# Patient Record
Sex: Female | Born: 1945 | Race: Black or African American | Hispanic: No | Smoking: Former smoker
Health system: Southern US, Community
[De-identification: ages and names within clinical notes are randomized; demographics above are authoritative.]

## PROBLEM LIST (undated history)

## (undated) DIAGNOSIS — N183 Chronic kidney disease, stage 3 unspecified: Secondary | ICD-10-CM

## (undated) DIAGNOSIS — I82409 Acute embolism and thrombosis of unspecified deep veins of unspecified lower extremity: Secondary | ICD-10-CM

## (undated) DIAGNOSIS — Z960 Presence of urogenital implants: Secondary | ICD-10-CM

## (undated) DIAGNOSIS — M109 Gout, unspecified: Secondary | ICD-10-CM

## (undated) DIAGNOSIS — R32 Unspecified urinary incontinence: Secondary | ICD-10-CM

## (undated) DIAGNOSIS — I2699 Other pulmonary embolism without acute cor pulmonale: Secondary | ICD-10-CM

## (undated) DIAGNOSIS — Z978 Presence of other specified devices: Secondary | ICD-10-CM

## (undated) DIAGNOSIS — J189 Pneumonia, unspecified organism: Secondary | ICD-10-CM

## (undated) DIAGNOSIS — R011 Cardiac murmur, unspecified: Secondary | ICD-10-CM

## (undated) DIAGNOSIS — G629 Polyneuropathy, unspecified: Secondary | ICD-10-CM

## (undated) DIAGNOSIS — E785 Hyperlipidemia, unspecified: Secondary | ICD-10-CM

## (undated) DIAGNOSIS — I509 Heart failure, unspecified: Secondary | ICD-10-CM

## (undated) DIAGNOSIS — G43909 Migraine, unspecified, not intractable, without status migrainosus: Secondary | ICD-10-CM

## (undated) DIAGNOSIS — D649 Anemia, unspecified: Secondary | ICD-10-CM

## (undated) DIAGNOSIS — F32A Depression, unspecified: Secondary | ICD-10-CM

## (undated) DIAGNOSIS — I890 Lymphedema, not elsewhere classified: Secondary | ICD-10-CM

## (undated) DIAGNOSIS — M1990 Unspecified osteoarthritis, unspecified site: Secondary | ICD-10-CM

## (undated) DIAGNOSIS — G4730 Sleep apnea, unspecified: Secondary | ICD-10-CM

## (undated) DIAGNOSIS — F329 Major depressive disorder, single episode, unspecified: Secondary | ICD-10-CM

## (undated) DIAGNOSIS — I10 Essential (primary) hypertension: Secondary | ICD-10-CM

## (undated) DIAGNOSIS — Z9289 Personal history of other medical treatment: Secondary | ICD-10-CM

## (undated) DIAGNOSIS — K219 Gastro-esophageal reflux disease without esophagitis: Secondary | ICD-10-CM

## (undated) HISTORY — DX: Depression, unspecified: F32.A

## (undated) HISTORY — PX: SHOULDER OPEN ROTATOR CUFF REPAIR: SHX2407

## (undated) HISTORY — DX: Unspecified osteoarthritis, unspecified site: M19.90

## (undated) HISTORY — DX: Sleep apnea, unspecified: G47.30

## (undated) HISTORY — DX: Hyperlipidemia, unspecified: E78.5

## (undated) HISTORY — PX: KNEE ARTHROSCOPY: SHX127

## (undated) HISTORY — DX: Major depressive disorder, single episode, unspecified: F32.9

## (undated) HISTORY — PX: TEMPORAL ARTERY BIOPSY / LIGATION: SUR132

---

## 1997-12-03 ENCOUNTER — Encounter: Admission: RE | Admit: 1997-12-03 | Discharge: 1998-03-03 | Payer: Self-pay | Admitting: Orthopedic Surgery

## 1998-11-23 ENCOUNTER — Other Ambulatory Visit: Admission: RE | Admit: 1998-11-23 | Discharge: 1998-11-23 | Payer: Self-pay | Admitting: Obstetrics and Gynecology

## 1998-12-02 ENCOUNTER — Emergency Department (HOSPITAL_COMMUNITY): Admission: EM | Admit: 1998-12-02 | Discharge: 1998-12-02 | Payer: Self-pay | Admitting: Emergency Medicine

## 1998-12-02 ENCOUNTER — Encounter: Payer: Self-pay | Admitting: Emergency Medicine

## 1998-12-21 ENCOUNTER — Ambulatory Visit (HOSPITAL_COMMUNITY): Admission: RE | Admit: 1998-12-21 | Discharge: 1998-12-21 | Payer: Self-pay | Admitting: Internal Medicine

## 1998-12-21 ENCOUNTER — Encounter: Payer: Self-pay | Admitting: Internal Medicine

## 1999-01-09 ENCOUNTER — Ambulatory Visit (HOSPITAL_COMMUNITY): Admission: RE | Admit: 1999-01-09 | Discharge: 1999-01-09 | Payer: Self-pay | Admitting: Obstetrics and Gynecology

## 1999-01-09 ENCOUNTER — Encounter: Payer: Self-pay | Admitting: Obstetrics and Gynecology

## 1999-02-14 ENCOUNTER — Emergency Department (HOSPITAL_COMMUNITY): Admission: EM | Admit: 1999-02-14 | Discharge: 1999-02-14 | Payer: Self-pay | Admitting: Emergency Medicine

## 1999-05-12 ENCOUNTER — Ambulatory Visit (HOSPITAL_COMMUNITY): Admission: RE | Admit: 1999-05-12 | Discharge: 1999-05-12 | Payer: Self-pay | Admitting: *Deleted

## 1999-07-27 ENCOUNTER — Ambulatory Visit (HOSPITAL_COMMUNITY): Admission: RE | Admit: 1999-07-27 | Discharge: 1999-07-27 | Payer: Self-pay | Admitting: *Deleted

## 1999-07-27 ENCOUNTER — Encounter: Payer: Self-pay | Admitting: *Deleted

## 2000-01-15 ENCOUNTER — Encounter: Payer: Self-pay | Admitting: Orthopedic Surgery

## 2000-01-16 ENCOUNTER — Ambulatory Visit (HOSPITAL_COMMUNITY): Admission: RE | Admit: 2000-01-16 | Discharge: 2000-01-17 | Payer: Self-pay | Admitting: Orthopedic Surgery

## 2000-12-17 ENCOUNTER — Encounter: Admission: RE | Admit: 2000-12-17 | Discharge: 2000-12-17 | Payer: Self-pay | Admitting: Obstetrics & Gynecology

## 2000-12-24 ENCOUNTER — Ambulatory Visit (HOSPITAL_COMMUNITY): Admission: RE | Admit: 2000-12-24 | Discharge: 2000-12-24 | Payer: Self-pay | Admitting: Obstetrics & Gynecology

## 2002-11-15 ENCOUNTER — Encounter: Payer: Self-pay | Admitting: Emergency Medicine

## 2002-11-15 ENCOUNTER — Inpatient Hospital Stay (HOSPITAL_COMMUNITY): Admission: EM | Admit: 2002-11-15 | Discharge: 2002-11-17 | Payer: Self-pay | Admitting: Emergency Medicine

## 2002-11-16 ENCOUNTER — Encounter (INDEPENDENT_AMBULATORY_CARE_PROVIDER_SITE_OTHER): Payer: Self-pay | Admitting: *Deleted

## 2002-12-10 ENCOUNTER — Inpatient Hospital Stay (HOSPITAL_COMMUNITY): Admission: AD | Admit: 2002-12-10 | Discharge: 2002-12-14 | Payer: Self-pay | Admitting: Internal Medicine

## 2003-04-16 ENCOUNTER — Ambulatory Visit (HOSPITAL_COMMUNITY): Admission: RE | Admit: 2003-04-16 | Discharge: 2003-04-16 | Payer: Self-pay | Admitting: *Deleted

## 2003-05-06 ENCOUNTER — Inpatient Hospital Stay (HOSPITAL_COMMUNITY): Admission: EM | Admit: 2003-05-06 | Discharge: 2003-05-15 | Payer: Self-pay | Admitting: Emergency Medicine

## 2003-05-06 ENCOUNTER — Encounter: Payer: Self-pay | Admitting: Emergency Medicine

## 2003-05-07 ENCOUNTER — Encounter: Payer: Self-pay | Admitting: Cardiology

## 2003-05-08 ENCOUNTER — Encounter: Payer: Self-pay | Admitting: *Deleted

## 2003-05-18 ENCOUNTER — Ambulatory Visit (HOSPITAL_COMMUNITY): Admission: RE | Admit: 2003-05-18 | Discharge: 2003-05-18 | Payer: Self-pay | Admitting: *Deleted

## 2004-10-11 ENCOUNTER — Emergency Department (HOSPITAL_COMMUNITY): Admission: EM | Admit: 2004-10-11 | Discharge: 2004-10-11 | Payer: Self-pay | Admitting: Emergency Medicine

## 2005-03-16 ENCOUNTER — Encounter (INDEPENDENT_AMBULATORY_CARE_PROVIDER_SITE_OTHER): Payer: Self-pay | Admitting: *Deleted

## 2005-03-16 ENCOUNTER — Ambulatory Visit (HOSPITAL_COMMUNITY): Admission: RE | Admit: 2005-03-16 | Discharge: 2005-03-16 | Payer: Self-pay | Admitting: Surgery

## 2005-03-22 ENCOUNTER — Ambulatory Visit (HOSPITAL_COMMUNITY): Admission: RE | Admit: 2005-03-22 | Discharge: 2005-03-22 | Payer: Self-pay | Admitting: Surgery

## 2005-04-01 ENCOUNTER — Emergency Department (HOSPITAL_COMMUNITY): Admission: EM | Admit: 2005-04-01 | Discharge: 2005-04-01 | Payer: Self-pay | Admitting: Emergency Medicine

## 2005-04-14 ENCOUNTER — Emergency Department (HOSPITAL_COMMUNITY): Admission: EM | Admit: 2005-04-14 | Discharge: 2005-04-14 | Payer: Self-pay | Admitting: Emergency Medicine

## 2005-04-27 ENCOUNTER — Encounter (HOSPITAL_BASED_OUTPATIENT_CLINIC_OR_DEPARTMENT_OTHER): Admission: RE | Admit: 2005-04-27 | Discharge: 2005-06-19 | Payer: Self-pay | Admitting: Surgery

## 2005-05-10 ENCOUNTER — Inpatient Hospital Stay (HOSPITAL_COMMUNITY): Admission: EM | Admit: 2005-05-10 | Discharge: 2005-05-13 | Payer: Self-pay | Admitting: Emergency Medicine

## 2005-07-16 ENCOUNTER — Ambulatory Visit (HOSPITAL_COMMUNITY): Admission: RE | Admit: 2005-07-16 | Discharge: 2005-07-16 | Payer: Self-pay | Admitting: Internal Medicine

## 2006-01-10 ENCOUNTER — Ambulatory Visit: Payer: Self-pay | Admitting: Internal Medicine

## 2006-01-23 ENCOUNTER — Ambulatory Visit: Payer: Self-pay | Admitting: Internal Medicine

## 2006-01-28 ENCOUNTER — Inpatient Hospital Stay (HOSPITAL_COMMUNITY): Admission: AD | Admit: 2006-01-28 | Discharge: 2006-02-01 | Payer: Self-pay | Admitting: Internal Medicine

## 2006-01-28 ENCOUNTER — Ambulatory Visit: Payer: Self-pay | Admitting: Internal Medicine

## 2006-04-26 ENCOUNTER — Ambulatory Visit: Payer: Self-pay | Admitting: Internal Medicine

## 2008-05-20 ENCOUNTER — Ambulatory Visit: Payer: Self-pay | Admitting: Internal Medicine

## 2008-05-20 ENCOUNTER — Inpatient Hospital Stay (HOSPITAL_COMMUNITY): Admission: EM | Admit: 2008-05-20 | Discharge: 2008-05-28 | Payer: Self-pay | Admitting: Emergency Medicine

## 2008-05-24 ENCOUNTER — Encounter (INDEPENDENT_AMBULATORY_CARE_PROVIDER_SITE_OTHER): Payer: Self-pay | Admitting: Internal Medicine

## 2008-05-25 ENCOUNTER — Ambulatory Visit: Payer: Self-pay | Admitting: Vascular Surgery

## 2008-05-25 ENCOUNTER — Encounter (INDEPENDENT_AMBULATORY_CARE_PROVIDER_SITE_OTHER): Payer: Self-pay | Admitting: Internal Medicine

## 2008-06-25 ENCOUNTER — Emergency Department (HOSPITAL_COMMUNITY): Admission: EM | Admit: 2008-06-25 | Discharge: 2008-06-25 | Payer: Self-pay | Admitting: Emergency Medicine

## 2008-11-02 ENCOUNTER — Inpatient Hospital Stay (HOSPITAL_COMMUNITY): Admission: EM | Admit: 2008-11-02 | Discharge: 2008-11-12 | Payer: Self-pay | Admitting: Emergency Medicine

## 2008-11-03 ENCOUNTER — Encounter (INDEPENDENT_AMBULATORY_CARE_PROVIDER_SITE_OTHER): Payer: Self-pay | Admitting: Internal Medicine

## 2008-11-03 ENCOUNTER — Ambulatory Visit: Payer: Self-pay | Admitting: Vascular Surgery

## 2008-12-09 ENCOUNTER — Ambulatory Visit: Payer: Self-pay | Admitting: Emergency Medicine

## 2008-12-09 DIAGNOSIS — E785 Hyperlipidemia, unspecified: Secondary | ICD-10-CM

## 2008-12-09 DIAGNOSIS — G4730 Sleep apnea, unspecified: Secondary | ICD-10-CM | POA: Insufficient documentation

## 2008-12-09 DIAGNOSIS — I2789 Other specified pulmonary heart diseases: Secondary | ICD-10-CM | POA: Insufficient documentation

## 2008-12-09 DIAGNOSIS — I10 Essential (primary) hypertension: Secondary | ICD-10-CM

## 2008-12-22 ENCOUNTER — Ambulatory Visit: Payer: Self-pay | Admitting: Pulmonary Disease

## 2008-12-22 ENCOUNTER — Ambulatory Visit (HOSPITAL_BASED_OUTPATIENT_CLINIC_OR_DEPARTMENT_OTHER): Admission: RE | Admit: 2008-12-22 | Discharge: 2008-12-22 | Payer: Self-pay | Admitting: Emergency Medicine

## 2009-01-22 ENCOUNTER — Inpatient Hospital Stay (HOSPITAL_COMMUNITY): Admission: EM | Admit: 2009-01-22 | Discharge: 2009-01-25 | Payer: Self-pay | Admitting: Emergency Medicine

## 2009-01-25 ENCOUNTER — Ambulatory Visit: Payer: Self-pay | Admitting: Vascular Surgery

## 2009-01-25 ENCOUNTER — Encounter (INDEPENDENT_AMBULATORY_CARE_PROVIDER_SITE_OTHER): Payer: Self-pay | Admitting: Internal Medicine

## 2009-01-28 ENCOUNTER — Ambulatory Visit: Payer: Self-pay | Admitting: Emergency Medicine

## 2009-01-30 ENCOUNTER — Telehealth: Payer: Self-pay | Admitting: Emergency Medicine

## 2009-02-03 ENCOUNTER — Telehealth (INDEPENDENT_AMBULATORY_CARE_PROVIDER_SITE_OTHER): Payer: Self-pay | Admitting: *Deleted

## 2009-09-22 ENCOUNTER — Emergency Department (HOSPITAL_COMMUNITY): Admission: EM | Admit: 2009-09-22 | Discharge: 2009-09-22 | Payer: Self-pay | Admitting: Emergency Medicine

## 2009-10-31 ENCOUNTER — Encounter: Admission: RE | Admit: 2009-10-31 | Discharge: 2009-10-31 | Payer: Self-pay | Admitting: Geriatric Medicine

## 2009-11-08 ENCOUNTER — Inpatient Hospital Stay (HOSPITAL_COMMUNITY): Admission: EM | Admit: 2009-11-08 | Discharge: 2009-11-10 | Payer: Self-pay | Admitting: Emergency Medicine

## 2009-11-08 ENCOUNTER — Encounter (INDEPENDENT_AMBULATORY_CARE_PROVIDER_SITE_OTHER): Payer: Self-pay | Admitting: Internal Medicine

## 2009-11-08 ENCOUNTER — Ambulatory Visit: Payer: Self-pay | Admitting: Surgery

## 2009-11-14 ENCOUNTER — Observation Stay (HOSPITAL_COMMUNITY): Admission: EM | Admit: 2009-11-14 | Discharge: 2009-11-18 | Payer: Self-pay | Admitting: Emergency Medicine

## 2009-11-15 ENCOUNTER — Encounter (INDEPENDENT_AMBULATORY_CARE_PROVIDER_SITE_OTHER): Payer: Self-pay | Admitting: Internal Medicine

## 2010-05-14 ENCOUNTER — Observation Stay (HOSPITAL_COMMUNITY): Admission: EM | Admit: 2010-05-14 | Discharge: 2010-05-17 | Payer: Self-pay | Admitting: Emergency Medicine

## 2010-05-14 ENCOUNTER — Ambulatory Visit: Payer: Self-pay | Admitting: Family Medicine

## 2010-05-15 ENCOUNTER — Encounter: Payer: Self-pay | Admitting: Family Medicine

## 2010-08-01 ENCOUNTER — Inpatient Hospital Stay (HOSPITAL_COMMUNITY)
Admission: EM | Admit: 2010-08-01 | Discharge: 2010-08-05 | Payer: Self-pay | Source: Home / Self Care | Attending: Internal Medicine | Admitting: Internal Medicine

## 2010-09-17 ENCOUNTER — Encounter: Payer: Self-pay | Admitting: Geriatric Medicine

## 2010-11-06 LAB — GLUCOSE, CAPILLARY
Glucose-Capillary: 126 mg/dL — ABNORMAL HIGH (ref 70–99)
Glucose-Capillary: 130 mg/dL — ABNORMAL HIGH (ref 70–99)
Glucose-Capillary: 134 mg/dL — ABNORMAL HIGH (ref 70–99)
Glucose-Capillary: 136 mg/dL — ABNORMAL HIGH (ref 70–99)
Glucose-Capillary: 185 mg/dL — ABNORMAL HIGH (ref 70–99)
Glucose-Capillary: 197 mg/dL — ABNORMAL HIGH (ref 70–99)
Glucose-Capillary: 198 mg/dL — ABNORMAL HIGH (ref 70–99)
Glucose-Capillary: 82 mg/dL (ref 70–99)
Glucose-Capillary: 97 mg/dL (ref 70–99)

## 2010-11-06 LAB — HEMOGLOBIN A1C: Hgb A1c MFr Bld: 6.3 % — ABNORMAL HIGH (ref <5.7)

## 2010-11-06 LAB — URINALYSIS, ROUTINE W REFLEX MICROSCOPIC
Glucose, UA: NEGATIVE mg/dL
Specific Gravity, Urine: 1.019 (ref 1.005–1.030)

## 2010-11-06 LAB — BASIC METABOLIC PANEL
BUN: 25 mg/dL — ABNORMAL HIGH (ref 6–23)
CO2: 22 mEq/L (ref 19–32)
Calcium: 7.7 mg/dL — ABNORMAL LOW (ref 8.4–10.5)
Calcium: 7.8 mg/dL — ABNORMAL LOW (ref 8.4–10.5)
Calcium: 8.1 mg/dL — ABNORMAL LOW (ref 8.4–10.5)
Chloride: 105 mEq/L (ref 96–112)
Chloride: 107 mEq/L (ref 96–112)
Creatinine, Ser: 1.73 mg/dL — ABNORMAL HIGH (ref 0.4–1.2)
Creatinine, Ser: 2.02 mg/dL — ABNORMAL HIGH (ref 0.4–1.2)
Creatinine, Ser: 2.23 mg/dL — ABNORMAL HIGH (ref 0.4–1.2)
GFR calc Af Amer: 27 mL/min — ABNORMAL LOW (ref >60)
GFR calc Af Amer: 30 mL/min — ABNORMAL LOW (ref >60)
GFR calc Af Amer: 36 mL/min — ABNORMAL LOW (ref >60)
GFR calc non Af Amer: 25 mL/min — ABNORMAL LOW (ref >60)
GFR calc non Af Amer: 30 mL/min — ABNORMAL LOW (ref >60)
Glucose, Bld: 128 mg/dL — ABNORMAL HIGH (ref 70–99)
Potassium: 4.4 mEq/L (ref 3.5–5.1)
Sodium: 138 mEq/L (ref 135–145)
Sodium: 139 mEq/L (ref 135–145)

## 2010-11-06 LAB — CBC
HCT: 25.4 % — ABNORMAL LOW (ref 36.0–46.0)
Hemoglobin: 7.9 g/dL — ABNORMAL LOW (ref 12.0–15.0)
MCH: 27.3 pg (ref 26.0–34.0)
MCHC: 31.9 g/dL (ref 30.0–36.0)
MCV: 86.1 fL (ref 78.0–100.0)
Platelets: 191 10*3/uL (ref 150–400)
Platelets: 222 10*3/uL (ref 150–400)
Platelets: 230 10*3/uL (ref 150–400)
RBC: 2.89 MIL/uL — ABNORMAL LOW (ref 3.87–5.11)
RBC: 2.9 MIL/uL — ABNORMAL LOW (ref 3.87–5.11)
RDW: 14.3 % (ref 11.5–15.5)
RDW: 14.5 % (ref 11.5–15.5)
RDW: 14.6 % (ref 11.5–15.5)
WBC: 10.4 10*3/uL (ref 4.0–10.5)
WBC: 16.6 10*3/uL — ABNORMAL HIGH (ref 4.0–10.5)
WBC: 9.6 10*3/uL (ref 4.0–10.5)

## 2010-11-06 LAB — CULTURE, BLOOD (ROUTINE X 2): Culture: NO GROWTH

## 2010-11-06 LAB — COMPREHENSIVE METABOLIC PANEL
ALT: 29 U/L (ref 0–35)
Calcium: 7.3 mg/dL — ABNORMAL LOW (ref 8.4–10.5)
Creatinine, Ser: 2.1 mg/dL — ABNORMAL HIGH (ref 0.4–1.2)
GFR calc Af Amer: 29 mL/min — ABNORMAL LOW (ref >60)
Glucose, Bld: 129 mg/dL — ABNORMAL HIGH (ref 70–99)
Sodium: 136 mEq/L (ref 135–145)
Total Protein: 6.1 g/dL (ref 6.0–8.3)

## 2010-11-06 LAB — URINE MICROSCOPIC-ADD ON

## 2010-11-06 LAB — DIFFERENTIAL
Basophils Absolute: 0 10*3/uL (ref 0.0–0.1)
Eosinophils Relative: 2 % (ref 0–5)
Lymphocytes Relative: 11 % — ABNORMAL LOW (ref 12–46)

## 2010-11-06 LAB — URINE CULTURE

## 2010-11-06 LAB — MAGNESIUM: Magnesium: 1.4 mg/dL — ABNORMAL LOW (ref 1.5–2.5)

## 2010-11-09 LAB — BASIC METABOLIC PANEL
BUN: 44 mg/dL — ABNORMAL HIGH (ref 6–23)
CO2: 22 mEq/L (ref 19–32)
CO2: 22 mEq/L (ref 19–32)
CO2: 22 mEq/L (ref 19–32)
Calcium: 8.7 mg/dL (ref 8.4–10.5)
Calcium: 8.9 mg/dL (ref 8.4–10.5)
Calcium: 9 mg/dL (ref 8.4–10.5)
Chloride: 112 mEq/L (ref 96–112)
Chloride: 114 mEq/L — ABNORMAL HIGH (ref 96–112)
Creatinine, Ser: 1.24 mg/dL — ABNORMAL HIGH (ref 0.4–1.2)
Creatinine, Ser: 1.34 mg/dL — ABNORMAL HIGH (ref 0.4–1.2)
Creatinine, Ser: 1.72 mg/dL — ABNORMAL HIGH (ref 0.4–1.2)
GFR calc Af Amer: 38 mL/min — ABNORMAL LOW (ref >60)
GFR calc Af Amer: 48 mL/min — ABNORMAL LOW (ref >60)
GFR calc non Af Amer: 30 mL/min — ABNORMAL LOW (ref >60)
GFR calc non Af Amer: 40 mL/min — ABNORMAL LOW (ref >60)
GFR calc non Af Amer: 44 mL/min — ABNORMAL LOW (ref >60)
Glucose, Bld: 75 mg/dL (ref 70–99)
Sodium: 137 mEq/L (ref 135–145)
Sodium: 138 mEq/L (ref 135–145)
Sodium: 138 mEq/L (ref 135–145)

## 2010-11-09 LAB — VITAMIN D 25 HYDROXY (VIT D DEFICIENCY, FRACTURES): Vit D, 25-Hydroxy: 25 ng/mL — ABNORMAL LOW (ref 30–89)

## 2010-11-09 LAB — CBC
HCT: 32.7 % — ABNORMAL LOW (ref 36.0–46.0)
Hemoglobin: 10.5 g/dL — ABNORMAL LOW (ref 12.0–15.0)
Hemoglobin: 10.9 g/dL — ABNORMAL LOW (ref 12.0–15.0)
MCH: 27.3 pg (ref 26.0–34.0)
MCH: 27.5 pg (ref 26.0–34.0)
MCHC: 32.1 g/dL (ref 30.0–36.0)
MCV: 84.9 fL (ref 78.0–100.0)
MCV: 86 fL (ref 78.0–100.0)
Platelets: 201 10*3/uL (ref 150–400)
Platelets: 202 10*3/uL (ref 150–400)
Platelets: 222 10*3/uL (ref 150–400)
Platelets: 222 10*3/uL (ref 150–400)
RBC: 3.84 MIL/uL — ABNORMAL LOW (ref 3.87–5.11)
RBC: 3.85 MIL/uL — ABNORMAL LOW (ref 3.87–5.11)
RBC: 3.97 MIL/uL (ref 3.87–5.11)
RDW: 15.3 % (ref 11.5–15.5)
RDW: 15.3 % (ref 11.5–15.5)
WBC: 4.3 10*3/uL (ref 4.0–10.5)
WBC: 4.8 10*3/uL (ref 4.0–10.5)
WBC: 4.9 10*3/uL (ref 4.0–10.5)
WBC: 5.1 10*3/uL (ref 4.0–10.5)

## 2010-11-09 LAB — CK TOTAL AND CKMB (NOT AT ARMC)
CK, MB: 1.8 ng/mL (ref 0.3–4.0)
Relative Index: 1.4 (ref 0.0–2.5)

## 2010-11-09 LAB — CARDIAC PANEL(CRET KIN+CKTOT+MB+TROPI)
CK, MB: 1.7 ng/mL (ref 0.3–4.0)
CK, MB: 1.7 ng/mL (ref 0.3–4.0)
Relative Index: 1.3 (ref 0.0–2.5)
Relative Index: 1.3 (ref 0.0–2.5)
Total CK: 131 U/L (ref 7–177)
Total CK: 135 U/L (ref 7–177)
Troponin I: 0.01 ng/mL (ref 0.00–0.06)

## 2010-11-09 LAB — GLUCOSE, CAPILLARY
Glucose-Capillary: 102 mg/dL — ABNORMAL HIGH (ref 70–99)
Glucose-Capillary: 111 mg/dL — ABNORMAL HIGH (ref 70–99)
Glucose-Capillary: 133 mg/dL — ABNORMAL HIGH (ref 70–99)
Glucose-Capillary: 134 mg/dL — ABNORMAL HIGH (ref 70–99)
Glucose-Capillary: 58 mg/dL — ABNORMAL LOW (ref 70–99)
Glucose-Capillary: 67 mg/dL — ABNORMAL LOW (ref 70–99)
Glucose-Capillary: 89 mg/dL (ref 70–99)
Glucose-Capillary: 96 mg/dL (ref 70–99)
Glucose-Capillary: 98 mg/dL (ref 70–99)

## 2010-11-09 LAB — HEPARIN LEVEL (UNFRACTIONATED)
Heparin Unfractionated: 0.5 IU/mL (ref 0.30–0.70)
Heparin Unfractionated: 0.68 IU/mL (ref 0.30–0.70)

## 2010-11-09 LAB — BRAIN NATRIURETIC PEPTIDE
Pro B Natriuretic peptide (BNP): 35 pg/mL (ref 0.0–100.0)
Pro B Natriuretic peptide (BNP): <30 pg/mL (ref 0.0–100.0)

## 2010-11-09 LAB — POCT CARDIAC MARKERS

## 2010-11-09 LAB — DIFFERENTIAL
Basophils Absolute: 0 10*3/uL (ref 0.0–0.1)
Eosinophils Absolute: 0.1 10*3/uL (ref 0.0–0.7)
Eosinophils Relative: 3 % (ref 0–5)
Lymphocytes Relative: 24 % (ref 12–46)
Neutrophils Relative %: 67 % (ref 43–77)

## 2010-11-09 LAB — COMPREHENSIVE METABOLIC PANEL
AST: 14 U/L (ref 0–37)
Albumin: 2.7 g/dL — ABNORMAL LOW (ref 3.5–5.2)
Alkaline Phosphatase: 83 U/L (ref 39–117)
Chloride: 116 mEq/L — ABNORMAL HIGH (ref 96–112)
Creatinine, Ser: 1.42 mg/dL — ABNORMAL HIGH (ref 0.4–1.2)
GFR calc Af Amer: 45 mL/min — ABNORMAL LOW (ref >60)
Potassium: 4.9 mEq/L (ref 3.5–5.1)
Total Bilirubin: 0.2 mg/dL — ABNORMAL LOW (ref 0.3–1.2)

## 2010-11-09 LAB — PROTIME-INR
INR: 1.11 (ref 0.00–1.49)
Prothrombin Time: 14.5 seconds (ref 11.6–15.2)

## 2010-11-09 LAB — HEMOGLOBIN A1C: Mean Plasma Glucose: 137 mg/dL — ABNORMAL HIGH (ref <117)

## 2010-11-09 LAB — D-DIMER, QUANTITATIVE: D-Dimer, Quant: 0.57 ug/mL-FEU — ABNORMAL HIGH (ref 0.00–0.48)

## 2010-11-09 LAB — LIPID PANEL
LDL Cholesterol: 70 mg/dL (ref 0–99)
Triglycerides: 100 mg/dL (ref <150)

## 2010-11-09 LAB — TROPONIN I: Troponin I: 0.02 ng/mL (ref 0.00–0.06)

## 2010-11-12 LAB — DIFFERENTIAL
Basophils Absolute: 0.1 K/uL (ref 0.0–0.1)
Basophils Relative: 2 % — ABNORMAL HIGH (ref 0–1)
Eosinophils Absolute: 0.3 K/uL (ref 0.0–0.7)
Eosinophils Relative: 4 % (ref 0–5)
Lymphocytes Relative: 16 % (ref 12–46)
Lymphs Abs: 1.1 K/uL (ref 0.7–4.0)
Monocytes Absolute: 0.3 K/uL (ref 0.1–1.0)
Monocytes Relative: 4 % (ref 3–12)
Neutro Abs: 5.4 K/uL (ref 1.7–7.7)
Neutrophils Relative %: 75 % (ref 43–77)

## 2010-11-12 LAB — CBC
HCT: 32.7 % — ABNORMAL LOW (ref 36.0–46.0)
Hemoglobin: 11 g/dL — ABNORMAL LOW (ref 12.0–15.0)
MCHC: 33.7 g/dL (ref 30.0–36.0)
MCV: 82.9 fL (ref 78.0–100.0)
Platelets: 327 K/uL (ref 150–400)
RBC: 3.94 MIL/uL (ref 3.87–5.11)
RDW: 13.6 % (ref 11.5–15.5)
WBC: 7.2 K/uL (ref 4.0–10.5)

## 2010-11-12 LAB — COMPREHENSIVE METABOLIC PANEL WITH GFR
ALT: 13 U/L (ref 0–35)
AST: 13 U/L (ref 0–37)
Albumin: 3 g/dL — ABNORMAL LOW (ref 3.5–5.2)
Alkaline Phosphatase: 82 U/L (ref 39–117)
BUN: 42 mg/dL — ABNORMAL HIGH (ref 6–23)
CO2: 24 meq/L (ref 19–32)
Calcium: 9.2 mg/dL (ref 8.4–10.5)
Chloride: 109 meq/L (ref 96–112)
Creatinine, Ser: 1.46 mg/dL — ABNORMAL HIGH (ref 0.4–1.2)
GFR calc non Af Amer: 36 mL/min — ABNORMAL LOW
Glucose, Bld: 94 mg/dL (ref 70–99)
Potassium: 4.8 meq/L (ref 3.5–5.1)
Sodium: 141 meq/L (ref 135–145)
Total Bilirubin: 0.3 mg/dL (ref 0.3–1.2)
Total Protein: 8.5 g/dL — ABNORMAL HIGH (ref 6.0–8.3)

## 2010-11-12 LAB — URINALYSIS, ROUTINE W REFLEX MICROSCOPIC
Glucose, UA: NEGATIVE mg/dL
Nitrite: NEGATIVE
Protein, ur: NEGATIVE mg/dL
Urobilinogen, UA: 0.2 mg/dL (ref 0.0–1.0)

## 2010-11-19 LAB — GLUCOSE, CAPILLARY
Glucose-Capillary: 103 mg/dL — ABNORMAL HIGH (ref 70–99)
Glucose-Capillary: 104 mg/dL — ABNORMAL HIGH (ref 70–99)
Glucose-Capillary: 108 mg/dL — ABNORMAL HIGH (ref 70–99)
Glucose-Capillary: 116 mg/dL — ABNORMAL HIGH (ref 70–99)
Glucose-Capillary: 128 mg/dL — ABNORMAL HIGH (ref 70–99)
Glucose-Capillary: 151 mg/dL — ABNORMAL HIGH (ref 70–99)
Glucose-Capillary: 153 mg/dL — ABNORMAL HIGH (ref 70–99)
Glucose-Capillary: 155 mg/dL — ABNORMAL HIGH (ref 70–99)
Glucose-Capillary: 163 mg/dL — ABNORMAL HIGH (ref 70–99)
Glucose-Capillary: 164 mg/dL — ABNORMAL HIGH (ref 70–99)
Glucose-Capillary: 172 mg/dL — ABNORMAL HIGH (ref 70–99)
Glucose-Capillary: 187 mg/dL — ABNORMAL HIGH (ref 70–99)
Glucose-Capillary: 63 mg/dL — ABNORMAL LOW (ref 70–99)
Glucose-Capillary: 66 mg/dL — ABNORMAL LOW (ref 70–99)
Glucose-Capillary: 73 mg/dL (ref 70–99)
Glucose-Capillary: 91 mg/dL (ref 70–99)
Glucose-Capillary: 97 mg/dL (ref 70–99)

## 2010-11-19 LAB — COMPREHENSIVE METABOLIC PANEL
ALT: 21 U/L (ref 0–35)
AST: 10 U/L (ref 0–37)
AST: 12 U/L (ref 0–37)
AST: 13 U/L (ref 0–37)
AST: 16 U/L (ref 0–37)
Albumin: 2.2 g/dL — ABNORMAL LOW (ref 3.5–5.2)
Albumin: 2.2 g/dL — ABNORMAL LOW (ref 3.5–5.2)
Albumin: 2.5 g/dL — ABNORMAL LOW (ref 3.5–5.2)
Alkaline Phosphatase: 87 U/L (ref 39–117)
BUN: 28 mg/dL — ABNORMAL HIGH (ref 6–23)
BUN: 34 mg/dL — ABNORMAL HIGH (ref 6–23)
BUN: 40 mg/dL — ABNORMAL HIGH (ref 6–23)
CO2: 19 mEq/L (ref 19–32)
CO2: 22 mEq/L (ref 19–32)
CO2: 22 mEq/L (ref 19–32)
Calcium: 8.1 mg/dL — ABNORMAL LOW (ref 8.4–10.5)
Calcium: 8.6 mg/dL (ref 8.4–10.5)
Calcium: 8.8 mg/dL (ref 8.4–10.5)
Chloride: 105 mEq/L (ref 96–112)
Chloride: 111 mEq/L (ref 96–112)
Chloride: 111 mEq/L (ref 96–112)
Creatinine, Ser: 1.43 mg/dL — ABNORMAL HIGH (ref 0.4–1.2)
Creatinine, Ser: 1.49 mg/dL — ABNORMAL HIGH (ref 0.4–1.2)
Creatinine, Ser: 1.57 mg/dL — ABNORMAL HIGH (ref 0.4–1.2)
Creatinine, Ser: 1.67 mg/dL — ABNORMAL HIGH (ref 0.4–1.2)
GFR calc Af Amer: 38 mL/min — ABNORMAL LOW (ref >60)
GFR calc Af Amer: 43 mL/min — ABNORMAL LOW (ref >60)
GFR calc Af Amer: 48 mL/min — ABNORMAL LOW (ref >60)
GFR calc non Af Amer: 31 mL/min — ABNORMAL LOW (ref >60)
GFR calc non Af Amer: 33 mL/min — ABNORMAL LOW (ref >60)
GFR calc non Af Amer: 37 mL/min — ABNORMAL LOW (ref >60)
Glucose, Bld: 133 mg/dL — ABNORMAL HIGH (ref 70–99)
Glucose, Bld: 64 mg/dL — ABNORMAL LOW (ref 70–99)
Potassium: 4.4 mEq/L (ref 3.5–5.1)
Sodium: 137 mEq/L (ref 135–145)
Sodium: 140 mEq/L (ref 135–145)
Total Bilirubin: 0.3 mg/dL (ref 0.3–1.2)
Total Bilirubin: 0.3 mg/dL (ref 0.3–1.2)
Total Bilirubin: 0.6 mg/dL (ref 0.3–1.2)
Total Protein: 7.3 g/dL (ref 6.0–8.3)
Total Protein: 7.3 g/dL (ref 6.0–8.3)

## 2010-11-19 LAB — CBC
HCT: 23.8 % — ABNORMAL LOW (ref 36.0–46.0)
HCT: 24.9 % — ABNORMAL LOW (ref 36.0–46.0)
HCT: 26.1 % — ABNORMAL LOW (ref 36.0–46.0)
HCT: 26.6 % — ABNORMAL LOW (ref 36.0–46.0)
HCT: 28.3 % — ABNORMAL LOW (ref 36.0–46.0)
Hemoglobin: 7.8 g/dL — ABNORMAL LOW (ref 12.0–15.0)
Hemoglobin: 8.4 g/dL — ABNORMAL LOW (ref 12.0–15.0)
Hemoglobin: 8.4 g/dL — ABNORMAL LOW (ref 12.0–15.0)
Hemoglobin: 8.5 g/dL — ABNORMAL LOW (ref 12.0–15.0)
MCHC: 32.8 g/dL (ref 30.0–36.0)
MCHC: 33.5 g/dL (ref 30.0–36.0)
MCHC: 33.8 g/dL (ref 30.0–36.0)
MCV: 81.9 fL (ref 78.0–100.0)
MCV: 82.1 fL (ref 78.0–100.0)
MCV: 82.7 fL (ref 78.0–100.0)
MCV: 83.1 fL (ref 78.0–100.0)
MCV: 83.1 fL (ref 78.0–100.0)
Platelets: 188 10*3/uL (ref 150–400)
Platelets: 345 10*3/uL (ref 150–400)
Platelets: 371 10*3/uL (ref 150–400)
Platelets: 407 10*3/uL — ABNORMAL HIGH (ref 150–400)
RBC: 2.87 MIL/uL — ABNORMAL LOW (ref 3.87–5.11)
RBC: 3.04 MIL/uL — ABNORMAL LOW (ref 3.87–5.11)
RBC: 3.11 MIL/uL — ABNORMAL LOW (ref 3.87–5.11)
RBC: 3.22 MIL/uL — ABNORMAL LOW (ref 3.87–5.11)
RBC: 3.46 MIL/uL — ABNORMAL LOW (ref 3.87–5.11)
RBC: 3.82 MIL/uL — ABNORMAL LOW (ref 3.87–5.11)
RDW: 15.1 % (ref 11.5–15.5)
RDW: 15.4 % (ref 11.5–15.5)
RDW: 15.7 % — ABNORMAL HIGH (ref 11.5–15.5)
RDW: 15.9 % — ABNORMAL HIGH (ref 11.5–15.5)
WBC: 10.1 10*3/uL (ref 4.0–10.5)
WBC: 11.3 10*3/uL — ABNORMAL HIGH (ref 4.0–10.5)
WBC: 14.5 10*3/uL — ABNORMAL HIGH (ref 4.0–10.5)
WBC: 7.9 10*3/uL (ref 4.0–10.5)
WBC: 8.9 10*3/uL (ref 4.0–10.5)
WBC: 9 10*3/uL (ref 4.0–10.5)
WBC: 9.8 10*3/uL (ref 4.0–10.5)

## 2010-11-19 LAB — POCT I-STAT, CHEM 8
BUN: 39 mg/dL — ABNORMAL HIGH (ref 6–23)
Calcium, Ion: 1.12 mmol/L (ref 1.12–1.32)
Chloride: 114 mEq/L — ABNORMAL HIGH (ref 96–112)
Creatinine, Ser: 1.6 mg/dL — ABNORMAL HIGH (ref 0.4–1.2)
Sodium: 139 mEq/L (ref 135–145)
TCO2: 20 mmol/L (ref 0–100)

## 2010-11-19 LAB — BASIC METABOLIC PANEL
BUN: 27 mg/dL — ABNORMAL HIGH (ref 6–23)
Calcium: 8 mg/dL — ABNORMAL LOW (ref 8.4–10.5)
Calcium: 8.8 mg/dL (ref 8.4–10.5)
Calcium: 8.8 mg/dL (ref 8.4–10.5)
Creatinine, Ser: 1.67 mg/dL — ABNORMAL HIGH (ref 0.4–1.2)
GFR calc Af Amer: 38 mL/min — ABNORMAL LOW (ref >60)
GFR calc Af Amer: 38 mL/min — ABNORMAL LOW (ref >60)
GFR calc non Af Amer: 31 mL/min — ABNORMAL LOW (ref >60)
GFR calc non Af Amer: 31 mL/min — ABNORMAL LOW (ref >60)
GFR calc non Af Amer: 42 mL/min — ABNORMAL LOW (ref >60)
Glucose, Bld: 84 mg/dL (ref 70–99)
Glucose, Bld: 95 mg/dL (ref 70–99)
Potassium: 4.6 mEq/L (ref 3.5–5.1)
Sodium: 139 mEq/L (ref 135–145)
Sodium: 139 mEq/L (ref 135–145)

## 2010-11-19 LAB — DIFFERENTIAL
Basophils Absolute: 0 10*3/uL (ref 0.0–0.1)
Eosinophils Absolute: 0.2 10*3/uL (ref 0.0–0.7)
Eosinophils Relative: 3 % (ref 0–5)
Eosinophils Relative: 3 % (ref 0–5)
Eosinophils Relative: 3 % (ref 0–5)
Lymphocytes Relative: 14 % (ref 12–46)
Lymphocytes Relative: 15 % (ref 12–46)
Lymphs Abs: 1.1 10*3/uL (ref 0.7–4.0)
Lymphs Abs: 1.1 10*3/uL (ref 0.7–4.0)
Lymphs Abs: 1.2 10*3/uL (ref 0.7–4.0)
Lymphs Abs: 1.3 10*3/uL (ref 0.7–4.0)
Monocytes Absolute: 0.5 10*3/uL (ref 0.1–1.0)
Monocytes Absolute: 0.8 10*3/uL (ref 0.1–1.0)
Monocytes Relative: 5 % (ref 3–12)
Monocytes Relative: 6 % (ref 3–12)
Monocytes Relative: 6 % (ref 3–12)
Neutro Abs: 12.4 10*3/uL — ABNORMAL HIGH (ref 1.7–7.7)
Neutro Abs: 6.7 10*3/uL (ref 1.7–7.7)
Neutrophils Relative %: 86 % — ABNORMAL HIGH (ref 43–77)

## 2010-11-19 LAB — BRAIN NATRIURETIC PEPTIDE: Pro B Natriuretic peptide (BNP): 40.4 pg/mL (ref 0.0–100.0)

## 2010-11-19 LAB — SYNOVIAL CELL COUNT + DIFF, W/ CRYSTALS
Lymphocytes-Synovial Fld: 9 % (ref 0–20)
Neutrophil, Synovial: 81 % — ABNORMAL HIGH (ref 0–25)
WBC, Synovial: 1025 /mm3 — ABNORMAL HIGH (ref 0–200)

## 2010-11-19 LAB — FERRITIN: Ferritin: 512 ng/mL — ABNORMAL HIGH (ref 10–291)

## 2010-11-19 LAB — HEMOGLOBIN A1C: Hgb A1c MFr Bld: 7 % — ABNORMAL HIGH (ref 4.6–6.1)

## 2010-11-19 LAB — D-DIMER, QUANTITATIVE: D-Dimer, Quant: 3.91 ug/mL-FEU — ABNORMAL HIGH (ref 0.00–0.48)

## 2010-11-19 LAB — IRON AND TIBC: UIBC: 207 ug/dL

## 2010-11-19 LAB — CARDIAC PANEL(CRET KIN+CKTOT+MB+TROPI): CK, MB: 0.6 ng/mL (ref 0.3–4.0)

## 2010-11-19 LAB — VITAMIN B12: Vitamin B-12: 454 pg/mL (ref 211–911)

## 2010-11-19 LAB — CULTURE, BLOOD (ROUTINE X 2): Culture: NO GROWTH

## 2010-11-19 LAB — TSH: TSH: 1.01 u[IU]/mL (ref 0.350–4.500)

## 2010-11-19 LAB — URIC ACID: Uric Acid, Serum: 10.8 mg/dL — ABNORMAL HIGH (ref 2.4–7.0)

## 2010-11-19 LAB — C-REACTIVE PROTEIN: CRP: 22.9 mg/dL — ABNORMAL HIGH (ref <0.6)

## 2010-11-19 LAB — BODY FLUID CULTURE

## 2010-11-19 LAB — SEDIMENTATION RATE: Sed Rate: 138 mm/hr — ABNORMAL HIGH (ref 0–22)

## 2010-12-04 LAB — GLUCOSE, CAPILLARY
Glucose-Capillary: 123 mg/dL — ABNORMAL HIGH (ref 70–99)
Glucose-Capillary: 152 mg/dL — ABNORMAL HIGH (ref 70–99)
Glucose-Capillary: 162 mg/dL — ABNORMAL HIGH (ref 70–99)

## 2010-12-05 LAB — CBC
HCT: 28.3 % — ABNORMAL LOW (ref 36.0–46.0)
Hemoglobin: 10.5 g/dL — ABNORMAL LOW (ref 12.0–15.0)
Hemoglobin: 9.5 g/dL — ABNORMAL LOW (ref 12.0–15.0)
MCHC: 33.2 g/dL (ref 30.0–36.0)
MCHC: 33.6 g/dL (ref 30.0–36.0)
MCV: 80.4 fL (ref 78.0–100.0)
Platelets: 279 10*3/uL (ref 150–400)
RBC: 3.52 MIL/uL — ABNORMAL LOW (ref 3.87–5.11)
RDW: 17 % — ABNORMAL HIGH (ref 11.5–15.5)

## 2010-12-05 LAB — URINALYSIS, ROUTINE W REFLEX MICROSCOPIC
Bilirubin Urine: NEGATIVE
Hgb urine dipstick: NEGATIVE
Nitrite: NEGATIVE
Protein, ur: NEGATIVE mg/dL
Urobilinogen, UA: 0.2 mg/dL (ref 0.0–1.0)

## 2010-12-05 LAB — GLUCOSE, CAPILLARY
Glucose-Capillary: 106 mg/dL — ABNORMAL HIGH (ref 70–99)
Glucose-Capillary: 108 mg/dL — ABNORMAL HIGH (ref 70–99)
Glucose-Capillary: 116 mg/dL — ABNORMAL HIGH (ref 70–99)
Glucose-Capillary: 125 mg/dL — ABNORMAL HIGH (ref 70–99)
Glucose-Capillary: 127 mg/dL — ABNORMAL HIGH (ref 70–99)
Glucose-Capillary: 142 mg/dL — ABNORMAL HIGH (ref 70–99)
Glucose-Capillary: 151 mg/dL — ABNORMAL HIGH (ref 70–99)
Glucose-Capillary: 175 mg/dL — ABNORMAL HIGH (ref 70–99)
Glucose-Capillary: 52 mg/dL — ABNORMAL LOW (ref 70–99)
Glucose-Capillary: 56 mg/dL — ABNORMAL LOW (ref 70–99)
Glucose-Capillary: 73 mg/dL (ref 70–99)
Glucose-Capillary: 74 mg/dL (ref 70–99)

## 2010-12-05 LAB — DIFFERENTIAL
Basophils Absolute: 0 10*3/uL (ref 0.0–0.1)
Basophils Relative: 1 % (ref 0–1)
Eosinophils Relative: 4 % (ref 0–5)
Lymphocytes Relative: 21 % (ref 12–46)
Monocytes Absolute: 0.4 10*3/uL (ref 0.1–1.0)
Neutro Abs: 3.9 10*3/uL (ref 1.7–7.7)

## 2010-12-05 LAB — POCT CARDIAC MARKERS
CKMB, poc: <1 ng/mL — ABNORMAL LOW (ref 1.0–8.0)
Myoglobin, poc: 123 ng/mL (ref 12–200)
Troponin i, poc: <0.05 ng/mL (ref 0.00–0.09)

## 2010-12-05 LAB — POCT I-STAT, CHEM 8
BUN: 18 mg/dL (ref 6–23)
Calcium, Ion: 1.1 mmol/L — ABNORMAL LOW (ref 1.12–1.32)
Chloride: 109 meq/L (ref 96–112)
Creatinine, Ser: 1.4 mg/dL — ABNORMAL HIGH (ref 0.4–1.2)
Glucose, Bld: 98 mg/dL (ref 70–99)
HCT: 33 % — ABNORMAL LOW (ref 36.0–46.0)
Hemoglobin: 11.2 g/dL — ABNORMAL LOW (ref 12.0–15.0)
Potassium: 4.3 meq/L (ref 3.5–5.1)
Sodium: 141 meq/L (ref 135–145)
TCO2: 22 mmol/L (ref 0–100)

## 2010-12-05 LAB — BASIC METABOLIC PANEL
CO2: 27 mEq/L (ref 19–32)
Calcium: 8.9 mg/dL (ref 8.4–10.5)
Chloride: 113 mEq/L — ABNORMAL HIGH (ref 96–112)
GFR calc Af Amer: 56 mL/min — ABNORMAL LOW (ref >60)
Potassium: 4.4 mEq/L (ref 3.5–5.1)
Sodium: 145 mEq/L (ref 135–145)

## 2010-12-05 LAB — TSH: TSH: 1.21 u[IU]/mL (ref 0.350–4.500)

## 2010-12-05 LAB — HEMOGLOBIN A1C
Hgb A1c MFr Bld: 5.7 % (ref 4.6–6.1)
Mean Plasma Glucose: 117 mg/dL

## 2010-12-05 LAB — SEDIMENTATION RATE: Sed Rate: 105 mm/hr — ABNORMAL HIGH (ref 0–22)

## 2010-12-05 LAB — HOMOCYSTEINE: Homocysteine: 12.2 umol/L (ref 4.0–15.4)

## 2010-12-05 LAB — BRAIN NATRIURETIC PEPTIDE: Pro B Natriuretic peptide (BNP): <30 pg/mL (ref 0.0–100.0)

## 2010-12-07 LAB — GLUCOSE, CAPILLARY
Glucose-Capillary: 106 mg/dL — ABNORMAL HIGH (ref 70–99)
Glucose-Capillary: 113 mg/dL — ABNORMAL HIGH (ref 70–99)
Glucose-Capillary: 116 mg/dL — ABNORMAL HIGH (ref 70–99)
Glucose-Capillary: 120 mg/dL — ABNORMAL HIGH (ref 70–99)
Glucose-Capillary: 126 mg/dL — ABNORMAL HIGH (ref 70–99)
Glucose-Capillary: 127 mg/dL — ABNORMAL HIGH (ref 70–99)
Glucose-Capillary: 88 mg/dL (ref 70–99)
Glucose-Capillary: 106 mg/dL — ABNORMAL HIGH (ref 70–99)
Glucose-Capillary: 107 mg/dL — ABNORMAL HIGH (ref 70–99)
Glucose-Capillary: 107 mg/dL — ABNORMAL HIGH (ref 70–99)
Glucose-Capillary: 115 mg/dL — ABNORMAL HIGH (ref 70–99)
Glucose-Capillary: 118 mg/dL — ABNORMAL HIGH (ref 70–99)
Glucose-Capillary: 123 mg/dL — ABNORMAL HIGH (ref 70–99)
Glucose-Capillary: 132 mg/dL — ABNORMAL HIGH (ref 70–99)
Glucose-Capillary: 136 mg/dL — ABNORMAL HIGH (ref 70–99)
Glucose-Capillary: 138 mg/dL — ABNORMAL HIGH (ref 70–99)
Glucose-Capillary: 147 mg/dL — ABNORMAL HIGH (ref 70–99)
Glucose-Capillary: 150 mg/dL — ABNORMAL HIGH (ref 70–99)
Glucose-Capillary: 153 mg/dL — ABNORMAL HIGH (ref 70–99)
Glucose-Capillary: 156 mg/dL — ABNORMAL HIGH (ref 70–99)
Glucose-Capillary: 169 mg/dL — ABNORMAL HIGH (ref 70–99)
Glucose-Capillary: 183 mg/dL — ABNORMAL HIGH (ref 70–99)
Glucose-Capillary: 70 mg/dL (ref 70–99)
Glucose-Capillary: 74 mg/dL (ref 70–99)
Glucose-Capillary: 81 mg/dL (ref 70–99)
Glucose-Capillary: 83 mg/dL (ref 70–99)
Glucose-Capillary: 90 mg/dL (ref 70–99)

## 2010-12-07 LAB — DIFFERENTIAL
Basophils Absolute: 0 10*3/uL (ref 0.0–0.1)
Basophils Relative: 0 % (ref 0–1)
Eosinophils Absolute: 0.1 10*3/uL (ref 0.0–0.7)
Eosinophils Relative: 1 % (ref 0–5)
Eosinophils Relative: 5 % (ref 0–5)
Lymphocytes Relative: 14 % (ref 12–46)
Lymphs Abs: 1.3 10*3/uL (ref 0.7–4.0)
Monocytes Absolute: 0.6 10*3/uL (ref 0.1–1.0)
Monocytes Absolute: 0.5 10*3/uL (ref 0.1–1.0)
Monocytes Relative: 7 % (ref 3–12)
Monocytes Relative: 7 % (ref 3–12)
Neutro Abs: 5.8 10*3/uL (ref 1.7–7.7)

## 2010-12-07 LAB — CBC
HCT: 26.8 % — ABNORMAL LOW (ref 36.0–46.0)
HCT: 27.4 % — ABNORMAL LOW (ref 36.0–46.0)
HCT: 27.7 % — ABNORMAL LOW (ref 36.0–46.0)
HCT: 30.4 % — ABNORMAL LOW (ref 36.0–46.0)
HCT: 33.8 % — ABNORMAL LOW (ref 36.0–46.0)
Hemoglobin: 9.1 g/dL — ABNORMAL LOW (ref 12.0–15.0)
Hemoglobin: 9.3 g/dL — ABNORMAL LOW (ref 12.0–15.0)
Hemoglobin: 10 g/dL — ABNORMAL LOW (ref 12.0–15.0)
Hemoglobin: 9.4 g/dL — ABNORMAL LOW (ref 12.0–15.0)
Hemoglobin: 9.6 g/dL — ABNORMAL LOW (ref 12.0–15.0)
MCHC: 33.1 g/dL (ref 30.0–36.0)
MCHC: 33.2 g/dL (ref 30.0–36.0)
MCHC: 34 g/dL (ref 30.0–36.0)
MCHC: 33.1 g/dL (ref 30.0–36.0)
MCHC: 34.6 g/dL (ref 30.0–36.0)
MCHC: 34.7 g/dL (ref 30.0–36.0)
MCV: 81.6 fL (ref 78.0–100.0)
MCV: 82 fL (ref 78.0–100.0)
MCV: 80.1 fL (ref 78.0–100.0)
MCV: 80.7 fL (ref 78.0–100.0)
MCV: 82.1 fL (ref 78.0–100.0)
MCV: 82.2 fL (ref 78.0–100.0)
MCV: 82.4 fL (ref 78.0–100.0)
Platelets: 225 10*3/uL (ref 150–400)
Platelets: 267 10*3/uL (ref 150–400)
Platelets: 272 10*3/uL (ref 150–400)
Platelets: 283 10*3/uL (ref 150–400)
Platelets: 212 10*3/uL (ref 150–400)
Platelets: 217 10*3/uL (ref 150–400)
Platelets: 233 10*3/uL (ref 150–400)
RBC: 3.24 MIL/uL — ABNORMAL LOW (ref 3.87–5.11)
RBC: 3.35 MIL/uL — ABNORMAL LOW (ref 3.87–5.11)
RBC: 3.39 MIL/uL — ABNORMAL LOW (ref 3.87–5.11)
RBC: 3.46 MIL/uL — ABNORMAL LOW (ref 3.87–5.11)
RBC: 3.56 MIL/uL — ABNORMAL LOW (ref 3.87–5.11)
RBC: 3.67 MIL/uL — ABNORMAL LOW (ref 3.87–5.11)
RBC: 4.19 MIL/uL (ref 3.87–5.11)
RDW: 14.5 % (ref 11.5–15.5)
RDW: 14.7 % (ref 11.5–15.5)
RDW: 14.9 % (ref 11.5–15.5)
RDW: 14.9 % (ref 11.5–15.5)
RDW: 15.6 % — ABNORMAL HIGH (ref 11.5–15.5)
RDW: 15.8 % — ABNORMAL HIGH (ref 11.5–15.5)
WBC: 6.4 10*3/uL (ref 4.0–10.5)
WBC: 8.4 10*3/uL (ref 4.0–10.5)
WBC: 6.7 10*3/uL (ref 4.0–10.5)
WBC: 6.8 10*3/uL (ref 4.0–10.5)
WBC: 7.1 10*3/uL (ref 4.0–10.5)
WBC: 7.8 10*3/uL (ref 4.0–10.5)

## 2010-12-07 LAB — BASIC METABOLIC PANEL
BUN: 43 mg/dL — ABNORMAL HIGH (ref 6–23)
BUN: 47 mg/dL — ABNORMAL HIGH (ref 6–23)
BUN: 52 mg/dL — ABNORMAL HIGH (ref 6–23)
BUN: 21 mg/dL (ref 6–23)
BUN: 25 mg/dL — ABNORMAL HIGH (ref 6–23)
BUN: 34 mg/dL — ABNORMAL HIGH (ref 6–23)
BUN: 36 mg/dL — ABNORMAL HIGH (ref 6–23)
CO2: 27 mEq/L (ref 19–32)
CO2: 19 mEq/L (ref 19–32)
CO2: 24 mEq/L (ref 19–32)
CO2: 25 mEq/L (ref 19–32)
Calcium: 8.2 mg/dL — ABNORMAL LOW (ref 8.4–10.5)
Calcium: 8.4 mg/dL (ref 8.4–10.5)
Calcium: 8.1 mg/dL — ABNORMAL LOW (ref 8.4–10.5)
Calcium: 8.5 mg/dL (ref 8.4–10.5)
Calcium: 8.6 mg/dL (ref 8.4–10.5)
Chloride: 102 mEq/L (ref 96–112)
Chloride: 104 mEq/L (ref 96–112)
Chloride: 103 mEq/L (ref 96–112)
Chloride: 105 mEq/L (ref 96–112)
Chloride: 118 mEq/L — ABNORMAL HIGH (ref 96–112)
Creatinine, Ser: 1.5 mg/dL — ABNORMAL HIGH (ref 0.4–1.2)
Creatinine, Ser: 1.64 mg/dL — ABNORMAL HIGH (ref 0.4–1.2)
Creatinine, Ser: 1.87 mg/dL — ABNORMAL HIGH (ref 0.4–1.2)
Creatinine, Ser: 1.17 mg/dL (ref 0.4–1.2)
Creatinine, Ser: 1.25 mg/dL — ABNORMAL HIGH (ref 0.4–1.2)
Creatinine, Ser: 1.44 mg/dL — ABNORMAL HIGH (ref 0.4–1.2)
GFR calc Af Amer: 43 mL/min — ABNORMAL LOW (ref >60)
GFR calc Af Amer: 45 mL/min — ABNORMAL LOW (ref >60)
GFR calc Af Amer: 53 mL/min — ABNORMAL LOW (ref >60)
GFR calc Af Amer: 56 mL/min — ABNORMAL LOW (ref >60)
GFR calc Af Amer: 57 mL/min — ABNORMAL LOW (ref >60)
GFR calc Af Amer: >60 mL/min (ref >60)
GFR calc non Af Amer: 29 mL/min — ABNORMAL LOW (ref >60)
GFR calc non Af Amer: 32 mL/min — ABNORMAL LOW (ref >60)
GFR calc non Af Amer: 35 mL/min — ABNORMAL LOW (ref >60)
GFR calc non Af Amer: 37 mL/min — ABNORMAL LOW (ref >60)
GFR calc non Af Amer: 47 mL/min — ABNORMAL LOW (ref >60)
GFR calc non Af Amer: 47 mL/min — ABNORMAL LOW (ref >60)
GFR calc non Af Amer: 50 mL/min — ABNORMAL LOW (ref >60)
Glucose, Bld: 70 mg/dL (ref 70–99)
Glucose, Bld: 89 mg/dL (ref 70–99)
Glucose, Bld: 91 mg/dL (ref 70–99)
Glucose, Bld: 102 mg/dL — ABNORMAL HIGH (ref 70–99)
Glucose, Bld: 76 mg/dL (ref 70–99)
Potassium: 4.8 mEq/L (ref 3.5–5.1)
Potassium: 5.2 mEq/L — ABNORMAL HIGH (ref 3.5–5.1)
Potassium: 4 mEq/L (ref 3.5–5.1)
Potassium: 4 mEq/L (ref 3.5–5.1)
Potassium: 4.1 mEq/L (ref 3.5–5.1)
Sodium: 136 mEq/L (ref 135–145)
Sodium: 139 mEq/L (ref 135–145)
Sodium: 139 mEq/L (ref 135–145)
Sodium: 140 mEq/L (ref 135–145)
Sodium: 143 mEq/L (ref 135–145)

## 2010-12-07 LAB — RETICULOCYTES: RBC.: 3.8 MIL/uL — ABNORMAL LOW (ref 3.87–5.11)

## 2010-12-07 LAB — URINALYSIS, ROUTINE W REFLEX MICROSCOPIC
Bilirubin Urine: NEGATIVE
Glucose, UA: NEGATIVE mg/dL
Hgb urine dipstick: NEGATIVE
Ketones, ur: NEGATIVE mg/dL
Nitrite: NEGATIVE
Nitrite: NEGATIVE
Protein, ur: NEGATIVE mg/dL
Specific Gravity, Urine: 1.018 (ref 1.005–1.030)
Specific Gravity, Urine: 1.015 (ref 1.005–1.030)
Urobilinogen, UA: 1 mg/dL (ref 0.0–1.0)
pH: 5 (ref 5.0–8.0)

## 2010-12-07 LAB — URINE CULTURE
Colony Count: NO GROWTH
Culture: NO GROWTH

## 2010-12-07 LAB — COMPREHENSIVE METABOLIC PANEL
AST: 12 U/L (ref 0–37)
Albumin: 2.5 g/dL — ABNORMAL LOW (ref 3.5–5.2)
BUN: 39 mg/dL — ABNORMAL HIGH (ref 6–23)
Chloride: 114 mEq/L — ABNORMAL HIGH (ref 96–112)
Creatinine, Ser: 1.53 mg/dL — ABNORMAL HIGH (ref 0.4–1.2)
GFR calc Af Amer: 42 mL/min — ABNORMAL LOW (ref >60)
Potassium: 5.1 mEq/L (ref 3.5–5.1)
Total Bilirubin: 0.5 mg/dL (ref 0.3–1.2)
Total Protein: 7 g/dL (ref 6.0–8.3)

## 2010-12-07 LAB — PROTIME-INR: Prothrombin Time: 15.8 seconds — ABNORMAL HIGH (ref 11.6–15.2)

## 2010-12-07 LAB — IRON AND TIBC
Saturation Ratios: 9 % — ABNORMAL LOW (ref 20–55)
TIBC: 207 ug/dL — ABNORMAL LOW (ref 250–470)
UIBC: 189 ug/dL

## 2010-12-07 LAB — URINE MICROSCOPIC-ADD ON

## 2010-12-07 LAB — FERRITIN: Ferritin: 345 ng/mL — ABNORMAL HIGH (ref 10–291)

## 2010-12-07 LAB — CULTURE, BLOOD (ROUTINE X 2)

## 2010-12-07 LAB — D-DIMER, QUANTITATIVE: D-Dimer, Quant: 1.65 ug/mL-FEU — ABNORMAL HIGH (ref 0.00–0.48)

## 2010-12-07 LAB — LIPID PANEL
Cholesterol: 114 mg/dL (ref 0–200)
LDL Cholesterol: 65 mg/dL (ref 0–99)
Total CHOL/HDL Ratio: 3.1 RATIO
Triglycerides: 59 mg/dL (ref <150)
VLDL: 12 mg/dL (ref 0–40)

## 2010-12-07 LAB — ABO/RH: ABO/RH(D): O POS

## 2010-12-07 LAB — URIC ACID: Uric Acid, Serum: 11.4 mg/dL — ABNORMAL HIGH (ref 2.4–7.0)

## 2010-12-07 LAB — CROSSMATCH: Antibody Screen: NEGATIVE

## 2010-12-07 LAB — HEMOGLOBIN A1C: Mean Plasma Glucose: 126 mg/dL

## 2010-12-07 LAB — FOLATE: Folate: >20 ng/mL

## 2010-12-20 ENCOUNTER — Encounter (HOSPITAL_COMMUNITY): Payer: Self-pay | Admitting: Radiology

## 2010-12-20 ENCOUNTER — Emergency Department (HOSPITAL_COMMUNITY): Payer: Medicare Other

## 2010-12-20 ENCOUNTER — Observation Stay (HOSPITAL_COMMUNITY)
Admission: EM | Admit: 2010-12-20 | Discharge: 2010-12-22 | Disposition: A | Discharge status: Home / Self Care | Payer: Medicare Other | Attending: Internal Medicine | Admitting: Internal Medicine

## 2010-12-20 DIAGNOSIS — L97909 Non-pressure chronic ulcer of unspecified part of unspecified lower leg with unspecified severity: Secondary | ICD-10-CM | POA: Insufficient documentation

## 2010-12-20 DIAGNOSIS — I517 Cardiomegaly: Secondary | ICD-10-CM | POA: Insufficient documentation

## 2010-12-20 DIAGNOSIS — G4733 Obstructive sleep apnea (adult) (pediatric): Secondary | ICD-10-CM | POA: Insufficient documentation

## 2010-12-20 DIAGNOSIS — E785 Hyperlipidemia, unspecified: Secondary | ICD-10-CM | POA: Insufficient documentation

## 2010-12-20 DIAGNOSIS — R209 Unspecified disturbances of skin sensation: Principal | ICD-10-CM | POA: Insufficient documentation

## 2010-12-20 DIAGNOSIS — M25519 Pain in unspecified shoulder: Secondary | ICD-10-CM | POA: Insufficient documentation

## 2010-12-20 DIAGNOSIS — I129 Hypertensive chronic kidney disease with stage 1 through stage 4 chronic kidney disease, or unspecified chronic kidney disease: Secondary | ICD-10-CM | POA: Insufficient documentation

## 2010-12-20 DIAGNOSIS — R443 Hallucinations, unspecified: Secondary | ICD-10-CM | POA: Insufficient documentation

## 2010-12-20 DIAGNOSIS — I519 Heart disease, unspecified: Secondary | ICD-10-CM | POA: Insufficient documentation

## 2010-12-20 DIAGNOSIS — I872 Venous insufficiency (chronic) (peripheral): Secondary | ICD-10-CM | POA: Insufficient documentation

## 2010-12-20 DIAGNOSIS — N183 Chronic kidney disease, stage 3 unspecified: Secondary | ICD-10-CM | POA: Insufficient documentation

## 2010-12-20 HISTORY — DX: Essential (primary) hypertension: I10

## 2010-12-20 LAB — URINALYSIS, ROUTINE W REFLEX MICROSCOPIC
Glucose, UA: NEGATIVE mg/dL
Leukocytes, UA: NEGATIVE
Specific Gravity, Urine: 1.012 (ref 1.005–1.030)
pH: 6 (ref 5.0–8.0)

## 2010-12-20 LAB — DIFFERENTIAL
Basophils Relative: 0 % (ref 0–1)
Eosinophils Absolute: 0.2 10*3/uL (ref 0.0–0.7)
Eosinophils Relative: 4 % (ref 0–5)
Lymphs Abs: 1.4 10*3/uL (ref 0.7–4.0)
Monocytes Relative: 9 % (ref 3–12)
Neutrophils Relative %: 61 % (ref 43–77)

## 2010-12-20 LAB — CBC
MCH: 26.1 pg (ref 26.0–34.0)
MCV: 81.9 fL (ref 78.0–100.0)
Platelets: 260 10*3/uL (ref 150–400)
RBC: 4.48 MIL/uL (ref 3.87–5.11)
RDW: 16.2 % — ABNORMAL HIGH (ref 11.5–15.5)
WBC: 5.5 10*3/uL (ref 4.0–10.5)

## 2010-12-20 LAB — COMPREHENSIVE METABOLIC PANEL
CO2: 23 mEq/L (ref 19–32)
Calcium: 8.6 mg/dL (ref 8.4–10.5)
Creatinine, Ser: 1.47 mg/dL — ABNORMAL HIGH (ref 0.4–1.2)
GFR calc non Af Amer: 36 mL/min — ABNORMAL LOW (ref >60)
Glucose, Bld: 106 mg/dL — ABNORMAL HIGH (ref 70–99)
Total Protein: 7.7 g/dL (ref 6.0–8.3)

## 2010-12-20 LAB — URINE MICROSCOPIC-ADD ON

## 2010-12-20 LAB — PROTIME-INR: Prothrombin Time: 13.9 seconds (ref 11.6–15.2)

## 2010-12-21 ENCOUNTER — Other Ambulatory Visit (HOSPITAL_COMMUNITY): Payer: Medicare Other

## 2010-12-21 DIAGNOSIS — R209 Unspecified disturbances of skin sensation: Secondary | ICD-10-CM

## 2010-12-21 LAB — CARDIAC PANEL(CRET KIN+CKTOT+MB+TROPI)
CK, MB: 1.5 ng/mL (ref 0.3–4.0)
Total CK: 83 U/L (ref 7–177)
Total CK: 82 U/L (ref 7–177)
Troponin I: 0.1 ng/mL — ABNORMAL HIGH (ref 0.00–0.06)

## 2010-12-21 LAB — GLUCOSE, CAPILLARY
Glucose-Capillary: 115 mg/dL — ABNORMAL HIGH (ref 70–99)
Glucose-Capillary: 134 mg/dL — ABNORMAL HIGH (ref 70–99)
Glucose-Capillary: 116 mg/dL — ABNORMAL HIGH (ref 70–99)

## 2010-12-21 LAB — LIPID PANEL
Cholesterol: 200 mg/dL (ref 0–200)
LDL Cholesterol: 143 mg/dL — ABNORMAL HIGH (ref 0–99)
Triglycerides: 115 mg/dL (ref <150)
VLDL: 23 mg/dL (ref 0–40)

## 2010-12-21 LAB — FOLATE: Folate: 10 ng/mL

## 2010-12-21 LAB — VITAMIN B12: Vitamin B-12: 1177 pg/mL — ABNORMAL HIGH (ref 211–911)

## 2010-12-21 LAB — IRON AND TIBC: UIBC: 240 ug/dL

## 2010-12-21 NOTE — H&P (Signed)
Kelly [name], Kelly [name]               ACCOUNT NO.:  [id number]  MEDICAL RECORD NO.:  [id number]           PATIENT TYPE:  E  LOCATION:  [facility]                         FACILITY:  [facility]  PHYSICIAN:  Mariea Stable, MD   DATE OF BIRTH:  06/18/1946  DATE OF ADMISSION:  12/20/2010 DATE OF DISCHARGE:                             HISTORY & PHYSICAL   PRIMARY CARE PHYSICIAN:  Dr. Redmond School  CHIEF COMPLAINT:  Left face and left arm heaviness/numbness.  HISTORY OF PRESENT ILLNESS:  [name] is a 65 year old woman with past medical history significant for morbid obesity, chronic kidney disease stage III, hypertension, hyperlipidemia, obstructive sleep apnea, and diastolic dysfunction, who presents with chief complaints of heaviness and numbness of the left face and left arm.  She states that approximately 5 days ago, she noted to have the symptoms mentioned that became more prominent.  Apparently, about a week before that she has been having some left arm heaviness and mentioned it to her primary care physician, but apparently this was thought to be somewhat insignificant. As mentioned 5 days prior to admission, she states that she developed this numbness in the left base involving the left shoulder and left arm. Apparently, her niece arrived home and they called EMS.  However, upon EMS arrival, the patient was feeling better, although she states that her symptoms were not completely resolved and they decided not to come in for further evaluation.  However, she states that these symptoms were slightly persistent throughout even though mild in nature and then today again, she felt them coming on again and more prominent and decided to come in for further evaluation.  She had called her PCP's office prior to doing so, but they did not have any open slots  and she decided to come to the Emergency Department.  REVIEW OF SYSTEMS:  She also reports prior right shoulder surgery as well as left  shoulder pain.  Also has some heaviness in the left chest that has been associated with this.  The patient is somewhat sedentary and has difficulty associating this with any exertion, although these symptoms have been present at rest.  She reports being able to do some of her household chores and occasionally gets some shortness of breath, but denies any chest pain or palpitations.  Furthermore, she denies any neck pain.  She does have with what appears to be some symptoms of neuropathy with burning in the lower extremities.  PAST MEDICAL HISTORY: 1. History of cellulitis with chronic lower extremity wounds and     decubiti in the past. 2. Morbid obesity. 3. Chronic kidney disease stage III. 4. Anemia of chronic disease. 5. Diabetes mellitus type 2. 6. Hypertension. 7. Hyperlipidemia. 8. Obstructive sleep apnea. 9. Chronic diastolic dysfunction. 10.Chronic anxiety. 11.History of gout. 12.Chronic venous insufficiency. 13.Anaphylactic shock to penicillin as a child.  MEDICATIONS:  I will come back to this at the end of the dictation.  ALLERGIES:  PENICILLIN.  SOCIAL HISTORY:  The patient lives alone.  She is disabled, but continues to have advanced home care visits.  PRIMARY MEDICAL DOCTOR:  She has a remote history of  smoking for approximately 20 years, but quit many, many years ago.  She denies any alcohol or illicit drug use.  FAMILY HISTORY:  Mother had an MI at age 6, father had emphysema and there is another sister with heart disease and a brother who apparently had stomach cancer.  REVIEW OF SYSTEMS:  As per HPI.  All others reviewed and negative.  PHYSICAL EXAMINATION:  VITAL SIGNS:  Temperature 97.2, blood pressure 161/72, heart rate 76, respirations 18, and oxygen saturation 98%. GENERAL:  This is a morbidly obese older woman lying in bed in no acute distress who is very pleasant. HEENT:  Head is normocephalic, atraumatic.  Pupils are equally round and reactive  to light.  Extraocular movements are intact.  Mucous membranes are moist and there is no oropharyngeal lesions. NECK:  Massively thick, although there is no palpable adenopathy or thyromegaly.  It is not possible to assess for JVD, there are no carotid bruits. HEART:  There is a normal S1, S2.  There is a regular rate and rhythm. There is a grade 1/6 systolic murmur present, there are no gallops, no rubs. LUNGS:  Clear anterolaterally with good air movement. ABDOMEN:  Morbidly obese, there are normoactive bowel sounds and is soft and nontender. EXTREMITIES:  Distal lower extremities again are obese, but there is no pitting edema present. NEUROLOGIC:  The patient is awake, alert, and oriented x3.  Cranial II through XII are grossly intact.  Strength is 5/5 in all four extremities.  Sensation to light touch is grossly intact.  There is no pronator drift.  Fine motor movements are intact.  LABORATORY DATA:  WBC 5.5, hemoglobin 11.7, and platelets 260.  PT 13.9 and INR 1.05.  Sodium 141, potassium 5.0, chloride 110, bicarb 23, glucose 106, BUN 23, creatinine 1.5, bilirubin 0.1, alkaline phosphatase 92, AST 20, ALT 16, total protein 7, albumin 3.0, and calcium 8.6.  IMAGING DATA:  Chest x-ray impression, 1. Cardiomegaly with pulmonary venous hypertension. 2. Mild airway thickening, possibly a manifestation of bronchitis or     reactive airway disease.  CT head without contrast impression, 1. Minimal chronic ischemic vascular white matter disease. 2. Otherwise, no significant abnormality identified.  ASSESSMENT/PLAN: 1. Left face, arm, chest heaviness and numbness.  This is somewhat     atypical, but given her age and diabetes concerning differential     includes transient ischemic attack/cerebrovascular accident as well     as anginal equivalence.  At this point, we will admit to tele,     check an EKG, cycle cardiac enzymes and risk stratify with a     fasting lipid panel and  hemoglobin A1c.  We will also check a TSH     for other reasons to be mentioned.  Consider getting an MRI to     further assess for possible stroke along with an MRA, but the     patient is too heavy for the MRI machine.  Can consider rechecking     CT scan, however, given symptoms that she had approximately 5 days     ago that did not fully resolve and are ongoing, we would suspect     that if this were significant cerebrovascular accident, it would     have been identified by now on the CT scan.  We will go ahead and     check carotid Dopplers.  We will not check a 2-D echocardiogram at     this point, as she has had one as  of September 2011.  We will go     ahead and start full-dose aspirin and continue her home medications     that are appropriate.  Other possibility is musculoskeletal     etiology and/or cervical radiculopathy.  However, we would prefer     to rule these other more concerning etiologies and workup cervical     radiculopathy and/or musculoskeletal etiologies.  Subsequently, if     these are found to not be the case. 2. Mild anemia.  We will go ahead and check anemia panel to assess, in     particular B12 levels even though MCV is 82, given her neuropathy     (although this is likely secondary to diabetes). 3. Chronic kidney disease stage III.  Her creatinine appears to be     relatively stable at this point.  We will just monitor. 4. Hypertension.  We will continue with her home medications. 5. Obstructive sleep apnea.  The patient would clearly benefit from a     formal sleep study if one has not been done along with CPAP.  This     can be discussed with the patient tomorrow. 6. Diastolic heart failure.  The patient currently is stable from this     perspective. 7. Morbid obesity.  The patient understands that this is one of her     largest underlying health issues.  I assume that, despite which she     states, markedly diminished capacity to do her activities of  daily     living.  This will need to be further assessed and can have     physical and/or occupational therapy assess her to see how     functional she actually is. 8. Diabetes mellitus.  We will risk stratify as mentioned above with a     hemoglobin A1c.  We will continue her home dose of Lantus as well     as sliding scale insulin.     Mariea Stable, MD     MA/MEDQ  D:  12/20/2010  T:  12/20/2010  Job:  [id number]  Electronically Signed by Mariea Stable MD on 12/21/2010 07:18:17 PM

## 2010-12-28 NOTE — Discharge Summary (Signed)
Kelly [name], Kelly [name]               ACCOUNT NO.:  [id number]  MEDICAL RECORD NO.:  [id number]           PATIENT TYPE:  O  LOCATION:  3034                         FACILITY:  [facility]  PHYSICIAN:  Jeoffrey Massed, MD    DATE OF BIRTH:  [birth year]  DATE OF ADMISSION:  12/20/2010 DATE OF DISCHARGE:                        DISCHARGE SUMMARY - REFERRING   PRIMARY CARE PRACTITIONER:  Dr. Florentina Jenny.  PRIMARY DISCHARGE DIAGNOSES: 1. Questionable transient ischemic attack. 2. Dyslipidemia.  SECONDARY DISCHARGE DIAGNOSES: 1. Morbid obesity. 2. Stage III chronic kidney disease. 3. Anemia of chronic disease. 4. Type 2 diabetes. 5. Hypertension. 6. Dyslipidemia. 7. Obstructive sleep apnea. 8. Chronic diastolic dysfunction. 9. Chronic anxiety. 10.History of gout. 11.History of chronic venous insufficiency and chronic lower extremity     wounds and followed by wound care as an outpatient. 12.History of anaphylactic shock to penicillin as a child.  DISCHARGE MEDICATIONS.: 1. Aspirin 325 mg 1 tablet p.o. daily. 2. Zocor 20 mg 1 tablet p.o. daily. 3. Allopurinol 100 mg 2 tablets p.o. daily. 4. Baclofen 10 mg 1 tablet p.o. daily p.r.n. 5. Colchicine 0.6 mg 1 tablet p.o. twice daily. 6. Dexilant 60 mg 1 capsule p.o. daily. 7. Lasix 80 mg half a tablet daily. 8. Neurontin 100 mg 1 capsule p.o. daily at bedtime. 9. Hydroxyzine 10 mg 1 tablet p.o. 4 times a day. 10.Isosorbide mononitrate 10 mg 1 tablet twice daily. 11.Lantus 55 units subcutaneously at bedtime. 12.Losartan 25 mg 1 tablet daily. 13.Metoprolol 25 mg half a tablet twice daily. 14.Oxycodone 5 mg 1 tablet p.o. q.6 h. p.r.n. pain. 15.OxyContin 10 mg 1 tablet twice daily. 16.KCl 20 mEq 1 tablet p.o. daily. 17.Promethazine 25 mg 1 tablet p.o. twice daily p.r.n. 18.Zolpidem 5 mg 1 tablet p.o. every night p.r.n.  CONSULTATIONS:  None.  BRIEF HISTORY OF PRESENT ILLNESS:  The patient is a very pleasant 65- year-old female with a  history of morbid obesity, who is just about able to walk with a help of walker, lives alone, but her daughter checks on her daily.  She is followed by Dr. Redmond School and his physician assistant by home visits and was brought into the hospital on the 25th complaining of tingling and numbness and heaviness of her left face and left side of her body.  For further details, please see the history and physical that was dictated by Dr. Onalee Hua on admission.  PERTINENT RADIOLOGICAL STUDIES.: 1. CT of the head showed no significant abnormality. 2. Carotid duplex ultrasound showed no significant internal carotid     artery stenosis.  Please note that this is a preliminary result.  PERTINENT LABORATORY DATA:  LDL cholesterol was 143.  Cardiac enzymes 2 were negative.  One was slightly elevated at 0.10.  A review of E-chart showed that the patient had a prior echocardiogram done on May 15, 2010, which showed an EF around 60-65%.  There was grade 1 diastolic dysfunction and this study was not repeated.  BRIEF HOSPITAL COURSE: 1. Questionable TIA.  The patient came in with symptoms of left-sided     body tingling, numbness including her face.  Perhaps, this was a  TIA.  The patient does have a history of having cervical     radiculopathy as well.  In any event, the patient underwent a CT of     the head and this was negative.  Her tingling and numbness resolved     upon hospitalization.  An MRI was ordered, however, because of the     patient's weight we could not do one.  A carotid Doppler ultrasound     was negative for any significant internal carotid artery stenosis.     The patient at this time will be empirically started on aspirin and     also will be given simvastatin.  She will need aggressive risk     factor modification.  This patient lives alone, has morbid obesity,     and is barely ambulatory, so I believe at this point the best     strategy is to pursue aggressive risk factor  modification.  She is     to follow up with her primary care practitioner, Dr. Redmond School, who     apparently does home visits and is scheduled an appointment within     1 week.  She claims understanding. 2. Dyslipidemia.  At this point given a history of morbid obesity,     diabetes, hypertension, she has been started on Zocor. 3. Diabetes.  This is stable.  She is to continue Lantus and her     sliding-scale regimen at home. 4. Hypertension.  This is stable.  She is to continue her usual     medications. 5. The patient does have a chronic left upper thigh wound that has     been followed by a nurse from home health services as an     outpatient.  She will need these services resumed on discharge as     well.  She was seen by our wound care team while in house and the     recommendations are in the chart for outpatient wound care.  DISPOSITION:  It is thought that this patient is stable to be discharged home.  FOLLOWUP INSTRUCTIONS:  The patient is to call Dr. Pilar Grammes office and make a followup appointment within 1 week.  She claims understanding.  Total time spent coordinating discharge 45 minutes.     Jeoffrey Massed, MD  SG/MEDQ  D:  12/22/2010  T:  12/22/2010  Job:  [id number]  cc:   Florentina Jenny  Electronically Signed by Jeoffrey Massed  on 12/28/2010 03:27:50 PM

## 2011-01-09 NOTE — Discharge Summary (Signed)
Kelly [name], Kelly [name]               ACCOUNT NO.:  [id number]   MEDICAL RECORD NO.:  [id number]          PATIENT TYPE:  INP   LOCATION:  5523                         FACILITY:  [facility]   PHYSICIAN:  Lonia Blood, M.D.       DATE OF BIRTH:  [birth year]   DATE OF ADMISSION:  05/20/2008  DATE OF DISCHARGE:  05/28/2008                               DISCHARGE SUMMARY   ADDENDUM   PRIMARY CARE PHYSICIAN:  Drue Dun. Tripp, MD   DISCHARGE DIAGNOSES:  Please refer to the previously dictated discharge  summary done by Dr. Waymon Amato.   1. Weakness and deconditioning.  2. Hypoglycemia.   DISCHARGE MEDICATIONS:  Please refer to the previously dictated  discharge summary done by Dr. Waymon Amato.  Following corrections do apply  1. Hydroxyzine, which was written 10 mg by mouth four times a day has      to be p.r.n. for itching.  2. Lantus is not 75 units, but is 50 units subcutaneously, each      morning at 10 o'clock.  3. NovoLog has to be according to the sliding scale and not a fixed      dose with each meal.  4. Ultracet has been discontinued and the patient will not need it any      more.   For complete list of procedures, hospital course, lab data, and  consultations, please refer to a previously dictated discharge summary.   On the day of May 26, 2008, I have stepped in the room to meet Ms.  [name].  She was flat in the bed and extremely weak.  According to her,  for the past week, she had a prolonged hospitalization.  She got  progressively more deconditioned and weak.  She lives alone at home and  has to provide all the care for herself.  Because of these situation, I  have asked the physical therapist and occupational therapist to  reevaluate the patient and they did agree with my assessment that she  will need short-term rehabilitation.  For the remainder of the  hospitalization, we have continued to decrease the patient's Lantus to  now 50 units every day.  I do expect that the  patient is going to be  eating less that she might require decreased doses of Lantus.  Ms.  [name] is to transfer today to a skilled nursing facility for  rehabilitation.      Lonia Blood, M.D.  Electronically Signed     SL/MEDQ  D:  05/28/2008  T:  05/28/2008  Job:  [id number]

## 2011-01-09 NOTE — Discharge Summary (Signed)
NAMESKYANN, Kelly [name]               ACCOUNT NO.:  [id number]   MEDICAL RECORD NO.:  [id number]          PATIENT TYPE:  INP   LOCATION:  5523                         FACILITY:  [facility]   PHYSICIAN:  Marcellus Scott, MD     DATE OF BIRTH:  09-Jun-1946   DATE OF ADMISSION:  11/01/2008  DATE OF DISCHARGE:                               DISCHARGE SUMMARY   PRIMARY MEDICAL DOCTOR:  Unassigned.   DISCHARGE DIAGNOSES:  1. Febrile illness.  2. Acute on chronic lower extremity edema/anasarca.  3. Acute renal failure secondary to diuretics and angiotensin receptor      blocker.  4. Acute-on-chronic bilateral lower extremity wounds.  5. Anemia.  6. Type 2 diabetes mellitus.  7. Obesity hypoventilation syndrome/obstructive sleep apnea syndrome      on nightly continuous positive airway pressure.  8. Morbid obesity.  9. Atypical chest pain.  Workup negative.  Resolved.  10.Hypertension.  11.Hyperlipidemia.  12.Osteoarthritis.  13.Severe pulmonary hypertension.   DISCHARGE MEDICATIONS:  To be dictated by discharging MD.   PROCEDURES:  1. X-ray of the right knee on March 15:  Severe degenerative changes.      No acute findings.  2. Ventilation perfusion scan.  Impression:  Low likelihood for      pulmonary embolism.  3. Chest x-ray on March 8.  Impression:  A.  Moderate congestive heart      failure. B. Decreased sensitivity and specificity exam due to      technique related factors, as described above. C.  Slightly      asymmetric opacity in the upper lobes greater on the left than      right.  Recommend radiographic follow-up with attention to this      area after diuresis. D.  Bilateral hilar prominence.  This is      similar over prior exams. This may also relate to underlying      congestive failure.  4. Bilateral lower extremity venous Dopplers.  Summary:  No obvious      evidence of DVT, superficial thrombosis, or a Baker's cyst noted      bilaterally. An enlarged inguinal lymph node  is    visualized in the right lower extremity.   PERTINENT LABS:  Basic metabolic panel today remarkable for potassium of  5.2, BUN 43, creatinine 0.76, calcium 8.3.  CBC:  Hemoglobin 10. Urine  culture previously on March 8, no growth.  Hemoglobin A1c 6.  Lipid  panel with HDL 37, otherwise unremarkable. D-dimer 1.65.   CONSULTATIONS:  None.   HOSPITAL COURSE AND DISPOSITION:  Kelly [name] is a very pleasant 65-year-  old morbidly obese female patient with complex medical history including  chronic bilateral lower extremity wounds secondary to chronic bilateral  lower extremity lymphedema, apparent chronic kidney disease with  baseline creatinine 1.5, type 2 diabetes, hypertension, obesity with  hypoventilation syndrome with a broken CPAP machine for the last 2  years, severe pulmonary hypertension with preserved LV function on echo  2009, osteoarthritis, hyperlipidemia who presented with the a 3- to 4-  day history of right-sided pleuritic chest pain.  At  times this was  associated with dyspnea.  There was no relation to exertion.  Also the  patient had a right posterior thigh wound.  She states that she has had  chronic wounds off and on, but during her most recent hospitalization at  [facility] that her wounds were healed.  She states that during a rehab  stay at a local nursing  facility, she suffered abrasions of the posterior aspect of the right  thigh and that these have worsened while she has been at home. She was  then admitted for further evaluation and management.   Problem #1:  Chest pain.  The patient was admitted to telemetry.  Cardiac enzymes were negative.  However, given her body habitus and  sedentary lifestyle, venous thromboembolism was high on the differential  list.  Due to her size, it was difficult to get a CT angiogram of the  chest.  She was placed on full-dose Lovenox.  Subsequently both  bilateral lower extremity venous Dopplers and V/Q scan were  negative.  Lovenox was reduced to DVT prophylaxis dose.  Chest pain has promptly  resolved for a few days now.   Problem #2.  Febrile illness.  Over the last 24 hours, the patient  spiked a temperature of 101 degrees Fahrenheit.  The lower extremity  wounds continue to improve and do not seem to be the source of the  infection.  However, her urine in the Foley catheter appears cloudy and  may be a source.  We will obtain blood cultures, urinalysis and urine  culture and a chest x-ray.  We will decide on antibiotics after  reviewing the urinalysis and chest x-ray results.   Problem #3.  Acute-on-chronic lower extremity edema/anasarca.  This may  be multifactorial secondary to her severe pulmonary hypertension and,  hence, cor pulmonale/ right heart failure, lymphedema, hypoalbuminemia.  The patient was switched to IV Lasix high dose.  With this, patient  diuresed quite well.  Her weight decreased from an admission weight of  186.5 kg to 177.44 kg on March 15.  Her renal functions were closely  followed and actually had normal creatinine until March 13.  Since  yesterday, she had a bump in her creatinine to 1.44.  Her Lasix dose was  reduced and Benicar was held.  Despite this, her potassium was mildly  elevated at 5.2 and her creatinine is even elevated at 1.76.  This was  probably secondary to diuresis and her ARB.  Both will be temporarily  held.  Her basic metabolic panel will be closely monitored.  If her  creatinine stabilizes or start decreasing, we will resume Lasix at a  lower dose.   Problem #4.  Bilateral lower extremity acute-on-chronic wounds.  The  worsening of the edema contributed to worsening of these nonhealing  ulcers.  With improvement in the edema, the wounds are not oozing as  much.  Wound care team has been seeing her.  She was last seen by the  wound care team on March 15.  Both her legs and thighs are dry and  peeling with minimal drainage.  She remains moist  between the skin  folds.  Right posterior thigh/buttock has 2 healing full-thickness  wounds which are 1 x 0.5 x 1.5 cm at 2 locations.  These are 100% red  wound bed.  There is more purulent discharge.  They recommend home  health followup for dressing change assistance.  They also recommended  Mepilex Border to protect from  friction, shear and promote healing.  Other areas may remain open to air.  Antifungal powder to skin folds to  treat the yeast.  The patient complained of some right knee pain  yesterday.  There was no history of trauma.  X-rays just showed  degenerative changes.  Although the uric acid is elevated, this is not  clinically in keeping with gout.  Her right knee is not warm, swollen or  tender.  Today she is indicating the pain is much better.   Problem #5.  Anemia which has remained stable.   Problem #6.  Diabetes mellitus which has been in good inpatient and  outpatient control on current regimen.   Problem #7.  Obesity hypoventilation syndrome/obstructive sleep apnea  syndrome.  The patient indicates that she broke her CPAP machine 2 years  ago and has not been using it.  I have placed her on a nightly CPAP per  respiratory therapy and she will need to continue this on discharge.   Her disposition at this time will depend on her febrile illness and its  workup and management and her renal functions.      Marcellus Scott, MD  Electronically Signed     AH/MEDQ  D:  11/09/2008  T:  11/09/2008  Job:  [id number]

## 2011-01-09 NOTE — Discharge Summary (Signed)
NAMECHANDNI, Kelly [name]               ACCOUNT NO.:  [id number]   MEDICAL RECORD NO.:  [id number]          PATIENT TYPE:  INP   LOCATION:  5532                         FACILITY:  [facility]   PHYSICIAN:  Pedro Earls, MD     DATE OF BIRTH:  [birth year]   DATE OF ADMISSION:  01/22/2009  DATE OF DISCHARGE:                               DISCHARGE SUMMARY   DISCHARGE DIAGNOSES:  1. Headache without any visual symptoms or jaw claudication or any      systemic symptoms.  2. Right-sided chronic of pleuritic chest pain.  3. Chronic lower extremity edema with ulcers and chronic cellulitis.  4. Diabetes type 2.  5. Morbid obesity.  6. Sleep apnea.  7. Hypertension.  8. Hyperlipidemia.  9. Osteoarthritis.  10.Severe pulmonary hypertension.   HOSPITAL COURSE:  A 65 year old African American female patient with  morbid obesity was admitted with right-sided chest pain.  The patient  apparently had stated earlier that she was having pain in the left side  of her head since yesterday morning, but after I have been investigated  and spoke with the patient she mentioned her pain was in the occipital  area, back of her hand.  The pain has been there for a very long time,  in fact it started in 2006 when the patient had a biopsy performed of  temporal artery which was unilateral on the right side and was negative.  The patient's ESR was found to be 105 for which the Doppler of the  temporal artery was ordered.   The patient was seen and examined on the day of discharge.   PHYSICAL EXAMINATION:  VITAL SIGNS:  The temperature was 97.5, pulse 67  respirations 21, blood pressure 123/60, pulse ox 94% on room air.  CVS:  S1 and S2, regular.  CHEST:  Clear.  ABDOMEN:  Soft, nontender.  Bowel sounds present.  No  hepatosplenomegaly.  EXTREMITIES:  Peripheral pulses present.  No clubbing, cyanosis.  The  patient has chronic venous stasis ulcerations and phlebitis in bilateral  lower extremities.   LABORATORY DATA FROM TODAY:  Magnesium was 1.8.  Basic metabolic panel  revealed creatinine of 1.19, HDL was 32.  White count was 4.8, H and H  of 9.5 and 28.3, platelet count is 243.   After reviewing the patient's medical record and discussing with the  patient and investigating it, it does not appear that the patient has  classic findings suggestive of temporal arthritis due to the absence of  visual symptoms, systemic symptoms, jaw claudication, and focal  headache.  The patient did have some nausea.  Questionable the pain is  secondary to some muscular spasms versus questionable atypical migraine  since the patient did have some nausea.   We will get the Doppler scan today.  If it is negative, the patient will  be discharged home.  If it is positive, then the patient will be started  on prednisone and possible biopsy would be performed eventually.   DISCHARGE MEDICATION:  1. NovoLog by sliding scale coverage.  2. Lantus 55 units daily.  3. Diovan 60  daily.  4. Folic acid 1 mg daily.  5. Spironolactone 25 mg daily.  6. Nexium 40 mg daily.  7. Ambien 5 mg at bedtime.  8. Phenergan 25 mg p.r.n.  9. Hydroxyzine 10 mg p.r.n.  10.Vicodin 5 mg p.r.n.  11.Vitamin D 50,000 units weekly.  12.Lasix 80 mg daily.  13.Iron sulfate 325 mg daily.  14.Simvastatin 80 mg daily.   Follow with Dr. Sherri Rad in 1-2 weeks.      Pedro Earls, MD  Electronically Signed     NS/MEDQ  D:  01/23/2009  T:  01/23/2009  Job:  [id number]   cc:   Dr. Sherri Rad.

## 2011-01-09 NOTE — H&P (Signed)
NAMECARLEA, BADOUR               ACCOUNT NO.:  [id number]   MEDICAL RECORD NO.:  [id number]          PATIENT TYPE:  INP   LOCATION:  5502                         FACILITY:  [facility]   PHYSICIAN:  Della Goo, M.D. DATE OF BIRTH:  Jul 17, 1946   DATE OF ADMISSION:  05/20/2008  DATE OF DISCHARGE:                              HISTORY & PHYSICAL   PRIMARY CARE PHYSICIAN:  Unassigned, Dr. Florentina Jenny.   CHIEF COMPLAINT:  Increased swelling, redness of the right lower  extremity thigh area for the past 2-[redacted] weeks along with complaints of  shortness of breath.  The patient denies having any chest pain.  She  denies having any cough, fever, chills, congestion, nausea, vomiting or  diarrhea.  The patient reports having dyspnea on exertion.  As for the  redness and swelling in her right leg,  the patient reports this has  been worsening.  She spoke to her primary care physician who advised her  to report to the emergency department for evaluation.  The patient also  had complaints of back pain.  The patient describes having bilateral  area flank pain which radiates to the abdomen for 2 weeks and she is  concerned about her kidneys.   PAST MEDICAL HISTORY:  Significant for:   1. Type 2 diabetes mellitus.  2. Congestive heart failure syndrome.  3. Hyperlipidemia.  4. Hypertension.  5. Lymphedema.  6. Morbid obesity.  7. Patient also has osteoarthritis.   PAST SURGICAL HISTORY:  Significant for:   1. A right shoulder rotator cuff repair.  2. Right knee arthroscopic surgery.   MEDICATIONS:  1. Diovan 40 mg 1 p.o. daily.  2. Lasix 60 mg 1 p.o. daily.  3. Simvastatin 80 mg p.o. q.h.s.  4. Hydrochlorothiazide 25 mg 1 p.o. daily.  5. Folic acid 1 mg 1 p.o. daily.  6. Lyrica 75 mg 1 p.o. b.i.d.  7. NovoLog sliding scale insulin 7 units subcu with meals t.i.d.  8. Hydroxyzine 10 mg 1 p.o. q.h.s.  9. Omeprazole 20 mg 1 p.o. daily.  10.Vicodin 5/500 one to two tablets p.o. q.6 h. p.r.n.  pain.  11.Ambien 5 mg p.o. q.h.s. p.r.n.  12.Ultracet 37.5/325 one p.o. q.6 h. p.r.n.  13.Phenergan 25 mg 1 p.o. b.i.d. p.r.n.   ALLERGIES:  No known drug allergies.   SOCIAL HISTORY:  Patient lives alone.  She is a nonsmoker, nondrinker.   FAMILY HISTORY:  Noncontributory.   REVIEW OF SYSTEMS:  The patient denies having any nausea, vomiting,  cough, congestion, chest pain, diarrhea.  She denies having dysuria.  Pertinent positives are as mentioned above.   PHYSICAL EXAMINATION FINDINGS:  GENERAL:  This is a morbidly obese 65-  year-old female in discomfort but no acute distress.  VITAL SIGNS:  Temperature 98.4, blood pressure 152/63, heart rate 75,  respirations 24, and O2 saturation of 100% on 3 liters nasal cannula  oxygen.  HEENT:  Normocephalic, atraumatic.  There is no scleral icterus.  Pupils  equally round, reactive to light.  Extraocular movements are intact,  funduscopic benign.  Oropharynx is clear.  NECK:  Supple, full range  of motion.  No thyromegaly, adenopathy,  jugular venous distention.  CARDIOVASCULAR:  Regular rate and rhythm.  No murmurs, gallops or rubs.  LUNGS:  Clear to auscultation bilaterally.  ABDOMEN:  Positive bowel sounds, soft, nontender, nondistended.  EXTREMITIES:  Obese, positive induration, redness in the medial aspect  of the right thigh area.  There is drainage which is serosanguineous  with scant purulent.  NEUROLOGIC:  The patient is alert and oriented x3.  There are no focal  deficits.  However, she does have generalized weakness and limited  mobility secondary to her obesity.   LABORATORY STUDIES:  White blood cell count 7.4, hemoglobin 11.0,  hematocrit 33.7, platelets 213, neutrophils 71%, lymphocytes 17%.  Sodium 143, potassium 5.1, chloride 116, bicarb 24, BUN 47, creatinine  1.7, glucose 121.   Chest x-ray revealed cardiac enlargement with chronic right hilar  enlargement, similar to an x-ray on April 14, 2005.   ASSESSMENT:  A  65 year old female being admitted with:   1. Cellulitis.  2. Shortness of breath.  3. Type 2 diabetes mellitus.  4. Hypertension.  5. Morbid obesity.   PLAN:  Patient will be admitted to telemetry area.  Cardiac enzymes will  be ordered, and nitroglycerin paste will also be started.  A D-dimer has  been ordered.  The patient has been placed on IV antibiotic therapy of  vancomycin  and ciprofloxacin.  She will continue on her regular medications, and  sliding-scale insulin coverage has been orderedwith CBG checks q.4 h.  Further workup will include pending results of patient's studies.  A  beta natriuretic peptide had been ordered, results of which did return  at less than 30.0.      Della Goo, M.D.  Electronically Signed     HJ/MEDQ  D:  05/21/2008  T:  05/21/2008  Job:  [id number]

## 2011-01-09 NOTE — Discharge Summary (Signed)
Kelly [name], DUBY               ACCOUNT NO.:  [id number]   MEDICAL RECORD NO.:  [id number]          PATIENT TYPE:  INP   LOCATION:  5523                         FACILITY:  [facility]   PHYSICIAN:  Marcellus Scott, MD     DATE OF BIRTH:  January 27, 1946   DATE OF ADMISSION:  05/20/2008  DATE OF DISCHARGE:                               DISCHARGE SUMMARY   DATE OF INTENDED DISCHARGE:  May 26, 2008.   PRIMARY MEDICAL DOCTOR:  Florentina Jenny, MD.   DISCHARGE DIAGNOSES:  1. Cellulitis of the right thigh.  2. Bilateral lower extremity chronic lymphedema.  3. Chronic kidney disease.  4. Anemia.  5. Morbid obesity.  6. Type 2 diabetes.  7. Hypertension.  8. Dyspnea probably secondary to obesity hypoventilation syndrome.  9. Severe pulmonary hypertension.   DISCHARGE MEDICATIONS:  1. Lasix 40 mg tablets, 2 p.o. daily.  2. Levaquin 500 mg p.o. daily for 1 week.  3. Diovan 60 mg p.o. daily.  4. Folic acid 1 mg p.o. daily.  5. Hydroxyzine 10 mg p.o. q.i.d.  6. Lantus 75 units subcutaneously nightly.  7. Lyrica 75 mg p.o.  b.i.d.  8. NovoLog 7 units in the morning, 10 units at lunch and dinner.  9. Omeprazole 20 mg p.o. daily.  10.Phenergan 25 mg p.o. b.i.d. p.r.n.  11.Zocor 80 mg p.o. nightly.  12.Ambien 5 mg p.o. nightly.  13.Ultracet 37.5/375 one p.o. t.i.d. p.r.n.   PROCEDURES:  1. Bilateral lower extremity venous Dopplers.  A limited study      secondary to body habitus.  No DVT identified in the common femoral      vein bilaterally.  2. Ventilation perfusion scan.  Again, limited study secondary to body      habitus.  Low probability for pulmonary embolism.  3. X-ray of the left knee.  Impression:  Tricompartmental      osteoarthritis.  No acute bony finding.  4. Chest x-ray on the 26th of September.  Impression:      a.     Cardiomegaly and pulmonary vascular congestion.      b.     Chronic prominence of the right hilum.  5. Chest x-ray on the 24th of September.  Impression:   Cardiac      enlargement with chronic right hilar enlargement, similar to that      seen on earlier study from April 14, 2005.  No definite acute      process identified.  6. Two-D echocardiogram.  Overall left ventricular systolic function      was normal.  Left ventricular ejection fraction 60-65%.  Left      ventricular wall thickness was mildly increased.  Severe pulmonary      hypertension.   LAB DATA:  BMET today with BUN 29, creatinine 1.38.  Urine culture  greater than 100,000 colonies but multiple bacterial morphocytes,  suggesting contamination.  CBC on the 26th of September:  Hemoglobin  9.9, hematocrit 29.8, white blood cell 5.9, platelets 189, hemoglobin  A1c of 5.9, TSH is 1.303.  Cardiac panel cycled and negative.  D-dimer  was elevated at  1.99.  Urinalysis was not suggestive of urinary tract  infection.  BNP was less than 30.   CONSULTATIONS:  Wound care.   HOSPITAL COURSE AND PATIENT DISPOSITION:  Please refer to the History  and Physical Note for initial admission details.  In summary, Ms.  [name] is a pleasant 65 year old female patient who lives at home.  She  has history of type 2 diabetes, heart failure, hyperlipidemia,  hypertension, chronic lower extremity lymphedema for the last 10 years,  morbid obesity, osteoarthritis who presented with 2-3 weeks history of  right lower extremity thigh area redness, swelling along with dyspnea.  She was thereby evaluated in the emergency room and assessed to have  cellulitis and admitted for further evaluation and management.  1. Cellulitis of the right posterior thigh.  Patient seems to develop      this any time her lower extremity gets swollen and she applies      pressure on the back of the thighs.  She was placed on empiric IV      vancomycin and ciprofloxacin given her history of PENICILLIN      allergy.  She has done quite well with this.  Wound Care consulted      on her and suggested Mepilex Border to be changed  weekly.  Her      oozing from that area and redness, swelling and pain have improved.      Will switch her to oral antibiotics today to complete a total 2      week course of antibiotics and then reassess.  Also to continue her      Lasix which will help in decreasing the edema of her lower      extremity and assist with healing.  2. Dyspnea which is probably secondary to her obesity hypoventilation      syndrome.  Would also consider ruling out obstructive sleep apnea      as an outpatient.  Of note, she has severe pulmonary hypertension      on echocardiogram.  A VQ scan was done and pulmonary embolism was a      low probability on that.  Since then the patient's dyspnea has      resolved.  3. Bilateral lower extremity lymphedema.  Continue diuretics and      consider adjusting them as renal functions tolerate.  4. Chronic anemia, for outpatient evaluation as deemed necessary.  5. Type 2 diabetes mellitus.  Continue home insulin.  Her HCTZ is      being discontinued secondary to her diabetes as well as chronic      kidney disease.  6. Chronic kidney disease.  Her creatinines have remained stable, but      to consider following them up periodically given she is on      diuretics.   Physical therapy evaluated her and recommended Home Health PT and aide.  Will also request an Charity fundraiser for Kelly [name].  Patient is at this  time stable but developed a low-grade temp of 100.2 degrees Fahrenheit  overnight.  Will switch her to oral Levaquin and monitor her overnight  and if she is stable she should be able to be discharged tomorrow and  follow up outpatient with her primary medical doctor.      Marcellus Scott, MD  Electronically Signed     AH/MEDQ  D:  05/25/2008  T:  05/25/2008  Job:  [id number]

## 2011-01-09 NOTE — H&P (Signed)
NAMEAMANA, BOUSKA               ACCOUNT NO.:  [id number]   MEDICAL RECORD NO.:  [id number]          PATIENT TYPE:  INP   LOCATION:  1843                         FACILITY:  [facility]   PHYSICIAN:  Vania Rea, M.D. DATE OF BIRTH:  11/01/1945   DATE OF ADMISSION:  01/22/2009  DATE OF DISCHARGE:                              HISTORY & PHYSICAL   PRIMARY CARE PHYSICIAN:  Dr. Redmond School.   CHIEF COMPLAINT:  Chest pain and headache.   HISTORY OF PRESENT ILLNESS:  This is a morbidly obese African American  lady with multiple chronic medical problems as outlined below, who has  in the past been investigated for temporal arteritis, about five years  ago and had a negative right temporal biopsy, who has also been  investigated for right-sided chest pain which has been demonstrated to  be noncardiac chest pain, who takes narcotics chronically for pain and  now presents with a history of worsening right-sided pleuritic chest  pain and severe headache on the left side of her head since this  morning.  The patient describes the pain as 10/10, unbearable.  It has  not been relieved by Vicodin.  She has had morphine in the emergency  room and pain persists.  On further evaluation in the emergency room the  patient's ESR was noted to be 105 and the hospitalist service was called  to assist with management and to consider also temporal arteritis in the  diagnosis.   The patient is quite tearful because of the pain and the pain was in  fact made worse by physical exam.  She denies any fever, cough, or cold.  She denies any respiratory difficulty.  She denies nausea, vomiting, or  diarrhea.  She denies dysuria.  She is morbidly obese but she thinks she  has been losing weight.  She had estimated her weight to be about 400  pounds and in fact about two weeks ago her weight was documented as 408  pounds, although she says she was not weighed at the time.  Today the  patient was weighed and her weight is  documented at 370 pounds.   The patient has lymphedema and chronic leg ulcerations associated with  this.  She has a wound nurse who comes in twice per week and dresses the  wound with a combination of calamine lotion, zinc, and wraps.   PAST MEDICAL HISTORY:  1. Chronic lower extremity edema.  2. Past history of renal failure.  3. Chronic bilateral lower extremity ulcers.  4. History of iron deficiency anemia.  5. Diabetes, type 2.  6. Morbid obesity.  7. Obesity hyperventilation syndrome.  8. Moderate sleep apnea.  9. Chronic right-sided chest pain.  10.Hypertension.  11.Hyperlipidemia.  12.Osteoarthritis.  13.Severe pulmonary hypertension.   MEDICATIONS:  According to the emergency room records:  1. NovoLog by sliding scale.  2. Lantus 55 units daily.  3. Diovan 60 mg daily.  4. Folic acid 1 mg daily.  5. Spironolactone 25 mg daily.  6. Nexium 40 mg daily.  7. Ambien 5 mg at bedtime.  8. Phenergan 25 mg as needed.  9. Hydroxyzine 10 mg as needed.  10.Vicodin 5 mg as needed.  11.Vitamin 50,000 units weekly.  12.Lasix 100 mg daily.  13.Iron sulfate 325 mg daily.  14.Simvastatin 80 mg daily.   ALLERGIES:  PENICILLIN.   SOCIAL HISTORY:  Denies tobacco, alcohol, or illicit drug use.  Lives at  home.  Is able to ambulate with a walker.  Was recently discharged from  a skilled nursing facility after rehab.   FAMILY HISTORY:  Noncontributory.   REVIEW OF SYSTEMS:  Other than noted above, a 10-point review of systems  was unremarkable.   PHYSICAL EXAMINATION:  GENERAL:  Morbidly obese, African American lady  lying in a bariatric bed.  VITAL SIGNS:  She gives her height as 5 feet 6 inches.  Her weight is  370 pounds.  Her temperature is 97.3, pulse 72, respirations 18, blood  pressure 137/52, saturating 100% on 2 L.  HEENT:  Her pupils are round, equal, and reactive.  Mucous membranes are  pink and anicteric.  She has no tenderness on her head and temporal  artery was  not palpated.  NECK:  No cervical lymphadenopathy.  She has a very thick neck.  Thyromegaly was not appreciated.  LUNGS:  Clear to auscultation bilaterally.  CARDIOVASCULAR:  Distant heart sounds.  Regular rhythm.  CHEST:  She has exquisite, reproducible chest wall tenderness.  ABDOMEN:  Markedly obese, soft, and nontender.  EXTREMITIES:  She has extensive lymphedema of the thighs with  hyperpigmentation of the skin and some weeping, especially on the left.  She has healing ulcers on the posterior aspect of her thighs.  She has  no edema of the feet.  Dorsalis pedis pulses are not felt but the  capillary refill is good.  CENTRAL NERVOUS SYSTEM:  Cranial nerves II-XII are grossly intact.  She  has no focal lateralizing signs.   LABORATORY DATA:  Her white count is 5.8, hemoglobin 10.5, hematocrit  31.6, MCV 80.3, platelets 279.  She has a normal differential on her  white count.  Chemistries:  Sodium is 141, potassium 3.4, chloride 109,  BUN 18, creatinine 1.4, glucose 98.  Her cardiac enzymes are completely  normal with undetectable CK-MB or troponins.  Her ESR is 105.  Urinalysis is completely bland with specific of 1.01 and negative for  nitrites, leukocyte esterase, or protein.   EKG shows normal sinus rhythm with right bundle branch block.  Chest x-  ray shows question of pulmonary venous hypertension versus early  congestive heart failure.  No infiltrate collapse or effusion.   ASSESSMENT:  1. Severe headache with elevated ESR, although the ESR could be      related to her chronic lower extremity ulcerations.  I think we      really need to admit her to rule out temporal arteritis.  2. Recurrent musculoskeletal chest pain.  3. Diabetes, type 2, controlled.  4. Hypertension, controlled.:  5. Chronic pain syndrome.  6. Morbid obesity.  7. Obstructive sleep apnea.  8. Osteoarthritis.  9. Chronic lymphedema and chronic leg ulcers.   PLAN:  We will bring this lady in for  evaluation of her Left temporal  artery, initially by duplex doppler. If doppler suggest abnormality then  we will preceed wioth biopsy.  Wil continue management of her chronic  medical problems.      Vania Rea, M.D.  Electronically Signed     LC/MEDQ  D:  01/22/2009  T:  01/23/2009  Job:  [id number]   cc:  Dr. Redmond School

## 2011-01-09 NOTE — H&P (Signed)
Kelly [name], Kelly [name]               ACCOUNT NO.:  [id number]   MEDICAL RECORD NO.:  [id number]          PATIENT TYPE:  INP   LOCATION:  5511                         FACILITY:  [facility]   PHYSICIAN:  Lonia Blood, M.D.DATE OF BIRTH:  24-Sep-1945   DATE OF ADMISSION:  11/01/2008  DATE OF DISCHARGE:                              HISTORY & PHYSICAL   PRIMARY CARE PHYSICIAN:  Unassigned.   CHIEF COMPLAINT:  Right leg pain with wound and right chest pain.   HISTORY OF PRESENT ILLNESS:  Kelly [name] is a very pleasant 37-  year-old morbidly obese female with a complex past medical history as  detailed below.  She states that she has had a 3-4 day history of right-  sided pleuritic type chest pain.  She describes the pain as located  primarily in the periphery of the right chest as well as in the base of  the right lung.  This pain is much worse with deep inhalations.  It has  at times been associated with shortness of breath.  There has been no  hemoptysis, hematemesis, cough, fevers or chills.  The patient has not  experienced similar pain in the past.  It does not appear to be related  to exertion.  There is no pain in the left side of the chest.  There is  no pain in the neck or head.  There is no radiation of pain down either  arm.   Additionally the patient presents for evaluation of a right posterior  thigh wound.  She states that she has had chronic wounds off and on, but  during her most recent hospitalization at [facility] that her wounds  were healed.  She states that during a rehab stay at a local nursing  facility, she suffered abrasions of the posterior aspect of the right  thigh and that these have worsened while she has been at home.  She  denies fevers, chills.   REVIEW OF SYSTEMS:  Comprehensive review of systems is, otherwise,  unremarkable with exception of the positive elements noted in the  history of present illness above.   PAST MEDICAL HISTORY:  1.  Chronic bilateral lower extremity wounds secondary to chronic      bilateral lower extremity lymphedema.  2. Chronic kidney disease with baseline creatinine approximately 1.5.  3. Morbid obesity.  4. Diabetes mellitus type 2.  5. Hypertension.  6. Obesity hypoventilation syndrome with a broken home CPAP machine.  7. Severe pulmonary hypertension via echocardiogram 2009 also      confirming preserved LV systolic function.  8. Hyperlipidemia.  9. Osteoarthritis.  10.Status post right rotator cuff repair.   OUTPATIENT MEDICATIONS:  1. Phenergan 25 mg p.r.n.  2. NovoLog sliding scale insulin - the patient has not been using.  3. Atarax 10 mg p.r.n.  4. Lasix 80 mg daily.  5. Lantus insulin 55 units subcu q.h.s.  6. Levaquin 500 mg daily.  7. Baclofen 10 mg t.i.d.  8. Diovan 60 mg daily.  9. Folic acid 1 mg daily.  10.Lyrica 75 mg b.i.d.  11.Prilosec 20 mg daily.  12.Zocor 80 mg q.h.s.  13.Ambien 5 mg q.h.s.   ALLERGIES:  PENICILLIN.   FAMILY HISTORY:  Noncontributory.   SOCIAL HISTORY:  The patient lives in the [place] area by herself.  She does not smoke.  She does not drink.  She has a granddaughter whom  she helps take care for on a regular basis.   DATA REVIEWED:  White count and platelet count normal, hemoglobin is low  at 10.1 with an MCV of 82.  Sodium, potassium chloride, bicarb are  normal.  BUN is elevated at 39 with an elevated creatinine at 1.53.  Serum glucose is elevated at 184. Calcium is normal.  LFTs are normal.  Albumin is low at 2.5.  Urinalysis is unrevealing.  Chest x-ray reveals  marked moderate CHF with slight asymmetric opacities in the upper lobes  left greater than right.  A 12-lead EKG reveals sinus rhythm at 84 beats  per minute with a right bundle branch block.   PHYSICAL EXAMINATION:  Temperature 97.3, blood pressure 147/70, heart  rate 97, respiratory rate 20, O2 sat is 99 on room air.  GENERAL:  Morbidly obese female in no acute  respiratory distress.  HEENT:  Normocephalic, atraumatic.  Pupils equal, round, react to light  and accommodation.  Extraocular muscles are intact bilaterally.  OC/OP  clear.  NECK:  Extremely short and obese with inability to appreciate JVD.  LUNGS:  Distant breath sounds throughout all fields with no focal  crackles or wheezes.  CARDIOVASCULAR:  Distant heart sounds without appreciable gallop or rub.  ABDOMEN:  Morbidly obese, soft, bowel sounds present.  No organomegaly,  no rebound, no ascites.  EXTREMITIES:  3+ bilateral lower extremity edema of extremely obese  lower extremities without cyanosis or clubbing.  CUTANEOUS:  With help from the patient, she has rolled into the left  lateral decubitus position.  The posterior aspect of the right  thigh/calf region exhibits 2 linear type stage II wounds.  There is no  evidence of fluctuance,  abscess formation or severe purulent drainage.  There is no significant bleeding.  NEUROLOGIC:  The patient is alert and oriented x4.  Cranial nerves II-  XII are intact bilaterally.  She displays voluntary movement of all 4  extremities.  Cranial nerves II-XII are intact bilaterally - nonfocal  neurologic exam.   IMPRESSION AND PLAN:  1. Right pleuritic chest pain - Kelly [name] is a morbidly obese 65-      year-old female who primarily resides in bed at home.  Of course,      anyone fitting this description with a severe pleuritic chest pain      must be considered to have a pulmonary embolism until proven      otherwise.  Unfortunately due to her size, a computed tomography      scan is not option.  This is further the case due to her chronic      kidney disease.  For now we have little choice but to empirically      treat her with full-dose anticoagulation using Lovenox.  D-dimer      has not been checked.  I will check this out of hope that it may in      fact be normal.  I suspect, however, that it will be elevated as      there are multiple  other reasons for this to be elevated.  Of      course, it is only helpful if it is negative  anyway.  If the D-      dimer is elevated as I suspect it will be, we will need to proceed      with a ventilation/perfusion scan for evaluation.  As noted,      however, the patient will be empirically treated due to my high      pretest probability for pulmonary embolism.  2. Acute on chronic right lower extremity wound - the patient has      severe lymphedema of both lower extremities.  She is cared for this      by Dr. Redmond School who makes house calls in the outpatient setting.  She      reports that the wounds on her right posterior leg are acute in      timing.  At the present time she does not appear to have a severe      cellulitis.  We will cover these with a Mepilex dressing for now.      We will ask our wound ostomy, continence clinical nurse specialist      to evaluate for further recommendations in the morning.  3. Severe pulmonary hypertension - the patient had a confirmed      diagnosis of severe pulmonary hypertension via echocardiogram in      2009.  Of note, she has a preserved left ventricular systolic      function.  At the present time she appears grossly volume      overloaded.  We will double her Lasix dose and administer an      intravenous.  We will follow her intake and output very closely.      We will have to follow her renal function closely as well as she      will likely exhibit aggravation of her renal insufficiency with      aggressive diuresis.  4. Diabetes mellitus - this appears to be poorly controlled with a      serum glucose of 184.  We will place the patient on sliding scale      insulin.  We will use her home dose of Lantus, but I suspect this      may require further upward titration.  5. Hyperlipidemia - we will continue Zocor.  We will check her lipid      panel in the morning.  6. Hypertension - the patient's blood pressure is reasonably      controlled at  the present time.  I will continue her Diovan for now      but with her aggressive diuresis, I will have a low threshold for      discontinuing it altogether depending on how her renal function      behaves with our diuresis.  7. Obesity hypoventilation syndrome - we will use a continuous      positive airway pressure during the patient's hospital stay as per      her home settings.      Lonia Blood, M.D.  Electronically Signed     JTM/MEDQ  D:  11/01/2008  T:  11/02/2008  Job:  [id number]

## 2011-01-09 NOTE — Discharge Summary (Signed)
NAMEHARLEY, Kelly [name]               ACCOUNT NO.:  [id number]   MEDICAL RECORD NO.:  [id number]          PATIENT TYPE:  INP   LOCATION:  5523                         FACILITY:  [facility]   PHYSICIAN:  Eduard Clos, MDDATE OF BIRTH:  [birth year]   DATE OF ADMISSION:  11/01/2008  DATE OF DISCHARGE:  11/12/2008                               DISCHARGE SUMMARY   Please refer to the discharge summary dictated by Dr. Marcellus Scott, on  November 09, 2008 for the detailed course in the hospital, procedures done  prior to this discharge summary.  In short, the patient was admitted for  pleuritic-type chest pain and chronic wounds on her right lower  extremity.  Eventually the patient also had some mild fever.  The  patient's workup for chest pain was negative, with a VQ scan and lower  extremity Dopplers being negative, and urine cultures grew E. coli which  is sensitive to nitrofurantoin.  At this time the patient's symptoms  have largely improved and the fevers have resolved.  Will be  transferring to a skilled nursing facility today.  The patient also was  found to have iron-deficiency anemia.  I did discuss with the patient  about colonoscopy.  She did say that she did have a colonoscopy but she  does not recall the exact year in which she had it, but on reviewing E  chart, it showed that the patient had one on April 16, 2003, which only  showed internal hemorrhoids, otherwise unremarkable colonoscopic  examination up to the cecum.  Anyway, I have advised the patient to have  another one through her primary care physician, for which she has  agreed.  At the time of this dictation the patient is hemodynamically  stable.   FINAL DIAGNOSES:  1. Febrile illness secondary to Escherichia coli urinary tract      infection.  2. Acute on chronic lower extremity edema, anasarca.  3. Acute renal failure secondary to diuretics and an angiotensin      receptor blocker, which has improved after  stopping these      medications.  4. Acute on chronic bilateral lower extremity wounds.  5. Iron-deficiency anemia.  6. Diabetes mellitus type 2.  7. Obesity hypoventilation syndrome or obstructive sleep apnea.  8. Morbid obesity.  9. Atypical chest pain.  Workup negative.  Resolved.  10.Hypertension.  11.Hyperlipidemia.  12.Osteoarthritis.  13.Severe pulmonary hypertension.   MEDICATIONS ON TRANSFER:  Folic acid 1 mg p.o. daily, Lyrica 150 mg p.o.  twice daily, Prilosec 20 mg p.o. daily, Zocor 80 mg p.o. at bedtime,  Ambien 5 mg p.o. at bedtime p.r.n. for sleep, Phenergan 25 mg p.o. q. 6  p.r.n. for nausea, hydroxyzine 10 mg p.o. as needed daily itching,  Lantus insulin 55 units subcutaneously at bedtime, Macrodantin 100 mg  p.o. twice daily for 5 more days and then stop, Colace 100 mg p.o.  b.i.d., Norvasc 5 mg p.o. daily, ferrous sulfate 325 mg p.o. t.i.d.,  tramadol 50 mg p.o. t.i.d.  NovoLog insulin pre meals t.i.d., 151 to  200, two units subcutaneous; 201-250, four units subcutaneously; 251-  300, six units subcutaneously; 301-350, eight units subcutaneously; 351-  400, ten units subcutaneously.   PLAN:  1. The patient will be transferred to a skilled nursing facility.  2. Recheck the patient's BMET and CBC within 3 days.  At the time of      transfer the patient's hemoglobin is 9 with a hematocrit of 26.3.      Her creatinine is 1.5.  3. The patient is to be on a cardiac-healthy, carbohydrate-modified      diet.  To check blood sugars a.c. and h.s. to be cautious of      hypoglycemia.  The patient was advised that she will need a      colonoscopy through her primary care physician, for which she has      agreed.  4. Wound care as appropriate for her thigh wounds.      Eduard Clos, MD  Electronically Signed     ANK/MEDQ  D:  11/12/2008  T:  11/12/2008  Job:  [id number]

## 2011-01-11 ENCOUNTER — Telehealth: Payer: Self-pay | Admitting: Internal Medicine

## 2011-01-11 ENCOUNTER — Other Ambulatory Visit: Payer: Self-pay | Admitting: Geriatric Medicine

## 2011-01-11 DIAGNOSIS — Z1231 Encounter for screening mammogram for malignant neoplasm of breast: Secondary | ICD-10-CM

## 2011-01-11 NOTE — Telephone Encounter (Signed)
Pt scheduled for OV with Dr. Marina Goodell for 02/22/11@9 :30am. Scheduled with Stanton Kidney with [facility]. Dr. Florentina Jenny is the referring physician. Debra to fax office notes.

## 2011-01-12 NOTE — Discharge Summary (Signed)
Kelly [name], Kelly [name]                         ACCOUNT NO.:  [id number]   MEDICAL RECORD NO.:  [id number]                   PATIENT TYPE:  INP   LOCATION:  3702                                 FACILITY:  [facility]   PHYSICIAN:  Hettie Holstein, D.O.                 DATE OF BIRTH:  04-Jul-1946   DATE OF ADMISSION:  05/06/2003  DATE OF DISCHARGE:  05/15/2003                                 DISCHARGE SUMMARY   PRIMARY CARE PHYSICIAN:  Dr. [name].   ADMISSION DIAGNOSES:  1. Chest pain.  2. Near syncope.  3. Dyspnea.   DISCHARGE DIAGNOSES:  1. No acute myocardial infarction and no pulmonary embolus, ruled out via VQ     scan and lower extremity Dopplers.  Her diagnostic studies are limited     secondary to her body habitus.  CT scanning could not be performed.  2. Hemoccult positive stools.  3. Anemia.  4. Insulin-dependent diabetes mellitus.  5. Hypercholesterolemia.   CONSULTATIONS:  Include Dr. Lucas Mallow with Cardiology and Dr. Virginia Rochester with  Gastroenterology.   PROCEDURES PERFORMED:  Nuclear medicine VQ scan, lower extremity Dopplers  and blood transfusion, status post two units pack red blood cells.   DISPOSITION:  The patient was instructed to follow up with Dr. Virginia Rochester for  capsule endoscopy on May 18, 2003 at 7:30 A.M.  For questions she  should call [phone].  She should follow up with Dr. [name] on  Friday May 21, 2003 at 11:45 A.M.  I am having copies of this  discharge summary faxed to this office at [phone].  She is instructed to  have a follow up CBC in one week with results sent to her primary care  physician.  She is sent home on oxygen via [facility].   DISCHARGE MEDICATIONS:  1. Lopressor 50 mg p.o. b.i.d.  2. Zocor 80 mg daily.  3. Prinivil 20 mg daily.  4. Lantus 26 units q.h.s./bedtime.  5. Actos 45 mg p.o. daily.  6. Glucophage 500 mg two p.o. b.i.d.  7. Niferex 150 mg p.o. daily.  8. Avalox 400 mg p.o. daily until  May 18, 2003 to complete course.  9. The patient is instructed to take Tylenol PRN.   HISTORY OF PRESENT ILLNESS:  This is a 65 year old female with past medical  history of morbid obesity, obstructive sleep apnea, cardiomyopathy of  undetermined etiology, anemia, iron deficient, with recent endoscopic  evaluation per Dr. Virginia Rochester, who is being followed up in her primary care  physician's office for lower extremity edema and worsening shortness of  breath over the past three weeks.  She states that she had been started on  fluid pills without response and recently evaluated for obstructive sleep  apnea in June in 2004 and currently is awaiting CPAP at home from a home  health agency.  She has had cardiac evaluation in the past.  She states that  she is not certain of the results.  She has not been told of any heart  disease in the past.  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  No biopsy has been performed.  She describes this as episode as almost passing out.  She states that  while being evaluated at primary care physician's office she sat up and  attempted to be transferred to bed for an electrocardiogram.  She felt very  light headed and dizzy, felt as if she was going to out.  She describes some  chest tightness, chest ache under her left breast.  This was non radiating  and was accompanied by dyspnea but no radiation.  This was relieved with two  sublingual nitroglycerin.  The pain had subsided upon emergency department  arrival and initial supportive care did not reveal evidence of congestive  heart failure or elevated cardiac markers.  Her vital signs were stable.  She was found to be quite anemic, however.  Her oxygen saturation was 95% on  room air as well.  She was not tachycardiac.  She was pain-free.  She  described, however, worsening lower extremity edema not improving with Lasix  as an outpatient.   HOSPITAL  COURSE:  The patient was planned for a CT scan to evaluate her  abnormal chest x-rays as well as to rule out pulmonary embolus.  This could  not be performed secondary to her weight, which exceeded the scanner  limitations.  VQ scan was then requested and lower extremity Dopplers, which  were not suggestive of PE.  Subsequently it was found that she was anemic  and with her history of recent upper and lower endoscopies per Dr. Virginia Rochester, she  was given iron transfusions while in the hospital.  She was started on  heparin prior to obtaining the CT scan with clinical possibility of  pulmonary embolus.  This was discontinued once these studies were found to  be negative.  She was placed on her home medications.  She was started on  Cipro for urinary tract infection.  Diagnostic evaluations were limited  secondary to her body habitus.  Dr. Lucas Mallow was asked to see her secondary to  the abnormal echocardiography that was initially noted in March of 2004  suggesting a decreased or depressed left ventricular function, however, a  repeat echocardiogram revealed overall left ventricular systolic function  was probably normal although no diagnostic left ventricular region wall  motion abnormalities were identified, although this possibility cannot not  be completely excluded on the base of this study.  There was mild mitral  annular calcification. Due to the patient's body habitus this was labeled as  limited and difficult study.  Dr. Lucas Mallow was asked to provide input with  regard to these findings and his recommendation was that Norvasc and Actos  be discontinued.  Dr. Pollie Friar medications were noted and also requested a  copy of this be sent to primary care physician, Dr. [name].  In  any event, the patient remained stable, respiratory-wise, oxygen was  arranged for her at home and it was found that her hemoglobin drifted down slowly and she was again noted to have Hemoccult positive stools while  in  the hospital.  Once again Dr. Virginia Rochester was asked to evaluate her and at that time  his recommendations were that she undergo capsule endoscopy.  She was  transfused two units of packed red blood cells  with good post H and H  response.  She underwent transfusion of one unit of packed red blood cells  on May 12, 2003 and once again on May 14, 2003 she underwent two  units blood transfusion with her hemoglobin increasing from 9.8 to 11.4.  Subsequently it was felt she was stable for discharge home and follow up  with Dr. [name] on May 21, 2003 at 11:45 A.M.   LABORATORY DATA:  Laboratory studies following her discharge revealed she  underwent protein electrophoresis which revealed a total urine protein of  4887 mg/DL with reference range being from 10 to 140.  A basic metabolic  panel revealed a sodium 140, potassium 4.1, chloride 106, cO2 28, glucose  128, BUN 14, creatinine 0.8, calcium 8.9.  Final CBC revealed white blood  cell count 7.9, hemoglobin 11.4, hematocrit 34.8, platelet count 231,000.  Hemoccult positive stools.  She was noted to have elevated sed rate of  undetermined etiology. The patient had a sed rate of 101.  Her BNP on  admission was less than 30.  Cardiac enzymes were negative.                                                Hettie Holstein, D.O.    ESS/MEDQ  D:  05/17/2003  T:  05/17/2003  Job:  [id number]   cc:   [name], M.D.  [address]  [place]  [place] [address]  Fax: [phone]   Georgiana Spinner, M.D.  [address]  [place]  [place] [address]  Fax: [phone]   Jaclyn Prime. Lucas Mallow, M.D.  [address] [place] [address]  [place]  [place] [address]  Fax: [phone]

## 2011-01-12 NOTE — H&P (Signed)
Kelly [name], VALCARCEL                         ACCOUNT NO.:  [id number]   MEDICAL RECORD NO.:  [id number]                   PATIENT TYPE:  INP   LOCATION:  0342                                 FACILITY:  [facility]   PHYSICIAN:  Brooke Bonito, M.D.                   DATE OF BIRTH:  01-13-1946   DATE OF ADMISSION:  11/15/2002  DATE OF DISCHARGE:                                HISTORY & PHYSICAL   CHIEF COMPLAINT:  Chest pain.   HISTORY OF PRESENT ILLNESS:  This is a 65 year old African-American female  who is morbidly obese, and a patient of [name], M.D., with a  history of chest pain off and on for years.  This is a tight feeling and has  mainly been mild.  It was much worse yesterday and today, it comes and goes  frequently and lasts about 10 to 15 minutes.  Relaxing makes it better,  exertion makes it worse or brings it on.  The chest pain is in the mid chest  or a little to the left radiating to the left neck and to the left face.  It  did produce nausea, shortness of breath, and diaphoresis.  It is a tight  heavy pain and has been severe since yesterday.  It was worse this morning  while taking a shower, so the patient came to the emergency room.  Due to  the patient's weight and difficulty of assessing her, she will be kept  overnight to rule out myocardial infarction.   PAST MEDICAL HISTORY:  1. Morbid obesity.  2. Non-insulin dependent diabetes mellitus.  3. Diagnosis of sarcoidosis of the lungs which she was told about several     years ago.  4. Palpitations.  5. Hypertension.  6. Hyperlipidemia.  7. Peripheral edema.  8. History of smoking.  9. History of a right shoulder injury with arthritis.   PAST SURGICAL HISTORY:  Arthroscopies of the right knee and right shoulder.   ALLERGIES:  PENICILLIN.   MEDICATIONS:  1. Lipitor.  2. Norvasc.  3. Glucovance.  4. Lotensin with hydrochlorothiazide.  5. Aspirin.   FAMILY HISTORY:  The patient's father died in  his 44's from lung disease.  Her mother is still alive at 77, and has a pacemaker and hypertension.  One  sister has heart problems and a hiatal hernia.  Her brother has  hypercholesterolemia.   SOCIAL HISTORY:  The patient does not smoke, she does not use drugs, she  does not use alcohol.  She has been exercising lately in order to lose  weight.   REVIEW OF SYMPTOMS:  CONSTITUTIONAL:  The patient has had some fevers  lately, but denies chills.  She had sweating yesterday and today.  Her  weight is up, perhaps as much as 500 pounds.  She has edema in her lower  legs and she does not sleep well.  EYES:  The patient denies diplopia,  blurring, contacts, glaucoma, or cataracts.  She does wear glasses.  EARS,  NOSE, MOUTH, AND THROAT:  The patient says she has had some problem with her  ears lately with pain in her ears, and some hardness of hearing.  She also  has a ringing in her ears most of the time.  She has some rhinorrhea and  sneezes a lot.  She denies a lost of taste or that she has sores in her  mouth.  She has a partial upper plate.  CARDIOVASCULAR:  Chest pain is  noted.  The patient has frequent fluttering palpitations and has dyspnea  with some orthopnea.  She also has painful ambulation which may represent  claudication.  RESPIRATORY:  The patient coughs a lot and produces a clear  sputum.  She wheezes a lot.  She does not smoke now.  She does snore, and  she is uncertain if she has apnea when she is asleep.  GASTROINTESTINAL:  The patient denies dysphagia, but she does have nausea only when  indigestion.  She alternates between diarrhea and constipation.  GENITOURINARY:  The patient denies dysuria, pyuria, hematuria, anuria,  hesitation, or incontinence.  She does have urinary frequency and is up to  the bathroom perhaps as many as two times a night.  She is postmenopausal.  MUSCULOSKELETAL:  The patient has painful joints in her arms, knees, and  hands.  She has some leg  cramps in the past.  She is frequently fatigued and  walks with difficulty.  She denies any recent falls.  SKIN:  The patient  denies rashes or other skin problems.  BREASTS:  The patient denies masses,  lumps, tenderness, or discharge of the breast.  NEUROLOGIC:  The patient  denies faintness, syncope, seizures, or signs and symptoms of a stroke.  She  does have dizziness.  She has numbness in her hands and does have some  headaches.  PSYCHIATRIC:  The patient says that she is both depressed and  anxious, and she does not have anorexia or hallucinations.  ENDOCRINE:  The  patient denies thyroid disease, excessive thirst, excessive hunger, or  excessive urine volume output.  She does have non-insulin dependent diabetes  mellitus.  HEMATOLOGIC:  The patient bruises easily, but she does not bleed  easily.  LYMPHATIC:  The patient denies adenopathy of the neck, axillae, and  groin.  ALLERGIC:  The patient is allergic to penicillin, but denies any non-  drug allergies.  ALL OTHER SYSTEMS:  All other systems are negative.   PHYSICAL EXAMINATION:  VITAL SIGNS:  Temperature is 97 degrees Fahrenheit  taking orally.  The patient's pulse is 83, respirations are 16, blood  pressure 135/45, her weight is somewhat between 400 to 500 pounds, she goes  to a weight loss center in order to obtain an accurate weight.  Her pulse  oximetry was 98%.  Her age is 65.  On telemetry, she is normal sinus rhythm  to sinus tachycardia.  GENERAL:  The patient is a well-developed, well-nourished, obese black  female in no acute distress.  PSYCHIATRIC:  The patient is pleasant, cooperative, and responds  appropriately.  HEENT:  Normocephalic, atraumatic.  Pupils equal, round, reactive to light  and accommodate and are 2 mm in diameter.  Extraocular movements were  intact.  Her mouth is moist.  Her oropharynx is benign. NECK:  The patient's neck is supple with a midline trachea.  She is without  bruit or thyromegaly.  Her neck is obese and difficult to assess.  CHEST:  The patient's breathing is eupneic and she is clear to auscultation  and percussion, with only a little exertion she does become tachypneic.  BREASTS:  The patient's breasts are obese, but without discharge or  tenderness.  ADENOPATHY:  The patient is without palpable cervical adenopathy.  CARDIAC:  The patient has a regular rate and rhythm.  S1 and S2 are very  distant.  No murmurs, rubs, gallops, or clicks are heard.  ABDOMEN:  The patient's abdomen is morbidly obese.  She does have positive,  but distant bowel sounds.  Her abdomen is soft and nontender.  GENITOURINARY:  The patient is postmenopausal and has a Foley catheter  inserted.  She is nontender over her bladder.  EXTREMITIES:  The patient moves all extremities x4.  Her strength is 4/4 in  her upper and lower extremities.  She has positive lower leg edema.  Difficult to assess due to the obesity of her legs.  SKIN:  The patient's skin is warm and dry without jaundice, cyanosis,  pallor, or rashes.  She has a brisk capillary refill.  NEUROLOGIC:  The patient is conscious, alert and oriented to person, place,  time, and situation.  Cranial nerves II-XII are grossly intact.   IMPRESSION:  1. Chest pain, rule out myocardial infarction.  Having symptoms of shortness     of breath, diaphoresis, nausea, as well as chest pain.  2. Non-insulin dependent diabetes mellitus.  3. Morbid obesity.  4. Peripheral edema.  5. Hypertension.  6. Hyperlipidemia.  7. History of sarcoidosis diagnosis.   PLAN:  1. Admit to telemetry to rule out myocardial infarction.  2. Serial enzymes and EKG's.  3. Admission labs, chest x-ray, and EKG.  4. Spiral CT scan which could not be done unfortunately due to the patient's     weight.  5. Home medications.  6. Anticoagulation using heparin by pharmacy protocol.  7. Sliding scale insulin to cover blood sugars.  8. Nitroglycerin drip ordered if  needed.     Arletha Pili. Virgina Norfolk, M.D.    LMK/MEDQ  D:  11/15/2002  T:  11/16/2002  Job:  [id number]

## 2011-01-12 NOTE — H&P (Signed)
Kelly [name], Kelly [name]                         ACCOUNT NO.:  [id number]   MEDICAL RECORD NO.:  [id number]                   PATIENT TYPE:  INP   LOCATION:  3702                                 FACILITY:  [facility]   PHYSICIAN:  Hettie Holstein, D.O.                 DATE OF BIRTH:  December 12, 1945   DATE OF ADMISSION:  05/06/2003  DATE OF DISCHARGE:                                HISTORY & PHYSICAL   PRIMARY CARE PHYSICIAN:  Dr. Nicholos Johns.   CHIEF COMPLAINT:  Chest pain, shortness of breath, near syncope.   HISTORY OF PRESENT ILLNESS:  This is a 65 year old female with past medical  history of morbid obesity, obstructive sleep apnea, cardiomyopathy of  undetermined etiology, and anemia-iron deficiency-with recent endoscopic  evaluation per Dr. Virginia Rochester who was being followed up in her primary care  physician's office for lower extremity edema and worsening shortness of  breath over the past three weeks. She states that she had been started on  fluid pills without response and recently evaluated for obstructive sleep  apnea in June 2004 and currently awaiting CPAP at home from home health  agency. She has had cardiac evaluation in the past. She states she is not  certain of the results. She has not been told of any heart disease in the  past. She has had several imaging studies, plain x-rays with mention of  sarcoid and hilar lymphadenopathy. No biopsy has been performed. She  describes this episode as almost passing out. She states that while being  evaluated at her primary care physician's office she sat up and attempted to  be transferred to bed for an EKG. She felt very lightheaded and dizzy, felt  as though she was going out. She describes some chest tightness, chest ache  under her left breast. This was nonradiating and was accompanied by dyspnea  but no radiation. This was relieved with two sublingual nitroglycerin. The  pain had subsided upon emergency department arrival. Initial point of  care  did not reveal evidence of CHF or elevated cardiac markers. Her vital signs  were stable. She was found to be quite anemia; however, her oxygen  saturation was 95% on room air. She was not tachycardia. She was pain free.  She describes, however, worsening lower extremity edema, not improving with  Lasix as an outpatient.   PAST MEDICAL HISTORY:  1. Iron-deficiency anemia of uncertain etiology. She has undergone upper and     lower endoscopies revealing only internal hemorrhoids. She had an EGD and     colonoscopy performed by Dr. Virginia Rochester on April 16, 2003.  2. She also has a history of decreased left ventricular ejection     fraction/cardiomyopathy by echocardiogram performed in March 2004. She     reports a history of stress test two years ago. She states that this is     normal; however, there results are available to  me at this time.  3. She has a history of abnormal chest x-rays in the past with interstitial     lymphadenopathy mentioned. No prior biopsies or pulmonary evaluation have     been undertaken.  4. History of morbid obesity.  5. History of obstructive sleep apnea.  6. History of noninsulin-requiring diabetes.  7. History of hyperlipidemia.  8. History of internal hemorrhoids.  9. History of heme positive stools.   PAST SURGICAL HISTORY:  The patient's only surgeries are arthroscopic  surgeries of her knee and shoulder.   MEDICATIONS:  1. Lipitor 40 mg daily.  2. Norvasc 10 mg daily.  3. Glucovance 5/500 two tablets b.i.d.  4. Detrol LA 4 mg q.d.  5. Actos 45 mg q.d.  6. Lantus 25 units q.h.s.  7. Niferex; she was prescribed but has not been able to afford.  8. Hydrochlorothiazide 25 mg daily.  9. Aspirin 81 mg daily.  10.      Potassium chloride 10 mEq daily.  11.      Lasix 40 mg daily.  12.      Nexium 40 mg daily.  13.      Lisinopril 20 mg daily.   ALLERGIES:  PENICILLIN.   FAMILY HISTORY:  Significant for her father died in his 25s from lung   disease and mother with history of heart disease. Sister with heart problems  as well, and currently, the patient does not smoke; however, she smoked but  quit 16 years ago. She does not use alcohol. She lives with her sister.   REVIEW OF SYSTEMS:  GENERAL:  The patient has experienced no fevers, chills,  night sweats, no weight loss, no appetite change. HEENT:  No headaches. No  vision or hearing change. CARDIOVASCULAR:  The patient reports new onset of  chest pain as described above and shortness of breath with history of  presenting illness as well as lower extremity edema. She has seen [facility] in the past. Reports negative stress test. PULMONARY:  She has several radiology reports with mention of hilar adenopathy and  diffuse interstitial lung disease and the mention of sarcoid. Chest x-ray in  March mentioned cardiomegaly with pulmonary vascular congestion and hilar  adenopathy. She denies any hemoptysis or productive cough. GASTROINTESTINAL:  She denies any gross per stool or any coffee-ground emesis or melena. She  has undergone upper and lower endoscopies by Dr. Virginia Rochester. GENITOURINARY:  She  does have some urinary incontinence that is helped with Detrol. No burning  on urination. MUSCULOSKELETAL:  She does report some knee pain. No swelling  or recent trauma. EXTREMITIES:  She does mention she has worsening edema of  her lower extremities, predominantly in her inner thighs.   PHYSICAL EXAMINATION:  VITAL SIGNS:  Blood pressure 129/63, heart rate 84,  respirations 22 to 24, temperature 97.2, pulse oximetry 95% on room air.  GENERAL:  The patient is alert, morbidly obese, no acute distress, pleasant.  CARDIOVASCULAR:  Heart sounds are distant. S1 and S2 are audible.  PULMONARY:  Auscultation is clear, though breath sounds are diminished. No  crackles at the bases and no wheeze. ABDOMEN:  Obese, large pannus is noted. No palpable tenderness.  EXTREMITIES:  Reveal mid  thigh remarkable overhanging fat without evidence  of underlying infection. Peripheral pulses are palpable. There is bipedal  edema.  NEUROLOGICAL:  The patient is euthymic. Affect stable. There are no focal  neurologic deficits on examination.   LABORATORY DATA:  WBCs  6.4, hemoglobin 7.9, hematocrit 25, platelet count  283, MCV 74. Cardiac markers at 2 p.m. are negative. Sodium 144, potassium  3.9, chloride 111, CO2 27, BUN 16, creatinine 1.0, glucose 84. AST/ALT  17/18, total bilirubin 0.5. EKG revealed normal sinus rhythm with incomplete  right bundle; no ST abnormality suggestive of acute MI. Chest x-ray reveals  diffuse lung opacity that is decreased as mostly interstitial.   IMPRESSION AND PLAN:  1. Near syncope. At this time, we are going to rule out acute ischemic     event, monitor on telemetry, and check cardiac markers as well as ask     cardiology to evaluate for this depressed left ventricular ejection     fraction.  2. Chest pain. As noted above, we will rule her out for acute ischemic event     as well as rule her out for pulmonary embolus, as CT is being performed     in the emergency department. She will be placed on heparin nomogram until     the results are available.  3. Anemia, microcytic. She has undergone evaluation. We will transfer her at     this time as this may be a contributing factor to her profound dyspnea;     however, she is not hypoxic. She is tachypneic and symptomatic.  4. Interstitial infiltrates/opacities on chest x-ray with questions of     sarcoid. We are going to explore this and hopefully get some information     from this CT scan and perhaps ask pulmonary to see her. Would not     surprising if she had chronic pulmonary artery hypertension. We will     explore this with 2-D echocardiogram; however, secondary to her body     habitus, this will be limited study as it was in March. We may ask the     opinion of Dr. Aleen Campi as to whether or not a  TEE would be indicated in     this situation, as if she does have a pulmonary artery hypertension, low     dose Coumadin may be warranted.  5. Morbid obesity. Implications were discussed with the patient in the     emergency department, and it was discussed that after discharge she     should address this with her primary care physician which states that she     has initiated already.  6. Obstructive sleep apnea. Currently awaiting CPAP machine at home. We may     provide this for her while she is here in the hospital. Until this is     available, we will continue supplemental oxygen continuously.  7. Dyspnea, multifactorial. Will explore the etiologies and attempt to     address these individually. We may ask the assistance of a pulmonologist     which she has not seen in the past.  8. Cardiomyopathy. As noted above, we will ask Dr. Aleen Campi to see. 9. Diabetes mellitus. We will continue the patient's home regimen. Due to     the possibility that she may undergo a contrast study, we will hold her     Glucophage, and we will decrease her home Lantus dose with the     anticipation she may be NPO. We should resume this once she resumes her     typical diet.  10.      Lipids. We are going to continue her Lipitor.  11.      Internal hemorrhoids.  12.  History of heme positive stools. She has undergone endoscopy as per     Dr. Virginia Rochester in the past. We may ask him to see her again. She may have an     underlying arteriovenous malformation. In any event, she does in this     acute setting need to be anticoagulated. If she needs blood transfusion,     we will support her; however, until acute pulmonary embolus is ruled out     or acute ischemic event, we will continue her on heparin. With regard to     her iron-deficiency anemia, we will check the iron studies and anticipate     InFeD infusions with proceeding test dose.                                                Hettie Holstein, D.O.     ESS/MEDQ  D:  05/06/2003  T:  05/07/2003  Job:  [id number]

## 2011-01-12 NOTE — Op Note (Signed)
   [name], Kelly [name]                         ACCOUNT NO.:  [id number]   MEDICAL RECORD NO.:  [id number]                   PATIENT TYPE:  AMB   LOCATION:  ENDO                                 FACILITY:  [facility]   PHYSICIAN:  Georgiana Spinner, M.D.                 DATE OF BIRTH:  22-Nov-1945   DATE OF PROCEDURE:  DATE OF DISCHARGE:                                 OPERATIVE REPORT   PROCEDURE:  Upper endoscopy.   INDICATIONS:  Hemoccult positivity.   ANESTHESIA:  Demerol 60 mg, Versed 6 mg.   DESCRIPTION OF PROCEDURE:  With the patient mildly sedated in the left  lateral decubitus position, the Olympus videoscopic endoscope was inserted  in the mouth and passed under direct vision through the esophagus, which  appeared normal, into the stomach.  The  fundus, body, antrum, duodenal bulb  and second portion of the duodenum all appeared normal.  From this point,  the endoscope was slowly withdrawn taking circumferential views of the  duodenal mucosa until the endoscope then pulled back into the stomach,  placed in retroflexion and viewed the stomach from below.  The endoscope was  then straightened, withdrawn, taking circumferential views of the remaining  gastric and esophageal mucosa.  The patient's vital signs and pulse oximetry  remained stable.  The patient tolerated the procedure well without apparent  complication.   FINDINGS:  Unremarkable endoscopic examination.  Proceed to colonoscopy as  planned.                                                Georgiana Spinner, M.D.    GMO/MEDQ  D:  04/16/2003  T:  04/16/2003  Job:  [id number]

## 2011-01-12 NOTE — Discharge Summary (Signed)
[name], [name]               ACCOUNT NO.:  [id number]   MEDICAL RECORD NO.:  [id number]          PATIENT TYPE:  INP   LOCATION:  5001                         FACILITY:  [facility]   PHYSICIAN:  Corwin Levins, M.D. LHCDATE OF BIRTH:  September 10, 1945   DATE OF ADMISSION:  01/28/2006  DATE OF DISCHARGE:                                 DISCHARGE SUMMARY   DISCHARGE DIAGNOSES:  1.  Right thigh cellulitis with superficial ulceration.  2.  Failure to thrive with deconditioning.  3.  Diabetes mellitus.  4.  Hypertension.  5.  Morbid obesity.  6.  Iron deficiency anemia.  7.  Obstructive sleep apnea.  8.  Hyperlipidemia.   PROCEDURES:  None.   CONSULTS:  Physical therapy and social services.   HISTORY AND PHYSICAL:  See that dictated day of admission by Dr. Felicity Coyer.   HOSPITAL COURSE:  [name] is a very nice 65 year old black female who  presented with right thigh cellulitis medial and posterior with very shallow  ulceration noted.  She was treated with IV antibiotics and physical therapy  as well as social services obtained due to her inability to ambulate and  move about in a manner such that she would be able to take care of herself  on discharge.  Of note, her blood pressure and CBG remained relatively well  controlled throughout her hospitalization despite the cellulitis.  There  were no other new symptoms noted.  The cellulitis rapidly improved.  She was  changed to oral Keflex.  She was noted to have mild anemia, mild iron  deficient, with discharge hemoglobin 9.8.  She was evaluated by physical  therapy and felt best for skilled nursing facility for temporary purposes.  As she was open to skilled nursing facility this was pursued and she has an  offer for a rehab skilled nursing facility in [place] for today.  She was  doing otherwise well, afebrile, vital signs stable, eating well, and able to  cooperative with physical therapy with near resolution of her cellulitis and  normal white blood cell count.  She was felt to have gained maximum benefit  from this hospitalization and is to be discharged to the skilled nursing  facility in [place].   DISPOSITION:  Discharged to SNF in [place] as above.   DISCHARGE MEDICATIONS:  1.  Keflex 500 mg p.o. t.i.d. for 7 days.  2.  Baby aspirin 81 mg p.o. daily.  3.  Lasix 40 mg p.o. b.i.d.  4.  Klor-Con 10 mEq p.o. daily.  5.  Isosorbide 30 mg p.o. daily.  6.  Diovan 160 mg p.o. daily.  7.  Bumex 1 mg p.o. b.i.d.  8.  Lisinopril 2.5 mg p.o. b.i.d.  9.  Lyrica 75 mg b.i.d.  10. Lipitor 40 mg p.o. daily.  11. Ferrex 150 one p.o. daily.  12. Lantus 75 units subcu q.h.s.  13. Humalog t.i.d. dosing 03/10/16.   She will follow up post skilled nursing facility with Dr. Cato Mulligan in 1-2  weeks.  There are no other activity restrictions.  She is to follow a  diabetic diet as  before.           ______________________________  Corwin Levins, M.D. [facility]     JWJ/MEDQ  D:  02/01/2006  T:  02/01/2006  Job:  [id number]

## 2011-01-12 NOTE — Procedures (Signed)
Kelly [name], Kelly [name]               ACCOUNT NO.:  [id number]   MEDICAL RECORD NO.:  [id number]          PATIENT TYPE:  OUT   LOCATION:  SLEEP CENTER                 FACILITY:  [facility]   PHYSICIAN:  Coralyn Helling, MD        DATE OF BIRTH:  12/16/1945   DATE OF STUDY:  01/06/2009                            NOCTURNAL POLYSOMNOGRAM   REFERRING PHYSICIAN:  Leslye Peer, MD   REFERRING PHYSICIAN:  Leslye Peer, MD   INDICATIONS:  Kelly [name] is a 65 year old female who has a previous  diagnosis of obstructive sleep apnea, but has been off CPAP for the last  several years.  She also has a history of diabetes, hypertension, and  secondary pulmonary hypertension.  She is referred to the sleep lab for  further evaluation of her obstructive sleep apnea.   Epworth score is 14.   Height is 5 feet 6 inches, weight is 408 pounds.  BMI is 66.  Neck size  is 14 inches.   MEDICATIONS:  Folic acid, Lyrica, omeprazole, simvastatin, Ambien,  promethazine, hydroxyzine, Colace, amlodipine, ferrous sulfate,  tramadol, NovoLog, Lantus, furosemide, Klor-Con, and oxycodone.   SLEEP ARCHITECTURE:  This patient followed a split night study protocol.  During the diagnostic portion of the test, total recording time was 135  minutes.  Total sleep time was 128 minutes.  Sleep efficiency was 95%.  Sleep latency was 6.5 minutes.  REM latency was 83 minutes.  This  portion of the study was notable for lack of slow wave sleep.  The  patient slept exclusively in the supine position.  During the titration  portion of the test, total recording time was 258 minutes.  Total sleep  time was 244 minutes.  Sleep efficiency was 95%.  Sleep latency was 30  seconds.  REM latency was 106 minutes.  This portion of the study was  notable for lack of slow wave sleep.  The patient slept exclusively in  the supine position.   RESPIRATORY DATA:  The average respiratory rate was 16.  Moderate  snoring was noted by the technician.   During the diagnostic portion of  the test, the overall apnea-hypopnea index was 15.4.  The respiratory  disturbance index was 22.9.  The events were exclusively obstructive in  nature.  The REM apnea-hypopnea index was 47.5.  The non-REM apnea-  hypopnea index was 7.1.  During the titration portion of the study, the  patient was titrated from CPAP of 5-10 cm of water.  At a CPAP pressure  setting of 9 cm of water, the apnea-hypopnea index was reduced to 7.  At  this pressure setting, the patient was observed in REM sleep and supine  sleep and snoring was eliminated.  Of note is that at higher pressures,  the patient appeared to have development of some central apneic events.   OXYGEN DATA:  The baseline oxygenation was 98%.  The oxygen saturation  nadir was 77%.  At a CPAP pressure setting of 9 cm water, the oxygen  saturation nadir was 90%.  The study was conducted without the use of  supplemental oxygen.   CARDIAC DATA:  The baseline blood pressure is 148/88.  The average heart  rate was 77 and the rhythm strip showed normal sinus rhythm with  occasional PVCs.   MOVEMENT/PARASOMNIA:  The periodic limb movement index was 0 and the  patient had no resting trips.   IMPRESSION:  This study shows evidence for moderate obstructive sleep  apnea with an apnea-hypopnea index of 15.4 and an oxygen saturation  nadir of 77%.  The patient was titrated to a CPAP pressure setting of 9  cm of water with good control of her respiratory events and  stabilization of her oxygenation.  Of note is that she did not require  the use of supplemental oxygen.   In addition to recommendation regarding weight loss, the patient should  be started on CPAP at 9 cm of water and monitored for her clinical  response.      Coralyn Helling, MD  Diplomat, American Board of Sleep Medicine  Electronically Signed     VS/MEDQ  D:  01/06/2009 16:42:56  T:  01/07/2009 03:25:52  Job:  [id number]   cc:   Leslye Peer,  MD  [address]  [place], [place] [address]

## 2011-01-12 NOTE — Consult Note (Signed)
NAMEBRADLEY, Kelly [name]                         ACCOUNT NO.:  [id number]   MEDICAL RECORD NO.:  [id number]                   PATIENT TYPE:  INP   LOCATION:  3702                                 FACILITY:  [facility]   PHYSICIAN:  Jaclyn Prime. Lucas Mallow, M.D.                DATE OF BIRTH:  21-May-1946   DATE OF CONSULTATION:  05/07/2003  DATE OF DISCHARGE:                                   CONSULTATION   REFERRING PHYSICIAN:  Hettie Holstein, D.O.   PRIMARY CARE PHYSICIAN:  [name], M.D.   REASON FOR CONSULTATION:  I appreciate the opportunity to participate in the  care of the 65 year old Kelly [name], a patient of Drs. Nolon Rod, by providing consultative services at Dr. Samantha Crimes request in  regard to her chest discomfort.   The patient was seen in Dr. Carolyn Stare office on the day of admission  and noted to have chest discomfort which was questionably responsive to  nitroglycerin, and was admitted to rule out unstable angina.  She said that  over the past 3-4 weeks, she has noted progressive and severe swelling in  her legs and thighs, and marked worsening of her dyspnea at rest and on  exertion.  She has also been anemic lately and underwent upper endoscopy  recently looking for a source of bleeding; apparently none was found.  Because of her discomfort in the office, she was admitted. She says that she  had a feeling of tightness in her chest off and on, associated with  shortness of breath for several days prior to admission.   PAST MEDICAL HISTORY:  Morbid obesity, non-insulin-dependent diabetes  mellitus, pulmonary sarcoidosis, hypertension, hyperlipidemia, peripheral  edema, history of an abnormal echocardiogram in March 2004, right shoulder  injury with arthritis, anemia which appears to be progressive recently and  prior arthroscopies of the right shoulder and right knee.   MEDICATIONS:  At the time of this admission, she was being treated with  Glucovance and Norvasc, as well as Actos and Atenolol.  Also, she  was on  Lotensin/HCTZ.   FAMILY HISTORY:  Her father died in his 4's of lung disease. Her mother had  slow palpitating heart and now has a pacemaker.  Her grandmother had heart  disease but had no diagnosis.  A sister has a heart problems, hiatal hernia  and a brother has hyperlipidemia.   SOCIAL HISTORY:  She lives at home with her sister. She has been prescribed  a CPAP device, but apparently although the prescription for this is said to  have been submitted in June, she has not yet received that device (perhaps  getting that arranged would be a benefit of this admission).   REVIEW OF SYSTEMS:  GENERAL:  She denies fever, chills or sweating except  with marked dyspnea.  EYES:  She wears glasses.  EARS, NOSE AND THROAT:  She  has  partial dentures.  She denies dysphagia.  CARDIOVASCULAR:  See history  of present illness.  RESPIRATORY:  She is bothered by dyspnea at rest and on  exertion. She says that she is up and around in her house, however.  GASTROINTESTINAL:  She denies.  GENITOURINARY:  She is on Detrol for  problems with her bladder.  MUSCULOSKELETAL:  She has pain in her joints,  particularly her knees.  SKIN/BREASTS:  No rash or nodule. NEUROLOGIC:  No  syncope or seizure.  PSYCHIATRIC:  No depression or hallucination.  ENDOCRINE:  Diabetes as mentioned above.  She has no known thyroid disease.  No swelling in the neck, axillae or groin.  ALLERGIC/LYMPHATIC:  She has  history of REACTION TO PENICILLIN, which probably caused angioedema.  She  smoked up to 17 years ago and has not smoked since.  All remaining systems  and her comprehensive 14 systems review are negative.   PHYSICAL EXAMINATION:  VITAL SIGNS:  Blood pressure 170/96, respirations 24  and mildly labored, heart rate 90 and regular.  GENERAL:  She is a well-developed, well nourished morbidly obese woman who  weighs something like 450 pounds.  She is  oriented to person, place and  time. Her mood and affect are quite pleasant.  HEENT:  Conjunctivae and lids without xanthelasmas, icterus or arcus  senilis. She has partial dentures. The oral mucosa revealed no pallor or  cyanosis.  NECK:  Supple and symmetrical.  Trachea is midline and mobile.  The thyroid  gland is not palpable. There is no palpable cervical node, jugulovenous  distention or carotid bruit.  LUNGS:  Her respiratory effort is increased. She is not able to sit up for  auscultation of the back or examination of the back. Her lungs are clear by  auscultation from the sides.  CARDIOVASCULAR:  Apical impulse is cryptic.  Heart sounds are inaudible.  DIGITS AND NAILS:  No clubbing or cyanosis.  SKIN/SUBCUTANEOUS TISSUE:  No stasis dermatitis or ulcer.  ABDOMEN:  Obese and nontender. There is no palpable enlargement of the liver  or spleen. Abdominal aorta is not palpable and there is no bruit.  Femoral  arteries are not palpable and there is no bruit.  EXTREMITIES:  Gait is not tested.  Muscle strength and tone are age  appropriate.  Pedal pulses are not palpable.  Legs reveal 1-2+ edema.   OBJECTIVE LABORATORY DATA:  A complete set of cardiac enzymes are negative.  She had a single EKG which shows no ST segment abnormality. She had a 2-D  echo in March. This was interpreted as showing a 30-40% ejection fraction.  Fortunately, the original images are available still. I disagree with the  interpretation. If the test is interpretable, then her left ventricular  ejection fraction is normal.  I cannot exclude an abnormal ejection fraction  but I do not believe the results demonstrate this.   DISCUSSION:  There are several considerations here.  At this point, we have  no objective evidence of myocardial ischemia as the cause of her chest  discomfort, either from an EKG which was taken after several days of chest discomfort and so might be expected to show some effect at that  point, nor  by a complete set of enzymes.   The patient presented with a hemoglobin of approximately 8, and marked  microcytosis, and severe dyspnea with extensive peripheral edema.  She has  been on at least 2 drugs which can worsen edema, namely Norvasc and Actos.  The consideration of whether to do a heart catheterization here rests on  several bases.  I think such a test would entail considerable risk for her,  given her bodily habitus. Therefore, the choice of doing the test should be  dependent on objective evidence that we are likely to find something of  importance.  The enzymes are negative and the EKG is negative.  The  echocardiogram, if it truly demonstrated severe left ventricular  dysfunction, would be an indication for cardiac catheterization, but at this  point, I do not believe it does. A repeat echocardiogram has been scheduled.  When that is available for review, then the question of whether heart  catheterization should be done on the basis of left ventricular dysfunction  can be revisited.   At this point, I suspect she has 30-40 pounds of edema to shed, and that  would be my first point of emphasis, as far as treatment is concerned.  I  think it is essential that she be protected with low dose insulin because  her potential for deep venous thrombosis and pulmonary embolism must be  extremely high.   As far as long-term treatment is concerned, she should probably be changed  over to insulin and as much as possible, kept off drugs which are prone to  cause edema; and of the ones that she is taking, the notable ones are  Norvasc and Actos.  An endocrinology consult may be of value in trying to  work-out the least damaging regimen for her; a pharmacology consult might  also be of benefit in that respect.   Finally, I have left an order to request that the discharge planners help  her with getting her CPAP set-up at home.  It may well be that in the long  run,  effective CPAP would be a better treatment for her edema than anything  else.   It goes without saying that effective treatment of her anemia is also an  essential part of all this; and perhaps, that is actually the best  treatment.   I again appreciate the opportunity to participate in the care of this nice  woman. I hope that these remarks will be helpful to you in managing her  care.                                                Jaclyn Prime. Lucas Mallow, M.D.    DDG/MEDQ  D:  05/07/2003  T:  05/08/2003  Job:  [id number]   cc:   Hettie Holstein, D.O.  Fax: [phone]   [name], M.D.  [address]  [place]  [place] [address]  Fax: [phone]

## 2011-01-12 NOTE — Consult Note (Signed)
   Kelly [name], Kelly [name]                         ACCOUNT NO.:  [id number]   MEDICAL RECORD NO.:  [id number]                   PATIENT TYPE:  INP   LOCATION:  3702                                 FACILITY:  [facility]   PHYSICIAN:  Jaclyn Prime. Lucas Mallow, M.D.                DATE OF BIRTH:  1945-11-08   DATE OF CONSULTATION:  05/07/2003  DATE OF DISCHARGE:                                   CONSULTATION   REQUESTING PHYSICIAN:  Hettie Holstein, D.O.   I appreciate the opportunity to participate in the care of the 65 year old  Kelly [name], a patient of Dr. Angelena Sole and Dr. Nicholos Johns, by  providing consultative services at Dr. Samantha Crimes request in regards to  evaluation and treatment for chest pain.   Kelly [name] has been morbidly obese for a significant period of time.  She  has had diabetes, sleep apnea, and a history of congestive heart failure.  She has also had a normocytic anemia for which she recently underwent upper  endoscopy, apparently with negative results.   She now gives a history of three to four weeks of progressively increasing  dyspnea upon exertion and at rest with notable swelling of her legs and  thighs.  She says that the last time she was at [facility] which was in the  summer she weighed 444 pounds.  She now weighs somewhere above 450 pounds.  For several days prior to this admission she had also had a feeling of  tightness in her chest.  There is some question as to whether some of that  may have been relieved by nitroglycerin at Dr. Carolyn Stare office at the  time of her admission.   PAST MEDICAL HISTORY:  Otherwise, includes arthroscopy of the right shoulder  for which she apparently had a cardiac consult in 2001 by Dr. Allyson Sabal for  clearance   Dictation ended at this point.                                               Jaclyn Prime. Lucas Mallow, M.D.    DDG/MEDQ  D:  05/07/2003  T:  05/08/2003  Job:  [id number]

## 2011-01-12 NOTE — H&P (Signed)
Kelly [name], Kelly [name]               ACCOUNT NO.:  [id number]   MEDICAL RECORD NO.:  [id number]          PATIENT TYPE:  INP   LOCATION:  2039                         FACILITY:  [facility]   PHYSICIAN:  Hettie Holstein, D.O.    DATE OF BIRTH:  09/17/1945   DATE OF ADMISSION:  05/10/2005  DATE OF DISCHARGE:                                HISTORY & PHYSICAL   PRIMARY CARE PHYSICIAN:  [name], M.D.   GASTROENTEROLOGIST:  Georgiana Spinner, M.D.   CARDIOLOGIST:  Jaclyn Prime. Lucas Mallow, M.D.   CHIEF COMPLAINT:  Chest pain.   HISTORY OF PRESENT ILLNESS:  Kelly [name] is a pleasant 65 year old morbidly  obese African-American female with previous history of diabetes mellitus and  hypertension and questionable cardiomyopathy who presents today with sudden  onset of chest pain that began while showering this morning around 10 or 11  o'clock that persisted for several minutes without radiation. It was  accompanied by dyspnea and not diaphoresis which was relieved with rest. She  denied nausea or vomiting. She presented to [facility] via private vehicle.   She was seen and her EKG was reviewed and without evidence of acute  ischemia. Point of care marker was negative at 1241. VQ scan was performed  due to a positive d-dimer of 1.31. Her BNP was within normal limits, less  than 30. She is currently pain-free and is being admitted to rule out acute  ischemic event.   She had been seeing Dr. Theresia Majors. Nichols at the [facility] and is  referred by Dr. [name] in regards to a right lower extremity  weeping wound most probably multifactorial in etiology and due to chronic  venous stasis and complications of morbid obesity. In any event, she was  under evaluation for temporo-arteritis in the outpatient setting including a  temporal artery biopsy, which I believe the results were negative and this  is according to the patient. In any event, she is tapering  off a dose of  prednisone. She reports she had been on prednisone for quite some time with  an initial dose of 40 mg.   PAST MEDICAL HISTORY:  1.  She does carry the diagnosis of morbid obesity.  2.  History of obstructive sleep apnea. She occasionally uses C-PAP at home.  3.  Diabetes mellitus currently on insulin.  4.  History of hyperlipidemia.  5.  History of internal hemorrhoids.  6.  History of hemoccult positive stools in the past with chronic anemia,      iron-deficiency anemia.  7.  She had been set up for capsule endoscopy in the outpatient setting per      Dr. Virginia Rochester; however, I do not have the results of these studies.   MEDICATIONS:  The patient uses [facility] in [place]. She  does not know all of her medications. She states she does take potassium 20  mEq daily; Bumex 2 mg three times a day; isosorbide mononitrate 30 mg daily;  Norvasc 10 mg daily; Lipitor 40 mg daily; Detrol (she does not know the  dose); Lopressor 50 mg b.i.d.; Lantus insulin 62 units q.h.s.; Humalog  sliding scale; aspirin 81 mg a day. In addition, she has been on a tapering  dose of prednisone.   ALLERGIES:  She reports an allergy to PENICILLIN.   SOCIAL HISTORY:  She lives at home with the help of her sister. She has been  married in the past. She is able to ambulate and performs all of her ADL's.   FAMILY HISTORY:  Her father died in his 76s from lung disease. Mother had a  history of heart disease. She has a sister with heart problems as well. The  patient states she quit smoking 16 years ago.   REVIEW OF SYSTEMS:  She has been in her usual state of health. She states  she has had some weeping, edema of her lower extremities. Otherwise, she has  had no additional complaints. She had been seeing her primary care physician  for possible temporo-arteritis but she states this is stable and not giving  her problems at this time.   PHYSICAL EXAMINATION:  GENERAL:  Kelly [name] was  resting comfortably in  bed, eating a value plate from [employer] and does not appear to  be in no acute distress.  HEENT:  Normocephalic and atraumatic. Extraocular movements intact.  VITAL SIGNS:  Reveal her to be hemodynamically stable with a blood pressure  134/58, temperature 98.3, heart rate 69, respirations 24. O2 saturation 97%.  NECK:  Supple and nontender. No palpable thyromegaly or mass.  CARDIOVASCULAR:  Normal S1 and S2 without S3 or S4.  LUNGS:  Clear, though study is limited due to body habitus. She exhibits  normal effort and there is no dullness to percussion.  ABDOMEN:  Soft and nontender. No palpable hepatosplenomegaly or mass. Bowel  sounds are normal active. Right lower extremity reveals chronic stasis  appearance, erythema and warmth to touch in her right inner thigh.  Peripheral pulses are symmetrical and palpable bilaterally. There are no  focal neurologic deficits.   LABORATORY DATA:  Urinalysis was negative. Point of care markers at 12:41  was negative. D-dimer was 1.31. Subsequent VQ scan was not suggestive of  pulmonary embolus, INR was 1.0. Sodium 139, potassium 5.8, BUN 111,  creatinine 2.1, glucose 186. AST/ALT 23/55. Albumin 2.9. WBC 9.3, hemoglobin  12.6, MCV of 78, BNP of less than 30.   ASSESSMENT:  1.  Chest pain, rule out myocardial infarction.  2.  Right lower extremity cellulitis.  3.  Acute renal insufficiency with previous baseline creatinine of 0.8.  4.  Diabetes mellitus.  5.  Obstructive sleep apnea/hypopnea.  6.  Hyperkalemia.  7.  Microcytic anemia.   PLAN:  At this time, we are going to admit [name] to a telemetry  floor, cycle cardiac markers, follow her renal function, and check a  urinalysis. I am not certain if she is on an ACE inhibitor or not as the  possible explanation of increasing creatinine. We will have to check with her pharmacy in the morning. Perhaps, request her primary care physician to  send a  medication list to [place].   Due to her chest pain and multiple risk factors for coronary artery disease,  it may be prudent to further risk stratify if she does rule out for an acute  ischemic event. Therefore, I have spoken with Dr. Domingo Sep, who is covering  for Dr.  Lucas Mallow, to follow up on his concern perhaps in the morning. She will be  placed  on aspirin, beta blocker, and nitrates if she is symptomatic. She  does look slightly hypovolemic with a small bump in her creatinine. We will  give her a small bolus of intravenous fluids and follow her renal function  in the morning.      Hettie Holstein, D.O.  Electronically Signed     ESS/MEDQ  D:  05/10/2005  T:  05/11/2005  Job:  [id number]   cc:   Jaclyn Prime. Lucas Mallow, M.D.  Fax: [phone]   [name], M.D.  Fax: [phone]

## 2011-01-12 NOTE — Op Note (Signed)
[facility]  Patient:    [name], [name]                      MRN: [id number] Proc. Date: 01/16/00 Adm. Date:  [id number] Disc. Date: [id number] Attending:  Drema Pry CC:         Lennon Alstrom. Felipa Eth, M.D.             Runell Gess, M.D.             Tera Mater. Evlyn Kanner, M.D.                           Operative Report  PREOPERATIVE DIAGNOSES: 1. Right shoulder impingement syndrome. 2. Acromioclavicular joint arthritis. 3. Rotator cuff avulsion. 4. Insulin-dependent diabetes. 5. Hypertension. 6. Morbid obesity.  POSTOPERATIVE DIAGNOSES: 1. Right shoulder impingement syndrome. 2. Acromioclavicular joint arthritis. 3. Rotator cuff avulsion. 4. Insulin-dependent diabetes. 5. Hypertension. 6. Morbid obesity.  OPERATION: 1. Right shoulder arthroscopy with subacromial arch decompression and    acromioplasty. 2. Distal clavicle resection. 3. Mini open rotator cuff repair with ultrafix anchor.  SURGEON:  Jearld Adjutant, M.D.  ASSISTANT:  Currie Paris. Thedore Mins.  ANESTHESIA:  General endotracheal.  CULTURES:  None.  DRAINS:  None.  ESTIMATED BLOOD LOSS: 75 cc.  REPLACEMENT: Without  PATHOLOGIC FINDINGS AND HISTORY:  Kelly [name] is a 65 year old female whose mother we have treated for many years, who presented on December 11, 1999 with a three month history of progressive right shoulder pain.  X-rays revealed a type III acromion.  Examination was consistent with [facility] joint arthritis and rotator cuff tear. She had a very large arm to lift with positive impingement signs.  We injected her subacromial space and AC joint with cortisone and Marcaine.  She came back in Jan 01, 2000. She got better after the last shot but the pain came back.  She did not notice much increase in her blood glucose with the shots. She did not want any more cortisone. At this point, due to her continued pain she desired to proceed with operative intervention. We got a  special frame for her for surgery and at surgery found no significant pathology in the glenohumeral joint.  There was a mild amount of glenohumeral degenerative wear on the glenoid side.  Some fibrillation along the glenoid labrum which was shaved at surgery.  She had a sharp anterior acromion, subdeltoid bursitis and obviously arthritic distal clavicle with a rotator cuff avulsion off the cuff that was repairable with satisfactory tissue.  We did a mini open procedure but in her was rather large and dissected down.  Repaired the rotator cuff with two ultrafix anchors to bone with #2 Ethibon sutures and did a repair of the central deltoid tendon back to the acromial remnant with #2 Ethibon sutures interrupted also through bone.  DESCRIPTION OF PROCEDURE:  With adequate anesthesia obtained and careful positioning the patient was placed in the supine beach chair position and the special holder, 300 mg of Cleocin was given IV prophylaxis and another dose given later intraoperatively.  After standard prepping and draping in the right shoulder anatomic skin markings were made for positioning, 20 cc 0.5% Marcaine with epinephrine was injected in the subacromial space to open it up. We then entered the shoulder through a posterior portal.  Anterior portal was established just lateral to the coracoid.  The shoulder was then thoroughly inspected and lightly  trimmed around the glenoid labrum and glenoid. Portal was reversed. Continue shaving carried out although mild at this point. We then entered the subacromial space through the posterior portal.  Anterior lateral portal was established and bursa and soft tissue off the anterior undersurface of the anterior acromion was shaved.  Acromioplasty was then carried out with a 6.0 bur to the roof of the subacromial space. We then turned the scope sideways and debrided the [facility] meniscus and brought in a shaver from the anterior portal and shaved the distal  clavicle two shaverbreadths in cauterizing bleeding points and using an ablator around the edge.  I then checked the distal clavicle resection from the lateral portal.  Tapered the acromion back to the bicortical bone in the manner of Gaspar. We then turned to scope downward and debrided with the shaver the bursitis and found the rotator cuff tear. At this point, we prepped with Betadine, made an incision laterally over the distal clavicle acromion down to the anterior lateral portal area and dissected down through the subcutaneous tissue which was extensive to the central deltoid tendon, incised it longitudinally, placed retractors and came down upon the rotator cuff tear. We freshened the edges. We made sure that our acromioplasty, distal clavicle resection was satisfactory with minor trimming continued with the shaver.  I then placed two ultrafix anchors with #2 Ethibon sutures and with interrupted horizontal mattress sutures brought the rotator cuff down to a freshened bed of bone on the tuberosity and sutured this down. We then closed the central deltoid tendon back with #2 Ethibon through bone on the anterior acromial remnant and oversewed with 1 Vicryl running stitch down the deltoid central tendon and the split we had made to get into the shoulder.  The wound was then closed with 2-0 Vicryl and 3-0 Vicryl and skin staples.  The other wounds were left open. A bulky sterile compressive dressing was applied, 0.5% Marcaine was injected in and about the portals.  The patient then having tolerated the procedure well was awakened and taken to the recovery room in satisfactory condition to be admitted for overnight observation and IV analgesia and oral pain medication. DD:  01/16/00 TD:  01/21/00 Job: [id number]

## 2011-01-12 NOTE — Discharge Summary (Signed)
NAMEELANOR, Kelly [name]                         ACCOUNT NO.:  [id number]   MEDICAL RECORD NO.:  [id number]                   PATIENT TYPE:  INP   LOCATION:  0343                                 FACILITY:  [facility]   PHYSICIAN:  [name], M.D.            DATE OF BIRTH:  [birth year]   DATE OF ADMISSION:  11/15/2002  DATE OF DISCHARGE:  11/17/2002                                 DISCHARGE SUMMARY   ADMISSION DIAGNOSES:  1. Chest pain, uncertain etiology.  2. Chronic issues including non-insulin-dependent diabetes mellitus, morbid     obesity, peripheral edema, hypertension, hyperlipidemia, history of     sarcoidosis.   DISCHARGE DIAGNOSES:  1. Chest pain secondary to gastritis/gastroesophageal reflux disease, ruled     out for myocardial infarction.  Patient unable to carry out CT scan to     rule out pulmonary embolus; however, clinical suspicion is low.  2. Chronic issues, stable.  3. Possible sleep apnea.  4. Anemia.   HOSPITAL COURSE:  After being admitted to [facility] the patient  was started on treatment using heparin.  The patient was started on heparin  therapy for suspected myocardial infarction.  She had cardiac enzymes x3  carried out.  This revealed no evidence of myocardial injury.  During her  stay here she was found to have developed some anemia.  As a result of no  significant of myocardial injury, heparin was discontinued.  The patient had  no complications.  She was started on proton pump inhibitor, and with this  her pain started to decrease.  She was thus stable and will be discharged  home.   During her hospital stay she did have an episode of bradycardia occurring  during sleep which resolved rapidly with awakening the patient.   RADIOLOGICAL INVESTIGATIONS:  Chest x-ray reveals cardiomegaly, mild  pulmonary vascular congestion, stable chronic interstitial disease, probable  bilateral hilar adenopathy.  Findings suggestive of sarcoidosis.   The  patient could not have various radiological testing because of her weight.  The patient's weight is estimated at 450 pounds per her bed scale.   LABORATORY INVESTIGATIONS:  At the time of discharge, CMP revealed sodium  139, potassium 4.3, BUN of 23, creatinine of 0.9.  Liver function tests were  grossly normal.  CBC revealed white count of 6.2, with a hemoglobin of 8.2,  hematocrit of 25.3.  ABGs taken on room air were found to show a pO2 of 65,  pCO2 of 40, bicarbonate 25.5, oxygen saturation 93.  Cardiac enzymes were  grossly negative x3.   DISCHARGE MEDICATIONS:  The patient is to continue all preadmission  medications.  In addition, the patient is to take Protonix 40 mg p.o. b.i.d.    FOLLOW-UP:  The patient will follow up with myself in approximately two  weeks' time at which time a sleep apnea study will be arranged.  [name], M.D.    AR/MEDQ  D:  11/17/2002  T:  11/17/2002  Job:  [id number]

## 2011-01-12 NOTE — Op Note (Signed)
   [name], Kelly [name]                         ACCOUNT NO.:  [id number]   MEDICAL RECORD NO.:  [id number]                   PATIENT TYPE:  AMB   LOCATION:  ENDO                                 FACILITY:  [facility]   PHYSICIAN:  Georgiana Spinner, M.D.                 DATE OF BIRTH:  1945/11/30   DATE OF PROCEDURE:  DATE OF DISCHARGE:                                 OPERATIVE REPORT   PROCEDURE:  Colonoscopy.   INDICATIONS FOR PROCEDURE:  Hemoccult positivity.   ANESTHESIA:  Demerol 20, Versed 3 mg.   DESCRIPTION OF PROCEDURE:  With the patient mildly sedated in the left  lateral decubitus position, the Olympus videoscopic colonoscope was inserted  in the rectum and passed under direct vision with pressure applied to the  abdomen. We reached the cecum identified by the ileocecal valve and  appendiceal orifice both of which were photographed. From this point, the  colonoscope was slowly withdrawn taking circumferential views of the entire  colonic mucosa stopping only then in the rectum which appeared normal on  direct and showed hemorrhoids on retroflexed view. The endoscope was  straightened and withdrawn. The patient's vital signs and pulse oximeter  remained stable. The patient tolerated the procedure well without apparent  complications.   FINDINGS:  Internal hemorrhoids otherwise an unremarkable colonoscopic  examination to the cecum.   PLAN:  Have the patient followup with me as needed.                                               Georgiana Spinner, M.D.    GMO/MEDQ  D:  04/16/2003  T:  04/16/2003  Job:  [id number]

## 2011-01-12 NOTE — Op Note (Signed)
Kelly [name], Kelly [name]               ACCOUNT NO.:  [id number]   MEDICAL RECORD NO.:  [id number]          PATIENT TYPE:  AMB   LOCATION:  DAY                          [facility]ACILITY[facility]   PHYSICIAN:  Thomas A. Cornett, M.D.DATE OF BIRTH:  1945-10-08   DATE OF PROCEDURE:  03/16/2005  DATE OF DISCHARGE:                                 OPERATIVE REPORT   PREOPERATIVE DIAGNOSIS:  Headaches, possible temporal arteritis.   POSTOPERATIVE DIAGNOSIS:  Headaches, possible temporal arteritis.   PROCEDURE:  Right temporal artery biopsy.   SURGEON:  Maisie Fus A. Cornett, M.D.   ANESTHESIA:  MAC with 15 cc of 1% Xylocaine plain.   ESTIMATED BLOOD LOSS:  10 cc.   SPECIMENS:  A 1 cm segment of right temporal artery to pathology for  evaluation.   INDICATIONS FOR PROCEDURE:  The patient is a 65 year old female who has had  recurrent headaches and concerns for temporal arteritis.  She is in need of  a temporal artery biopsy to determine her future treatment.  She is here  today to have that done.   DESCRIPTION OF PROCEDURE:  The patient is brought to the operating suite and  placed supine.  The right temporal region was prepped and draped in a  sterile fashion after initiation of MAC anesthesia.  An incision was made  two fingerbreadths above the right eyebrow over a palpable pulse, which felt  like probably the temporal artery.  An incision was made, and dissection was  carried down through the subcu fat.  A very small temporal artery was  identified, and this was dissected out for approximately 1 cm.  It was  pulsating but was quite small.  I could not find any other structure that  resembled a temporal artery, and figured this was secondary to manipulation  and me grasping it.  After dissecting out 1 cm, two small hemostats were  placed in the proximal distal ends of it.  It was then divided and examined.  It was a hollow vessel and appeared to be arterial in nature after observing  it pulsate as  well as looking at the open lumen.  Then 3-0 Vicryl ties were  placed on both ends to control bleeding for this.  Irrigation was not used.  Hemostasis was excellent.  There are no signs of any bleeding.  Monocryl 4-0  was then used to close the skin in a subcuticular fashion.  Steri-Strips  were applied.  All final counts were correct.  The patient was taken to the  recovery room in satisfactory condition.       TAC/MEDQ  D:  03/16/2005  T:  03/16/2005  Job:  [id number]

## 2011-01-12 NOTE — H&P (Signed)
Kelly [name], BUREN                         ACCOUNT NO.:  [id number]   MEDICAL RECORD NO.:  [id number]                   PATIENT TYPE:  INP   LOCATION:  5530                                 FACILITY:  [facility]   PHYSICIAN:  [name], M.D.            DATE OF BIRTH:  04-08-1946   DATE OF ADMISSION:  12/10/2002  DATE OF DISCHARGE:                                HISTORY & PHYSICAL   CHIEF COMPLAINT:  Increasing shortness of breath.   HISTORY OF PRESENT ILLNESS:  The patient has been having increasing  shortness of breath for approximately two days.  She has had a cough which  was present at that time, but increased markedly yesterday evening.  She had  tried over-the-counter antitussives, without effect.  With the progression  of the cough, she also noticed the onset of fever and chills.  With  increasing shortness of breath, she decided to come into the office for  further attention.   PAST MEDICAL HISTORY:  1. Morbid obesity.  2. Non-insulin-dependent diabetes mellitus.  3. 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  4. Palpitations.  5. Hypertension.  6. Hyperlipidemia.  7. Peripheral edema.  8. History of smoking.  9. History of right shoulder injury with arthritis.   PAST SURGICAL HISTORY:  Status post arthroscopies of the right knee and  right shoulder.   ALLERGIES:  PENICILLIN.   MEDICATIONS:  1. Glucovance 2.5/500 mg one tab p.o. daily.  2. Norvasc 10 mg p.o. daily.  3. Lotensin/HCTZ 20/25 mg p.o. daily.  4. Detrol LA q.h.s.  5. Actos 30 mg one tab p.o. daily.  6. Atenolol 650 mg one tab p.o. daily.  7. Aspirin 81 mg p.o. daily.   FAMILY HISTORY:  Father died in his 76s from lung disease.  Mother has a  history of a pacemaker insertion and hypertension.  One sister has heart  problems and a hiatal hernia.  Her brother has hypercholesterolemia.   SOCIAL HISTORY:  The patient presently does not smoke or use  alcohol.   REVIEW OF SYSTEMS:  All systems reviewed.  The pertinent positives are as  noted above.   PHYSICAL EXAMINATION:  VITAL SIGNS:  The patient is febrile with a  temperature of 99 degrees, blood pressure 136/78, pulse oximetry 89% on room  air, pulse rate 92.  HEENT:  Pupils equal, round, reactive to light and accommodation.  Extraocular movements are intact.  Mucosa are dry.  NECK:  Supple, without evidence of jugular venous distention or bruits.  HEART:  S1, S2 normal.  There is no rub, gallop, or murmur.  CHEST:  Auscultation is impaired because of the severe obesity; however,  there appear to be decreased breath sounds throughout.  The chest is  otherwise clear.  ABDOMEN:  Benign.  EXTREMITIES:  No pedal edema.  NEUROLOGIC:  The patient is fluent.  Has a  usual gait.   ASSESSMENT/PLAN:  1. Probable pneumonia/bronchitis:  Because of the patient's excessive     weight, it is improbable to carry out a chest x-ray at this point.  We     will initiate the patient on treatment with IV antibiotics and follow.  2. Hypoxia, most likely secondary to above:  Will treat with O2 and assess     with treatment.  3. Morbid obesity:  Follow.                                                  [name], M.D.    AR/MEDQ  D:  12/14/2002  T:  12/14/2002  Job:  [id number]

## 2011-01-12 NOTE — Discharge Summary (Signed)
   NAMEAVALENE, SEALY                         ACCOUNT NO.:  [id number]   MEDICAL RECORD NO.:  [id number]                   PATIENT TYPE:  INP   LOCATION:  3702                                 FACILITY:  [facility]   PHYSICIAN:  Hettie Holstein, D.O.                 DATE OF BIRTH:  12-20-1945   DATE OF ADMISSION:  05/06/2003  DATE OF DISCHARGE:  05/15/2003                                 DISCHARGE SUMMARY   ADDENDUM:  Job #[id number]   On May 17, 2003, additionally Mrs. Paget was notified today that she  should stop Actos as this possibly could be contributing to her lower  extremity edema.  She did address some concerns regarding her Glucophage and  Glucovance.  She has remaining Glucovance at home.  I recommended to her  that if she preferred she could continue her Glucovance, though it should be  noted that her Lantus dose was increased in the evening and that her blood  sugars may need to be followed closely as she could become hypoglycemic and  it is recommended that she check her blood sugars in the morning and she  review these with [name], M.D.  Some adjustments may need to be  made.  With regard to her other medication, Lipitor, she was discharged on  Zocor.  However, she states she has Lipitor at home.  I did notify her that  she could take her Lipitor and continue as such with [name],  M.D., to fill her prescriptions hereafter.   PROCEDURE:  (Please note in the procedures above, in the body of the  previous dictation, in addition, instead of status post 2 units of packed  red blood cell transfusion, put:)  Status post total of 5 units of packed  red blood cells this admission with recommendations for a followup CBC in a  week following discharge.                                                Hettie Holstein, D.O.    ESS/MEDQ  D:  05/17/2003  T:  05/18/2003  Job:  [id number]

## 2011-01-12 NOTE — Consult Note (Signed)
Kelly [name], Kelly [name]               ACCOUNT NO.:  [id number]   MEDICAL RECORD NO.:  [id number]          PATIENT TYPE:   LOCATION:                                 FACILITY:   PHYSICIAN:  Theresia Majors. Tanda Rockers, M.D.     DATE OF BIRTH:   DATE OF CONSULTATION:  05/01/2005  DATE OF DISCHARGE:                                   CONSULTATION   CARDIOVASCULAR THORACIC SURGERY CONSULTATION:   DATE OF CONSULTATION:  May 01, 2005   REASON FOR CONSULTATION:  This 65 year old female is referred by Dr.  Nicholos Johns for evaluation of bilateral lower extremity swelling and  ulceration.   IMPRESSION:  Bilateral lower extremity lymphedema with superficial  ulceration related to exogenous obesity.   RECOMMENDATION:  1.  Maximize efforts in the management of obesity and diabetes.  2.  With regard to her ulcerations, these ulcerations and lymphedematous      changes are secondary to #1 above.   SUBJECTIVE:  This 65 year old lady has been disabled for the last 4 years  and has been relatively sedentary. She does not have a routine of activity  and simply uses most of her daylight hours in a recumbent or sitting  position. Her difficulties are related to her back pain and previously  diagnosed degenerative arthritis. For these two maladies she has been  medically disabled. Concurrently she has experienced a 120-pound weight gain  over the past 3 years associated with increasing doses of her insulin. She  reports that her fasting blood sugars have remained within normal limits due  to increasing her dosages of insulin.   PAST MEDICAL HISTORY:  Her past medical history is discerned from the chart  and she has degenerative joint disease, she has diabetes mellitus,  hyperlipidemia and fluid retention.  She has also had episodes of sleep  apnea which are currently being evaluated.   CURRENT MEDICATIONS:  Her current medications include Lantus and Humalog,  Norvasc, Lipitor, isosorbide,  hydrochlorothiazide, metoprolol, __________,  prednisone, Niferex, occasional Aleve and Detrol.   ALLERGIES:  She is allergic to PENICILLIN which gives her hives.   FAMILY HISTORY:  Her family history is positive for diabetes, hypertension,  stroke and heart attack.   SOCIAL HISTORY:  She is single; she lives in [place]; she is disabled due  to multiple factors including her degenerative joint disease and exogenous  obesity.   REVIEW OF SYSTEMS:  Other review of systems is remarkable for the absence of  chest pain.  She has multiple myalgias which are not provoked by any  specific stimulus. Bowel and bladder function are unremarkable. She denies  heat or cold intolerance. She has not smoked, denies hemoptysis or  progressive shortness of breath, her weight gain has been discussed  previously.   PHYSICAL EXAM:  She is a pleasant oriented female in good contact with  reality. The HEENT exam is clear. The neck is supple. The heart sounds are  distant. Breast exam is deferred. The lungs are clear. The abdomen is  protuberant. Extremity exam is normal in the upper extremity and markedly  abnormal in the  lower extremity, there are large areas of medial thigh  obesity with peau d'orange composites over the medial thighs, the patellas  are near obscured by the adipose deposits. Photographs of these areas were  taken and were entered into the wound expert program. The pedal pulses are  palpable. There are chronic changes of stasis but there are no burrowing  ulcerations. The sensation is decreased but protective sensation is  retained. Neurologically the patient is grossly intact.   DISCUSSION:  This patient's lymphedematous changes are directly related to  the level of exogenous obesity. We have explained this in clear terms in  honest but yet compassionate demeanor. Kelly [name] seems to appreciate the  relationship between the progressive edema and the lack of control of her  intake.  She has expressed a desire to proceed with comprehensive program for  weight reduction and diabetic management. The patient seems not to be aware  of dietary programs for diabetics or at least she admits to not having  participated in one. Neither has she been instructed in proper nutrition as  it relates to her diabetes. We have suggested that she discuss these  recommendations with the primary care physician and recommit herself to a  program of decreased caloric intake and increase activity.   With regard to her wounds, I have expressed an interest and a willingness to  reevaluate her sooner if these ulcerations should break down and become a  problem. She should expect to continue with lymph seepage due to the mere  size of her extremities and the relative obstruction related to the fatty  deposits. She seems to understand and we will end followup consultation in 1  month.           ______________________________  Theresia Majors. Tanda Rockers, M.D.     Cephus Slater  D:  05/01/2005  T:  05/01/2005  Job:  [id number]   cc:   [name], M.D.  [address]  [place]  [place] [address]  Fax: [phone]

## 2011-01-12 NOTE — Discharge Summary (Signed)
   Kelly [name], Kelly [name]                         ACCOUNT NO.:  [id number]   MEDICAL RECORD NO.:  [id number]                   PATIENT TYPE:  INP   LOCATION:  5530                                 FACILITY:  [facility]   PHYSICIAN:  [name], M.D.            DATE OF BIRTH:  1946/06/06   DATE OF ADMISSION:  12/10/2002  DATE OF DISCHARGE:  12/14/2002                                 DISCHARGE SUMMARY   ADMISSION DIAGNOSES:  1. Pneumonia/bronchitis.  2. Hypoxia.  3. Morbid obesity.  4. Chronic conditions including: Non-insulin-dependent diabetes mellitus and     anemia; status post evaluation.   DISCHARGE DIAGNOSES:  1. Pneumonia/bronchitis responding to therapy.  2. Hypoxia, resolved.  3. Anemia requiring 1 unit of packed red blood cells, further evaluation as     an outpatient.  4. Morbid obesity continues.   HOSPITAL COURSE:  After being admitted to [facility] with  increasing shortness of breath, cough, and chills; [name] because of  her severe hypoxia was admitted and started on O2 as well as IV Tequin.  With this, her condition gradually improved.  With stabilization of her  condition she is presently ready for discharge.   Her anemia, which was present on admission, got slightly worse and required  1 unit of packed red blood cells prior to discharge.   LABORATORY INVESTIGATIONS AT DISCHARGE:  Serum sodium is 139; potassium 4.2;  chloride 107; BUN is 17 with a creatinine of 0.9; glucose of 143.  CBC  revealed white count of 5.3 with hemoglobin of 8.0, hematocrit of 24.6.  This is drawn prior to transfusion.  Platelet count was 283.  Stool for  occult blood was found to be negative.  Blood cultures that were carried out  were found to be negative.   DISCHARGE MEDICATIONS:  1. She will continue all preadmission medications.  2. Tequin 400 mg p.o. daily.  3. Niferex 1 tablet p.o. b.i.d.   DISCHARGE INSTRUCTIONS:  The patient will follow up with myself  in  approximately 1-2 weeks.                                               [name], M.D.    AR/MEDQ  D:  12/14/2002  T:  12/14/2002  Job:  [id number]

## 2011-01-12 NOTE — Discharge Summary (Signed)
Kelly [name], Kelly [name]               ACCOUNT NO.:  [id number]   MEDICAL RECORD NO.:  [id number]          PATIENT TYPE:  INP   LOCATION:  2039                         FACILITY:  [facility]   PHYSICIAN:  Danae Chen, M.D.DATE OF BIRTH:  [birth year]   DATE OF ADMISSION:  05/10/2005  DATE OF DISCHARGE:  05/13/2005                                 DISCHARGE SUMMARY   PRIMARY CARE PHYSICIAN:  [name], M.D.   DISCHARGE DIAGNOSES:  1.  Atypical chest pain with negative cardiac enzymes.  2.  Diabetes mellitus, adult onset, poorly controlled.  3.  Acute renal insufficiency, resolving.  4.  Morbid obesity.   DISCHARGE MEDICATIONS:  The patient is to resume her home medications at  prior dosages to include aspirin 81 mg p.o. daily, Protonix 40 mg p.o.  daily, Senokot-S p.o. q.h.s., metoprolol 30 mg p.o. b.i.d., Norvasc 10 mg  p.o. daily, Zocor 20 mg p.o. daily, Lantus decreased 75  units subcu  nightly, Detrol LA 4 mg p.o. daily.   FOLLOWUP:  In one week's time with Dr. Nicholos Johns.   CONSULTATIONS:  Cardiology, Dr. Aleen Campi. On interventions and no diagnostic  testing performed on this visit.   BRIEF HOSPITAL COURSE:  The patient is a morbidly obese, pleasant 65-year-  old [place] American female who was admitted with complaints of shortness of  breath and atypical chest pain. Cardiology consult was obtained and it was  thought that she had atypical chest pain that was probably not ischemic. She  does have a high risk profile, however so she was admitted and serial  cardiac enzymes were obtained. These were negative. The patient also  developed acute renal insufficiency most likely secondary to prerenal  syndrome. The patient was gently hydrated. At the time of discharge her  renal function was improving with BUN going from 111 to 55, and creatinine  going from 2.1 to 1.4. This should be rechecked, however, when she does  follow up with primary care physician. Her potassium on  discharge was 5,  sodium 140, and hemoglobin A1C that was checked on this visit was 8.5.  As  noted the patient's nighttime insulin dose was increased from 62 units to 75  units. No other changes were made in her medications. At the time of  discharge the patient is afebrile, with no complaints of chest pain. Blood  pressure is 128/61, saturating 94% on room air. Lungs are clear. Abdomen was  obese, but soft. Heart rate was regular with distant heart sounds and she  has extensive pannus formation of the lower extremities with slight weeping,  but no signs of infection or cellulitis.   CONDITION ON DISCHARGE:  Improved.   As noted to follow up with Dr. Nicholos Johns in one week's time.      Danae Chen, M.D.  Electronically Signed     RLK/MEDQ  D:  05/13/2005  T:  05/14/2005  Job:  [id number]   cc:   [name], M.D.  Fax: [phone]

## 2011-01-19 ENCOUNTER — Ambulatory Visit
Admission: RE | Admit: 2011-01-19 | Discharge: 2011-01-19 | Disposition: A | Discharge status: Home / Self Care | Payer: Medicare Other | Source: Ambulatory Visit | Attending: Geriatric Medicine | Admitting: Geriatric Medicine

## 2011-01-19 DIAGNOSIS — Z1231 Encounter for screening mammogram for malignant neoplasm of breast: Secondary | ICD-10-CM

## 2011-02-22 ENCOUNTER — Ambulatory Visit: Payer: Medicare Other | Admitting: Internal Medicine

## 2011-03-06 ENCOUNTER — Encounter: Payer: Self-pay | Admitting: Internal Medicine

## 2011-04-05 ENCOUNTER — Ambulatory Visit: Payer: Medicare Other | Admitting: Internal Medicine

## 2011-05-28 LAB — GLUCOSE, CAPILLARY
Glucose-Capillary: 100 — ABNORMAL HIGH
Glucose-Capillary: 112 — ABNORMAL HIGH
Glucose-Capillary: 119 — ABNORMAL HIGH
Glucose-Capillary: 127 — ABNORMAL HIGH
Glucose-Capillary: 138 — ABNORMAL HIGH
Glucose-Capillary: 153 — ABNORMAL HIGH
Glucose-Capillary: 64 — ABNORMAL LOW
Glucose-Capillary: 84
Glucose-Capillary: 86
Glucose-Capillary: 87
Glucose-Capillary: 91
Glucose-Capillary: 94
Glucose-Capillary: 95
Glucose-Capillary: 97
Glucose-Capillary: 60 — ABNORMAL LOW
Glucose-Capillary: 93
Glucose-Capillary: 97

## 2011-05-28 LAB — URINE MICROSCOPIC-ADD ON

## 2011-05-28 LAB — DIFFERENTIAL
Eosinophils Absolute: 0.2
Eosinophils Relative: 4
Eosinophils Relative: 5
Lymphocytes Relative: 16
Lymphs Abs: 1
Lymphs Abs: 1.1
Monocytes Absolute: 0.5
Monocytes Absolute: 0.6
Monocytes Relative: 8
Monocytes Relative: 8

## 2011-05-28 LAB — D-DIMER, QUANTITATIVE: D-Dimer, Quant: 1.99 — ABNORMAL HIGH

## 2011-05-28 LAB — BASIC METABOLIC PANEL
BUN: 29 — ABNORMAL HIGH
BUN: 32 — ABNORMAL HIGH
BUN: 38 — ABNORMAL HIGH
CO2: 24
CO2: 25
Chloride: 108
Chloride: 111
Chloride: 112
Creatinine, Ser: 1.36 — ABNORMAL HIGH
Creatinine, Ser: 1.38 — ABNORMAL HIGH
GFR calc Af Amer: 41 — ABNORMAL LOW
GFR calc non Af Amer: 34 — ABNORMAL LOW
Glucose, Bld: 116 — ABNORMAL HIGH
Glucose, Bld: 92
Potassium: 4.5
Potassium: 4.8
Potassium: 5.1
Sodium: 142

## 2011-05-28 LAB — CBC
HCT: 29.8 — ABNORMAL LOW
HCT: 29.8 — ABNORMAL LOW
HCT: 30.1 — ABNORMAL LOW
HCT: 33.7 — ABNORMAL LOW
HCT: 34.8 — ABNORMAL LOW
Hemoglobin: 11 — ABNORMAL LOW
Hemoglobin: 9.9 — ABNORMAL LOW
Hemoglobin: 9.9 — ABNORMAL LOW
Hemoglobin: 11.4 — ABNORMAL LOW
MCHC: 32.6
MCHC: 33
MCHC: 33
MCV: 82.2
MCV: 83
MCV: 83.6
Platelets: 189
Platelets: 202
RBC: 3.61 — ABNORMAL LOW
RBC: 4.06
RBC: 4.26
RDW: 14.9
RDW: 15.5
RDW: 16.3 — ABNORMAL HIGH
WBC: 4.9
WBC: 7.4
WBC: 7

## 2011-05-28 LAB — POCT I-STAT, CHEM 8
BUN: 30 — ABNORMAL HIGH
Calcium, Ion: 1.15
Chloride: 116 — ABNORMAL HIGH
Creatinine, Ser: 1.7 — ABNORMAL HIGH
Creatinine, Ser: 1.8 — ABNORMAL HIGH
Glucose, Bld: 121 — ABNORMAL HIGH
HCT: 34 — ABNORMAL LOW
Hemoglobin: 11.6 — ABNORMAL LOW
Potassium: 5.1
Sodium: 143
TCO2: 28

## 2011-05-28 LAB — URINALYSIS, ROUTINE W REFLEX MICROSCOPIC
Bilirubin Urine: NEGATIVE
Hgb urine dipstick: NEGATIVE
Ketones, ur: NEGATIVE
Nitrite: NEGATIVE
Nitrite: NEGATIVE
Specific Gravity, Urine: 1.017
Urobilinogen, UA: 0.2
pH: 5
pH: 6.5

## 2011-05-28 LAB — TROPONIN I: Troponin I: <0.01

## 2011-05-28 LAB — URINE CULTURE

## 2011-05-28 LAB — COMPREHENSIVE METABOLIC PANEL
ALT: 14
AST: 12
Albumin: 2.5 — ABNORMAL LOW
CO2: 24
Calcium: 8.6
GFR calc Af Amer: 43 — ABNORMAL LOW
GFR calc non Af Amer: 36 — ABNORMAL LOW
Sodium: 140

## 2011-05-28 LAB — CARDIAC PANEL(CRET KIN+CKTOT+MB+TROPI)
Relative Index: INVALID
Total CK: 85
Total CK: 95
Troponin I: 0.04

## 2011-05-28 LAB — CK TOTAL AND CKMB (NOT AT ARMC)
Relative Index: 0.9
Total CK: 105

## 2011-05-28 LAB — TSH: TSH: 1.303

## 2011-07-06 ENCOUNTER — Encounter (HOSPITAL_BASED_OUTPATIENT_CLINIC_OR_DEPARTMENT_OTHER): Payer: Medicare Other | Attending: General Surgery

## 2011-07-06 DIAGNOSIS — E119 Type 2 diabetes mellitus without complications: Secondary | ICD-10-CM | POA: Insufficient documentation

## 2011-07-06 DIAGNOSIS — L97109 Non-pressure chronic ulcer of unspecified thigh with unspecified severity: Secondary | ICD-10-CM | POA: Insufficient documentation

## 2011-07-06 DIAGNOSIS — E669 Obesity, unspecified: Secondary | ICD-10-CM | POA: Insufficient documentation

## 2011-07-06 DIAGNOSIS — Z7982 Long term (current) use of aspirin: Secondary | ICD-10-CM | POA: Insufficient documentation

## 2011-07-06 DIAGNOSIS — Z79899 Other long term (current) drug therapy: Secondary | ICD-10-CM | POA: Insufficient documentation

## 2011-07-06 DIAGNOSIS — I872 Venous insufficiency (chronic) (peripheral): Secondary | ICD-10-CM | POA: Insufficient documentation

## 2011-07-06 DIAGNOSIS — I10 Essential (primary) hypertension: Secondary | ICD-10-CM | POA: Insufficient documentation

## 2011-07-06 DIAGNOSIS — E785 Hyperlipidemia, unspecified: Secondary | ICD-10-CM | POA: Insufficient documentation

## 2011-07-06 DIAGNOSIS — I890 Lymphedema, not elsewhere classified: Secondary | ICD-10-CM | POA: Insufficient documentation

## 2011-07-09 NOTE — H&P (Signed)
NAMEBELLAROSE, BURTT NO.:  [id number]  MEDICAL RECORD NO.:  [id number]  LOCATION:                                 FACILITY:  PHYSICIAN:  Ardath Sax, M.D.          DATE OF BIRTH:  DATE OF ADMISSION:  07/06/2011 DATE OF DISCHARGE:                             HISTORY & PHYSICAL   Ms. Ashbaugh comes to [place] with a problem with superficial ulcerations, there are pressure sores on her posterior thighs.  This lady has many problems most notable being that she is morbidly obese probably somewhere in the 500-pound range.  She has diabetes, takes 55 of Lantus insulin a day, also has hypertension, and she is on atenolol.  She also has hyperlipidemia, temporal arteritis.  Her medicines include gabapentin for the temporal arteritis, isosorbide for the hypertension, Lantus insulin twice a day, metoprolol.  This lady is morbidly obese and on examination especially on the posterior right thigh there is about a 1 x 4 cm pressure sore and we scrubbed this up and put a collagen dressing on it with DuoDerm and we will have her come back in a week.  She also had a smaller one on the posterior left thigh, which we similarly treated.  So this is a lady with a pressure sores due to her obesity.  She is also diabetic and hypertensive, and we will treat her with collagen, and she will come back in a week.     Ardath Sax, M.D.     PP/MEDQ  D:  07/06/2011  T:  07/06/2011  Job:  [id number]

## 2011-07-25 NOTE — Progress Notes (Signed)
[facility]  NAME:  Kelly [name], Kelly [name]                 ACCOUNT NO.:  MEDICAL RECORD NO.:  [id number]       DATE OF BIRTH:  12/10/1945  PHYSICIAN:  Wayland Denis, DO       VISIT DATE:  07/25/2011                                  OFFICE VISIT   Kelly [name] is a 65 year old black female who is here for followup on her bilateral lower extremity lymphedema ulcers.  They are on the posterior upper thigh area.  The tissue is enormously large, hard, incredible lymphedema with oozing, and several superficial breaks in the skin.  The skin is thick and tissue is obviously chronically with edema. She has been using Silvercel dressings with some improvement.  She is sleeping on her backside most of the day, which is contributing to these wounds.  PAST MEDICAL HISTORY: 1. Diabetes. 2. Hypertension. 3. Hyperlipidemia. 4. Chronic venous insufficiency. 5. Obesity. 6. Temporal arteritis. 7. She has had shoulder surgery and laparoscopic knee surgery.  MEDICATIONS:  Aspirin, allopurinol, Colcrys, gabapentin, isosorbide, Lantus, Lasix, Lipitor, losartan, metoprolol, Dexilant, OxyContin, potassium, Phenergan, and Zolpidem.  ALLERGIES:  She is allergic to PENICILLIN.  REVIEW OF SYSTEMS:  Denies any significant change in her weight, mood, blood pressure, blood sugar, otherwise stable.  PHYSICAL EXAMINATION:  She is alert, oriented, cooperative.  She is very pleasant.  She seems to be a good historian.  Her pupils are equal. Extraocular muscles are intact.  No cervical lymphadenopathy.  Lungs are clear.  Breathing is unlabored.  Heart is regular.  Upper extremity pulse is strong and regular.  No cervical lymphadenopathy.  Her abdomen is large, but no rebound tenderness.  The thighs are as described above.  Recommend continuing with the Silvercel, DuoDerm.  I would like to try compression before any surgical intervention is attempted because of how much lymphedema she has.   Surgery is possible, but I would anticipate a wound healing issue.  All of this was explained to the patient.  Also talked to her about staying off the area and the importance of that.  We will see her back in a couple weeks.     Wayland Denis, DO     CS/MEDQ  D:  07/25/2011  T:  07/25/2011  Job:  [id number]

## 2011-08-10 ENCOUNTER — Encounter (HOSPITAL_BASED_OUTPATIENT_CLINIC_OR_DEPARTMENT_OTHER): Payer: Medicare Other

## 2011-08-15 ENCOUNTER — Encounter (HOSPITAL_BASED_OUTPATIENT_CLINIC_OR_DEPARTMENT_OTHER): Payer: Medicare Other | Attending: Plastic Surgery

## 2011-08-15 DIAGNOSIS — L89899 Pressure ulcer of other site, unspecified stage: Secondary | ICD-10-CM | POA: Insufficient documentation

## 2011-08-15 DIAGNOSIS — E785 Hyperlipidemia, unspecified: Secondary | ICD-10-CM | POA: Insufficient documentation

## 2011-08-15 DIAGNOSIS — L8992 Pressure ulcer of unspecified site, stage 2: Secondary | ICD-10-CM | POA: Insufficient documentation

## 2011-08-15 DIAGNOSIS — Z79899 Other long term (current) drug therapy: Secondary | ICD-10-CM | POA: Insufficient documentation

## 2011-08-15 DIAGNOSIS — I890 Lymphedema, not elsewhere classified: Secondary | ICD-10-CM | POA: Insufficient documentation

## 2011-08-15 DIAGNOSIS — I10 Essential (primary) hypertension: Secondary | ICD-10-CM | POA: Insufficient documentation

## 2011-08-15 DIAGNOSIS — E119 Type 2 diabetes mellitus without complications: Secondary | ICD-10-CM | POA: Insufficient documentation

## 2011-08-15 DIAGNOSIS — Z7982 Long term (current) use of aspirin: Secondary | ICD-10-CM | POA: Insufficient documentation

## 2011-09-13 ENCOUNTER — Telehealth: Payer: Self-pay | Admitting: Emergency Medicine

## 2011-09-13 DIAGNOSIS — I272 Pulmonary hypertension, unspecified: Secondary | ICD-10-CM

## 2011-09-13 NOTE — Telephone Encounter (Signed)
Spoke with pt. She states that she has not been able to afford o2 and therefore wants [facility] to pick up. She states that she has not been followed by any other doc regarding her breathing and o2 needs since last in the hospital in 2010 RB started pt on o2 at her June 2010 hospital admit- I confirmed this with [facility] and they are supposed to be faxing me the original order. Please advise, thanks!

## 2011-09-17 NOTE — Telephone Encounter (Signed)
Order received and placed in RB look at for review.

## 2011-09-19 ENCOUNTER — Encounter (HOSPITAL_BASED_OUTPATIENT_CLINIC_OR_DEPARTMENT_OTHER): Payer: Medicare Other | Attending: Plastic Surgery

## 2011-09-19 DIAGNOSIS — I890 Lymphedema, not elsewhere classified: Secondary | ICD-10-CM | POA: Insufficient documentation

## 2011-09-20 ENCOUNTER — Other Ambulatory Visit: Payer: Self-pay

## 2011-09-20 ENCOUNTER — Encounter (HOSPITAL_COMMUNITY): Payer: Self-pay

## 2011-09-20 ENCOUNTER — Emergency Department (HOSPITAL_COMMUNITY): Payer: Medicare Other

## 2011-09-20 ENCOUNTER — Inpatient Hospital Stay (HOSPITAL_COMMUNITY)
Admission: EM | Admit: 2011-09-20 | Discharge: 2011-09-23 | DRG: 204 | Disposition: A | Discharge status: Home Health | Payer: Medicare Other | Source: Ambulatory Visit | Attending: Internal Medicine | Admitting: Internal Medicine

## 2011-09-20 DIAGNOSIS — M109 Gout, unspecified: Secondary | ICD-10-CM | POA: Diagnosis present

## 2011-09-20 DIAGNOSIS — R079 Chest pain, unspecified: Secondary | ICD-10-CM | POA: Clinically undetermined

## 2011-09-20 DIAGNOSIS — E1169 Type 2 diabetes mellitus with other specified complication: Secondary | ICD-10-CM | POA: Diagnosis present

## 2011-09-20 DIAGNOSIS — Z79899 Other long term (current) drug therapy: Secondary | ICD-10-CM

## 2011-09-20 DIAGNOSIS — I890 Lymphedema, not elsewhere classified: Secondary | ICD-10-CM | POA: Diagnosis present

## 2011-09-20 DIAGNOSIS — E785 Hyperlipidemia, unspecified: Secondary | ICD-10-CM

## 2011-09-20 DIAGNOSIS — I509 Heart failure, unspecified: Secondary | ICD-10-CM

## 2011-09-20 DIAGNOSIS — E875 Hyperkalemia: Secondary | ICD-10-CM | POA: Diagnosis present

## 2011-09-20 DIAGNOSIS — I5033 Acute on chronic diastolic (congestive) heart failure: Secondary | ICD-10-CM | POA: Diagnosis present

## 2011-09-20 DIAGNOSIS — I2789 Other specified pulmonary heart diseases: Secondary | ICD-10-CM

## 2011-09-20 DIAGNOSIS — L8990 Pressure ulcer of unspecified site, unspecified stage: Secondary | ICD-10-CM | POA: Diagnosis present

## 2011-09-20 DIAGNOSIS — I10 Essential (primary) hypertension: Secondary | ICD-10-CM

## 2011-09-20 DIAGNOSIS — Z794 Long term (current) use of insulin: Secondary | ICD-10-CM

## 2011-09-20 DIAGNOSIS — E1149 Type 2 diabetes mellitus with other diabetic neurological complication: Secondary | ICD-10-CM | POA: Diagnosis present

## 2011-09-20 DIAGNOSIS — Z7982 Long term (current) use of aspirin: Secondary | ICD-10-CM

## 2011-09-20 DIAGNOSIS — R601 Generalized edema: Secondary | ICD-10-CM

## 2011-09-20 DIAGNOSIS — N183 Chronic kidney disease, stage 3 unspecified: Secondary | ICD-10-CM | POA: Diagnosis present

## 2011-09-20 DIAGNOSIS — K5900 Constipation, unspecified: Secondary | ICD-10-CM | POA: Diagnosis present

## 2011-09-20 DIAGNOSIS — G4733 Obstructive sleep apnea (adult) (pediatric): Secondary | ICD-10-CM | POA: Diagnosis present

## 2011-09-20 DIAGNOSIS — I129 Hypertensive chronic kidney disease with stage 1 through stage 4 chronic kidney disease, or unspecified chronic kidney disease: Secondary | ICD-10-CM | POA: Diagnosis present

## 2011-09-20 DIAGNOSIS — E662 Morbid (severe) obesity with alveolar hypoventilation: Secondary | ICD-10-CM

## 2011-09-20 DIAGNOSIS — E119 Type 2 diabetes mellitus without complications: Secondary | ICD-10-CM

## 2011-09-20 DIAGNOSIS — R0602 Shortness of breath: Principal | ICD-10-CM | POA: Diagnosis present

## 2011-09-20 DIAGNOSIS — L98499 Non-pressure chronic ulcer of skin of other sites with unspecified severity: Secondary | ICD-10-CM | POA: Diagnosis present

## 2011-09-20 DIAGNOSIS — G4730 Sleep apnea, unspecified: Secondary | ICD-10-CM

## 2011-09-20 DIAGNOSIS — I872 Venous insufficiency (chronic) (peripheral): Secondary | ICD-10-CM | POA: Diagnosis present

## 2011-09-20 DIAGNOSIS — L89899 Pressure ulcer of other site, unspecified stage: Secondary | ICD-10-CM | POA: Diagnosis present

## 2011-09-20 HISTORY — DX: Heart failure, unspecified: I50.9

## 2011-09-20 LAB — BASIC METABOLIC PANEL
Calcium: 9.3 mg/dL (ref 8.4–10.5)
Creatinine, Ser: 1.47 mg/dL — ABNORMAL HIGH (ref 0.50–1.10)
GFR calc non Af Amer: 37 mL/min — ABNORMAL LOW (ref >90)
Glucose, Bld: 80 mg/dL (ref 70–99)
Sodium: 140 mEq/L (ref 135–145)

## 2011-09-20 LAB — CBC
MCH: 26.2 pg (ref 26.0–34.0)
MCV: 84.6 fL (ref 78.0–100.0)
MCV: 84.3 fL (ref 78.0–100.0)
Platelets: 291 10*3/uL (ref 150–400)
Platelets: 233 10*3/uL (ref 150–400)
RDW: 16.5 % — ABNORMAL HIGH (ref 11.5–15.5)
RDW: 16.6 % — ABNORMAL HIGH (ref 11.5–15.5)
WBC: 5.4 10*3/uL (ref 4.0–10.5)

## 2011-09-20 LAB — URINALYSIS, ROUTINE W REFLEX MICROSCOPIC
Bilirubin Urine: NEGATIVE
Glucose, UA: NEGATIVE mg/dL
Hgb urine dipstick: NEGATIVE
Specific Gravity, Urine: 1.009 (ref 1.005–1.030)
Urobilinogen, UA: 0.2 mg/dL (ref 0.0–1.0)

## 2011-09-20 LAB — DIFFERENTIAL
Basophils Absolute: 0 10*3/uL (ref 0.0–0.1)
Eosinophils Absolute: 0.5 10*3/uL (ref 0.0–0.7)
Eosinophils Relative: 7 % — ABNORMAL HIGH (ref 0–5)
Lymphs Abs: 1.2 10*3/uL (ref 0.7–4.0)
Monocytes Absolute: 0.4 10*3/uL (ref 0.1–1.0)

## 2011-09-20 LAB — CARDIAC PANEL(CRET KIN+CKTOT+MB+TROPI): Troponin I: <0.3 ng/mL (ref <0.30)

## 2011-09-20 LAB — CREATININE, SERUM
GFR calc Af Amer: 53 mL/min — ABNORMAL LOW (ref >90)
GFR calc non Af Amer: 46 mL/min — ABNORMAL LOW (ref >90)

## 2011-09-20 LAB — TROPONIN I: Troponin I: <0.3 ng/mL (ref <0.30)

## 2011-09-20 MED ORDER — OXYCODONE-ACETAMINOPHEN 5-325 MG PO TABS
1.0000 | ORAL_TABLET | Freq: Once | ORAL | Status: AC
  Administered 2011-09-20: 1 via ORAL
  Filled 2011-09-20: qty 1

## 2011-09-20 MED ORDER — PANTOPRAZOLE SODIUM 40 MG PO TBEC: 40.0000 mg | DELAYED_RELEASE_TABLET | Freq: Every day | ORAL | Status: DC

## 2011-09-20 MED ORDER — ONDANSETRON HCL 4 MG/2ML IJ SOLN: 4.0000 mg | Freq: Four times a day (QID) | INTRAMUSCULAR | Status: DC | PRN

## 2011-09-20 MED ORDER — ONDANSETRON HCL 4 MG PO TABS: 4.0000 mg | ORAL_TABLET | Freq: Four times a day (QID) | ORAL | Status: DC | PRN

## 2011-09-20 MED ORDER — ASPIRIN 81 MG PO TABS: 81.0000 mg | ORAL_TABLET | Freq: Every day | ORAL | Status: DC

## 2011-09-20 MED ORDER — METOPROLOL TARTRATE 12.5 MG HALF TABLET
12.5000 mg | ORAL_TABLET | Freq: Two times a day (BID) | ORAL | Status: DC
  Administered 2011-09-20 – 2011-09-21 (×2): 12.5 mg via ORAL
  Filled 2011-09-20 (×3): qty 1

## 2011-09-20 MED ORDER — INSULIN ASPART 100 UNIT/ML ~~LOC~~ SOLN
0.0000 [IU] | Freq: Three times a day (TID) | SUBCUTANEOUS | Status: DC
  Administered 2011-09-21 – 2011-09-22 (×3): 2 [IU] via SUBCUTANEOUS
  Filled 2011-09-20: qty 3

## 2011-09-20 MED ORDER — ROSUVASTATIN CALCIUM 20 MG PO TABS
20.0000 mg | ORAL_TABLET | Freq: Every day | ORAL | Status: DC
  Administered 2011-09-20 – 2011-09-23 (×4): 20 mg via ORAL
  Filled 2011-09-20 (×4): qty 1

## 2011-09-20 MED ORDER — PROMETHAZINE HCL 25 MG PO TABS: 25.0000 mg | ORAL_TABLET | Freq: Two times a day (BID) | ORAL | Status: DC | PRN

## 2011-09-20 MED ORDER — OXYCODONE-ACETAMINOPHEN 5-325 MG PO TABS
3.0000 | ORAL_TABLET | Freq: Four times a day (QID) | ORAL | Status: DC | PRN
  Administered 2011-09-20 – 2011-09-23 (×5): 3 via ORAL
  Filled 2011-09-20 (×5): qty 3

## 2011-09-20 MED ORDER — ISOSORBIDE MONONITRATE 10 MG PO TABS
10.0000 mg | ORAL_TABLET | Freq: Two times a day (BID) | ORAL | Status: DC
  Administered 2011-09-20 – 2011-09-22 (×5): 10 mg via ORAL
  Administered 2011-09-23: 10:00:00 via ORAL
  Filled 2011-09-20 (×7): qty 1

## 2011-09-20 MED ORDER — INSULIN ASPART 100 UNIT/ML ~~LOC~~ SOLN: 0.0000 [IU] | Freq: Every day | SUBCUTANEOUS | Status: DC

## 2011-09-20 MED ORDER — SODIUM CHLORIDE 0.9 % IJ SOLN: 3.0000 mL | INTRAMUSCULAR | Status: DC | PRN

## 2011-09-20 MED ORDER — PANTOPRAZOLE SODIUM 40 MG PO TBEC
40.0000 mg | DELAYED_RELEASE_TABLET | Freq: Every day | ORAL | Status: DC
  Administered 2011-09-21 – 2011-09-23 (×3): 40 mg via ORAL
  Filled 2011-09-20 (×3): qty 1

## 2011-09-20 MED ORDER — SENNOSIDES-DOCUSATE SODIUM 8.6-50 MG PO TABS
1.0000 | ORAL_TABLET | Freq: Every evening | ORAL | Status: DC | PRN
  Filled 2011-09-20: qty 1

## 2011-09-20 MED ORDER — SODIUM CHLORIDE 0.9 % IV SOLN
INTRAVENOUS | Status: DC
  Administered 2011-09-20: 12:00:00 via INTRAVENOUS

## 2011-09-20 MED ORDER — SODIUM CHLORIDE 0.9 % IV SOLN: 250.0000 mL | INTRAVENOUS | Status: DC | PRN

## 2011-09-20 MED ORDER — OXYCODONE-ACETAMINOPHEN 7.5-325 MG PO TABS: 2.0000 | ORAL_TABLET | Freq: Four times a day (QID) | ORAL | Status: DC | PRN

## 2011-09-20 MED ORDER — COLCHICINE 0.6 MG PO TABS
0.6000 mg | ORAL_TABLET | Freq: Two times a day (BID) | ORAL | Status: DC
  Administered 2011-09-20 – 2011-09-23 (×6): 0.6 mg via ORAL
  Filled 2011-09-20 (×7): qty 1

## 2011-09-20 MED ORDER — SODIUM CHLORIDE 0.9 % IJ SOLN
3.0000 mL | Freq: Two times a day (BID) | INTRAMUSCULAR | Status: DC
  Administered 2011-09-20 – 2011-09-23 (×5): 3 mL via INTRAVENOUS

## 2011-09-20 MED ORDER — ALLOPURINOL 100 MG PO TABS
200.0000 mg | ORAL_TABLET | Freq: Every day | ORAL | Status: DC
  Administered 2011-09-21 – 2011-09-23 (×3): 200 mg via ORAL
  Filled 2011-09-20 (×3): qty 2

## 2011-09-20 MED ORDER — FUROSEMIDE 10 MG/ML IJ SOLN
80.0000 mg | Freq: Two times a day (BID) | INTRAMUSCULAR | Status: DC
  Filled 2011-09-20 (×3): qty 8

## 2011-09-20 MED ORDER — GABAPENTIN 300 MG PO CAPS
300.0000 mg | ORAL_CAPSULE | Freq: Three times a day (TID) | ORAL | Status: DC
  Administered 2011-09-20 – 2011-09-23 (×8): 300 mg via ORAL
  Filled 2011-09-20 (×11): qty 1

## 2011-09-20 MED ORDER — ASPIRIN EC 81 MG PO TBEC
81.0000 mg | DELAYED_RELEASE_TABLET | Freq: Every day | ORAL | Status: DC
  Filled 2011-09-20: qty 1

## 2011-09-20 MED ORDER — ENOXAPARIN SODIUM 40 MG/0.4ML ~~LOC~~ SOLN
40.0000 mg | SUBCUTANEOUS | Status: DC
  Administered 2011-09-20: 40 mg via SUBCUTANEOUS
  Filled 2011-09-20 (×2): qty 0.4

## 2011-09-20 MED ORDER — ASPIRIN EC 81 MG PO TBEC
81.0000 mg | DELAYED_RELEASE_TABLET | Freq: Every day | ORAL | Status: DC
  Administered 2011-09-21 – 2011-09-23 (×3): 81 mg via ORAL
  Filled 2011-09-20 (×3): qty 1

## 2011-09-20 NOTE — Progress Notes (Signed)
[facility]  NAME:  Kelly [name]               ACCOUNT NO.:  [id number]  MEDICAL RECORD NO.:  [id number]      DATE OF BIRTH:  10/31/1945  PHYSICIAN:  Wayland Denis, DO       VISIT DATE:  09/19/2011                                  OFFICE VISIT   Ms. Kelly [name] is a 66 year old black female, who is here for followup on her bilateral lower extremity lymphedema.  It has actually gotten a little bit worse.  She has had quite a bit of overall swelling, which of course has manifested itself at the lower extremity area.  She also notices that it breaks open when she has to climb out of her bed and it scrapes across the bed.  So she is interested in looking at alternative for improving the bed situation which I think is a good idea.  There has been no other change in her medications.  REVIEW OF SYSTEMS:  Negative.  SOCIAL HISTORY:  Unchanged.  PHYSICAL EXAMINATION:  GENERAL:  She is alert, oriented, cooperative, not in any acute distress but a little upset about not improving and has tried to get in touch with the primary care physician, but had some trouble with that. HEENT:  Her pupils are equal.  Extraocular muscles are intact. NECK:  No cervical lymphadenopathy. LUNGS:  Her breathing is nonlabored but says that she can feel a little bit of difference in her lungs. CARDIAC:  Her heart is regular rate and rhythm.  The wounds are slightly worsened last time with quite a bit of fluid in the leg area.  I do not really see a bunch of fluid in the overall body area, but we are going to use on an alginate and checkup prealbumin and have her come back for a followup.  Thank you.     Wayland Denis, DO     CS/MEDQ  D:  09/19/2011  T:  09/20/2011  Job:  [id number]

## 2011-09-20 NOTE — ED Notes (Signed)
Dr. Cleotis Lema in for admission evaluation

## 2011-09-20 NOTE — Telephone Encounter (Signed)
OK with me to d/c the oxygen.

## 2011-09-20 NOTE — Telephone Encounter (Signed)
LMOMTCB x 1 

## 2011-09-20 NOTE — ED Notes (Signed)
Report given to [facility] for Rm 4728. Patient is awaiting room ready status.  Floor will call when ready. Patient informed of status.

## 2011-09-20 NOTE — ED Notes (Signed)
4728-01 Ready 

## 2011-09-20 NOTE — ED Notes (Signed)
Bed ready will transport to floor

## 2011-09-20 NOTE — H&P (Addendum)
PCP:  Alva Garnet., MD, MD   DOA:  09/20/2011 10:37 AM  Chief Complaint:  Shortness of breath and worsening lower extremity swelling.  HPI: Kelly [name] is a 66 years old African American woman with multiple comorbidities presented to the ER today with worsening shortness of breath and increasing weight and lower extremity edema with weeping of fluid from her legs for the last 2-3 days. She she stated that she gained about 20 pounds in one month. She denies any chest pain when I ask her however at the ER documentation patient was complaining of dull chest pain. She denies any cough, fever or chills. She also mentioned that she's is constipated and noticed some blood after she had bowel movement yesterday.Patient was seen at the wound care clinic yesterday and she was advised to see how Dr. Renae Gloss today however because of unavailability of appointment she was sent to the ER. Allergies: Allergies  Allergen Reactions  . Penicillins Hives    Prior to Admission medications   Medication Sig Start Date End Date Taking? Authorizing Provider  allopurinol (ZYLOPRIM) 100 MG tablet Take 200 mg by mouth daily.   Yes Historical Provider, MD  aspirin 81 MG tablet Take 81 mg by mouth daily.   Yes Historical Provider, MD  atorvastatin (LIPITOR) 40 MG tablet Take 40 mg by mouth at bedtime.   Yes Historical Provider, MD  colchicine 0.6 MG tablet Take 0.6 mg by mouth 2 (two) times daily.   Yes Historical Provider, MD  dexlansoprazole (DEXILANT) 60 MG capsule Take 60 mg by mouth daily.   Yes Historical Provider, MD  furosemide (LASIX) 80 MG tablet Take 80 mg by mouth daily.   Yes Historical Provider, MD  gabapentin (NEURONTIN) 300 MG capsule Take 300 mg by mouth 3 (three) times daily.   Yes Historical Provider, MD  insulin glargine (LANTUS) 100 UNIT/ML injection Inject 55 Units into the skin at bedtime. As directed   Yes Historical Provider, MD  isosorbide mononitrate (ISMO,MONOKET) 10 MG tablet Take 10  mg by mouth 2 (two) times daily.   Yes Historical Provider, MD  losartan (COZAAR) 50 MG tablet Take 25-50 mg by mouth 2 (two) times daily. Patient takes 1 tablet in the morning and 1/2 tablet at night   Yes Historical Provider, MD  metoprolol tartrate (LOPRESSOR) 25 MG tablet Take 12.5 mg by mouth 2 (two) times daily.   Yes Historical Provider, MD  oxyCODONE-acetaminophen (PERCOCET) 7.5-325 MG per tablet Take 2 tablets by mouth every 6 (six) hours as needed. For pain   Yes Historical Provider, MD  potassium chloride SA (K-DUR,KLOR-CON) 20 MEQ tablet Take 20 mEq by mouth daily.   Yes Historical Provider, MD   promethazine (PHENERGAN) 25 MG tablet Take 25 mg by mouth 2 (two) times daily as needed. For nausea   Yes Historical Provider, MD    Past Medical History  Diagnosis Date  . Hypertension Chronic bilateral lower extremity lymphedema/wounds Morbid obesity Severe pulmonary hypertension Obesity hypoventilation syndrome CKD STAGE III/baseline creatinine 1.4-2    . Diabetes mellitus   . Hyperlipidemia   . Sleep apnea   . CHF (congestive heart failure)     Past Surgical History  Procedure Date  . Rotator cuff repair   . Knee arthroscopy   . Temple biopsy     Social History: Lives alone, has a wound care nurse who comes twice a week , use a walker to ambulate. Denies smoking, alcohol drinking or illicit drug use.   Family History  Problem Relation Age of Onset  . Emphysema Father   . Asthma Brother   . Heart disease Mother     grandmother  . Stomach cancer Brother     Review of Systems:  Constitutional: Denies fever, chills, diaphoresis, appetite change and fatigue.  HEENT: Denies photophobia, eye pain, redness, hearing loss, ear pain, congestion, sore throat, rhinorrhea, sneezing, mouth sores, trouble swallowing, neck pain, neck stiffness and tinnitus.   Respiratory: C/O  SOB, DOE, cough, chest tightness,   denies  wheezing.   Cardiovascular: Denies chest pain, palpitations  .complaining of  leg swelling.  Gastrointestinal: Denies nausea, vomiting, abdominal pain, diarrhea,  C/Of constipation and  mild blood in stool  Genitourinary: Denies dysuria, urgency, frequency, hematuria, flank pain and difficulty urinating.   Skin: C/O wounds,weeping of  skin.  Neurological: Denies dizziness, seizures, syncope, weakness, light-headedness, numbness and headaches.     Physical Exam:  Filed Vitals:   09/20/11 1040 09/20/11 1225 09/20/11 1416  BP: 171/71 139/55 145/56  Pulse: 86 79 71  Temp: 97.9 F (36.6 C)    TempSrc: Oral    Resp: 32  20  Height:  5\' 6"  (1.676 m)   Weight:  176.903 kg (390 lb)   SpO2: 98% 99% 100%    Constitutional: Vital signs reviewed.  Patient is in no acute distress and cooperative with exam. Alert and oriented x3.  Eyes: PERRL, EOMI, conjunctivae normal, No scleral icterus.  Neck: Supple, Trachea midline normal ROM, No JVD, mass, thyromegaly, or carotid bruit present.  Cardiovascular: RRR, S1 normal, S2 normal, no MRG, pulses symmetric and intact bilaterally Pulmonary/Chest: Decreased breathing sounds bilaterally, no wheezes, rales, or rhonchi Abdominal:  Obese ,Soft. Non-tender, non-distended, bowel sounds are normal, no masses, organomegaly, or guarding present.  Ext: Bilateral lymphedema with chronic indurated  skin changes and  weeping edema anterio laterally, no warmth or erythema noted.  Neurological: A&O x3, no focal deficits appreciated.   Labs on Admission:  Results for orders placed during the hospital encounter of 09/20/11 (from the past 48 hour(s))  CBC     Status: Abnormal   Collection Time   09/20/11 11:52 AM      Component Value Range Comment   WBC 6.8  4.0 - 10.5 (K/uL)    RBC 4.08  3.87 - 5.11 (MIL/uL)    Hemoglobin 10.7 (*) 12.0 - 15.0 (g/dL)    HCT 62.1 (*) 30.8 - 46.0 (%)    MCV 84.6  78.0 - 100.0 (fL)    MCH 26.2  26.0 - 34.0 (pg)    MCHC 31.0  30.0 - 36.0 (g/dL)    RDW 65.7 (*) 84.6 - 15.5 (%)    Platelets  291  150 - 400 (K/uL)   DIFFERENTIAL     Status: Abnormal   Collection Time   09/20/11 11:52 AM      Component Value Range Comment   Neutrophils Relative 69  43 - 77 (%)    Neutro Abs 4.7  1.7 - 7.7 (K/uL)    Lymphocytes Relative 18  12 - 46 (%)    Lymphs Abs 1.2  0.7 - 4.0 (K/uL)    Monocytes Relative 6  3 - 12 (%)    Monocytes Absolute 0.4  0.1 - 1.0 (K/uL)    Eosinophils Relative 7 (*) 0 - 5 (%)    Eosinophils Absolute 0.5  0.0 - 0.7 (K/uL)    Basophils Relative 0  0 - 1 (%)    Basophils Absolute 0.0  0.0 -  0.1 (K/uL)   BASIC METABOLIC PANEL     Status: Abnormal   Collection Time   09/20/11 11:52 AM      Component Value Range Comment   Sodium 140  135 - 145 (mEq/L)    Potassium 5.4 (*) 3.5 - 5.1 (mEq/L)    Chloride 108  96 - 112 (mEq/L)    CO2 20  19 - 32 (mEq/L)    Glucose, Bld 80  70 - 99 (mg/dL)    BUN 29 (*) 6 - 23 (mg/dL)    Creatinine, Ser 1.61 (*) 0.50 - 1.10 (mg/dL)    Calcium 9.3  8.4 - 10.5 (mg/dL)    GFR calc non Af Amer 37 (*) >90 (mL/min)    GFR calc Af Amer 42 (*) >90 (mL/min)   PRO B NATRIURETIC PEPTIDE     Status: Abnormal   Collection Time   09/20/11 11:52 AM      Component Value Range Comment   Pro B Natriuretic peptide (BNP) 606.2 (*) 0 - 125 (pg/mL)   TROPONIN I     Status: Normal   Collection Time   09/20/11 11:52 AM      Component Value Range Comment   Troponin I <0.30  <0.30 (ng/mL)   GLUCOSE, CAPILLARY     Status: Abnormal   Collection Time   09/20/11 11:54 AM      Component Value Range Comment   Glucose-Capillary 69 (*) 70 - 99 (mg/dL)   URINALYSIS, ROUTINE W REFLEX MICROSCOPIC     Status: Abnormal   Collection Time   09/20/11  1:23 PM      Component Value Range Comment   Color, Urine YELLOW  YELLOW     APPearance HAZY (*) CLEAR     Specific Gravity, Urine 1.009  1.005 - 1.030     pH 5.0  5.0 - 8.0     Glucose, UA NEGATIVE  NEGATIVE (mg/dL)    Hgb urine dipstick NEGATIVE  NEGATIVE     Bilirubin Urine NEGATIVE  NEGATIVE     Ketones, ur  NEGATIVE  NEGATIVE (mg/dL)    Protein, ur NEGATIVE  NEGATIVE (mg/dL)    Urobilinogen, UA 0.2  0.0 - 1.0 (mg/dL)    Nitrite NEGATIVE  NEGATIVE     Leukocytes, UA NEGATIVE  NEGATIVE  MICROSCOPIC NOT DONE ON URINES WITH NEGATIVE PROTEIN, BLOOD, LEUKOCYTES, NITRITE, OR GLUCOSE <1000 mg/dL.    Radiological Exams on Admission: Dg Chest Port 1 View  09/20/2011  *RADIOLOGY REPORT*  Clinical Data: Diffuse edema.  PORTABLE CHEST - 1 VIEW  Comparison: 12/20/2010.  Findings: Trachea is midline.  Heart size stable.  Subtle interstitial prominence may be chronic.  No pleural fluid.  IMPRESSION: Subtle interstitial prominence appears chronic.  No acute findings.  Original Report Authenticated By: Reyes Ivan, M.D.    Assessment/Plan Principal Problem:  */Acute Diastolic congestive heart failure /Anasarca Reported  chest pain Active Problems: Hyperkalemia CKD III  HYPERTENSION  PULMONARY HYPERTENSION, SECONDARY  Chest pain  Diabetes mellitus type II  Obesity hypoventilation syndrome Plan Admit to telemetry. cycle cardiac enzymes  Diurese with IV Lasix 80 mg twice a day  check 2-D echocardiogram  hold losartan and potassium supplementation given hyperkalemia. Adjust metoprolol dose as needed for blood pressure control. Strict  input and output, daily weight. Wound care consultation  Noted to be hypoglycemic in the ER with blood glucose of 69, will Lantus insulin and place on insulin sliding scale. Check hemoglobin A1c. Send stool for occult blood  For  Questionable rectal bleed which is probably due to constipation. Place  on Laxative regimen. Her hemoglobin is around baseline level. Ful code    Time Spent on Admission: 50 minutes   Kelly [name] 09/20/2011, 4:03 PM

## 2011-09-20 NOTE — ED Notes (Signed)
Blood sugar - 69 - MD aware and snack and juice given per order. Patient A & O x 3

## 2011-09-20 NOTE — ED Notes (Signed)
Pt presents with 2-3 day h/o midsternal chest pain that has been constant and does not radiate.  Pt reports shortness of breath   Pt reports BLE edema from ankles up to her groin.  Pt compliant with lasix (80mg  daily).

## 2011-09-20 NOTE — ED Notes (Signed)
Pt requesting something for discomfort in her legs, EDP made aware

## 2011-09-20 NOTE — ED Notes (Addendum)
Attempted IV without success x 2 due to infiltration with fluids. 3rd attempt was successful.

## 2011-09-20 NOTE — ED Notes (Signed)
Nicole in Bed control notified re pt need for bariatric bed upon admission to floor.

## 2011-09-20 NOTE — ED Provider Notes (Signed)
History     CSN: [id number]  Arrival date & time 09/20/11  1033   First MD Initiated Contact with Patient 09/20/11 1057      Chief Complaint  Patient presents with  . Chest Pain    (Consider location/radiation/quality/duration/timing/severity/associated sxs/prior treatment) Patient is a 66 y.o. female presenting with chest pain. The history is provided by the patient.  Chest Pain Primary symptoms include shortness of breath. Pertinent negatives for primary symptoms include no abdominal pain, no nausea and no vomiting.  Pertinent negatives for associated symptoms include no numbness and no weakness.    patient's had shortness of breath for the last couple weeks. For the last 2-3 days his been worse with some dull chest pain. No cough. No fevers. She has bilateral lower extremity edema. Some of this is chronic but is gotten worse. She also has a history of lymphedema. She states that she went to see her doctor Dr. Andi Devon, he stated she did come to the hospital and be admitted. She's been on Lasix. She states she's been taking it.  Past Medical History  Diagnosis Date  . Hypertension   . Diabetes mellitus   . Hyperlipidemia   . Sleep apnea   . CHF (congestive heart failure)     Past Surgical History  Procedure Date  . Rotator cuff repair   . Knee arthroscopy   . Temple biopsy     Family History  Problem Relation Age of Onset  . Emphysema Father   . Asthma Brother   . Heart disease Mother     grandmother  . Stomach cancer Brother     History  Substance Use Topics  . Smoking status: Not on file  . Smokeless tobacco: Not on file  . Alcohol Use:     OB History    Grav Para Term Preterm Abortions TAB SAB Ect Mult Living                  Review of Systems  Constitutional: Negative for activity change and appetite change.  HENT: Negative for neck stiffness.   Eyes: Negative for pain.  Respiratory: Positive for shortness of breath. Negative for chest  tightness.   Cardiovascular: Positive for chest pain and leg swelling.  Gastrointestinal: Negative for nausea, vomiting, abdominal pain and diarrhea.  Genitourinary: Negative for flank pain.  Musculoskeletal: Positive for back pain.  Skin: Negative for rash.  Neurological: Negative for weakness, numbness and headaches.  Psychiatric/Behavioral: Negative for behavioral problems.    Allergies  Penicillins  Home Medications   Current Outpatient Rx  Name Route Sig Dispense Refill  . ALLOPURINOL 100 MG PO TABS Oral Take 200 mg by mouth daily.    . ASPIRIN 81 MG PO TABS Oral Take 81 mg by mouth daily.    . ATORVASTATIN CALCIUM 40 MG PO TABS Oral Take 40 mg by mouth at bedtime.    . COLCHICINE 0.6 MG PO TABS Oral Take 0.6 mg by mouth 2 (two) times daily.    . DEXLANSOPRAZOLE 60 MG PO CPDR Oral Take 60 mg by mouth daily.    . FUROSEMIDE 80 MG PO TABS Oral Take 80 mg by mouth daily.    Marland Kitchen GABAPENTIN 300 MG PO CAPS Oral Take 300 mg by mouth 3 (three) times daily.    . INSULIN GLARGINE 100 UNIT/ML [place] SOLN Subcutaneous Inject 55 Units into the skin at bedtime. As directed    . ISOSORBIDE MONONITRATE 10 MG PO TABS Oral Take 10 mg  by mouth 2 (two) times daily.    Marland Kitchen LOSARTAN POTASSIUM 50 MG PO TABS Oral Take 25-50 mg by mouth 2 (two) times daily. Patient takes 1 tablet in the morning and 1/2 tablet at night    . METOPROLOL TARTRATE 25 MG PO TABS Oral Take 12.5 mg by mouth 2 (two) times daily.    . OXYCODONE-ACETAMINOPHEN 7.5-325 MG PO TABS Oral Take 2 tablets by mouth every 6 (six) hours as needed. For pain    . POTASSIUM CHLORIDE CRYS ER 20 MEQ PO TBCR Oral Take 20 mEq by mouth daily.    Marland Kitchen PROMETHAZINE HCL 25 MG PO TABS Oral Take 25 mg by mouth 2 (two) times daily as needed. For nausea      BP 145/56  Pulse 71  Temp(Src) 97.9 F (36.6 C) (Oral)  Resp 20  Ht 5\' 6"  (1.676 m)  Wt 390 lb (176.903 kg)  BMI 62.95 kg/m2  SpO2 100%  Physical Exam  Nursing note and vitals  reviewed. Constitutional: She is oriented to person, place, and time. She appears well-developed and well-nourished.       Patient is morbidly obese  HENT:  Head: Normocephalic and atraumatic.  Eyes: EOM are normal. Pupils are equal, round, and reactive to light.  Neck: Normal range of motion. Neck supple.  Cardiovascular: Normal rate, regular rhythm and normal heart sounds.   No murmur heard. Pulmonary/Chest: Effort normal. No respiratory distress. She has no wheezes. She has rales.       Rales at bilateral bases  Abdominal: Soft. Bowel sounds are normal. She exhibits no distension. There is no tenderness. There is no rebound and no guarding.  Musculoskeletal: She exhibits edema.       Bilateral lower extremity lymphedema with some weeping edema behind her knees.  Neurological: She is alert and oriented to person, place, and time. No cranial nerve deficit.  Skin: Skin is warm and dry.  Psychiatric: She has a normal mood and affect. Her speech is normal.    ED Course  Procedures (including critical care time)  Labs Reviewed  CBC - Abnormal; Notable for the following:    Hemoglobin 10.7 (*)    HCT 34.5 (*)    RDW 16.5 (*)    All other components within normal limits  DIFFERENTIAL - Abnormal; Notable for the following:    Eosinophils Relative 7 (*)    All other components within normal limits  BASIC METABOLIC PANEL - Abnormal; Notable for the following:    Potassium 5.4 (*)    BUN 29 (*)    Creatinine, Ser 1.47 (*)    GFR calc non Af Amer 37 (*)    GFR calc Af Amer 42 (*)    All other components within normal limits  PRO B NATRIURETIC PEPTIDE - Abnormal; Notable for the following:    Pro B Natriuretic peptide (BNP) 606.2 (*)    All other components within normal limits  GLUCOSE, CAPILLARY - Abnormal; Notable for the following:    Glucose-Capillary 69 (*)    All other components within normal limits  URINALYSIS, ROUTINE W REFLEX MICROSCOPIC - Abnormal; Notable for the  following:    APPearance HAZY (*)    All other components within normal limits  TROPONIN I  URINE CULTURE   Dg Chest Port 1 View  09/20/2011  *RADIOLOGY REPORT*  Clinical Data: Diffuse edema.  PORTABLE CHEST - 1 VIEW  Comparison: 12/20/2010.  Findings: Trachea is midline.  Heart size stable.  Subtle interstitial prominence  may be chronic.  No pleural fluid.  IMPRESSION: Subtle interstitial prominence appears chronic.  No acute findings.  Original Report Authenticated By: Reyes Ivan, M.D.     1. CHF (congestive heart failure)      Date: 09/20/2011  Rate: 90  Rhythm: normal sinus rhythm  QRS Axis: left  Intervals: normal  ST/T Wave abnormalities: normal  Conduction Disutrbances:right bundle branch block  Narrative Interpretation:   Old EKG Reviewed: unchanged    MDM  Patient comes from primary care doctor's office with shortness of breath and chest pain. History of CHF. X-ray shows chronic changes. She's caring some peripheral edema as the mildly elevated BNP. She will be admitted for CHF.        Juliet Rude. Rubin Payor, MD 09/20/11 1431

## 2011-09-21 ENCOUNTER — Encounter (HOSPITAL_COMMUNITY): Payer: Self-pay | Admitting: Radiology

## 2011-09-21 ENCOUNTER — Inpatient Hospital Stay (HOSPITAL_COMMUNITY): Payer: Medicare Other

## 2011-09-21 DIAGNOSIS — I517 Cardiomegaly: Secondary | ICD-10-CM

## 2011-09-21 LAB — URINE CULTURE
Colony Count: NO GROWTH
Culture  Setup Time: 201301241353
Culture: NO GROWTH

## 2011-09-21 LAB — CBC
MCHC: 31 g/dL (ref 30.0–36.0)
MCV: 83.9 fL (ref 78.0–100.0)
Platelets: 238 10*3/uL (ref 150–400)
RDW: 16.8 % — ABNORMAL HIGH (ref 11.5–15.5)
WBC: 4.9 10*3/uL (ref 4.0–10.5)

## 2011-09-21 LAB — GLUCOSE, CAPILLARY: Glucose-Capillary: 89 mg/dL (ref 70–99)

## 2011-09-21 LAB — BASIC METABOLIC PANEL
BUN: 24 mg/dL — ABNORMAL HIGH (ref 6–23)
CO2: 23 mEq/L (ref 19–32)
Calcium: 8.8 mg/dL (ref 8.4–10.5)
Creatinine, Ser: 1.17 mg/dL — ABNORMAL HIGH (ref 0.50–1.10)

## 2011-09-21 LAB — MRSA PCR SCREENING: MRSA by PCR: POSITIVE — AB

## 2011-09-21 LAB — HEMOGLOBIN A1C: Hgb A1c MFr Bld: 6.2 % — ABNORMAL HIGH (ref <5.7)

## 2011-09-21 MED ORDER — ENOXAPARIN SODIUM 80 MG/0.8ML ~~LOC~~ SOLN
80.0000 mg | SUBCUTANEOUS | Status: DC
  Administered 2011-09-21 – 2011-09-22 (×2): 80 mg via SUBCUTANEOUS
  Filled 2011-09-21 (×4): qty 0.8

## 2011-09-21 MED ORDER — IOHEXOL 350 MG/ML SOLN
80.0000 mL | Freq: Once | INTRAVENOUS | Status: AC | PRN
  Administered 2011-09-21: 80 mL via INTRAVENOUS

## 2011-09-21 MED ORDER — FUROSEMIDE 80 MG PO TABS
80.0000 mg | ORAL_TABLET | Freq: Two times a day (BID) | ORAL | Status: DC
  Administered 2011-09-21 – 2011-09-23 (×6): 80 mg via ORAL
  Filled 2011-09-21 (×7): qty 1

## 2011-09-21 MED ORDER — METOPROLOL TARTRATE 25 MG PO TABS
25.0000 mg | ORAL_TABLET | Freq: Two times a day (BID) | ORAL | Status: DC
  Administered 2011-09-21 – 2011-09-23 (×4): 25 mg via ORAL
  Filled 2011-09-21 (×5): qty 1

## 2011-09-21 NOTE — Progress Notes (Signed)
*  PRELIMINARY RESULTS* Echocardiogram 2D Echocardiogram has been performed.  Clide Deutscher 09/21/2011, 11:16 AM

## 2011-09-21 NOTE — Progress Notes (Signed)
Patient ID: Kelly [name], female   DOB: August 04, 1946, 66 y.o.   MRN: [id number] Subjective: Patient seen. Complained of mild epigastric discomfort. No chest pain,but mild shortness of breath.  Objective: Weight change:   Intake/Output Summary (Last 24 hours) at 09/21/11 1345 Last data filed at 09/21/11 1000  Gross per 24 hour  Intake   1080 ml  Output   2775 ml  Net  -1695 ml   BP 161/59  Pulse 79  Temp(Src) 97.8 F (36.6 C) (Oral)  Resp 18  Ht 5\' 6"  (1.676 m)  Wt 173.6 kg (382 lb 11.5 oz)  BMI 61.77 kg/m2  SpO2 100% Physical Exam: General appearance: Morbidly obese ,alert, cooperative and no distress Head: Normocephalic, without obvious abnormality, atraumatic Neck: no adenopathy, no carotid bruit, no JVD, supple, symmetrical, trachea midline and thyroid not enlarged, symmetric, no tenderness/mass/nodules Lungs: Decreased breath sounds at the lung bases secondary to body habitus. Heart: regular rate and rhythm, S1, S2 normal, no murmur, click, rub or gallop Abdomen: Obese soft, non-tender; organs difficulty to palpate because of body habitus, bowel sounds are positive. Extremities: Pedal edema Skin: Multiple folds of skin around the thigh with hyperpigmentation of the lower extremity.  Lab Results: Results for orders placed during the hospital encounter of 09/20/11 (from the past 48 hour(s))  CBC     Status: Abnormal   Collection Time   09/20/11 11:52 AM      Component Value Range Comment   WBC 6.8  4.0 - 10.5 (K/uL)    RBC 4.08  3.87 - 5.11 (MIL/uL)    Hemoglobin 10.7 (*) 12.0 - 15.0 (g/dL)    HCT 78.2 (*) 95.6 - 46.0 (%)    MCV 84.6  78.0 - 100.0 (fL)    MCH 26.2  26.0 - 34.0 (pg)    MCHC 31.0  30.0 - 36.0 (g/dL)    RDW 21.3 (*) 08.6 - 15.5 (%)    Platelets 291  150 - 400 (K/uL)   DIFFERENTIAL     Status: Abnormal   Collection Time   09/20/11 11:52 AM      Component Value Range Comment   Neutrophils Relative 69  43 - 77 (%)    Neutro Abs 4.7  1.7 - 7.7 (K/uL)    Lymphocytes Relative 18  12 - 46 (%)    Lymphs Abs 1.2  0.7 - 4.0 (K/uL)    Monocytes Relative 6  3 - 12 (%)    Monocytes Absolute 0.4  0.1 - 1.0 (K/uL)    Eosinophils Relative 7 (*) 0 - 5 (%)    Eosinophils Absolute 0.5  0.0 - 0.7 (K/uL)    Basophils Relative 0  0 - 1 (%)    Basophils Absolute 0.0  0.0 - 0.1 (K/uL)   BASIC METABOLIC PANEL     Status: Abnormal   Collection Time   09/20/11 11:52 AM      Component Value Range Comment   Sodium 140  135 - 145 (mEq/L)    Potassium 5.4 (*) 3.5 - 5.1 (mEq/L)    Chloride 108  96 - 112 (mEq/L)    CO2 20  19 - 32 (mEq/L)    Glucose, Bld 80  70 - 99 (mg/dL)    BUN 29 (*) 6 - 23 (mg/dL)    Creatinine, Ser 5.78 (*) 0.50 - 1.10 (mg/dL)    Calcium 9.3  8.4 - 10.5 (mg/dL)    GFR calc non Af Amer 37 (*) >90 (mL/min)  GFR calc Af Amer 42 (*) >90 (mL/min)   PRO B NATRIURETIC PEPTIDE     Status: Abnormal   Collection Time   09/20/11 11:52 AM      Component Value Range Comment   Pro B Natriuretic peptide (BNP) 606.2 (*) 0 - 125 (pg/mL)   TROPONIN I     Status: Normal   Collection Time   09/20/11 11:52 AM      Component Value Range Comment   Troponin I <0.30  <0.30 (ng/mL)   GLUCOSE, CAPILLARY     Status: Abnormal   Collection Time   09/20/11 11:54 AM      Component Value Range Comment   Glucose-Capillary 69 (*) 70 - 99 (mg/dL)   URINALYSIS, ROUTINE W REFLEX MICROSCOPIC     Status: Abnormal   Collection Time   09/20/11  1:23 PM      Component Value Range Comment   Color, Urine YELLOW  YELLOW     APPearance HAZY (*) CLEAR     Specific Gravity, Urine 1.009  1.005 - 1.030     pH 5.0  5.0 - 8.0     Glucose, UA NEGATIVE  NEGATIVE (mg/dL)    Hgb urine dipstick NEGATIVE  NEGATIVE     Bilirubin Urine NEGATIVE  NEGATIVE     Ketones, ur NEGATIVE  NEGATIVE (mg/dL)    Protein, ur NEGATIVE  NEGATIVE (mg/dL)    Urobilinogen, UA 0.2  0.0 - 1.0 (mg/dL)    Nitrite NEGATIVE  NEGATIVE     Leukocytes, UA NEGATIVE  NEGATIVE  MICROSCOPIC NOT DONE ON URINES WITH  NEGATIVE PROTEIN, BLOOD, LEUKOCYTES, NITRITE, OR GLUCOSE <1000 mg/dL.  URINE CULTURE     Status: Normal   Collection Time   09/20/11  1:23 PM      Component Value Range Comment   Specimen Description URINE, CATHETERIZED      Special Requests NONE      Setup Time [id number]      Colony Count NO GROWTH      Culture NO GROWTH      Report Status 09/21/2011 FINAL     HEMOGLOBIN A1C     Status: Abnormal   Collection Time   09/20/11  8:14 PM      Component Value Range Comment   Hemoglobin A1C 6.2 (*) <5.7 (%)    Mean Plasma Glucose 131 (*) <117 (mg/dL)   CBC     Status: Abnormal   Collection Time   09/20/11  8:14 PM      Component Value Range Comment   WBC 5.4  4.0 - 10.5 (K/uL)    RBC 4.01  3.87 - 5.11 (MIL/uL)    Hemoglobin 10.6 (*) 12.0 - 15.0 (g/dL)    HCT 81.1 (*) 91.4 - 46.0 (%)    MCV 84.3  78.0 - 100.0 (fL)    MCH 26.4  26.0 - 34.0 (pg)    MCHC 31.4  30.0 - 36.0 (g/dL)    RDW 78.2 (*) 95.6 - 15.5 (%)    Platelets 233  150 - 400 (K/uL)   CREATININE, SERUM     Status: Abnormal   Collection Time   09/20/11  8:14 PM      Component Value Range Comment   Creatinine, Ser 1.22 (*) 0.50 - 1.10 (mg/dL)    GFR calc non Af Amer 46 (*) >90 (mL/min)    GFR calc Af Amer 53 (*) >90 (mL/min)   GLUCOSE, CAPILLARY     Status: Abnormal  Collection Time   09/20/11 10:15 PM      Component Value Range Comment   Glucose-Capillary 113 (*) 70 - 99 (mg/dL)    Comment 1 Notify RN      Comment 2 Documented in Chart     CARDIAC PANEL(CRET KIN+CKTOT+MB+TROPI)     Status: Normal   Collection Time   09/20/11 10:47 PM      Component Value Range Comment   Total CK 158  7 - 177 (U/L)    CK, MB 2.2  0.3 - 4.0 (ng/mL)    Troponin I <0.30  <0.30 (ng/mL)    Relative Index 1.4  0.0 - 2.5    GLUCOSE, CAPILLARY     Status: Normal   Collection Time   09/21/11  6:14 AM      Component Value Range Comment   Glucose-Capillary 89  70 - 99 (mg/dL)   BASIC METABOLIC PANEL     Status: Abnormal   Collection Time     09/21/11  6:19 AM      Component Value Range Comment   Sodium 141  135 - 145 (mEq/L)    Potassium 5.1  3.5 - 5.1 (mEq/L)    Chloride 111  96 - 112 (mEq/L)    CO2 23  19 - 32 (mEq/L)    Glucose, Bld 91  70 - 99 (mg/dL)    BUN 24 (*) 6 - 23 (mg/dL)    Creatinine, Ser 1.47 (*) 0.50 - 1.10 (mg/dL)    Calcium 8.8  8.4 - 10.5 (mg/dL)    GFR calc non Af Amer 48 (*) >90 (mL/min)    GFR calc Af Amer 56 (*) >90 (mL/min)   CBC     Status: Abnormal   Collection Time   09/21/11  6:19 AM      Component Value Range Comment   WBC 4.9  4.0 - 10.5 (K/uL)    RBC 3.42 (*) 3.87 - 5.11 (MIL/uL)    Hemoglobin 8.9 (*) 12.0 - 15.0 (g/dL)    HCT 82.9 (*) 56.2 - 46.0 (%)    MCV 83.9  78.0 - 100.0 (fL)    MCH 26.0  26.0 - 34.0 (pg)    MCHC 31.0  30.0 - 36.0 (g/dL)    RDW 13.0 (*) 86.5 - 15.5 (%)    Platelets 238  150 - 400 (K/uL)   CARDIAC PANEL(CRET KIN+CKTOT+MB+TROPI)     Status: Normal   Collection Time   09/21/11  6:19 AM      Component Value Range Comment   Total CK 139  7 - 177 (U/L)    CK, MB 1.8  0.3 - 4.0 (ng/mL)    Troponin I <0.30  <0.30 (ng/mL)    Relative Index 1.3  0.0 - 2.5    MRSA PCR SCREENING     Status: Abnormal   Collection Time   09/21/11  6:56 AM      Component Value Range Comment   MRSA by PCR POSITIVE (*) NEGATIVE    GLUCOSE, CAPILLARY     Status: Abnormal   Collection Time   09/21/11 12:24 PM      Component Value Range Comment   Glucose-Capillary 101 (*) 70 - 99 (mg/dL)    Comment 1 Documented in Chart      Comment 2 Notify RN       Micro Results: Recent Results (from the past 240 hour(s))  URINE CULTURE     Status: Normal   Collection Time   09/20/11  1:23 PM      Component Value Range Status Comment   Specimen Description URINE, CATHETERIZED   Final    Special Requests NONE   Final    Setup Time [id number]   Final    Colony Count NO GROWTH   Final    Culture NO GROWTH   Final    Report Status 09/21/2011 FINAL   Final   MRSA PCR SCREENING     Status: Abnormal    Collection Time   09/21/11  6:56 AM      Component Value Range Status Comment   MRSA by PCR POSITIVE (*) NEGATIVE  Final     Studies/Results: Dg Chest Port 1 View  09/20/2011  *RADIOLOGY REPORT*  Clinical Data: Diffuse edema.  PORTABLE CHEST - 1 VIEW  Comparison: 12/20/2010.  Findings: Trachea is midline.  Heart size stable.  Subtle interstitial prominence may be chronic.  No pleural fluid.  IMPRESSION: Subtle interstitial prominence appears chronic.  No acute findings.  Original Report Authenticated By: Reyes Ivan, M.D.   Medications: Scheduled Meds:   . allopurinol  200 mg Oral Daily  . aspirin EC  81 mg Oral Daily  . colchicine  0.6 mg Oral BID  . enoxaparin  80 mg Subcutaneous Q24H  . furosemide  80 mg Oral BID  . gabapentin  300 mg Oral TID  . insulin aspart  0-15 Units Subcutaneous TID WC  . insulin aspart  0-5 Units Subcutaneous QHS  . isosorbide mononitrate  10 mg Oral BID  . metoprolol tartrate  12.5 mg Oral BID  . oxyCODONE-acetaminophen  1 tablet Oral Once  . pantoprazole  40 mg Oral Q1200  . rosuvastatin  20 mg Oral Daily  . sodium chloride  3 mL Intravenous Q12H  . DISCONTD: aspirin EC  81 mg Oral Daily  . DISCONTD: aspirin  81 mg Oral Daily  . DISCONTD: enoxaparin  40 mg Subcutaneous Q24H  . DISCONTD: furosemide  80 mg Intravenous BID  . DISCONTD: pantoprazole  40 mg Oral Q1200   Continuous Infusions:   . DISCONTD: sodium chloride Stopped (09/20/11 1510)   PRN Meds:.sodium chloride, ondansetron (ZOFRAN) IV, ondansetron, oxyCODONE-acetaminophen, promethazine, senna-docusate, sodium chloride, DISCONTD: oxyCODONE-acetaminophen  Assessment/Plan: #1 Shortness of breath questionable secondary to pulmonary hypertension or CHF rule out pulmonary embolism. Plan is to order CT angiogram of the thorax   #2 HYPERTENSION-we'll adjust the dose of Lopressor.   #3 PULMONARY HYPERTENSION, SECONDARY  #4 Diabetes mellitus-we'll discontinue Lantus insulin hemoglobin A1  C. is 6.2. We continue insulin sliding scale   #5 Obesity hypoventilation syndrome #6 History of obstructive sleep apnea #7 History of chronic venostasis with ulceration-we continue diuresis. #8 History of gout-continue colchicine   LOS: 1 day   Levone Otten 09/21/2011, 1:45 PM

## 2011-09-21 NOTE — Consult Note (Signed)
WOC consult Note Reason for Consult: req. To eval. Pt for topical wound care for LE ulcers.  Pt well known to this WOC nurse. 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 She has dealt with this issue for more than 3 years, injury and sheer occurs when she transfers from the bed,chair or couch in her home.  This area tends to be moist at all times due to her size and the skin is very fragile.  Pt is followed by the [facility] for this problem as well. I have recommended in the past for lymphedema treatments with wraps per rehab, she has been eval. By [facility] therapist for this tx but could not afford the wraps and lives alone which limits the ability to have wraps changed as needed.   Wound type: MASD (moisture associated skin damage), 7 full thickness open area of the posterior bil. Thighs. Also noted great deal of moisture in the R LE skin fold but not real skin breakdown noted.  Assessment of other skin folds did not reveal these to be problematic    Measurement: L thigh, 4 open areas largest 3cm x 2cm x0.2cm;  L thigh, 2 open areas largest 3.0cm x 2.5cm x 0.2cm  Wound ZOX:WRUE, moist and free of necrotic tissue Drainage (amount, consistency, odor) moderate, with some bleeding at one of the open areas, no odor.   Periwound:intact but scarred, has moderate amount of maceration Dressing procedure/placement/frequency: silicone foam dressings placed on all open areas of the posterior thigh, this will protect and insulate the wound for moist wound healing environment and manage drainage. May need to be changed more freq. Due to location.  Will order InterDry AG for LE skin fold to manage excess moisture and prevent breakdown.    Discussed POC with bedside RN and with pt. WOC will follow   Thanks  [employer], CWOCN [phone])

## 2011-09-21 NOTE — Telephone Encounter (Signed)
[email or web address]  

## 2011-09-21 NOTE — Progress Notes (Signed)
Utilization review complete 

## 2011-09-22 LAB — COMPREHENSIVE METABOLIC PANEL
ALT: 13 U/L (ref 0–35)
BUN: 22 mg/dL (ref 6–23)
CO2: 24 mEq/L (ref 19–32)
Calcium: 8.7 mg/dL (ref 8.4–10.5)
Creatinine, Ser: 1.19 mg/dL — ABNORMAL HIGH (ref 0.50–1.10)
GFR calc Af Amer: 55 mL/min — ABNORMAL LOW (ref >90)
GFR calc non Af Amer: 47 mL/min — ABNORMAL LOW (ref >90)
Glucose, Bld: 101 mg/dL — ABNORMAL HIGH (ref 70–99)
Sodium: 139 mEq/L (ref 135–145)

## 2011-09-22 LAB — CBC
HCT: 28.5 % — ABNORMAL LOW (ref 36.0–46.0)
Hemoglobin: 9 g/dL — ABNORMAL LOW (ref 12.0–15.0)
MCH: 26.2 pg (ref 26.0–34.0)
MCV: 83.1 fL (ref 78.0–100.0)
RBC: 3.43 MIL/uL — ABNORMAL LOW (ref 3.87–5.11)

## 2011-09-22 LAB — GLUCOSE, CAPILLARY
Glucose-Capillary: 91 mg/dL (ref 70–99)
Glucose-Capillary: 137 mg/dL — ABNORMAL HIGH (ref 70–99)
Glucose-Capillary: 124 mg/dL — ABNORMAL HIGH (ref 70–99)

## 2011-09-22 MED ORDER — HYDRALAZINE HCL 25 MG PO TABS
25.0000 mg | ORAL_TABLET | Freq: Four times a day (QID) | ORAL | Status: DC
  Administered 2011-09-22 – 2011-09-23 (×4): 25 mg via ORAL
  Filled 2011-09-22 (×7): qty 1

## 2011-09-22 NOTE — Progress Notes (Signed)
Pt with 7 bt run vtach on monitor @ 1957.  Night shift MD notified.  Pt's BP 162/76.  Pt denies any complaints at this time resting comfortably in bed.  Will continue to monitor.

## 2011-09-22 NOTE — Progress Notes (Signed)
Patient ID: Kelly [name], female   DOB: 1945-11-08, 66 y.o.   MRN: [id number] Patient ID: Kelly [name], female   DOB: 31-Jul-1946, 66 y.o.   MRN: [id number] Subjective: Patient seen. Denies any shortness of breath. No chest pain.  Objective: Weight change:   Intake/Output Summary (Last 24 hours) at 09/22/11 1449 Last data filed at 09/22/11 1200  Gross per 24 hour  Intake    243 ml  Output   2000 ml  Net  -1757 ml   BP 172/82  Pulse 74  Temp(Src) 98.3 F (36.8 C) (Oral)  Resp 20  Ht 5\' 6"  (1.676 m)  Wt 173.6 kg (382 lb 11.5 oz)  BMI 61.77 kg/m2  SpO2 94% Physical Exam: General appearance: Morbidly obese ,alert, cooperative and no distress Head: Normocephalic, without obvious abnormality, atraumatic Neck: no adenopathy, no carotid bruit, no JVD, supple, symmetrical, trachea midline and thyroid not enlarged, symmetric, no tenderness/mass/nodules Lungs: Decreased breath sounds at the lung bases secondary to body habitus. Heart: regular rate and rhythm, S1, S2 normal, no murmur, click, rub or gallop Abdomen: Obese soft, non-tender; organs difficulty to palpate because of body habitus, bowel sounds are positive. Extremities: Trace Pedal edema Skin: Multiple folds of skin around the thigh with hyperpigmentation of the lower extremity,ulcer right thigh  Lab Results: Results for orders placed during the hospital encounter of 09/20/11 (from the past 48 hour(s))  HEMOGLOBIN A1C     Status: Abnormal   Collection Time   09/20/11  8:14 PM      Component Value Range Comment   Hemoglobin A1C 6.2 (*) <5.7 (%)    Mean Plasma Glucose 131 (*) <117 (mg/dL)   CBC     Status: Abnormal   Collection Time   09/20/11  8:14 PM      Component Value Range Comment   WBC 5.4  4.0 - 10.5 (K/uL)    RBC 4.01  3.87 - 5.11 (MIL/uL)    Hemoglobin 10.6 (*) 12.0 - 15.0 (g/dL)    HCT 84.1 (*) 32.4 - 46.0 (%)    MCV 84.3  78.0 - 100.0 (fL)    MCH 26.4  26.0 - 34.0 (pg)    MCHC 31.4  30.0 - 36.0 (g/dL)    RDW 40.1 (*) 02.7 - 15.5 (%)    Platelets 233  150 - 400 (K/uL)   CREATININE, SERUM     Status: Abnormal   Collection Time   09/20/11  8:14 PM      Component Value Range Comment   Creatinine, Ser 1.22 (*) 0.50 - 1.10 (mg/dL)    GFR calc non Af Amer 46 (*) >90 (mL/min)    GFR calc Af Amer 53 (*) >90 (mL/min)   GLUCOSE, CAPILLARY     Status: Abnormal   Collection Time   09/20/11 10:15 PM      Component Value Range Comment   Glucose-Capillary 113 (*) 70 - 99 (mg/dL)    Comment 1 Notify RN      Comment 2 Documented in Chart     CARDIAC PANEL(CRET KIN+CKTOT+MB+TROPI)     Status: Normal   Collection Time   09/20/11 10:47 PM      Component Value Range Comment   Total CK 158  7 - 177 (U/L)    CK, MB 2.2  0.3 - 4.0 (ng/mL)    Troponin I <0.30  <0.30 (ng/mL)    Relative Index 1.4  0.0 - 2.5    GLUCOSE, CAPILLARY     Status:  Normal   Collection Time   09/21/11  6:14 AM      Component Value Range Comment   Glucose-Capillary 89  70 - 99 (mg/dL)   BASIC METABOLIC PANEL     Status: Abnormal   Collection Time   09/21/11  6:19 AM      Component Value Range Comment   Sodium 141  135 - 145 (mEq/L)    Potassium 5.1  3.5 - 5.1 (mEq/L)    Chloride 111  96 - 112 (mEq/L)    CO2 23  19 - 32 (mEq/L)    Glucose, Bld 91  70 - 99 (mg/dL)    BUN 24 (*) 6 - 23 (mg/dL)    Creatinine, Ser 6.96 (*) 0.50 - 1.10 (mg/dL)    Calcium 8.8  8.4 - 10.5 (mg/dL)    GFR calc non Af Amer 48 (*) >90 (mL/min)    GFR calc Af Amer 56 (*) >90 (mL/min)   CBC     Status: Abnormal   Collection Time   09/21/11  6:19 AM      Component Value Range Comment   WBC 4.9  4.0 - 10.5 (K/uL)    RBC 3.42 (*) 3.87 - 5.11 (MIL/uL)    Hemoglobin 8.9 (*) 12.0 - 15.0 (g/dL)    HCT 29.5 (*) 28.4 - 46.0 (%)    MCV 83.9  78.0 - 100.0 (fL)    MCH 26.0  26.0 - 34.0 (pg)    MCHC 31.0  30.0 - 36.0 (g/dL)    RDW 13.2 (*) 44.0 - 15.5 (%)    Platelets 238  150 - 400 (K/uL)   CARDIAC PANEL(CRET KIN+CKTOT+MB+TROPI)     Status: Normal   Collection  Time   09/21/11  6:19 AM      Component Value Range Comment   Total CK 139  7 - 177 (U/L)    CK, MB 1.8  0.3 - 4.0 (ng/mL)    Troponin I <0.30  <0.30 (ng/mL)    Relative Index 1.3  0.0 - 2.5    MRSA PCR SCREENING     Status: Abnormal   Collection Time   09/21/11  6:56 AM      Component Value Range Comment   MRSA by PCR POSITIVE (*) NEGATIVE    GLUCOSE, CAPILLARY     Status: Abnormal   Collection Time   09/21/11 12:24 PM      Component Value Range Comment   Glucose-Capillary 101 (*) 70 - 99 (mg/dL)    Comment 1 Documented in Chart      Comment 2 Notify RN     GLUCOSE, CAPILLARY     Status: Abnormal   Collection Time   09/21/11  4:30 PM      Component Value Range Comment   Glucose-Capillary 130 (*) 70 - 99 (mg/dL)    Comment 1 Notify RN      Comment 2 Documented in Chart     GLUCOSE, CAPILLARY     Status: Abnormal   Collection Time   09/21/11 10:54 PM      Component Value Range Comment   Glucose-Capillary 134 (*) 70 - 99 (mg/dL)    Comment 1 Notify RN     GLUCOSE, CAPILLARY     Status: Normal   Collection Time   09/22/11  5:49 AM      Component Value Range Comment   Glucose-Capillary 91  70 - 99 (mg/dL)    Comment 1 Documented in Chart  Comment 2 Notify RN     CBC     Status: Abnormal   Collection Time   09/22/11  6:30 AM      Component Value Range Comment   WBC 5.8  4.0 - 10.5 (K/uL)    RBC 3.43 (*) 3.87 - 5.11 (MIL/uL)    Hemoglobin 9.0 (*) 12.0 - 15.0 (g/dL)    HCT 45.4 (*) 09.8 - 46.0 (%)    MCV 83.1  78.0 - 100.0 (fL)    MCH 26.2  26.0 - 34.0 (pg)    MCHC 31.6  30.0 - 36.0 (g/dL)    RDW 11.9 (*) 14.7 - 15.5 (%)    Platelets 229  150 - 400 (K/uL)   COMPREHENSIVE METABOLIC PANEL     Status: Abnormal   Collection Time   09/22/11  6:30 AM      Component Value Range Comment   Sodium 139  135 - 145 (mEq/L)    Potassium 4.7  3.5 - 5.1 (mEq/L)    Chloride 107  96 - 112 (mEq/L)    CO2 24  19 - 32 (mEq/L)    Glucose, Bld 101 (*) 70 - 99 (mg/dL)    BUN 22  6 - 23  (mg/dL)    Creatinine, Ser 8.29 (*) 0.50 - 1.10 (mg/dL)    Calcium 8.7  8.4 - 10.5 (mg/dL)    Total Protein 6.5  6.0 - 8.3 (g/dL)    Albumin 2.2 (*) 3.5 - 5.2 (g/dL)    AST 13  0 - 37 (U/L)    ALT 13  0 - 35 (U/L)    Alkaline Phosphatase 106  39 - 117 (U/L)    Total Bilirubin 0.2 (*) 0.3 - 1.2 (mg/dL)    GFR calc non Af Amer 47 (*) >90 (mL/min)    GFR calc Af Amer 55 (*) >90 (mL/min)   GLUCOSE, CAPILLARY     Status: Abnormal   Collection Time   09/22/11 12:12 PM      Component Value Range Comment   Glucose-Capillary 123 (*) 70 - 99 (mg/dL)    Comment 1 Documented in Chart      Comment 2 Notify RN       Micro Results: Recent Results (from the past 240 hour(s))  URINE CULTURE     Status: Normal   Collection Time   09/20/11  1:23 PM      Component Value Range Status Comment   Specimen Description URINE, CATHETERIZED   Final    Special Requests NONE   Final    Setup Time [id number]   Final    Colony Count NO GROWTH   Final    Culture NO GROWTH   Final    Report Status 09/21/2011 FINAL   Final   MRSA PCR SCREENING     Status: Abnormal   Collection Time   09/21/11  6:56 AM      Component Value Range Status Comment   MRSA by PCR POSITIVE (*) NEGATIVE  Final     Studies/Results: Dg Chest Port 1 View  09/20/2011  *RADIOLOGY REPORT*  Clinical Data: Diffuse edema.  PORTABLE CHEST - 1 VIEW  Comparison: 12/20/2010.  Findings: Trachea is midline.  Heart size stable.  Subtle interstitial prominence may be chronic.  No pleural fluid.  IMPRESSION: Subtle interstitial prominence appears chronic.  No acute findings.  Original Report Authenticated By: Reyes Ivan, M.D.   Medications: Scheduled Meds:    . allopurinol  200 mg Oral Daily  .  aspirin EC  81 mg Oral Daily  . colchicine  0.6 mg Oral BID  . enoxaparin  80 mg Subcutaneous Q24H  . furosemide  80 mg Oral BID  . gabapentin  300 mg Oral TID  . insulin aspart  0-15 Units Subcutaneous TID WC  . insulin aspart  0-5 Units  Subcutaneous QHS  . isosorbide mononitrate  10 mg Oral BID  . metoprolol tartrate  25 mg Oral BID  . pantoprazole  40 mg Oral Q1200  . rosuvastatin  20 mg Oral Daily  . sodium chloride  3 mL Intravenous Q12H   Continuous Infusions:  PRN Meds:.sodium chloride, iohexol, ondansetron (ZOFRAN) IV, ondansetron, oxyCODONE-acetaminophen, promethazine, senna-docusate, sodium chloride  Assessment/Plan: #1 Shortness of breath questionable secondary to pulmonary hypertension or CHF. CT angiogram chest negative for pulmonary embolism.   #2 HYPERTENSION-we'll adjust the dose of Lopressor.   #3 PULMONARY HYPERTENSION, SECONDARY  #4 Diabetes mellitus-we'll discontinue Lantus insulin hemoglobin A1 C. is 6.2. We continue insulin sliding scale   #5 Obesity hypoventilation syndrome #6 History of obstructive sleep apnea #7 History of chronic venostasis with ulceration-we continue diuresis. #8 History of gout-continue colchicine #9 decubitus ulcer-wound care nurse following patient For possible DC in a.m. on 09/22/2008. Consult case Production designer, theatre/television/film.   LOS: 2 days   Jona Zappone 09/22/2011, 2:49 PM

## 2011-09-23 LAB — COMPREHENSIVE METABOLIC PANEL
AST: 24 U/L (ref 0–37)
CO2: 27 mEq/L (ref 19–32)
Chloride: 103 mEq/L (ref 96–112)
Creatinine, Ser: 1.23 mg/dL — ABNORMAL HIGH (ref 0.50–1.10)
GFR calc non Af Amer: 45 mL/min — ABNORMAL LOW (ref >90)
Glucose, Bld: 101 mg/dL — ABNORMAL HIGH (ref 70–99)
Total Bilirubin: 0.2 mg/dL — ABNORMAL LOW (ref 0.3–1.2)

## 2011-09-23 LAB — GLUCOSE, CAPILLARY: Glucose-Capillary: 112 mg/dL — ABNORMAL HIGH (ref 70–99)

## 2011-09-23 LAB — CBC
HCT: 29.5 % — ABNORMAL LOW (ref 36.0–46.0)
Hemoglobin: 9.3 g/dL — ABNORMAL LOW (ref 12.0–15.0)
MCV: 82.6 fL (ref 78.0–100.0)
Platelets: 269 10*3/uL (ref 150–400)
RBC: 3.57 MIL/uL — ABNORMAL LOW (ref 3.87–5.11)
WBC: 6.6 10*3/uL (ref 4.0–10.5)

## 2011-09-23 MED ORDER — SENNOSIDES-DOCUSATE SODIUM 8.6-50 MG PO TABS: 1.0000 | ORAL_TABLET | Freq: Every evening | ORAL | Status: DC | PRN

## 2011-09-23 MED ORDER — INSULIN GLARGINE 100 UNIT/ML ~~LOC~~ SOLN: 20.0000 [IU] | Freq: Every day | SUBCUTANEOUS | Status: DC

## 2011-09-23 MED ORDER — BACITRACIN 500 UNIT/GM EX OINT: 1.0000 "application " | TOPICAL_OINTMENT | Freq: Two times a day (BID) | CUTANEOUS | Status: AC

## 2011-09-23 MED ORDER — HYDRALAZINE HCL 25 MG PO TABS: 25.0000 mg | ORAL_TABLET | Freq: Three times a day (TID) | ORAL | Status: DC

## 2011-09-23 NOTE — Progress Notes (Signed)
Attempted to remove foley catheter per protocol.  Pt. Refused at this time.  Stated "I will get it taken out after breakfast." Explained to patient need for foley to be removed and void prior to d/c.  Pt agreeable to have foley removed this am but after breakfast.

## 2011-09-23 NOTE — Discharge Summary (Signed)
DISCHARGE SUMMARY  MAEOLA MCHANEY  MR#: [id number]  DOB:August 16, 1946  Date of Admission: 09/20/2011 Date of Discharge: 09/23/2011  Attending Physician:Delania Ferg  Patient's JYN:WGNFAOZ,HYQMVHQI R., MD, MD  Consults: Wound Care Nurse  Discharge Diagnoses: #1 shortness of breath-questionable secondary to pulmonary hypertensive or congestive heart failure(diastolic dysfunction) #2 morbid obesity. #3 pulmonary hypertension #4 congestive heart failure(diastolic dysfunction) #5 systemic hypertension #6 diabetes mellitus-hemoglobin A1c 6.2. Lantus insulin reduced to 20 units subcut QHS #7 hyperlipidemia #8 history of obesity hypoventilation syndrome #9 history of sleep apnea #10 gout #11 left t thigh decubitus #12 chronic kidney disease stage III.  Present on Admission:  .Anasarca .Diabetes mellitus type II .Obesity hypoventilation syndrome    Medication List  As of 09/23/2011  1:20 PM   TAKE these medications         allopurinol 100 MG tablet   Commonly known as: ZYLOPRIM   Take 200 mg by mouth daily.      aspirin 81 MG tablet   Take 81 mg by mouth daily.      atorvastatin 40 MG tablet   Commonly known as: LIPITOR   Take 40 mg by mouth at bedtime.      bacitracin 500 UNIT/GM ointment   Apply 1 application topically 2 (two) times daily. Apply nasally BID      colchicine 0.6 MG tablet   Take 0.6 mg by mouth 2 (two) times daily.      DEXILANT 60 MG capsule   Generic drug: dexlansoprazole   Take 60 mg by mouth daily.      furosemide 80 MG tablet   Commonly known as: LASIX   Take 80 mg by mouth daily.      gabapentin 300 MG capsule   Commonly known as: NEURONTIN   Take 300 mg by mouth 3 (three) times daily.      hydrALAZINE 25 MG tablet   Commonly known as: APRESOLINE   Take 1 tablet (25 mg total) by mouth 3 (three) times daily.      insulin glargine 100 UNIT/ML injection   Commonly known as: LANTUS   Inject 20 Units into the skin at bedtime. As  directed      isosorbide mononitrate 10 MG tablet   Commonly known as: ISMO,MONOKET   Take 10 mg by mouth 2 (two) times daily.      losartan 50 MG tablet   Commonly known as: COZAAR   Take 25-50 mg by mouth 2 (two) times daily. Patient takes 1 tablet in the morning and 1/2 tablet at night      metoprolol tartrate 25 MG tablet   Commonly known as: LOPRESSOR   Take 12.5 mg by mouth 2 (two) times daily.      oxyCODONE-acetaminophen 7.5-325 MG per tablet   Commonly known as: PERCOCET   Take 2 tablets by mouth every 6 (six) hours as needed. For pain      potassium chloride SA 20 MEQ tablet   Commonly known as: K-DUR,KLOR-CON   Take 20 mEq by mouth daily.      promethazine 25 MG tablet   Commonly known as: PHENERGAN   Take 25 mg by mouth 2 (two) times daily as needed. For nausea      senna-docusate 8.6-50 MG per tablet   Commonly known as: Senokot-S   Take 1 tablet by mouth at bedtime as needed.              Hospital Course: Patient is a 66 year old African American female, morbidly obese  with history of congestive heart failure-diastolic dysfunction, pulmonary hypertension, and diabetes mellitus was admitted to the hospital on 08/29/2011 with history of increasing shortness of breath of about 3 days duration and also progressive shortness of the lower extremity. She also complain about dull precordial discomfort. She denied any palpitation. She denied any nausea or vomiting. She denied any fever, chills or Rigors      In the emergency room, patient was not in any acute respiratory distress. Lung examination showed decreased entry bibasally. Initial vital signs: blood pressure was 172/82 pulse 74 respiratory rate 20.     Pertinent labs include; cardiac enzymes-unremarkable, proBNP 606.2, routine MRSA positive, and hemoglobin A1c 6.2. Imaging studies done include chest x-ray which did not show any acute abnormality, CT thorax-negative for pulmonary embolism and 2-D echo showed LVH with  EF of 60-65%-grade 1 diastolic dysfunction.     Patient was admitted to telemetry. She was started on an aggressive diuresis with Lasix. Patient's blood pressure was controlled with hydralazine. Also added to patient's regimen was Lopressor and Imdur. Patient was also given colchicine with history of chronic gout. Hemoglobin A1c done was 6.2 and subsequently Lantus insulin was temporarily discontinued and patient was placed on a regular insulin sliding scale. Patient's periodic pain was controlled with Percocet. She was also given Crestor for hyperlipidemia. In this admission she complained about constipation and subsequently was given Senokot. Patient was found to have left thigh decubitus ulcer and subsequently wound care nurse was consulted. She evaluated patient and dressing was done as per her instruction on daily basis. GI prophylaxis was done with Protonix and DVT prophylaxis with Lovenox.    Patient was evaluated on daily basis. She felt better. Shortness of breath was remarkably reduced. She denied any chest pain or palpitation. She also denied any fever chills or Rigors.   She was evaluated today. Denies any complaint. Vital signs are stable. Plan is for patient to be discharge home today to continue with home health and also maintain her appointment with the wound care clinic.   Day of Discharge BP 168/69  Pulse 67  Temp(Src) 98.4 F (36.9 C) (Oral)  Resp 18  Ht 5\' 6"  (1.676 m)  Wt 163.2 kg (359 lb 12.7 oz)  BMI 58.07 kg/m2  SpO2 95%  Physical Exam: Vitals as above HEENT-mild pallor Neck-no JVD Chest-decreased air entry diabetes secondary to body habitus CVS-S1 and S2 Abdomen-morbidly obese, organs difficult to palpate, nontender, bowel sounds are positive. Extremity-trace edema Skin-multiple folds of skin around the thigh muscle bilaterally with right thigh  decubitus. Neuro-nonfocal Neuropsych-appropriate affect.  Results for orders placed during the hospital encounter of  09/20/11 (from the past 24 hour(s))  GLUCOSE, CAPILLARY     Status: Abnormal   Collection Time   09/22/11  4:22 PM      Component Value Range   Glucose-Capillary 137 (*) 70 - 99 (mg/dL)   Comment 1 Documented in Chart     Comment 2 Notify RN    GLUCOSE, CAPILLARY     Status: Abnormal   Collection Time   09/22/11  9:57 PM      Component Value Range   Glucose-Capillary 124 (*) 70 - 99 (mg/dL)   Comment 1 Notify RN    CBC     Status: Abnormal   Collection Time   09/23/11  5:30 AM      Component Value Range   WBC 6.6  4.0 - 10.5 (K/uL)   RBC 3.57 (*) 3.87 - 5.11 (MIL/uL)  Hemoglobin 9.3 (*) 12.0 - 15.0 (g/dL)   HCT 96.0 (*) 45.4 - 46.0 (%)   MCV 82.6  78.0 - 100.0 (fL)   MCH 26.1  26.0 - 34.0 (pg)   MCHC 31.5  30.0 - 36.0 (g/dL)   RDW 09.8 (*) 11.9 - 15.5 (%)   Platelets 269  150 - 400 (K/uL)  COMPREHENSIVE METABOLIC PANEL     Status: Abnormal   Collection Time   09/23/11  5:30 AM      Component Value Range   Sodium 140  135 - 145 (mEq/L)   Potassium 4.7  3.5 - 5.1 (mEq/L)   Chloride 103  96 - 112 (mEq/L)   CO2 27  19 - 32 (mEq/L)   Glucose, Bld 101 (*) 70 - 99 (mg/dL)   BUN 23  6 - 23 (mg/dL)   Creatinine, Ser 1.47 (*) 0.50 - 1.10 (mg/dL)   Calcium 8.7  8.4 - 82.9 (mg/dL)   Total Protein 6.9  6.0 - 8.3 (g/dL)   Albumin 2.3 (*) 3.5 - 5.2 (g/dL)   AST 24  0 - 37 (U/L)   ALT 18  0 - 35 (U/L)   Alkaline Phosphatase 117  39 - 117 (U/L)   Total Bilirubin 0.2 (*) 0.3 - 1.2 (mg/dL)   GFR calc non Af Amer 45 (*) >90 (mL/min)   GFR calc Af Amer 53 (*) >90 (mL/min)  MAGNESIUM     Status: Abnormal   Collection Time   09/23/11  5:30 AM      Component Value Range   Magnesium 1.3 (*) 1.5 - 2.5 (mg/dL)  GLUCOSE, CAPILLARY     Status: Abnormal   Collection Time   09/23/11  6:18 AM      Component Value Range   Glucose-Capillary 107 (*) 70 - 99 (mg/dL)   Comment 1 Notify RN    GLUCOSE, CAPILLARY     Status: Abnormal   Collection Time   09/23/11 12:01 PM      Component Value Range    Glucose-Capillary 112 (*) 70 - 99 (mg/dL)   Comment 1 Documented in Chart     Comment 2 Notify RN      Disposition: Stable   Follow-up Appts: Discharge Orders    Future Appointments: Provider: Department: Dept Phone: Center:   10/31/2011 3:15 PM [facility] [phone] [facility]     Future Orders Please Complete By Expires   Diet - low sodium heart healthy      Increase activity slowly      Discharge instructions      Comments:   Resume Home Health. Continue with wound clinic. Follow up with pcp in 1-2weeks      Signed: Palestine Mosco 09/23/2011, 1:21 PM

## 2011-09-23 NOTE — Progress Notes (Signed)
   CARE MANAGEMENT NOTE 09/23/2011  Patient:  Kelly [name], Kelly [name]   Account Number:  [id number]  Date Initiated:  09/21/2011  Documentation initiated by:  Donn Pierini  Subjective/Objective Assessment:   Pt admitted with Anasarca / CHF     Action/Plan:   PTA pt lived at home alone, was independent with ADLs   Anticipated DC Date:  09/24/2011   Anticipated DC Plan:  HOME/SELF CARE      DC Planning Services  CM consult      [facility] Choice  HOME HEALTH  Resumption Of Svcs/PTA Provider   Choice offered to / List presented to:  C-1 Patient        HH arranged  HH-1 RN      [facility] agency  [facility]   Status of service:  Completed, signed off Medicare Important Message given?   (If response is "NO", the following Medicare IM given date fields will be blank) Date Medicare IM given:   Date Additional Medicare IM given:    Discharge Disposition:  HOME W HOME HEALTH SERVICES  Per UR Regulation:    Comments:  09/23/2011 1530 Faxed resumption of care orders to [facility]. Isidoro Donning RN CCM Case Mgmt phone [phone]  09/23/2011 1000 Spoke to pt and states she has [facility] RN that comes twice per week for dressing changes through [facility]. Will fax orders when received from d/c MD to [facility] to resume services. States she lives alone and does well at home. Has RW for ambulating. Isidoro Donning RN CCM Case Mgmt phone [phone]  PCP- Andi Devon

## 2011-09-23 NOTE — Progress Notes (Signed)
   CARE MANAGEMENT NOTE 09/23/2011  Patient:  Kelly [name], Kelly [name]   Account Number:  [id number]  Date Initiated:  09/21/2011  Documentation initiated by:  Donn Pierini  Subjective/Objective Assessment:   Pt admitted with Anasarca / CHF     Action/Plan:   PTA pt lived at home alone, was independent with ADLs   Anticipated DC Date:  09/24/2011   Anticipated DC Plan:  HOME/SELF CARE      DC Planning Services  CM consult      [facility] Choice  HOME HEALTH  Resumption Of Svcs/PTA Provider   Choice offered to / List presented to:  C-1 Patient        HH arranged  HH-1 RN      [facility] agency  [facility]   Status of service:  In process, will continue to follow Medicare Important Message given?   (If response is "NO", the following Medicare IM given date fields will be blank) Date Medicare IM given:   Date Additional Medicare IM given:    Discharge Disposition:  HOME W HOME HEALTH SERVICES  Per UR Regulation:    Comments:  09/23/2011 1000 Spoke to pt and states she has [facility] RN that comes twice per week for dressing changes through [facility]. Will fax orders when received from d/c MD to [facility] to resume services. States she lives alone and does well at home. Has RW for ambulating. Isidoro Donning RN CCM Case Mgmt phone [phone]  PCP- Andi Devon

## 2011-09-24 ENCOUNTER — Encounter (HOSPITAL_COMMUNITY): Payer: Self-pay | Admitting: *Deleted

## 2011-09-24 NOTE — Telephone Encounter (Signed)
Pt aware. Jennifer Castillo, CMA  

## 2011-10-24 ENCOUNTER — Encounter (HOSPITAL_BASED_OUTPATIENT_CLINIC_OR_DEPARTMENT_OTHER): Payer: Medicare Other

## 2011-10-31 ENCOUNTER — Encounter (HOSPITAL_BASED_OUTPATIENT_CLINIC_OR_DEPARTMENT_OTHER): Payer: Medicare Other

## 2011-10-31 ENCOUNTER — Other Ambulatory Visit: Payer: Self-pay | Admitting: Nephrology

## 2011-11-05 ENCOUNTER — Ambulatory Visit
Admission: RE | Admit: 2011-11-05 | Discharge: 2011-11-05 | Disposition: A | Discharge status: Home / Self Care | Payer: Medicare Other | Source: Ambulatory Visit | Attending: Nephrology | Admitting: Nephrology

## 2011-11-29 ENCOUNTER — Encounter (HOSPITAL_BASED_OUTPATIENT_CLINIC_OR_DEPARTMENT_OTHER): Payer: Medicare Other | Attending: Plastic Surgery

## 2011-11-29 DIAGNOSIS — E119 Type 2 diabetes mellitus without complications: Secondary | ICD-10-CM | POA: Insufficient documentation

## 2011-11-29 DIAGNOSIS — E669 Obesity, unspecified: Secondary | ICD-10-CM | POA: Insufficient documentation

## 2011-11-29 DIAGNOSIS — L97109 Non-pressure chronic ulcer of unspecified thigh with unspecified severity: Secondary | ICD-10-CM | POA: Insufficient documentation

## 2011-11-29 DIAGNOSIS — I872 Venous insufficiency (chronic) (peripheral): Secondary | ICD-10-CM | POA: Insufficient documentation

## 2011-11-29 DIAGNOSIS — I890 Lymphedema, not elsewhere classified: Secondary | ICD-10-CM | POA: Insufficient documentation

## 2011-12-28 ENCOUNTER — Encounter (HOSPITAL_BASED_OUTPATIENT_CLINIC_OR_DEPARTMENT_OTHER): Payer: Medicare Other | Attending: General Surgery

## 2011-12-28 DIAGNOSIS — E119 Type 2 diabetes mellitus without complications: Secondary | ICD-10-CM | POA: Insufficient documentation

## 2011-12-28 DIAGNOSIS — L97809 Non-pressure chronic ulcer of other part of unspecified lower leg with unspecified severity: Secondary | ICD-10-CM | POA: Insufficient documentation

## 2011-12-28 DIAGNOSIS — I890 Lymphedema, not elsewhere classified: Secondary | ICD-10-CM | POA: Insufficient documentation

## 2012-01-04 ENCOUNTER — Encounter (HOSPITAL_COMMUNITY): Payer: Self-pay

## 2012-01-04 ENCOUNTER — Emergency Department (HOSPITAL_COMMUNITY): Payer: Medicare Other

## 2012-01-04 ENCOUNTER — Observation Stay (HOSPITAL_COMMUNITY)
Admission: EM | Admit: 2012-01-04 | Discharge: 2012-01-05 | Disposition: A | Discharge status: Home / Self Care | Payer: Medicare Other | Source: Ambulatory Visit | Attending: Cardiology | Admitting: Cardiology

## 2012-01-04 ENCOUNTER — Other Ambulatory Visit: Payer: Self-pay

## 2012-01-04 DIAGNOSIS — N189 Chronic kidney disease, unspecified: Secondary | ICD-10-CM | POA: Insufficient documentation

## 2012-01-04 DIAGNOSIS — Z6841 Body Mass Index (BMI) 40.0 and over, adult: Secondary | ICD-10-CM | POA: Insufficient documentation

## 2012-01-04 DIAGNOSIS — I129 Hypertensive chronic kidney disease with stage 1 through stage 4 chronic kidney disease, or unspecified chronic kidney disease: Secondary | ICD-10-CM | POA: Insufficient documentation

## 2012-01-04 DIAGNOSIS — I5032 Chronic diastolic (congestive) heart failure: Secondary | ICD-10-CM | POA: Insufficient documentation

## 2012-01-04 DIAGNOSIS — E662 Morbid (severe) obesity with alveolar hypoventilation: Secondary | ICD-10-CM | POA: Insufficient documentation

## 2012-01-04 DIAGNOSIS — E785 Hyperlipidemia, unspecified: Secondary | ICD-10-CM | POA: Insufficient documentation

## 2012-01-04 DIAGNOSIS — E119 Type 2 diabetes mellitus without complications: Secondary | ICD-10-CM | POA: Insufficient documentation

## 2012-01-04 DIAGNOSIS — I509 Heart failure, unspecified: Secondary | ICD-10-CM | POA: Insufficient documentation

## 2012-01-04 DIAGNOSIS — R0789 Other chest pain: Principal | ICD-10-CM | POA: Insufficient documentation

## 2012-01-04 LAB — PRO B NATRIURETIC PEPTIDE: Pro B Natriuretic peptide (BNP): 193.9 pg/mL — ABNORMAL HIGH (ref 0–125)

## 2012-01-04 LAB — BASIC METABOLIC PANEL
Chloride: 108 mEq/L (ref 96–112)
GFR calc Af Amer: 35 mL/min — ABNORMAL LOW (ref >90)
Potassium: 5.3 mEq/L — ABNORMAL HIGH (ref 3.5–5.1)

## 2012-01-04 LAB — CBC
HCT: 32.3 % — ABNORMAL LOW (ref 36.0–46.0)
Platelets: 266 10*3/uL (ref 150–400)
RDW: 16 % — ABNORMAL HIGH (ref 11.5–15.5)
WBC: 8.4 10*3/uL (ref 4.0–10.5)

## 2012-01-04 LAB — POCT I-STAT TROPONIN I: Troponin i, poc: 0 ng/mL (ref 0.00–0.08)

## 2012-01-04 MED ORDER — FUROSEMIDE 10 MG/ML IJ SOLN
80.0000 mg | Freq: Once | INTRAMUSCULAR | Status: AC
  Administered 2012-01-04: 80 mg via INTRAVENOUS
  Filled 2012-01-04: qty 8

## 2012-01-04 MED ORDER — MORPHINE SULFATE 4 MG/ML IJ SOLN
4.0000 mg | Freq: Once | INTRAMUSCULAR | Status: AC
  Administered 2012-01-04: 4 mg via INTRAVENOUS
  Filled 2012-01-04: qty 1

## 2012-01-04 NOTE — ED Provider Notes (Signed)
7:46 PM  Date: 01/04/2012  Rate:84  Rhythm: normal sinus rhythm  QRS Axis: right  Intervals: normal  ST/T Wave abnormalities: normal  Conduction Disutrbances:right bundle branch block  Narrative Interpretation: Abnormal EKG.  Old EKG Reviewed: unchanged    Carleene Cooper III, MD 01/04/12 [phone]

## 2012-01-04 NOTE — ED Notes (Signed)
Patient undressed and in a gown. Cardiac monitor, pulse oximetry, and blood pressure cuff on. 

## 2012-01-04 NOTE — ED Notes (Signed)
Patient presents from Dr. Allyne Gee office in [place] for shortness of breath and low oxygen saturation.  Patient also reporting "fluid build-up"  Patient has labored respirations with mild distress. Oxygen saturation 96% on room air.

## 2012-01-04 NOTE — ED Notes (Signed)
Attempted to call report to [phone], nurse states she is in a room and to call back in 10 min.

## 2012-01-04 NOTE — ED Provider Notes (Signed)
History     CSN: [id number]  Arrival date & time 01/04/12  1657   First MD Initiated Contact with Patient 01/04/12 1846      Chief Complaint  Patient presents with  . Shortness of Breath  . Chest Pain    (Consider location/radiation/quality/duration/timing/severity/associated sxs/prior treatment) HPI History provided by pt.   Pt c/o gradually worsening peripheral edema x 2 weeks.  Over the past 3-4 days, she has had exertional SOB as well.  When SOB is at its worst, she develops non-radiating tightness in the center of her chest that resolves w/ rest.  Associated w/ subjective fever.  Denies cough, orthopnea and PND.  Has h/o CHF.  She denies h/o MI.  No longer smokes cigarettes.  Also c/o constipation x 2 weeks.  Only hs a bowel movement when she takes a laxative and they are small.  Has "soreness" in her RUQ as well.  Denies N/V.  No h/o bowel obstruction and no past abd surgeries.  BG has been poorly controlled recently despite medication compliance.  Has gradually worsening sores associated w/ lymphedema on posterior legs.    Past Medical History  Diagnosis Date  . Hypertension   . Diabetes mellitus   . Hyperlipidemia   . Sleep apnea   . CHF (congestive heart failure)     Past Surgical History  Procedure Date  . Rotator cuff repair   . Knee arthroscopy   . Temple biopsy     Family History  Problem Relation Age of Onset  . Emphysema Father   . Asthma Brother   . Heart disease Mother     grandmother  . Stomach cancer Brother     History  Substance Use Topics  . Smoking status: Former Games developer  . Smokeless tobacco: Not on file  . Alcohol Use: No    OB History    Grav Para Term Preterm Abortions TAB SAB Ect Mult Living                  Review of Systems  All other systems reviewed and are negative.    Allergies  Penicillins  Home Medications   Current Outpatient Rx  Name Route Sig Dispense Refill  . ASPIRIN 325 MG PO TBEC Oral Take 325 mg by mouth  daily.    . ATORVASTATIN CALCIUM 40 MG PO TABS Oral Take 40 mg by mouth at bedtime.    . COLCHICINE 0.6 MG PO TABS Oral Take 0.6 mg by mouth 2 (two) times daily.    Marland Kitchen ESOMEPRAZOLE MAGNESIUM 40 MG PO CPDR Oral Take 40 mg by mouth daily before breakfast.    . FUROSEMIDE 80 MG PO TABS Oral Take 80 mg by mouth daily.    Marland Kitchen GABAPENTIN 300 MG PO CAPS Oral Take 300 mg by mouth 3 (three) times daily.    Marland Kitchen HYDRALAZINE HCL 25 MG PO TABS Oral Take 1 tablet (25 mg total) by mouth 3 (three) times daily. 60 tablet 2  . INSULIN GLARGINE 100 UNIT/ML [place] SOLN Subcutaneous Inject 55 Units into the skin at bedtime. As directed    . ISOSORBIDE MONONITRATE 10 MG PO TABS Oral Take 10 mg by mouth 2 (two) times daily.    Marland Kitchen LOSARTAN POTASSIUM 100 MG PO TABS Oral Take 100 mg by mouth daily.    Marland Kitchen METOPROLOL TARTRATE 25 MG PO TABS Oral Take 12.5 mg by mouth 2 (two) times daily.    . OXYCODONE-ACETAMINOPHEN 7.5-325 MG PO TABS Oral Take 2 tablets  by mouth every 6 (six) hours as needed. For pain      BP 188/69  Pulse 88  Temp(Src) 98.1 F (36.7 C) (Oral)  Resp 15  SpO2 100%  Physical Exam  Nursing note and vitals reviewed. Constitutional: She is oriented to person, place, and time. She appears well-developed and well-nourished. No distress.       Morbidly obese  HENT:  Head: Normocephalic and atraumatic.  Eyes:       Normal appearance  Neck: Normal range of motion.  Cardiovascular: Normal rate, regular rhythm and intact distal pulses.   Pulmonary/Chest: Effort normal and breath sounds normal. No respiratory distress. She has no rales. She exhibits no tenderness.       No pleuritic pain reported  Abdominal: Soft. Bowel sounds are normal. She exhibits no distension. There is no tenderness. There is no guarding.  Genitourinary:       Nml rectal tone and rectum non-tender.  No stool impaction.  Stool a normal color.    Musculoskeletal: Normal range of motion.       Lymph edema bilateral LE.  Left posterior thigh w/  two chronic ulcerations, each approx 6-7 in diameter w/out drainage, odor or surrounding erythema.    Neurological: She is alert and oriented to person, place, and time.  Skin: Skin is warm and dry. No rash noted.  Psychiatric: She has a normal mood and affect. Her behavior is normal.    ED Course  Procedures (including critical care time)  Labs Reviewed  PRO B NATRIURETIC PEPTIDE - Abnormal; Notable for the following:    Pro B Natriuretic peptide (BNP) 193.9 (*)    All other components within normal limits  CBC - Abnormal; Notable for the following:    RBC 3.83 (*)    Hemoglobin 10.0 (*)    HCT 32.3 (*)    RDW 16.0 (*)    All other components within normal limits  BASIC METABOLIC PANEL - Abnormal; Notable for the following:    Potassium 5.3 (*)    Glucose, Bld 149 (*)    BUN 55 (*)    Creatinine, Ser 1.71 (*)    GFR calc non Af Amer 30 (*)    GFR calc Af Amer 35 (*)    All other components within normal limits  POCT I-STAT TROPONIN I   Dg Chest 1 View  01/04/2012  *RADIOLOGY REPORT*  Clinical Data: Chest pain.  Lower extremity swelling.  CHEST - 1 VIEW  Comparison: CT chest 09/21/2011 and plain film chest 09/20/2011.  Findings: Heart size is mildly enlarged.  There is some peribronchial thickening.  No pneumothorax or pleural effusion.  IMPRESSION: Mild cardiomegaly and vascular congestion.  Original Report Authenticated By: Bernadene Bell. D'ALESSIO, M.D.     1. Chest pain       MDM  [phone] morbidly obese F w/ h/o CHF, HTN, diabetes and hyperlipidemia presents w/ c/o exertional CP/SOB for the past few days.  Has also had worse than baseline peripheral edema x 2wks despite medication compliance.  Was scheduled for outpatient stress echo last month but cancelled d/t death of her husband.  On exam, afebrile, no respiratory distress, lungs clear, bilateral LE lymph edema w/ chronic wounds left posterior thigh.  EKG non-ischemic, CXR shows vascular congestion and labs sig for neg  troponin and mild renal insufficiency.  Dr. Ignacia Palma has consulted Dr. Anselm Jungling for admission for r/o MI.          Otilio Miu, [place] 01/05/12 1153

## 2012-01-04 NOTE — ED Notes (Signed)
Multiple large stage III ulcers on back of bilateral Lower extremeties. Areas are open and draining clear liquid.

## 2012-01-05 LAB — CARDIAC PANEL(CRET KIN+CKTOT+MB+TROPI)
CK, MB: 1.7 ng/mL (ref 0.3–4.0)
CK, MB: 1.5 ng/mL (ref 0.3–4.0)
Relative Index: INVALID (ref 0.0–2.5)
Total CK: 97 U/L (ref 7–177)
Total CK: 71 U/L (ref 7–177)

## 2012-01-05 LAB — BASIC METABOLIC PANEL
BUN: 49 mg/dL — ABNORMAL HIGH (ref 6–23)
CO2: 22 mEq/L (ref 19–32)
Calcium: 8.6 mg/dL (ref 8.4–10.5)
Creatinine, Ser: 1.49 mg/dL — ABNORMAL HIGH (ref 0.50–1.10)
GFR calc non Af Amer: 36 mL/min — ABNORMAL LOW (ref >90)
Glucose, Bld: 160 mg/dL — ABNORMAL HIGH (ref 70–99)

## 2012-01-05 LAB — GLUCOSE, CAPILLARY

## 2012-01-05 MED ORDER — FUROSEMIDE 80 MG PO TABS
80.0000 mg | ORAL_TABLET | Freq: Every day | ORAL | Status: DC
  Administered 2012-01-05: 80 mg via ORAL
  Filled 2012-01-05: qty 1

## 2012-01-05 MED ORDER — ASPIRIN 325 MG PO TABS
325.0000 mg | ORAL_TABLET | Freq: Every day | ORAL | Status: DC
  Administered 2012-01-05: 325 mg via ORAL
  Filled 2012-01-05: qty 1

## 2012-01-05 MED ORDER — COLCHICINE 0.6 MG PO TABS
0.6000 mg | ORAL_TABLET | Freq: Two times a day (BID) | ORAL | Status: DC
  Administered 2012-01-05: 0.6 mg via ORAL
  Filled 2012-01-05 (×3): qty 1

## 2012-01-05 MED ORDER — HYDRALAZINE HCL 25 MG PO TABS
25.0000 mg | ORAL_TABLET | Freq: Three times a day (TID) | ORAL | Status: DC
  Administered 2012-01-05 (×2): 25 mg via ORAL
  Filled 2012-01-05 (×5): qty 1

## 2012-01-05 MED ORDER — GABAPENTIN 300 MG PO CAPS
300.0000 mg | ORAL_CAPSULE | Freq: Three times a day (TID) | ORAL | Status: DC
  Administered 2012-01-05: 300 mg via ORAL
  Filled 2012-01-05 (×5): qty 1

## 2012-01-05 MED ORDER — POTASSIUM CHLORIDE CRYS ER 20 MEQ PO TBCR: 20.0000 meq | EXTENDED_RELEASE_TABLET | Freq: Every day | ORAL | Status: DC

## 2012-01-05 MED ORDER — POTASSIUM CHLORIDE CRYS ER 20 MEQ PO TBCR
20.0000 meq | EXTENDED_RELEASE_TABLET | Freq: Every day | ORAL | Status: DC
  Administered 2012-01-05: 20 meq via ORAL
  Filled 2012-01-05: qty 1

## 2012-01-05 MED ORDER — OXYCODONE-ACETAMINOPHEN 5-325 MG PO TABS
2.0000 | ORAL_TABLET | Freq: Four times a day (QID) | ORAL | Status: DC | PRN
  Administered 2012-01-05: 2 via ORAL
  Filled 2012-01-05: qty 2

## 2012-01-05 MED ORDER — DEXLANSOPRAZOLE 60 MG PO CPDR: 60.0000 mg | DELAYED_RELEASE_CAPSULE | Freq: Every day | ORAL | Status: DC

## 2012-01-05 MED ORDER — INSULIN GLARGINE 100 UNIT/ML ~~LOC~~ SOLN: 55.0000 [IU] | Freq: Every day | SUBCUTANEOUS | Status: DC

## 2012-01-05 MED ORDER — METOPROLOL TARTRATE 12.5 MG HALF TABLET
12.5000 mg | ORAL_TABLET | Freq: Two times a day (BID) | ORAL | Status: DC
  Administered 2012-01-05 (×2): 12.5 mg via ORAL
  Filled 2012-01-05 (×4): qty 1

## 2012-01-05 MED ORDER — ATORVASTATIN CALCIUM 40 MG PO TABS
40.0000 mg | ORAL_TABLET | Freq: Every day | ORAL | Status: DC
  Filled 2012-01-05: qty 1

## 2012-01-05 MED ORDER — ENOXAPARIN SODIUM 40 MG/0.4ML ~~LOC~~ SOLN
40.0000 mg | Freq: Every day | SUBCUTANEOUS | Status: DC
  Filled 2012-01-05: qty 0.4

## 2012-01-05 NOTE — Progress Notes (Signed)
DC'd pt home with family.  RN reviewed discharge instructions and med rec with pt and family. RN removed IV with catheter intact.  Pt and family verbalized understanding of follow up appointments with MD. Lodema Pilot [facility]

## 2012-01-05 NOTE — ED Provider Notes (Signed)
Medical screening examination/treatment/procedure(s) were conducted as a shared visit with non-physician practitioner(s) and myself.  I personally evaluated the patient during the encounter Pt is a 66 year old woman who has noted increased swelling in her legs, shortness of breath, and some chest pain on exertion that eases with rest.  Exam shows her to be moderately obese.  Lungs clear, heart sounds normal, lymphedema in both lower legs.  EKG and enzymes negative.  Discussed with Yates Decamp, M.D., her cardiologist, who recommended admission to a telemetry floor, cycling her enzymes.  He will see her in the morning to decide if further testing will be needed.  Carleene Cooper III, MD 01/05/12 [phone]

## 2012-01-05 NOTE — Discharge Summary (Addendum)
Physician Discharge Summary  Patient ID: Kelly [name] MRN: [id number] DOB/AGE: May 05, 1946 66 y.o.  Admit date: 01/04/2012 Discharge date: 01/05/2012  Primary Discharge Diagnosis 1. Atypical chest pain 1. Chronic dyspnea Due to chronic disstolic heart failure, morbid obesity and deconditioning- Pickwickian syndrome.  Secondary Discharge Diagnosis  2. Fluid overload state. 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 3. Hypertension at goal.  4. Hyperlipidemia.   5.  Diabetes Mellitus II controlled.   6. Obesity morbid,  BMI 57 7. Chronic renal insufficiency.  Consults:   Hospital Course: Patient admitted overnight and remained stable without recurrence of chest pain and cardiac markers negative and no florid heart failure on physical exam. She needs out patient rehab and also wound care. My suspicion is she will need IP rehab and wound vac.    Discharge Exam: Blood pressure 113/45, pulse 82, temperature 97.9 F (36.6 C), temperature source Oral, resp. rate 18, weight 175.4 kg (386 lb 11 oz), SpO2 97.00%.    GENERAL: Moderately built and morbidly obese  body habitus, who is in no acute distress. Alert and oriented  x 3. Appears  stated age.    HEENT: normal limits. PERRLA, No JVD.  Short neck  CARDIAC EXAM: S1 S2 normal, no gallop or murmur.  Distant heart sounds.  CHEST EXAM: No tenderness of chest wall. Clear to percuss and auscultate. No rales or ronchi.    ABDOMEN: Soft non tender, obese with pannus. No organomegaly. BS positive in all 4 quadrants.  Limited exam.  EXTREMITY: Warm, non tender Pitting edema bilateral above knee,  ankle 2 plus.    There is a very large fatty infiltration in the back of her thigh very unusual location for fat deposit with almost an appearance of a double cheek on each of the thighs.  There is superficial ulcerations noted on both of these fatty infiltration left worse than the right.  There is  nonpitting swelling.  Mild  pitting edema is also evident on these physicians in the back of her thigh.  NEUROLOGIC EXAM: Grossly intact without any focal deficits. Alert O x 3.   Labs:   Lab Results  Component Value Date   WBC 8.4 01/04/2012   HGB 10.0* 01/04/2012   HCT 32.3* 01/04/2012   MCV 84.3 01/04/2012   PLT 266 01/04/2012    Lab 01/05/12 0650  NA 143  K 4.7  CL 109  CO2 22  BUN 49*  CREATININE 1.49*  CALCIUM 8.6  PROT --  BILITOT --  ALKPHOS --  ALT --  AST --  GLUCOSE 160*   Lab Results  Component Value Date   CKTOTAL 71 01/05/2012   CKMB 1.5 01/05/2012   TROPONINI <0.30 01/05/2012    Lipid Panel     Component Value Date/Time   CHOL  Value: 200        ATP III CLASSIFICATION:  <200     mg/dL   Desirable  409-811  mg/dL   Borderline High  >=914    mg/dL   High        7/82/9562 0611   TRIG 115 12/21/2010 0611   HDL 34* 12/21/2010 0611   CHOLHDL 5.9 12/21/2010 0611   VLDL 23 12/21/2010 0611   LDLCALC  Value: 143        Total Cholesterol/HDL:CHD Risk Coronary Heart Disease Risk Table  Men   Women  1/2 Average Risk   3.4   3.3  Average Risk       5.0   4.4  2 X Average Risk   9.6   7.1  3 X Average Risk  23.4   11.0        Use the calculated Patient Ratio above and the CHD Risk Table to determine the patient's CHD Risk.        ATP III CLASSIFICATION (LDL):  <100     mg/dL   Optimal  161-096  mg/dL   Near or Above                    Optimal  130-159  mg/dL   Borderline  045-409  mg/dL   High  >811     mg/dL   Very High* 05/10/7828 0611     Radiology: Dg Chest 1 View  01/04/2012  *RADIOLOGY REPORT*  Clinical Data: Chest pain.  Lower extremity swelling.  CHEST - 1 VIEW  Comparison: CT chest 09/21/2011 and plain film chest 09/20/2011.  Findings: Heart size is mildly enlarged.  There is some peribronchial thickening.  No pneumothorax or pleural effusion.  IMPRESSION: Mild cardiomegaly and vascular congestion.  Original Report Authenticated By: Bernadene Bell. D'ALESSIO,  M.D.    EKG:NSR, RBBB, Normal axis. No ischemia.  FOLLOW UP PLANS AND APPOINTMENTS Discharge Orders    Future Appointments: Provider: Department: Dept Phone: Center:   02/01/2012 8:00 AM [facility] [phone] [facility]     Medication List  As of 01/05/2012 10:53 AM   STOP taking these medications         aspirin 81 MG tablet         TAKE these medications         aspirin 325 MG EC tablet   Take 325 mg by mouth daily.      atorvastatin 40 MG tablet   Commonly known as: LIPITOR   Take 40 mg by mouth at bedtime.      colchicine 0.6 MG tablet   Take 0.6 mg by mouth 2 (two) times daily.      dexlansoprazole 60 MG capsule   Commonly known as: DEXILANT   Take 1 capsule (60 mg total) by mouth daily.      esomeprazole 40 MG capsule   Commonly known as: NEXIUM   Take 40 mg by mouth daily before breakfast.      furosemide 80 MG tablet   Commonly known as: LASIX   Take 80 mg by mouth daily.      gabapentin 300 MG capsule   Commonly known as: NEURONTIN   Take 300 mg by mouth 3 (three) times daily.      hydrALAZINE 25 MG tablet   Commonly known as: APRESOLINE   Take 1 tablet (25 mg total) by mouth 3 (three) times daily.      insulin glargine 100 UNIT/ML injection   Commonly known as: LANTUS   Inject 55 Units into the skin at bedtime. As directed      isosorbide mononitrate 10 MG tablet   Commonly known as: ISMO,MONOKET   Take 10 mg by mouth 2 (two) times daily.      losartan 100 MG tablet   Commonly known as: COZAAR   Take 100 mg by mouth daily.      metoprolol tartrate 25 MG tablet   Commonly known as: LOPRESSOR   Take 12.5 mg by mouth 2 (two)  times daily.      oxyCODONE-acetaminophen 7.5-325 MG per tablet   Commonly known as: PERCOCET   Take 2 tablets by mouth every 6 (six) hours as needed. For pain      potassium chloride SA 20 MEQ tablet   Commonly known as: K-DUR,KLOR-CON   Take 1 tablet (20 mEq total) by mouth daily.             Follow-up Information    Follow up with Pamella Pert, MD. Call in 2 days.   Contact information:   [address]  [place] [address] [phone]           Pamella Pert, MD 01/05/2012, 10:53 AM

## 2012-01-05 NOTE — H&P (Addendum)
Kelly [name] is an 66 y.o. female.   Chief Complaint: Chest pain HPI: Patient was seen by visiting home health care for chronic leg ulcers and told to go to the ED for further evaluation as she told the patient that the ulcers ongoing for long time needs to be dealt with in the IP setting. She also had chest pian that started weeks ago and on and off but not bothered by this. Sharp to heaviness without radiation and no relation to exertion, lasts seconds. No associated dyspnea, PND or orthopnea. But on recommendations from the visiting health provider.  Presently states she is doing well. Main concern is chronic leg ulceration. Also c/o chronic dyspnea. No associated chest pain and no recent change.   Past Medical History  Diagnosis Date  . Hypertension   . Diabetes mellitus   . Hyperlipidemia   . Sleep apnea   . CHF (congestive heart failure)     Past Surgical History  Procedure Date  . Rotator cuff repair   . Knee arthroscopy   . Temple biopsy     Family History  Problem Relation Age of Onset  . Emphysema Father   . Asthma Brother   . Heart disease Mother     grandmother  . Stomach cancer Brother    Social History:  reports that she has quit smoking. She does not have any smokeless tobacco history on file. She reports that she does not drink alcohol or use illicit drugs.  Allergies:  Allergies  Allergen Reactions  . Penicillins Hives    Medications Prior to Admission  Medication Sig Dispense Refill  . aspirin 325 MG EC tablet Take 325 mg by mouth daily.      Marland Kitchen atorvastatin (LIPITOR) 40 MG tablet Take 40 mg by mouth at bedtime.      . colchicine 0.6 MG tablet Take 0.6 mg by mouth 2 (two) times daily.      Marland Kitchen esomeprazole (NEXIUM) 40 MG capsule Take 40 mg by mouth daily before breakfast.      . furosemide (LASIX) 80 MG tablet Take 80 mg by mouth daily.      Marland Kitchen gabapentin (NEURONTIN) 300 MG capsule Take 300 mg by mouth 3 (three) times daily.      . hydrALAZINE  (APRESOLINE) 25 MG tablet Take 1 tablet (25 mg total) by mouth 3 (three) times daily.  60 tablet  2  . insulin glargine (LANTUS) 100 UNIT/ML injection Inject 55 Units into the skin at bedtime. As directed      . isosorbide mononitrate (ISMO,MONOKET) 10 MG tablet Take 10 mg by mouth 2 (two) times daily.      Marland Kitchen losartan (COZAAR) 100 MG tablet Take 100 mg by mouth daily.      . metoprolol tartrate (LOPRESSOR) 25 MG tablet Take 12.5 mg by mouth 2 (two) times daily.      Marland Kitchen oxyCODONE-acetaminophen (PERCOCET) 7.5-325 MG per tablet Take 2 tablets by mouth every 6 (six) hours as needed. For pain        Results for orders placed during the hospital encounter of 01/04/12 (from the past 48 hour(s))  PRO B NATRIURETIC PEPTIDE     Status: Abnormal   Collection Time   01/04/12  5:14 PM      Component Value Range Comment   Pro B Natriuretic peptide (BNP) 193.9 (*) 0 - 125 (pg/mL)   CBC     Status: Abnormal   Collection Time   01/04/12  5:14 PM  Component Value Range Comment   WBC 8.4  4.0 - 10.5 (K/uL)    RBC 3.83 (*) 3.87 - 5.11 (MIL/uL)    Hemoglobin 10.0 (*) 12.0 - 15.0 (g/dL)    HCT 16.1 (*) 09.6 - 46.0 (%)    MCV 84.3  78.0 - 100.0 (fL)    MCH 26.1  26.0 - 34.0 (pg)    MCHC 31.0  30.0 - 36.0 (g/dL)    RDW 04.5 (*) 40.9 - 15.5 (%)    Platelets 266  150 - 400 (K/uL)   BASIC METABOLIC PANEL     Status: Abnormal   Collection Time   01/04/12  5:14 PM      Component Value Range Comment   Sodium 141  135 - 145 (mEq/L)    Potassium 5.3 (*) 3.5 - 5.1 (mEq/L)    Chloride 108  96 - 112 (mEq/L)    CO2 21  19 - 32 (mEq/L)    Glucose, Bld 149 (*) 70 - 99 (mg/dL)    BUN 55 (*) 6 - 23 (mg/dL)    Creatinine, Ser 8.11 (*) 0.50 - 1.10 (mg/dL)    Calcium 9.2  8.4 - 10.5 (mg/dL)    GFR calc non Af Amer 30 (*) >90 (mL/min)    GFR calc Af Amer 35 (*) >90 (mL/min)   POCT I-STAT TROPONIN I     Status: Normal   Collection Time   01/04/12  5:42 PM      Component Value Range Comment   Troponin i, poc 0.00   0.00 - 0.08 (ng/mL)    Comment 3            POCT I-STAT TROPONIN I     Status: Normal   Collection Time   01/04/12 10:34 PM      Component Value Range Comment   Troponin i, poc 0.01  0.00 - 0.08 (ng/mL)    Comment 3            CARDIAC PANEL(CRET KIN+CKTOT+MB+TROPI)     Status: Normal   Collection Time   01/05/12 12:12 AM      Component Value Range Comment   Total CK 97  7 - 177 (U/L)    CK, MB 1.7  0.3 - 4.0 (ng/mL)    Troponin I <0.30  <0.30 (ng/mL)    Relative Index RELATIVE INDEX IS INVALID  0.0 - 2.5    MRSA PCR SCREENING     Status: Abnormal   Collection Time   01/05/12  2:04 AM      Component Value Range Comment   MRSA by PCR POSITIVE (*) NEGATIVE    BASIC METABOLIC PANEL     Status: Abnormal   Collection Time   01/05/12  6:50 AM      Component Value Range Comment   Sodium 143  135 - 145 (mEq/L)    Potassium 4.7  3.5 - 5.1 (mEq/L)    Chloride 109  96 - 112 (mEq/L)    CO2 22  19 - 32 (mEq/L)    Glucose, Bld 160 (*) 70 - 99 (mg/dL)    BUN 49 (*) 6 - 23 (mg/dL)    Creatinine, Ser 9.14 (*) 0.50 - 1.10 (mg/dL)    Calcium 8.6  8.4 - 10.5 (mg/dL)    GFR calc non Af Amer 36 (*) >90 (mL/min)    GFR calc Af Amer 42 (*) >90 (mL/min)   GLUCOSE, CAPILLARY     Status: Abnormal   Collection Time  01/05/12  6:56 AM      Component Value Range Comment   Glucose-Capillary 155 (*) 70 - 99 (mg/dL)    Dg Chest 1 View  1/61/0960  *RADIOLOGY REPORT*  Clinical Data: Chest pain.  Lower extremity swelling.  CHEST - 1 VIEW  Comparison: CT chest 09/21/2011 and plain film chest 09/20/2011.  Findings: Heart size is mildly enlarged.  There is some peribronchial thickening.  No pneumothorax or pleural effusion.  IMPRESSION: Mild cardiomegaly and vascular congestion.  Original Report Authenticated By: Bernadene Bell. D'ALESSIO, M.D.   ROS: Arthritis -yes, life-style limiting -yes;  Cramping of the legs and feet at night or with activity -yes; Diabetes -yes (type II), Controlled -yes;  Hypothyroidism -no;   Previous Stroke -no, Previous GI Bleed -no ,  Recent weight change -yes, Symptoms to suggest sleep apnea like loud snoring, daytime sleepiness -yes, Other systems negative.      Blood pressure 113/45, pulse 82, temperature 97.9 F (36.6 C), temperature source Oral, resp. rate 18, weight 175.4 kg (386 lb 11 oz), SpO2 97.00%.  GENERAL: Moderately built and morbidly obese  body habitus, who is in no acute distress. Alert and oriented  x 3. Appears  stated age.    HEENT: normal limits. PERRLA, No JVD.  Short neck  CARDIAC EXAM: S1 S2 normal, no gallop or murmur.  Distant heart sounds.  CHEST EXAM: No tenderness of chest wall. Clear to percuss and auscultate. No rales or ronchi.    ABDOMEN: Soft non tender, obese with pannus. No organomegaly. BS positive in all 4 quadrants.  Limited exam.  EXTREMITY: Warm, non tender Pitting edema bilateral above knee,  ankle 2 plus.    There is a very large fatty infiltration in the back of her thigh very unusual location for fat deposit with almost an appearance of a double cheek on each of the thighs.  There is superficial ulcerations noted on both of these fatty infiltration left worse than the right.  There is nonpitting swelling.  Mild  pitting edema is also evident on these physicians in the back of her thigh.  NEUROLOGIC EXAM: Grossly intact without any focal deficits. Alert O x 3.     Vascular  examination: Limited vascular examination.  Carotids are normal without any bruit.  Femoral pulses and popliteal pulses could not be felt.  Pedal pulses are very faint and difficult to feel.  This is due to body habitus.  Cardiac Panel (last 3 results)  Basename 01/05/12 0856 01/05/12 0012  CKTOTAL 71 97  CKMB 1.5 1.7  TROPONINI <0.30 <0.30  RELINDX RELATIVE INDEX IS INVALID RELATIVE INDEX IS INVALID    Assessment/Plan  1. Atypical chest pain. Presently asymptomatic and sleeping flat in bed without any complaints. Main complaint is leg edema and chronic  leg ulceration. EKG: NSR, RBBB. No ischemia.  2.  Shortness of breath/Dyspnea on exertion. Due to chronic disstolic heart failure, morbid obesity and deconditioning- Pickwickian syndrome. Anginal equivalent  probably not, but cannot be excluded.  Echo 09/21/11: Normal LVEF 60%. Mild diastolic dyfunction. IVC dilated and suggests right heart elevated pressure and RV strain probably due to Pickwickian syndrome. Threre was Trace TR. 3. Fluid overload state. 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 4. Hypertension at goal.   5. Hyperlipidemia.   6.  Diabetes Mellitus II controlled.  Labs done on 10/04/2011 revealed Hb10.9/HCT 35.3 with microcytic indices.  BUN 33, her creatinine 1.71 with eGFR 35 mL.   7.  Obesity morbid, BMI 57. Has obstructive sleep apnea symptoms. Not using CPAP last 5 months.  Rec: No further specific recommendation from cardiac standpoint. OK to discharge patient home. She needs to continue to go to wound care center. I will follow up as OP.   Pamella Pert, MD 01/05/2012, 10:03 AM

## 2012-01-05 NOTE — Discharge Instructions (Signed)
Chest Pain (Nonspecific) It is often hard to give a specific diagnosis for the cause of chest pain. There is always a chance that your pain could be related to something serious, such as a heart attack or a blood clot in the lungs. You need to follow up with your caregiver for further evaluation. CAUSES   Heartburn.   Pneumonia or bronchitis.   Anxiety or stress.   Inflammation around your heart (pericarditis) or lung (pleuritis or pleurisy).   A blood clot in the lung.   A collapsed lung (pneumothorax). It can develop suddenly on its own (spontaneous pneumothorax) or from injury (trauma) to the chest.   Shingles infection (herpes zoster virus).  The chest wall is composed of bones, muscles, and cartilage. Any of these can be the source of the pain.  The bones can be bruised by injury.   The muscles or cartilage can be strained by coughing or overwork.   The cartilage can be affected by inflammation and become sore (costochondritis).  DIAGNOSIS  Lab tests or other studies, such as X-rays, electrocardiography, stress testing, or cardiac imaging, may be needed to find the cause of your pain.  TREATMENT   Treatment depends on what may be causing your chest pain. Treatment may include:   Acid blockers for heartburn.   Anti-inflammatory medicine.   Pain medicine for inflammatory conditions.   Antibiotics if an infection is present.   You may be advised to change lifestyle habits. This includes stopping smoking and avoiding alcohol, caffeine, and chocolate.   You may be advised to keep your head raised (elevated) when sleeping. This reduces the chance of acid going backward from your stomach into your esophagus.   Most of the time, nonspecific chest pain will improve within 2 to 3 days with rest and mild pain medicine.  HOME CARE INSTRUCTIONS   If antibiotics were prescribed, take your antibiotics as directed. Finish them even if you start to feel better.   For the next few  days, avoid physical activities that bring on chest pain. Continue physical activities as directed.   Do not smoke.   Avoid drinking alcohol.   Only take over-the-counter or prescription medicine for pain, discomfort, or fever as directed by your caregiver.   Follow your caregiver's suggestions for further testing if your chest pain does not go away.   Keep any follow-up appointments you made. If you do not go to an appointment, you could develop lasting (chronic) problems with pain. If there is any problem keeping an appointment, you must call to reschedule.  SEEK MEDICAL CARE IF:   You think you are having problems from the medicine you are taking. Read your medicine instructions carefully.   Your chest pain does not go away, even after treatment.   You develop a rash with blisters on your chest.  SEEK IMMEDIATE MEDICAL CARE IF:   You have increased chest pain or pain that spreads to your arm, neck, jaw, back, or abdomen.   You develop shortness of breath, an increasing cough, or you are coughing up blood.   You have severe back or abdominal pain, feel nauseous, or vomit.   You develop severe weakness, fainting, or chills.   You have a fever.  THIS IS AN EMERGENCY. Do not wait to see if the pain will go away. Get medical help at once. Call your local emergency services (911 in [place] Do not drive yourself to the hospital. MAKE SURE YOU:   Understand these instructions.     Will watch your condition.   Will get help right away if you are not doing well or get worse.  Document Released: 05/23/2005 Document Revised: 08/02/2011 Document Reviewed: 03/18/2008 [employer] Patient Information 2012 [employer]. 

## 2012-01-07 NOTE — Progress Notes (Signed)
UR Complete.    Brnadon Eoff Wise Masami Plata, RN, BSN Phone #[phone] 

## 2012-01-18 NOTE — Progress Notes (Signed)
[facility]  NAME:  Kelly [name], Kelly [name]                    ACCOUNT NO.:  MEDICAL RECORD NO.:  [id number]      DATE OF BIRTH:  May 23, 1946  PHYSICIAN:  Ardath Sax, M.D.           VISIT DATE:                                  OFFICE VISIT   Kelly [name] is a 66 year old, morbidly obese, diabetic woman who has gotten much worse lately with her bilateral lower extremity lymphedema. She has been using lymphedema pumps and we have been seeing her for multiple debridements and we have been putting alginate on these wounds. She really has most of this edema with the ulcer on her left thigh, the ulcer is about 4 cm in diameter, and I feel that we have to change to a wound VAC as this is not getting any better with our attempts with alginate and DuoDerm.  She has been evaluated also by a plastic surgeon to do resection of these huge size with the skin hanging down below her knees, but they feel that this would be too bigger risk at least at this point.  She is attempting to lose weight and I think is marginally successful.  I feel that if we use a wound VAC and continue her dieting and weight loss, we would have a good chance to get this wound of her left thigh either heels or more manageable.  So, we are applying for application of wound VAC as part of her therapy.     Ardath Sax, M.D.     PP/MEDQ  D:  01/16/2012  T:  01/16/2012  Job:  [id number]

## 2012-02-01 ENCOUNTER — Encounter (HOSPITAL_BASED_OUTPATIENT_CLINIC_OR_DEPARTMENT_OTHER): Payer: Medicare Other | Attending: General Surgery

## 2012-02-01 DIAGNOSIS — I890 Lymphedema, not elsewhere classified: Secondary | ICD-10-CM | POA: Insufficient documentation

## 2012-02-01 DIAGNOSIS — E119 Type 2 diabetes mellitus without complications: Secondary | ICD-10-CM | POA: Insufficient documentation

## 2012-02-01 DIAGNOSIS — L97809 Non-pressure chronic ulcer of other part of unspecified lower leg with unspecified severity: Secondary | ICD-10-CM | POA: Insufficient documentation

## 2012-02-29 ENCOUNTER — Encounter (HOSPITAL_BASED_OUTPATIENT_CLINIC_OR_DEPARTMENT_OTHER): Payer: Medicare Other | Attending: General Surgery

## 2012-02-29 DIAGNOSIS — I10 Essential (primary) hypertension: Secondary | ICD-10-CM | POA: Insufficient documentation

## 2012-02-29 DIAGNOSIS — I872 Venous insufficiency (chronic) (peripheral): Secondary | ICD-10-CM | POA: Insufficient documentation

## 2012-02-29 DIAGNOSIS — E119 Type 2 diabetes mellitus without complications: Secondary | ICD-10-CM | POA: Insufficient documentation

## 2012-02-29 DIAGNOSIS — Z79899 Other long term (current) drug therapy: Secondary | ICD-10-CM | POA: Insufficient documentation

## 2012-02-29 DIAGNOSIS — E669 Obesity, unspecified: Secondary | ICD-10-CM | POA: Insufficient documentation

## 2012-02-29 DIAGNOSIS — L97809 Non-pressure chronic ulcer of other part of unspecified lower leg with unspecified severity: Secondary | ICD-10-CM | POA: Insufficient documentation

## 2012-02-29 DIAGNOSIS — I890 Lymphedema, not elsewhere classified: Secondary | ICD-10-CM | POA: Insufficient documentation

## 2012-02-29 DIAGNOSIS — E785 Hyperlipidemia, unspecified: Secondary | ICD-10-CM | POA: Insufficient documentation

## 2012-02-29 DIAGNOSIS — Z794 Long term (current) use of insulin: Secondary | ICD-10-CM | POA: Insufficient documentation

## 2012-03-05 NOTE — Progress Notes (Signed)
[facility]  NAME:  Kelly [name], Kelly [name]               ACCOUNT NO.:  [id number]  MEDICAL RECORD NO.:  [id number]     DATE OF BIRTH:  01/22/1944  PHYSICIAN:  Ardath Sax, M.D.           VISIT DATE:                                  OFFICE VISIT   Dr. Earle Gell  I am in hopes that he will see her in consultation and I want him to receive this letter.  Dear Judie Petit,  Kelly [name] is a patient whom I have been following at the [facility] for many months.  She is a 66 year old African- American female who has type 2 diabetes, hypertension, hyperlipidemia, chronic venous insufficiency.  She has marked obesity.  She weighs 370 pounds.  She is on many medicines including aspirin, gabapentin, isosorbide, Lantus insulin, Lasix, Lipitor, losartan, metoprolol, OxyContin, zolpidem.  I have been treating her with many methods of treatment including wound VACs, which really I cannot stand her lymphedematous thighs where this skin and panniculus of each thigh just hangs down below her knees and has these superficial ulcerations.  I have attempted to have many people see her, including Dr. Mardene Speak and everybody has said that they might operate on her if she would lose about 100 pounds.  I have been attempting to do this without much success.  She has probably lost 10 or 15 pounds since I had followed her.  She gets very emotional about her situation and says that she just cannot live this way with these bags of edematous skin and fat hanging down her legs and her trying to live this way is no longer tolerated by her.  I am in hopes that she will take a look at her situation and see if there is any possibility that these areas could be resected and somehow heal.  I know it will be a chore, but I just wanted your opinion.  Thanks for seeing her.     Ardath Sax, M.D.     PP/MEDQ  D:  02/29/2012  T:  02/29/2012  Job:  [id number]

## 2012-03-21 ENCOUNTER — Emergency Department (HOSPITAL_COMMUNITY): Payer: Medicare Other

## 2012-03-21 ENCOUNTER — Encounter (HOSPITAL_COMMUNITY): Payer: Self-pay | Admitting: Emergency Medicine

## 2012-03-21 ENCOUNTER — Observation Stay (HOSPITAL_COMMUNITY)
Admission: EM | Admit: 2012-03-21 | Discharge: 2012-03-22 | Disposition: A | Discharge status: Home / Self Care | Payer: Medicare Other | Attending: Internal Medicine | Admitting: Internal Medicine

## 2012-03-21 DIAGNOSIS — I2789 Other specified pulmonary heart diseases: Secondary | ICD-10-CM | POA: Insufficient documentation

## 2012-03-21 DIAGNOSIS — E1149 Type 2 diabetes mellitus with other diabetic neurological complication: Secondary | ICD-10-CM | POA: Diagnosis present

## 2012-03-21 DIAGNOSIS — I509 Heart failure, unspecified: Secondary | ICD-10-CM

## 2012-03-21 DIAGNOSIS — E785 Hyperlipidemia, unspecified: Secondary | ICD-10-CM | POA: Insufficient documentation

## 2012-03-21 DIAGNOSIS — G4730 Sleep apnea, unspecified: Secondary | ICD-10-CM

## 2012-03-21 DIAGNOSIS — N189 Chronic kidney disease, unspecified: Secondary | ICD-10-CM | POA: Insufficient documentation

## 2012-03-21 DIAGNOSIS — E662 Morbid (severe) obesity with alveolar hypoventilation: Secondary | ICD-10-CM | POA: Insufficient documentation

## 2012-03-21 DIAGNOSIS — E119 Type 2 diabetes mellitus without complications: Secondary | ICD-10-CM | POA: Insufficient documentation

## 2012-03-21 DIAGNOSIS — M79609 Pain in unspecified limb: Secondary | ICD-10-CM | POA: Insufficient documentation

## 2012-03-21 DIAGNOSIS — I10 Essential (primary) hypertension: Secondary | ICD-10-CM

## 2012-03-21 DIAGNOSIS — R0602 Shortness of breath: Secondary | ICD-10-CM | POA: Insufficient documentation

## 2012-03-21 DIAGNOSIS — I129 Hypertensive chronic kidney disease with stage 1 through stage 4 chronic kidney disease, or unspecified chronic kidney disease: Secondary | ICD-10-CM | POA: Insufficient documentation

## 2012-03-21 DIAGNOSIS — R601 Generalized edema: Secondary | ICD-10-CM

## 2012-03-21 DIAGNOSIS — R079 Chest pain, unspecified: Principal | ICD-10-CM | POA: Insufficient documentation

## 2012-03-21 HISTORY — DX: Anemia, unspecified: D64.9

## 2012-03-21 HISTORY — DX: Gastro-esophageal reflux disease without esophagitis: K21.9

## 2012-03-21 LAB — POCT I-STAT, CHEM 8
BUN: 43 mg/dL — ABNORMAL HIGH (ref 6–23)
Calcium, Ion: 1.2 mmol/L (ref 1.13–1.30)
Chloride: 112 mEq/L (ref 96–112)
Creatinine, Ser: 1.9 mg/dL — ABNORMAL HIGH (ref 0.50–1.10)
Glucose, Bld: 74 mg/dL (ref 70–99)
TCO2: 20 mmol/L (ref 0–100)

## 2012-03-21 LAB — TROPONIN I: Troponin I: <0.3 ng/mL (ref <0.30)

## 2012-03-21 MED ORDER — OXYCODONE-ACETAMINOPHEN 5-325 MG PO TABS
2.0000 | ORAL_TABLET | Freq: Once | ORAL | Status: AC
  Administered 2012-03-21: 2 via ORAL
  Filled 2012-03-21: qty 2

## 2012-03-21 NOTE — ED Notes (Signed)
Per EMS - pt c/o chest tightness 5/10, sob nausea and bilateral leg pain. EMS admin 324 ASA and 2 Nitro. Pt maintained BP and reported that she got some relief but wasn't able to put a number on it.

## 2012-03-21 NOTE — H&P (Signed)
Kelly [name] is an 66 y.o. female.   Chief Complaint: Chest pain HPI: A 66 year old morbidly obese female with history of congestive heart failure mainly right-sided failure including obesity hypoventilation syndrome was seen today by her cardiologist Dr. Yates Decamp for chest pain. She was being prepped for a stress test. The could not do the test in the office and she was told she will have it done in the hospital. Patient went home and started feeling chest pain headaches and generalized with the aches as well as shortness of breath. She couldn't stand up she had to lay down. Her friend noted it and called EMS. She has since been stable since arrival in the ED. Chest pain was centrally located described as 4/10 radiating mainly to was her left arm. She felt like every part of her body was aching thereafter. She denied cough no fever no dizziness. Patient has well known lower extremety. edema with home health coming out. She has a VAC for her fluids draining. He also has chronic stasis dermatitis.  Past Medical History  Diagnosis Date  . Hypertension   . Diabetes mellitus   . Hyperlipidemia   . Sleep apnea   . CHF (congestive heart failure)     Past Surgical History  Procedure Date  . Rotator cuff repair   . Knee arthroscopy   . Temple biopsy     Family History  Problem Relation Age of Onset  . Emphysema Father   . Asthma Brother   . Heart disease Mother     grandmother  . Stomach cancer Brother    Social History:  reports that she has quit smoking. She does not have any smokeless tobacco history on file. She reports that she does not drink alcohol or use illicit drugs.  Allergies:  Allergies  Allergen Reactions  . Penicillins Hives     (Not in a hospital admission)  Results for orders placed during the hospital encounter of 03/21/12 (from the past 48 hour(s))  PRO B NATRIURETIC PEPTIDE     Status: Abnormal   Collection Time   03/21/12  9:16 PM      Component Value Range  Comment   Pro B Natriuretic peptide (BNP) 407.5 (*) 0 - 125 pg/mL   TROPONIN I     Status: Normal   Collection Time   03/21/12  9:16 PM      Component Value Range Comment   Troponin I <0.30  <0.30 ng/mL   POCT I-STAT, CHEM 8     Status: Abnormal   Collection Time   03/21/12  9:37 PM      Component Value Range Comment   Sodium 142  135 - 145 mEq/L    Potassium 4.7  3.5 - 5.1 mEq/L    Chloride 112  96 - 112 mEq/L    BUN 43 (*) 6 - 23 mg/dL    Creatinine, Ser 1.61 (*) 0.50 - 1.10 mg/dL    Glucose, Bld 74  70 - 99 mg/dL    Calcium, Ion 0.96  0.45 - 1.30 mmol/L    TCO2 20  0 - 100 mmol/L    Hemoglobin 9.9 (*) 12.0 - 15.0 g/dL    HCT 40.9 (*) 81.1 - 46.0 %    Dg Chest 2 View  03/21/2012  *RADIOLOGY REPORT*  Clinical Data: 66 year old female with chest pain.  CHEST - 2 VIEW  Comparison: 01/04/2012 and prior chest radiographs  Findings: Cardiomegaly and pulmonary vascular congestion identified. Mild interstitial opacities are  identified and may represent interstitial edema. There is no evidence of focal airspace disease, suspicious pulmonary nodule/mass, pleural effusion, or pneumothorax. No acute bony abnormalities are identified.  IMPRESSION: Cardiomegaly with pulmonary vascular congestion and possible mild interstitial pulmonary edema.  Original Report Authenticated By: Rosendo Gros, M.D.    Review of Systems  HENT: Positive for ear pain.   Eyes: Negative.   Respiratory: Positive for shortness of breath. Negative for cough, hemoptysis, sputum production and wheezing.   Cardiovascular: Positive for chest pain, orthopnea and leg swelling. Negative for palpitations.  Gastrointestinal: Negative.   Genitourinary: Negative.   Musculoskeletal: Positive for myalgias, back pain and joint pain.  Skin: Negative.   Neurological: Positive for weakness and headaches.  Endo/Heme/Allergies: Negative.   Psychiatric/Behavioral: Negative.     Blood pressure 126/49, pulse 89, temperature 97.8 F (36.6  C), temperature source Oral, resp. rate 18, SpO2 100.00%. Physical Exam  Constitutional: She is oriented to person, place, and time. She appears well-nourished.       Morbidly obese  HENT:  Head: Normocephalic and atraumatic.  Right Ear: External ear normal.  Left Ear: External ear normal.  Nose: Nose normal.  Mouth/Throat: Oropharynx is clear and moist.  Eyes: Conjunctivae and EOM are normal. Pupils are equal, round, and reactive to light.  Neck: Normal range of motion. Neck supple.  Cardiovascular: Normal rate, regular rhythm, normal heart sounds and intact distal pulses.   Respiratory: No respiratory distress. She has no wheezes. She has rales. She exhibits no tenderness.  GI: Soft. Bowel sounds are normal.  Musculoskeletal: She exhibits edema. She exhibits no tenderness.  Neurological: She is alert and oriented to person, place, and time. She has normal reflexes.  Skin: Skin is warm and dry. No rash noted. There is erythema. No pallor.  Psychiatric: She has a normal mood and affect. Her behavior is normal. Judgment and thought content normal.     Assessment/Plan A 66 year old morbidly obese woman presenting with chest pain. Patient has multiple risk factors for coronary artery disease. Her symptoms may be angina at the present versus CHF exacerbation based on the findings on her chest x-ray.  Plan #1 chest pain: Patient will be admitted to telemetry. Will check serial cardiac enzymes. Repeat EKG with chest pain. Given her nitroglycerin as needed. We'll consult her cardiologist for possible stress test in the hospital.  #2 diabetes: Continue his home medications and add sliding scale insulin.  #3 hypertension: Blood pressure seems controlled and also we will continue his home medications.  #4 CHF: We'll continue his home medicines and change her diuretics to IV.  #5 chronic lower extremity edema with wounds: Continue with her vac draining in the hospital and get wound care  consult  #6 obesity hypoventilation syndrome: The patient will be on oxygen and telemetry monitoring.  #7 morbid obesity: Will offer geriatric bed if needed.  Astin Sayre,LAWAL 03/21/2012, 11:53 PM

## 2012-03-21 NOTE — ED Provider Notes (Signed)
History     CSN: [id number]  Arrival date & time 03/21/12  2021   First MD Initiated Contact with Patient 03/21/12 2031      Chief Complaint  Patient presents with  . Chest Pain     The history is limited by the condition of the patient.   Per EMS - pt c/o chest tightness 5/10, sob nausea and bilateral leg pain. EMS admin 324 ASA and 2 Nitro. Pt maintained BP and reported that she got some relief but wasn't able to put a number on it  Past Medical History  Diagnosis Date  . Hypertension   . Diabetes mellitus   . Hyperlipidemia   . Sleep apnea   . CHF (congestive heart failure)   . Headache   . GERD (gastroesophageal reflux disease)   . Anemia     Past Surgical History  Procedure Date  . Rotator cuff repair   . Knee arthroscopy   . Temple biopsy     Family History  Problem Relation Age of Onset  . Emphysema Father   . Asthma Brother   . Heart disease Mother     grandmother  . Stomach cancer Brother     History  Substance Use Topics  . Smoking status: Former Games developer  . Smokeless tobacco: Not on file  . Alcohol Use: No    OB History    Grav Para Term Preterm Abortions TAB SAB Ect Mult Living                  Review of Systems  Allergies  Penicillins  Home Medications   Current Outpatient Rx  Name Route Sig Dispense Refill  . ALLOPURINOL 100 MG PO TABS Oral Take 100 mg by mouth 2 (two) times daily.    . ASPIRIN 325 MG PO TBEC Oral Take 325 mg by mouth daily.    . ATORVASTATIN CALCIUM 40 MG PO TABS Oral Take 40 mg by mouth at bedtime.    . COLCHICINE 0.6 MG PO TABS Oral Take 0.6 mg by mouth 2 (two) times daily.    Marland Kitchen ESOMEPRAZOLE MAGNESIUM 40 MG PO CPDR Oral Take 40 mg by mouth daily before breakfast.    . FUROSEMIDE 80 MG PO TABS Oral Take 80 mg by mouth daily.    Marland Kitchen GABAPENTIN 300 MG PO CAPS Oral Take 300 mg by mouth 3 (three) times daily.    Marland Kitchen HYDRALAZINE HCL 25 MG PO TABS Oral Take 1 tablet (25 mg total) by mouth 3 (three) times daily. 60 tablet  2  . INSULIN GLARGINE 100 UNIT/ML [place] SOLN Subcutaneous Inject 55 Units into the skin at bedtime. As directed    . ISOSORBIDE MONONITRATE 10 MG PO TABS Oral Take 10 mg by mouth 2 (two) times daily.    Marland Kitchen LEVOFLOXACIN 500 MG PO TABS Oral Take 500 mg by mouth daily.    Marland Kitchen LOSARTAN POTASSIUM 100 MG PO TABS Oral Take 100 mg by mouth daily.    Marland Kitchen METOPROLOL TARTRATE 25 MG PO TABS Oral Take 12.5 mg by mouth 2 (two) times daily.    . OXYCODONE-ACETAMINOPHEN 7.5-325 MG PO TABS Oral Take 2 tablets by mouth every 6 (six) hours as needed. For pain      BP 118/53  Pulse 72  Temp 97.7 F (36.5 C) (Oral)  Resp 20  Ht 5\' 6"  (1.676 m)  Wt 366 lb 8.1 oz (166.245 kg)  BMI 59.16 kg/m2  SpO2 99%  Physical Exam  Nursing note  and vitals reviewed. Constitutional: She is oriented to person, place, and time. She appears well-developed. No distress.  HENT:  Head: Normocephalic and atraumatic.  Eyes: Pupils are equal, round, and reactive to light.  Neck: Normal range of motion.  Cardiovascular: Normal rate and intact distal pulses.         INTERPRETATION SINUS RHYTHM ~ normal P axis, V-rate 50- 99 RIGHT BUNDLE BRANCH BLOCK ~ QRSd>120, terminal axis(90,270) Abnormal ECG No significant change from previous tracing  Pulmonary/Chest: No respiratory distress.  Abdominal: Normal appearance. She exhibits no distension.  Musculoskeletal: Normal range of motion.  Neurological: She is alert and oriented to person, place, and time. No cranial nerve deficit.  Skin: Skin is warm and dry. No rash noted.  Psychiatric: She has a normal mood and affect. Her behavior is normal.    ED Course  Procedures (including critical care time)  Labs Reviewed  PRO B NATRIURETIC PEPTIDE - Abnormal; Notable for the following:    Pro B Natriuretic peptide (BNP) 407.5 (*)     All other components within normal limits  POCT I-STAT, CHEM 8 - Abnormal; Notable for the following:    BUN 43 (*)     Creatinine, Ser 1.90 (*)      Hemoglobin 9.9 (*)     HCT 29.0 (*)     All other components within normal limits  HEMOGLOBIN A1C - Abnormal; Notable for the following:    Hemoglobin A1C 5.9 (*)     Mean Plasma Glucose 123 (*)     All other components within normal limits  CBC - Abnormal; Notable for the following:    RBC 3.37 (*)     Hemoglobin 8.9 (*)     HCT 27.9 (*)     RDW 16.1 (*)     All other components within normal limits  COMPREHENSIVE METABOLIC PANEL - Abnormal; Notable for the following:    BUN 41 (*)     Creatinine, Ser 1.79 (*)     Albumin 2.4 (*)     Alkaline Phosphatase 119 (*)     Total Bilirubin 0.2 (*)     GFR calc non Af Amer 29 (*)     GFR calc Af Amer 33 (*)     All other components within normal limits  MRSA PCR SCREENING - Abnormal; Notable for the following:    MRSA by PCR POSITIVE (*)     All other components within normal limits  GLUCOSE, CAPILLARY - Abnormal; Notable for the following:    Glucose-Capillary 119 (*)     All other components within normal limits  GLUCOSE, CAPILLARY - Abnormal; Notable for the following:    Glucose-Capillary 109 (*)     All other components within normal limits  GLUCOSE, CAPILLARY - Abnormal; Notable for the following:    Glucose-Capillary 109 (*)     All other components within normal limits  TROPONIN I  URINALYSIS, ROUTINE W REFLEX MICROSCOPIC  CARDIAC PANEL(CRET KIN+CKTOT+MB+TROPI)  CARDIAC PANEL(CRET KIN+CKTOT+MB+TROPI)  TSH  LAB REPORT - SCANNED   No results found.   1. Chest pain   2. CHF (congestive heart failure)   3. Morbid obesity   4. Diabetes mellitus type II   5. HYPERLIPIDEMIA   6. HYPERTENSION   7. PULMONARY HYPERTENSION, SECONDARY   8. Obesity hypoventilation syndrome   9. SLEEP APNEA   10. Anasarca       MDM          Nelia Shi, MD 03/23/12 [phone]

## 2012-03-22 ENCOUNTER — Encounter (HOSPITAL_COMMUNITY): Payer: Self-pay | Admitting: General Practice

## 2012-03-22 DIAGNOSIS — E785 Hyperlipidemia, unspecified: Secondary | ICD-10-CM

## 2012-03-22 DIAGNOSIS — E662 Morbid (severe) obesity with alveolar hypoventilation: Secondary | ICD-10-CM

## 2012-03-22 LAB — CARDIAC PANEL(CRET KIN+CKTOT+MB+TROPI)
Relative Index: 1.4 (ref 0.0–2.5)
Total CK: 144 U/L (ref 7–177)
Total CK: 137 U/L (ref 7–177)
Troponin I: <0.3 ng/mL (ref <0.30)

## 2012-03-22 LAB — URINALYSIS, ROUTINE W REFLEX MICROSCOPIC
Bilirubin Urine: NEGATIVE
Ketones, ur: NEGATIVE mg/dL
Nitrite: NEGATIVE
Urobilinogen, UA: 0.2 mg/dL (ref 0.0–1.0)

## 2012-03-22 LAB — COMPREHENSIVE METABOLIC PANEL
Albumin: 2.4 g/dL — ABNORMAL LOW (ref 3.5–5.2)
BUN: 41 mg/dL — ABNORMAL HIGH (ref 6–23)
Creatinine, Ser: 1.79 mg/dL — ABNORMAL HIGH (ref 0.50–1.10)
GFR calc Af Amer: 33 mL/min — ABNORMAL LOW (ref >90)
Glucose, Bld: 85 mg/dL (ref 70–99)
Total Protein: 7.3 g/dL (ref 6.0–8.3)

## 2012-03-22 LAB — MRSA PCR SCREENING: MRSA by PCR: POSITIVE — AB

## 2012-03-22 LAB — CBC
HCT: 27.9 % — ABNORMAL LOW (ref 36.0–46.0)
MCH: 26.4 pg (ref 26.0–34.0)
MCHC: 31.9 g/dL (ref 30.0–36.0)
MCV: 82.8 fL (ref 78.0–100.0)
RDW: 16.1 % — ABNORMAL HIGH (ref 11.5–15.5)

## 2012-03-22 LAB — GLUCOSE, CAPILLARY
Glucose-Capillary: 109 mg/dL — ABNORMAL HIGH (ref 70–99)
Glucose-Capillary: 109 mg/dL — ABNORMAL HIGH (ref 70–99)

## 2012-03-22 LAB — TSH: TSH: 1.834 u[IU]/mL (ref 0.350–4.500)

## 2012-03-22 MED ORDER — ALBUTEROL SULFATE (5 MG/ML) 0.5% IN NEBU
2.5000 mg | INHALATION_SOLUTION | Freq: Four times a day (QID) | RESPIRATORY_TRACT | Status: DC
  Administered 2012-03-22 (×2): 2.5 mg via RESPIRATORY_TRACT
  Filled 2012-03-22 (×2): qty 0.5

## 2012-03-22 MED ORDER — GABAPENTIN 300 MG PO CAPS
300.0000 mg | ORAL_CAPSULE | Freq: Three times a day (TID) | ORAL | Status: DC
  Administered 2012-03-22 (×2): 300 mg via ORAL
  Filled 2012-03-22 (×4): qty 1

## 2012-03-22 MED ORDER — SODIUM CHLORIDE 0.9 % IV SOLN: 250.0000 mL | INTRAVENOUS | Status: DC | PRN

## 2012-03-22 MED ORDER — CHLORHEXIDINE GLUCONATE CLOTH 2 % EX PADS: 6.0000 | MEDICATED_PAD | Freq: Every day | CUTANEOUS | Status: DC

## 2012-03-22 MED ORDER — IPRATROPIUM BROMIDE 0.02 % IN SOLN
0.5000 mg | Freq: Four times a day (QID) | RESPIRATORY_TRACT | Status: DC
  Administered 2012-03-22 (×2): 0.5 mg via RESPIRATORY_TRACT
  Filled 2012-03-22 (×2): qty 2.5

## 2012-03-22 MED ORDER — METOPROLOL TARTRATE 12.5 MG HALF TABLET
12.5000 mg | ORAL_TABLET | Freq: Two times a day (BID) | ORAL | Status: DC
  Administered 2012-03-22 (×2): 12.5 mg via ORAL
  Filled 2012-03-22 (×3): qty 1

## 2012-03-22 MED ORDER — SODIUM CHLORIDE 0.9 % IJ SOLN: 3.0000 mL | Freq: Two times a day (BID) | INTRAMUSCULAR | Status: DC

## 2012-03-22 MED ORDER — SODIUM CHLORIDE 0.9 % IJ SOLN: 3.0000 mL | INTRAMUSCULAR | Status: DC | PRN

## 2012-03-22 MED ORDER — ISOSORBIDE MONONITRATE 10 MG PO TABS
10.0000 mg | ORAL_TABLET | Freq: Two times a day (BID) | ORAL | Status: DC
  Administered 2012-03-22: 10 mg via ORAL
  Filled 2012-03-22 (×3): qty 1

## 2012-03-22 MED ORDER — ONDANSETRON HCL 4 MG/2ML IJ SOLN: 4.0000 mg | Freq: Four times a day (QID) | INTRAMUSCULAR | Status: DC | PRN

## 2012-03-22 MED ORDER — OXYCODONE-ACETAMINOPHEN 7.5-325 MG PO TABS: 2.0000 | ORAL_TABLET | Freq: Four times a day (QID) | ORAL | Status: DC | PRN

## 2012-03-22 MED ORDER — HYDRALAZINE HCL 25 MG PO TABS
25.0000 mg | ORAL_TABLET | Freq: Three times a day (TID) | ORAL | Status: DC
  Administered 2012-03-22: 25 mg via ORAL
  Filled 2012-03-22 (×4): qty 1

## 2012-03-22 MED ORDER — PANTOPRAZOLE SODIUM 40 MG PO TBEC: 40.0000 mg | DELAYED_RELEASE_TABLET | Freq: Every day | ORAL | Status: DC

## 2012-03-22 MED ORDER — IPRATROPIUM BROMIDE 0.02 % IN SOLN: 0.5000 mg | Freq: Two times a day (BID) | RESPIRATORY_TRACT | Status: DC

## 2012-03-22 MED ORDER — MUPIROCIN 2 % EX OINT
1.0000 "application " | TOPICAL_OINTMENT | Freq: Two times a day (BID) | CUTANEOUS | Status: DC
  Administered 2012-03-22: 1 via NASAL
  Filled 2012-03-22: qty 22

## 2012-03-22 MED ORDER — COLCHICINE 0.6 MG PO TABS
0.6000 mg | ORAL_TABLET | Freq: Two times a day (BID) | ORAL | Status: DC
  Administered 2012-03-22 (×2): 0.6 mg via ORAL
  Filled 2012-03-22 (×3): qty 1

## 2012-03-22 MED ORDER — ALBUTEROL SULFATE (5 MG/ML) 0.5% IN NEBU: 2.5000 mg | INHALATION_SOLUTION | Freq: Two times a day (BID) | RESPIRATORY_TRACT | Status: DC

## 2012-03-22 MED ORDER — LOSARTAN POTASSIUM 50 MG PO TABS
100.0000 mg | ORAL_TABLET | Freq: Every day | ORAL | Status: DC
  Administered 2012-03-22: 100 mg via ORAL
  Filled 2012-03-22: qty 2

## 2012-03-22 MED ORDER — ALLOPURINOL 100 MG PO TABS
100.0000 mg | ORAL_TABLET | Freq: Two times a day (BID) | ORAL | Status: DC
  Administered 2012-03-22 (×2): 100 mg via ORAL
  Filled 2012-03-22 (×3): qty 1

## 2012-03-22 MED ORDER — FUROSEMIDE 10 MG/ML IJ SOLN
80.0000 mg | Freq: Two times a day (BID) | INTRAMUSCULAR | Status: DC
  Administered 2012-03-22: 80 mg via INTRAVENOUS
  Filled 2012-03-22 (×3): qty 8

## 2012-03-22 MED ORDER — DOCUSATE SODIUM 100 MG PO CAPS
100.0000 mg | ORAL_CAPSULE | Freq: Two times a day (BID) | ORAL | Status: DC
  Administered 2012-03-22: 100 mg via ORAL
  Filled 2012-03-22 (×2): qty 1

## 2012-03-22 MED ORDER — OXYCODONE HCL 5 MG PO TABS
2.5000 mg | ORAL_TABLET | Freq: Four times a day (QID) | ORAL | Status: DC | PRN
  Administered 2012-03-22: 08:00:00 via ORAL
  Administered 2012-03-22: 2.5 mg via ORAL
  Filled 2012-03-22 (×2): qty 1

## 2012-03-22 MED ORDER — ENOXAPARIN SODIUM 40 MG/0.4ML ~~LOC~~ SOLN
40.0000 mg | Freq: Every day | SUBCUTANEOUS | Status: DC
  Administered 2012-03-22: 40 mg via SUBCUTANEOUS
  Filled 2012-03-22: qty 0.4

## 2012-03-22 MED ORDER — ALBUTEROL SULFATE (5 MG/ML) 0.5% IN NEBU: 2.5000 mg | INHALATION_SOLUTION | RESPIRATORY_TRACT | Status: DC | PRN

## 2012-03-22 MED ORDER — ASPIRIN EC 325 MG PO TBEC
325.0000 mg | DELAYED_RELEASE_TABLET | Freq: Every day | ORAL | Status: DC
  Administered 2012-03-22: 325 mg via ORAL
  Filled 2012-03-22: qty 1

## 2012-03-22 MED ORDER — LEVOFLOXACIN 500 MG PO TABS
500.0000 mg | ORAL_TABLET | Freq: Every day | ORAL | Status: DC
  Administered 2012-03-22: 500 mg via ORAL
  Filled 2012-03-22: qty 1

## 2012-03-22 MED ORDER — SODIUM CHLORIDE 0.9 % IJ SOLN
3.0000 mL | Freq: Two times a day (BID) | INTRAMUSCULAR | Status: DC
Start: 2012-03-22 — End: 2012-03-22
  Administered 2012-03-22 (×2): 3 mL via INTRAVENOUS

## 2012-03-22 MED ORDER — INSULIN ASPART 100 UNIT/ML ~~LOC~~ SOLN: 0.0000 [IU] | Freq: Three times a day (TID) | SUBCUTANEOUS | Status: DC

## 2012-03-22 MED ORDER — ATORVASTATIN CALCIUM 40 MG PO TABS
40.0000 mg | ORAL_TABLET | Freq: Every day | ORAL | Status: DC
  Administered 2012-03-22: 40 mg via ORAL
  Filled 2012-03-22 (×2): qty 1

## 2012-03-22 MED ORDER — INSULIN GLARGINE 100 UNIT/ML ~~LOC~~ SOLN
55.0000 [IU] | Freq: Every day | SUBCUTANEOUS | Status: DC
  Administered 2012-03-22: 55 [IU] via SUBCUTANEOUS

## 2012-03-22 MED ORDER — MORPHINE SULFATE 2 MG/ML IJ SOLN
2.0000 mg | INTRAMUSCULAR | Status: DC | PRN
  Administered 2012-03-22: 2 mg via INTRAVENOUS
  Filled 2012-03-22: qty 1

## 2012-03-22 MED ORDER — OXYCODONE-ACETAMINOPHEN 5-325 MG PO TABS
1.0000 | ORAL_TABLET | Freq: Four times a day (QID) | ORAL | Status: DC | PRN
  Administered 2012-03-22 (×2): 1 via ORAL
  Filled 2012-03-22 (×2): qty 1

## 2012-03-22 MED ORDER — NITROGLYCERIN 0.4 MG SL SUBL: 0.4000 mg | SUBLINGUAL_TABLET | SUBLINGUAL | Status: DC | PRN

## 2012-03-22 MED ORDER — ONDANSETRON HCL 4 MG PO TABS: 4.0000 mg | ORAL_TABLET | Freq: Four times a day (QID) | ORAL | Status: DC | PRN

## 2012-03-22 NOTE — Progress Notes (Signed)
CRITICAL VALUE ALERT  Critical value received:MRSA PCR +  Date of notification:  03/22/12  Time of notification:  0710  Critical value read back: yes  Nurse who received alert:  G. Burnell Matlin  MD notified (1st page):  Benedetto Coons  Time of first page: 0710  MD notified (2nd page):  Time of second page:  Responding MD: Benedetto Coons  Time MD responded:  [phone]

## 2012-03-22 NOTE — Progress Notes (Signed)
Admitted pt from ED to rm [id number], pt alert and oriented, call bell placed within reach, oriented to room. Admission assessment done, foley cath intact, pt with wound vac to left thigh. Admission orders carried out. Will continue to monitor.  Filed Vitals:   03/22/12 0210  BP: 119/87  Pulse: 78  Temp: 97.3 F (36.3 C)  Resp: 20    [name], [address]

## 2012-03-22 NOTE — Progress Notes (Signed)
CSW was consulted to speak with Pt. About SNF placement eligibility, and supportive counseling. Patient is not currently eligible for SNF referral i.e: not admitted for 3 days, and no Physical Therapy recommendation. Patient requested a new physician referral to help facilitate SNF process on an outpatient basis. CSW spoke to MD about d/c home, and referral request. CSW called [facility] to provide transportation for d/c.  CSW signing off as no other CSW needs identified at this time.  Lia Foyer, LCSWA [facility] Clinical Social Worker Contact #: [phone] (weekend)

## 2012-03-22 NOTE — Discharge Summary (Signed)
Physician Discharge Summary  Kelly [name] ZOX:[id number] DOB: December 09, 1945 DOA: 03/21/2012  PCP: Alva Garnet., MD  Admit date: 03/21/2012 Discharge date: 03/22/2012  Recommendations for Outpatient Follow-up:  1. Please help set up outpatient stress test.  Was unable to obtain at the hospital due to excessive weight exceeding limits here.  Also based on patient's complaints assess to see if patient qualifies for SNF vs ALF. 2. Please also check creatinine level.  Last value here was 1.7  Discharge Diagnoses:  Active Problems:  HYPERLIPIDEMIA  HYPERTENSION  PULMONARY HYPERTENSION, SECONDARY  Chest pain  Diabetes mellitus type II  Obesity hypoventilation syndrome  Plan #1 chest pain: - Telemetry did not show any red flags and was sinus rhythm - Cardiac enzymes were negative - Patient was unable to get stress test here due to weight.  At the hospital we are unable to obtain stress test if patient exceeds 350 lbs and she is currently weighing  > 360 lbs - Discussed with patient's cardiologist who will try to set up appointment with facility that can examine patients with > 360 lbs.  Patient is aware at this juncture.  #2 diabetes: Continue his home medications.   #3 hypertension: Blood pressure seems controlled and also we will continue his home medications.   #4 CHF: We'll continue her home medicines and continue her home regimen of lasix  #5 chronic lower extremity edema with wounds: Continue with her vac draining in the hospital and get wound care consult   #6 obesity hypoventilation syndrome: Stable. I will check pulse oxymetry off of supplemental oxygen.  Patient is off of oxygen at home.  #7 morbid obesity:  - As mentioned above limited ability to get stress test.  Will have patient f/u with her cardiologist for further recommendations.  8 Chronic Renal failure - Patient has had creatinine in level of 1.2 -1.7 based on prior values.  Last value has decreased from 1.9 to  1.7 today.   Suspect with improved po intake patient's creatinine will improve.  Also patient had recent increase in lasix from po to IV and I suspect that once she goes on her lower dose of lasix her creatinine will continue to improve.  Will discuss with patient and have her check with her primary care physician.   Discharge Condition: stable  Diet recommendation: Diabetic/heart healthy  History of present illness:  From original HPI: A 66 year old morbidly obese female with history of congestive heart failure mainly right-sided failure including obesity hypoventilation syndrome was seen today by her cardiologist Dr. Yates Decamp for chest pain. She was being prepped for a stress test. The could not do the test in the office and she was told she will have it done in the hospital. Patient went home and started feeling chest pain headaches and generalized with the aches as well as shortness of breath. She couldn't stand up she had to lay down. Her friend noted it and called EMS. She has since been stable since arrival in the ED. Chest pain was centrally located described as 4/10 radiating mainly to was her left arm. She felt like every part of her body was aching thereafter. She denied cough no fever no dizziness. Patient has well known lower extremety. edema with home health coming out. She has a VAC for her fluids draining. He also has chronic stasis dermatitis.  Hospital Course:  While here patient received lasix 80 mg IV BID and had > 1 Liter total in output.  Her breathing status is  back to baseline and patient's main concern was her legs and the drainage she has been having.  She reported difficulty with changing her dressings.  She does mention that she has home health and a nurse that comes out to help her at home.  She inquired about SNF but unfortunately we were not able to set that up for patient at this hospital visit.    Otherwise patient was placed on telemetry which did not show any  arrythmias.  Cardiac Enzymes were within normal limits x 3 and on discharge patient felt much better reportedly.  Procedures:  None  Consultations:  None, although discussed case with cardiology  Discharge Exam: Filed Vitals:   03/22/12 0900  BP: 122/56  Pulse: 71  Temp: 97 F (36.1 C)  Resp: 20   Filed Vitals:   03/22/12 0249 03/22/12 0623 03/22/12 0728 03/22/12 0900  BP:  115/57  122/56  Pulse:  73  71  Temp:  97.4 F (36.3 C)  97 F (36.1 C)  TempSrc:    Oral  Resp:  18  20  Height:      Weight:  166.245 kg (366 lb 8.1 oz)    SpO2: 100% 100% 100% 97%    General: Patient in NAD, Alert and Oriented Cardiovascular: RRR, No M/R/G Respiratory: CTA BL, No wheezes  Discharge Instructions  Discharge Orders    Future Appointments: Provider: Department: Dept Phone: Center:   03/28/2012 2:00 PM [facility] [phone] [facility]     Future Orders Please Complete By Expires   Diet - low sodium heart healthy      Increase activity slowly      Discharge instructions      Comments:   Please follow up with your primary care physician in 1-2 weeks post discharge or sooner should any new concerns arise.    Also follow up with your cardiologist so you can make arrangements to set up for a stress test at [facility] due to restrictions here due to your excess weight.     Medication List  As of 03/22/2012  1:26 PM   TAKE these medications         allopurinol 100 MG tablet   Commonly known as: ZYLOPRIM   Take 100 mg by mouth 2 (two) times daily.      aspirin 325 MG EC tablet   Take 325 mg by mouth daily.      atorvastatin 40 MG tablet   Commonly known as: LIPITOR   Take 40 mg by mouth at bedtime.      colchicine 0.6 MG tablet   Take 0.6 mg by mouth 2 (two) times daily.      esomeprazole 40 MG capsule   Commonly known as: NEXIUM   Take 40 mg by mouth daily before breakfast.      furosemide 80 MG tablet   Commonly known as: LASIX   Take 80 mg by  mouth daily.      gabapentin 300 MG capsule   Commonly known as: NEURONTIN   Take 300 mg by mouth 3 (three) times daily.      hydrALAZINE 25 MG tablet   Commonly known as: APRESOLINE   Take 1 tablet (25 mg total) by mouth 3 (three) times daily.      insulin glargine 100 UNIT/ML injection   Commonly known as: LANTUS   Inject 55 Units into the skin at bedtime. As directed      isosorbide mononitrate 10 MG tablet  Commonly known as: ISMO,MONOKET   Take 10 mg by mouth 2 (two) times daily.      levofloxacin 500 MG tablet   Commonly known as: LEVAQUIN   Take 500 mg by mouth daily.      losartan 100 MG tablet   Commonly known as: COZAAR   Take 100 mg by mouth daily.      metoprolol tartrate 25 MG tablet   Commonly known as: LOPRESSOR   Take 12.5 mg by mouth 2 (two) times daily.      oxyCODONE-acetaminophen 7.5-325 MG per tablet   Commonly known as: PERCOCET   Take 2 tablets by mouth every 6 (six) hours as needed. For pain              The results of significant diagnostics from this hospitalization (including imaging, microbiology, ancillary and laboratory) are listed below for reference.    Significant Diagnostic Studies: Dg Chest 2 View  03/21/2012  *RADIOLOGY REPORT*  Clinical Data: 66 year old female with chest pain.  CHEST - 2 VIEW  Comparison: 01/04/2012 and prior chest radiographs  Findings: Cardiomegaly and pulmonary vascular congestion identified. Mild interstitial opacities are identified and may represent interstitial edema. There is no evidence of focal airspace disease, suspicious pulmonary nodule/mass, pleural effusion, or pneumothorax. No acute bony abnormalities are identified.  IMPRESSION: Cardiomegaly with pulmonary vascular congestion and possible mild interstitial pulmonary edema.  Original Report Authenticated By: Rosendo Gros, M.D.    Microbiology: Recent Results (from the past 240 hour(s))  MRSA PCR SCREENING     Status: Abnormal   Collection Time     03/22/12  2:04 AM      Component Value Range Status Comment   MRSA by PCR POSITIVE (*) NEGATIVE Final      Labs: Basic Metabolic Panel:  Lab 03/22/12 4098 03/21/12 2137  NA 142 142  K 4.3 4.7  CL 109 112  CO2 23 --  GLUCOSE 85 74  BUN 41* 43*  CREATININE 1.79* 1.90*  CALCIUM 8.7 --  MG -- --  PHOS -- --   Liver Function Tests:  Lab 03/22/12 0215  AST 16  ALT 15  ALKPHOS 119*  BILITOT 0.2*  PROT 7.3  ALBUMIN 2.4*   No results found for this basename: LIPASE:5,AMYLASE:5 in the last 168 hours No results found for this basename: AMMONIA:5 in the last 168 hours CBC:  Lab 03/22/12 0215 03/21/12 2137  WBC 5.6 --  NEUTROABS -- --  HGB 8.9* 9.9*  HCT 27.9* 29.0*  MCV 82.8 --  PLT 236 --   Cardiac Enzymes:  Lab 03/22/12 0939 03/22/12 0214 03/21/12 2116  CKTOTAL 137 144 --  CKMB 1.9 2.0 --  CKMBINDEX -- -- --  TROPONINI <0.30 <0.30 <0.30   BNP: BNP (last 3 results)  Basename 03/21/12 2116 01/04/12 1714 09/20/11 1152  PROBNP 407.5* 193.9* 606.2*   CBG:  Lab 03/22/12 1055 03/22/12 0602 03/22/12 0326  GLUCAP 109* 109* 119*    Time coordinating discharge: > 35 minutes minutes  Signed:  Penny Pia  [facility] 03/22/2012, 1:26 PM

## 2012-03-22 NOTE — ED Notes (Signed)
Foley inserted without difficulty using sterile technique

## 2012-03-24 NOTE — Progress Notes (Signed)
Utilization review completed.  

## 2012-03-28 ENCOUNTER — Encounter (HOSPITAL_BASED_OUTPATIENT_CLINIC_OR_DEPARTMENT_OTHER): Payer: Medicare Other

## 2012-04-04 ENCOUNTER — Encounter (HOSPITAL_BASED_OUTPATIENT_CLINIC_OR_DEPARTMENT_OTHER): Payer: Medicare Other | Attending: General Surgery

## 2012-04-04 DIAGNOSIS — L97809 Non-pressure chronic ulcer of other part of unspecified lower leg with unspecified severity: Secondary | ICD-10-CM | POA: Insufficient documentation

## 2012-04-04 DIAGNOSIS — I890 Lymphedema, not elsewhere classified: Secondary | ICD-10-CM | POA: Insufficient documentation

## 2012-05-16 ENCOUNTER — Encounter (HOSPITAL_BASED_OUTPATIENT_CLINIC_OR_DEPARTMENT_OTHER): Payer: Medicare Other | Attending: General Surgery

## 2012-05-16 DIAGNOSIS — L97809 Non-pressure chronic ulcer of other part of unspecified lower leg with unspecified severity: Secondary | ICD-10-CM | POA: Insufficient documentation

## 2012-05-16 DIAGNOSIS — I890 Lymphedema, not elsewhere classified: Secondary | ICD-10-CM | POA: Insufficient documentation

## 2012-06-13 ENCOUNTER — Encounter (HOSPITAL_BASED_OUTPATIENT_CLINIC_OR_DEPARTMENT_OTHER): Payer: Medicare Other

## 2012-06-20 ENCOUNTER — Encounter (HOSPITAL_BASED_OUTPATIENT_CLINIC_OR_DEPARTMENT_OTHER): Payer: Medicare Other | Attending: General Surgery

## 2012-06-20 DIAGNOSIS — I890 Lymphedema, not elsewhere classified: Secondary | ICD-10-CM | POA: Insufficient documentation

## 2012-06-20 DIAGNOSIS — L97809 Non-pressure chronic ulcer of other part of unspecified lower leg with unspecified severity: Secondary | ICD-10-CM | POA: Insufficient documentation

## 2012-07-10 ENCOUNTER — Encounter: Payer: Self-pay | Admitting: Internal Medicine

## 2012-07-14 ENCOUNTER — Encounter (HOSPITAL_BASED_OUTPATIENT_CLINIC_OR_DEPARTMENT_OTHER): Payer: Medicare Other | Attending: General Surgery

## 2012-07-14 DIAGNOSIS — I890 Lymphedema, not elsewhere classified: Secondary | ICD-10-CM | POA: Insufficient documentation

## 2012-07-14 DIAGNOSIS — L97809 Non-pressure chronic ulcer of other part of unspecified lower leg with unspecified severity: Secondary | ICD-10-CM | POA: Insufficient documentation

## 2012-07-15 NOTE — Progress Notes (Cosign Needed)
[facility]  NAME:  Kelly [name], Kelly [name]               ACCOUNT NO.:  [id number]  MEDICAL RECORD NO.:  [id number]      DATE OF BIRTH:  01-Dec-1945  PHYSICIAN:  Wayland Denis, DO       VISIT DATE:  07/14/2012                                  OFFICE VISIT   The patient is a 66 year old female who is here for followup on her bilateral lower extremity posterior leg lymphedema ulcers.  She is trying to lose weight.  She has been using some Santyl on the area without much improvement.  She is also being evaluated for gastric bypass surgery and we were hopeful that she will be a candidate for that.  There has been no change in her medications or social history otherwise.  On exam, she is alert, oriented, and cooperative, not in any acute distress.  She is pleasant.  Her pupils are equal.  Her breathing is unlabored.  The ulcers sizes are noted in the notes, slightly larger than last visit.  Recommend continuing with the alginate, Santyl and have her follow up in couple of weeks.  Also, we will check a prealbumin to see what her nutrition level is, and recommend multivitamin, vitamin C, and zinc, and offloading as well.     Wayland Denis, DO     CS/MEDQ  D:  07/14/2012  T:  07/15/2012  Job:  [id number]

## 2012-08-01 ENCOUNTER — Encounter (HOSPITAL_BASED_OUTPATIENT_CLINIC_OR_DEPARTMENT_OTHER): Payer: Medicare Other | Attending: Plastic Surgery

## 2012-08-01 DIAGNOSIS — L97809 Non-pressure chronic ulcer of other part of unspecified lower leg with unspecified severity: Secondary | ICD-10-CM | POA: Insufficient documentation

## 2012-08-01 DIAGNOSIS — I890 Lymphedema, not elsewhere classified: Secondary | ICD-10-CM | POA: Insufficient documentation

## 2012-08-04 ENCOUNTER — Ambulatory Visit (INDEPENDENT_AMBULATORY_CARE_PROVIDER_SITE_OTHER): Payer: Medicare Other | Admitting: Cardiovascular Disease

## 2012-08-04 ENCOUNTER — Telehealth: Payer: Self-pay | Admitting: *Deleted

## 2012-08-04 ENCOUNTER — Encounter: Payer: Self-pay | Admitting: Cardiovascular Disease

## 2012-08-04 VITALS — BP 130/68 | HR 88 | Ht 66.0 in | Wt 367.8 lb

## 2012-08-04 DIAGNOSIS — R002 Palpitations: Secondary | ICD-10-CM | POA: Insufficient documentation

## 2012-08-04 NOTE — Patient Instructions (Addendum)
Your physician has recommended that you wear an event monitor. Event monitors are medical devices that record the heart's electrical activity. Doctors most often [place] these monitors to diagnose arrhythmias. Arrhythmias are problems with the speed or rhythm of the heartbeat. The monitor is a small, portable device. You can wear one while you do your normal daily activities. This is usually used to diagnose what is causing palpitations/syncope (passing out).  Your physician recommends that you schedule a follow-up appointment in: as needed basis/ depending on monitor results.

## 2012-08-04 NOTE — Progress Notes (Signed)
Kelly [name] Date of Birth  [birth year]       [facility]    [place] [address]  [address] [place], [place]  [address]   [place], [place]  [address] [phone]     [phone]   Fax  [phone]    Fax [phone]   Primary Cardiologist:  Sherilyn Banker, MD  Problem List: 1. Palpitations 2. Morbid obesity 3. Hypertension 4, hyperlipidemia 5. Sleep apnea- she does not wear her CPAP or night time O2. ( power issues at her appartment) 6. Diabetes Mellitus.  History of Present Illness:  [name] is a 66 year old female who presents today for a second opinion of palpitations and fatigue.  She has seen Dr. Nadara Eaton for the past year.  She's also had chronic leg lymphedema for many years. She's been hospitalized on several occasions for that.   Her legs weep almost constantly.   She has been seen at the wound care center.   She has also seen a Engineer, petroleum at [facility].  He advised her to lose weight.    She denies any chest pain. She notes that her heart beats   faster and harder when she does housework.   An echocardiogram performed in January reveals normal left ventricular systolic function. She does have some degree of diastolic dysfunction.  She does not get any exercise. She is quite debilitated. She was unable to get up on the exam table.  Current Outpatient Prescriptions on File Prior to Visit  Medication Sig Dispense Refill  . allopurinol (ZYLOPRIM) 100 MG tablet Take 100 mg by mouth 2 (two) times daily.      Marland Kitchen aspirin 325 MG EC tablet Take 325 mg by mouth daily.      Marland Kitchen atorvastatin (LIPITOR) 40 MG tablet Take 40 mg by mouth at bedtime.      . colchicine 0.6 MG tablet Take 0.6 mg by mouth 2 (two) times daily.      Marland Kitchen esomeprazole (NEXIUM) 40 MG capsule Take 40 mg by mouth daily before breakfast.      . furosemide (LASIX) 80 MG tablet Take 80 mg by mouth daily.      Marland Kitchen gabapentin (NEURONTIN) 300 MG capsule Take 300  mg by mouth 3 (three) times daily.      . hydrALAZINE (APRESOLINE) 25 MG tablet Take 1 tablet (25 mg total) by mouth 3 (three) times daily.  60 tablet  2  . insulin glargine (LANTUS) 100 UNIT/ML injection Inject 55 Units into the skin at bedtime. As directed      . isosorbide mononitrate (ISMO,MONOKET) 10 MG tablet Take 10 mg by mouth 2 (two) times daily.      Marland Kitchen levofloxacin (LEVAQUIN) 500 MG tablet Take 500 mg by mouth daily.      Marland Kitchen losartan (COZAAR) 100 MG tablet Take 100 mg by mouth daily.      . metoprolol tartrate (LOPRESSOR) 25 MG tablet Take 12.5 mg by mouth 2 (two) times daily.        Allergies  Allergen Reactions  . Penicillins Hives    Past Medical History  Diagnosis Date  . Hypertension   . Diabetes mellitus   . Hyperlipidemia   . Sleep apnea   . CHF (congestive heart failure)   . Headache   . GERD (gastroesophageal reflux disease)   . Anemia     Past Surgical History  Procedure Date  . Rotator cuff repair   .  Knee arthroscopy   . Temple biopsy     History  Smoking status  . Former Smoker  Smokeless tobacco  . Not on file    History  Alcohol Use No    Family History  Problem Relation Age of Onset  . Emphysema Father   . Asthma Brother   . Heart disease Mother     grandmother  . Stomach cancer Brother     Reviw of Systems:  Reviewed in the HPI.  All other systems are negative.  Physical Exam: Blood pressure 130/68, pulse 88, height 5\' 6"  (1.676 m), weight 367 lb 12.8 oz (166.833 kg), SpO2 98.00%. General: Well developed, well nourished, in no acute distress.  Head: Normocephalic, atraumatic, sclera non-icteric, mucus membranes are moist,   Neck: her neck is very thick, unable to assess JVP.  Lungs: Clear   Heart: distant heart sounds, RR  Abdomen: Soft, non-tender, morbid obesity,  + BS  Msk:  Walks with a walker.  Very weak.  Could not get up on the exam table.  Extremities: massive lymphedema with evidence of weeping and chronic  stasis changed within each "mass" of lymphedema  Neuro: CN II - XII intact.  Alert and oriented X 3.   Psych:  Normal   ECG:  Assessment / Plan:

## 2012-08-04 NOTE — Assessment & Plan Note (Signed)
Ms. Logue presents with several issues. Her primary issue is that of massive lymphedema involving her abdomen and lower extremities.   She has massive "individual collections" of lymphedema in her legs. These have been there so long that she now has chronic stasis changes isolated to each individual collection of lymphedema.  These collections of lymphedema frequently we. She has seen the wound doctor and also has seen a Engineer, petroleum.  Her lungs are clear. Her echocardiogram reveals normal left ventricular systolic function with mild diastolic dysfunction.  Her palpitations seem to be due to exertion but she's cleaning house. She does not get any regular exercise. I suspect that she's having some sinus tachycardia as result of the exertion of doing house cleaning. She is a morbidly obese, suspect a lot of this is due to her at weight. We'll place an event monitor on her to rule out dangerous arrhythmias. If the event monitor reveals any significant dangerous arrhythmias then we will arrange for followup.   Otherwise I've asked her to followup with Dr. Nadara Eaton.  I think most of her problem is due to her morbid obesity. She also has massive lymphedema but has normal left ventricular systolic function.  Her diastolic dysfunction is mild and is not the cause of her massive lymphedema.   I told her that I did not have any further advice to offer her regarding this massive lymphedema.    She will follow up with her medical doctor and Dr. Nadara Eaton.

## 2012-08-04 NOTE — Telephone Encounter (Signed)
Message copied by Antony Odea on Mon Aug 04, 2012  2:05 PM ------      Message from: Mariane Masters D      Created: Mon Aug 04, 2012 11:50 AM      Regarding: MONITOR       08/04/12  Monitor will be mailed.

## 2012-08-04 NOTE — Telephone Encounter (Signed)
noted 

## 2012-08-07 ENCOUNTER — Encounter: Payer: Self-pay | Admitting: Internal Medicine

## 2012-08-08 ENCOUNTER — Ambulatory Visit: Payer: Medicare Other | Admitting: Internal Medicine

## 2012-08-11 ENCOUNTER — Encounter (HOSPITAL_BASED_OUTPATIENT_CLINIC_OR_DEPARTMENT_OTHER): Payer: Medicare Other

## 2012-08-11 ENCOUNTER — Telehealth: Payer: Self-pay | Admitting: *Deleted

## 2012-08-11 NOTE — Telephone Encounter (Signed)
Pt was enrolled for 30 day event monitor to be mailed 12/16/113. TK

## 2012-08-12 ENCOUNTER — Other Ambulatory Visit: Payer: Self-pay | Admitting: Gastroenterology

## 2012-08-12 ENCOUNTER — Other Ambulatory Visit (INDEPENDENT_AMBULATORY_CARE_PROVIDER_SITE_OTHER): Payer: Medicare Other

## 2012-08-12 ENCOUNTER — Ambulatory Visit (INDEPENDENT_AMBULATORY_CARE_PROVIDER_SITE_OTHER): Payer: Medicare Other | Admitting: Internal Medicine

## 2012-08-12 ENCOUNTER — Encounter: Payer: Self-pay | Admitting: Internal Medicine

## 2012-08-12 ENCOUNTER — Other Ambulatory Visit: Payer: Self-pay | Admitting: Internal Medicine

## 2012-08-12 VITALS — BP 114/72 | HR 104 | Ht 64.0 in | Wt 376.0 lb

## 2012-08-12 DIAGNOSIS — R1011 Right upper quadrant pain: Secondary | ICD-10-CM

## 2012-08-12 DIAGNOSIS — R1012 Left upper quadrant pain: Secondary | ICD-10-CM

## 2012-08-12 DIAGNOSIS — Z1211 Encounter for screening for malignant neoplasm of colon: Secondary | ICD-10-CM

## 2012-08-12 DIAGNOSIS — D649 Anemia, unspecified: Secondary | ICD-10-CM

## 2012-08-12 DIAGNOSIS — K625 Hemorrhage of anus and rectum: Secondary | ICD-10-CM

## 2012-08-12 LAB — COMPREHENSIVE METABOLIC PANEL
AST: 26 U/L (ref 0–37)
Albumin: 3.3 g/dL — ABNORMAL LOW (ref 3.5–5.2)
Alkaline Phosphatase: 157 U/L — ABNORMAL HIGH (ref 39–117)
BUN: 43 mg/dL — ABNORMAL HIGH (ref 6–23)
Creatinine, Ser: 1.6 mg/dL — ABNORMAL HIGH (ref 0.4–1.2)
Glucose, Bld: 76 mg/dL (ref 70–99)
Potassium: 4.7 mEq/L (ref 3.5–5.1)
Total Bilirubin: 0.6 mg/dL (ref 0.3–1.2)

## 2012-08-12 LAB — CBC WITH DIFFERENTIAL/PLATELET
Basophils Relative: 0.2 % (ref 0.0–3.0)
Eosinophils Absolute: 0.4 10*3/uL (ref 0.0–0.7)
Eosinophils Relative: 6.6 % — ABNORMAL HIGH (ref 0.0–5.0)
HCT: 33.6 % — ABNORMAL LOW (ref 36.0–46.0)
Lymphs Abs: 1 10*3/uL (ref 0.7–4.0)
MCHC: 31.9 g/dL (ref 30.0–36.0)
MCV: 83.9 fl (ref 78.0–100.0)
Monocytes Absolute: 0.4 10*3/uL (ref 0.1–1.0)
Neutrophils Relative %: 68.3 % (ref 43.0–77.0)
Platelets: 227 10*3/uL (ref 150.0–400.0)
RBC: 4 Mil/uL (ref 3.87–5.11)

## 2012-08-12 LAB — FERRITIN: Ferritin: 128.2 ng/mL (ref 10.0–291.0)

## 2012-08-12 NOTE — Patient Instructions (Addendum)
  Your physician has requested that you go to the basement for  lab work before leaving today.   You have been scheduled for a colonoscopy with propofol. Please follow written instructions given to you at your visit today.  Please pick up your prep kit at the pharmacy within the next 1-3 days. If you use inhalers (even only as needed) or a CPAP machine, please bring them with you on the day of your procedure.  You have been scheduled for a CT scan of the abdomen and pelvis at [facility] ([address]---this is in the same building as [facility]).   You are scheduled on 08/14/2012 at 10:00am. You should arrive 15 minutes prior to your appointment time for registration. Please follow the written instructions below on the day of your exam:  WARNING: IF YOU ARE ALLERGIC TO IODINE/X-RAY DYE, PLEASE NOTIFY RADIOLOGY IMMEDIATELY AT [phone]! YOU WILL BE GIVEN A 13 HOUR PREMEDICATION PREP.  1) Do not eat or drink anything after 6:00am (4 hours prior to your test) 2) You have been given 2 bottles of oral contrast to drink. The solution may taste  better if refrigerated, but do NOT add ice or any other liquid to this solution. Shake  well before drinking.    Drink 1 bottle of contrast @ 8:00 (2 hours prior to your exam)  Drink 1 bottle of contrast @ 9:00 (1 hour prior to your exam)  You may take any medications as prescribed with a small amount of water except for the following: Metformin, Glucophage, Glucovance, Avandamet, Riomet, Fortamet, Actoplus Met, Janumet, Glumetza or Metaglip. The above medications must be held the day of the exam AND 48 hours after the exam.    The purpose of you drinking the oral contrast is to aid in the visualization of your intestinal tract. The contrast solution may cause some diarrhea. Before your exam is started, you will be given a small amount of fluid to drink. Depending on your individual set of symptoms, you may also receive an  intravenous injection of x-ray contrast/dye. Plan on being at [facility] for 30 minutes or long, depending on the type of exam you are having performed.  If you have any questions regarding your exam or if you need to reschedule, you may call the CT department at [phone] between the hours of 8:00 am and 5:00 pm, Monday-Friday.  ________________________________________________________________________     .

## 2012-08-12 NOTE — Progress Notes (Signed)
Patient ID: ANJULI GEMMILL, female   DOB: 07-Aug-1946, 66 y.o.   MRN: [id number]  SUBJECTIVE: HPI Mrs. Hurless is a 66 yo female with PMH of morbid obesity, hypertension, hyperlipidemia, diabetes, sleep apnea, GERD, anemia who seen in consultation at the request of Dr. Renae Gloss for evaluation of upper abdominal pain. The patient reports that she has a history of upper abdominal pain, primarily left upper quadrant discomfort which seemed to start several years ago. She reports this was present for some time and then went away, and that she did not think about it much. Then over the last few months she reports the pain has returned. She reports that his abdominal soreness and discomfort which is at times palpable. She notices it most in her left upper quadrant and she reports feeling a "knot" in this location. She reports it comes and goes throughout the day, and that she is able to "push on it" or massage it until it "pops" and then resolves.  She specifically reports it does not relate to eating or bowel movement. It is worse with bending over or twisting. She denies nausea, vomiting, heartburn, dysphagia or odynophagia. Her bowel habits are regular and this is been long-standing. She's had no diarrhea. No melena. She reports at times constipation and her stools vary from daily, to every other day, every 3-4 days. She did have a single isolated episode of painless bright red rectal bleeding about 4 weeks ago. This has not recurred. She does recall a colonoscopy performed about 10 years ago at [facility], which reportedly was normal. She also recalls an EGD performed by Dr. Madilyn Fireman in evaluation of her upper abdominal pain, which she recalls was normal. She denies previous abdominal surgery. She does report occasional bloating. Appetite seems to fluctuate. She feels her weight is stable though today he reports the scales indicate she has gained weight.  She reports she is in the process and very much wants to  pursue gastric bypass surgery.  Apparently this is ongoing at [facility]  Review of Systems  As per history of present illness, otherwise negative   Past Medical History  Diagnosis Date  . Hypertension   . Diabetes mellitus   . Hyperlipidemia   . Sleep apnea   . CHF (congestive heart failure)   . Headache   . GERD (gastroesophageal reflux disease)   . Anemia   . Arthritis   . Depression     Current Outpatient Prescriptions  Medication Sig Dispense Refill  . allopurinol (ZYLOPRIM) 100 MG tablet Take 100 mg by mouth 2 (two) times daily.      Marland Kitchen aspirin 325 MG EC tablet Take 325 mg by mouth daily.      Marland Kitchen atorvastatin (LIPITOR) 40 MG tablet Take 40 mg by mouth at bedtime.      . colchicine 0.6 MG tablet Take 0.6 mg by mouth 2 (two) times daily.      Marland Kitchen esomeprazole (NEXIUM) 40 MG capsule Take 40 mg by mouth daily before breakfast.      . furosemide (LASIX) 80 MG tablet Take 80 mg by mouth daily.      Marland Kitchen gabapentin (NEURONTIN) 300 MG capsule Take 300 mg by mouth 3 (three) times daily.      . hydrALAZINE (APRESOLINE) 25 MG tablet Take 1 tablet (25 mg total) by mouth 3 (three) times daily.  60 tablet  2  . insulin glargine (LANTUS) 100 UNIT/ML injection Inject 55 Units into the skin at  bedtime. As directed      . isosorbide mononitrate (ISMO,MONOKET) 10 MG tablet Take 10 mg by mouth 2 (two) times daily.      Marland Kitchen levofloxacin (LEVAQUIN) 500 MG tablet Take 500 mg by mouth daily.      Marland Kitchen losartan (COZAAR) 100 MG tablet Take 100 mg by mouth daily.      . metoprolol tartrate (LOPRESSOR) 25 MG tablet Take 12.5 mg by mouth 2 (two) times daily.      . OxyCODONE (OXYCONTIN) 10 mg T12A Take 10 mg by mouth every 12 (twelve) hours.      . OXYCODONE HCL PO Take 5 mg by mouth as needed.      . zolpidem (AMBIEN) 5 MG tablet Take 5 mg by mouth at bedtime as needed.         Allergies  Allergen Reactions  . Penicillins Hives    Family History  Problem Relation Age of Onset  .  Emphysema Father   . Asthma Brother   . Heart disease Mother   . Stomach cancer Brother   . Heart disease Maternal Grandmother   . Diabetes Mother   . Diabetes Sister     History  Substance Use Topics  . Smoking status: Former Smoker    Types: Cigarettes    Quit date: 08/27/1981  . Smokeless tobacco: Never Used  . Alcohol Use: No    OBJECTIVE: BP 114/72  Pulse 104  Ht 5\' 4"  (1.626 m)  Wt 376 lb (170.552 kg)  BMI 64.54 kg/m2 Constitutional: Well-developed and well-nourished. Morbidly obese in no acute distress HEENT: Normocephalic and atraumatic. Oropharynx is clear and moist. No oropharyngeal exudate. Conjunctivae are normal. No scleral icterus. Neck: Neck supple. Trachea midline. Cardiovascular: Normal rate, regular rhythm and intact distal pulses. No M/R/G Pulmonary/chest: Effort normal and breath sounds normal. No wheezing, rales or rhonchi. Abdominal: Soft, obese, mildly tender in the upper abdomen without rebound or guarding nontender, nondistended. Bowel sounds active throughout.  Extremities: no clubbing, cyanosis, bilateral lower extremity lymphedema Neurological: Alert and oriented to person place and time. Skin: Skin is warm and dry. No rashes noted. Psychiatric: Normal mood and affect. Behavior is normal.  Labs and Imaging -- CBC    Component Value Date/Time   WBC 5.6 03/22/2012 0215   RBC 3.37* 03/22/2012 0215   HGB 8.9* 03/22/2012 0215   HCT 27.9* 03/22/2012 0215   PLT 236 03/22/2012 0215   MCV 82.8 03/22/2012 0215   MCH 26.4 03/22/2012 0215   MCHC 31.9 03/22/2012 0215   RDW 16.1* 03/22/2012 0215   LYMPHSABS 1.2 09/20/2011 1152   MONOABS 0.4 09/20/2011 1152   EOSABS 0.5 09/20/2011 1152   BASOSABS 0.0 09/20/2011 1152    CMP     Component Value Date/Time   NA 142 03/22/2012 0215   K 4.3 03/22/2012 0215   CL 109 03/22/2012 0215   CO2 23 03/22/2012 0215   GLUCOSE 85 03/22/2012 0215   BUN 41* 03/22/2012 0215   CREATININE 1.79* 03/22/2012 0215   CALCIUM 8.7  03/22/2012 0215   PROT 7.3 03/22/2012 0215   ALBUMIN 2.4* 03/22/2012 0215   AST 16 03/22/2012 0215   ALT 15 03/22/2012 0215   ALKPHOS 119* 03/22/2012 0215   BILITOT 0.2* 03/22/2012 0215   GFRNONAA 29* 03/22/2012 0215   GFRAA 33* 03/22/2012 0215   Labs dated 07/04/2012 Lipase 12, BNP 58.6 TSH 2.3 Hemoglobin A1c 5.8 Hemoglobin 10.1, hematocrit 34, MCV 85, platelets 245, WBC 5 Albumin 3.4, AST 33, ALT  34 alkaline phosphatase 206 total bili 0.2 Sodium 141 potassium 4.5 chloride 108 carbon dioxide 18, BUN 53, creatinine 1.74, glucose 74 Total protein 7.3  ASSESSMENT AND PLAN: 66 yo female with PMH of morbid obesity, hypertension, hyperlipidemia, diabetes, sleep apnea, GERD, anemia who seen in consultation at the request of Dr. Renae Gloss for evaluation of upper abdominal pain.  1.  Upper abd pain -- the etiology of her upper abdominal pain is unclear.  I recommended a contrast enhanced CT of the abdomen and pelvis for further characterization. Given that it relates to moving, it may be musculoskeletal type pain, and the CT should help exclude other abdominal pathology. It is possible that gastroparesis could be at play given her history of diabetes, but her pain does not always relate to eating making this less likely.  See below  2.  BRBPR -- she has not had colorectal cancer screening in greater than 10 years, and given her rectal bleeding I recommended colonoscopy. We discussed the test including the risks and benefits and she is agreeable to proceed.  Given her BMI this will need to be performed in hospital setting as an outpatient.  Given her mild issues with constipation and occasional bloating, I recommend a trial of Align one capsule daily.  3.  Anemia -- she has a normocytic anemia, and I'll recheck CBC and ferritin today.

## 2012-08-14 ENCOUNTER — Ambulatory Visit (INDEPENDENT_AMBULATORY_CARE_PROVIDER_SITE_OTHER)
Admission: RE | Admit: 2012-08-14 | Discharge: 2012-08-14 | Disposition: A | Discharge status: Home / Self Care | Payer: Medicare Other | Source: Ambulatory Visit | Attending: Internal Medicine | Admitting: Internal Medicine

## 2012-08-14 DIAGNOSIS — R1012 Left upper quadrant pain: Secondary | ICD-10-CM

## 2012-08-14 DIAGNOSIS — R1011 Right upper quadrant pain: Secondary | ICD-10-CM

## 2012-08-14 MED ORDER — IOHEXOL 300 MG/ML  SOLN
80.0000 mL | Freq: Once | INTRAMUSCULAR | Status: AC | PRN
  Administered 2012-08-14: 80 mL via INTRAVENOUS

## 2012-08-16 ENCOUNTER — Encounter: Payer: Self-pay | Admitting: Cardiovascular Disease

## 2012-08-16 DIAGNOSIS — R002 Palpitations: Secondary | ICD-10-CM

## 2012-08-27 HISTORY — PX: CATARACT EXTRACTION W/ INTRAOCULAR LENS IMPLANT: SHX1309

## 2012-08-29 ENCOUNTER — Encounter (HOSPITAL_BASED_OUTPATIENT_CLINIC_OR_DEPARTMENT_OTHER): Payer: Medicare Other | Attending: Plastic Surgery

## 2012-09-03 ENCOUNTER — Encounter (HOSPITAL_COMMUNITY): Payer: Self-pay | Admitting: *Deleted

## 2012-09-03 ENCOUNTER — Encounter (HOSPITAL_COMMUNITY): Payer: Self-pay | Admitting: Pharmacy Technician

## 2012-09-03 NOTE — Pre-Procedure Instructions (Signed)
Your procedure is scheduled JX:[id number], September 23, 2012 Report to [facility] Admitting at:1030 Call this number if you have problems morning of your procedure:[phone]  Follow all bowel prep instructions per your doctor's orders.  Do not eat or drink anything after midnight the night before your procedure. You may brush your teeth, rinse out your mouth, but no water, no food, no chewing gum, no mints, no candies, no chewing tobacco.     Take these medicines the morning of your procedure with A SIP OF WATER:Metoprolol,Isosorbide,Allopurinol,Neurontin,Nexium,Oxycontin and Oxycodone if needed    Please make arrangements for a responsible person to drive you home after the procedure. You cannot go home by cab/taxi. We recommend you have someone with you at home the first 24 hours after your procedure. Driver for procedure is [facility] [phone]  LEAVE ALL VALUABLES, JEWELRY, BILLFOLD AT HOME.  NO DENTURES, CONTACT LENSES ALLOWED IN THE ENDOSCOPY ROOM.   YOU MAY WEAR DEODORANT, PLEASE REMOVE ALL JEWELRY, WATCHES RINGS, BODY PIERCINGS AND LEAVE AT HOME.   WOMEN: NO MAKE-UP, LOTIONS PERFUMES

## 2012-09-17 ENCOUNTER — Telehealth: Payer: Self-pay | Admitting: *Deleted

## 2012-09-17 ENCOUNTER — Telehealth: Payer: Self-pay | Admitting: Internal Medicine

## 2012-09-17 NOTE — Telephone Encounter (Signed)
ecardio 30 day event results given, SR WITH IVCD, WITH OCCASIONAL EPISODES OF SINUS TACH. TOLD PT TO F/U WITH HER PCP, NO FURTHER PLAN AT THIS TIME,  PT AGREED TO PLAN.

## 2012-09-18 MED ORDER — PEG-KCL-NACL-NASULF-NA ASC-C 100 G PO SOLR: 1.0000 | Freq: Once | ORAL | Status: DC

## 2012-09-18 NOTE — Telephone Encounter (Signed)
Spoke to pt. She said she didn't know if the pharmacy had it or not, she did go up there nor did she call them.  She never heard from her Pharmacy saying it was ready.  I told her I would resend the Rx for her since it was originally sent back in December and they may have held it a few days and put it back up.  I asked her to call me if for any reason she was unable to get it.  Pt stated understanding

## 2012-09-22 ENCOUNTER — Telehealth: Payer: Self-pay | Admitting: Gastroenterology

## 2012-09-22 NOTE — Telephone Encounter (Signed)
Called pt to let her know we moved her colonoscopy up to 9:30am tomorrow 09/23/2012; we went over her instructions to make sure she had the times for drinking her prep correct.  Pt stated understanding.

## 2012-09-23 ENCOUNTER — Encounter (HOSPITAL_COMMUNITY): Payer: Self-pay | Admitting: Anesthesiology

## 2012-09-23 ENCOUNTER — Encounter (HOSPITAL_COMMUNITY): Payer: Self-pay

## 2012-09-23 ENCOUNTER — Ambulatory Visit (HOSPITAL_COMMUNITY)
Admission: RE | Admit: 2012-09-23 | Discharge: 2012-09-23 | Disposition: A | Discharge status: Home / Self Care | Payer: Medicare Other | Source: Ambulatory Visit | Attending: Internal Medicine | Admitting: Internal Medicine

## 2012-09-23 ENCOUNTER — Telehealth: Payer: Self-pay | Admitting: Gastroenterology

## 2012-09-23 ENCOUNTER — Encounter (HOSPITAL_COMMUNITY)
Admission: RE | Disposition: A | Discharge status: Home / Self Care | Payer: Self-pay | Source: Ambulatory Visit | Attending: Internal Medicine

## 2012-09-23 ENCOUNTER — Ambulatory Visit (HOSPITAL_COMMUNITY): Payer: Medicare Other | Admitting: Anesthesiology

## 2012-09-23 DIAGNOSIS — I509 Heart failure, unspecified: Secondary | ICD-10-CM | POA: Insufficient documentation

## 2012-09-23 DIAGNOSIS — K648 Other hemorrhoids: Secondary | ICD-10-CM | POA: Insufficient documentation

## 2012-09-23 DIAGNOSIS — E119 Type 2 diabetes mellitus without complications: Secondary | ICD-10-CM | POA: Insufficient documentation

## 2012-09-23 DIAGNOSIS — I10 Essential (primary) hypertension: Secondary | ICD-10-CM | POA: Insufficient documentation

## 2012-09-23 DIAGNOSIS — K635 Polyp of colon: Secondary | ICD-10-CM

## 2012-09-23 DIAGNOSIS — G4730 Sleep apnea, unspecified: Secondary | ICD-10-CM | POA: Insufficient documentation

## 2012-09-23 DIAGNOSIS — K5730 Diverticulosis of large intestine without perforation or abscess without bleeding: Secondary | ICD-10-CM | POA: Insufficient documentation

## 2012-09-23 DIAGNOSIS — R1011 Right upper quadrant pain: Secondary | ICD-10-CM

## 2012-09-23 DIAGNOSIS — D371 Neoplasm of uncertain behavior of stomach: Secondary | ICD-10-CM | POA: Insufficient documentation

## 2012-09-23 DIAGNOSIS — K219 Gastro-esophageal reflux disease without esophagitis: Secondary | ICD-10-CM | POA: Insufficient documentation

## 2012-09-23 DIAGNOSIS — E785 Hyperlipidemia, unspecified: Secondary | ICD-10-CM | POA: Insufficient documentation

## 2012-09-23 DIAGNOSIS — Z1211 Encounter for screening for malignant neoplasm of colon: Secondary | ICD-10-CM

## 2012-09-23 DIAGNOSIS — D126 Benign neoplasm of colon, unspecified: Secondary | ICD-10-CM

## 2012-09-23 DIAGNOSIS — D378 Neoplasm of uncertain behavior of other specified digestive organs: Secondary | ICD-10-CM | POA: Insufficient documentation

## 2012-09-23 DIAGNOSIS — R1012 Left upper quadrant pain: Secondary | ICD-10-CM

## 2012-09-23 HISTORY — DX: Lymphedema, not elsewhere classified: I89.0

## 2012-09-23 HISTORY — PX: COLONOSCOPY WITH PROPOFOL: SHX5780

## 2012-09-23 LAB — GLUCOSE, CAPILLARY
Glucose-Capillary: 61 mg/dL — ABNORMAL LOW (ref 70–99)
Glucose-Capillary: 74 mg/dL (ref 70–99)

## 2012-09-23 SURGERY — COLONOSCOPY WITH PROPOFOL
Anesthesia: Monitor Anesthesia Care

## 2012-09-23 MED ORDER — FENTANYL CITRATE 0.05 MG/ML IJ SOLN
INTRAMUSCULAR | Status: DC | PRN
  Administered 2012-09-23 (×2): 50 ug via INTRAVENOUS
  Administered 2012-09-23: 25 ug via INTRAVENOUS

## 2012-09-23 MED ORDER — PROPOFOL 10 MG/ML IV EMUL
INTRAVENOUS | Status: DC | PRN
  Administered 2012-09-23: 75 ug/kg/min via INTRAVENOUS

## 2012-09-23 MED ORDER — DEXTROSE 50 % IV SOLN
1.0000 | Freq: Once | INTRAVENOUS | Status: AC
  Administered 2012-09-23: 25 mL via INTRAVENOUS
  Filled 2012-09-23: qty 50

## 2012-09-23 MED ORDER — KETAMINE HCL 10 MG/ML IJ SOLN
INTRAMUSCULAR | Status: DC | PRN
  Administered 2012-09-23 (×2): 10 mg via INTRAVENOUS

## 2012-09-23 MED ORDER — LACTATED RINGERS IV SOLN
INTRAVENOUS | Status: DC | PRN
  Administered 2012-09-23: 08:00:00 via INTRAVENOUS

## 2012-09-23 MED ORDER — KETAMINE HCL 10 MG/ML IJ SOLN
INTRAMUSCULAR | Status: DC | PRN
  Administered 2012-09-23: 5 mg via INTRAVENOUS

## 2012-09-23 MED ORDER — MIDAZOLAM HCL 5 MG/5ML IJ SOLN
INTRAMUSCULAR | Status: DC | PRN
  Administered 2012-09-23: 1 mg via INTRAVENOUS
  Administered 2012-09-23: 0.5 mg via INTRAVENOUS

## 2012-09-23 MED ORDER — SODIUM CHLORIDE 0.9 % IV SOLN: INTRAVENOUS | Status: DC

## 2012-09-23 MED ORDER — ONDANSETRON HCL 4 MG/2ML IJ SOLN
INTRAMUSCULAR | Status: DC | PRN
  Administered 2012-09-23 (×2): 2 mg via INTRAVENOUS

## 2012-09-23 SURGICAL SUPPLY — 22 items

## 2012-09-23 NOTE — Transfer of Care (Signed)
Immediate Anesthesia Transfer of Care Note  Patient: Kelly [name]  Procedure(s) Performed: Procedure(s) (LRB) with comments: COLONOSCOPY WITH PROPOFOL (N/A)  Patient Location: PACU  Anesthesia Type:MAC  Level of Consciousness: awake, oriented and patient cooperative  Airway & Oxygen Therapy: Patient Spontanous Breathing and Patient connected to nasal cannula oxygen  Post-op Assessment: Report given to PACU RN and Post -op Vital signs reviewed and stable  Post vital signs: Reviewed and stable  Complications: No apparent anesthesia complications

## 2012-09-23 NOTE — Telephone Encounter (Signed)
LVM for pt saying her procedure will be at 8:15am to arrive at [facility]. At 7:15am and if she had any questions or for any reason could not make this time change to call me ASAP

## 2012-09-23 NOTE — Addendum Note (Signed)
Addendum  created 09/23/12 1436 by Valeda Malm, CRNA   Modules edited:Charges VN

## 2012-09-23 NOTE — Anesthesia Preprocedure Evaluation (Addendum)
Anesthesia Evaluation  Patient identified by MRN, date of birth, ID band Patient awake    Reviewed: Allergy & Precautions, H&P , NPO status , Patient's Chart, lab work & pertinent test results, reviewed documented beta blocker date and time   Airway Mallampati: II TM Distance: >3 FB Neck ROM: Full    Dental  (+) Dental Advisory Given, Partial Lower and Partial Upper   Pulmonary sleep apnea , former smoker,  breath sounds clear to auscultation  Pulmonary exam normal       Cardiovascular hypertension, Pt. on medications and Pt. on home beta blockers +CHF Rhythm:Regular Rate:Normal     Neuro/Psych  Headaches, PSYCHIATRIC DISORDERS Depression negative psych ROS   GI/Hepatic Neg liver ROS, GERD-  Medicated,  Endo/Other  diabetes, Type 2, Insulin Dependent and Oral Hypoglycemic AgentsMorbid obesity  Renal/GU negative Renal ROS     Musculoskeletal negative musculoskeletal ROS (+)   Abdominal (+) + obese,   Peds  Hematology  (+) Blood dyscrasia, anemia ,   Anesthesia Other Findings   Reproductive/Obstetrics                         Anesthesia Physical Anesthesia Plan  ASA: IV  Anesthesia Plan: MAC   Post-op Pain Management:    Induction: Intravenous  Airway Management Planned: Simple Face Mask  Additional Equipment:   Intra-op Plan:   Post-operative Plan:   Informed Consent: I have reviewed the patients History and Physical, chart, labs and discussed the procedure including the risks, benefits and alternatives for the proposed anesthesia with the patient or authorized representative who has indicated his/her understanding and acceptance.   Dental advisory given  Plan Discussed with: CRNA  Anesthesia Plan Comments:         Anesthesia Quick Evaluation

## 2012-09-23 NOTE — Addendum Note (Signed)
Addendum  created 09/23/12 1430 by Valeda Malm, CRNA   Modules edited:Charges VN

## 2012-09-23 NOTE — Anesthesia Postprocedure Evaluation (Signed)
Anesthesia Post Note  Patient: Kelly [name]  Procedure(s) Performed: Procedure(s) (LRB): COLONOSCOPY WITH PROPOFOL (N/A)  Anesthesia type: MAC  Patient location: PACU  Post pain: Pain level controlled  Post assessment: Post-op Vital signs reviewed  Last Vitals: BP 142/77  Pulse 85  Temp 37.1 C (Oral)  Resp 9  Ht 5\' 6"  (1.676 m)  Wt 379 lb (171.913 kg)  BMI 61.17 kg/m2  SpO2 98%  Post vital signs: Reviewed  Level of consciousness: awake  Complications: No apparent anesthesia complications

## 2012-09-23 NOTE — H&P (Signed)
SUBJECTIVE: HPI Kelly [name] is a 67 yo female with PMH of morbid obesity, hypertension, hyperlipidemia, diabetes, sleep apnea, GERD, anemia who presents for screening colonoscopy. She has a hx of recent BRBPR and last colonoscopy > 10 yrs ago.    Remaining history of present illness taken from clinic note dated 07/2012... The patient reports that she has a history of upper abdominal pain, primarily left upper quadrant discomfort which seemed to start several years ago. She reports this was present for some time and then went away, and that she did not think about it much. Then over the last few months she reports the pain has returned. She reports that his abdominal soreness and discomfort which is at times palpable. She notices it most in her left upper quadrant and she reports feeling a "knot" in this location. She reports it comes and goes throughout the day, and that she is able to "push on it" or massage it until it "pops" and then resolves. She specifically reports it does not relate to eating or bowel movement. It is worse with bending over or twisting. She denies nausea, vomiting, heartburn, dysphagia or odynophagia. Her bowel habits are regular and this is been long-standing. She's had no diarrhea. No melena. She reports at times constipation and her stools vary from daily, to every other day, every 3-4 days. She did have a single isolated episode of painless bright red rectal bleeding about 4 weeks ago. This has not recurred. She does recall a colonoscopy performed about 10 years ago at [facility], which reportedly was normal. She also recalls an EGD performed by Dr. Madilyn Fireman in evaluation of her upper abdominal pain, which she recalls was normal. She denies previous abdominal surgery. She does report occasional bloating. Appetite seems to fluctuate. She feels her weight is stable though today he reports the scales indicate she has gained weight.  She reports she is in the process and very much  wants to pursue gastric bypass surgery. Apparently this is ongoing at [facility]   Review of Systems  As per history of present illness, otherwise negative   Past Medical History  Diagnosis Date  . Hypertension   . Diabetes mellitus   . Hyperlipidemia   . CHF (congestive heart failure)   . GERD (gastroesophageal reflux disease)   . Depression   . Sleep apnea     doesn't use cpap machine or 02 at night  . Headache     Migraines  . Arthritis     knees  . Anemia     Had blood transfusion x 2 in yhe 90s  . Lymphedema of leg     Current Facility-Administered Medications  Medication Dose Route Frequency Provider Last Rate Last Dose  . 0.9 %  sodium chloride infusion   Intravenous Continuous Beverley Fiedler, MD       Facility-Administered Medications Ordered in Other Encounters  Medication Dose Route Frequency Provider Last Rate Last Dose  . lactated ringers infusion    Continuous PRN Valeda Malm, CRNA        Allergies  Allergen Reactions  . Penicillins Hives    Family History  Problem Relation Age of Onset  . Emphysema Father   . Asthma Brother   . Heart disease Mother   . Stomach cancer Brother   . Heart disease Maternal Grandmother   . Diabetes Mother   . Diabetes Sister     History  Substance Use Topics  . Smoking  status: Former Smoker    Types: Cigarettes    Quit date: 08/27/1981  . Smokeless tobacco: Never Used  . Alcohol Use: No    OBJECTIVE: BP 166/126  Pulse 85  Temp 97.5 F (36.4 C) (Oral)  Resp 15  Ht 5\' 6"  (1.676 m)  Wt 379 lb (171.913 kg)  BMI 61.17 kg/m2  SpO2 100% Gen: awake, alert, NAD HEENT: anicteric, op clear CV: RRR, no mrg Pulm: CTA b/l Abd: soft, NT/ND, +BS throughout Ext: no c/c/e Neuro: nonfocal  Labs and Imaging -- CBC    Component Value Date/Time   WBC 5.9 08/12/2012 1101   RBC 4.00 08/12/2012 1101   HGB 10.7* 08/12/2012 1101   HCT 33.6* 08/12/2012 1101   PLT 227.0 08/12/2012 1101    MCV 83.9 08/12/2012 1101   MCH 26.4 03/22/2012 0215   MCHC 31.9 08/12/2012 1101   RDW 18.1* 08/12/2012 1101   LYMPHSABS 1.0 08/12/2012 1101   MONOABS 0.4 08/12/2012 1101   EOSABS 0.4 08/12/2012 1101   BASOSABS 0.0 08/12/2012 1101    CMP     Component Value Date/Time   NA 140 08/12/2012 1101   K 4.7 08/12/2012 1101   CL 108 08/12/2012 1101   CO2 20 08/12/2012 1101   GLUCOSE 76 08/12/2012 1101   BUN 43* 08/12/2012 1101   CREATININE 1.6* 08/12/2012 1101   CALCIUM 8.9 08/12/2012 1101   PROT 8.3 08/12/2012 1101   ALBUMIN 3.3* 08/12/2012 1101   AST 26 08/12/2012 1101   ALT 27 08/12/2012 1101   ALKPHOS 157* 08/12/2012 1101   BILITOT 0.6 08/12/2012 1101   GFRNONAA 29* 03/22/2012 0215   GFRAA 33* 03/22/2012 0215   ASSESSMENT AND PLAN: 67 yo female with PMH of morbid obesity, hypertension, hyperlipidemia, diabetes, sleep apnea, GERD, anemia who presents for screening colonoscopy.  1.  Screening colon/BRBPR -- plan for colonoscopy today with MAC.  The nature of the procedure, as well as the risks, benefits, and alternatives were carefully and thoroughly reviewed with the patient. Ample time for discussion and questions allowed. The patient understood, was satisfied, and agreed to proceed.

## 2012-09-23 NOTE — Op Note (Signed)
[facility] [address] [place], [address]   COLONOSCOPY PROCEDURE REPORT  PATIENT: Kelly, [name]  MR#: [id number] BIRTHDATE: 11/15/1945 , 66  yrs. old GENDER: Female ENDOSCOPIST: Beverley Fiedler, MD REFERRED XB:[id number], Kim PROCEDURE DATE:  09/23/2012 PROCEDURE:   Colonoscopy with cold biopsy polypectomy ASA CLASS:   Class III INDICATIONS:average risk screening, Last colonoscopy performed > 10 years ago, and Rectal Bleeding. MEDICATIONS: MAC sedation, administered by CRNA and See Anesthesia Report.  DESCRIPTION OF PROCEDURE:   After the risks benefits and alternatives of the procedure were thoroughly explained, informed consent was obtained.  A digital rectal exam revealed no rectal mass.   The     endoscope was introduced through the anus and advanced to the terminal ileum which was intubated for a short distance. No adverse events experienced.   The quality of the prep was good, using MoviPrep  The instrument was then slowly withdrawn as the colon was fully examined.   COLON FINDINGS: A sessile polyp measuring 3 mm in size was found at the hepatic flexure.  A polypectomy was performed with cold forceps.  The resection was complete and the polyp tissue was completely retrieved.   Mild diverticulosis was noted in the ascending colon and sigmoid colon.   Small internal hemorrhoids were found.  Retroflexed views revealed internal hemorrhoids. The scope was withdrawn and the procedure completed. COMPLICATIONS: There were no complications.  ENDOSCOPIC IMPRESSION: 1.   Sessile polyp measuring 3 mm in size was found at the hepatic flexure; polypectomy was performed with cold forceps 2.   Mild diverticulosis was noted in the ascending colon and sigmoid colon 3.   Small internal hemorrhoids  RECOMMENDATIONS: 1.  Await pathology results 2.  High fiber diet 3.  If the polyp removed today is proven to be an adenomatous (pre-cancerous) polyp, you will need a  repeat colonoscopy in 5 years.  Otherwise you should continue to follow colorectal cancer screening guidelines for "routine risk" patients with colonoscopy in 10 years.  You will receive a letter within 1-2 weeks with the results of your biopsy as well as final recommendations.  Please call my office if you have not received a letter after 3 weeks. 4.  Hydrocortisone suppository 25 mg BID can be used for internal hemorrhoids if bleeding becomes more problematic   eSigned:  Beverley Fiedler, MD 09/23/2012 10:33 AM  cc: Kellie Shropshire, MD and The Patient

## 2012-09-24 ENCOUNTER — Telehealth: Payer: Self-pay | Admitting: *Deleted

## 2012-09-24 ENCOUNTER — Encounter (HOSPITAL_COMMUNITY): Payer: Self-pay | Admitting: Internal Medicine

## 2012-09-24 DIAGNOSIS — E669 Obesity, unspecified: Secondary | ICD-10-CM

## 2012-09-24 NOTE — Telephone Encounter (Signed)
Message copied by Florene Glen on Wed Sep 24, 2012 11:28 AM ------      Message from: Beverley Fiedler      Created: Tue Sep 23, 2012  9:45 AM      Regarding: Referral       Please refer to Dr. Wenda Low for evaluation of gastric bypass.  Thanks

## 2012-09-24 NOTE — Telephone Encounter (Signed)
Sent request for an appt to [facility]

## 2012-09-24 NOTE — Telephone Encounter (Signed)
Informed pt she needs to attend a weight loss seminar at [facility] as the 1st step. She will call [phone] to sign up and get details.

## 2012-09-24 NOTE — Telephone Encounter (Signed)
Message copied by Florene Glen on Wed Sep 24, 2012  1:18 PM ------      Message from: Zacarias Pontes      Created: Wed Sep 24, 2012 12:08 PM        1st step.Marland KitchenMarland KitchenPt has to attend weight loss seminar held at [employer]  ..the patient calls to make this apt at [phone] then follow instructions given by bariatric coordinator at seminar      ----- Message -----         From: Linna Hoff, RN         Sent: 09/24/2012  11:29 AM           To: Zacarias Pontes            Can we have an appt with Dr Wenda Low for eval of gastric bypass surgery? Thanks.

## 2012-09-26 ENCOUNTER — Encounter: Payer: Self-pay | Admitting: Internal Medicine

## 2012-11-17 ENCOUNTER — Emergency Department (HOSPITAL_COMMUNITY): Payer: Medicare Other

## 2012-11-17 ENCOUNTER — Inpatient Hospital Stay (HOSPITAL_COMMUNITY)
Admission: EM | Admit: 2012-11-17 | Discharge: 2012-11-20 | DRG: 292 | Disposition: A | Discharge status: Home / Self Care | Payer: Medicare Other | Attending: Internal Medicine | Admitting: Internal Medicine

## 2012-11-17 ENCOUNTER — Encounter (HOSPITAL_COMMUNITY): Payer: Self-pay | Admitting: Emergency Medicine

## 2012-11-17 DIAGNOSIS — N179 Acute kidney failure, unspecified: Secondary | ICD-10-CM

## 2012-11-17 DIAGNOSIS — E1169 Type 2 diabetes mellitus with other specified complication: Secondary | ICD-10-CM | POA: Diagnosis present

## 2012-11-17 DIAGNOSIS — R600 Localized edema: Secondary | ICD-10-CM

## 2012-11-17 DIAGNOSIS — I872 Venous insufficiency (chronic) (peripheral): Secondary | ICD-10-CM | POA: Diagnosis present

## 2012-11-17 DIAGNOSIS — E1149 Type 2 diabetes mellitus with other diabetic neurological complication: Secondary | ICD-10-CM | POA: Diagnosis present

## 2012-11-17 DIAGNOSIS — N189 Chronic kidney disease, unspecified: Secondary | ICD-10-CM

## 2012-11-17 DIAGNOSIS — E119 Type 2 diabetes mellitus without complications: Secondary | ICD-10-CM

## 2012-11-17 DIAGNOSIS — I83009 Varicose veins of unspecified lower extremity with ulcer of unspecified site: Secondary | ICD-10-CM

## 2012-11-17 DIAGNOSIS — L89109 Pressure ulcer of unspecified part of back, unspecified stage: Secondary | ICD-10-CM | POA: Diagnosis present

## 2012-11-17 DIAGNOSIS — D649 Anemia, unspecified: Secondary | ICD-10-CM

## 2012-11-17 DIAGNOSIS — Z794 Long term (current) use of insulin: Secondary | ICD-10-CM

## 2012-11-17 DIAGNOSIS — I5033 Acute on chronic diastolic (congestive) heart failure: Secondary | ICD-10-CM

## 2012-11-17 DIAGNOSIS — R609 Edema, unspecified: Secondary | ICD-10-CM

## 2012-11-17 DIAGNOSIS — I129 Hypertensive chronic kidney disease with stage 1 through stage 4 chronic kidney disease, or unspecified chronic kidney disease: Secondary | ICD-10-CM | POA: Diagnosis present

## 2012-11-17 DIAGNOSIS — N039 Chronic nephritic syndrome with unspecified morphologic changes: Secondary | ICD-10-CM | POA: Diagnosis present

## 2012-11-17 DIAGNOSIS — M109 Gout, unspecified: Secondary | ICD-10-CM | POA: Diagnosis present

## 2012-11-17 DIAGNOSIS — I890 Lymphedema, not elsewhere classified: Secondary | ICD-10-CM

## 2012-11-17 DIAGNOSIS — Z91199 Patient's noncompliance with other medical treatment and regimen due to unspecified reason: Secondary | ICD-10-CM

## 2012-11-17 DIAGNOSIS — N183 Chronic kidney disease, stage 3 unspecified: Secondary | ICD-10-CM | POA: Diagnosis present

## 2012-11-17 DIAGNOSIS — G4733 Obstructive sleep apnea (adult) (pediatric): Secondary | ICD-10-CM | POA: Diagnosis present

## 2012-11-17 DIAGNOSIS — I10 Essential (primary) hypertension: Secondary | ICD-10-CM | POA: Diagnosis present

## 2012-11-17 DIAGNOSIS — D631 Anemia in chronic kidney disease: Secondary | ICD-10-CM | POA: Diagnosis present

## 2012-11-17 DIAGNOSIS — Z9119 Patient's noncompliance with other medical treatment and regimen: Secondary | ICD-10-CM

## 2012-11-17 DIAGNOSIS — Z6841 Body Mass Index (BMI) 40.0 and over, adult: Secondary | ICD-10-CM

## 2012-11-17 DIAGNOSIS — L97909 Non-pressure chronic ulcer of unspecified part of unspecified lower leg with unspecified severity: Secondary | ICD-10-CM | POA: Diagnosis present

## 2012-11-17 DIAGNOSIS — L8990 Pressure ulcer of unspecified site, unspecified stage: Secondary | ICD-10-CM | POA: Diagnosis present

## 2012-11-17 LAB — CBC
HCT: 30.4 % — ABNORMAL LOW (ref 36.0–46.0)
MCHC: 31.3 g/dL (ref 30.0–36.0)
Platelets: 234 10*3/uL (ref 150–400)
RDW: 15.7 % — ABNORMAL HIGH (ref 11.5–15.5)
WBC: 5.5 10*3/uL (ref 4.0–10.5)

## 2012-11-17 LAB — BASIC METABOLIC PANEL
BUN: 53 mg/dL — ABNORMAL HIGH (ref 6–23)
Chloride: 107 mEq/L (ref 96–112)
Creatinine, Ser: 1.84 mg/dL — ABNORMAL HIGH (ref 0.50–1.10)
GFR calc Af Amer: 32 mL/min — ABNORMAL LOW (ref >90)
GFR calc non Af Amer: 28 mL/min — ABNORMAL LOW (ref >90)

## 2012-11-17 LAB — POCT I-STAT TROPONIN I: Troponin i, poc: 0.01 ng/mL (ref 0.00–0.08)

## 2012-11-17 LAB — D-DIMER, QUANTITATIVE: D-Dimer, Quant: 1.29 ug/mL-FEU — ABNORMAL HIGH (ref 0.00–0.48)

## 2012-11-17 LAB — PRO B NATRIURETIC PEPTIDE: Pro B Natriuretic peptide (BNP): 565.7 pg/mL — ABNORMAL HIGH (ref 0–125)

## 2012-11-17 MED ORDER — FUROSEMIDE 10 MG/ML IJ SOLN
80.0000 mg | Freq: Once | INTRAMUSCULAR | Status: AC
  Administered 2012-11-17: 80 mg via INTRAVENOUS
  Filled 2012-11-17: qty 8

## 2012-11-17 MED ORDER — ENOXAPARIN SODIUM 150 MG/ML ~~LOC~~ SOLN
170.0000 mg | Freq: Two times a day (BID) | SUBCUTANEOUS | Status: DC
  Administered 2012-11-17 – 2012-11-18 (×2): 170 mg via SUBCUTANEOUS
  Filled 2012-11-17 (×4): qty 2

## 2012-11-17 MED ORDER — MORPHINE SULFATE 4 MG/ML IJ SOLN
4.0000 mg | Freq: Once | INTRAMUSCULAR | Status: AC
  Administered 2012-11-17: 4 mg via INTRAVENOUS
  Filled 2012-11-17: qty 1

## 2012-11-17 NOTE — Progress Notes (Signed)
ANTICOAGULATION CONSULT NOTE - Initial Consult  Pharmacy Consult for Lovenox Indication: ACS  Allergies  Allergen Reactions  . Penicillins Hives    Patient Measurements: Height: 5\' 5"  (165.1 cm) Weight: 379 lb 2 oz (171.97 kg) IBW/kg (Calculated) : 57 Adjusted wt 103kg  Vital Signs: Temp: 98.2 F (36.8 C) (03/24 1633) Temp src: Oral (03/24 1633) BP: 153/49 mmHg (03/24 1930) Pulse Rate: 71 (03/24 1930)  Labs:  Recent Labs  11/17/12 1725  HGB 9.5*  HCT 30.4*  PLT 234  CREATININE 1.84*    Estimated Creatinine Clearance: 49.6 ml/min (by C-G formula based on Cr of 1.84).   Medical History: Past Medical History  Diagnosis Date  . Hypertension   . Diabetes mellitus   . Hyperlipidemia   . CHF (congestive heart failure)   . GERD (gastroesophageal reflux disease)   . Depression   . Sleep apnea     doesn't use cpap machine or 02 at night  . Headache     Migraines  . Arthritis     knees  . Anemia     Had blood transfusion x 2 in yhe 90s  . Lymphedema of leg     Medications:   (Not in a hospital admission) Scheduled:  . furosemide  80 mg Intravenous Once  . [COMPLETED]  morphine injection  4 mg Intravenous Once   Infusions:    Assessment: 66 YOF admitted w/ chest pain. Pharmacy asked to dose Lovenox  Goal of Therapy:  Anti-Xa level 0.6-1.2 units/ml 4hrs after LMWH dose given Monitor platelets by anticoagulation protocol: Yes   Plan:  Lovenox 170mg  SQ q12h LMWH level at steady state Adjust dose as necessary  Gwen Her PharmD  [phone] 11/17/2012 9:40 PM

## 2012-11-17 NOTE — ED Provider Notes (Signed)
History    CSN: [id number] Arrival date & time 11/17/12  1604 First MD Initiated Contact with Patient 11/17/12 1715      Chief Complaint  Patient presents with  . Shortness of Breath  . Leg Swelling    HPI Pt started having swelling in her abdomens and leg over the last week.  Pt had her diuretics increased last week but that has not helped and the symptoms have increased.  She continues to have more pain and swelling in her legs.    Pt has also had cellulitis in the past and she feels that this might be occurring as well.  Her legs feel swollen and heavy.  She started having some slight discomfort in her chest yesterday.  She feels a little short of breath but not particularly worse with lying flat.  Only with exertion.  No history of DVT or PE.  SHe saw her nephrologist today who told her to come to the ED for possible admission. Past Medical History  Diagnosis Date  . Hypertension   . Diabetes mellitus   . Hyperlipidemia   . CHF (congestive heart failure)   . GERD (gastroesophageal reflux disease)   . Depression   . Sleep apnea     doesn't use cpap machine or 02 at night  . Headache     Migraines  . Arthritis     knees  . Anemia     Had blood transfusion x 2 in yhe 90s  . Lymphedema of leg     Past Surgical History  Procedure Laterality Date  . Rotator cuff repair      right  . Knee arthroscopy      right  . Temple biopsy    . Colonoscopy with propofol  09/23/2012    Procedure: COLONOSCOPY WITH PROPOFOL;  Surgeon: Beverley Fiedler, MD;  Location: [facility];  Service: Gastroenterology;  Laterality: N/A;    Family History  Problem Relation Age of Onset  . Emphysema Father   . Asthma Brother   . Heart disease Mother   . Stomach cancer Brother   . Heart disease Maternal Grandmother   . Diabetes Mother   . Diabetes Sister     History  Substance Use Topics  . Smoking status: Former Smoker    Types: Cigarettes    Quit date: 08/27/1981  . Smokeless tobacco:  Never Used  . Alcohol Use: No    OB History   Grav Para Term Preterm Abortions TAB SAB Ect Mult Living                  Review of Systems  All other systems reviewed and are negative.    Allergies  Penicillins  Home Medications   Current Outpatient Rx  Name  Route  Sig  Dispense  Refill  . allopurinol (ZYLOPRIM) 100 MG tablet   Oral   Take 100 mg by mouth 2 (two) times daily.         Marland Kitchen aspirin 325 MG EC tablet   Oral   Take 325 mg by mouth daily.         Marland Kitchen atorvastatin (LIPITOR) 40 MG tablet   Oral   Take 40 mg by mouth at bedtime.         . colchicine 0.6 MG tablet   Oral   Take 0.6 mg by mouth 2 (two) times daily.         Marland Kitchen esomeprazole (NEXIUM) 40 MG capsule   Oral  Take 40 mg by mouth daily before breakfast.         . furosemide (LASIX) 80 MG tablet   Oral   Take 80 mg by mouth daily.         Marland Kitchen gabapentin (NEURONTIN) 300 MG capsule   Oral   Take 300 mg by mouth 3 (three) times daily.         . hydrALAZINE (APRESOLINE) 25 MG tablet   Oral   Take 25 mg by mouth 3 (three) times daily.         . insulin glargine (LANTUS) 100 UNIT/ML injection   Subcutaneous   Inject 50 Units into the skin at bedtime. As directed         . isosorbide mononitrate (ISMO,MONOKET) 10 MG tablet   Oral   Take 10 mg by mouth 2 (two) times daily.         Marland Kitchen losartan (COZAAR) 100 MG tablet   Oral   Take 100 mg by mouth every morning.          . metoprolol tartrate (LOPRESSOR) 25 MG tablet   Oral   Take 12.5 mg by mouth 2 (two) times daily.         Marland Kitchen oxyCODONE (OXY IR/ROXICODONE) 5 MG immediate release tablet   Oral   Take 5-10 mg by mouth every 4 (four) hours as needed. For pain         . OxyCODONE (OXYCONTIN) 10 mg T12A   Oral   Take 10 mg by mouth every 12 (twelve) hours.         . Vitamin D, Ergocalciferol, (DRISDOL) 50000 UNITS CAPS   Oral   Take 50,000 Units by mouth every Thursday.         . zolpidem (AMBIEN) 5 MG tablet    Oral   Take 5 mg by mouth at bedtime as needed. For sleep         . EXPIRED: hydrALAZINE (APRESOLINE) 25 MG tablet   Oral   Take 1 tablet (25 mg total) by mouth 3 (three) times daily.   60 tablet   2     BP 153/49  Pulse 71  Temp(Src) 98.2 F (36.8 C) (Oral)  Resp 24  Ht 5\' 5"  (1.651 m)  Wt 379 lb 2 oz (171.97 kg)  BMI 63.09 kg/m2  SpO2 97%  Physical Exam  Nursing note and vitals reviewed. Constitutional: No distress.  Morbidly obese   HENT:  Head: Normocephalic and atraumatic.  Right Ear: External ear normal.  Left Ear: External ear normal.  Mouth/Throat: No oropharyngeal exudate.  Eyes: Conjunctivae are normal. Right eye exhibits no discharge. Left eye exhibits no discharge. No scleral icterus.  Neck: Neck supple. No tracheal deviation present.  Cardiovascular: Normal rate, regular rhythm and intact distal pulses.   Pulmonary/Chest: Effort normal and breath sounds normal. No stridor. No respiratory distress. She has no wheezes. She has no rales.  Abdominal: Soft. Bowel sounds are normal. She exhibits no distension. There is no tenderness. There is no rebound and no guarding.  Musculoskeletal: She exhibits edema and tenderness.  Chronic lymphedema primarily in thighs bilaterally greater than distal leg, edema and erythema, strong peripheral pulses.  Neurological: She is alert. She has normal strength. No sensory deficit. Cranial nerve deficit:  no gross defecits noted. She exhibits normal muscle tone. She displays no seizure activity. Coordination normal.  Skin: Skin is warm and dry. No rash noted.  Psychiatric: She has a normal mood and affect.  ED Course  Procedures (including critical care time) EKG Normal sinus rhythm rate 79 Right bundle branch block Normal ST and T waves Normal axis No significant change when compared to prior EKG Labs Reviewed  BASIC METABOLIC PANEL - Abnormal; Notable for the following:    Glucose, Bld 115 (*)    BUN 53 (*)     Creatinine, Ser 1.84 (*)    GFR calc non Af Amer 28 (*)    GFR calc Af Amer 32 (*)    All other components within normal limits  CBC - Abnormal; Notable for the following:    RBC 3.50 (*)    Hemoglobin 9.5 (*)    HCT 30.4 (*)    RDW 15.7 (*)    All other components within normal limits  PRO B NATRIURETIC PEPTIDE - Abnormal; Notable for the following:    Pro B Natriuretic peptide (BNP) 565.7 (*)    All other components within normal limits  D-DIMER, QUANTITATIVE - Abnormal; Notable for the following:    D-Dimer, Quant 1.29 (*)    All other components within normal limits  POCT I-STAT TROPONIN I   Dg Chest 2 View  11/17/2012  *RADIOLOGY REPORT*  Clinical Data: Shortness of breath.  Leg swelling.  CHEST - 2 VIEW  Comparison: 03/21/2012  Findings: Heart size is normal.  There is slight pulmonary vascular prominence but there are no infiltrates or effusions.  No osseous abnormality.  IMPRESSION: Slight pulmonary vascular prominence.   Original Report Authenticated By: Francene Boyers, M.D.      1. Lymphedema       MDM  Patient's exam is not suggestive of cellulitis at this time. She does have chronic lymphedema. She certainly have a component of fluid overload however with her elevated d-dimer I will treat her in for deep venous thrombosis I ordered Lovenox. The patient will be admitted to the hospital and plan on venous Doppler studies in the morning when the technician is available         Celene Kras, MD 11/17/12 2141

## 2012-11-17 NOTE — H&P (Signed)
[facility] History and Physical  Kelly [name] ZOX:[id number] DOB: Aug 25, 1946 DOA: 11/17/2012  Referring physician: Patsy Lager.  PCP: Alva Garnet., MD  Specialists: Dr. Kathe Mariner. Nephrologist.  Chief Complaint: Lower extremity edema.  HPI: Kelly [name] is a 67 y.o. female with history of chronic kidney disease, hypertension, gout, diabetes mellitus type 2, obesity hypoventilation syndrome on CPAP not compliant, has been experiencing increasing swelling in the lower extremity particularly the both thighs over the last one week. Patient's Lasix dose was increased from 80 mg daily to twice daily over the last 4 days by her nephrologist. Despite which patient's leg swelling has worsened and painful. Denies any fever chills. Patient also noticed increasing body weight with mild shortness of breath. Denies any chest pain or productive cough. Due to these worsening symptoms patient was advised to go to the ER by her nephrologist. In the ER patient was found to have mildly elevated d-dimer at this time patient has been admitted for further management. Patient was given one dose of Lasix 80 mg IV and started on full dose Lovenox for possible DVT.  Review of Systems: As presented in the history of presenting illness, rest negative.  Past Medical History  Diagnosis Date  . Hypertension   . Diabetes mellitus   . Hyperlipidemia   . CHF (congestive heart failure)   . GERD (gastroesophageal reflux disease)   . Depression   . Sleep apnea     doesn't use cpap machine or 02 at night  . Headache     Migraines  . Arthritis     knees  . Anemia     Had blood transfusion x 2 in yhe 90s  . Lymphedema of leg    Past Surgical History  Procedure Laterality Date  . Rotator cuff repair      right  . Knee arthroscopy      right  . Temple biopsy    . Colonoscopy with propofol  09/23/2012    Procedure: COLONOSCOPY WITH PROPOFOL;  Surgeon: Beverley Fiedler, MD;  Location: [facility];  Service:  Gastroenterology;  Laterality: N/A;   Social History:  reports that she quit smoking about 31 years ago. Her smoking use included Cigarettes. She smoked 0.00 packs per day. She has never used smokeless tobacco. She reports that she does not drink alcohol or use illicit drugs. Lives at home. where does patient live-- Not sure. Can patient participate in ADLs?  Allergies  Allergen Reactions  . Penicillins Hives    Family History  Problem Relation Age of Onset  . Emphysema Father   . Asthma Brother   . Heart disease Mother   . Stomach cancer Brother   . Heart disease Maternal Grandmother   . Diabetes Mother   . Diabetes Sister       Prior to Admission medications   Medication Sig Start Date End Date Taking? Authorizing Provider  allopurinol (ZYLOPRIM) 100 MG tablet Take 100 mg by mouth 2 (two) times daily.   Yes Historical Provider, MD  aspirin 325 MG EC tablet Take 325 mg by mouth daily.   Yes Historical Provider, MD  atorvastatin (LIPITOR) 40 MG tablet Take 40 mg by mouth at bedtime.   Yes Historical Provider, MD  colchicine 0.6 MG tablet Take 0.6 mg by mouth 2 (two) times daily.   Yes Historical Provider, MD  esomeprazole (NEXIUM) 40 MG capsule Take 40 mg by mouth daily before breakfast.   Yes Historical Provider, MD  furosemide (LASIX) 80 MG  tablet Take 80 mg by mouth daily.   Yes Historical Provider, MD  gabapentin (NEURONTIN) 300 MG capsule Take 300 mg by mouth 3 (three) times daily.   Yes Historical Provider, MD  hydrALAZINE (APRESOLINE) 25 MG tablet Take 25 mg by mouth 3 (three) times daily.   Yes Historical Provider, MD  insulin glargine (LANTUS) 100 UNIT/ML injection Inject 50 Units into the skin at bedtime. As directed 09/23/11  Yes Hildred Laser, MD  isosorbide mononitrate (ISMO,MONOKET) 10 MG tablet Take 10 mg by mouth 2 (two) times daily.   Yes Historical Provider, MD  losartan (COZAAR) 100 MG tablet Take 100 mg by mouth every morning.    Yes Historical Provider, MD   metoprolol tartrate (LOPRESSOR) 25 MG tablet Take 12.5 mg by mouth 2 (two) times daily.   Yes Historical Provider, MD  oxyCODONE (OXY IR/ROXICODONE) 5 MG immediate release tablet Take 5-10 mg by mouth every 4 (four) hours as needed. For pain   Yes Historical Provider, MD  OxyCODONE (OXYCONTIN) 10 mg T12A Take 10 mg by mouth every 12 (twelve) hours.   Yes Historical Provider, MD  Vitamin D, Ergocalciferol, (DRISDOL) 50000 UNITS CAPS Take 50,000 Units by mouth every Thursday.   Yes Historical Provider, MD  zolpidem (AMBIEN) 5 MG tablet Take 5 mg by mouth at bedtime as needed. For sleep 07/28/12  Yes Historical Provider, MD  hydrALAZINE (APRESOLINE) 25 MG tablet Take 1 tablet (25 mg total) by mouth 3 (three) times daily. 09/23/11 09/22/12  Hildred Laser, MD   Physical Exam: Filed Vitals:   11/17/12 2100 11/17/12 2115 11/17/12 2130 11/17/12 2136  BP: 123/97  141/44   Pulse: 82 78 73   Temp:      TempSrc:      Resp:  20 18   Height:    5\' 5"  (1.651 m)  Weight:    171.97 kg (379 lb 2 oz)  SpO2: 99% 100% 98%      General:  Well-developed and nourished. Morbidly obese.  Eyes: Anicteric no pallor.  ENT: No discharge from the ears eyes nose or mouth.  Neck: No mass felt.  Cardiovascular: S1-S2 heard.  Respiratory: No rhonchi or crepitations.  Abdomen: Soft nontender.  Skin: Patient has chronic wounds of the thighs.  Musculoskeletal: Edema of the thighs which is nonpitting.  Psychiatric: Appears normal.  Neurologic: Alert awake oriented to time place and person. Moves all extremities.  Labs on Admission:  Basic Metabolic Panel:  Recent Labs Lab 11/17/12 1725  NA 139  K 4.7  CL 107  CO2 22  GLUCOSE 115*  BUN 53*  CREATININE 1.84*  CALCIUM 8.9   Liver Function Tests: No results found for this basename: AST, ALT, ALKPHOS, BILITOT, PROT, ALBUMIN,  in the last 168 hours No results found for this basename: LIPASE, AMYLASE,  in the last 168 hours No results found for  this basename: AMMONIA,  in the last 168 hours CBC:  Recent Labs Lab 11/17/12 1725  WBC 5.5  HGB 9.5*  HCT 30.4*  MCV 86.9  PLT 234   Cardiac Enzymes: No results found for this basename: CKTOTAL, CKMB, CKMBINDEX, TROPONINI,  in the last 168 hours  BNP (last 3 results)  Recent Labs  01/04/12 1714 03/21/12 2116 11/17/12 1739  PROBNP 193.9* 407.5* 565.7*   CBG: No results found for this basename: GLUCAP,  in the last 168 hours  Radiological Exams on Admission: Dg Chest 2 View  11/17/2012  *RADIOLOGY REPORT*  Clinical Data: Shortness  of breath.  Leg swelling.  CHEST - 2 VIEW  Comparison: 03/21/2012  Findings: Heart size is normal.  There is slight pulmonary vascular prominence but there are no infiltrates or effusions.  No osseous abnormality.  IMPRESSION: Slight pulmonary vascular prominence.   Original Report Authenticated By: Francene Boyers, M.D.     Assessment/Plan Principal Problem:   Lower extremity edema Active Problems:   HYPERTENSION   Diabetes mellitus, type 2   CKD (chronic kidney disease)   Anemia   1. Lower extremity edema with pain - most likely secondary to fluid accumulation. We will continue with Lasix 80 mg IV every 12. Closely follow intake output and metabolic panel. Further doses of Lasix to be adjusted accordingly. At this time there is concern for possible DVT for which Dopplers of lower extremity has been ordered and patient will be on full dose Lovenox for DVT until Dopplers are done. Since patient is also complaining of pain and has chronic wounds on the posterior aspect of the thigh patient has been placed on doxycycline IV for now and won't even has been consulted but at this time patient does not have any fever or leukocytosis to suggest acute infection but we will closely follow clinically. 2. Chronic kidney disease - see #1. Closely follow intake output. 3. Hypertension - continue home medications. 4. Gout - continue home  medications. 5. Diabetes mellitus type 2 - continue home medications. 6. Chronic anemia probably from renal disease - follow CBC. 7. History of obesity hypoventilation syndrome - noncompliant with CPAP. At this time I have asked respiratory to place patient on CPAP.    Code Status: Full code.  Family Communication: None.  Disposition Plan: Admit to inpatient.    Aldred Mase N. [facility] Pager [phone].  If 7PM-7AM, please contact night-coverage [email or web address] Password [email or web address] 11/17/2012, 10:10 PM

## 2012-11-17 NOTE — ED Notes (Signed)
Pt c/o swelling in abdomen and lower extremities for about a week started taken more Lasix and did not do anything, c/o center chest pain and SOB. Pt states she was a little dizzy today but has gotten better.

## 2012-11-18 ENCOUNTER — Encounter (HOSPITAL_COMMUNITY): Payer: Self-pay | Admitting: *Deleted

## 2012-11-18 DIAGNOSIS — I5033 Acute on chronic diastolic (congestive) heart failure: Secondary | ICD-10-CM | POA: Diagnosis present

## 2012-11-18 DIAGNOSIS — L97909 Non-pressure chronic ulcer of unspecified part of unspecified lower leg with unspecified severity: Secondary | ICD-10-CM | POA: Diagnosis present

## 2012-11-18 DIAGNOSIS — I83009 Varicose veins of unspecified lower extremity with ulcer of unspecified site: Secondary | ICD-10-CM

## 2012-11-18 DIAGNOSIS — I10 Essential (primary) hypertension: Secondary | ICD-10-CM

## 2012-11-18 DIAGNOSIS — I509 Heart failure, unspecified: Secondary | ICD-10-CM

## 2012-11-18 DIAGNOSIS — N179 Acute kidney failure, unspecified: Secondary | ICD-10-CM | POA: Diagnosis present

## 2012-11-18 LAB — GLUCOSE, CAPILLARY
Glucose-Capillary: 47 mg/dL — ABNORMAL LOW (ref 70–99)
Glucose-Capillary: 49 mg/dL — ABNORMAL LOW (ref 70–99)
Glucose-Capillary: 109 mg/dL — ABNORMAL HIGH (ref 70–99)

## 2012-11-18 LAB — CBC
HCT: 28.5 % — ABNORMAL LOW (ref 36.0–46.0)
MCHC: 31.6 g/dL (ref 30.0–36.0)
MCV: 86.1 fL (ref 78.0–100.0)
RDW: 15.9 % — ABNORMAL HIGH (ref 11.5–15.5)

## 2012-11-18 LAB — BASIC METABOLIC PANEL
BUN: 50 mg/dL — ABNORMAL HIGH (ref 6–23)
CO2: 24 mEq/L (ref 19–32)
Chloride: 110 mEq/L (ref 96–112)
Creatinine, Ser: 1.69 mg/dL — ABNORMAL HIGH (ref 0.50–1.10)
GFR calc Af Amer: 36 mL/min — ABNORMAL LOW (ref >90)

## 2012-11-18 LAB — HEMOGLOBIN A1C: Hgb A1c MFr Bld: 6 % — ABNORMAL HIGH (ref <5.7)

## 2012-11-18 LAB — IRON AND TIBC: TIBC: 245 ug/dL — ABNORMAL LOW (ref 250–470)

## 2012-11-18 MED ORDER — ONDANSETRON HCL 4 MG/2ML IJ SOLN: 4.0000 mg | Freq: Four times a day (QID) | INTRAMUSCULAR | Status: DC | PRN

## 2012-11-18 MED ORDER — ZOLPIDEM TARTRATE 5 MG PO TABS: 5.0000 mg | ORAL_TABLET | Freq: Every evening | ORAL | Status: DC | PRN

## 2012-11-18 MED ORDER — INSULIN GLARGINE 100 UNIT/ML ~~LOC~~ SOLN
50.0000 [IU] | Freq: Every day | SUBCUTANEOUS | Status: DC
  Filled 2012-11-18: qty 0.5

## 2012-11-18 MED ORDER — ASPIRIN EC 325 MG PO TBEC
325.0000 mg | DELAYED_RELEASE_TABLET | Freq: Every day | ORAL | Status: DC
  Administered 2012-11-18 – 2012-11-20 (×3): 325 mg via ORAL
  Filled 2012-11-18 (×3): qty 1

## 2012-11-18 MED ORDER — COLCHICINE 0.6 MG PO TABS
0.6000 mg | ORAL_TABLET | Freq: Two times a day (BID) | ORAL | Status: DC
  Filled 2012-11-18 (×2): qty 1

## 2012-11-18 MED ORDER — MUPIROCIN 2 % EX OINT
1.0000 "application " | TOPICAL_OINTMENT | Freq: Two times a day (BID) | CUTANEOUS | Status: DC
  Administered 2012-11-18 – 2012-11-20 (×6): 1 via NASAL
  Filled 2012-11-18: qty 22

## 2012-11-18 MED ORDER — METOPROLOL TARTRATE 12.5 MG HALF TABLET
12.5000 mg | ORAL_TABLET | Freq: Two times a day (BID) | ORAL | Status: DC
  Administered 2012-11-18 – 2012-11-20 (×5): 12.5 mg via ORAL
  Filled 2012-11-18 (×7): qty 1

## 2012-11-18 MED ORDER — LOSARTAN POTASSIUM 50 MG PO TABS
100.0000 mg | ORAL_TABLET | Freq: Every morning | ORAL | Status: DC
  Administered 2012-11-18 – 2012-11-19 (×2): 100 mg via ORAL
  Filled 2012-11-18 (×2): qty 2

## 2012-11-18 MED ORDER — GABAPENTIN 300 MG PO CAPS
300.0000 mg | ORAL_CAPSULE | Freq: Three times a day (TID) | ORAL | Status: DC
  Administered 2012-11-18 – 2012-11-20 (×7): 300 mg via ORAL
  Filled 2012-11-18 (×9): qty 1

## 2012-11-18 MED ORDER — OXYCODONE HCL 5 MG PO TABS
5.0000 mg | ORAL_TABLET | ORAL | Status: DC | PRN
  Administered 2012-11-18 – 2012-11-20 (×6): 10 mg via ORAL
  Filled 2012-11-18 (×6): qty 2

## 2012-11-18 MED ORDER — DOXYCYCLINE HYCLATE 100 MG IV SOLR
100.0000 mg | Freq: Two times a day (BID) | INTRAVENOUS | Status: DC
  Administered 2012-11-18: 100 mg via INTRAVENOUS
  Filled 2012-11-18 (×2): qty 100

## 2012-11-18 MED ORDER — HYDRALAZINE HCL 25 MG PO TABS
25.0000 mg | ORAL_TABLET | Freq: Three times a day (TID) | ORAL | Status: DC
  Administered 2012-11-18 – 2012-11-20 (×7): 25 mg via ORAL
  Filled 2012-11-18 (×9): qty 1

## 2012-11-18 MED ORDER — ACETAMINOPHEN 325 MG PO TABS: 650.0000 mg | ORAL_TABLET | Freq: Four times a day (QID) | ORAL | Status: DC | PRN

## 2012-11-18 MED ORDER — ENOXAPARIN SODIUM 40 MG/0.4ML ~~LOC~~ SOLN
40.0000 mg | SUBCUTANEOUS | Status: DC
  Administered 2012-11-19 – 2012-11-20 (×2): 40 mg via SUBCUTANEOUS
  Filled 2012-11-18 (×2): qty 0.4

## 2012-11-18 MED ORDER — ALLOPURINOL 100 MG PO TABS
100.0000 mg | ORAL_TABLET | Freq: Two times a day (BID) | ORAL | Status: DC
  Administered 2012-11-18 – 2012-11-20 (×5): 100 mg via ORAL
  Filled 2012-11-18 (×6): qty 1

## 2012-11-18 MED ORDER — INSULIN GLARGINE 100 UNIT/ML ~~LOC~~ SOLN
30.0000 [IU] | Freq: Every day | SUBCUTANEOUS | Status: DC
  Administered 2012-11-18: 30 [IU] via SUBCUTANEOUS
  Filled 2012-11-18: qty 0.3

## 2012-11-18 MED ORDER — SODIUM CHLORIDE 0.9 % IJ SOLN
3.0000 mL | Freq: Two times a day (BID) | INTRAMUSCULAR | Status: DC
  Administered 2012-11-19 – 2012-11-20 (×2): 3 mL via INTRAVENOUS

## 2012-11-18 MED ORDER — ENOXAPARIN SODIUM 40 MG/0.4ML ~~LOC~~ SOLN: 40.0000 mg | SUBCUTANEOUS | Status: DC

## 2012-11-18 MED ORDER — ISOSORBIDE MONONITRATE 10 MG PO TABS
10.0000 mg | ORAL_TABLET | Freq: Two times a day (BID) | ORAL | Status: DC
  Administered 2012-11-18 – 2012-11-20 (×5): 10 mg via ORAL
  Filled 2012-11-18 (×6): qty 1

## 2012-11-18 MED ORDER — ONDANSETRON HCL 4 MG PO TABS: 4.0000 mg | ORAL_TABLET | Freq: Four times a day (QID) | ORAL | Status: DC | PRN

## 2012-11-18 MED ORDER — ACETAMINOPHEN 650 MG RE SUPP: 650.0000 mg | Freq: Four times a day (QID) | RECTAL | Status: DC | PRN

## 2012-11-18 MED ORDER — INSULIN ASPART 100 UNIT/ML ~~LOC~~ SOLN: 0.0000 [IU] | Freq: Three times a day (TID) | SUBCUTANEOUS | Status: DC

## 2012-11-18 MED ORDER — FUROSEMIDE 10 MG/ML IJ SOLN
80.0000 mg | Freq: Two times a day (BID) | INTRAMUSCULAR | Status: DC
  Administered 2012-11-18 – 2012-11-20 (×5): 80 mg via INTRAVENOUS
  Filled 2012-11-18: qty 4
  Filled 2012-11-18 (×8): qty 8

## 2012-11-18 MED ORDER — HYDRALAZINE HCL 25 MG PO TABS: 25.0000 mg | ORAL_TABLET | Freq: Three times a day (TID) | ORAL | Status: DC

## 2012-11-18 MED ORDER — ATORVASTATIN CALCIUM 40 MG PO TABS
40.0000 mg | ORAL_TABLET | Freq: Every day | ORAL | Status: DC
  Administered 2012-11-18 – 2012-11-19 (×2): 40 mg via ORAL
  Filled 2012-11-18 (×3): qty 1

## 2012-11-18 MED ORDER — CHLORHEXIDINE GLUCONATE CLOTH 2 % EX PADS
6.0000 | MEDICATED_PAD | Freq: Every day | CUTANEOUS | Status: DC
  Administered 2012-11-18 – 2012-11-20 (×3): 6 via TOPICAL

## 2012-11-18 MED ORDER — SODIUM CHLORIDE 0.9 % IJ SOLN
3.0000 mL | Freq: Two times a day (BID) | INTRAMUSCULAR | Status: DC
  Administered 2012-11-18 – 2012-11-19 (×2): 3 mL via INTRAVENOUS

## 2012-11-18 MED ORDER — PANTOPRAZOLE SODIUM 40 MG PO TBEC
40.0000 mg | DELAYED_RELEASE_TABLET | Freq: Every day | ORAL | Status: DC
  Administered 2012-11-18 – 2012-11-20 (×3): 40 mg via ORAL
  Filled 2012-11-18 (×3): qty 1

## 2012-11-18 NOTE — Progress Notes (Signed)
Placed pt on nasal cpap for rest, autotitration mode 5-20cm h2o.  Pt is tolerating well at this time.  RN aware.  HR74, sats98%. Sterile water in humidity chamber noted at fill line.  Pt advised that RT available all night should she need additional assistance and encouraged her to call.

## 2012-11-18 NOTE — Progress Notes (Signed)
   CARE MANAGEMENT NOTE 11/18/2012  Patient:  FLORINE, SPRENKLE   Account Number:  [id number]  Date Initiated:  11/18/2012  Documentation initiated by:  Jiles Crocker  Subjective/Objective Assessment:   ADMITTED WITH CHF     Action/Plan:   PCP: Alva Garnet., MD  Specialists: Dr. Kathe Mariner. Nephrologist.  LIVES AT HOME WITH SPOUSE; IS ACTIVE WITH [facility] FOR [facility]- DISEASE MANAGEMENT PROGRAM FOR CHF   Anticipated DC Date:  11/25/2012   Anticipated DC Plan:  HOME W HOME HEALTH SERVICES      DC Planning Services  CM consult          Status of service:  In process, will continue to follow Medicare Important Message given?  NA - LOS <3 / Initial given by admissions (If response is "NO", the following Medicare IM given date fields will be blank) Per UR Regulation:  Reviewed for med. necessity/level of care/duration of stay Comments:  11/18/2012- B Demaris Bousquet RN,BSN,MHA

## 2012-11-18 NOTE — Progress Notes (Signed)
[facility] PROGRESS NOTE  Kelly [name]:[id number] DOB: August 04, 1946 DOA: 11/17/2012 PCP: Alva Garnet., MD  Assessment/Plan: Acute diastolic CHF, primarily right heart failure -Continue furosemide 80 mg IV twice a day -Fluid restrict 1200 cc daily -Echocardiogram 09/21/2011--EF 60-65%, grade 1 diastolic dysfunction, dilated IVC -Patient noncompliant with home CPAP due to electricity problems -Daily weights -TSH Acute on chronic renal failure, CKD stage III -Serum creatinine has gradually increased from 1.23 on 09/23/2011 -Appears that baseline is 1.2-1.5 -Serum creatinine 1.84 at the time of admission -Strict I.'s and O.'s -Discontinue colchicine Lower extremity edema/elevated d-dimer -Likely due to right heart failure -Await venous Doppler lower extremities -Will need V/Q scan if duplex is positive for DVT -Low suspicion of pulmonary embolus Venous stasis ulcerations -Not infected on exam -wound care consulted -Discontinue doxycycline Hypertension -Continue hydralazine, isosorbide mononitrate, metoprolol tartrate, losartan Diabetes mellitus type 2 -Discontinue Lantus altogether -Patient was hypoglycemic this morning with poor by mouth intake -Recheck hemoglobin A1c -03/22/2012 hemoglobin A1c 5.9 -Check lipids  anemia of CKD -Check iron studies   Family Communication:   Pt at beside Disposition Plan:   Home when medically stable      Procedures/Studies: Dg Chest 2 View  11/17/2012  *RADIOLOGY REPORT*  Clinical Data: Shortness of breath.  Leg swelling.  CHEST - 2 VIEW  Comparison: 03/21/2012  Findings: Heart size is normal.  There is slight pulmonary vascular prominence but there are no infiltrates or effusions.  No osseous abnormality.  IMPRESSION: Slight pulmonary vascular prominence.   Original Report Authenticated By: Francene Boyers, M.D.          Subjective:  the patient states that she has gained significant weight in the past week with  increasing abdominal girth and lower extremity edema. Denies fevers, chills, chest pain, nausea, vomiting, diarrhea, abdominal pain. Has some dyspnea on exertion. No orthopnea or PND.   Objective: Filed Vitals:   11/17/12 2300 11/17/12 2330 11/18/12 0019 11/18/12 0600  BP: 130/59 164/54 164/57 143/54  Pulse: 80 81 81 73  Temp:   97.8 F (36.6 C) 97.8 F (36.6 C)  TempSrc:   Oral Oral  Resp: 14 15 19 20   Height:   5\' 5"  (1.651 m)   Weight:   173.2 kg (381 lb 13.4 oz) 173.2 kg (381 lb 13.4 oz)  SpO2: 99% 98% 98% 100%    Intake/Output Summary (Last 24 hours) at 11/18/12 0858 Last data filed at 11/18/12 0500  Gross per 24 hour  Intake    370 ml  Output   2600 ml  Net  -2230 ml   Weight change:  Exam:   General:  Pt is alert, follows commands appropriately, not in acute distress  HEENT: No icterus, No thrush, No neck mass, [place]/AT  Cardiovascular: RRR, S1/S2, no rubs, no gallops  Respiratory: CTA bilaterally, no wheezing, no crackles, no rhonchi  Abdomen: Soft/+BS, non tender, non distended, no guarding; anasarca   Extremities: 3+ edema, No lymphangitis, No petechiae, No rashes, no synovitis; one ulceration on each of bilateral posterior thighs--good granulation without erythema, lymphangitis, pus, odor   Data Reviewed: Basic Metabolic Panel:  Recent Labs Lab 11/17/12 1725 11/18/12 0450  NA 139 142  K 4.7 4.4  CL 107 110  CO2 22 24  GLUCOSE 115* 75  BUN 53* 50*  CREATININE 1.84* 1.69*  CALCIUM 8.9 8.8   Liver Function Tests: No results found for this basename: AST, ALT, ALKPHOS, BILITOT, PROT, ALBUMIN,  in the last 168 hours No results found for  this basename: LIPASE, AMYLASE,  in the last 168 hours No results found for this basename: AMMONIA,  in the last 168 hours CBC:  Recent Labs Lab 11/17/12 1725 11/18/12 0450  WBC 5.5 4.0  HGB 9.5* 9.0*  HCT 30.4* 28.5*  MCV 86.9 86.1  PLT 234 175   Cardiac Enzymes: No results found for this basename: CKTOTAL,  CKMB, CKMBINDEX, TROPONINI,  in the last 168 hours BNP: No components found with this basename: POCBNP,  CBG:  Recent Labs Lab 11/18/12 0029 11/18/12 0755 11/18/12 0802 11/18/12 0821  GLUCAP 102* 47* 49* 61*    Recent Results (from the past 240 hour(s))  MRSA PCR SCREENING     Status: Abnormal   Collection Time    11/18/12 12:46 AM      Result Value Range Status   MRSA by PCR POSITIVE (*) NEGATIVE Final   Comment:            The GeneXpert MRSA Assay (FDA     approved for NASAL specimens     only), is one component of a     comprehensive MRSA colonization     surveillance program. It is not     intended to diagnose MRSA     infection nor to guide or     monitor treatment for     MRSA infections.     RESULT CALLED TO, READ BACK BY AND VERIFIED WITH:     HARRIS,L/4W @0228  ON 11/18/12 BY KARCZEWSKI,S.     Scheduled Meds: . allopurinol  100 mg Oral BID  . aspirin  325 mg Oral Daily  . atorvastatin  40 mg Oral q1800  . Chlorhexidine Gluconate Cloth  6 each Topical Q0600  . enoxaparin (LOVENOX) injection  170 mg Subcutaneous Q12H  . furosemide  80 mg Intravenous Q12H  . gabapentin  300 mg Oral TID  . hydrALAZINE  25 mg Oral TID  . insulin aspart  0-9 Units Subcutaneous TID WC  . isosorbide mononitrate  10 mg Oral BID  . losartan  100 mg Oral q morning - 10a  . metoprolol tartrate  12.5 mg Oral BID  . mupirocin ointment  1 application Nasal BID  . pantoprazole  40 mg Oral Daily  . sodium chloride  3 mL Intravenous Q12H  . sodium chloride  3 mL Intravenous Q12H   Continuous Infusions:    Otelia Hettinger, DO  [facility] Pager [phone]  If 7PM-7AM, please contact night-coverage [email or web address] Password [facility] 11/18/2012, 8:58 AM   LOS: 1 day

## 2012-11-18 NOTE — Progress Notes (Signed)
Hypoglycemic Event  CBG: 47  Treatment: 15 GM carbohydrate snack  Symptoms: Nervous/irritable  Follow-up CBG: [id number] CBG Result:61  Possible Reasons for Event: Inadequate meal intake  Comments/MD notified:dr. Tat notified; present    Kelly [name]  Remember to initiate Hypoglycemia Order Set & complete

## 2012-11-18 NOTE — Consult Note (Signed)
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  Patient has been seen and followed by outpatient wound care center, now discharged as wounds are chronic. HHRN sees once weekly and patioent is followed by her PCP who visits her at home. Patient has been worked up for Gastric Bypass surgery at [facility], but that is currently on hold until she loses some additional weight.  Patient spends much of her time sitting in a recliner/lift chair that is too small for her.  Occasionally she will go to her bed, but not often.  Patient dresses wounds daily independently using baby diapers and ABD pads. Wound type:chronic non-healing pressure ulcerations (3).  Two on left IT and one on right IT Pressure Ulcer POA: Yes Measurement:left:  Two wounds, side-by-side, bith measure 3cm x 1cm x .4cm.  Right:  One wound measuring 1cm x 4.5cm x .2cm. Wound ZOX:WRUEA, pink to red, non-granulating. Drainage (amount, consistency, odor) moderate amount of serous exudate (worse when sitting upright). Periwound:intact, macerated today as dressings have not been changed since last night and patient has been upright in bed. Dressing procedure/placement/frequency:  I will recommend first, daily skin care using the house cleansing and emollient products, then topical conservative wound care.   Guidelines are provided for [facility] elevation and for turning and repositioning that she is not able to accomplish at home.   I will not follow.  Please re-consult if needed. Thanks, Ladona Mow, MSN, RN, [facility], CWOCN [phone])

## 2012-11-18 NOTE — Progress Notes (Addendum)
*  PRELIMINARY RESULTS* Vascular Ultrasound Lower extremity venous duplex has been completed.  Preliminary findings: Technically difficult due to body habitus. Not all veins were visualized. No evidence of DVT noted in visualized veins bilaterally.    Farrel Demark, RDMS, RVT  11/18/2012, 9:49 AM

## 2012-11-18 NOTE — Progress Notes (Signed)
Set patient up on CPAP. Settings are on Auto Titrate because patient is unaware of her settings at home. Patient seems to be tolerating well. Will continue to monitor.

## 2012-11-19 DIAGNOSIS — I890 Lymphedema, not elsewhere classified: Secondary | ICD-10-CM

## 2012-11-19 LAB — BASIC METABOLIC PANEL
BUN: 47 mg/dL — ABNORMAL HIGH (ref 6–23)
CO2: 26 mEq/L (ref 19–32)
Chloride: 107 mEq/L (ref 96–112)
GFR calc non Af Amer: 31 mL/min — ABNORMAL LOW (ref >90)
Glucose, Bld: 86 mg/dL (ref 70–99)
Potassium: 4.4 mEq/L (ref 3.5–5.1)

## 2012-11-19 LAB — LIPID PANEL
Cholesterol: 116 mg/dL (ref 0–200)
Total CHOL/HDL Ratio: 2.5 RATIO

## 2012-11-19 LAB — CBC
HCT: 26.1 % — ABNORMAL LOW (ref 36.0–46.0)
Hemoglobin: 8.2 g/dL — ABNORMAL LOW (ref 12.0–15.0)
MCH: 27.2 pg (ref 26.0–34.0)
MCHC: 31.4 g/dL (ref 30.0–36.0)
MCV: 86.4 fL (ref 78.0–100.0)

## 2012-11-19 NOTE — Progress Notes (Signed)
Placed pt on nasal cpap for rest.  RN notified.  Autotitration mode 5-20cm h2o.  Pt is tolerating well at this time.  HR72, sats100%.  Sterile water added to fill line of humidity chamber.

## 2012-11-19 NOTE — Progress Notes (Signed)
[facility] PROGRESS NOTE  Kelly [name]:[id number] DOB: 1946/07/05 DOA: 11/17/2012 PCP: Alva Garnet., MD Brief narrative 67 year old morbidly obese female with history off diastolic CHF, right heart failure, chronic kidney disease stage III, venous stasis, hypertension, type 2 diabetes mellitus , anemia of chronic disease, gout, OHS on CPAP and pressure ulcer presented with increased bilateral lower extremity swelling.  Assessment/Plan: Acute diastolic CHF Patient started on IV Lasix 80 mg twice a day diuresing well. Negative balance of over 5 L since admission Continue with fluid restriction. Recent echo with normal EF and grade 1 diastolic dysfunction. -Daily weight -Resistant to by mouth Lasix in a.m.  Lower extremity edema with elevated d-dimer Likely in the setting of right heart failure. Doppler of lower extremity negative for DVT  Acute on chronic kidney disease stage III Baseline creatinine of 1.2-1.5 presented with a creatinine of 1.8. Monitor I/O. improved to 1.6 in a.m. labs  Venous stasis ulceration and sacral pressure ulcers Patient phone consult  Diabetes mellitus Hemoglobin A1c of 6. Patient was hypoglycemic yesterday morning and Lantus held. We'll discontinue Lantus upon discharge and recommend diet controlled.   Hypertension Continue metoprolol, , Imdur and hydralazine. Hold losartan  Anemia of chronic disease H&H stable  Code Status: Full code Family Communication: None at bedside  Disposition Plan: Home once clinically improved. Likely in 1-2 days   Consultants:  None  Procedures:  None  Antibiotics:  None  HPI/Subjective: Patient seen and examined this morning. Informs feeling better.  Objective: Filed Vitals:   11/18/12 1510 11/18/12 2159 11/19/12 0511 11/19/12 0700  BP: 126/43 146/50 114/42   Pulse: 72 80 75   Temp: 99.2 F (37.3 C) 99.7 F (37.6 C)    TempSrc: Oral Oral Oral   Resp:  18 19   Height:      Weight:     166.017 kg (366 lb)  SpO2: 98% 98% 97%     Intake/Output Summary (Last 24 hours) at 11/19/12 1535 Last data filed at 11/19/12 1500  Gross per 24 hour  Intake    640 ml  Output   2050 ml  Net  -1410 ml   Filed Weights   11/18/12 0019 11/18/12 0600 11/19/12 0700  Weight: 173.2 kg (381 lb 13.4 oz) 173.2 kg (381 lb 13.4 oz) 166.017 kg (366 lb)    Exam:   General:  Elderly obese female lying in bed in NAD  HEENT: No pallor, moist oral mucosa  Cardiovascular: Normal S1-S2, no murmurs rub or gallop  Respiratory: Diminished breath sounds bilaterally due to body habitus  Abdomen: Soft, nontender, nondistended, obese  Musculoskeletal: Warm, was pitting edema bilaterally, chronic nonhealing sacral pressure ulcers with some discharge.  CNS: AAO x3  Data Reviewed: Basic Metabolic Panel:  Recent Labs Lab 11/17/12 1725 11/18/12 0450 11/19/12 0435  NA 139 142 142  K 4.7 4.4 4.4  CL 107 110 107  CO2 22 24 26   GLUCOSE 115* 75 86  BUN 53* 50* 47*  CREATININE 1.84* 1.69* 1.66*  CALCIUM 8.9 8.8 8.6   Liver Function Tests: No results found for this basename: AST, ALT, ALKPHOS, BILITOT, PROT, ALBUMIN,  in the last 168 hours No results found for this basename: LIPASE, AMYLASE,  in the last 168 hours No results found for this basename: AMMONIA,  in the last 168 hours CBC:  Recent Labs Lab 11/17/12 1725 11/18/12 0450 11/19/12 0435  WBC 5.5 4.0 4.6  HGB 9.5* 9.0* 8.2*  HCT 30.4* 28.5* 26.1*  MCV 86.9  86.1 86.4  PLT 234 175 167   Cardiac Enzymes: No results found for this basename: CKTOTAL, CKMB, CKMBINDEX, TROPONINI,  in the last 168 hours BNP (last 3 results)  Recent Labs  01/04/12 1714 03/21/12 2116 11/17/12 1739  PROBNP 193.9* 407.5* 565.7*   CBG:  Recent Labs Lab 11/18/12 0821 11/18/12 1216 11/18/12 1710 11/19/12 0805 11/19/12 1201  GLUCAP 61* 109* 96 93 115*    Recent Results (from the past 240 hour(s))  MRSA PCR SCREENING     Status: Abnormal    Collection Time    11/18/12 12:46 AM      Result Value Range Status   MRSA by PCR POSITIVE (*) NEGATIVE Final   Comment:            The GeneXpert MRSA Assay (FDA     approved for NASAL specimens     only), is one component of a     comprehensive MRSA colonization     surveillance program. It is not     intended to diagnose MRSA     infection nor to guide or     monitor treatment for     MRSA infections.     RESULT CALLED TO, READ BACK BY AND VERIFIED WITH:     HARRIS,L/4W @0228  ON 11/18/12 BY KARCZEWSKI,S.     Studies: Dg Chest 2 View  11/17/2012  *RADIOLOGY REPORT*  Clinical Data: Shortness of breath.  Leg swelling.  CHEST - 2 VIEW  Comparison: 03/21/2012  Findings: Heart size is normal.  There is slight pulmonary vascular prominence but there are no infiltrates or effusions.  No osseous abnormality.  IMPRESSION: Slight pulmonary vascular prominence.   Original Report Authenticated By: Francene Boyers, M.D.     Scheduled Meds: . allopurinol  100 mg Oral BID  . aspirin  325 mg Oral Daily  . atorvastatin  40 mg Oral q1800  . Chlorhexidine Gluconate Cloth  6 each Topical Q0600  . enoxaparin (LOVENOX) injection  40 mg Subcutaneous Q24H  . furosemide  80 mg Intravenous Q12H  . gabapentin  300 mg Oral TID  . hydrALAZINE  25 mg Oral TID  . insulin aspart  0-9 Units Subcutaneous TID WC  . isosorbide mononitrate  10 mg Oral BID  . losartan  100 mg Oral q morning - 10a  . metoprolol tartrate  12.5 mg Oral BID  . mupirocin ointment  1 application Nasal BID  . pantoprazole  40 mg Oral Daily  . sodium chloride  3 mL Intravenous Q12H  . sodium chloride  3 mL Intravenous Q12H   Continuous Infusions:      Time spent: 25 minutes    Cecile Gillispie  [facility] Pager [phone] If 7PM-7AM, please contact night-coverage at [email or web address], password [facility] 11/19/2012, 3:35 PM  LOS: 2 days

## 2012-11-19 NOTE — Progress Notes (Signed)
Patient admitted to [facility] on 3.24.14 for lower extremity swelling.  Patient is currently active with [facility] Care Management for chronic disease management services.  Patient has been engaged by a [employer].  She has been active with [facility] for greater than a year.  Our community based plan of care has focused on disease management of DM, CHF, and BLL Lymph Edema.  Patient is considering a surgical intervention to treat her lymph edema.  She does desire to continue with [facility] for wound care postdischarge.  Patient will receive a post discharge transition of care call and will be evaluated for monthly home visits for assessments and disease process education.  Made inpatient Case Manager Jiles Crocker aware that [facility] Care Management following. Of note, [facility] Care Management services does not replace or interfere with any services that are arranged by inpatient case management or social work.  For additional questions or referrals please contact Anibal Henderson BSN RN [facility] Liaison at [phone].

## 2012-11-20 LAB — GLUCOSE, CAPILLARY: Glucose-Capillary: 94 mg/dL (ref 70–99)

## 2012-11-20 LAB — BASIC METABOLIC PANEL
CO2: 28 mEq/L (ref 19–32)
Glucose, Bld: 122 mg/dL — ABNORMAL HIGH (ref 70–99)
Potassium: 4.2 mEq/L (ref 3.5–5.1)
Sodium: 140 mEq/L (ref 135–145)

## 2012-11-20 MED ORDER — FUROSEMIDE 80 MG PO TABS: 80.0000 mg | ORAL_TABLET | Freq: Two times a day (BID) | ORAL | Status: DC

## 2012-11-20 NOTE — Discharge Summary (Signed)
Physician Discharge Summary  Kelly [name] YQI:[id number] DOB: 25-Jan-1946 DOA: 11/17/2012  PCP: Alva Garnet., MD  Admit date: 11/17/2012 Discharge date: 11/20/2012  Time spent: 40 minutes  Recommendations for Outpatient Follow-up:  1. Home with outpatient follow up with PCP and heart failure clinic  Discharge Diagnoses:  Principal Problem:   Diastolic CHF, acute on chronic  Active Problems:   Hypertension   Diabetes mellitus, type 2   Lower extremity edema   CKD (chronic kidney disease)   Anemia   Acute on chronic renal failure   Venous stasis ulcer   Morbid obesity   Discharge Condition: fair  Diet recommendation: diabetic, low sodium  Filed Weights   11/18/12 0600 11/19/12 0700 11/20/12 0455  Weight: 173.2 kg (381 lb 13.4 oz) 166.017 kg (366 lb) 166.9 kg (367 lb 15.2 oz)    History of present illness:  Please refer to admission H&P for details but in brief, 67 year old morbidly obese female with history off diastolic CHF, right heart failure, chronic kidney disease stage III, venous stasis, hypertension, type 2 diabetes mellitus , anemia of chronic disease, gout, OHS on CPAP and pressure ulcer presented with increased bilateral lower extremity swelling.   Hospital Course:  Acute diastolic CHF  Patient started on IV Lasix 80 mg twice a day diuresing well. Negative balance of over 8L since admission and has lost upto 12 lbs Continue with fluid restriction. Echo in 2013   with normal EF and grade 1 diastolic dysfunction.  -will transition to po lasix. Dose increased to 80 mg bid upon discharge. Encouraged strongly on dietary and medication adherence.  Appointment set up with heart failure clinic.   Lower extremity edema with elevated d-dimer  Likely in the setting of right heart failure. Doppler of lower extremity negative for DVT   Acute on chronic kidney disease stage III  Baseline creatinine of 1.2-1.5 presented with a creatinine of 1.8. Monitor I/O.  improved to 1.6 in a.m. labs   Venous stasis ulceration and sacral pressure ulcers  Appreciate wound consult  Diabetes mellitus  Hemoglobin A1c of 6. Patient was hypoglycemic on 3/25  morning and Lantus held. fsg has been stable between 80-120. We'll discontinue Lantus upon discharge and recommend diet control only.   Hypertension  Continue metoprolol, , Imdur and hydralazine.resume losartan   Anemia of chronic disease  H&H stable   OSA  on CPAP but non compliant  Morbid obesity  Code Status: Full code  Family Communication: None at bedside  Disposition Plan: Home with home health with outpt heart failure clinic follow up. Followed by [facility] as well   Consultants:  None Procedures:  None Antibiotics:  None  Discharge Exam: Filed Vitals:   11/19/12 1500 11/19/12 2219 11/20/12 0455 11/20/12 0458  BP: 132/48 137/43  126/57  Pulse: 67 75  67  Temp: 97.9 F (36.6 C) 98.2 F (36.8 C)  98.2 F (36.8 C)  TempSrc: Oral Oral    Resp: 18 20  20   Height:      Weight:   166.9 kg (367 lb 15.2 oz)   SpO2: 99% 99%  96%    General: Elderly obese female lying in bed in NAD  HEENT: No pallor, moist oral mucosa  Cardiovascular: Normal S1-S2, no murmurs rub or gallop  Respiratory: Diminished breath sounds bilaterally due to body habitus  Abdomen: Soft, nontender, nondistended, obese  Musculoskeletal: Warm, trace edema bilaterally, chronic nonhealing sacral pressure ulcers with some discharge.  CNS: AAO x3   Discharge Instructions  Medication List    STOP taking these medications       insulin glargine 100 UNIT/ML injection  Commonly known as:  LANTUS      TAKE these medications       allopurinol 100 MG tablet  Commonly known as:  ZYLOPRIM  Take 100 mg by mouth 2 (two) times daily.     aspirin 325 MG EC tablet  Take 325 mg by mouth daily.     atorvastatin 40 MG tablet  Commonly known as:  LIPITOR  Take 40 mg by mouth at bedtime.     colchicine 0.6 MG tablet   Take 0.6 mg by mouth 2 (two) times daily.     esomeprazole 40 MG capsule  Commonly known as:  NEXIUM  Take 40 mg by mouth daily before breakfast.     furosemide 80 MG tablet  Commonly known as:  LASIX  Take 1 tablet (80 mg total) by mouth 2 (two) times daily.     gabapentin 300 MG capsule  Commonly known as:  NEURONTIN  Take 300 mg by mouth 3 (three) times daily.     hydrALAZINE 25 MG tablet  Commonly known as:  APRESOLINE  Take 25 mg by mouth 3 (three) times daily.     isosorbide mononitrate 10 MG tablet  Commonly known as:  ISMO,MONOKET  Take 10 mg by mouth 2 (two) times daily.     losartan 100 MG tablet  Commonly known as:  COZAAR  Take 100 mg by mouth every morning.     metoprolol tartrate 25 MG tablet  Commonly known as:  LOPRESSOR  Take 12.5 mg by mouth 2 (two) times daily.     OXYCONTIN 10 mg T12a  Generic drug:  OxyCODONE  Take 10 mg by mouth every 12 (twelve) hours.     oxyCODONE 5 MG immediate release tablet  Commonly known as:  Oxy IR/ROXICODONE  Take 5-10 mg by mouth every 4 (four) hours as needed. For pain     Vitamin D (Ergocalciferol) 50000 UNITS Caps  Commonly known as:  DRISDOL  Take 50,000 Units by mouth every Thursday.     zolpidem 5 MG tablet  Commonly known as:  AMBIEN  Take 5 mg by mouth at bedtime as needed. For sleep           Follow-up Information   Follow up with Alva Garnet., MD In 1 week.   Contact information:   [address] [place] [address] [phone]        The results of significant diagnostics from this hospitalization (including imaging, microbiology, ancillary and laboratory) are listed below for reference.    Significant Diagnostic Studies: Dg Chest 2 View  11/17/2012  *RADIOLOGY REPORT*  Clinical Data: Shortness of breath.  Leg swelling.  CHEST - 2 VIEW  Comparison: 03/21/2012  Findings: Heart size is normal.  There is slight pulmonary vascular prominence but there are no infiltrates or  effusions.  No osseous abnormality.  IMPRESSION: Slight pulmonary vascular prominence.   Original Report Authenticated By: Francene Boyers, M.D.     Microbiology: Recent Results (from the past 240 hour(s))  MRSA PCR SCREENING     Status: Abnormal   Collection Time    11/18/12 12:46 AM      Result Value Range Status   MRSA by PCR POSITIVE (*) NEGATIVE Final   Comment:            The GeneXpert MRSA Assay (FDA  approved for NASAL specimens     only), is one component of a     comprehensive MRSA colonization     surveillance program. It is not     intended to diagnose MRSA     infection nor to guide or     monitor treatment for     MRSA infections.     RESULT CALLED TO, READ BACK BY AND VERIFIED WITH:     HARRIS,L/4W @0228  ON 11/18/12 BY KARCZEWSKI,S.     Labs: Basic Metabolic Panel:  Recent Labs Lab 11/17/12 1725 11/18/12 0450 11/19/12 0435 11/20/12 0410  NA 139 142 142 140  K 4.7 4.4 4.4 4.2  CL 107 110 107 106  CO2 22 24 26 28   GLUCOSE 115* 75 86 122*  BUN 53* 50* 47* 44*  CREATININE 1.84* 1.69* 1.66* 1.64*  CALCIUM 8.9 8.8 8.6 8.4   Liver Function Tests: No results found for this basename: AST, ALT, ALKPHOS, BILITOT, PROT, ALBUMIN,  in the last 168 hours No results found for this basename: LIPASE, AMYLASE,  in the last 168 hours No results found for this basename: AMMONIA,  in the last 168 hours CBC:  Recent Labs Lab 11/17/12 1725 11/18/12 0450 11/19/12 0435  WBC 5.5 4.0 4.6  HGB 9.5* 9.0* 8.2*  HCT 30.4* 28.5* 26.1*  MCV 86.9 86.1 86.4  PLT 234 175 167   Cardiac Enzymes: No results found for this basename: CKTOTAL, CKMB, CKMBINDEX, TROPONINI,  in the last 168 hours BNP: BNP (last 3 results)  Recent Labs  01/04/12 1714 03/21/12 2116 11/17/12 1739  PROBNP 193.9* 407.5* 565.7*   CBG:  Recent Labs Lab 11/19/12 0805 11/19/12 1201 11/19/12 1637 11/19/12 2217 11/20/12 0805  GLUCAP 93 115* 110* 116* 94       Signed:  Yonas Bunda,  Bayli Quesinberry  [facility] 11/20/2012, 10:47 AM

## 2012-11-24 ENCOUNTER — Encounter: Payer: Self-pay | Admitting: *Deleted

## 2012-11-24 ENCOUNTER — Encounter: Payer: Self-pay | Admitting: Cardiovascular Disease

## 2012-11-25 ENCOUNTER — Other Ambulatory Visit: Payer: Self-pay

## 2012-11-25 ENCOUNTER — Ambulatory Visit (INDEPENDENT_AMBULATORY_CARE_PROVIDER_SITE_OTHER): Payer: Medicare Other | Admitting: Cardiovascular Disease

## 2012-11-25 ENCOUNTER — Encounter: Payer: Self-pay | Admitting: Cardiovascular Disease

## 2012-11-25 VITALS — BP 134/76 | HR 116 | Ht 65.0 in | Wt 370.1 lb

## 2012-11-25 DIAGNOSIS — R609 Edema, unspecified: Secondary | ICD-10-CM

## 2012-11-25 DIAGNOSIS — I50810 Right heart failure, unspecified: Secondary | ICD-10-CM

## 2012-11-25 DIAGNOSIS — I509 Heart failure, unspecified: Secondary | ICD-10-CM

## 2012-11-25 DIAGNOSIS — I5033 Acute on chronic diastolic (congestive) heart failure: Secondary | ICD-10-CM

## 2012-11-25 DIAGNOSIS — R002 Palpitations: Secondary | ICD-10-CM

## 2012-11-25 DIAGNOSIS — R600 Localized edema: Secondary | ICD-10-CM

## 2012-11-25 MED ORDER — TORSEMIDE 20 MG PO TABS: 40.0000 mg | ORAL_TABLET | Freq: Two times a day (BID) | ORAL | Status: DC

## 2012-11-25 NOTE — Patient Instructions (Addendum)
Your physician has requested that you have an echocardiogram. Echocardiography is a painless test that uses sound waves to create images of your heart. It provides your doctor with information about the size and shape of your heart and how well your heart's chambers and valves are working. This procedure takes approximately one hour. There are no restrictions for this procedure.  Your physician has recommended you make the following change in your medication:  STOP LASIX START DEMADEX 40 MG TWICE DAILY  Your physician recommends that you schedule a follow-up appointment in: 1 MONTH WITH EKG Your physician recommends that you return for lab work in: 1 MONTH BMET   REDUCE HIGH SODIUM FOODS LIKE CANNED SOUP, GRAVY, SAUCES, READY PREPARED FOODS LIKE FROZEN FOODS; LEAN CUISINE, LASAGNA. BACON, SAUSAGE, LUNCH MEAT, FAST FOODS.Marland Kitchen  DASH Diet The DASH diet stands for "Dietary Approaches to Stop Hypertension." It is a healthy eating plan that has been shown to reduce high blood pressure (hypertension) in as little as 14 days, while also possibly providing other significant health benefits. These other health benefits include reducing the risk of breast cancer after menopause and reducing the risk of type 2 diabetes, heart disease, colon cancer, and stroke. Health benefits also include weight loss and slowing kidney failure in patients with chronic kidney disease.  DIET GUIDELINES  Limit salt (sodium). Your diet should contain less than 1500 mg of sodium daily.  Limit refined or processed carbohydrates. Your diet should include mostly whole grains. Desserts and added sugars should be used sparingly.  Include small amounts of heart-healthy fats. These types of fats include nuts, oils, and tub margarine. Limit saturated and trans fats. These fats have been shown to be harmful in the body. CHOOSING FOODS  The following food groups are based on a 2000 calorie diet. See your Registered Dietitian for individual  calorie needs. Grains and Grain Products (6 to 8 servings daily)  Eat More Often: Whole-wheat bread, brown rice, whole-grain or wheat pasta, quinoa, popcorn without added fat or salt (air popped).  Eat Less Often: White bread, white pasta, white rice, cornbread. Vegetables (4 to 5 servings daily)  Eat More Often: Fresh, frozen, and canned vegetables. Vegetables may be raw, steamed, roasted, or grilled with a minimal amount of fat.  Eat Less Often/Avoid: Creamed or fried vegetables. Vegetables in a cheese sauce. Fruit (4 to 5 servings daily)  Eat More Often: All fresh, canned (in natural juice), or frozen fruits. Dried fruits without added sugar. One hundred percent fruit juice ( cup [237 mL] daily).  Eat Less Often: Dried fruits with added sugar. Canned fruit in light or heavy syrup. [employer], Fish, and Poultry (2 servings or less daily. One serving is 3 to 4 oz [85-114 g]).  Eat More Often: Ninety percent or leaner ground beef, tenderloin, sirloin. Round cuts of beef, chicken breast, [place] breast. All fish. Grill, bake, or broil your meat. Nothing should be fried.  Eat Less Often/Avoid: Fatty cuts of meat, [place], or chicken leg, thigh, or wing. Fried cuts of meat or fish. Dairy (2 to 3 servings)  Eat More Often: Low-fat or fat-free milk, low-fat plain or light yogurt, reduced-fat or part-skim cheese.  Eat Less Often/Avoid: Milk (whole, 2%).Whole milk yogurt. Full-fat cheeses. Nuts, Seeds, and Legumes (4 to 5 servings per week)  Eat More Often: All without added salt.  Eat Less Often/Avoid: Salted nuts and seeds, canned beans with added salt. Fats and Sweets (limited)  Eat More Often: Vegetable oils, tub margarines without  trans fats, sugar-free gelatin. Mayonnaise and salad dressings.  Eat Less Often/Avoid: Coconut oils, palm oils, butter, stick margarine, cream, half and half, cookies, candy, pie. FOR MORE INFORMATION The Dash Diet Eating Plan: www.dashdiet.org Document  Released: 08/02/2011 Document Revised: 11/05/2011 Document Reviewed: 08/02/2011 [facility] Patient Information 2013 [place], [place].

## 2012-11-25 NOTE — Progress Notes (Signed)
Kelly [name] Date of Birth  24-Nov-1945       [facility]    [place] [address]  [address] [place], [place]  [address]   [place], [place]  [address] [phone]     [phone]   Fax  [phone]    Fax [phone]   Primary Cardiologist:  Sherilyn Banker, MD  Problem List: 1. Palpitations 2. Morbid obesity 3. Hypertension 4, hyperlipidemia 5. Sleep apnea- she does not wear her CPAP or night time O2. ( power issues at her appartment) 6. Diabetes Mellitus.  History of Present Illness:  Kelly [name] is a 67 year old female who presents today for a second opinion of palpitations and fatigue.  She has seen Dr. Nadara Eaton for the past year.  She's also had chronic leg lymphedema for many years. She's been hospitalized on several occasions for that.   Her legs weep almost constantly.   She has been seen at the wound care center.   She has also seen a Engineer, petroleum at [facility].  He advised her to lose weight.    She denies any chest pain. She notes that her heart beats   faster and harder when she does housework.   An echocardiogram performed in January reveals normal left ventricular systolic function. She does have some degree of diastolic dysfunction.  She does not get any exercise. She is quite debilitated. She was unable to get up on the exam table.  November 25, 2012:  She was admitted to [facility] recently for congestive heart failure and profound leg edema. She was diuresed with IV Lasix and did fairly well. She's been out now for approximately one week. She started to regain some fluid.  She's continued to take Lasix 80 mg twice a day.  She has documented obesity hypoventilation and has a CPAP mask but it is not working.  It kicks off the circuit breaker in her house.    Current Outpatient Prescriptions on File Prior to Visit  Medication Sig Dispense Refill  . allopurinol (ZYLOPRIM) 100 MG tablet Take 100 mg by  mouth 2 (two) times daily.      Marland Kitchen aspirin 325 MG EC tablet Take 325 mg by mouth daily.      Marland Kitchen atorvastatin (LIPITOR) 40 MG tablet Take 40 mg by mouth at bedtime.      . colchicine 0.6 MG tablet Take 0.6 mg by mouth 2 (two) times daily.      Marland Kitchen esomeprazole (NEXIUM) 40 MG capsule Take 40 mg by mouth daily before breakfast.      . furosemide (LASIX) 80 MG tablet Take 1 tablet (80 mg total) by mouth 2 (two) times daily.  60 tablet  0  . gabapentin (NEURONTIN) 300 MG capsule Take 300 mg by mouth 3 (three) times daily.      . hydrALAZINE (APRESOLINE) 25 MG tablet Take 25 mg by mouth 3 (three) times daily.      . isosorbide mononitrate (ISMO,MONOKET) 10 MG tablet Take 10 mg by mouth 2 (two) times daily.      Marland Kitchen losartan (COZAAR) 100 MG tablet Take 100 mg by mouth every morning.       . metoprolol tartrate (LOPRESSOR) 25 MG tablet Take 12.5 mg by mouth 2 (two) times daily.      Marland Kitchen oxyCODONE (OXY IR/ROXICODONE) 5 MG immediate release tablet Take 5-10 mg by mouth every 4 (four) hours as needed. For pain      .  OxyCODONE (OXYCONTIN) 10 mg T12A Take 10 mg by mouth every 12 (twelve) hours.      . Vitamin D, Ergocalciferol, (DRISDOL) 50000 UNITS CAPS Take 50,000 Units by mouth every Thursday.      . zolpidem (AMBIEN) 5 MG tablet Take 5 mg by mouth at bedtime as needed. For sleep       No current facility-administered medications on file prior to visit.    Allergies  Allergen Reactions  . Penicillins Hives    Past Medical History  Diagnosis Date  . Hypertension   . Diabetes mellitus   . Hyperlipidemia   . CHF (congestive heart failure)   . GERD (gastroesophageal reflux disease)   . Depression   . Sleep apnea     doesn't use cpap machine or 02 at night  . Headache     Migraines  . Arthritis     knees  . Anemia     Had blood transfusion x 2 in yhe 90s  . Lymphedema of leg     Past Surgical History  Procedure Laterality Date  . Rotator cuff repair      right  . Knee arthroscopy      right   . Temple biopsy    . Colonoscopy with propofol  09/23/2012    Procedure: COLONOSCOPY WITH PROPOFOL;  Surgeon: Beverley Fiedler, MD;  Location: [facility];  Service: Gastroenterology;  Laterality: N/A;    History  Smoking status  . Former Smoker  . Types: Cigarettes  . Quit date: 08/27/1981  Smokeless tobacco  . Never Used    History  Alcohol Use No    Family History  Problem Relation Age of Onset  . Emphysema Father   . Asthma Brother   . Heart disease Mother   . Stomach cancer Brother   . Heart disease Maternal Grandmother   . Diabetes Mother   . Diabetes Sister     Reviw of Systems:  Reviewed in the HPI.  All other systems are negative.  Physical Exam: Blood pressure 134/76, pulse 116, height 5\' 5"  (1.651 m), weight 370 lb 1.9 oz (167.885 kg), SpO2 94.00%. General: Well developed, well nourished, in no acute distress.  Head: Normocephalic, atraumatic, sclera non-icteric, mucus membranes are moist,   Neck: her neck is very thick, unable to assess JVP.  Lungs: Clear   Heart: distant heart sounds, RR  Abdomen: Soft, non-tender, morbid obesity,  + BS  Msk:  Walks with a walker.  Very weak.  Could not get up on the exam table.  Extremities: massive lymphedema with evidence of weeping and chronic stasis changed within each "mass" of lymphedema  Neuro: CN II - XII intact.  Alert and oriented X 3.   Psych:  Normal   ECG:  Assessment / Plan:

## 2012-11-25 NOTE — Telephone Encounter (Signed)
Pt called to reqst refill for demadex be sent into [facility] [address] [facility] prior pharmacy is closing.

## 2012-11-25 NOTE — Assessment & Plan Note (Signed)
She had an event monitor. She was sinus rhythm. We did not see any significant arrhythmias.

## 2012-11-25 NOTE — Assessment & Plan Note (Addendum)
Kelly [name] presents today for further assessment of her massive leg edema. She has profound lymphedema. She was recently hospitalized and was diuresed with IV Lasix. They discharged her with by mouth Lasix but she has not diuresed much at all. She was originally advised to go to the heart failure clinic. She returned to our office instead.  She was diagnosed as having right heart failure in the hospital. They did not repeat her echocardiogram and her echo one year ago did not show any significant right heart failure. It is possible that she has right heart failure since she's not wearing her CPAP at night. She also has morbid obesity and certainly has obesity hypoventilation.  We will repeat her echocardiogram at this time. We'll discontinue the Lasix and start her on Demadex 40 mg twice a day. We'll see her in one month for followup visit and basic metabolic profile. We may need to send her to the Advanced Heart Failure clinic.  I have advised her to fix her CPAP machine.  We will see her again in 1 months.

## 2012-11-25 NOTE — Assessment & Plan Note (Signed)
She has significant lymphedema in her legs. I suspect a lot of this is due to her morbid obesity and a piece of the hyperventilation. She also was told that she had right heart failure although the most recent echocardiogram does not mention any evidence of right heart failure. It is likely given her other clinical problems.  We'll repeat her echocardiogram for further evaluation.  We will discontinue the Lasix and start her on Demadex 40 mg twice day. This should help with her edema.  We may need to refer her to the advanced heart failure clinic were not able to make progress.

## 2012-11-26 ENCOUNTER — Telehealth: Payer: Self-pay | Admitting: *Deleted

## 2012-11-26 ENCOUNTER — Telehealth: Payer: Self-pay | Admitting: Cardiovascular Disease

## 2012-11-26 NOTE — Telephone Encounter (Signed)
MSG LEFT TO ELEVATE LEGS, 3-5 X DAY, CALL BACK WITH ADDITIONAL QUESTIONS OR CONCERNS, NUMBER PROVIDED.

## 2012-11-26 NOTE — Telephone Encounter (Signed)
PT STATES SHE IS FEELING BETTER, MORE FLUID OFF HER LEGS, SHE WILL ELEVATE LEGS 3-4 X DAY FOR 10-15 MINUTES.

## 2012-11-26 NOTE — Telephone Encounter (Signed)
New Prob   Pt calling back returning phone call from earlier. Please call.

## 2012-11-28 ENCOUNTER — Ambulatory Visit (HOSPITAL_COMMUNITY): Payer: Medicare Other | Attending: Cardiology | Admitting: Radiology

## 2012-11-28 DIAGNOSIS — I50810 Right heart failure, unspecified: Secondary | ICD-10-CM

## 2012-11-28 DIAGNOSIS — I509 Heart failure, unspecified: Secondary | ICD-10-CM | POA: Insufficient documentation

## 2012-11-28 NOTE — Progress Notes (Signed)
Echocardiogram performed.  

## 2012-12-10 ENCOUNTER — Telehealth: Payer: Self-pay | Admitting: *Deleted

## 2012-12-10 NOTE — Telephone Encounter (Signed)
Received fax in office from [facility] that pt has had a 10 lb weight gain in a week. Faxed records indicate lowest recent wt was on 12/01/12 --357.39 lbs.  Weight on 4/15 was 368 lbs. I spoke with pt who reports wt today is 367.8. She states she is not having shortness of breath. She notes increased swelling in thighs, no increase in swelling in lower legs. She states she has not been eating her own cooking lately and that people have been bringing her food with high salt content.  Chicken on Sunday was very salty.  She is taking demadex as ordered.  She is aware of need to avoid salt and to elevate legs when possible.  Will forward note to Dr. Elease Hashimoto and have fax available for review when back in office.

## 2012-12-10 NOTE — Telephone Encounter (Signed)
Spoke with Amaryllis Dyke, RN at [facility] and gave her information from Dr. Elease Hashimoto. She will send future weight and vital sign reports to pt's primary MD--Dr. Renae Gloss.

## 2012-12-10 NOTE — Telephone Encounter (Signed)
Patient has normal LV and normal RV function.  Her leg edema is likely due to venous insufficiency and her morbid obesity - no hear failure.  Please refer the home care agency back to the doctor who ordered it - we did not order it.  She will need to see her medical doctor .  We have advised her to elevate her legs and to avoid salt.   Thanks

## 2012-12-24 ENCOUNTER — Encounter (HOSPITAL_COMMUNITY): Payer: Medicare Other

## 2013-01-05 ENCOUNTER — Ambulatory Visit (INDEPENDENT_AMBULATORY_CARE_PROVIDER_SITE_OTHER): Payer: Medicare Other | Admitting: Cardiovascular Disease

## 2013-01-05 ENCOUNTER — Encounter: Payer: Self-pay | Admitting: Cardiovascular Disease

## 2013-01-05 ENCOUNTER — Other Ambulatory Visit: Payer: Medicare Other

## 2013-01-05 VITALS — BP 108/60 | HR 83 | Ht 65.0 in | Wt 280.1 lb

## 2013-01-05 DIAGNOSIS — I5033 Acute on chronic diastolic (congestive) heart failure: Secondary | ICD-10-CM

## 2013-01-05 DIAGNOSIS — I509 Heart failure, unspecified: Secondary | ICD-10-CM

## 2013-01-05 NOTE — Progress Notes (Signed)
Kelly [name] Date of Birth  Jun 01, 1946       [facility]    [place] [address]  [address] [place], [place]  [address]   [place], [place]  [address] [phone]     [phone]   Fax  [phone]    Fax [phone]   Primary Cardiologist:  Sherilyn Banker, MD  Problem List: 1. Palpitations 2. Morbid obesity 3. Hypertension 4, hyperlipidemia 5. Sleep apnea- she does not wear her CPAP or night time O2. ( power issues at her appartment) 6. Diabetes Mellitus.  History of Present Illness:  Kelly [name] is a 67 year old female who presents today for a second opinion of palpitations and fatigue.  She has seen Dr. Nadara Eaton for the past year.  She's also had chronic leg lymphedema for many years. She's been hospitalized on several occasions for that.   Her legs weep almost constantly.   She has been seen at the wound care center.   She has also seen a Engineer, petroleum at [facility].  He advised her to lose weight.    She denies any chest pain. She notes that her heart beats   faster and harder when she does housework.   An echocardiogram performed in January reveals normal left ventricular systolic function. She does have some degree of diastolic dysfunction.  She does not get any exercise. She is quite debilitated. She was unable to get up on the exam table.  November 25, 2012:  She was admitted to [facility] recently for congestive heart failure and profound leg edema. She was diuresed with IV Lasix and did fairly well. She's been out now for approximately one week. She started to regain some fluid.  She's continued to take Lasix 80 mg twice a day.  She has documented obesity hypoventilation and has a CPAP mask but it is not working.  It kicks off the circuit breaker in her house.   Jan 05, 2013:  Kelly [name] is a 67 yo with the above noted history.  She has been trying to reduce her sodium.   Dr. Allena Katz reduced her demedex ( I  presume renal insufficiency, labs are not available).  Since that time she has gained 20 lbs. She is also having some hypoxemia.     Current Outpatient Prescriptions on File Prior to Visit  Medication Sig Dispense Refill  . allopurinol (ZYLOPRIM) 100 MG tablet Take 100 mg by mouth 2 (two) times daily.      Marland Kitchen aspirin 325 MG EC tablet Take 325 mg by mouth daily.      Marland Kitchen atorvastatin (LIPITOR) 40 MG tablet Take 40 mg by mouth at bedtime.      . colchicine 0.6 MG tablet Take 0.6 mg by mouth 2 (two) times daily.      Marland Kitchen esomeprazole (NEXIUM) 40 MG capsule Take 40 mg by mouth daily before breakfast.      . gabapentin (NEURONTIN) 300 MG capsule Take 300 mg by mouth 3 (three) times daily.      . hydrALAZINE (APRESOLINE) 25 MG tablet Take 25 mg by mouth 3 (three) times daily.      . isosorbide mononitrate (ISMO,MONOKET) 10 MG tablet Take 10 mg by mouth 2 (two) times daily.      Marland Kitchen losartan (COZAAR) 100 MG tablet Take 100 mg by mouth every morning.       . metoprolol tartrate (LOPRESSOR) 25 MG tablet Take 12.5 mg by mouth  2 (two) times daily.      Marland Kitchen oxyCODONE (OXY IR/ROXICODONE) 5 MG immediate release tablet Take 20 mg by mouth 2 (two) times daily. For pain      . OxyCODONE (OXYCONTIN) 10 mg T12A Take 10 mg by mouth every 12 (twelve) hours.      . torsemide (DEMADEX) 20 MG tablet Take 2 tablets (40 mg total) by mouth 2 (two) times daily.  120 tablet  5  . Vitamin D, Ergocalciferol, (DRISDOL) 50000 UNITS CAPS Take 50,000 Units by mouth every Thursday.      . zolpidem (AMBIEN) 5 MG tablet Take 5 mg by mouth at bedtime as needed. For sleep       No current facility-administered medications on file prior to visit.    Allergies  Allergen Reactions  . Penicillins Hives    Past Medical History  Diagnosis Date  . Hypertension   . Diabetes mellitus   . Hyperlipidemia   . CHF (congestive heart failure)   . GERD (gastroesophageal reflux disease)   . Depression   . Sleep apnea     doesn't use cpap  machine or 02 at night  . Headache     Migraines  . Arthritis     knees  . Anemia     Had blood transfusion x 2 in yhe 90s  . Lymphedema of leg     Past Surgical History  Procedure Laterality Date  . Rotator cuff repair      right  . Knee arthroscopy      right  . Temple biopsy    . Colonoscopy with propofol  09/23/2012    Procedure: COLONOSCOPY WITH PROPOFOL;  Surgeon: Beverley Fiedler, MD;  Location: [facility];  Service: Gastroenterology;  Laterality: N/A;    History  Smoking status  . Former Smoker  . Types: Cigarettes  . Quit date: 08/27/1981  Smokeless tobacco  . Never Used    History  Alcohol Use No    Family History  Problem Relation Age of Onset  . Emphysema Father   . Asthma Brother   . Heart disease Mother   . Stomach cancer Brother   . Heart disease Maternal Grandmother   . Diabetes Mother   . Diabetes Sister     Reviw of Systems:  Reviewed in the HPI.  All other systems are negative.  Physical Exam: Blood pressure 108/60, pulse 83, height 5\' 5"  (1.651 m), weight 280 lb 1.9 oz (127.062 kg), SpO2 88.00%. General: Well developed, well nourished, in no acute distress.  Head: Normocephalic, atraumatic, sclera non-icteric, mucus membranes are moist,   Neck: her neck is very thick, unable to assess JVP.  Lungs: Clear   Heart: distant heart sounds, RR  Abdomen: Soft, non-tender, morbid obesity,  + BS  Msk:  Walks with a walker.  Very weak.  Could not get up on the exam table.  Extremities: massive lymphedema with evidence of weeping and chronic stasis changed within each "mass" of lymphedema  Neuro: CN II - XII intact.  Alert and oriented X 3.   Psych:  Normal   ECG:  Assessment / Plan:

## 2013-01-05 NOTE — Patient Instructions (Addendum)
Your physician wants you to follow-up in: AS NEEDED BASIS  Your physician has recommended you make the following change in your medication: RESTART TAKING DEMADEX 40 MG TWICE DAILY.  FOLLOW UP WITH JULIE BARR FOR APP SOON FOR LABS

## 2013-01-05 NOTE — Assessment & Plan Note (Signed)
Kelly [name] has normal right and left heart function.  She is morbidly obese which I think is her main problem.  She also had some renal insuffiencey.  She has obesity hypoventilation but does not wear her CPAP because it keeps tripping the circuit breaker.   She was doing well on the Demedex 40 bid but this dose was dropped to Demedex 10 mg a day - I presume because of renal insuffiencey.   I do not have those labs available  Her O2 sat is marginal -   I have paged Dr. Allena Katz but received no response.  I have discussed the case with Marletta Lor, NP who is scheduled to see her tomorrow.  She will follow up with ms. Russel's diuretic requirements and renal function.  I have told her that her heart is functioning as well as it could under the current circumstances.  I will see her on an as needed basis.

## 2013-02-05 ENCOUNTER — Telehealth: Payer: Self-pay | Admitting: Internal Medicine

## 2013-02-05 NOTE — Telephone Encounter (Signed)
Pt seen by Dr Rhea Belton 08/12/12 and then had a COLON on 09/23/12 with an adenomatous polyp. Today she reports soreness in her abdomen and diarrhea as well as a decreased appetite; she just feels bad. She reports this started about 2 weeks ago; she has indigestion, but is taking Nexium. She reports waking up and she has been incontinent of stool without knowing it. When asked if she has started any new meds recently she reports magnesium by Dr Allena Katz. According to [employer], she is taking Mag Ox 400 mg 2 tabs bid. explained to pt that mag ox can cause diarrhea and it is sometimes used for constipation. Asked pt to please report this to Dr Allena Katz and if he feels she needs [place] to see her she will; pt stated understanding.

## 2013-05-01 ENCOUNTER — Emergency Department (HOSPITAL_COMMUNITY): Payer: Medicare Other

## 2013-05-01 ENCOUNTER — Encounter (HOSPITAL_COMMUNITY): Payer: Self-pay | Admitting: Emergency Medicine

## 2013-05-01 ENCOUNTER — Inpatient Hospital Stay (HOSPITAL_COMMUNITY)
Admission: EM | Admit: 2013-05-01 | Discharge: 2013-05-05 | DRG: 391 | Disposition: A | Discharge status: Skilled Nursing Facility | Payer: Medicare Other | Attending: Internal Medicine | Admitting: Internal Medicine

## 2013-05-01 DIAGNOSIS — I129 Hypertensive chronic kidney disease with stage 1 through stage 4 chronic kidney disease, or unspecified chronic kidney disease: Secondary | ICD-10-CM | POA: Diagnosis present

## 2013-05-01 DIAGNOSIS — D631 Anemia in chronic kidney disease: Secondary | ICD-10-CM | POA: Diagnosis present

## 2013-05-01 DIAGNOSIS — I5032 Chronic diastolic (congestive) heart failure: Secondary | ICD-10-CM

## 2013-05-01 DIAGNOSIS — Z87891 Personal history of nicotine dependence: Secondary | ICD-10-CM

## 2013-05-01 DIAGNOSIS — Z7982 Long term (current) use of aspirin: Secondary | ICD-10-CM

## 2013-05-01 DIAGNOSIS — F329 Major depressive disorder, single episode, unspecified: Secondary | ICD-10-CM | POA: Diagnosis present

## 2013-05-01 DIAGNOSIS — L8993 Pressure ulcer of unspecified site, stage 3: Secondary | ICD-10-CM | POA: Diagnosis present

## 2013-05-01 DIAGNOSIS — I509 Heart failure, unspecified: Secondary | ICD-10-CM | POA: Diagnosis present

## 2013-05-01 DIAGNOSIS — E785 Hyperlipidemia, unspecified: Secondary | ICD-10-CM | POA: Diagnosis present

## 2013-05-01 DIAGNOSIS — A088 Other specified intestinal infections: Principal | ICD-10-CM | POA: Diagnosis present

## 2013-05-01 DIAGNOSIS — N189 Chronic kidney disease, unspecified: Secondary | ICD-10-CM | POA: Diagnosis present

## 2013-05-01 DIAGNOSIS — E1149 Type 2 diabetes mellitus with other diabetic neurological complication: Secondary | ICD-10-CM | POA: Diagnosis present

## 2013-05-01 DIAGNOSIS — I10 Essential (primary) hypertension: Secondary | ICD-10-CM

## 2013-05-01 DIAGNOSIS — K219 Gastro-esophageal reflux disease without esophagitis: Secondary | ICD-10-CM | POA: Diagnosis present

## 2013-05-01 DIAGNOSIS — Z79899 Other long term (current) drug therapy: Secondary | ICD-10-CM

## 2013-05-01 DIAGNOSIS — G4730 Sleep apnea, unspecified: Secondary | ICD-10-CM

## 2013-05-01 DIAGNOSIS — F3289 Other specified depressive episodes: Secondary | ICD-10-CM | POA: Diagnosis present

## 2013-05-01 DIAGNOSIS — R112 Nausea with vomiting, unspecified: Secondary | ICD-10-CM

## 2013-05-01 DIAGNOSIS — R197 Diarrhea, unspecified: Secondary | ICD-10-CM

## 2013-05-01 DIAGNOSIS — I890 Lymphedema, not elsewhere classified: Secondary | ICD-10-CM | POA: Diagnosis present

## 2013-05-01 DIAGNOSIS — E1142 Type 2 diabetes mellitus with diabetic polyneuropathy: Secondary | ICD-10-CM | POA: Diagnosis present

## 2013-05-01 DIAGNOSIS — M129 Arthropathy, unspecified: Secondary | ICD-10-CM | POA: Diagnosis present

## 2013-05-01 DIAGNOSIS — L8992 Pressure ulcer of unspecified site, stage 2: Secondary | ICD-10-CM | POA: Diagnosis present

## 2013-05-01 DIAGNOSIS — Z6841 Body Mass Index (BMI) 40.0 and over, adult: Secondary | ICD-10-CM

## 2013-05-01 DIAGNOSIS — N179 Acute kidney failure, unspecified: Secondary | ICD-10-CM | POA: Diagnosis present

## 2013-05-01 DIAGNOSIS — E662 Morbid (severe) obesity with alveolar hypoventilation: Secondary | ICD-10-CM

## 2013-05-01 DIAGNOSIS — E119 Type 2 diabetes mellitus without complications: Secondary | ICD-10-CM

## 2013-05-01 DIAGNOSIS — E860 Dehydration: Secondary | ICD-10-CM | POA: Diagnosis present

## 2013-05-01 DIAGNOSIS — L89899 Pressure ulcer of other site, unspecified stage: Secondary | ICD-10-CM | POA: Diagnosis present

## 2013-05-01 LAB — COMPREHENSIVE METABOLIC PANEL
ALT: 60 U/L — ABNORMAL HIGH (ref 0–35)
AST: 57 U/L — ABNORMAL HIGH (ref 0–37)
Alkaline Phosphatase: 158 U/L — ABNORMAL HIGH (ref 39–117)
GFR calc Af Amer: 18 mL/min — ABNORMAL LOW (ref >90)
Glucose, Bld: 111 mg/dL — ABNORMAL HIGH (ref 70–99)
Potassium: 4.1 mEq/L (ref 3.5–5.1)
Sodium: 138 mEq/L (ref 135–145)
Total Protein: 7.1 g/dL (ref 6.0–8.3)

## 2013-05-01 LAB — CBC WITH DIFFERENTIAL/PLATELET
Basophils Absolute: 0 10*3/uL (ref 0.0–0.1)
Eosinophils Absolute: 0.2 10*3/uL (ref 0.0–0.7)
Lymphocytes Relative: 21 % (ref 12–46)
Lymphs Abs: 1.1 10*3/uL (ref 0.7–4.0)
Neutrophils Relative %: 68 % (ref 43–77)
Platelets: 181 10*3/uL (ref 150–400)

## 2013-05-01 LAB — URINALYSIS, ROUTINE W REFLEX MICROSCOPIC
Bilirubin Urine: NEGATIVE
Glucose, UA: NEGATIVE mg/dL
Hgb urine dipstick: NEGATIVE
Specific Gravity, Urine: 1.012 (ref 1.005–1.030)
Urobilinogen, UA: 0.2 mg/dL (ref 0.0–1.0)
pH: 6.5 (ref 5.0–8.0)

## 2013-05-01 MED ORDER — MORPHINE SULFATE 2 MG/ML IJ SOLN
1.0000 mg | INTRAMUSCULAR | Status: DC | PRN
  Administered 2013-05-02 – 2013-05-05 (×9): 1 mg via INTRAVENOUS
  Filled 2013-05-01 (×9): qty 1

## 2013-05-01 MED ORDER — INSULIN ASPART 100 UNIT/ML ~~LOC~~ SOLN
0.0000 [IU] | Freq: Three times a day (TID) | SUBCUTANEOUS | Status: DC
  Administered 2013-05-02: 2 [IU] via SUBCUTANEOUS
  Administered 2013-05-02: 3 [IU] via SUBCUTANEOUS
  Administered 2013-05-03 – 2013-05-04 (×3): 2 [IU] via SUBCUTANEOUS

## 2013-05-01 MED ORDER — ALPRAZOLAM 0.25 MG PO TABS: 0.2500 mg | ORAL_TABLET | Freq: Two times a day (BID) | ORAL | Status: DC | PRN

## 2013-05-01 MED ORDER — SODIUM CHLORIDE 0.9 % IV BOLUS (SEPSIS)
1000.0000 mL | Freq: Once | INTRAVENOUS | Status: AC
  Administered 2013-05-01: 1000 mL via INTRAVENOUS

## 2013-05-01 MED ORDER — MORPHINE SULFATE 4 MG/ML IJ SOLN
4.0000 mg | Freq: Once | INTRAMUSCULAR | Status: AC
  Administered 2013-05-01: 4 mg via INTRAVENOUS
  Filled 2013-05-01: qty 1

## 2013-05-01 MED ORDER — ONDANSETRON HCL 4 MG/2ML IJ SOLN
4.0000 mg | Freq: Once | INTRAMUSCULAR | Status: AC
Start: 2013-05-01 — End: 2013-05-01
  Administered 2013-05-01: 4 mg via INTRAVENOUS
  Filled 2013-05-01: qty 2

## 2013-05-01 MED ORDER — OXYCODONE HCL ER 20 MG PO T12A: 20.0000 mg | EXTENDED_RELEASE_TABLET | Freq: Four times a day (QID) | ORAL | Status: DC | PRN

## 2013-05-01 MED ORDER — ASPIRIN EC 325 MG PO TBEC
325.0000 mg | DELAYED_RELEASE_TABLET | Freq: Every day | ORAL | Status: DC
  Administered 2013-05-02 – 2013-05-05 (×4): 325 mg via ORAL
  Filled 2013-05-01 (×4): qty 1

## 2013-05-01 MED ORDER — OXYCODONE HCL 5 MG PO TABS
20.0000 mg | ORAL_TABLET | Freq: Four times a day (QID) | ORAL | Status: DC | PRN
  Administered 2013-05-05: 20 mg via ORAL
  Filled 2013-05-01: qty 4

## 2013-05-01 MED ORDER — ZOLPIDEM TARTRATE 5 MG PO TABS
5.0000 mg | ORAL_TABLET | Freq: Every evening | ORAL | Status: DC | PRN
  Administered 2013-05-04: 5 mg via ORAL
  Filled 2013-05-01: qty 1

## 2013-05-01 MED ORDER — PANTOPRAZOLE SODIUM 40 MG PO TBEC
80.0000 mg | DELAYED_RELEASE_TABLET | Freq: Every day | ORAL | Status: DC
  Administered 2013-05-02 – 2013-05-05 (×4): 80 mg via ORAL
  Filled 2013-05-01 (×4): qty 2

## 2013-05-01 MED ORDER — ONDANSETRON HCL 4 MG PO TABS: 4.0000 mg | ORAL_TABLET | Freq: Four times a day (QID) | ORAL | Status: DC | PRN

## 2013-05-01 MED ORDER — MAGNESIUM OXIDE 400 (241.3 MG) MG PO TABS
400.0000 mg | ORAL_TABLET | Freq: Two times a day (BID) | ORAL | Status: DC
  Administered 2013-05-01 – 2013-05-05 (×8): 400 mg via ORAL
  Filled 2013-05-01 (×9): qty 1

## 2013-05-01 MED ORDER — METOCLOPRAMIDE HCL 5 MG/ML IJ SOLN: 5.0000 mg | Freq: Three times a day (TID) | INTRAMUSCULAR | Status: DC | PRN

## 2013-05-01 MED ORDER — SODIUM CHLORIDE 0.9 % IV SOLN
INTRAVENOUS | Status: DC
  Administered 2013-05-01 – 2013-05-04 (×5): via INTRAVENOUS

## 2013-05-01 MED ORDER — METOPROLOL TARTRATE 12.5 MG HALF TABLET
12.5000 mg | ORAL_TABLET | Freq: Two times a day (BID) | ORAL | Status: DC
  Administered 2013-05-01 – 2013-05-05 (×7): 12.5 mg via ORAL
  Filled 2013-05-01 (×9): qty 1

## 2013-05-01 MED ORDER — HYDRALAZINE HCL 25 MG PO TABS
25.0000 mg | ORAL_TABLET | Freq: Three times a day (TID) | ORAL | Status: DC
  Administered 2013-05-01 – 2013-05-05 (×10): 25 mg via ORAL
  Filled 2013-05-01 (×13): qty 1

## 2013-05-01 MED ORDER — GABAPENTIN 300 MG PO CAPS
300.0000 mg | ORAL_CAPSULE | Freq: Three times a day (TID) | ORAL | Status: DC
  Administered 2013-05-01 – 2013-05-05 (×12): 300 mg via ORAL
  Filled 2013-05-01 (×13): qty 1

## 2013-05-01 MED ORDER — HEPARIN SODIUM (PORCINE) 5000 UNIT/ML IJ SOLN
5000.0000 [IU] | Freq: Three times a day (TID) | INTRAMUSCULAR | Status: DC
  Administered 2013-05-02 – 2013-05-05 (×11): 5000 [IU] via SUBCUTANEOUS
  Filled 2013-05-01 (×14): qty 1

## 2013-05-01 MED ORDER — ONDANSETRON HCL 4 MG/2ML IJ SOLN
4.0000 mg | Freq: Four times a day (QID) | INTRAMUSCULAR | Status: DC | PRN
  Administered 2013-05-02 – 2013-05-04 (×3): 4 mg via INTRAVENOUS
  Filled 2013-05-01 (×3): qty 2

## 2013-05-01 MED ORDER — ONDANSETRON HCL 4 MG/2ML IJ SOLN
4.0000 mg | Freq: Once | INTRAMUSCULAR | Status: AC
  Administered 2013-05-01: 4 mg via INTRAVENOUS
  Filled 2013-05-01: qty 2

## 2013-05-01 MED ORDER — ISOSORBIDE MONONITRATE 10 MG PO TABS
10.0000 mg | ORAL_TABLET | Freq: Two times a day (BID) | ORAL | Status: DC
Start: 2013-05-01 — End: 2013-05-05
  Administered 2013-05-01 – 2013-05-05 (×8): 10 mg via ORAL
  Filled 2013-05-01 (×9): qty 1

## 2013-05-01 NOTE — ED Provider Notes (Signed)
CSN: [id number]     Arrival date & time 05/01/13  1655 History   First MD Initiated Contact with Patient 05/01/13 1703     Chief Complaint  Patient presents with  . Nausea  . Emesis  . Diarrhea   (Consider location/radiation/quality/duration/timing/severity/associated sxs/prior Treatment) Patient is a 67 y.o. female presenting with vomiting and diarrhea. The history is provided by the patient and medical records.  Emesis Associated symptoms: abdominal pain and diarrhea   Diarrhea Associated symptoms: abdominal pain and vomiting    Pt with past medical history significant for HTN, DM, HLP, CHF, GERD, depression, sleep apnea, arthritis, lymphedema of LE, anemia, presents to the ED via EMS for persistent nausea, vomiting, and diarrhea x2 weeks. Pt was evaluated in her home by PCP Marletta Lor and encouraged to come to the ED.  Denies any blood in stool or emesis. No recent antibiotic use or travel out of the country.  No fevers, sweats, or chills.  Notes some RUQ and epigastric abdominal pain for the past few days.  No alleviating or exacerbating factors.  No sick contacts.  No fevers, sweats, or chills.  No urinary sx.  No prior abdominal surgeries.  Colonoscopy UTD.  Pt states BMs always abnormal.  Medical records reviewed-- colonoscopy January 2014. 1 solitary 3mm polyp removed from hepatic flexure-- found to be tubular adenoma.  Past Medical History  Diagnosis Date  . Hypertension   . Diabetes mellitus   . Hyperlipidemia   . CHF (congestive heart failure)   . GERD (gastroesophageal reflux disease)   . Depression   . Sleep apnea     doesn't use cpap machine or 02 at night  . Headache(784.0)     Migraines  . Arthritis     knees  . Anemia     Had blood transfusion x 2 in yhe 90s  . Lymphedema of leg    Past Surgical History  Procedure Laterality Date  . Rotator cuff repair      right  . Knee arthroscopy      right  . Temple biopsy    . Colonoscopy with propofol  09/23/2012     Procedure: COLONOSCOPY WITH PROPOFOL;  Surgeon: Beverley Fiedler, MD;  Location: [facility];  Service: Gastroenterology;  Laterality: N/A;   Family History  Problem Relation Age of Onset  . Emphysema Father   . Asthma Brother   . Heart disease Mother   . Stomach cancer Brother   . Heart disease Maternal Grandmother   . Diabetes Mother   . Diabetes Sister    History  Substance Use Topics  . Smoking status: Former Smoker    Types: Cigarettes    Quit date: 08/27/1981  . Smokeless tobacco: Never Used  . Alcohol Use: No   OB History   Grav Para Term Preterm Abortions TAB SAB Ect Mult Living                 Review of Systems  Gastrointestinal: Positive for nausea, vomiting, abdominal pain and diarrhea.  All other systems reviewed and are negative.    Allergies  Penicillins  Home Medications   Current Outpatient Rx  Name  Route  Sig  Dispense  Refill  . allopurinol (ZYLOPRIM) 100 MG tablet   Oral   Take 100 mg by mouth 2 (two) times daily.         Marland Kitchen aspirin 325 MG EC tablet   Oral   Take 325 mg by mouth daily.         Marland Kitchen  atorvastatin (LIPITOR) 40 MG tablet   Oral   Take 40 mg by mouth at bedtime.         . colchicine 0.6 MG tablet   Oral   Take 0.6 mg by mouth 2 (two) times daily.         Marland Kitchen esomeprazole (NEXIUM) 40 MG capsule   Oral   Take 40 mg by mouth daily before breakfast.         . gabapentin (NEURONTIN) 300 MG capsule   Oral   Take 300 mg by mouth 3 (three) times daily.         . hydrALAZINE (APRESOLINE) 25 MG tablet   Oral   Take 25 mg by mouth 3 (three) times daily.         . isosorbide mononitrate (ISMO,MONOKET) 10 MG tablet   Oral   Take 10 mg by mouth 2 (two) times daily.         Marland Kitchen losartan (COZAAR) 100 MG tablet   Oral   Take 100 mg by mouth every morning.          . magnesium oxide (MAG-OX) 400 MG tablet   Oral   Take 400 mg by mouth 2 (two) times daily.         . metoprolol tartrate (LOPRESSOR) 25 MG tablet    Oral   Take 12.5 mg by mouth 2 (two) times daily.         Marland Kitchen oxyCODONE (OXY IR/ROXICODONE) 5 MG immediate release tablet   Oral   Take 20 mg by mouth 2 (two) times daily. For pain         . OxyCODONE (OXYCONTIN) 10 mg T12A   Oral   Take 10 mg by mouth every 12 (twelve) hours.         . torsemide (DEMADEX) 20 MG tablet   Oral   Take 2 tablets (40 mg total) by mouth 2 (two) times daily.   120 tablet   5   . Vitamin D, Ergocalciferol, (DRISDOL) 50000 UNITS CAPS   Oral   Take 50,000 Units by mouth every Thursday.         . zolpidem (AMBIEN) 5 MG tablet   Oral   Take 5 mg by mouth at bedtime as needed. For sleep          BP 132/57  Pulse 80  Temp(Src) 98 F (36.7 C) (Oral)  Resp 18  Ht 5\' 6"  (1.676 m)  Wt 323 lb (146.512 kg)  BMI 52.16 kg/m2  SpO2 98%  Physical Exam  Nursing note and vitals reviewed. Constitutional: She is oriented to person, place, and time. No distress.  Morbidly obese  HENT:  Head: Normocephalic and atraumatic.  Mouth/Throat: Uvula is midline and oropharynx is clear and moist.  Mildly dry mucus membranes  Eyes: Conjunctivae and EOM are normal. Pupils are equal, round, and reactive to light.  Neck: Normal range of motion.  Cardiovascular: Normal rate, regular rhythm and normal heart sounds.   Pulmonary/Chest: Effort normal and breath sounds normal.  Abdominal: Soft. Bowel sounds are normal. There is tenderness in the right upper quadrant and epigastric area. There is no guarding.  Musculoskeletal: Normal range of motion. She exhibits edema.  Lymphedema BLE  Neurological: She is alert and oriented to person, place, and time.  Skin: Skin is warm and dry. She is not diaphoretic.  Psychiatric: She has a normal mood and affect.    ED Course  Procedures (including critical care time) Labs  Review Labs Reviewed  CBC WITH DIFFERENTIAL - Abnormal; Notable for the following:    RBC 3.62 (*)    Hemoglobin 10.1 (*)    HCT 31.1 (*)    RDW 16.4  (*)    All other components within normal limits  COMPREHENSIVE METABOLIC PANEL - Abnormal; Notable for the following:    Glucose, Bld 111 (*)    BUN 84 (*)    Creatinine, Ser 2.92 (*)    Albumin 2.6 (*)    AST 57 (*)    ALT 60 (*)    Alkaline Phosphatase 158 (*)    GFR calc non Af Amer 16 (*)    GFR calc Af Amer 18 (*)    All other components within normal limits  LIPASE, BLOOD  URINALYSIS, ROUTINE W REFLEX MICROSCOPIC  SODIUM, URINE, RANDOM  CREATININE, URINE, RANDOM   Imaging Review US Abdomen Complete  05/01/2013   CLINICAL DATA:  Right upper quadrant abdominal pain. Nausea and vomiting.  EXAM: ABDOMEN ULTRASOUND  COMPARISON:  08/14/2012  FINDINGS: Gallbladder  No gallstones or wall thickening. Negative sonographic Murphy's sign.  Common bile duct  Diameter: 5 mm  Liver  No focal lesion identified. Poor sonic penetration with heterogeneous echotexture. Poorly seen secondary to body habitus.  IVC  No abnormality visualized. Poorly seen secondary to body habitus.  Pancreas  Visualized portion unremarkable. Poorly seen secondary to body habitus.  Spleen  Size and appearance within normal limits. Poorly seen secondary to body habitus.  Right Kidney  Length: 10.8 cm Echogenicity within normal limits. No mass or hydronephrosis visualized. Poorly seen secondary to body habitus.  Left Kidney  Length: 11.5 cm Echogenicity within normal limits. No mass or hydronephrosis visualized. Poorly seen secondary to body habitus.  Abdominal aorta  No aneurysm visualized. Iliac vessels obscured by overlying bowel gas. Poorly seen secondary to body habitus.  IMPRESSION: 1. Reduced sensitivity due to body habitus causing poor visualization of various structures. Possible hepatic steatosis. Otherwise no definite abnormality observed.   Electronically Signed   By: Herbie Baltimore   On: 05/01/2013 18:59    MDM   1. Acute on chronic renal failure   2. Chronic diastolic heart failure   3. Nausea with vomiting    4. Morbid obesity   5. HYPERLIPIDEMIA   6. HYPERTENSION   7. SLEEP APNEA     U/s negative for acute findings-- no gallstones or sonographic murphys sign, no hydronephrosis.  Labs as above-- no leukocytosis but AKI present with BUN/Cr 84/2.92 compared with 44/1.64 March 2014.  Pt states she still makes large amount of urine daily, however pt on daily demedex.  U/a without signs of infection.  Discussed with hospitalist, Dr. Cena Benton, pt will be admitted.  VS stable.  Garlon Hatchet, PA-C 05/01/13 2207  Garlon Hatchet, PA-C 05/01/13 2208

## 2013-05-01 NOTE — ED Notes (Signed)
Per EMS, patient called EMS after her Nurse Practitioner suggested she go to the hospital. Patient having nausea, vomiting, and diarrhea for approximately 2 weeks.

## 2013-05-01 NOTE — H&P (Signed)
[facility] History and Physical  MYYA MEENACH UJW:[id number] DOB: November 30, 1945 DOA: 05/01/2013  Referring physician: Dr. Rhunette Croft PCP: Marletta Lor, NP  Specialists: none  Chief Complaint: N/V/Diarrhea  HPI: Kelly [name] is a 67 y.o. female  With PMH significant for DM currently diet controlled per patient, HTN,  Lymphedema, CHF and Depression.  Patient reports that her sister died 2 weeks ago.  Since then she has had non bloody diarrhea, nausea and emesis. She reports her appetite has been poor for the last month but for the last 2 weeks it has been worse.  The emesis has been non bloody and non bilious she is unsure how many times she has had emesis in the last several days but does not report it to be numerous.  Despite her poor oral intake patient reports that she has taken her medications daily.  While in the ED patient was found to have an elevated creatinine level above baseline and we were consulted for further admission evaluation and recommendations.  Review of Systems: 10 point review of systems reviewed and negative unless otherwise mentioned above.  Past Medical History  Diagnosis Date  . Hypertension   . Diabetes mellitus   . Hyperlipidemia   . CHF (congestive heart failure)   . GERD (gastroesophageal reflux disease)   . Depression   . Sleep apnea     doesn't use cpap machine or 02 at night  . Headache(784.0)     Migraines  . Arthritis     knees  . Anemia     Had blood transfusion x 2 in yhe 90s  . Lymphedema of leg    Past Surgical History  Procedure Laterality Date  . Rotator cuff repair      right  . Knee arthroscopy      right  . Temple biopsy    . Colonoscopy with propofol  09/23/2012    Procedure: COLONOSCOPY WITH PROPOFOL;  Surgeon: Beverley Fiedler, MD;  Location: [facility];  Service: Gastroenterology;  Laterality: N/A;   Social History:  reports that she quit smoking about 31 years ago. Her smoking use included Cigarettes. She smoked 0.00  packs per day. She has never used smokeless tobacco. She reports that she does not drink alcohol or use illicit drugs.  where does patient live--home, ALF, SNF? and with whom if at home? Lives at home  Can patient participate in ADLs? yes  Allergies  Allergen Reactions  . Penicillins Hives    Family History  Problem Relation Age of Onset  . Emphysema Father   . Asthma Brother   . Heart disease Mother   . Stomach cancer Brother   . Heart disease Maternal Grandmother   . Diabetes Mother   . Diabetes Sister   None other reported  Prior to Admission medications   Medication Sig Start Date End Date Taking? Authorizing Provider  allopurinol (ZYLOPRIM) 100 MG tablet Take 100 mg by mouth 2 (two) times daily.   Yes Historical Provider, MD  ALPRAZolam (XANAX) 0.25 MG tablet Take 0.25 mg by mouth 2 (two) times daily as needed for anxiety.   Yes Historical Provider, MD  aspirin 325 MG EC tablet Take 325 mg by mouth daily.   Yes Historical Provider, MD  atorvastatin (LIPITOR) 40 MG tablet Take 40 mg by mouth at bedtime.   Yes Historical Provider, MD  colchicine 0.6 MG tablet Take 0.6 mg by mouth 2 (two) times daily.   Yes Historical Provider, MD  esomeprazole (NEXIUM) 40  MG capsule Take 40 mg by mouth daily before breakfast.   Yes Historical Provider, MD  gabapentin (NEURONTIN) 300 MG capsule Take 300 mg by mouth 3 (three) times daily.   Yes Historical Provider, MD  hydrALAZINE (APRESOLINE) 25 MG tablet Take 25 mg by mouth 3 (three) times daily.   Yes Historical Provider, MD  isosorbide mononitrate (ISMO,MONOKET) 10 MG tablet Take 10 mg by mouth 2 (two) times daily.   Yes Historical Provider, MD  losartan (COZAAR) 100 MG tablet Take 100 mg by mouth every morning.    Yes Historical Provider, MD  magnesium oxide (MAG-OX) 400 MG tablet Take 400 mg by mouth 2 (two) times daily.   Yes Historical Provider, MD  metolazone (ZAROXOLYN) 5 MG tablet Take 5 mg by mouth every morning. Prior to morning  torsemide   Yes Historical Provider, MD  metoprolol tartrate (LOPRESSOR) 25 MG tablet Take 12.5 mg by mouth 2 (two) times daily.   Yes Historical Provider, MD  OxyCODONE (OXYCONTIN) 10 mg T12A Take 20-40 mg by mouth every 6 (six) hours as needed (pain).    Yes Historical Provider, MD  torsemide (DEMADEX) 20 MG tablet Take 2 tablets (40 mg total) by mouth 2 (two) times daily. 11/25/12  Yes Vesta Mixer, MD  Vitamin D, Ergocalciferol, (DRISDOL) 50000 UNITS CAPS Take 50,000 Units by mouth every Thursday.   Yes Historical Provider, MD  oxyCODONE (OXY IR/ROXICODONE) 5 MG immediate release tablet Take 20 mg by mouth 2 (two) times daily. For pain    Historical Provider, MD  zolpidem (AMBIEN) 5 MG tablet Take 5 mg by mouth at bedtime as needed. For sleep 07/28/12   Historical Provider, MD   Physical Exam: Filed Vitals:   05/01/13 2131  BP: 126/48  Pulse: 73  Temp: 98.1 F (36.7 C)  Resp: 18     General:  Pt in NAD, Alert and Awake  Eyes: EOMI, non icteric  ENT: normal exterior appearance, dry mucous membranes  Neck: supple, no goiter  Cardiovascular: RRR, no MRG  Respiratory: CTA BL, no wheezes  Abdomen: soft, diffuse tenderness on palpation, no rebound tenderness  Skin: warm and dry   Musculoskeletal: no cyanosis or clubbing  Psychiatric: mood and affect appropriate  Neurologic: answers questions appropriately, no facial asymmetry.  Labs on Admission:  Basic Metabolic Panel:  Recent Labs Lab 05/01/13 1900  NA 138  K 4.1  CL 101  CO2 26  GLUCOSE 111*  BUN 84*  CREATININE 2.92*  CALCIUM 8.5   Liver Function Tests:  Recent Labs Lab 05/01/13 1900  AST 57*  ALT 60*  ALKPHOS 158*  BILITOT 0.4  PROT 7.1  ALBUMIN 2.6*    Recent Labs Lab 05/01/13 1900  LIPASE 13   No results found for this basename: AMMONIA,  in the last 168 hours CBC:  Recent Labs Lab 05/01/13 1900  WBC 5.4  NEUTROABS 3.6  HGB 10.1*  HCT 31.1*  MCV 85.9  PLT 181   Cardiac  Enzymes: No results found for this basename: CKTOTAL, CKMB, CKMBINDEX, TROPONINI,  in the last 168 hours  BNP (last 3 results)  Recent Labs  11/17/12 1739  PROBNP 565.7*   CBG: No results found for this basename: GLUCAP,  in the last 168 hours  Radiological Exams on Admission: US Abdomen Complete  05/01/2013   CLINICAL DATA:  Right upper quadrant abdominal pain. Nausea and vomiting.  EXAM: ABDOMEN ULTRASOUND  COMPARISON:  08/14/2012  FINDINGS: Gallbladder  No gallstones or wall thickening. Negative  sonographic Murphy's sign.  Common bile duct  Diameter: 5 mm  Liver  No focal lesion identified. Poor sonic penetration with heterogeneous echotexture. Poorly seen secondary to body habitus.  IVC  No abnormality visualized. Poorly seen secondary to body habitus.  Pancreas  Visualized portion unremarkable. Poorly seen secondary to body habitus.  Spleen  Size and appearance within normal limits. Poorly seen secondary to body habitus.  Right Kidney  Length: 10.8 cm Echogenicity within normal limits. No mass or hydronephrosis visualized. Poorly seen secondary to body habitus.  Left Kidney  Length: 11.5 cm Echogenicity within normal limits. No mass or hydronephrosis visualized. Poorly seen secondary to body habitus.  Abdominal aorta  No aneurysm visualized. Iliac vessels obscured by overlying bowel gas. Poorly seen secondary to body habitus.  IMPRESSION: 1. Reduced sensitivity due to body habitus causing poor visualization of various structures. Possible hepatic steatosis. Otherwise no definite abnormality observed.   Electronically Signed   By: Herbie Baltimore   On: 05/01/2013 18:59    Assessment/Plan Active Problems:   1. Acute on Chronic renal failure - Baseline creatinine at 1.6 - most likely prerenal given history of poor oral intake, diarrhea, nausea/emesis as well as continuation of loop diuretic and arb - Hold loop diuretic and ARB - agree with urine sodium and creatinine - U/S of abdomen  obtained and reports no mass or hydronephrosis of right or left kidney - Should creatinine not improve with IVF's would plan on consulting nephro next am for further evaluation and recommendations  2. Nausea/Emesis - treat supportively.  Could be part of a viral gastroenteritis vs worsening renal function due to loop diuretic and poor po intake - IVF's and antiemetics  3. Diarrhea - most likely a viral gastroenteritis - stool studies and culture - c diff pcr although no reports of recent antibiotics - enteral contact precautions - will check magnesium and phosphorus levels  4. DM - not on any hypoglycemic agents - Diabetic diet as tolerated - SSI  5. CHF - Will hold ARB and Loop diuretic due to # 1 - hydrate overnight and reassess fluid status in the AM - Otherwise continue B blocker and isosorbide mononitrate  6. HPL - Hold statin given mild elevation in AST and ALT  Code Status: full Family Communication: no family at bedside. Disposition Plan: Pending improvement in condition. To med surg with IVF's to reassess S creatinine next am. And await further test results.  Time spent: > 60 minutes  Penny Pia [facility] Pager [phone]  If 7PM-7AM, please contact night-coverage [email or web address] Password [facility] 05/01/2013, 9:49 PM

## 2013-05-02 LAB — GLUCOSE, CAPILLARY
Glucose-Capillary: 144 mg/dL — ABNORMAL HIGH (ref 70–99)
Glucose-Capillary: 153 mg/dL — ABNORMAL HIGH (ref 70–99)

## 2013-05-02 LAB — CBC
Hemoglobin: 9.1 g/dL — ABNORMAL LOW (ref 12.0–15.0)
Platelets: 155 10*3/uL (ref 150–400)
RBC: 3.16 MIL/uL — ABNORMAL LOW (ref 3.87–5.11)
WBC: 5 10*3/uL (ref 4.0–10.5)

## 2013-05-02 LAB — BASIC METABOLIC PANEL
Calcium: 8.4 mg/dL (ref 8.4–10.5)
GFR calc non Af Amer: 18 mL/min — ABNORMAL LOW (ref >90)
Glucose, Bld: 121 mg/dL — ABNORMAL HIGH (ref 70–99)
Potassium: 3.7 mEq/L (ref 3.5–5.1)
Sodium: 139 mEq/L (ref 135–145)

## 2013-05-02 LAB — MAGNESIUM: Magnesium: 1.7 mg/dL (ref 1.5–2.5)

## 2013-05-02 LAB — SODIUM, URINE, RANDOM: Sodium, Ur: 35 mEq/L

## 2013-05-02 LAB — TSH: TSH: 1.051 u[IU]/mL (ref 0.350–4.500)

## 2013-05-02 LAB — PHOSPHORUS: Phosphorus: 4.4 mg/dL (ref 2.3–4.6)

## 2013-05-02 LAB — CREATININE, URINE, RANDOM: Creatinine, Urine: 63 mg/dL

## 2013-05-02 MED ORDER — ALLOPURINOL 100 MG PO TABS
100.0000 mg | ORAL_TABLET | Freq: Two times a day (BID) | ORAL | Status: DC
  Administered 2013-05-02 – 2013-05-05 (×6): 100 mg via ORAL
  Filled 2013-05-02 (×7): qty 1

## 2013-05-02 MED ORDER — CHLORHEXIDINE GLUCONATE CLOTH 2 % EX PADS
6.0000 | MEDICATED_PAD | Freq: Every day | CUTANEOUS | Status: DC
  Administered 2013-05-03 – 2013-05-04 (×2): 6 via TOPICAL

## 2013-05-02 MED ORDER — MUPIROCIN 2 % EX OINT
1.0000 "application " | TOPICAL_OINTMENT | Freq: Two times a day (BID) | CUTANEOUS | Status: DC
  Administered 2013-05-02 – 2013-05-05 (×7): 1 via NASAL
  Filled 2013-05-02: qty 22

## 2013-05-02 MED ORDER — COLCHICINE 0.6 MG PO TABS
0.6000 mg | ORAL_TABLET | Freq: Two times a day (BID) | ORAL | Status: DC
  Administered 2013-05-02 – 2013-05-05 (×6): 0.6 mg via ORAL
  Filled 2013-05-02 (×7): qty 1

## 2013-05-02 NOTE — Progress Notes (Signed)
[facility] PROGRESS NOTE  Kelly [name] RUE:[id number] DOB: 1945/11/23 DOA: 05/01/2013 PCP: Marletta Lor, NP  Brief narrative: 67 year old female with past medical history of DM, HTN, Lymphedema, CHF and Depression who presented to [facility] ED with nausea, vomiting and diarrhea. In addition, pt reported poor oral intake. While in the ED patient was found to have an elevated creatinine level above baseline 2.9 compared to 1.66). Symptoms controlled with IV fluids and antiemetics.  Assessment/Plan:  Principal Problem:   Nausea with vomiting, Diarrhea   - resolved  - likely viral gastroenteritis - continue supportive care with IV fluids and antiemetics Active Problems:   Acute on chronic renal failure - baseline creatinine around 1.66 and on this admission 2.92 - likely due to prerenal etiology, dehydration or losartan and diuretics which were held on the admission - continue IV fluids - follow up BMP in am   Anemia of chronic disease - secondary to chronic kidney disease - hemoglobin 10.1 - no indications for transfusion   HYPERTENSION - continue Imdur, hydralazine and metoprolol   Diabetes mellitus, 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 - last A1c in 09/2012 - 6.0 indicating good glycemic control   Diabetic neuropathy - continue gabapentin   Morbid obesity - nutrition consulted    Chronic diastolic heart failure - last 2 D ECHO in 2013 with EF 60% - BNP around 600 in 10/2012   Code Status: full code Family Communication: no family at the bedside Disposition Plan: home when stable  Manson Passey, MD  [facility] Pager [phone]  If 7PM-7AM, please contact night-coverage [email or web address] Password [email or web address] 05/02/2013, 6:29 AM   LOS: 1 day   Consultants:  Wound care consult  Procedures:  None   Antibiotics:  None   HPI/Subjective: No acute overnight events. No diarrhea since admission.   Objective: Filed Vitals:   05/01/13 1710 05/01/13  2131 05/01/13 2300 05/02/13 0549  BP: 132/57 126/48 127/53 102/49  Pulse: 80 73 70 70  Temp: 98 F (36.7 C) 98.1 F (36.7 C) 97.9 F (36.6 C) 98.5 F (36.9 C)  TempSrc: Oral Oral Oral Oral  Resp: 18 18  18   Height: 5\' 6"  (1.676 m)  5\' 5"  (1.651 m)   Weight: 146.512 kg (323 lb)  148 kg (326 lb 4.5 oz)   SpO2: 98% 99% 100% 94%    Intake/Output Summary (Last 24 hours) at 05/02/13 0629 Last data filed at 05/02/13 0550  Gross per 24 hour  Intake    240 ml  Output   1600 ml  Net  -1360 ml    Exam:   General:  Pt is alert, follows commands appropriately, not in acute distress  Cardiovascular: Regular rate and rhythm, S1/S2 appreciated  Respiratory: Clear to auscultation bilaterally, no wheezing, no crackles, no rhonchi  Abdomen: Soft, obese, non tender, non distended, bowel sounds present, no guarding  Extremities: LE edema, pulses DP and PT palpable bilaterally  Neuro: Grossly nonfocal  Data Reviewed: Basic Metabolic Panel:  Recent Labs Lab 05/01/13 1900 05/02/13 0445  NA 138 139  K 4.1 3.7  CL 101 104  CO2 26 29  GLUCOSE 111* 121*  BUN 84* 75*  CREATININE 2.92* 2.65*  CALCIUM 8.5 8.4  MG  --  1.7  PHOS  --  4.4   Liver Function Tests:  Recent Labs Lab 05/01/13 1900  AST 57*  ALT 60*  ALKPHOS 158*  BILITOT 0.4  PROT 7.1  ALBUMIN 2.6*    Recent Labs  Lab 05/01/13 1900  LIPASE 13   No results found for this basename: AMMONIA,  in the last 168 hours CBC:  Recent Labs Lab 05/01/13 1900 05/02/13 0445  WBC 5.4 5.0  NEUTROABS 3.6  --   HGB 10.1* 9.1*  HCT 31.1* 27.6*  MCV 85.9 87.3  PLT 181 155   Cardiac Enzymes: No results found for this basename: CKTOTAL, CKMB, CKMBINDEX, TROPONINI,  in the last 168 hours BNP: No components found with this basename: POCBNP,  CBG: No results found for this basename: GLUCAP,  in the last 168 hours  Recent Results (from the past 240 hour(s))  MRSA PCR SCREENING     Status: Abnormal   Collection Time     05/02/13 12:23 AM      Result Value Range Status   MRSA by PCR POSITIVE (*) NEGATIVE Final   Comment:            The GeneXpert MRSA Assay (FDA     approved for NASAL specimens     only), is one component of a     comprehensive MRSA colonization     surveillance program. It is not     intended to diagnose MRSA     infection nor to guide or     monitor treatment for     MRSA infections.     RESULT CALLED TO, READ BACK BY AND VERIFIED WITH:     J DODSON AT 0147 ON 09.06.2014 BY NBROOKS     Studies: US Abdomen Complete  05/01/2013   CLINICAL DATA:  Right upper quadrant abdominal pain. Nausea and vomiting.  EXAM: ABDOMEN ULTRASOUND  COMPARISON:  08/14/2012  FINDINGS: Gallbladder  No gallstones or wall thickening. Negative sonographic Murphy's sign.  Common bile duct  Diameter: 5 mm  Liver  No focal lesion identified. Poor sonic penetration with heterogeneous echotexture. Poorly seen secondary to body habitus.  IVC  No abnormality visualized. Poorly seen secondary to body habitus.  Pancreas  Visualized portion unremarkable. Poorly seen secondary to body habitus.  Spleen  Size and appearance within normal limits. Poorly seen secondary to body habitus.  Right Kidney  Length: 10.8 cm Echogenicity within normal limits. No mass or hydronephrosis visualized. Poorly seen secondary to body habitus.  Left Kidney  Length: 11.5 cm Echogenicity within normal limits. No mass or hydronephrosis visualized. Poorly seen secondary to body habitus.  Abdominal aorta  No aneurysm visualized. Iliac vessels obscured by overlying bowel gas. Poorly seen secondary to body habitus.  IMPRESSION: 1. Reduced sensitivity due to body habitus causing poor visualization of various structures. Possible hepatic steatosis. Otherwise no definite abnormality observed.   Electronically Signed   By: Herbie Baltimore   On: 05/01/2013 18:59    Scheduled Meds: . aspirin  325 mg Oral Daily  . Chlorhexidine Gluconate Cloth  6 each Topical  Q0600  . gabapentin  300 mg Oral TID  . heparin  5,000 Units Subcutaneous Q8H  . hydrALAZINE  25 mg Oral TID  . insulin aspart  0-15 Units Subcutaneous TID WC  . isosorbide mononitrate  10 mg Oral BID  . magnesium oxide  400 mg Oral BID  . metoprolol tartrate  12.5 mg Oral BID  . mupirocin ointment  1 application Nasal BID  . pantoprazole  80 mg Oral Q1200   Continuous Infusions: . sodium chloride 100 mL/hr at 05/01/13 2300

## 2013-05-02 NOTE — Progress Notes (Signed)
Patient admitted to room 1313 from ED with n/v/d and ARF. Orders for contact precautions to r/o c diff. Patient oriented to room and unit. Verbalized understanding and demonstrated use of call bell and bed. Patient with foley in place. Patient also with wounds to posterior thighs bilaterally.

## 2013-05-03 LAB — CBC
MCH: 27.9 pg (ref 26.0–34.0)
MCV: 87.4 fL (ref 78.0–100.0)
Platelets: 166 10*3/uL (ref 150–400)
RDW: 16.6 % — ABNORMAL HIGH (ref 11.5–15.5)

## 2013-05-03 LAB — BASIC METABOLIC PANEL
CO2: 26 mEq/L (ref 19–32)
Calcium: 8.2 mg/dL — ABNORMAL LOW (ref 8.4–10.5)
Creatinine, Ser: 1.86 mg/dL — ABNORMAL HIGH (ref 0.50–1.10)
GFR calc Af Amer: 31 mL/min — ABNORMAL LOW (ref >90)

## 2013-05-03 LAB — GLUCOSE, CAPILLARY
Glucose-Capillary: 98 mg/dL (ref 70–99)
Glucose-Capillary: 122 mg/dL — ABNORMAL HIGH (ref 70–99)

## 2013-05-03 MED ORDER — ALUM & MAG HYDROXIDE-SIMETH 200-200-20 MG/5ML PO SUSP
30.0000 mL | Freq: Four times a day (QID) | ORAL | Status: AC | PRN
  Administered 2013-05-04: 30 mL via ORAL
  Filled 2013-05-03 (×2): qty 30

## 2013-05-03 NOTE — Evaluation (Addendum)
Physical Therapy Evaluation Patient Details Name: Kelly [name] MRN: [id number] DOB: 28-Jul-1946 Today's Date: 05/03/2013 Time: 1191-4782 PT Time Calculation (min): 56 min  PT Assessment / Plan / Recommendation History of Present Illness  Pt  Admitted 05/01/13  with N/V/D, acute on chronic renal failure.  Clinical Impression  Pt was able to ambulate short distance in room. Pt is very weak and will benefit from T while in acute care to improve in functional independence as pt lives alone.     PT Assessment  Patient needs continued PT services    Follow Up Recommendations  SNF    Does the patient have the potential to tolerate intense rehabilitation      Barriers to Discharge Decreased caregiver support      Equipment Recommendations  None recommended by PT    Recommendations for Other Services OT consult   Frequency Min 3X/week    Precautions / Restrictions Precautions Precautions: Fall   Pertinent Vitals/Pain Pt c/o abdominal pain. MD in to address.      Mobility  Bed Mobility Bed Mobility: Supine to Sit;Sitting - Scoot to Edge of Bed Supine to Sit: 4: Min assist;With rails;HOB elevated Sitting - Scoot to [place] of Bed: 4: Min assist Details for Bed Mobility Assistance: extra time for moving to sitting, pt has sores on posterior thighs each leg  Transfers Transfers: Sit to Stand;Stand to Sit Sit to Stand: From bed;1: +2 Total assist;From toilet;With armrests;With upper extremity assist Sit to Stand: Patient Percentage: 70% Stand to Sit: With upper extremity assist;To toilet;To chair/3-in-1;With armrests Details for Transfer Assistance: extra time to rise from surface. Pt places RW in a off center to body due to not being able to get het legs closs due to body habitus. Ambulation/Gait Ambulation/Gait Assistance: 1: +2 Total assist Ambulation/Gait: Patient Percentage: 70% Ambulation Distance (Feet): 20 Feet (x 2) Assistive device: Rolling walker Ambulation/Gait  Assistance Details: extra time for activity. Pt leans forward on RW, unable to stand erect. Gait Pattern: Step-through pattern;Trunk flexed;Decreased stride length Gait velocity: decreased    Exercises     PT Diagnosis: Difficulty walking;Generalized weakness  PT Problem List: Decreased strength;Decreased activity tolerance;Decreased mobility;Decreased knowledge of use of DME;Decreased knowledge of precautions;Impaired sensation;Decreased skin integrity;Pain PT Treatment Interventions: DME instruction;Gait training;Functional mobility training;Therapeutic activities;Therapeutic exercise;Patient/family education     PT Goals(Current goals can be found in the care plan section) Acute Rehab PT Goals Patient Stated Goal: I want to get stronger and go home PT Goal Formulation: With patient Time For Goal Achievement: 05/17/13 Potential to Achieve Goals: Good  Visit Information  Last PT Received On: 05/03/13 Assistance Needed: +2 History of Present Illness: admite with N/V/D, acute on chronic renal failure.       Prior Functioning  Home Living Family/patient expects to be discharged to:: Private residence Living Arrangements: Alone Available Help at Discharge: Family;Friend(s) Type of Home: Apartment Home Access: Level entry Home Layout: One level Home Equipment: Walker - 2 wheels;Shower seat;Electric scooter, hospital bed.  Additional Comments: scooter not beneficial. Prior Function Level of Independence: Independent with assistive device(s) Communication Communication: No difficulties    Cognition  Cognition Arousal/Alertness: Awake/alert Behavior During Therapy: WFL for tasks assessed/performed Overall Cognitive Status: Within Functional Limits for tasks assessed    Extremity/Trunk Assessment Upper Extremity Assessment Upper Extremity Assessment: Generalized weakness Lower Extremity Assessment Lower Extremity Assessment: Generalized weakness (but WFL for ambulation short  distance)   Balance Balance Balance Assessed: Yes Static Sitting Balance Static Sitting - Balance Support: No upper  extremity supported Static Sitting - Level of Assistance: 7: Independent  End of Session PT - End of Session Activity Tolerance: Patient tolerated treatment well Patient left: in chair;with call bell/phone within reach Nurse Communication: Mobility status  GP     Rada Hay 05/03/2013, 10:21 AM Blanchard Kelch PT [phone]

## 2013-05-03 NOTE — Progress Notes (Signed)
[facility] PROGRESS NOTE  Kelly [name]:[id number] DOB: 1946/01/25 DOA: 05/01/2013 PCP: Marletta Lor, NP  Brief narrative: 67 year old female with past medical history of DM, HTN, Lymphedema, CHF and Depression who presented to [facility] ED with nausea, vomiting and diarrhea. In addition, pt reported poor oral intake.  While in the ED patient was found to have an elevated creatinine level above baseline 2.9 compared to 1.66). Symptoms controlled with IV fluids and antiemetics.   Assessment/Plan:   Principal Problem:  Nausea with vomiting, Diarrhea  - likely viral gastroenteritis  - all symptoms resolved at this point - advance diet as tolerated - continue supportive care with IV fluids and antiemetics as needed Active Problems:  Acute on chronic renal failure  - baseline creatinine around 1.66 and on this admission 2.92  - likely due to prerenal etiology, dehydration or losartan and diuretics which were held on the admission  - creatinine this am 1.86, trending down with IV fluids Anemia of chronic disease  - secondary to chronic kidney disease  - hemoglobin ranges 9.3 to 10.1; stable - no indications for transfusion  HYPERTENSION  - continue Imdur, hydralazine and metoprolol  Diabetes mellitus, 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  - last A1c in 09/2012 - 6.0 indicating good glycemic control  Diabetic neuropathy  - continue gabapentin  Morbid obesity  - nutrition consulted  Chronic diastolic heart failure  - last 2 D ECHO in 2013 with EF 60%  - BNP around 600 in 10/2012   Code Status: full code  Family Communication: no family at the bedside  Disposition Plan: plan for SNF placement  Kelly Passey, MD  [facility]  Pager [phone]   Consultants:  Wound care consult Procedures:  None  Antibiotics:  None    If 7PM-7AM, please contact night-coverage [email or web address] Password [email or web address] 05/03/2013, 9:15 AM   LOS: 2 days     HPI/Subjective: Feels indigestion.  Objective: Filed Vitals:   05/02/13 2108 05/02/13 2129 05/02/13 2130 05/03/13 0535  BP: 112/47 90/37 90/37  135/59  Pulse: 65   76  Temp: 99 F (37.2 C)   98.6 F (37 C)  TempSrc: Oral   Oral  Resp: 16   16  Height:      Weight:      SpO2: 98%   97%    Intake/Output Summary (Last 24 hours) at 05/03/13 0915 Last data filed at 05/03/13 0535  Gross per 24 hour  Intake   2803 ml  Output   1550 ml  Net   1253 ml    Exam:   General:  Pt is alert, follows commands appropriately, not in acute distress  Cardiovascular: Regular rate and rhythm, S1/S2, no murmurs, no rubs, no gallops  Respiratory: Clear to auscultation bilaterally, no wheezing, no crackles, no rhonchi  Abdomen: Soft, non tender, non distended, bowel sounds present, no guarding  Extremities: No edema, pulses DP and PT palpable bilaterally  Neuro: Grossly nonfocal  Data Reviewed: Basic Metabolic Panel:  Recent Labs Lab 05/01/13 1900 05/02/13 0445  NA 138 139  K 4.1 3.7  CL 101 104  CO2 26 29  GLUCOSE 111* 121*  BUN 84* 75*  CREATININE 2.92* 2.65*  CALCIUM 8.5 8.4  MG  --  1.7  PHOS  --  4.4   Liver Function Tests:  Recent Labs Lab 05/01/13 1900  AST 57*  ALT 60*  ALKPHOS 158*  BILITOT 0.4  PROT 7.1  ALBUMIN 2.6*    Recent  Labs Lab 05/01/13 1900  LIPASE 13   No results found for this basename: AMMONIA,  in the last 168 hours CBC:  Recent Labs Lab 05/01/13 1900 05/02/13 0445 05/03/13 0804  WBC 5.4 5.0 4.9  NEUTROABS 3.6  --   --   HGB 10.1* 9.1* 9.3*  HCT 31.1* 27.6* 29.1*  MCV 85.9 87.3 87.4  PLT 181 155 166   Cardiac Enzymes: No results found for this basename: CKTOTAL, CKMB, CKMBINDEX, TROPONINI,  in the last 168 hours BNP: No components found with this basename: POCBNP,  CBG:  Recent Labs Lab 05/02/13 1157 05/02/13 1720 05/02/13 2215 05/03/13 0728  GLUCAP 144* 153* 139* 98    Recent Results (from the past 240  hour(s))  MRSA PCR SCREENING     Status: Abnormal   Collection Time    05/02/13 12:23 AM      Result Value Range Status   MRSA by PCR POSITIVE (*) NEGATIVE Final   Comment:            The GeneXpert MRSA Assay (FDA     approved for NASAL specimens     only), is one component of a     comprehensive MRSA colonization     surveillance program. It is not     intended to diagnose MRSA     infection nor to guide or     monitor treatment for     MRSA infections.     RESULT CALLED TO, READ BACK BY AND VERIFIED WITH:     J DODSON AT 0147 ON 09.06.2014 BY NBROOKS  CLOSTRIDIUM DIFFICILE BY PCR     Status: None   Collection Time    05/02/13  3:26 PM      Result Value Range Status   C difficile by pcr NEGATIVE  NEGATIVE Final   Comment: Performed at [facility]     Studies: US Abdomen Complete  05/01/2013   CLINICAL DATA:  Right upper quadrant abdominal pain. Nausea and vomiting.  EXAM: ABDOMEN ULTRASOUND  COMPARISON:  08/14/2012  FINDINGS: Gallbladder  No gallstones or wall thickening. Negative sonographic Murphy's sign.  Common bile duct  Diameter: 5 mm  Liver  No focal lesion identified. Poor sonic penetration with heterogeneous echotexture. Poorly seen secondary to body habitus.  IVC  No abnormality visualized. Poorly seen secondary to body habitus.  Pancreas  Visualized portion unremarkable. Poorly seen secondary to body habitus.  Spleen  Size and appearance within normal limits. Poorly seen secondary to body habitus.  Right Kidney  Length: 10.8 cm Echogenicity within normal limits. No mass or hydronephrosis visualized. Poorly seen secondary to body habitus.  Left Kidney  Length: 11.5 cm Echogenicity within normal limits. No mass or hydronephrosis visualized. Poorly seen secondary to body habitus.  Abdominal aorta  No aneurysm visualized. Iliac vessels obscured by overlying bowel gas. Poorly seen secondary to body habitus.  IMPRESSION: 1. Reduced sensitivity due to body habitus causing poor  visualization of various structures. Possible hepatic steatosis. Otherwise no definite abnormality observed.   Electronically Signed   By: Herbie Baltimore   On: 05/01/2013 18:59    Scheduled Meds: . allopurinol  100 mg Oral BID  . aspirin  325 mg Oral Daily  . Chlorhexidine Gluconate Cloth  6 each Topical Q0600  . colchicine  0.6 mg Oral BID  . gabapentin  300 mg Oral TID  . heparin  5,000 Units Subcutaneous Q8H  . hydrALAZINE  25 mg Oral TID  . insulin aspart  0-15 Units Subcutaneous TID WC  . isosorbide mononitrate  10 mg Oral BID  . magnesium oxide  400 mg Oral BID  . metoprolol tartrate  12.5 mg Oral BID  . mupirocin ointment  1 application Nasal BID  . pantoprazole  80 mg Oral Q1200   Continuous Infusions: . sodium chloride 100 mL/hr at 05/03/13 0540

## 2013-05-03 NOTE — Progress Notes (Addendum)
Clinical Social Work Department CLINICAL SOCIAL WORK PLACEMENT NOTE 05/03/2013  Patient:  Kelly [name], Kelly [name]  Account Number:  [id number] Admit date:  05/01/2013  Clinical Social Worker:  Doroteo Glassman  Date/time:  05/03/2013 12:13 PM  Clinical Social Work is seeking post-discharge placement for this patient at the following level of care:   SKILLED NURSING   (*CSW will update this form in Epic as items are completed)   05/03/2013  Patient/family provided with [facility] Department of Clinical Social Work's list of facilities offering this level of care within the geographic area requested by the patient (or if unable, by the patient's family).  05/03/2013  Patient/family informed of their freedom to choose among providers that offer the needed level of care, that participate in Medicare, Medicaid or managed care program needed by the patient, have an available bed and are willing to accept the patient.  05/03/2013  Patient/family informed of [facility]' ownership interest in [facility], as well as of the fact that they are under no obligation to receive care at this facility.  PASARR submitted to EDS on  PASARR number received from EDS on   FL2 transmitted to all facilities in geographic area requested by pt/family on  05/03/2013 FL2 transmitted to all facilities within larger geographic area on   Patient informed that his/her managed care company has contracts with or will negotiate with  certain facilities, including the following:     Patient/family informed of bed offers received:  05/04/2013 Patient chooses bed at [facility] Physician recommends and patient chooses bed at    Patient to be transferred to  on  [facility] on 05/05/2013 Patient to be transferred to facility by ambulance Sharin Mons)  The following physician request were entered in Epic:   Additional Comments:  Providence Crosby, Theresia Majors Clinical Social  Work [phone]   Jacklynn Lewis, MSW, [facility]  Clinical Social Work [phone]

## 2013-05-03 NOTE — Progress Notes (Signed)
Nutrition Education  RD consulted for nutrition education regarding weight loss.  Body mass index is 54.3 kg/(m^2). Pt meets criteria for morbid obesity based on current BMI.  Patient reports that she has been making an effort to lose weight over the last 7-8 months. She has lost about 60 pounds over that time, intentionally. She reports trying to make healthy choices and monitoring portion size.   Wt Readings from Last 10 Encounters:  05/01/13 326 lb 4.5 oz (148 kg)  01/05/13 280 lb 1.9 oz (127.062 kg)  11/25/12 370 lb 1.9 oz (167.885 kg)  11/20/12 367 lb 15.2 oz (166.9 kg)  09/23/12 379 lb (171.913 kg)  09/23/12 379 lb (171.913 kg)  08/12/12 376 lb (170.552 kg)  08/04/12 367 lb 12.8 oz (166.833 kg)  03/22/12 366 lb 8.1 oz (166.245 kg)  01/04/12 386 lb 11 oz (175.4 kg)    RD briefly reviewed the importance of serving sizes and discussed the importance of controlled and consistent intake throughout the day. Provided examples of ways to balance meals/snacks. Teach back method used.  Expect good compliance.  Current diet order is Carbohydrate Modified, patient is consuming approximately 100% of meals at this time. Labs and medications reviewed. No further nutrition interventions warranted at this time. RD contact information provided. If additional nutrition issues arise, please re-consult RD.  Linnell Fulling, RD, LDN Pager #: [phone] After-Hours Pager #: [phone]

## 2013-05-03 NOTE — Progress Notes (Signed)
Clinical Social Work Department BRIEF PSYCHOSOCIAL ASSESSMENT 05/03/2013  Patient:  Kelly [name], Kelly [name]     Account Number:  [id number]     Admit date:  05/01/2013  Clinical Social Worker:  Doroteo Glassman  Date/Time:  05/03/2013 12:10 PM  Referred by:  Physician  Date Referred:  05/03/2013 Referred for  SNF Placement   Other Referral:   Interview type:  Patient Other interview type:   Pt's nephew, Marilu Favre, at bedside    PSYCHOSOCIAL DATA Living Status:  ALONE Admitted from facility:   Level of care:   Primary support name:  Dolphus Jenny Primary support relationship to patient:  FAMILY Degree of support available:   adequate    CURRENT CONCERNS Current Concerns  Post-Acute Placement   Other Concerns:    SOCIAL WORK ASSESSMENT / PLAN Met with Pt to discuss current d/c plans.    Pt stated that she understands the need for SNF and is agreeable.  Pt reported that she was at [facility] for rehab within the past 2 years but that she'd like to explore other SNF options.  She gave CSW permission to begin the SNF search.    CSW provided Pt with a [place] SNF list and discussed the process.    CSW thanked Pt and her nephew for thier time.   Assessment/plan status:  Psychosocial Support/Ongoing Assessment of Needs Other assessment/ plan:   Information/referral to community resources:   SNF list    PATIENT'S/FAMILY'S RESPONSE TO PLAN OF CARE: Pt understands the need for SNF and is agreeable to placement.    Pt thanked CSW for time and assistance.   Providence Crosby, LCSWA Clinical Social Work [phone]

## 2013-05-04 ENCOUNTER — Inpatient Hospital Stay (HOSPITAL_COMMUNITY): Payer: Medicare Other

## 2013-05-04 LAB — GLUCOSE, CAPILLARY
Glucose-Capillary: 120 mg/dL — ABNORMAL HIGH (ref 70–99)
Glucose-Capillary: 126 mg/dL — ABNORMAL HIGH (ref 70–99)
Glucose-Capillary: 109 mg/dL — ABNORMAL HIGH (ref 70–99)

## 2013-05-04 MED ORDER — SUCRALFATE 1 G PO TABS
1.0000 g | ORAL_TABLET | Freq: Three times a day (TID) | ORAL | Status: DC
  Administered 2013-05-04 – 2013-05-05 (×5): 1 g via ORAL
  Filled 2013-05-04 (×8): qty 1

## 2013-05-04 NOTE — Progress Notes (Addendum)
CSW met with pt at bedside to provide SNF bed offers.  CSW provided SNF bed offers and pt is interested in [facility] and [facility] which both facilities are considering at this time.  CSW contacted both facilities and left voice messages in order to determine if either facility is able to offer pt a bed.  CSW to follow up with pt once CSW receives response from facilities.  Pt expressed that first choice would be [facility].  CSW to continue to follow and facilitate pt discharge needs when pt medically ready for discharge. Anticipate discharge tomorrow.  Addendum 3:57pm:  CSW notified pt that [facility] responded that they were unable to offer a bed.   CSW explained to pt that CSW is continuing to await response from [facility] and Rehab.  CSW explored with pt what would be option for SNF if [facility] is unable to offer a bed.  Pt stated that she would be agreeable to [facility] if [facility] is unable to offer.  CSW to await response from [facility].  CSW contacted [facility] who confirmed that they would be able to accept pt if pt chooses their facility.  CSW to continue to follow and facilitate pt discharge needs when pt medically ready for discharge.  Jacklynn Lewis, MSW, LCSWA  Clinical Social Work [phone]

## 2013-05-04 NOTE — Progress Notes (Signed)
Physical Therapy Treatment Patient Details Name: Kelly [name] MRN: [id number] DOB: 17-Oct-1945 Today's Date: 05/04/2013 Time: 4098-1191 PT Time Calculation (min): 29 min  PT Assessment / Plan / Recommendation  History of Present Illness Admitted with N/V/D, acute on chronic renal failure. wounds posterior legs   PT Comments   Progressing with mobility.   Follow Up Recommendations  SNF     Does the patient have the potential to tolerate intense rehabilitation     Barriers to Discharge        Equipment Recommendations  None recommended by PT    Recommendations for Other Services OT consult  Frequency Min 3X/week   Progress towards PT Goals Progress towards PT goals: Progressing toward goals  Plan Current plan remains appropriate    Precautions / Restrictions Precautions Precautions: Fall Restrictions Weight Bearing Restrictions: No   Pertinent Vitals/Pain No c/o pain during session    Mobility  Bed Mobility Bed Mobility: Supine to Sit Supine to Sit: HOB elevated;With rails;3: Mod assist Details for Bed Mobility Assistance: extra time for moving to sitting, pt has sores on posterior thighs each leg. Assist for trunk to upright. Utilzed bedpad to aid with scooting.  Transfers Transfers: Sit to Stand;Stand to Sit Sit to Stand: 1: +2 Total assist Sit to Stand: Patient Percentage: 80% Stand to Sit: 1: +2 Total assist Stand to Sit: Patient Percentage: 80% Details for Transfer Assistance: extra time to rise from surface. Pt places RW in a off center to body due to not being able to get het legs closs due to body habitus. Assist to rise, stabilize, control descent. Pt c/o mild dizziness.  Ambulation/Gait Ambulation/Gait Assistance: 1: +2 Total assist Ambulation/Gait: Patient Percentage: 80% Ambulation Distance (Feet): 55 Feet Assistive device: Rolling walker Ambulation/Gait Assistance Details: Slow gait speed. Flexed trunk. Fatigues easily. Dyspnea 3/4.  Gait Pattern:  Decreased stride length;Trunk flexed;Step-through pattern    Exercises     PT Diagnosis:    PT Problem List:   PT Treatment Interventions:     PT Goals (current goals can now be found in the care plan section)    Visit Information  Last PT Received On: 05/04/13 Assistance Needed: +2 (safety) History of Present Illness: admite with N/V/D, acute on chronic renal failure. wounds posterior legs    Subjective Data      Cognition  Cognition Arousal/Alertness: Awake/alert Behavior During Therapy: WFL for tasks assessed/performed Overall Cognitive Status: Within Functional Limits for tasks assessed    Balance     End of Session PT - End of Session Activity Tolerance: Patient tolerated treatment well Patient left: in chair;with call bell/phone within reach;with family/visitor present   GP     [name], MPT Pager: [phone]

## 2013-05-04 NOTE — Progress Notes (Signed)
[facility] PROGRESS NOTE  Kelly [name] ZOX:[id number] DOB: 10/27/1945 DOA: 05/01/2013 PCP: Marletta Lor, NP  Brief narrative: 67 year old female with past medical history of DM, HTN, Lymphedema, CHF and Depression who presented to [facility] ED with nausea, vomiting and diarrhea. In addition, pt reported poor oral intake.  While in the ED patient was found to have an elevated creatinine level above baseline 2.9 compared to 1.66). Symptoms controlled with IV fluids and antiemetics.   Assessment/Plan:   Principal Problem:  Nausea with vomiting, Diarrhea  - likely viral gastroenteritis  - all symptoms resolved - continue regular diet  Active Problems:  Acute on chronic renal failure  - baseline creatinine around 1.66 and on this admission 2.92  - likely due to prerenal etiology, dehydration or losartan and diuretics which were held on the admission  - creatinine this am 1.86, trending down with IV fluids  - follow up BMP in am Anemia of chronic disease  - secondary to chronic kidney disease  - hemoglobin ranges 9.3 to 10.1; stable  - no indications for transfusion  HYPERTENSION  - continue Imdur, hydralazine and metoprolol  Diabetes mellitus, 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  - last A1c in 09/2012 - 6.0 indicating good glycemic control  Diabetic neuropathy  - continue gabapentin  Morbid obesity  - nutrition consulted  Chronic diastolic heart failure  - last 2 D ECHO in 2013 with EF 60%  - BNP around 600 in 10/2012   Code Status: full code  Family Communication: no family at the bedside  Disposition Plan: plan for SNF in am   Manson Passey, MD  [facility]  Pager [phone]   Consultants:  Wound care consult Procedures:  None  Antibiotics:  None     If 7PM-7AM, please contact night-coverage [email or web address] Password [facility] 05/04/2013, 2:46 PM   LOS: 3 days    HPI/Subjective: Has abdominal pain, 3/10 in intensity.  Objective: Filed  Vitals:   05/03/13 1138 05/03/13 1500 05/03/13 2101 05/04/13 0506  BP: 121/50 118/48 141/55 148/52  Pulse:  80 64 67  Temp:  98 F (36.7 C) 98.8 F (37.1 C) 98.7 F (37.1 C)  TempSrc:  Oral Oral Oral  Resp:  16 16 16   Height:      Weight:      SpO2:  98% 100% 100%    Intake/Output Summary (Last 24 hours) at 05/04/13 1446 Last data filed at 05/04/13 1300  Gross per 24 hour  Intake 2431.33 ml  Output   2900 ml  Net -468.67 ml    Exam:   General:  Pt is alert, follows commands appropriately, not in acute distress  Cardiovascular: Regular rate and rhythm, S1/S2, no murmurs, no rubs, no gallops  Respiratory: Clear to auscultation bilaterally, no wheezing, no crackles, no rhonchi  Abdomen: Soft, non tender, non distended, bowel sounds present, no guarding  Extremities: No edema, pulses DP and PT palpable bilaterally  Neuro: Grossly nonfocal  Data Reviewed: Basic Metabolic Panel:  Recent Labs Lab 05/01/13 1900 05/02/13 0445 05/03/13 0804  NA 138 139 144  K 4.1 3.7 4.2  CL 101 104 111  CO2 26 29 26   GLUCOSE 111* 121* 103*  BUN 84* 75* 48*  CREATININE 2.92* 2.65* 1.86*  CALCIUM 8.5 8.4 8.2*  MG  --  1.7  --   PHOS  --  4.4  --    Liver Function Tests:  Recent Labs Lab 05/01/13 1900  AST 57*  ALT 60*  ALKPHOS 158*  BILITOT 0.4  PROT 7.1  ALBUMIN 2.6*    Recent Labs Lab 05/01/13 1900  LIPASE 13   No results found for this basename: AMMONIA,  in the last 168 hours CBC:  Recent Labs Lab 05/01/13 1900 05/02/13 0445 05/03/13 0804  WBC 5.4 5.0 4.9  NEUTROABS 3.6  --   --   HGB 10.1* 9.1* 9.3*  HCT 31.1* 27.6* 29.1*  MCV 85.9 87.3 87.4  PLT 181 155 166   Cardiac Enzymes: No results found for this basename: CKTOTAL, CKMB, CKMBINDEX, TROPONINI,  in the last 168 hours BNP: No components found with this basename: POCBNP,  CBG:  Recent Labs Lab 05/03/13 1226 05/03/13 1712 05/03/13 2220 05/04/13 0726 05/04/13 1249  GLUCAP 147* 122* 162*  79 126*    Recent Results (from the past 240 hour(s))  MRSA PCR SCREENING     Status: Abnormal   Collection Time    05/02/13 12:23 AM      Result Value Range Status   MRSA by PCR POSITIVE (*) NEGATIVE Final   Comment:            The GeneXpert MRSA Assay (FDA     approved for NASAL specimens     only), is one component of a     comprehensive MRSA colonization     surveillance program. It is not     intended to diagnose MRSA     infection nor to guide or     monitor treatment for     MRSA infections.     RESULT CALLED TO, READ BACK BY AND VERIFIED WITH:     J DODSON AT 0147 ON 09.06.2014 BY NBROOKS  STOOL CULTURE     Status: None   Collection Time    05/02/13  3:26 PM      Result Value Range Status   Specimen Description STOOL   Final   Special Requests NONE   Final   Culture     Final   Value: NO SUSPICIOUS COLONIES, CONTINUING TO HOLD     Performed at [facility]   Report Status PENDING   Incomplete  CLOSTRIDIUM DIFFICILE BY PCR     Status: None   Collection Time    05/02/13  3:26 PM      Result Value Range Status   C difficile by pcr NEGATIVE  NEGATIVE Final   Comment: Performed at [facility]     Studies: Dg Abd Portable 1v 05/04/2013     IMPRESSION: Non obstructed bowel gas pattern.   Electronically Signed   By: Augusto Gamble M.D.   On: 05/04/2013 12:53    Scheduled Meds: . allopurinol  100 mg Oral BID  . aspirin  325 mg Oral Daily  . Chlorhexidine Gluconate Cloth  6 each Topical Q0600  . colchicine  0.6 mg Oral BID  . gabapentin  300 mg Oral TID  . heparin  5,000 Units Subcutaneous Q8H  . hydrALAZINE  25 mg Oral TID  . insulin aspart  0-15 Units Subcutaneous TID WC  . isosorbide mononitrate  10 mg Oral BID  . magnesium oxide  400 mg Oral BID  . metoprolol tartrate  12.5 mg Oral BID  . mupirocin ointment  1 application Nasal BID  . pantoprazole  80 mg Oral Q1200  . sucralfate  1 g Oral TID WC & HS   Continuous Infusions: . sodium chloride 100  mL/hr at 05/04/13 0213

## 2013-05-04 NOTE — Consult Note (Signed)
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  Right, clean, pink, moist (granulating) Drainage (amount, consistency, odor) scant serosanguinous on left, scant serous on right Periwound:intact with some maceration on both. Dressing procedure/placement/frequency:I will implement a saline dressing to the defect on the left twice daily and a soft silicone dressing to the right twice weekly.  Patient would have an expedited outcome if she could limit her time OOB to chair while these ulcerations are healing as they are essential ischial ulcerations despite being on her posterior thighs (a result of her positioning in chair due to body habitus).  Patient understands recommendations. I will provide a low air loss bariatric bed while in house. I will not follow, but will remain available to this patient and to her nursing staff and medical team.  Please re-consult if needed. Thanks, Ladona Mow, MSN, RN, [facility], CWOCN [phone])

## 2013-05-05 LAB — GLUCOSE, CAPILLARY: Glucose-Capillary: 97 mg/dL (ref 70–99)

## 2013-05-05 MED ORDER — OXYCODONE HCL ER 10 MG PO T12A: 20.0000 mg | EXTENDED_RELEASE_TABLET | Freq: Four times a day (QID) | ORAL | Status: DC | PRN

## 2013-05-05 MED ORDER — OXYCODONE HCL 5 MG PO TABS: 20.0000 mg | ORAL_TABLET | Freq: Two times a day (BID) | ORAL | Status: DC

## 2013-05-05 MED ORDER — ALPRAZOLAM 0.25 MG PO TABS: 0.2500 mg | ORAL_TABLET | Freq: Two times a day (BID) | ORAL | Status: DC | PRN

## 2013-05-05 MED ORDER — HYDRALAZINE HCL 50 MG PO TABS: 50.0000 mg | ORAL_TABLET | Freq: Three times a day (TID) | ORAL | Status: DC

## 2013-05-05 MED ORDER — SUCRALFATE 1 G PO TABS: 1.0000 g | ORAL_TABLET | Freq: Three times a day (TID) | ORAL | Status: DC

## 2013-05-05 MED ORDER — INFLUENZA VAC SPLIT QUAD 0.5 ML IM SUSP: 0.5000 mL | INTRAMUSCULAR | Status: DC

## 2013-05-05 NOTE — Progress Notes (Addendum)
Pt for discharge to [facility].  CSW facilitated pt discharge needs including contacting facility, faxing pt d/c information via [facility], discussing with pt at bedside, providing RN phone number to call report, and scheduling ambulance transportation for pt at 4 pm as request of [facility] as 4 pm is when pt bed will be available at SNF (Service Request ID#: [id number]).  No further social work needs identified at this time.  CSW signing off.   Jacklynn Lewis, MSW, LCSWA  Clinical Social Work [phone]

## 2013-05-05 NOTE — Discharge Summary (Signed)
Physician Discharge Summary  Kelly [name] AVW:[id number] DOB: 08-17-1946 DOA: 05/01/2013  PCP: Marletta Lor, NP  Admit date: 05/01/2013 Discharge date: 05/05/2013  Recommendations for Outpatient Follow-up:  1. please note that you likely had viral gastroenteritis. He presented with nausea, vomiting and diarrhea which now has resolved. You have residual burning sensation in the abdomen/as also closed which could be because of gastritis or acid reflux. You can continue taking sucralfate 1 g 3 times a day for about one month. If no significant improvement and you may stop this medication. You may also continue Nexium.  2. wound care for ulcers - please check dressing/wound care section for instructions  3. recommendation is to stop Cozaar as he may have contributed to worsening kidney function. Creatinine is 1.86 at the time of discharge. Please recheck kidney function in about 3-4 days after the discharge to make sure it is stable.  4. you may continue taking diuretic for lower extremity swelling. Please continue to monitor kidney function as indicated to make sure kidney function remained stable  5. hemoglobin is 9.3 prior to discharge, stable. 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   Right, clean, pink, moist (granulating)  Drainage scant serosanguinous on left, scant serous on right  Periwound: intact with some maceration on both.  Dressing procedure/placement/frequency: saline dressing to the defect on the left twice daily and a soft silicone dressing to the right twice weekly.  Patient would have an expedited outcome if she could limit her time OOB to chair while these ulcerations are healing as they are  essential ischial ulcerations despite being on her posterior thighs (a result of her positioning in chair due to body habitus).    Discharge Diagnoses:  Principal Problem:   Nausea with vomiting Active Problems:   Acute on chronic renal failure   HYPERTENSION   Diabetes mellitus, type 2   Morbid obesity   Chronic diastolic heart failure    Discharge Condition: Medically stable for discharge to skilled nursing facility today  Diet recommendation: As tolerated  History of present illness:  67 year old female with past medical history of DM, HTN, Lymphedema, CHF and Depression who presented to [facility] ED with nausea, vomiting and diarrhea. In addition, pt reported poor oral intake.  While in the ED patient was found to have an elevated creatinine level above baseline 2.9 compared to 1.66). Symptoms controlled with IV fluids and antiemetics.   Assessment/Plan:  Principal Problem:  Nausea with vomiting, Diarrhea  - likely viral gastroenteritis  - all symptoms resolved  - continue regular diet  - Abdominal x-ray done 05/04/2013 with no acute intra-abdominal findings - Patient had 2 bowel movements yesterday  Active Problems:  Acute on chronic renal failure  - baseline creatinine around 1.66 and on this admission 2.92  - likely due to prerenal etiology, dehydration or losartan and diuretics which were held on the admission  - creatinine now 1.86, trended down with IV fluids  - - continue to hold cozaar and may restart diuretic on discharge with the instruction to continue to monitor renal function regularly per SNF protocl Anemia of chronic disease  - secondary to chronic kidney disease  -  hemoglobin ranges 9.3 to 10.1; stable  - no indications for transfusion  HYPERTENSION  - continue Imdur, hydralazine and metoprolol  - cozaar on hold due ot renal failure Diabetes mellitus, 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  - last A1c in 09/2012 - 6.0 indicating good glycemic  control  Diabetic neuropathy  - continue gabapentin  Morbid obesity  - nutrition consulted  Chronic diastolic heart failure  - last 2 D ECHO in 2013 with EF 60%  - BNP around 600 in 10/2012   Code Status: full code  Family Communication: no family at the bedside   Manson Passey, MD  [facility]  Pager [phone]   Consultants:  Wound care consult Procedures:  None  Antibiotics:  None   Signed:  Manson Passey, MD  [facility] 05/05/2013, 10:53 AM  Pager #: [phone]   Discharge Exam: Filed Vitals:   05/05/13 0949  BP: 149/51  Pulse: 73  Temp:   Resp:    Filed Vitals:   05/04/13 0506 05/04/13 2121 05/05/13 0511 05/05/13 0949  BP: 148/52 151/50 164/78 149/51  Pulse: 67 63 79 73  Temp: 98.7 F (37.1 C) 97.9 F (36.6 C) 98 F (36.7 C)   TempSrc: Oral Oral Oral   Resp: 16 16 16    Height:      Weight:      SpO2: 100% 100% 93%     General: Pt is alert, follows commands appropriately, not in acute distress Cardiovascular: Regular rate and rhythm, S1/S2 appreciated Respiratory: Clear to auscultation bilaterally, no wheezing, no crackles, no rhonchi Abdominal: Soft, non tender, non distended, bowel sounds +, no guarding Extremities: LE edema, no cyanosis, pulses palpable bilaterally DP and PT Neuro: Grossly nonfocal  Discharge Instructions  Discharge Orders   Future Orders Complete By Expires   Call MD for:  difficulty breathing, headache or visual disturbances  As directed    Call MD for:  persistant dizziness or light-headedness  As directed    Call MD for:  persistant nausea and vomiting  As directed    Call MD for:  severe uncontrolled pain  As directed    Diet - low sodium heart healthy  As directed    Discharge instructions  As directed    Comments:     1. please note that you likely had viral gastroenteritis. He presented with nausea, vomiting and diarrhea which now has resolved. You have residual burning sensation in the abdomen/as also  closed which could be because of gastritis or acid reflux. You can continue taking sucralfate 1 g 3 times a day for about one month. If no significant improvement and you may stop this medication. You may also continue Nexium. 2. wound care for ulcers - please check dressing/wound care section for instructions 3. recommendation is to stop Cozaar as he may have contributed to worsening kidney function. Creatinine is 1.86 at the time of discharge. Please recheck kidney function in about 3-4 days after the discharge to make sure it is stable. 4. you may continue taking diuretic for lower extremity swelling. Please continue to monitor kidney function as indicated to make sure kidney function remained stable 5. hemoglobin is 9.3 prior to discharge, stable. 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   Right, clean, pink, moist (granulating) Drainage scant serosanguinous on left, scant serous on right Periwound:intact with some maceration on both. Dressing procedure/placement/frequency: saline dressing to the defect on the left twice daily and a soft silicone dressing to the right twice weekly.  Patient would have an expedited outcome if she could limit her time OOB to chair while these ulcerations are healing as they are essential ischial ulcerations despite being on her posterior thighs (a result of her positioning in chair due to body habitus).   Increase activity slowly  As directed        Medication List    STOP taking these medications       losartan 100 MG tablet  Commonly known as:  COZAAR      TAKE these medications       allopurinol 100 MG tablet  Commonly known as:  ZYLOPRIM  Take 100  mg by mouth 2 (two) times daily.     ALPRAZolam 0.25 MG tablet  Commonly known as:  XANAX  Take 1 tablet (0.25 mg total) by mouth 2 (two) times daily as needed for anxiety.     aspirin 325 MG EC tablet  Take 325 mg by mouth daily.     atorvastatin 40 MG tablet  Commonly known as:  LIPITOR  Take 40 mg by mouth at bedtime.     colchicine 0.6 MG tablet  Take 0.6 mg by mouth 2 (two) times daily.     esomeprazole 40 MG capsule  Commonly known as:  NEXIUM  Take 40 mg by mouth daily before breakfast.     gabapentin 300 MG capsule  Commonly known as:  NEURONTIN  Take 300 mg by mouth 3 (three) times daily.     hydrALAZINE 50 MG tablet  Commonly known as:  APRESOLINE  Take 1 tablet (50 mg total) by mouth 3 (three) times daily.     isosorbide mononitrate 10 MG tablet  Commonly known as:  ISMO,MONOKET  Take 10 mg by mouth 2 (two) times daily.     magnesium oxide 400 MG tablet  Commonly known as:  MAG-OX  Take 400 mg by mouth 2 (two) times daily.     metolazone 5 MG tablet  Commonly known as:  ZAROXOLYN  Take 5 mg by mouth every morning. Prior to morning torsemide     metoprolol tartrate 25 MG tablet  Commonly known as:  LOPRESSOR  Take 12.5 mg by mouth 2 (two) times daily.     oxyCODONE 5 MG immediate release tablet  Commonly known as:  Oxy IR/ROXICODONE  Take 4 tablets (20 mg total) by mouth 2 (two) times daily. For pain     OxyCODONE 10 mg T12a 12 hr tablet  Commonly known as:  OXYCONTIN  Take 2-4 tablets (20-40 mg total) by mouth every 6 (six) hours as needed (pain).     sucralfate 1 G tablet  Commonly known as:  CARAFATE  Take 1 tablet (1 g total) by mouth 4 (four) times daily -  with meals and at bedtime.     torsemide 20 MG tablet  Commonly known as:  DEMADEX  Take 2 tablets (40 mg total) by mouth 2 (two) times daily.     Vitamin D (Ergocalciferol) 50000 UNITS Caps capsule  Commonly known as:  DRISDOL  Take 50,000 Units by mouth every Thursday.     zolpidem  5  MG tablet  Commonly known as:  AMBIEN  Take 5 mg by mouth at bedtime as needed. For sleep           Follow-up Information   Follow up with Marletta Lor, NP In 2 weeks.   Specialty:  Nurse Practitioner   Contact information:   [facility] [address] [place] [address] [phone]        The results of significant diagnostics from this hospitalization (including imaging, microbiology, ancillary and laboratory) are listed below for reference.    Significant Diagnostic Studies: US Abdomen Complete  05/01/2013   CLINICAL DATA:  Right upper quadrant abdominal pain. Nausea and vomiting.  EXAM: ABDOMEN ULTRASOUND  COMPARISON:  08/14/2012  FINDINGS: Gallbladder  No gallstones or wall thickening. Negative sonographic Murphy's sign.  Common bile duct  Diameter: 5 mm  Liver  No focal lesion identified. Poor sonic penetration with heterogeneous echotexture. Poorly seen secondary to body habitus.  IVC  No abnormality visualized. Poorly seen secondary to body habitus.  Pancreas  Visualized portion unremarkable. Poorly seen secondary to body habitus.  Spleen  Size and appearance within normal limits. Poorly seen secondary to body habitus.  Right Kidney  Length: 10.8 cm Echogenicity within normal limits. No mass or hydronephrosis visualized. Poorly seen secondary to body habitus.  Left Kidney  Length: 11.5 cm Echogenicity within normal limits. No mass or hydronephrosis visualized. Poorly seen secondary to body habitus.  Abdominal aorta  No aneurysm visualized. Iliac vessels obscured by overlying bowel gas. Poorly seen secondary to body habitus.  IMPRESSION: 1. Reduced sensitivity due to body habitus causing poor visualization of various structures. Possible hepatic steatosis. Otherwise no definite abnormality observed.   Electronically Signed   By: Herbie Baltimore   On: 05/01/2013 18:59   Dg Abd Portable 1v  05/04/2013   CLINICAL DATA:  67 year old female with abdominal  pain and distension.  EXAM: PORTABLE ABDOMEN - 1 VIEW  COMPARISON:  CT Abdomen and Pelvis 08/14/2012.  FINDINGS: Portable AP supine views of the abdomen and pelvis. Non obstructed bowel gas pattern. Grossly negative lung bases. Abdominal and pelvic visceral contours are within normal limits. Right flank calcified injection granulomas re- identified. No acute osseous abnormality identified.  IMPRESSION: Non obstructed bowel gas pattern.   Electronically Signed   By: Augusto Gamble M.D.   On: 05/04/2013 12:53    Microbiology: Recent Results (from the past 240 hour(s))  MRSA PCR SCREENING     Status: Abnormal   Collection Time    05/02/13 12:23 AM      Result Value Range Status   MRSA by PCR POSITIVE (*) NEGATIVE Final   Comment:            The GeneXpert MRSA Assay (FDA     approved for NASAL specimens     only), is one component of a     comprehensive MRSA colonization     surveillance program. It is not     intended to diagnose MRSA     infection nor to guide or     monitor treatment for     MRSA infections.     RESULT CALLED TO, READ BACK BY AND VERIFIED WITH:     J DODSON AT 0147 ON 09.06.2014 BY NBROOKS  STOOL CULTURE     Status: None   Collection Time    05/02/13  3:26 PM      Result Value Range Status   Specimen Description  STOOL   Final   Special Requests NONE   Final   Culture     Final   Value: NO SUSPICIOUS COLONIES, CONTINUING TO HOLD     Performed at [facility]   Report Status PENDING   Incomplete  CLOSTRIDIUM DIFFICILE BY PCR     Status: None   Collection Time    05/02/13  3:26 PM      Result Value Range Status   C difficile by pcr NEGATIVE  NEGATIVE Final   Comment: Performed at [facility]     Labs: Basic Metabolic Panel:  Recent Labs Lab 05/01/13 1900 05/02/13 0445 05/03/13 0804  NA 138 139 144  K 4.1 3.7 4.2  CL 101 104 111  CO2 26 29 26   GLUCOSE 111* 121* 103*  BUN 84* 75* 48*  CREATININE 2.92* 2.65* 1.86*  CALCIUM 8.5 8.4 8.2*  MG   --  1.7  --   PHOS  --  4.4  --    Liver Function Tests:  Recent Labs Lab 05/01/13 1900  AST 57*  ALT 60*  ALKPHOS 158*  BILITOT 0.4  PROT 7.1  ALBUMIN 2.6*    Recent Labs Lab 05/01/13 1900  LIPASE 13   No results found for this basename: AMMONIA,  in the last 168 hours CBC:  Recent Labs Lab 05/01/13 1900 05/02/13 0445 05/03/13 0804  WBC 5.4 5.0 4.9  NEUTROABS 3.6  --   --   HGB 10.1* 9.1* 9.3*  HCT 31.1* 27.6* 29.1*  MCV 85.9 87.3 87.4  PLT 181 155 166   Cardiac Enzymes: No results found for this basename: CKTOTAL, CKMB, CKMBINDEX, TROPONINI,  in the last 168 hours BNP: BNP (last 3 results)  Recent Labs  11/17/12 1739  PROBNP 565.7*   CBG:  Recent Labs Lab 05/03/13 2220 05/04/13 0726 05/04/13 1249 05/04/13 1733 05/05/13 0755  GLUCAP 162* 79 126* 109* 97    Time coordinating discharge: Over 30 minutes

## 2013-05-05 NOTE — Progress Notes (Signed)
Patient d/c to [facility] via ambulance. Stable on d/c,denies pain.Hulda Marin RN

## 2013-05-05 NOTE — ED Provider Notes (Signed)
Shared service with midlevel provider. I have personally seen and examined the patient, providing direct face to face care, presenting with the chief complaint of nausea, emesis, weakness. Physical exam findings include pitting edema, legs and some abd tenderness. Pt has acute on chronic renal failure. Plan will be to admit. I have reviewed the nursing documentation on past medical history, family history, and social history.   Derwood Kaplan, MD 05/05/13 2024

## 2013-05-05 NOTE — Progress Notes (Signed)
CSW met with pt at bedside to discuss disposition plan.  CSW discussed that [facility] was unable to offer a bed.  Pt is agreeable to going to [facility].  Per MD, pt medically ready for discharge today.  CSW spoke with [facility] and confirmed that facility could accept today.  CSW to facilitate pt discharge needs when discharge paperwork available.  Jacklynn Lewis, MSW, LCSWA  Clinical Social Work [phone]

## 2013-05-05 NOTE — Progress Notes (Signed)
Report given to Meriam Sprague rn at [facility] healthcare.Hulda Marin RN

## 2013-05-06 LAB — STOOL CULTURE

## 2013-07-07 ENCOUNTER — Other Ambulatory Visit: Payer: Self-pay

## 2013-07-07 MED ORDER — TORSEMIDE 20 MG PO TABS: 40.0000 mg | ORAL_TABLET | Freq: Two times a day (BID) | ORAL | Status: DC

## 2013-08-14 ENCOUNTER — Other Ambulatory Visit: Payer: Self-pay | Admitting: *Deleted

## 2013-08-14 DIAGNOSIS — R609 Edema, unspecified: Secondary | ICD-10-CM

## 2013-08-14 DIAGNOSIS — L97909 Non-pressure chronic ulcer of unspecified part of unspecified lower leg with unspecified severity: Secondary | ICD-10-CM

## 2013-08-17 ENCOUNTER — Ambulatory Visit (INDEPENDENT_AMBULATORY_CARE_PROVIDER_SITE_OTHER): Payer: Medicare Other | Admitting: General Surgery

## 2013-09-02 ENCOUNTER — Other Ambulatory Visit: Payer: Self-pay | Admitting: Orthopedic Surgery

## 2013-09-02 DIAGNOSIS — M25512 Pain in left shoulder: Secondary | ICD-10-CM

## 2013-09-11 ENCOUNTER — Ambulatory Visit
Admission: RE | Admit: 2013-09-11 | Discharge: 2013-09-11 | Disposition: A | Discharge status: Home / Self Care | Payer: Medicare Other | Source: Ambulatory Visit | Attending: Orthopedic Surgery | Admitting: Orthopedic Surgery

## 2013-09-11 DIAGNOSIS — M25512 Pain in left shoulder: Secondary | ICD-10-CM

## 2013-09-17 ENCOUNTER — Encounter: Payer: Self-pay | Admitting: Vascular Surgery

## 2013-09-18 ENCOUNTER — Encounter: Payer: Self-pay | Admitting: Vascular Surgery

## 2013-09-18 ENCOUNTER — Ambulatory Visit (INDEPENDENT_AMBULATORY_CARE_PROVIDER_SITE_OTHER): Payer: Medicare Other | Admitting: Vascular Surgery

## 2013-09-18 ENCOUNTER — Ambulatory Visit (HOSPITAL_COMMUNITY)
Admission: RE | Admit: 2013-09-18 | Discharge: 2013-09-18 | Disposition: A | Discharge status: Home / Self Care | Payer: Medicare Other | Source: Ambulatory Visit | Attending: Vascular Surgery | Admitting: Vascular Surgery

## 2013-09-18 VITALS — BP 153/58 | HR 78 | Resp 16 | Ht 65.0 in | Wt 334.0 lb

## 2013-09-18 DIAGNOSIS — I872 Venous insufficiency (chronic) (peripheral): Secondary | ICD-10-CM | POA: Insufficient documentation

## 2013-09-18 DIAGNOSIS — R609 Edema, unspecified: Secondary | ICD-10-CM | POA: Insufficient documentation

## 2013-09-18 DIAGNOSIS — L97909 Non-pressure chronic ulcer of unspecified part of unspecified lower leg with unspecified severity: Secondary | ICD-10-CM

## 2013-09-18 DIAGNOSIS — I890 Lymphedema, not elsewhere classified: Secondary | ICD-10-CM | POA: Insufficient documentation

## 2013-09-18 NOTE — Progress Notes (Signed)
Referred by:  Marletta LorJulie Barr, NP [facility] [address] [place], [place] [id number]  Reason for referral: Swollen bilateral leg  History of Present Illness  Kelly BushyMargo H [name] is a 68 y.o. ([birth year]) female with morbid obesity who presents with chief complaint: bilateral swollen leg.  Patient notes, onset of "swelling" in legs for years, associated with no obvious trigger.  She has been told in the past she had lymphedema.  She has been managed in lymphedema clinic with compression unsuccessfully due to inability to tolerate compression.  By report [facility] and some sort of resection was offered to the patient.  The patient has had no history of DVT, no history of pregnancy, known history of varicose vein, no history of venous stasis ulcers, known history of  Lymphedema and known history of skin changes in lower legs.  There is no family history of venous disorders.  The patient has never used compression stockings in the past.  Past Medical History  Diagnosis Date  . Hypertension   . Diabetes mellitus   . Hyperlipidemia   . CHF (congestive heart failure)   . GERD (gastroesophageal reflux disease)   . Depression   . Sleep apnea     doesn't use cpap machine or 02 at night  . Headache(784.0)     Migraines  . Arthritis     knees  . Anemia     Had blood transfusion x 2 in yhe 90s  . Lymphedema of leg     Past Surgical History  Procedure Laterality Date  . Rotator cuff repair      right  . Knee arthroscopy      right  . Temple biopsy    . Colonoscopy with propofol  09/23/2012    Procedure: COLONOSCOPY WITH PROPOFOL;  Surgeon: Beverley FiedlerJay M Pyrtle, MD;  Location: [facility];  Service: Gastroenterology;  Laterality: N/A;    History   Social History  . Marital Status: Legally Separated    Spouse Name: N/A    Number of Children: 0  . Years of Education: N/A   Occupational History  .     Social History Main Topics  .  Smoking status: Former Smoker    Types: Cigarettes    Quit date: 08/27/1981  . Smokeless tobacco: Never Used  . Alcohol Use: No  . Drug Use: No  . Sexual Activity: No   Other Topics Concern  . Not on file   Social History Narrative  . No narrative on file    Family History  Problem Relation Age of Onset  . Emphysema Father   . Asthma Brother   . Heart disease Mother   . Stomach cancer Brother   . Heart disease Maternal Grandmother   . Diabetes Mother   . Diabetes Sister    Current Outpatient Prescriptions on File Prior to Visit  Medication Sig Dispense Refill  . allopurinol (ZYLOPRIM) 100 MG tablet Take 100 mg by mouth 2 (two) times daily.      Marland Kitchen. ALPRAZolam (XANAX) 0.25 MG tablet Take 1 tablet (0.25 mg total) by mouth 2 (two) times daily as needed for anxiety.  45 tablet  0  . aspirin 325 MG EC tablet Take 325 mg by mouth daily.      Marland Kitchen. atorvastatin (LIPITOR) 40 MG tablet Take 40 mg by mouth at bedtime.      . colchicine 0.6 MG tablet Take 0.6 mg by mouth  2 (two) times daily.      Marland Kitchen esomeprazole (NEXIUM) 40 MG capsule Take 40 mg by mouth daily before breakfast.      . gabapentin (NEURONTIN) 300 MG capsule Take 300 mg by mouth 3 (three) times daily.      . hydrALAZINE (APRESOLINE) 50 MG tablet Take 1 tablet (50 mg total) by mouth 3 (three) times daily.  90 tablet  0  . isosorbide mononitrate (ISMO,MONOKET) 10 MG tablet Take 10 mg by mouth 2 (two) times daily.      . metolazone (ZAROXOLYN) 5 MG tablet Take 5 mg by mouth every morning. Prior to morning torsemide      . metoprolol tartrate (LOPRESSOR) 25 MG tablet Take 12.5 mg by mouth 2 (two) times daily.      . OxyCODONE (OXYCONTIN) 10 mg T12A 12 hr tablet Take 2-4 tablets (20-40 mg total) by mouth every 6 (six) hours as needed (pain).  60 tablet  0  . torsemide (DEMADEX) 20 MG tablet Take 2 tablets (40 mg total) by mouth 2 (two) times daily.  120 tablet  5  . Vitamin D, Ergocalciferol, (DRISDOL) 50000 UNITS CAPS Take 50,000  Units by mouth every Thursday.      . magnesium oxide (MAG-OX) 400 MG tablet Take 400 mg by mouth 2 (two) times daily.      Marland Kitchen oxyCODONE (OXY IR/ROXICODONE) 5 MG immediate release tablet Take 4 tablets (20 mg total) by mouth 2 (two) times daily. For pain  45 tablet  0  . sucralfate (CARAFATE) 1 G tablet Take 1 tablet (1 g total) by mouth 4 (four) times daily -  with meals and at bedtime.  90 tablet  0   No current facility-administered medications on file prior to visit.    Allergies  Allergen Reactions  . Penicillins Hives     REVIEW OF SYSTEMS:  (Positives checked otherwise negative)  CARDIOVASCULAR:  []  chest pain, []  chest pressure, []  palpitations, []  shortness of breath when laying flat, []  shortness of breath with exertion,  [x]  pain in feet when walking, [x]  pain in feet when laying flat, []  history of blood clot in veins (DVT), []  history of phlebitis, [x]  swelling in legs, []  varicose veins  PULMONARY:  []  productive cough, []  asthma, []  wheezing  NEUROLOGIC:  [x]  weakness in arms or legs, [x]  numbness in arms or legs, []  difficulty speaking or slurred speech, [x]  temporary loss of vision in one eye, []  dizziness  HEMATOLOGIC:  []  bleeding problems, []  problems with blood clotting too easily  MUSCULOSKEL:  [x]  joint pain, [x]  joint swelling  GASTROINTEST:  []  vomiting blood, []  blood in stool     GENITOURINARY:  []  burning with urination, []  blood in urine  PSYCHIATRIC:  []  history of major depression  INTEGUMENTARY:  []  rashes, []  ulcers  CONSTITUTIONAL:  []  fever, []  chills   Physical Examination Filed Vitals:   09/18/13 1306  BP: 153/58  Pulse: 78  Resp: 16  Height: 5\' 5"  (1.651 m)  Weight: 334 lb (151.501 kg)   Body mass index is 55.58 kg/(m^2).  General: A&O x 3, WD, morbidly obese  Head: [place]/AT  Ear/Nose/Throat: Hearing grossly intact, nares w/o erythema or drainage, oropharynx w/o Erythema/Exudate  Eyes: PERRLA, EOMI  Neck: Supple, no nuchal  rigidity, no palpable LAD  Pulmonary: Sym exp, good air movt, CTAB, no rales, rhonchi, & wheezing  Cardiac: RRR, Nl S1, S2, no Murmurs, rubs or gallops  Vascular: Vessel Right Left  Radial Palpable  Palpable  Brachial Palpable Palpable  Carotid Palpable, without bruit Palpable, without bruit  Aorta Not palpable due to obesity N/A  Femoral Not Palpable due to pannus Not Palpable due to pannus  Popliteal Not palpable Not palpable  PT FaintlyPalpable Faintly Palpable  DP Palpable Palpable   Gastrointestinal: soft, NTND, -G/R, - HSM, - masses, - CVAT B, large pannus  Musculoskeletal: M/S 5/5 throughout , Extremities without ischemic changes , chronic venous stasis skin changes in pendulous areas of B thigh, lipedema fat distribution, negative Karposi Stemmer sign  Neurologic: CN 2-12 grossly intact, Pain and light touch intact in extremities , Motor exam as listed above  Psychiatric: Judgment intact, Mood & affect appropriate for pt's clinical situation  Dermatologic: See M/S exam for extremity exam, no rashes otherwise noted  Lymph : No Cervical, Axillary, or Inguinal lymphadenopathy   Non-Invasive Vascular Imaging  BLE Venous Duplex (Date: 09/18/2013):   RLE: no DVT and SVT  LLE: + DVT and SVT  BLE Venous Insufficiency Duplex (Date: 09/18/2013):   RLE: no DVT and SVT, + GSV reflux, + deep venous reflux  LLE: no DVT and SVT, + GSV reflux, + deep venous reflux  Outside Studies/Documentation 4 pages of outside documents were reviewed including: outside ortho clinic.  Medical Decision Making  GLORYA BARTLEY is a 68 y.o. 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   Pt's exam is more consistent with lipedema in my opinion, though there is overlap between the two.    Regardless, I doubt this patient can be easily wrapped for compression.  I doubt there is a compression stocking capable of addressing her body  habitus.  None of the vascular surgeons in this practice have any experience with the Juanda Crumble' procedure and I'm not aware of any plastic surgeons in town that have such experience.  Subsequently, further work-up in a tertiary care facility would probably be more fruitful.    I would defer to [facility] the consideration for lymphoscintigraphy for definitive diagnosis and subsequent management.  The patient has evidence of chronic venous insufficiency which is also likely resulting in some degree of edema on her legs.  Again, management of such requires some degree of compression which would be difficult in her case.  Thank you for allowing [place] to participate in this patient's care.  Adele Barthel, MD [facility] Office: [phone] Pager: [phone]  09/18/2013, 1:38 PM

## 2014-01-25 DIAGNOSIS — I82409 Acute embolism and thrombosis of unspecified deep veins of unspecified lower extremity: Secondary | ICD-10-CM

## 2014-01-25 HISTORY — DX: Acute embolism and thrombosis of unspecified deep veins of unspecified lower extremity: I82.409

## 2014-02-08 HISTORY — PX: PANNICULECTOMY: SUR1001

## 2014-02-18 DIAGNOSIS — I2699 Other pulmonary embolism without acute cor pulmonale: Secondary | ICD-10-CM

## 2014-02-18 HISTORY — DX: Other pulmonary embolism without acute cor pulmonale: I26.99

## 2014-02-19 ENCOUNTER — Other Ambulatory Visit: Payer: Self-pay | Admitting: *Deleted

## 2014-02-19 ENCOUNTER — Non-Acute Institutional Stay (SKILLED_NURSING_FACILITY): Payer: Medicare Other | Admitting: Internal Medicine

## 2014-02-19 DIAGNOSIS — E0843 Diabetes mellitus due to underlying condition with diabetic autonomic (poly)neuropathy: Secondary | ICD-10-CM

## 2014-02-19 DIAGNOSIS — I5032 Chronic diastolic (congestive) heart failure: Secondary | ICD-10-CM

## 2014-02-19 DIAGNOSIS — E1349 Other specified diabetes mellitus with other diabetic neurological complication: Secondary | ICD-10-CM

## 2014-02-19 DIAGNOSIS — I10 Essential (primary) hypertension: Secondary | ICD-10-CM

## 2014-02-19 DIAGNOSIS — E1149 Type 2 diabetes mellitus with other diabetic neurological complication: Secondary | ICD-10-CM

## 2014-02-19 DIAGNOSIS — G909 Disorder of the autonomic nervous system, unspecified: Secondary | ICD-10-CM

## 2014-02-19 MED ORDER — OXYCODONE HCL 5 MG PO TABS: ORAL_TABLET | ORAL | Status: DC

## 2014-02-19 NOTE — Telephone Encounter (Signed)
[facility] 

## 2014-02-20 ENCOUNTER — Emergency Department (HOSPITAL_COMMUNITY): Payer: Medicare Other

## 2014-02-20 ENCOUNTER — Inpatient Hospital Stay (HOSPITAL_COMMUNITY)
Admission: EM | Admit: 2014-02-20 | Discharge: 2014-02-26 | DRG: 853 | Disposition: A | Discharge status: Skilled Nursing Facility | Payer: Medicare Other | Attending: Internal Medicine | Admitting: Internal Medicine

## 2014-02-20 ENCOUNTER — Encounter (HOSPITAL_COMMUNITY): Payer: Self-pay | Admitting: Emergency Medicine

## 2014-02-20 DIAGNOSIS — I872 Venous insufficiency (chronic) (peripheral): Secondary | ICD-10-CM

## 2014-02-20 DIAGNOSIS — E1142 Type 2 diabetes mellitus with diabetic polyneuropathy: Secondary | ICD-10-CM | POA: Diagnosis present

## 2014-02-20 DIAGNOSIS — I509 Heart failure, unspecified: Secondary | ICD-10-CM | POA: Diagnosis present

## 2014-02-20 DIAGNOSIS — Z8249 Family history of ischemic heart disease and other diseases of the circulatory system: Secondary | ICD-10-CM

## 2014-02-20 DIAGNOSIS — S91002A Unspecified open wound, left ankle, initial encounter: Secondary | ICD-10-CM

## 2014-02-20 DIAGNOSIS — R609 Edema, unspecified: Secondary | ICD-10-CM

## 2014-02-20 DIAGNOSIS — S81802A Unspecified open wound, left lower leg, initial encounter: Secondary | ICD-10-CM

## 2014-02-20 DIAGNOSIS — A419 Sepsis, unspecified organism: Principal | ICD-10-CM | POA: Diagnosis present

## 2014-02-20 DIAGNOSIS — Z7982 Long term (current) use of aspirin: Secondary | ICD-10-CM

## 2014-02-20 DIAGNOSIS — Z833 Family history of diabetes mellitus: Secondary | ICD-10-CM

## 2014-02-20 DIAGNOSIS — I10 Essential (primary) hypertension: Secondary | ICD-10-CM | POA: Diagnosis present

## 2014-02-20 DIAGNOSIS — D62 Acute posthemorrhagic anemia: Secondary | ICD-10-CM | POA: Diagnosis present

## 2014-02-20 DIAGNOSIS — N183 Chronic kidney disease, stage 3 unspecified: Secondary | ICD-10-CM | POA: Diagnosis present

## 2014-02-20 DIAGNOSIS — S91009D Unspecified open wound, unspecified ankle, subsequent encounter: Secondary | ICD-10-CM

## 2014-02-20 DIAGNOSIS — D631 Anemia in chronic kidney disease: Secondary | ICD-10-CM | POA: Diagnosis present

## 2014-02-20 DIAGNOSIS — Z800 Family history of malignant neoplasm of digestive organs: Secondary | ICD-10-CM

## 2014-02-20 DIAGNOSIS — I498 Other specified cardiac arrhythmias: Secondary | ICD-10-CM | POA: Diagnosis present

## 2014-02-20 DIAGNOSIS — M109 Gout, unspecified: Secondary | ICD-10-CM | POA: Diagnosis present

## 2014-02-20 DIAGNOSIS — I2699 Other pulmonary embolism without acute cor pulmonale: Secondary | ICD-10-CM | POA: Diagnosis present

## 2014-02-20 DIAGNOSIS — F3289 Other specified depressive episodes: Secondary | ICD-10-CM | POA: Diagnosis present

## 2014-02-20 DIAGNOSIS — L97909 Non-pressure chronic ulcer of unspecified part of unspecified lower leg with unspecified severity: Secondary | ICD-10-CM

## 2014-02-20 DIAGNOSIS — Z87891 Personal history of nicotine dependence: Secondary | ICD-10-CM

## 2014-02-20 DIAGNOSIS — E785 Hyperlipidemia, unspecified: Secondary | ICD-10-CM | POA: Diagnosis present

## 2014-02-20 DIAGNOSIS — K219 Gastro-esophageal reflux disease without esophagitis: Secondary | ICD-10-CM | POA: Diagnosis present

## 2014-02-20 DIAGNOSIS — I129 Hypertensive chronic kidney disease with stage 1 through stage 4 chronic kidney disease, or unspecified chronic kidney disease: Secondary | ICD-10-CM | POA: Diagnosis present

## 2014-02-20 DIAGNOSIS — S81009D Unspecified open wound, unspecified knee, subsequent encounter: Secondary | ICD-10-CM

## 2014-02-20 DIAGNOSIS — S81009A Unspecified open wound, unspecified knee, initial encounter: Secondary | ICD-10-CM

## 2014-02-20 DIAGNOSIS — I5032 Chronic diastolic (congestive) heart failure: Secondary | ICD-10-CM | POA: Diagnosis present

## 2014-02-20 DIAGNOSIS — F329 Major depressive disorder, single episode, unspecified: Secondary | ICD-10-CM | POA: Diagnosis present

## 2014-02-20 DIAGNOSIS — S81809D Unspecified open wound, unspecified lower leg, subsequent encounter: Secondary | ICD-10-CM

## 2014-02-20 DIAGNOSIS — N039 Chronic nephritic syndrome with unspecified morphologic changes: Secondary | ICD-10-CM

## 2014-02-20 DIAGNOSIS — I959 Hypotension, unspecified: Secondary | ICD-10-CM

## 2014-02-20 DIAGNOSIS — N12 Tubulo-interstitial nephritis, not specified as acute or chronic: Secondary | ICD-10-CM | POA: Diagnosis present

## 2014-02-20 DIAGNOSIS — Z8614 Personal history of Methicillin resistant Staphylococcus aureus infection: Secondary | ICD-10-CM

## 2014-02-20 DIAGNOSIS — T83511A Infection and inflammatory reaction due to indwelling urethral catheter, initial encounter: Secondary | ICD-10-CM | POA: Diagnosis present

## 2014-02-20 DIAGNOSIS — E1140 Type 2 diabetes mellitus with diabetic neuropathy, unspecified: Secondary | ICD-10-CM

## 2014-02-20 DIAGNOSIS — N179 Acute kidney failure, unspecified: Secondary | ICD-10-CM | POA: Diagnosis present

## 2014-02-20 DIAGNOSIS — Y846 Urinary catheterization as the cause of abnormal reaction of the patient, or of later complication, without mention of misadventure at the time of the procedure: Secondary | ICD-10-CM | POA: Diagnosis present

## 2014-02-20 DIAGNOSIS — E662 Morbid (severe) obesity with alveolar hypoventilation: Secondary | ICD-10-CM | POA: Diagnosis present

## 2014-02-20 DIAGNOSIS — D72829 Elevated white blood cell count, unspecified: Secondary | ICD-10-CM

## 2014-02-20 DIAGNOSIS — Z6841 Body Mass Index (BMI) 40.0 and over, adult: Secondary | ICD-10-CM

## 2014-02-20 DIAGNOSIS — R000 Tachycardia, unspecified: Secondary | ICD-10-CM

## 2014-02-20 DIAGNOSIS — S81809A Unspecified open wound, unspecified lower leg, initial encounter: Secondary | ICD-10-CM

## 2014-02-20 DIAGNOSIS — R6520 Severe sepsis without septic shock: Secondary | ICD-10-CM

## 2014-02-20 DIAGNOSIS — Z9889 Other specified postprocedural states: Secondary | ICD-10-CM

## 2014-02-20 DIAGNOSIS — G4730 Sleep apnea, unspecified: Secondary | ICD-10-CM | POA: Diagnosis present

## 2014-02-20 DIAGNOSIS — N189 Chronic kidney disease, unspecified: Secondary | ICD-10-CM

## 2014-02-20 DIAGNOSIS — N390 Urinary tract infection, site not specified: Secondary | ICD-10-CM

## 2014-02-20 DIAGNOSIS — S91009A Unspecified open wound, unspecified ankle, initial encounter: Secondary | ICD-10-CM

## 2014-02-20 DIAGNOSIS — S81002A Unspecified open wound, left knee, initial encounter: Secondary | ICD-10-CM

## 2014-02-20 DIAGNOSIS — Z79899 Other long term (current) drug therapy: Secondary | ICD-10-CM

## 2014-02-20 DIAGNOSIS — E1149 Type 2 diabetes mellitus with other diabetic neurological complication: Secondary | ICD-10-CM | POA: Diagnosis present

## 2014-02-20 HISTORY — DX: Presence of other specified devices: Z97.8

## 2014-02-20 HISTORY — DX: Presence of urogenital implants: Z96.0

## 2014-02-20 HISTORY — DX: Other pulmonary embolism without acute cor pulmonale: I26.99

## 2014-02-20 LAB — CBC WITH DIFFERENTIAL/PLATELET
Basophils Absolute: 0 10*3/uL (ref 0.0–0.1)
Basophils Relative: 0 % (ref 0–1)
Eosinophils Absolute: 0.1 10*3/uL (ref 0.0–0.7)
Eosinophils Relative: 1 % (ref 0–5)
HCT: 28 % — ABNORMAL LOW (ref 36.0–46.0)
Hemoglobin: 9 g/dL — ABNORMAL LOW (ref 12.0–15.0)
LYMPHS PCT: 4 % — AB (ref 12–46)
Lymphs Abs: 0.5 10*3/uL — ABNORMAL LOW (ref 0.7–4.0)
MCH: 28.8 pg (ref 26.0–34.0)
MCHC: 32.1 g/dL (ref 30.0–36.0)
MCV: 89.7 fL (ref 78.0–100.0)
Monocytes Absolute: 0.5 10*3/uL (ref 0.1–1.0)
Monocytes Relative: 4 % (ref 3–12)
NEUTROS ABS: 12.2 10*3/uL — AB (ref 1.7–7.7)
Neutrophils Relative %: 91 % — ABNORMAL HIGH (ref 43–77)
PLATELETS: 370 10*3/uL (ref 150–400)
RBC: 3.12 MIL/uL — ABNORMAL LOW (ref 3.87–5.11)
RDW: 16.6 % — ABNORMAL HIGH (ref 11.5–15.5)
WBC: 13.3 10*3/uL — AB (ref 4.0–10.5)

## 2014-02-20 LAB — I-STAT CG4 LACTIC ACID, ED: Lactic Acid, Venous: 1.3 mmol/L (ref 0.5–2.2)

## 2014-02-20 MED ORDER — SODIUM CHLORIDE 0.9 % IV BOLUS (SEPSIS)
1000.0000 mL | Freq: Once | INTRAVENOUS | Status: AC
  Administered 2014-02-21: 1000 mL via INTRAVENOUS

## 2014-02-20 MED ORDER — DEXTROSE 5 % IV SOLN
2.0000 g | Freq: Once | INTRAVENOUS | Status: AC
  Administered 2014-02-21: 2 g via INTRAVENOUS
  Filled 2014-02-20: qty 2

## 2014-02-20 MED ORDER — VANCOMYCIN HCL 10 G IV SOLR
1500.0000 mg | Freq: Once | INTRAVENOUS | Status: AC
  Administered 2014-02-21: 1500 mg via INTRAVENOUS
  Filled 2014-02-20: qty 1500

## 2014-02-20 NOTE — ED Provider Notes (Signed)
CSN: [id number]     Arrival date & time 02/20/14  2243 History   First MD Initiated Contact with Patient 02/20/14 2257     Chief Complaint  Patient presents with  . Fever     (Consider location/radiation/quality/duration/timing/severity/associated sxs/prior Treatment) HPI Comments: 68 year old female with diabetes, high blood pressure, acute renal failure, obesity, diastolic heart failure, lymphedema, recent discharge and nursing home placement from [facility] after lymphedema surgery and wound VAC placed left inner thigh presents with fever and general weakness for the past 24 hours. Patient started developing fevers soon after being discharged from the hospital is felt generally weak since. Patient denies respiratory or abdominal complaints except for nausea. Patient has wound VAC in place with continuous drainage. No new rashes appreciated. Patient has catheter in place.  Patient is a 68 y.o. female presenting with fever. The history is provided by the patient and medical records.  Fever Associated symptoms: nausea   Associated symptoms: no chest pain, no chills, no congestion, no dysuria, no headaches, no rash and no vomiting     Past Medical History  Diagnosis Date  . Hypertension   . Diabetes mellitus   . Hyperlipidemia   . CHF (congestive heart failure)   . GERD (gastroesophageal reflux disease)   . Depression   . Sleep apnea     doesn't use cpap machine or 02 at night  . Headache(784.0)     Migraines  . Arthritis     knees  . Anemia     Had blood transfusion x 2 in yhe 90s  . Lymphedema of leg    Past Surgical History  Procedure Laterality Date  . Rotator cuff repair      right  . Knee arthroscopy      right  . Temple biopsy    . Colonoscopy with propofol  09/23/2012    Procedure: COLONOSCOPY WITH PROPOFOL;  Surgeon: Jerene Bears, MD;  Location: [facility];  Service: Gastroenterology;  Laterality: N/A;   Family History  Problem Relation Age of Onset  . Emphysema  Father   . Asthma Brother   . Heart disease Mother   . Stomach cancer Brother   . Heart disease Maternal Grandmother   . Diabetes Mother   . Diabetes Sister    History  Substance Use Topics  . Smoking status: Former Smoker    Types: Cigarettes    Quit date: 08/27/1981  . Smokeless tobacco: Never Used  . Alcohol Use: No   OB History   Grav Para Term Preterm Abortions TAB SAB Ect Mult Living                 Review of Systems  Constitutional: Positive for fever and fatigue. Negative for chills.  HENT: Negative for congestion.   Eyes: Negative for visual disturbance.  Respiratory: Negative for shortness of breath.   Cardiovascular: Negative for chest pain.  Gastrointestinal: Positive for nausea. Negative for vomiting and abdominal pain.  Genitourinary: Negative for dysuria and flank pain.  Musculoskeletal: Negative for back pain, neck pain and neck stiffness.  Skin: Positive for wound. Negative for rash.  Neurological: Negative for light-headedness and headaches.      Allergies  Penicillins  Home Medications   Prior to Admission medications   Medication Sig Start Date End Date Taking? Authorizing Provider  allopurinol (ZYLOPRIM) 100 MG tablet Take 100 mg by mouth 2 (two) times daily.    Historical Provider, MD  ALPRAZolam Duanne Moron) 0.25 MG tablet Take 1 tablet (0.25 mg  total) by mouth 2 (two) times daily as needed for anxiety. 05/05/13   Robbie Lis, MD  aspirin 325 MG EC tablet Take 325 mg by mouth daily.    Historical Provider, MD  atorvastatin (LIPITOR) 40 MG tablet Take 40 mg by mouth at bedtime.    Historical Provider, MD  colchicine 0.6 MG tablet Take 0.6 mg by mouth 2 (two) times daily.    Historical Provider, MD  doxycycline (VIBRAMYCIN) 100 MG capsule  08/01/13   Historical Provider, MD  esomeprazole (NEXIUM) 40 MG capsule Take 40 mg by mouth daily before breakfast.    Historical Provider, MD  gabapentin (NEURONTIN) 300 MG capsule Take 300 mg by mouth 3 (three)  times daily.    Historical Provider, MD  hydrALAZINE (APRESOLINE) 25 MG tablet  09/02/13   Historical Provider, MD  hydrALAZINE (APRESOLINE) 50 MG tablet Take 1 tablet (50 mg total) by mouth 3 (three) times daily. 05/05/13   Robbie Lis, MD  isosorbide mononitrate (ISMO,MONOKET) 10 MG tablet Take 10 mg by mouth 2 (two) times daily.    Historical Provider, MD  losartan (COZAAR) 100 MG tablet Take 100 mg by mouth daily.    Historical Provider, MD  magnesium oxide (MAG-OX) 400 MG tablet Take 400 mg by mouth 2 (two) times daily.    Historical Provider, MD  metolazone (ZAROXOLYN) 5 MG tablet Take 5 mg by mouth every morning. Prior to morning torsemide    Historical Provider, MD  metoprolol tartrate (LOPRESSOR) 25 MG tablet Take 12.5 mg by mouth 2 (two) times daily.    Historical Provider, MD  oxyCODONE (OXY IR/ROXICODONE) 5 MG immediate release tablet Take one tablet by mouth every 4 hours as needed for pain 02/19/14   Tiffany L Reed, DO  OxyCODONE (OXYCONTIN) 10 mg T12A 12 hr tablet Take 2-4 tablets (20-40 mg total) by mouth every 6 (six) hours as needed (pain). 05/05/13   Robbie Lis, MD  Oxycodone HCl 10 MG TABS  09/04/13   Historical Provider, MD  OXYCONTIN 20 MG T12A 12 hr tablet  09/04/13   Historical Provider, MD  promethazine (PHENERGAN) 25 MG tablet Take 25 mg by mouth every 6 (six) hours as needed for nausea or vomiting.    Historical Provider, MD  senna-docusate (SENNA S) 8.6-50 MG per tablet Take 2 tablets by mouth daily.    Historical Provider, MD  sucralfate (CARAFATE) 1 G tablet Take 1 tablet (1 g total) by mouth 4 (four) times daily -  with meals and at bedtime. 05/05/13   Robbie Lis, MD  torsemide (DEMADEX) 20 MG tablet Take 2 tablets (40 mg total) by mouth 2 (two) times daily. 07/07/13   Thayer Headings, MD  Vitamin D, Ergocalciferol, (DRISDOL) 50000 UNITS CAPS Take 50,000 Units by mouth every Thursday.    Historical Provider, MD  zolpidem (AMBIEN) 5 MG tablet Take 5 mg by mouth at bedtime as  needed for sleep.    Historical Provider, MD   There were no vitals taken for this visit. Physical Exam  Nursing note and vitals reviewed. Constitutional: She is oriented to person, place, and time. She appears well-developed and well-nourished. No distress.  HENT:  Head: Normocephalic and atraumatic.  Dry mm  Eyes: Conjunctivae are normal. Right eye exhibits no discharge. Left eye exhibits no discharge.  Neck: Normal range of motion. Neck supple. No tracheal deviation present.  Cardiovascular: Regular rhythm.  Tachycardia present.   Pulmonary/Chest: Effort normal and breath sounds normal. No respiratory  distress. She has no rales (decre  breath sounds due to body habitus).  Abdominal: Soft. She exhibits no distension (obese). There is no tenderness. There is no guarding.  Musculoskeletal: She exhibits edema and tenderness.  Neurological: She is alert and oriented to person, place, and time.  Skin: Skin is warm. No rash noted.  Patient has 2+ edema bilateral lower extremity is with wound VAC in place left inner thigh with mild warmth and mild tenderness but no surrounding erythema or induration.  Psychiatric: She has a normal mood and affect.    ED Course  Procedures (including critical care time) Emergency Ultrasound Study:   Angiocath insertion Performed by: Mariea Clonts  Consent: Verbal consent obtained. Risks and benefits: risks, benefits and alternatives were discussed Immediately prior to procedure the correct patient, procedure, equipment, support staff and site/side marked as needed.  Indication: difficult IV access Preparation: Patient was prepped and draped in the usual sterile fashion. Vein Location: left ac vein was visualized during assessment for potential access sites and was found to be patent/ easily compressed with linear ultrasound.  The needle was visualized with real-time ultrasound and guided into the vein. Gauge: 18g  Image saved and stored.  Normal  blood return.  Patient tolerance: Patient tolerated the procedure well with no immediate complications.   Emergency Ultrasound Study:   Angiocath insertion Performed by: Mariea Clonts  Consent: Verbal consent obtained. Risks and benefits: risks, benefits and alternatives were discussed Immediately prior to procedure the correct patient, procedure, equipment, support staff and site/side marked as needed.  Indication: difficult IV access Preparation: Patient was prepped and draped in the usual sterile fashion. Vein Location: right ac vein was visualized during assessment for potential access sites and was found to be patent/ easily compressed with linear ultrasound.  The needle was visualized with real-time ultrasound and guided into the vein. Gauge: 18 g  Image saved and stored.  Normal blood return.  Patient tolerance: Patient tolerated the procedure well with no immediate complications.     Labs Review Labs Reviewed  CBC WITH DIFFERENTIAL - Abnormal; Notable for the following:    WBC 13.3 (*)    RBC 3.12 (*)    Hemoglobin 9.0 (*)    HCT 28.0 (*)    RDW 16.6 (*)    Neutrophils Relative % 91 (*)    Neutro Abs 12.2 (*)    Lymphocytes Relative 4 (*)    Lymphs Abs 0.5 (*)    All other components within normal limits  COMPREHENSIVE METABOLIC PANEL - Abnormal; Notable for the following:    Sodium 135 (*)    Glucose, Bld 174 (*)    BUN 24 (*)    Creatinine, Ser 1.42 (*)    Calcium 8.1 (*)    Albumin 1.9 (*)    Alkaline Phosphatase 153 (*)    Total Bilirubin 0.2 (*)    GFR calc non Af Amer 38 (*)    GFR calc Af Amer 44 (*)    All other components within normal limits  URINALYSIS, ROUTINE W REFLEX MICROSCOPIC - Abnormal; Notable for the following:    APPearance CLOUDY (*)    Hgb urine dipstick MODERATE (*)    Protein, ur 30 (*)    Leukocytes, UA SMALL (*)    All other components within normal limits  PROTIME-INR - Abnormal; Notable for the following:    Prothrombin  Time 16.4 (*)    All other components within normal limits  URINE MICROSCOPIC-ADD ON -  Abnormal; Notable for the following:    Bacteria, UA MANY (*)    All other components within normal limits  BASIC METABOLIC PANEL - Abnormal; Notable for the following:    Sodium 136 (*)    Potassium 5.5 (*)    Glucose, Bld 171 (*)    BUN 26 (*)    Creatinine, Ser 1.62 (*)    Calcium 8.0 (*)    GFR calc non Af Amer 32 (*)    GFR calc Af Amer 37 (*)    All other components within normal limits  CBC - Abnormal; Notable for the following:    WBC 17.7 (*)    RBC 2.87 (*)    Hemoglobin 8.3 (*)    HCT 25.9 (*)    RDW 16.5 (*)    All other components within normal limits  PROTIME-INR - Abnormal; Notable for the following:    Prothrombin Time 17.3 (*)    All other components within normal limits  GLUCOSE, CAPILLARY - Abnormal; Notable for the following:    Glucose-Capillary 166 (*)    All other components within normal limits  GLUCOSE, CAPILLARY - Abnormal; Notable for the following:    Glucose-Capillary 176 (*)    All other components within normal limits  CBC - Abnormal; Notable for the following:    WBC 13.8 (*)    RBC 2.75 (*)    Hemoglobin 8.2 (*)    HCT 24.1 (*)    RDW 15.8 (*)    All other components within normal limits  CBC - Abnormal; Notable for the following:    WBC 12.9 (*)    RBC 2.22 (*)    Hemoglobin 6.4 (*)    HCT 20.2 (*)    RDW 16.6 (*)    All other components within normal limits  BASIC METABOLIC PANEL - Abnormal; Notable for the following:    Sodium 134 (*)    CO2 18 (*)    Glucose, Bld 181 (*)    BUN 31 (*)    Creatinine, Ser 1.60 (*)    Calcium 7.6 (*)    GFR calc non Af Amer 33 (*)    GFR calc Af Amer 38 (*)    All other components within normal limits  HEMOGLOBIN A1C - Abnormal; Notable for the following:    Hemoglobin A1C 6.1 (*)    Mean Plasma Glucose 128 (*)    All other components within normal limits  GLUCOSE, CAPILLARY - Abnormal; Notable for the  following:    Glucose-Capillary 167 (*)    All other components within normal limits  PROTIME-INR - Abnormal; Notable for the following:    Prothrombin Time 18.8 (*)    INR 1.57 (*)    All other components within normal limits  GLUCOSE, CAPILLARY - Abnormal; Notable for the following:    Glucose-Capillary 211 (*)    All other components within normal limits  GLUCOSE, CAPILLARY - Abnormal; Notable for the following:    Glucose-Capillary 159 (*)    All other components within normal limits  CBC - Abnormal; Notable for the following:    WBC 10.7 (*)    RBC 2.27 (*)    Hemoglobin 6.4 (*)    HCT 19.5 (*)    RDW 16.1 (*)    All other components within normal limits  PROTIME-INR - Abnormal; Notable for the following:    Prothrombin Time 17.1 (*)    All other components within normal limits  GLUCOSE, CAPILLARY - Abnormal; Notable for  the following:    Glucose-Capillary 165 (*)    All other components within normal limits  GLUCOSE, CAPILLARY - Abnormal; Notable for the following:    Glucose-Capillary 137 (*)    All other components within normal limits  GLUCOSE, CAPILLARY - Abnormal; Notable for the following:    Glucose-Capillary 172 (*)    All other components within normal limits  CULTURE, BLOOD (ROUTINE X 2)  CULTURE, BLOOD (ROUTINE X 2)  URINE CULTURE  MRSA PCR SCREENING  BASIC METABOLIC PANEL  CBC  I-STAT CG4 LACTIC ACID, ED  PREPARE RBC (CROSSMATCH)  TYPE AND SCREEN  ABO/RH  PREPARE RBC (CROSSMATCH)    Imaging Review No results found.   EKG Interpretation   Date/Time:  Saturday February 20 2014 22:53:39 EDT Ventricular Rate:  124 PR Interval:  147 QRS Duration: 125 QT Interval:  327 QTC Calculation: 470 R Axis:   -108 Text Interpretation:  Sinus tachycardia RBBB and LAFB Since last tracing  rate faster Confirmed by [facility]  MD, ELLIOTT ([id number]) on 02/20/2014 10:58:22  PM      MDM   Final diagnoses:  Severe sepsis  Anemia of chronic renal failure, stage 3  (moderate)  Chronic diastolic heart failure  Chronic kidney disease, stage 3  UTI  Patient with recent discharge and multiple medical conditions presents with fever and tachycardia. Code sepsis called with blood work pending, chest x-ray, urinalysis and blood cultures. Brought antibiotics ordered. Patient has mild rash to penicillin. Fluid bolus ordered and discussed readmission for further assessment and monitoring. Patient is on Cipro at the nursing home. Sepsis work up and admission.  Discussed with triad, step down ordered.   The patients results and plan were reviewed and discussed.   Any x-rays performed were personally reviewed by myself.   Differential diagnosis were considered with the presenting HPI.  Medications  vancomycin (VANCOCIN) 1,500 mg in sodium chloride 0.9 % 500 mL IVPB (not administered)  sodium chloride 0.9 % bolus 1,000 mL (not administered)  ceFEPIme (MAXIPIME) 2 g in dextrose 5 % 50 mL IVPB (not administered)    .  There were no vitals filed for this visit.  Admission/ observation were discussed with the admitting physician, patient and/or family and they are comfortable with the plan.    Mariea Clonts, MD 02/23/14 [phone]

## 2014-02-20 NOTE — ED Notes (Signed)
Per EMS:the patient has had off and on fever x 3 days. The patient reports that she is hot to touch and the flushed. The patient is awake and alert. Recent history of surgery. Tylenol at 835 - 650mg  tylenol  At 935 it was 104 - at 100 103.5 that patient states that she feels dry and has a Headache.

## 2014-02-20 NOTE — ED Notes (Signed)
Bed: BB04 Expected date:  Expected time:  Means of arrival:  Comments: EMS from SNF with 103.8 temp/tylenol at 2130

## 2014-02-21 ENCOUNTER — Encounter (HOSPITAL_COMMUNITY): Payer: Self-pay | Admitting: Internal Medicine

## 2014-02-21 DIAGNOSIS — I5032 Chronic diastolic (congestive) heart failure: Secondary | ICD-10-CM

## 2014-02-21 DIAGNOSIS — R6520 Severe sepsis without septic shock: Secondary | ICD-10-CM | POA: Diagnosis present

## 2014-02-21 DIAGNOSIS — M109 Gout, unspecified: Secondary | ICD-10-CM

## 2014-02-21 DIAGNOSIS — D72829 Elevated white blood cell count, unspecified: Secondary | ICD-10-CM

## 2014-02-21 DIAGNOSIS — N390 Urinary tract infection, site not specified: Secondary | ICD-10-CM

## 2014-02-21 DIAGNOSIS — D631 Anemia in chronic kidney disease: Secondary | ICD-10-CM

## 2014-02-21 DIAGNOSIS — A419 Sepsis, unspecified organism: Secondary | ICD-10-CM | POA: Diagnosis present

## 2014-02-21 DIAGNOSIS — N183 Chronic kidney disease, stage 3 unspecified: Secondary | ICD-10-CM

## 2014-02-21 DIAGNOSIS — E1140 Type 2 diabetes mellitus with diabetic neuropathy, unspecified: Secondary | ICD-10-CM

## 2014-02-21 DIAGNOSIS — N039 Chronic nephritic syndrome with unspecified morphologic changes: Secondary | ICD-10-CM

## 2014-02-21 DIAGNOSIS — I2699 Other pulmonary embolism without acute cor pulmonale: Secondary | ICD-10-CM

## 2014-02-21 DIAGNOSIS — R000 Tachycardia, unspecified: Secondary | ICD-10-CM

## 2014-02-21 DIAGNOSIS — T83511A Infection and inflammatory reaction due to indwelling urethral catheter, initial encounter: Secondary | ICD-10-CM

## 2014-02-21 LAB — BASIC METABOLIC PANEL
BUN: 26 mg/dL — ABNORMAL HIGH (ref 6–23)
CO2: 19 meq/L (ref 19–32)
Calcium: 8 mg/dL — ABNORMAL LOW (ref 8.4–10.5)
Chloride: 104 mEq/L (ref 96–112)
Creatinine, Ser: 1.62 mg/dL — ABNORMAL HIGH (ref 0.50–1.10)
GFR calc non Af Amer: 32 mL/min — ABNORMAL LOW (ref >90)
GFR, EST AFRICAN AMERICAN: 37 mL/min — AB (ref >90)
Glucose, Bld: 171 mg/dL — ABNORMAL HIGH (ref 70–99)
POTASSIUM: 5.5 meq/L — AB (ref 3.7–5.3)
SODIUM: 136 meq/L — AB (ref 137–147)

## 2014-02-21 LAB — COMPREHENSIVE METABOLIC PANEL
ALT: 25 U/L (ref 0–35)
AST: 23 U/L (ref 0–37)
Albumin: 1.9 g/dL — ABNORMAL LOW (ref 3.5–5.2)
Alkaline Phosphatase: 153 U/L — ABNORMAL HIGH (ref 39–117)
BILIRUBIN TOTAL: 0.2 mg/dL — AB (ref 0.3–1.2)
BUN: 24 mg/dL — ABNORMAL HIGH (ref 6–23)
CALCIUM: 8.1 mg/dL — AB (ref 8.4–10.5)
CHLORIDE: 102 meq/L (ref 96–112)
CO2: 20 meq/L (ref 19–32)
Creatinine, Ser: 1.42 mg/dL — ABNORMAL HIGH (ref 0.50–1.10)
GFR calc Af Amer: 44 mL/min — ABNORMAL LOW (ref >90)
GFR, EST NON AFRICAN AMERICAN: 38 mL/min — AB (ref >90)
Glucose, Bld: 174 mg/dL — ABNORMAL HIGH (ref 70–99)
Potassium: 5.3 mEq/L (ref 3.7–5.3)
SODIUM: 135 meq/L — AB (ref 137–147)
Total Protein: 7 g/dL (ref 6.0–8.3)

## 2014-02-21 LAB — URINALYSIS, ROUTINE W REFLEX MICROSCOPIC
Bilirubin Urine: NEGATIVE
GLUCOSE, UA: NEGATIVE mg/dL
KETONES UR: NEGATIVE mg/dL
NITRITE: NEGATIVE
PH: 5.5 (ref 5.0–8.0)
Protein, ur: 30 mg/dL — AB
Specific Gravity, Urine: 1.014 (ref 1.005–1.030)
Urobilinogen, UA: 0.2 mg/dL (ref 0.0–1.0)

## 2014-02-21 LAB — PREPARE RBC (CROSSMATCH)

## 2014-02-21 LAB — CBC
HEMATOCRIT: 25.9 % — AB (ref 36.0–46.0)
HEMATOCRIT: 20.2 % — AB (ref 36.0–46.0)
Hemoglobin: 8.3 g/dL — ABNORMAL LOW (ref 12.0–15.0)
Hemoglobin: 6.4 g/dL — CL (ref 12.0–15.0)
MCH: 28.9 pg (ref 26.0–34.0)
MCH: 28.4 pg (ref 26.0–34.0)
MCHC: 32 g/dL (ref 30.0–36.0)
MCHC: 31.2 g/dL (ref 30.0–36.0)
MCV: 90.2 fL (ref 78.0–100.0)
MCV: 91 fL (ref 78.0–100.0)
Platelets: 328 10*3/uL (ref 150–400)
Platelets: 271 10*3/uL (ref 150–400)
RBC: 2.87 MIL/uL — AB (ref 3.87–5.11)
RBC: 2.22 MIL/uL — ABNORMAL LOW (ref 3.87–5.11)
RDW: 16.5 % — ABNORMAL HIGH (ref 11.5–15.5)
RDW: 16.6 % — AB (ref 11.5–15.5)
WBC: 17.7 10*3/uL — ABNORMAL HIGH (ref 4.0–10.5)
WBC: 12.9 10*3/uL — ABNORMAL HIGH (ref 4.0–10.5)

## 2014-02-21 LAB — GLUCOSE, CAPILLARY
GLUCOSE-CAPILLARY: 166 mg/dL — AB (ref 70–99)
Glucose-Capillary: 176 mg/dL — ABNORMAL HIGH (ref 70–99)
Glucose-Capillary: 167 mg/dL — ABNORMAL HIGH (ref 70–99)

## 2014-02-21 LAB — PROTIME-INR
INR: 1.32 (ref 0.00–1.49)
INR: 1.41 (ref 0.00–1.49)
Prothrombin Time: 16.4 seconds — ABNORMAL HIGH (ref 11.6–15.2)
Prothrombin Time: 17.3 seconds — ABNORMAL HIGH (ref 11.6–15.2)

## 2014-02-21 LAB — URINE MICROSCOPIC-ADD ON

## 2014-02-21 LAB — ABO/RH: ABO/RH(D): O POS

## 2014-02-21 LAB — MRSA PCR SCREENING: MRSA by PCR: NEGATIVE

## 2014-02-21 MED ORDER — ONDANSETRON HCL 4 MG/2ML IJ SOLN: 4.0000 mg | Freq: Three times a day (TID) | INTRAMUSCULAR | Status: DC | PRN

## 2014-02-21 MED ORDER — ENOXAPARIN SODIUM 150 MG/ML ~~LOC~~ SOLN: 1.0000 mg/kg | Freq: Two times a day (BID) | SUBCUTANEOUS | Status: DC

## 2014-02-21 MED ORDER — BISACODYL 5 MG PO TBEC
5.0000 mg | DELAYED_RELEASE_TABLET | Freq: Every day | ORAL | Status: DC | PRN
  Administered 2014-02-21: 5 mg via ORAL
  Filled 2014-02-21: qty 1

## 2014-02-21 MED ORDER — VANCOMYCIN HCL 10 G IV SOLR
1750.0000 mg | INTRAVENOUS | Status: DC
  Administered 2014-02-21 – 2014-02-23 (×3): 1750 mg via INTRAVENOUS
  Filled 2014-02-21 (×3): qty 1750

## 2014-02-21 MED ORDER — WARFARIN SODIUM 5 MG PO TABS: 5.0000 mg | ORAL_TABLET | Freq: Every day | ORAL | Status: DC

## 2014-02-21 MED ORDER — ACETAMINOPHEN 325 MG PO TABS
650.0000 mg | ORAL_TABLET | Freq: Four times a day (QID) | ORAL | Status: DC | PRN
  Administered 2014-02-22 – 2014-02-24 (×2): 650 mg via ORAL
  Filled 2014-02-21 (×2): qty 2

## 2014-02-21 MED ORDER — WARFARIN SODIUM 7.5 MG PO TABS
7.5000 mg | ORAL_TABLET | Freq: Once | ORAL | Status: DC
  Filled 2014-02-21: qty 1

## 2014-02-21 MED ORDER — MORPHINE SULFATE 2 MG/ML IJ SOLN
2.0000 mg | INTRAMUSCULAR | Status: DC | PRN
  Administered 2014-02-21 – 2014-02-26 (×22): 2 mg via INTRAVENOUS
  Filled 2014-02-21 (×23): qty 1

## 2014-02-21 MED ORDER — ACETAMINOPHEN 650 MG RE SUPP: 650.0000 mg | Freq: Four times a day (QID) | RECTAL | Status: DC | PRN

## 2014-02-21 MED ORDER — ONDANSETRON HCL 4 MG PO TABS: 4.0000 mg | ORAL_TABLET | Freq: Four times a day (QID) | ORAL | Status: DC | PRN

## 2014-02-21 MED ORDER — INSULIN ASPART 100 UNIT/ML ~~LOC~~ SOLN
0.0000 [IU] | Freq: Three times a day (TID) | SUBCUTANEOUS | Status: DC
Start: 2014-02-21 — End: 2014-02-26
  Administered 2014-02-21 – 2014-02-22 (×4): 2 [IU] via SUBCUTANEOUS
  Administered 2014-02-22: 1 [IU] via SUBCUTANEOUS
  Administered 2014-02-22: 2 [IU] via SUBCUTANEOUS
  Administered 2014-02-24: 1 [IU] via SUBCUTANEOUS
  Administered 2014-02-24: 2 [IU] via SUBCUTANEOUS
  Administered 2014-02-24: 1 [IU] via SUBCUTANEOUS
  Administered 2014-02-25 (×2): 2 [IU] via SUBCUTANEOUS
  Administered 2014-02-26: 1 [IU] via SUBCUTANEOUS

## 2014-02-21 MED ORDER — POLYETHYLENE GLYCOL 3350 17 G PO PACK
17.0000 g | PACK | Freq: Every day | ORAL | Status: DC | PRN
  Filled 2014-02-21: qty 1

## 2014-02-21 MED ORDER — ATORVASTATIN CALCIUM 40 MG PO TABS
40.0000 mg | ORAL_TABLET | Freq: Every day | ORAL | Status: DC
  Administered 2014-02-21 – 2014-02-25 (×5): 40 mg via ORAL
  Filled 2014-02-21 (×6): qty 1

## 2014-02-21 MED ORDER — ONDANSETRON HCL 4 MG/2ML IJ SOLN: 4.0000 mg | Freq: Four times a day (QID) | INTRAMUSCULAR | Status: DC | PRN

## 2014-02-21 MED ORDER — POLYETHYLENE GLYCOL 3350 17 G PO PACK
17.0000 g | PACK | Freq: Every day | ORAL | Status: DC
  Administered 2014-02-21 – 2014-02-26 (×5): 17 g via ORAL
  Filled 2014-02-21 (×4): qty 1

## 2014-02-21 MED ORDER — SODIUM CHLORIDE 0.9 % IV SOLN
INTRAVENOUS | Status: AC
Start: 2014-02-21 — End: 2014-02-21

## 2014-02-21 MED ORDER — ALLOPURINOL 100 MG PO TABS
100.0000 mg | ORAL_TABLET | Freq: Every day | ORAL | Status: DC
  Administered 2014-02-21 – 2014-02-26 (×5): 100 mg via ORAL
  Filled 2014-02-21 (×5): qty 1

## 2014-02-21 MED ORDER — HEPARIN (PORCINE) IN NACL 100-0.45 UNIT/ML-% IJ SOLN
1500.0000 [IU]/h | INTRAMUSCULAR | Status: DC
  Administered 2014-02-21: 1500 [IU]/h via INTRAVENOUS
  Filled 2014-02-21: qty 250

## 2014-02-21 MED ORDER — OXYCODONE HCL 5 MG PO TABS
5.0000 mg | ORAL_TABLET | ORAL | Status: DC | PRN
  Administered 2014-02-21 – 2014-02-26 (×5): 5 mg via ORAL
  Filled 2014-02-21 (×5): qty 1

## 2014-02-21 MED ORDER — WARFARIN - PHARMACIST DOSING INPATIENT: Freq: Every day | Status: DC

## 2014-02-21 MED ORDER — ZOLPIDEM TARTRATE 5 MG PO TABS
5.0000 mg | ORAL_TABLET | Freq: Every evening | ORAL | Status: DC | PRN
  Administered 2014-02-22 – 2014-02-25 (×4): 5 mg via ORAL
  Filled 2014-02-21 (×4): qty 1

## 2014-02-21 MED ORDER — SODIUM CHLORIDE 0.9 % IV SOLN
INTRAVENOUS | Status: DC
  Administered 2014-02-21: 02:00:00 via INTRAVENOUS

## 2014-02-21 MED ORDER — SODIUM CHLORIDE 0.9 % IJ SOLN
3.0000 mL | Freq: Two times a day (BID) | INTRAMUSCULAR | Status: DC
  Administered 2014-02-21 – 2014-02-26 (×5): 3 mL via INTRAVENOUS

## 2014-02-21 MED ORDER — FUROSEMIDE 10 MG/ML IJ SOLN: 20.0000 mg | Freq: Once | INTRAMUSCULAR | Status: DC

## 2014-02-21 MED ORDER — ASPIRIN EC 325 MG PO TBEC
325.0000 mg | DELAYED_RELEASE_TABLET | Freq: Every day | ORAL | Status: DC
  Administered 2014-02-21 – 2014-02-26 (×5): 325 mg via ORAL
  Filled 2014-02-21 (×5): qty 1

## 2014-02-21 MED ORDER — ENOXAPARIN SODIUM 150 MG/ML ~~LOC~~ SOLN
150.0000 mg | Freq: Two times a day (BID) | SUBCUTANEOUS | Status: DC
  Filled 2014-02-21 (×2): qty 1

## 2014-02-21 MED ORDER — PNEUMOCOCCAL VAC POLYVALENT 25 MCG/0.5ML IJ INJ
0.5000 mL | INJECTION | INTRAMUSCULAR | Status: DC
  Filled 2014-02-21 (×2): qty 0.5

## 2014-02-21 MED ORDER — ALUM & MAG HYDROXIDE-SIMETH 200-200-20 MG/5ML PO SUSP: 30.0000 mL | Freq: Four times a day (QID) | ORAL | Status: DC | PRN

## 2014-02-21 MED ORDER — CEFEPIME HCL 2 G IJ SOLR
2.0000 g | Freq: Two times a day (BID) | INTRAMUSCULAR | Status: DC
  Administered 2014-02-21 – 2014-02-24 (×6): 2 g via INTRAVENOUS
  Filled 2014-02-21 (×7): qty 2

## 2014-02-21 MED ORDER — GABAPENTIN 400 MG PO CAPS
400.0000 mg | ORAL_CAPSULE | Freq: Two times a day (BID) | ORAL | Status: DC
  Administered 2014-02-21 – 2014-02-26 (×10): 400 mg via ORAL
  Filled 2014-02-21 (×11): qty 1

## 2014-02-21 MED ORDER — PANTOPRAZOLE SODIUM 40 MG PO TBEC
80.0000 mg | DELAYED_RELEASE_TABLET | Freq: Every day | ORAL | Status: DC
  Administered 2014-02-21 – 2014-02-26 (×5): 80 mg via ORAL
  Filled 2014-02-21 (×5): qty 2

## 2014-02-21 MED ORDER — INSULIN ASPART 100 UNIT/ML ~~LOC~~ SOLN
0.0000 [IU] | Freq: Every day | SUBCUTANEOUS | Status: DC
  Administered 2014-02-21: 2 [IU] via SUBCUTANEOUS

## 2014-02-21 MED ORDER — PROMETHAZINE HCL 25 MG PO TABS: 25.0000 mg | ORAL_TABLET | Freq: Two times a day (BID) | ORAL | Status: DC | PRN

## 2014-02-21 NOTE — Progress Notes (Addendum)
Patient seen and evaluated earlier this AM by my associate. Please refer to her H and P for details regarding assessment and plan.  Will reassess next am.  Velvet Bathe  Was paged by nurse as patient was found to have bleeding from would site yesterday evening but is set to receive her lovenox for treatment of PE.  I have discussed case with nursing and pharmacy and plan will be to transition to heparin gtt as heparin can be reversed should we need to reverse it.  VEGA, [place]  Discontinued heparin and coumadin given active bleeding at wound.  Hgb has gone from 8.3 to 6.4.  Will reassess next am. Continue pressure dressing.  VEGA, [employer]

## 2014-02-21 NOTE — Progress Notes (Signed)
ANTIBIOTIC CONSULT NOTE - INITIAL  Pharmacy Consult for Vancomycin, cefepime  Indication: rule out sepsis  Allergies  Allergen Reactions  . Penicillins Hives    Patient Measurements: Height: 5\' 5"  (165.1 cm) Weight: 326 lb (147.873 kg) IBW/kg (Calculated) : 57 Adjusted Body Weight:   Vital Signs: Temp: 100.4 F (38 C) (06/28 0500) Temp src: Oral (06/28 0500) BP: 123/31 mmHg (06/28 0500) Pulse Rate: 84 (06/28 0500) Intake/Output from previous day: 06/27 0701 - 06/28 0700 In: 18.8 [I.V.:18.8] Out: -  Intake/Output from this shift: Total I/O In: 18.8 [I.V.:18.8] Out: -   Labs:  Recent Labs  02/20/14 2335 02/21/14 0530  WBC 13.3* 17.7*  HGB 9.0* 8.3*  PLT 370 328  CREATININE 1.42*  --    Estimated Creatinine Clearance: 57.5 ml/min (by C-G formula based on Cr of 1.42). No results found for this basename: VANCOTROUGH, VANCOPEAK, VANCORANDOM, GENTTROUGH, GENTPEAK, GENTRANDOM, TOBRATROUGH, TOBRAPEAK, TOBRARND, AMIKACINPEAK, AMIKACINTROU, AMIKACIN,  in the last 72 hours   Microbiology: No results found for this or any previous visit (from the past 720 hour(s)).  Medical History: Past Medical History  Diagnosis Date  . Hypertension   . Diabetes mellitus   . Hyperlipidemia   . CHF (congestive heart failure)   . GERD (gastroesophageal reflux disease)   . Depression   . Sleep apnea     doesn't use cpap machine or 02 at night  . Headache(784.0)     Migraines  . Arthritis     knees  . Anemia     Had blood transfusion x 2 in yhe 90s  . Lymphedema of leg   . Pulmonary embolism 02/18/14    diagnosed at [facility] with CT angio chest  . Chronic indwelling Foley catheter     Medications:  Anti-infectives   Start     Dose/Rate Route Frequency Ordered Stop   02/21/14 2200  vancomycin (VANCOCIN) 1,750 mg in sodium chloride 0.9 % 500 mL IVPB     1,750 mg 250 mL/hr over 120 Minutes Intravenous Every 24 hours 02/21/14 0551     02/21/14 1800  ceFEPIme (MAXIPIME) 2 g in  dextrose 5 % 50 mL IVPB     2 g 100 mL/hr over 30 Minutes Intravenous Every 12 hours 02/21/14 0553     02/20/14 2345  vancomycin (VANCOCIN) 1,500 mg in sodium chloride 0.9 % 500 mL IVPB     1,500 mg 250 mL/hr over 120 Minutes Intravenous  Once 02/20/14 2341 02/21/14 0411   02/20/14 2345  ceFEPIme (MAXIPIME) 2 g in dextrose 5 % 50 mL IVPB     2 g 100 mL/hr over 30 Minutes Intravenous  Once 02/20/14 2344       Assessment: Patient with sepsis.  First dose of antibiotics already given.    Goal of Therapy:  Vancomycin trough level 15-20 mcg/ml Cefepime dosed based on patient weight and renal function   Plan:  Measure antibiotic drug levels at steady state Follow up culture results Vancomycin 1750mg  iv q24hr Cefepime 2gm iv q12hr  Nani Skillern Crowford 02/21/2014,5:57 AM

## 2014-02-21 NOTE — H&P (Signed)
[facility] History and Physical  Kelly [name]:[id number] DOB: 10/01/1945 DOA: 02/20/2014  Referring physician:  Elnora Morrison PCP:  Alvester Chou, NP   Chief Complaint:  Fever, fatigue  HPI:  The patient is a 69 y.o. year-old female with history of chronic diastolic heart failure, hypertension, hyperlipidemia, diabetes mellitus type 2, structures sleep apnea, GERD, depression, and chronic lymphedema, indwelling Foley catheter who presents with fevers and fatigue.  The patient was admitted to [facility] from 6/15-6/25 for routine debridement of a chronic infected area on her left inner thigh by plastic surgery. She underwent routine debridement and panniculectomy, however post operative hospital course was complicated by catheter associated urinary tract infection, culture grew mixed flora and right upper lobe acute pulmonary embolism.  She was started on warfarin with Lovenox bridge and ciprofloxacin. She states she was having some low-grade fevers at the time she was discharged and was feeling "okay". She was discharged to skilled nursing facility, and states she normally ambulates with a rolling walker. Upon arrival at the skilled nursing facility, she develops higher grade fevers despite her antibiotics and she became very fatigued. She refers into to the emergency department today with a temperature of 103.8 Fahrenheit and severe fatigue.  She states that she has not had increasing left lower extremity pain and her wound VAC is in working well. She denies any respiratory symptoms such as sinus congestion, increased cough or shortness of breath. She has had some increased suprapubic discomfort consistent with urinary tract infection. Her urine has been increasingly cloudy.  In the emergency department, she was found to have a temperature of 103.8 Fahrenheit which decreased to 100.6 Fahrenheit with IV fluids and Tylenol. Her blood pressure went as low as 80 systolic but has  been relatively stable around the low 474 systolic. She was initially tachycardic to the 120s and her heart rate is down to the 80s with IV fluids.  Labs were notable for WBC 13.3 with neutrophil predominance, hemoglobin at baseline near 9 (anemia of chronic disease and renal disease), creatinine 1.42 (baseline CKD satge 3).  Lactic acid 1.3.  UA pending but urine very cloudy.  CXR negative.  BCx pending.  Given vanc and cefepime in ER.    GEN:  Positive fevers, chills, denies weight loss or gain HEENT:  Denies changes to hearing and vision, rhinorrhea, sinus congestion, sore throat CV:  Denies chest pain and palpitations, chronic stable lower extremity edema.  PULM:  Denies SOB, wheezing, cough.   GI:  Denies nausea, vomiting, constipation, diarrhea.   GU:  Denies dysuria, frequency, urgency due to indwelling foley but increased suprapubic discomfort ENDO:  Denies polyuria, polydipsia.   HEME:  Denies hematemesis, blood in stools, melena, abnormal bruising or bleeding.  LYMPH:  Denies lymphadenopathy.   MSK:  Denies arthralgias, myalgias.   DERM:  Denies skin rash or ulcer.   NEURO:  Denies focal numbness, weakness, slurred speech, confusion, facial droop.  PSYCH:  Denies anxiety and depression.    Past Medical History  Diagnosis Date  . Hypertension   . Diabetes mellitus   . Hyperlipidemia   . CHF (congestive heart failure)   . GERD (gastroesophageal reflux disease)   . Depression   . Sleep apnea     doesn't use cpap machine or 02 at night  . Headache(784.0)     Migraines  . Arthritis     knees  . Anemia     Had blood transfusion x 2 in yhe 90s  .  Lymphedema of leg   . Pulmonary embolism 02/18/14    diagnosed at [facility] with CT angio chest  . Chronic indwelling Foley catheter    Past Surgical History  Procedure Laterality Date  . Rotator cuff repair      right  . Knee arthroscopy      right  . Temple biopsy    . Colonoscopy with propofol  09/23/2012    Procedure: COLONOSCOPY  WITH PROPOFOL;  Surgeon: Jerene Bears, MD;  Location: [facility];  Service: Gastroenterology;  Laterality: N/A;  . Paniculectomy with wound debridement Left 02/08/2014    left medial thigh   Social History:  reports that she quit smoking about 32 years ago. Her smoking use included Cigarettes. She smoked 0.00 packs per day. She has never used smokeless tobacco. She reports that she does not drink alcohol or use illicit drugs. Rolling walker, from SNF for rehab from left leg surgery  Allergies  Allergen Reactions  . Penicillins Hives    Family History  Problem Relation Age of Onset  . Emphysema Father   . Asthma Brother   . Heart disease Mother   . Stomach cancer Brother   . Heart disease Maternal Grandmother   . Diabetes Mother   . Diabetes Sister      Prior to Admission medications   Medication Sig Start Date End Date Taking? Authorizing Provider  allopurinol (ZYLOPRIM) 100 MG tablet Take 100 mg by mouth daily.    Yes Historical Provider, MD  aspirin 325 MG EC tablet Take 325 mg by mouth daily.   Yes Historical Provider, MD  atorvastatin (LIPITOR) 40 MG tablet Take 40 mg by mouth daily.    Yes Historical Provider, MD  chlorthalidone (HYGROTON) 25 MG tablet Take 12.5 mg by mouth daily.   Yes Historical Provider, MD  ciprofloxacin (CIPRO) 500 MG tablet Take 500 mg by mouth 2 (two) times daily. 8 day therapy course patient began on 02/18/14   Yes Historical Provider, MD  ENOXAPARIN SODIUM [place] Inject 160 mg into the skin 2 (two) times daily.   Yes Historical Provider, MD  esomeprazole (NEXIUM) 40 MG capsule Take 40 mg by mouth daily before breakfast.   Yes Historical Provider, MD  gabapentin (NEURONTIN) 300 MG capsule Take 300 mg by mouth 3 (three) times daily.   Yes Historical Provider, MD  isosorbide mononitrate (ISMO,MONOKET) 10 MG tablet Take 10 mg by mouth 2 (two) times daily.   Yes Historical Provider, MD  losartan (COZAAR) 25 MG tablet Take 25 mg by mouth daily.   Yes Historical  Provider, MD  magnesium oxide (MAG-OX) 400 MG tablet Take 400 mg by mouth daily.    Yes Historical Provider, MD  metoprolol tartrate (LOPRESSOR) 25 MG tablet Take 37.5 mg by mouth 2 (two) times daily.    Yes Historical Provider, MD  oxyCODONE (OXY IR/ROXICODONE) 5 MG immediate release tablet Take 5 mg by mouth every 4 (four) hours as needed for severe pain (for pain).   Yes Historical Provider, MD  oxyCODONE-acetaminophen (PERCOCET) 7.5-325 MG per tablet Take 1 tablet by mouth every 4 (four) hours as needed for pain.   Yes Historical Provider, MD  polyethylene glycol (MIRALAX / GLYCOLAX) packet Take 17 g by mouth daily.   Yes Historical Provider, MD  promethazine (PHENERGAN) 25 MG tablet Take 25 mg by mouth 2 (two) times daily as needed for nausea or vomiting.   Yes Historical Provider, MD  warfarin (COUMADIN) 5 MG tablet Take 5 mg by mouth  daily.   Yes Historical Provider, MD  zolpidem (AMBIEN CR) 6.25 MG CR tablet Take 6.25 mg by mouth at bedtime as needed for sleep.   Yes Historical Provider, MD   Physical Exam: Filed Vitals:   02/20/14 2330 02/21/14 0111  BP: 138/53 103/43  Pulse: 125 89  Temp:  100.6 F (38.1 C)  TempSrc:  Oral  Resp: 20 29  SpO2: 94% 93%     General:   obese black female, no acute distress, awake with eyes closed and able to answer questions   Eyes:  PERRL, anicteric, non-injected.  ENT:  Nares clear.  OP clear, non-erythematous without plaques or exudates.  MMM.  Neck:  Supple difficult to discern JVP or thyromegaly given body habitus   , Lymph:  No cervical, supraclavicular, or submandibular LAD.  Cardiovascular:  RRR, normal S1, S2,  2/6 systolic murmur rumbling at apex and left sternal border but also heard at right upper sternal border.  2+ pulses, warm extremities  Respiratory:  CTA bilaterally without increased WOB.  Abdomen:  NABS.  Soft, ND/NT.    Skin:  wound VAC in place on left leg. Good suction. Appears to have some purulence of the fluid. No  surrounding erythema or streaking. The skin is very indurated and edematous but this may be chronic   Musculoskeletal:  Normal bulk and tone.   2+ pitting bilateral lower extremity edema .  Psychiatric:  Awake but sleepy & O x 4.  Appropriate affect.  Neurologic:  CN 3-12 intact.  5/5 strength.  Sensation intact.  Labs on Admission:  Basic Metabolic Panel:  Recent Labs Lab 02/20/14 2335  NA 135*  K 5.3  CL 102  CO2 20  GLUCOSE 174*  BUN 24*  CREATININE 1.42*  CALCIUM 8.1*   Liver Function Tests:  Recent Labs Lab 02/20/14 2335  AST 23  ALT 25  ALKPHOS 153*  BILITOT 0.2*  PROT 7.0  ALBUMIN 1.9*   No results found for this basename: LIPASE, AMYLASE,  in the last 168 hours No results found for this basename: AMMONIA,  in the last 168 hours CBC:  Recent Labs Lab 02/20/14 2335  WBC 13.3*  NEUTROABS 12.2*  HGB 9.0*  HCT 28.0*  MCV 89.7  PLT 370   Cardiac Enzymes: No results found for this basename: CKTOTAL, CKMB, CKMBINDEX, TROPONINI,  in the last 168 hours  BNP (last 3 results) No results found for this basename: PROBNP,  in the last 8760 hours CBG: No results found for this basename: GLUCAP,  in the last 168 hours  Radiological Exams on Admission: Dg Chest Port 1 View  02/20/2014   CLINICAL DATA:  Fever.  Weakness.  EXAM: PORTABLE CHEST - 1 VIEW  COMPARISON:  11/17/2012  FINDINGS: No cardiomegaly for technique. Unchanged prominence of the pulmonary vasculature. No edema, consolidation, effusion, or pneumothorax. No acute osseous findings. Degenerative thoracic endplate spurring.  IMPRESSION: No definitive pneumonia.   Electronically Signed   By: Jorje Guild M.D.   On: 02/20/2014 23:51    EKG: Independently reviewed.  Sinus tachycardia with RBBB similar to prior.   Assessment/Plan Active Problems:   HYPERTENSION   Diabetes mellitus, type 2   Obesity hypoventilation syndrome   Chronic diastolic heart failure   Severe sepsis   Pulmonary embolism    UTI (urinary tract infection) due to urinary indwelling Foley catheter   Sinus tachycardia   Chronic kidney disease, stage 3   Leukocytosis   Anemia of chronic renal failure, stage  3 (moderate)  ---  Severe sepsis due to probable pyelonephritis secondary to indwelling foley catheter, present on admission, however, she also had a some suggestion of PNA on her CT scan from [facility] a few days ago and she recently had surgery on her leg with previous hx of MRSA skin and soft tissue infections.  I think she would have more respiratory related symptoms if this was related to progressive bacterial pneumonia and her leg has not been more swollen, red or painful to suggest wound infection, however, still possible.   -  Stepdown -  IVF  -  Continue vancomycin and cefepime -  F/u blood cx -  F/u urine cx -  Consider transfer to [facility] for reevaluation of wound  -  Continue tylenol for fever and consider cooling blanket if needed -  Would avoid NSAIDS due to CKD  Hypotension in patient with HTN, transient in ER -  Hold BB for now and restart at low dose once blood pressure more stable -  Hold imdur, ARB, and diuretic -  Continue judicious IVF  Chronic diastolic heart failure -  Had ECHO a few days ago at [facility] with preserved EF  -  Judicious use of IVF  Sinus tachycardia secondary to sepsis and fever, trending down with IVF and antipyretics -  Continue tx as above  X5OI with complication of diabetic neuropathy, CBG stable -  A1c -  SSI  Acute pulmonary embolism dx only a few days ago, INR subtherapeutic -  Continue lovenox and warfarin, dosed per pharmacy  CKD stage 3, creatinine at baseline -  Minimize nephrotoxins and renally dose medications  Gout, stable, continue allopurinol, renally dosed  Diabetic neuropathy, stable, change gabapentin to 400mg  BID to renally dose  Leukocytosis, likely related to underlying infection -  Repeat in AM  Anemia of renal parenchymal  disease/chronic disease, at baseline -  Repeat in AM  Diet:  diabetic Access:  PIV IVF:  yes Proph:  Therapeutic lovenox  Code Status: Full Family Communication: patient alone Disposition Plan: Admit to stepdown  Time spent: 60 min SHORT, MACKENZIE [facility] Pager [phone]  If 7PM-7AM, please contact night-coverage [email or web address] Password [email or web address] 02/21/2014, 1:45 AM

## 2014-02-21 NOTE — Progress Notes (Signed)
Patient's right upper thigh bleeding steadily from two areas.  Bed pad, part of sheet and dressing saturated with blood.   Immediately turned off heparin drip and notified Dr. Wendee Beavers.  Pressure dressing reapplied.  Packing not removed due to bleeding.  CBC and discontinuation of heparin drip ordered.  Patient is alert and oriented.  Vital sighs taken, see flow sheet.

## 2014-02-21 NOTE — Progress Notes (Signed)
ANTICOAGULATION CONSULT NOTE - Initial Consult  Pharmacy Consult for warfarin Indication: pulmonary embolus  Allergies  Allergen Reactions  . Penicillins Hives    Patient Measurements: Height: 5\' 5"  (165.1 cm) Weight: 326 lb (147.873 kg) IBW/kg (Calculated) : 57 Heparin Dosing Weight:   Vital Signs: Temp: 100.4 F (38 C) (06/28 0500) Temp src: Oral (06/28 0500) BP: 123/31 mmHg (06/28 0500) Pulse Rate: 84 (06/28 0500)  Labs:  Recent Labs  02/20/14 2335 02/21/14 0530  HGB 9.0* 8.3*  HCT 28.0* 25.9*  PLT 370 328  LABPROT 16.4* 17.3*  INR 1.32 1.41  CREATININE 1.42*  --     Estimated Creatinine Clearance: 57.5 ml/min (by C-G formula based on Cr of 1.42).   Medical History: Past Medical History  Diagnosis Date  . Hypertension   . Diabetes mellitus   . Hyperlipidemia   . CHF (congestive heart failure)   . GERD (gastroesophageal reflux disease)   . Depression   . Sleep apnea     doesn't use cpap machine or 02 at night  . Headache(784.0)     Migraines  . Arthritis     knees  . Anemia     Had blood transfusion x 2 in yhe 90s  . Lymphedema of leg   . Pulmonary embolism 02/18/14    diagnosed at [facility] with CT angio chest  . Chronic indwelling Foley catheter     Medications:  Scheduled:  . allopurinol  100 mg Oral Daily  . aspirin  325 mg Oral Daily  . atorvastatin  40 mg Oral q1800  . ceFEPime (MAXIPIME) IV  2 g Intravenous Once  . ceFEPime (MAXIPIME) IV  2 g Intravenous Q12H  . enoxaparin (LOVENOX) injection  150 mg Subcutaneous Q12H  . gabapentin  400 mg Oral BID  . insulin aspart  0-5 Units Subcutaneous QHS  . insulin aspart  0-9 Units Subcutaneous TID WC  . pantoprazole  80 mg Oral Q1200  . polyethylene glycol  17 g Oral Daily  . sodium chloride  3 mL Intravenous Q12H  . vancomycin  1,750 mg Intravenous Q24H  . warfarin  7.5 mg Oral ONCE-1800  . Warfarin - Pharmacist Dosing Inpatient   Does not apply q1800    Assessment: Patient with history of  PE.  INR <2 on admit, on lovenox bridge.    Goal of Therapy:  INR 2-3    Plan:  Start with Coumadin 7.5 mg tonight. Check PT/INR daily. Provide Coumadin education.   Tyler Deis, Shea Stakes Crowford 02/21/2014,5:55 AM

## 2014-02-21 NOTE — Progress Notes (Signed)
Received critical lab value that pt's hgb was 6.4. It was 8.3 this am. Orders received to transfuse 2 units of PRBC. Pt had a large amount of blood loss noted when she was transferred from Stepdown this afternoon. Heparin gtt stopped at 1530 per MD orders. Pt was stable at the time of transfer, BP was 118/34. Continued to monitor pt. When the blood was ready in the blood bank we were changing the pt's linens as they were soiled from the blood draining from the left inner thigh wound. While obtaining pre-blood vital signs it was noted that pt's temp was 100.3. MD notified to be sure that blood should still be given. While awaiting call back from MD, it was noted that pt's BP was 83/35, HR 90, Resp 22, O2Sat 100% on RA. MD made aware of pt's temp and BP. Orders received to give the first unit of blood regardless of temp, and to infuse that unit as a bolus to help with pt's BP. Originally, MD had ordered 20mg  of IV lasix to be given in between units d/t pt's hx of CHF, however, MD gave orders to d/c the lasix med order and to not give anything in between units d/t pt's low BP. Also during this time, an order was received to transfer pt back to [facility], as she was going to require more 1:1 care by the RN and our floor is unable to provide that care d/t pt ratios. MD also made aware at this time that the left leg wound was still bleeding a moderate, steady amount from a few locations. New pressure dressings had been applied at 1837 and one of them was already saturated when MD was notified at [place] of pt's status changes. Pt transported to the Stepdown unit with rapid response RN. Floor RN accompanied pt and staff to the stepdown unit. Report was given to the Stepdown RN at the bedside. Pt's BP on arrival to stepdown unit was 115/24. That RN was going to continue to monitor pt's status.

## 2014-02-21 NOTE — Progress Notes (Signed)
Upon arrival to room 1222 from ED pt left inner thigh dressing leaking from wound vac. Prepared wound vac supplies to change dressing. Old dressing removed to find 3 large deep wounds with foam packed inside as well as staples intact closing parts of the wound. The middle wound and proximal wound had a very deep packed foam, with a moderate amount of blood draining around it. Jonette Eva paged prior to attempting dressing change. Jonette Eva at bedside with verbal order to change the dressing with simple wet to dry and remove the black foam packed inside. Upon attempt to remove the proximal foam there was a great amount of resistance felt and a large amount of blood loss to the site. Black foam inside proximal wound very deep. Jonette Eva at bedside verbal orders to leave black foam in and place moist gauze with a pressure dressing, Jonette Eva to notify Surgical service to see today. CBC ordered this am. Dressing completed with Lucila Maine RN.

## 2014-02-21 NOTE — Progress Notes (Addendum)
ANTICOAGULATION CONSULT NOTE - Initial Consult  Pharmacy Consult for:  IV Heparin Indication:  Pulmonary embolism, bridging to warfarin  Allergies  Allergen Reactions  . Penicillins Hives    Patient Measurements: Height: 5\' 5"  (165.1 cm) Weight: 326 lb (147.873 kg) IBW/kg (Calculated) : 57 Heparin Dosing Weight: 94.2 kg  Vital Signs: Temp: 99.3 F (37.4 C) (06/28 0800) Temp src: Oral (06/28 0800) BP: 130/37 mmHg (06/28 0800) Pulse Rate: 81 (06/28 0800)  Labs:  Recent Labs  02/20/14 2335 02/21/14 0530  HGB 9.0* 8.3*  HCT 28.0* 25.9*  PLT 370 328  LABPROT 16.4* 17.3*  INR 1.32 1.41  CREATININE 1.42* 1.62*    Estimated Creatinine Clearance: 50.4 ml/min (by C-G formula based on Cr of 1.62).   Medical History: Past Medical History  Diagnosis Date  . Hypertension   . Diabetes mellitus   . Hyperlipidemia   . CHF (congestive heart failure)   . GERD (gastroesophageal reflux disease)   . Depression   . Sleep apnea     doesn't use cpap machine or 02 at night  . Headache(784.0)     Migraines  . Arthritis     knees  . Anemia     Had blood transfusion x 2 in yhe 90s  . Lymphedema of leg   . Pulmonary embolism 02/18/14    diagnosed at [facility] with CT angio chest  . Chronic indwelling Foley catheter     Medications:  Scheduled:  . allopurinol  100 mg Oral Daily  . aspirin  325 mg Oral Daily  . atorvastatin  40 mg Oral q1800  . ceFEPime (MAXIPIME) IV  2 g Intravenous Q12H  . gabapentin  400 mg Oral BID  . insulin aspart  0-5 Units Subcutaneous QHS  . insulin aspart  0-9 Units Subcutaneous TID WC  . pantoprazole  80 mg Oral Q1200  . polyethylene glycol  17 g Oral Daily  . sodium chloride  3 mL Intravenous Q12H  . vancomycin  1,750 mg Intravenous Q24H  . warfarin  7.5 mg Oral ONCE-1800  . Warfarin - Pharmacist Dosing Inpatient   Does not apply q1800    Assessment:  Asked to assist with IV Heparin therapy for this 68 year-old female with pulmonary embolism.   Initiation of warfarin therapy is ongoing.  Significant bleeding from wound site last evening was reported, and a change from Lovenox to Heparin has been ordered.  The last dose of Lovenox 160 mg subq was given at 2200 6/27.  Goals of Therapy:   Heparin level 0.3-0.7 units/ml  Monitor platelets by anticoagulation protocol: Yes   Plan:   Begin Heparin infusion at 1500 units/hr.  No bolus dose in view of concern for bleeding.  Heparin level 6 hours after starting the infusion.  Heparin level and CBC daily   Monitor for bleeding adverse effects  [employer] R.Ph. 02/21/2014,11:17 AM

## 2014-02-22 ENCOUNTER — Other Ambulatory Visit: Payer: Self-pay | Admitting: Plastic Surgery

## 2014-02-22 DIAGNOSIS — S91002A Unspecified open wound, left ankle, initial encounter: Principal | ICD-10-CM

## 2014-02-22 DIAGNOSIS — S81802A Unspecified open wound, left lower leg, initial encounter: Principal | ICD-10-CM

## 2014-02-22 DIAGNOSIS — S91009A Unspecified open wound, unspecified ankle, initial encounter: Secondary | ICD-10-CM

## 2014-02-22 DIAGNOSIS — S81002A Unspecified open wound, left knee, initial encounter: Secondary | ICD-10-CM

## 2014-02-22 DIAGNOSIS — S81009A Unspecified open wound, unspecified knee, initial encounter: Secondary | ICD-10-CM

## 2014-02-22 DIAGNOSIS — D62 Acute posthemorrhagic anemia: Secondary | ICD-10-CM | POA: Diagnosis present

## 2014-02-22 DIAGNOSIS — I959 Hypotension, unspecified: Secondary | ICD-10-CM

## 2014-02-22 DIAGNOSIS — A419 Sepsis, unspecified organism: Secondary | ICD-10-CM

## 2014-02-22 DIAGNOSIS — S81809A Unspecified open wound, unspecified lower leg, initial encounter: Secondary | ICD-10-CM

## 2014-02-22 LAB — GLUCOSE, CAPILLARY
GLUCOSE-CAPILLARY: 211 mg/dL — AB (ref 70–99)
GLUCOSE-CAPILLARY: 137 mg/dL — AB (ref 70–99)
GLUCOSE-CAPILLARY: 172 mg/dL — AB (ref 70–99)
Glucose-Capillary: 159 mg/dL — ABNORMAL HIGH (ref 70–99)
Glucose-Capillary: 165 mg/dL — ABNORMAL HIGH (ref 70–99)

## 2014-02-22 LAB — CBC
HCT: 24.1 % — ABNORMAL LOW (ref 36.0–46.0)
Hemoglobin: 8.2 g/dL — ABNORMAL LOW (ref 12.0–15.0)
MCH: 29.8 pg (ref 26.0–34.0)
MCHC: 34 g/dL (ref 30.0–36.0)
MCV: 87.6 fL (ref 78.0–100.0)
Platelets: 287 10*3/uL (ref 150–400)
RBC: 2.75 MIL/uL — ABNORMAL LOW (ref 3.87–5.11)
RDW: 15.8 % — AB (ref 11.5–15.5)
WBC: 13.8 10*3/uL — ABNORMAL HIGH (ref 4.0–10.5)

## 2014-02-22 LAB — BASIC METABOLIC PANEL
BUN: 31 mg/dL — ABNORMAL HIGH (ref 6–23)
CO2: 18 meq/L — AB (ref 19–32)
CREATININE: 1.6 mg/dL — AB (ref 0.50–1.10)
Calcium: 7.6 mg/dL — ABNORMAL LOW (ref 8.4–10.5)
Chloride: 103 mEq/L (ref 96–112)
GFR calc Af Amer: 38 mL/min — ABNORMAL LOW (ref >90)
GFR, EST NON AFRICAN AMERICAN: 33 mL/min — AB (ref >90)
GLUCOSE: 181 mg/dL — AB (ref 70–99)
Potassium: 5.3 mEq/L (ref 3.7–5.3)
Sodium: 134 mEq/L — ABNORMAL LOW (ref 137–147)

## 2014-02-22 LAB — PROTIME-INR
INR: 1.57 — AB (ref 0.00–1.49)
INR: 1.39 (ref 0.00–1.49)
PROTHROMBIN TIME: 18.8 s — AB (ref 11.6–15.2)
Prothrombin Time: 17.1 seconds — ABNORMAL HIGH (ref 11.6–15.2)

## 2014-02-22 LAB — PREPARE RBC (CROSSMATCH)

## 2014-02-22 LAB — HEMOGLOBIN A1C
Hgb A1c MFr Bld: 6.1 % — ABNORMAL HIGH (ref <5.7)
Mean Plasma Glucose: 128 mg/dL — ABNORMAL HIGH (ref <117)

## 2014-02-22 MED ORDER — HEPARIN (PORCINE) IN NACL 100-0.45 UNIT/ML-% IJ SOLN
1000.0000 [IU]/h | INTRAMUSCULAR | Status: DC
  Filled 2014-02-22: qty 250

## 2014-02-22 MED ORDER — GLUCERNA SHAKE PO LIQD
237.0000 mL | Freq: Three times a day (TID) | ORAL | Status: DC
  Administered 2014-02-22 – 2014-02-26 (×7): 237 mL via ORAL
  Filled 2014-02-22 (×13): qty 237

## 2014-02-22 NOTE — Progress Notes (Signed)
[facility] PROGRESS NOTE  VEGAS COFFIN ALP:[id number] DOB: [birth year] DOA: 02/20/2014 PCP: Alvester Chou, NP  Assessment/Plan: Active Problems: Sepsis associated hypotension - s/p fluid boluses and transfusion of PRBC's. - improved and continue to monitor. Fluid boluses judiciously given Diastolic CHF history    Severe sepsis -  Source most likely urinary - Continue Cefepime and Vancomycin - monitor closely in ICU    Diabetes mellitus type 2 with neurological manifestations - SSI and diabetic diet    Chronic diastolic heart failure - given hypotension will hold medications which may decrease her blood pressures - gentle fluid hydration.    Pulmonary embolism - complicated by bleeding wound from recent operation performed by plastic surgeon at [facility] (debridement) - Placed on heparin gtt but patient developed significant bleeding from wound and as such required discontinuation in lieu of decreased hemoglobin from 8.3 to 6.4.   - Pt is s/p transfusion and bleeding has ameliorated. Discussed with hematologist who recommended lower dose of heparin.  Pt to be evaluated by Surgeon later today.  Acute blood loss anemia - Requiring transfusion of 2 units of PRBC - Pt has h/o CKD related anemia at baseline per EMR - Continue to monitor with serial cbc's - Consulted Dr. Theodoro Kos associate of Dr. Luetta Nutting (surgeon who performed recent LLE debridement) who will evaluate patient. Would like to thank Dr. Migdalia Dk for her assistance with this case.     UTI (urinary tract infection) due to urinary indwelling Foley catheter - Awaiting urine culture - continue broad spectrum antibiotics   Code Status: full Family Communication: None Disposition Plan: Pending further evaluation and recommendations from specialist.   Consultants:  Plastic Surgery: Dr. Migdalia Dk  Pharmacy  Procedures:  none  Antibiotics:  Cefepime and Vancomycin  HPI/Subjective: [facility] for further evaluation. He recommended I discuss with Dr. Migdalia Dk which I have and she has agreed to evaluation patient. Pt currently feels better and has no new complaints.  Objective: Filed Vitals:   02/22/14 0800  BP:   Pulse:   Temp: 99.9 F (37.7 C)  Resp:     Intake/Output Summary (Last 24 hours) at 02/22/14 1001 Last data filed at 02/22/14 0610  Gross per 24 hour  Intake 2267.5 ml  Output   1100 ml  Net 1167.5 ml   Filed Weights   02/21/14 0445 02/22/14 0500  Weight: 147.873 kg (326 lb) 147.873 kg (326 lb)    Exam:   General:  Pt in NAD, alert and awake  Cardiovascular: RRR, no MRG  Respiratory: CTA BL, no wheezes, no increased wob  Abdomen: soft, NT, ND  Musculoskeletal: no cyanosis or clubbing, guaze in place over LLE   Data Reviewed: Basic Metabolic Panel:  Recent Labs Lab 02/20/14 2335 02/21/14 0530 02/22/14 0200  NA 135* 136* 134*  K 5.3 5.5* 5.3  CL 102 104 103  CO2 20 19 18*  GLUCOSE 174* 171* 181*  BUN 24* 26* 31*  CREATININE 1.42* 1.62* 1.60*  CALCIUM 8.1* 8.0* 7.6*   Liver Function Tests:  Recent Labs Lab 02/20/14 2335  AST 23  ALT 25  ALKPHOS 153*  BILITOT 0.2*  PROT 7.0  ALBUMIN 1.9*   No results found for this basename: LIPASE, AMYLASE,  in the last 168 hours No results found for this basename: AMMONIA,  in the last 168 hours CBC:  Recent Labs Lab 02/20/14 2335 02/21/14 0530 02/21/14 1616 02/22/14 0200  WBC 13.3* 17.7* 12.9* 13.8*  NEUTROABS 12.2*  --   --   --   HGB 9.0* 8.3* 6.4* 8.2*  HCT 28.0* 25.9* 20.2* 24.1*  MCV 89.7 90.2 91.0 87.6  PLT 370 328 271 287   Cardiac Enzymes: No results found for this basename: CKTOTAL, CKMB, CKMBINDEX, TROPONINI,  in the last 168 hours BNP (last 3 results) No results found for this basename: PROBNP,  in the last 8760 hours CBG:  Recent Labs Lab 02/21/14 0755 02/21/14 1222 02/21/14 1712 02/21/14 2241 02/22/14 0742  GLUCAP 166* 176* 167* 211*  159*    Recent Results (from the past 240 hour(s))  CULTURE, BLOOD (ROUTINE X 2)     Status: None   Collection Time    02/20/14 11:06 PM      Result Value Ref Range Status   Specimen Description BLOOD LEFT HAND   Final   Special Requests BOTTLES DRAWN AEROBIC AND ANAEROBIC 3CC   Final   Culture  Setup Time     Final   Value: 02/21/2014 16:18     Performed at [facility]   Culture     Final   Value:        BLOOD CULTURE RECEIVED NO GROWTH TO DATE CULTURE WILL BE HELD FOR 5 DAYS BEFORE ISSUING A FINAL NEGATIVE REPORT     Performed at [facility]   Report Status PENDING   Incomplete  CULTURE, BLOOD (ROUTINE X 2)     Status: None   Collection Time    02/20/14 11:35 PM      Result Value Ref Range Status   Specimen Description BLOOD LEFT ANTECUBITAL   Final   Special Requests BOTTLES DRAWN AEROBIC AND ANAEROBIC 5CC   Final   Culture  Setup Time     Final   Value: 02/21/2014 16:18     Performed at [facility]   Culture     Final   Value:        BLOOD CULTURE RECEIVED NO GROWTH TO DATE CULTURE WILL BE HELD FOR 5 DAYS BEFORE ISSUING A FINAL NEGATIVE REPORT     Performed at [facility]   Report Status PENDING   Incomplete  MRSA PCR SCREENING     Status: None   Collection Time    02/21/14  5:37 AM      Result Value Ref Range Status   MRSA by PCR NEGATIVE  NEGATIVE Final   Comment:            The GeneXpert MRSA Assay (FDA     approved for NASAL specimens     only), is one component of a     comprehensive MRSA colonization     surveillance program. It is not     intended to diagnose MRSA     infection nor to guide or     monitor treatment for     MRSA infections.     Studies: Dg Chest Port 1 View  02/20/2014   CLINICAL DATA:  Fever.  Weakness.  EXAM: PORTABLE CHEST - 1 VIEW  COMPARISON:  11/17/2012  FINDINGS: No cardiomegaly for technique. Unchanged prominence of the pulmonary vasculature. No edema, consolidation, effusion, or pneumothorax. No  acute osseous findings. Degenerative thoracic endplate spurring.  IMPRESSION: No definitive pneumonia.   Electronically Signed   By: Jorje Guild M.D.   On: 02/20/2014 23:51    Scheduled Meds: . allopurinol  100 mg Oral Daily  . aspirin  325 mg Oral Daily  . atorvastatin  40 mg Oral q1800  . ceFEPime (MAXIPIME) IV  2 g Intravenous Q12H  . gabapentin  400 mg Oral BID  . insulin aspart  0-5 Units Subcutaneous QHS  . insulin aspart  0-9 Units Subcutaneous TID WC  . pantoprazole  80 mg Oral Q1200  . pneumococcal 23 valent vaccine  0.5 mL Intramuscular Tomorrow-1000  . polyethylene glycol  17 g Oral Daily  . sodium chloride  3 mL Intravenous Q12H  . vancomycin  1,750 mg Intravenous Q24H   Continuous Infusions:     Time spent: > 75 minutes of critical care time. Medical decision making, discussion of care with Drs Elta Guadeloupe and [place], discussion with Pharmacist, placing orders, documenting, physical exam, and discussion with patient.    Velvet Bathe  [facility] Pager [phone] If 7PM-7AM, please contact night-coverage at [email or web address], password [facility] 02/22/2014, 10:01 AM  LOS: 2 days

## 2014-02-22 NOTE — Progress Notes (Signed)
[place] for Heparin Indication: Pulmonary embolus  Allergies  Allergen Reactions  . Penicillins Hives    Patient Measurements: Height: 5\' 5"  (165.1 cm) Weight: 326 lb (147.873 kg) IBW/kg (Calculated) : 57   Vital Signs: Temp: 99.9 F (37.7 C) (06/29 0800) Temp src: Oral (06/29 0800) BP: 120/39 mmHg (06/29 0616) Pulse Rate: 95 (06/29 0600)  Labs:  Recent Labs  02/20/14 2335 02/21/14 0530 02/21/14 1616 02/22/14 0200 02/22/14 1030  HGB 9.0* 8.3* 6.4* 8.2* 6.4*  HCT 28.0* 25.9* 20.2* 24.1* 19.5*  PLT 370 328 271 287 PLATELET CLUMPS NOTED ON SMEAR, COUNT APPEARS ADEQUATE  LABPROT 16.4* 17.3*  --  18.8* 17.1*  INR 1.32 1.41  --  1.57* 1.39  CREATININE 1.42* 1.62*  --  1.60*  --     Estimated Creatinine Clearance: 51 ml/min (by C-G formula based on Cr of 1.6).   Medical History: Past Medical History  Diagnosis Date  . Hypertension   . Diabetes mellitus   . Hyperlipidemia   . CHF (congestive heart failure)   . GERD (gastroesophageal reflux disease)   . Depression   . Sleep apnea     doesn't use cpap machine or 02 at night  . Headache(784.0)     Migraines  . Arthritis     knees  . Anemia     Had blood transfusion x 2 in yhe 90s  . Lymphedema of leg   . Pulmonary embolism 02/18/14    diagnosed at [facility] with CT angio chest  . Chronic indwelling Foley catheter     Medications:  Scheduled:  . allopurinol  100 mg Oral Daily  . aspirin  325 mg Oral Daily  . atorvastatin  40 mg Oral q1800  . ceFEPime (MAXIPIME) IV  2 g Intravenous Q12H  . gabapentin  400 mg Oral BID  . insulin aspart  0-5 Units Subcutaneous QHS  . insulin aspart  0-9 Units Subcutaneous TID WC  . pantoprazole  80 mg Oral Q1200  . pneumococcal 23 valent vaccine  0.5 mL Intramuscular Tomorrow-1000  . polyethylene glycol  17 g Oral Daily  . sodium chloride  3 mL Intravenous Q12H  . vancomycin  1,750 mg Intravenous Q24H   Infusions:  . heparin      PRN: acetaminophen, acetaminophen, alum & mag hydroxide-simeth, bisacodyl, morphine injection, ondansetron (ZOFRAN) IV, ondansetron, oxyCODONE, polyethylene glycol, promethazine, zolpidem  Assessment: 68 y/o F with pulmonary embolism diagnosed earlier this month at [facility], was discharged on full-dose Lovenox 160 mg SQ q12h with transition to warfarin in progress.  Patient presented to [facility] ED on 6/28 with T 103.8 and fatigue, was admitted with probable pyelonephritis.  PMH is complex but includes chronic infected area on inner thigh that was recently debrided.    Lovenox was converted to Heparin IV to provide a more readily reversible anticoagulant in case interruption should be required for a procedure or for bleeding.   On 6/28 heparin was started at 1500 units/hr with no bolus.  Less than four hours later the patient began bleeding heavily from upper thigh wound with Hgb drop to 6.4 requiring discontinuation of heparin and PRBC transfusions.  Today Dr. Wendee Beavers consulted hematology and was advised to try resuming heparin but at a lower dosage.  Patient is reportedly not an ideal candidate for IVC filter placement at present due to comorbidities.   Goal of Therapy:  Heparin level approximately 0.3 units/mL (= lower end of therapeutic range) if tolerated. Monitor platelets by  anticoagulation protocol: Yes   Plan:  1. Begin heparin infusion at 1000 units/hr (NO BOLUS) 2. Check heparin level at 6pm, then every AM.    3. CBC daily 4. Monitor for signs and symptoms of recurrent bleeding.   Clayburn Pert, PharmD, BCPS Pager: [phone] 02/22/2014  11:14   ADDENDUM: Updated orders received from Dr. Wendee Beavers to wait until Hgb > 8.0 before starting heparin. Plan: 1. F/U on Hgb level post-transfusion 2. Delay first heparin level until 6 hours after heparin begins.  Clayburn Pert, PharmD, BCPS Pager: [phone] 02/22/2014  12:18 PM

## 2014-02-22 NOTE — Progress Notes (Signed)
Patient transferred to 1222 19:30 02/21/14. Left leg has old incision from the knee to groin and around to the posterior left thigh. There are three areas between the staples that are open of varying sizes. They are packed with gauze and have serousanginous to light bloody drainage. 22:30 posterior thigh was covered with gauze and ABD pad the inner thigh was changed and covered with gauze and ABD pad for a pressure type dressing and the knee was also covered with gauze.   01:30-- increased bloody drainage and all areas changed or reinforced. As the patient turned to her right the left inner thigh had copious bright red bloody draiage. Pressure dressing was applied using ABD's.  Labs were being drawn and Dr. Hilma Favors was updated.  04:30- All dressings were reinforced and bleeding was moderate at this time. V/S stable for patient.

## 2014-02-22 NOTE — Progress Notes (Signed)
Dressing changed with plastics MD at bedside. Negative pressure black foam removed from L lower thigh dehisced site. Plastic MD packed wound with kerlex. Surgeon scheduling pt for OR tomorrow. No new orders at this time.

## 2014-02-22 NOTE — Clinical Documentation Improvement (Signed)
Possible Clinical conditions  Morbid Obesity W/ BMI=54.4   Other condition___________________  Cannot clinically determine _____________  Risk Factors: OHS, DM,    Thank You, Philippa Chester ,RN Clinical Documentation Specialist:  [place] [employer] [facility]

## 2014-02-22 NOTE — Progress Notes (Signed)
Clinical Social Work Department BRIEF PSYCHOSOCIAL ASSESSMENT 02/22/2014  Patient:  Kelly [name], Kelly [name]     Account Number:  [id number]     Admit date:  02/20/2014  Clinical Social Worker:  Ulyess Blossom  Date/Time:  02/22/2014 04:00 PM  Referred by:  Physician  Date Referred:  02/22/2014 Referred for  SNF Placement   Other Referral:   Interview type:  Patient Other interview type:    PSYCHOSOCIAL DATA Living Status:  FACILITY Admitted from facility:  [facility] Level of care:  [place] Primary support name:  Juanda Crumble Freeman/nephew/[phone] Primary support relationship to patient:  FAMILY Degree of support available:   pt reports strong support    CURRENT CONCERNS Current Concerns  Post-Acute Placement   Other Concerns:    SOCIAL WORK ASSESSMENT / PLAN CSW received referral that pt admitted from [facility].    CSW met with pt at bedside. CSW introduced self and explained role. Pt confirmed that she is a resident at [facility]. Pt shared that she was only at [facility] a few days before having to come to the hospital. CSW recognized that pt not yet ready for discharge medically, but inquired if pt plans to return to [facility] once stable from a medical stand point and pt states that she does plan to return.    Per RN, pt may transfer to [facility] as pt was recently at [facility] for surgery.    CSW completed FL2 and sent clinicals to [facility].    CSW to continue to follow to provide support and assist with disposition planning.   Assessment/plan status:  Psychosocial Support/Ongoing Assessment of Needs Other assessment/ plan:   discharge planning   Information/referral to community resources:   Referral back to [facility]    PATIENT'S/FAMILY'S RESPONSE TO PLAN OF CARE: Pt alert and oriented x 4. Pt cooperative and pleasant. Pt shared she has supportive family locally and pt family had just left from a visit  with her at the hospital.    Alison Murray, MSW, [place] Work [phone]

## 2014-02-22 NOTE — Progress Notes (Signed)
INITIAL NUTRITION ASSESSMENT  DOCUMENTATION CODES Per approved criteria  -Morbid Obesity   INTERVENTION: - Glucerna shakes TID - Encouraged intake of protein rich foods to help with thigh wound - RD to continue to monitor   NUTRITION DIAGNOSIS: Increased nutrient needs related to thigh wound as evidenced by MD notes.   Goal: Pt to consume >90% of meals/supplements  Monitor:  Weights, labs, intake  Reason for Assessment: Malnutrition screening tool   68 y.o. female  Admitting Dx: Sepsis associated hypotension  ASSESSMENT: Pt with history of chronic diastolic heart failure, hypertension, hyperlipidemia, diabetes mellitus type 2, structures sleep apnea, GERD, depression, and chronic lymphedema, indwelling Foley catheter who presents with fevers and fatigue. The patient was admitted to [facility] from 6/15-6/25 for routine debridement of a chronic infected area on her left inner thigh by plastic surgery. Found to have severe sepsis with anemia from acute blood loss from wound.   - Pt alone in room - Reports poor appetite with pt consuming 1-2 meals/day at home - States she weighed 340 pounds 1-2 months ago, now weighs 326 pounds. Pt thinks it may have been fluid related weight loss. - Reports she follows a low sodium diet at home - Pt falling asleep during visit - States she has been eating about 50% of her meals   Alk phos elevated   Height: Ht Readings from Last 1 Encounters:  02/21/14 5' 5"  (1.651 m)    Weight: Wt Readings from Last 1 Encounters:  02/22/14 326 lb (147.873 kg)    Ideal Body Weight: 125 lbs   % Ideal Body Weight: 261%  Wt Readings from Last 10 Encounters:  02/22/14 326 lb (147.873 kg)  09/18/13 334 lb (151.501 kg)  05/01/13 326 lb 4.5 oz (148 kg)  01/05/13 280 lb 1.9 oz (127.062 kg)  11/25/12 370 lb 1.9 oz (167.885 kg)  11/20/12 367 lb 15.2 oz (166.9 kg)  09/23/12 379 lb (171.913 kg)  09/23/12 379 lb (171.913 kg)  08/12/12  376 lb (170.552 kg)  08/04/12 367 lb 12.8 oz (166.833 kg)    Usual Body Weight: 340 lbs per pt  % Usual Body Weight: 96%  BMI:  Body mass index is 54.25 kg/(m^2).  Estimated Nutritional Needs: Kcal: 1700-1900 Protein: 80-100g Fluid: per MD  Skin: +1 RLE, LLE edema, infected left inner thigh wound   Diet Order: Carb Control  EDUCATION NEEDS: -No education needs identified at this time   Intake/Output Summary (Last 24 hours) at 02/22/14 1646 Last data filed at 02/22/14 1606  Gross per 24 hour  Intake 1935.63 ml  Output    875 ml  Net 1060.63 ml    Last BM: 1 week ago per pt  Labs:   Recent Labs Lab 02/20/14 2335 02/21/14 0530 02/22/14 0200  NA 135* 136* 134*  K 5.3 5.5* 5.3  CL 102 104 103  CO2 20 19 18*  BUN 24* 26* 31*  CREATININE 1.42* 1.62* 1.60*  CALCIUM 8.1* 8.0* 7.6*  GLUCOSE 174* 171* 181*    CBG (last 3)   Recent Labs  02/21/14 1712 02/21/14 2241 02/22/14 0742  GLUCAP 167* 211* 159*    Scheduled Meds: . allopurinol  100 mg Oral Daily  . aspirin  325 mg Oral Daily  . atorvastatin  40 mg Oral q1800  . ceFEPime (MAXIPIME) IV  2 g Intravenous Q12H  . gabapentin  400 mg Oral BID  . insulin aspart  0-5 Units Subcutaneous QHS  . insulin aspart  0-9 Units Subcutaneous TID WC  . pantoprazole  80 mg Oral Q1200  . pneumococcal 23 valent vaccine  0.5 mL Intramuscular Tomorrow-1000  . polyethylene glycol  17 g Oral Daily  . sodium chloride  3 mL Intravenous Q12H  . vancomycin  1,750 mg Intravenous Q24H    Continuous Infusions: . heparin      Past Medical History  Diagnosis Date  . Hypertension   . Diabetes mellitus   . Hyperlipidemia   . CHF (congestive heart failure)   . GERD (gastroesophageal reflux disease)   . Depression   . Sleep apnea     doesn't use cpap machine or 02 at night  . Headache(784.0)     Migraines  . Arthritis     knees  . Anemia     Had blood transfusion x 2 in yhe 90s  . Lymphedema of leg   . Pulmonary  embolism 02/18/14    diagnosed at [facility] with CT angio chest  . Chronic indwelling Foley catheter     Past Surgical History  Procedure Laterality Date  . Rotator cuff repair      right  . Knee arthroscopy      right  . Temple biopsy    . Colonoscopy with propofol  09/23/2012    Procedure: COLONOSCOPY WITH PROPOFOL;  Surgeon: Jerene Bears, MD;  Location: [facility];  Service: Gastroenterology;  Laterality: N/A;  . Paniculectomy with wound debridement Left 02/08/2014    left medial thigh    Carlis Stable MS, RD, LDN [phone] Pager [phone] Weekend/After Hours Pager

## 2014-02-22 NOTE — Progress Notes (Signed)
[place] for Heparin Indication: Pulmonary embolus  Allergies  Allergen Reactions  . Penicillins Hives    Patient Measurements: Height: 5\' 5"  (165.1 cm) Weight: 326 lb (147.873 kg) IBW/kg (Calculated) : 57   Vital Signs: Temp: 99.9 F (37.7 C) (06/29 0800) Temp src: Oral (06/29 0800) BP: 120/39 mmHg (06/29 0616) Pulse Rate: 95 (06/29 0600)  Labs:  Recent Labs  02/20/14 2335 02/21/14 0530 02/21/14 1616 02/22/14 0200 02/22/14 1030  HGB 9.0* 8.3* 6.4* 8.2* 6.4*  HCT 28.0* 25.9* 20.2* 24.1* 19.5*  PLT 370 328 271 287 PENDING  LABPROT 16.4* 17.3*  --  18.8* 17.1*  INR 1.32 1.41  --  1.57* 1.39  CREATININE 1.42* 1.62*  --  1.60*  --     Estimated Creatinine Clearance: 51 ml/min (by C-G formula based on Cr of 1.6).   Medical History: Past Medical History  Diagnosis Date  . Hypertension   . Diabetes mellitus   . Hyperlipidemia   . CHF (congestive heart failure)   . GERD (gastroesophageal reflux disease)   . Depression   . Sleep apnea     doesn't use cpap machine or 02 at night  . Headache(784.0)     Migraines  . Arthritis     knees  . Anemia     Had blood transfusion x 2 in yhe 90s  . Lymphedema of leg   . Pulmonary embolism 02/18/14    diagnosed at [facility] with CT angio chest  . Chronic indwelling Foley catheter     Medications:  Scheduled:  . allopurinol  100 mg Oral Daily  . aspirin  325 mg Oral Daily  . atorvastatin  40 mg Oral q1800  . ceFEPime (MAXIPIME) IV  2 g Intravenous Q12H  . gabapentin  400 mg Oral BID  . insulin aspart  0-5 Units Subcutaneous QHS  . insulin aspart  0-9 Units Subcutaneous TID WC  . pantoprazole  80 mg Oral Q1200  . pneumococcal 23 valent vaccine  0.5 mL Intramuscular Tomorrow-1000  . polyethylene glycol  17 g Oral Daily  . sodium chloride  3 mL Intravenous Q12H  . vancomycin  1,750 mg Intravenous Q24H   Infusions:  . heparin     PRN: acetaminophen, acetaminophen, alum & mag  hydroxide-simeth, bisacodyl, morphine injection, ondansetron (ZOFRAN) IV, ondansetron, oxyCODONE, polyethylene glycol, promethazine, zolpidem  Assessment: 68 y/o F with pulmonary embolism diagnosed earlier this month at [facility], was discharged on full-dose Lovenox 160 mg SQ q12h with transition to warfarin in progress.  Patient presented to [facility] ED on 6/28 with T 103.8 and fatigue, was admitted with probable pyelonephritis.  PMH is complex but includes chronic infected area on inner thigh that was recently debrided.    Lovenox was converted to Heparin IV to provide a more readily reversible anticoagulant in case interruption should be required for a procedure or for bleeding.   On 6/28 heparin was started at 1500 units/hr with no bolus.  Less than four hours later the patient began bleeding heavily from upper thigh wound with Hgb drop to 6.4 requiring discontinuation of heparin and PRBC transfusions.  Today Dr. Wendee Beavers consulted hematology and was advised to try resuming heparin but at a lower dosage.  Patient is reportedly not an ideal candidate for IVC filter placement at present due to comorbidities.   Goal of Therapy:  Heparin level approximately 0.3 units/mL (= lower end of therapeutic range) if tolerated. Monitor platelets by anticoagulation protocol: Yes   Plan:  1. Begin heparin infusion at 1000 units/hr (NO BOLUS) 2. Check heparin level at 6pm, then every AM.    3. CBC daily 4. Monitor for signs and symptoms of recurrent bleeding.   Clayburn Pert, PharmD, BCPS Pager: [phone] 02/22/2014  11:25 AM

## 2014-02-22 NOTE — Consult Note (Signed)
Reason for Consult:left leg wound Referring Physician: Primary Service  Kelly [name] is an 68 y.o. female.  HPI: The patient is a 68 yrs old bf here for evaluation of her left leg, anemia, and diabetes.  She underwent a left leg tissue resection (panniculectomy) one month ago at [facility].  She was not closed completely as the plan was for partial secondary intention healing.  She was followed by medicine and then sent to a nursing home for further rehabilitation.  She had a VAC in place.  During one of the VAC changes she had some bleeding.  Due to a PE, she was anticoagulated which contributed to the bleeding.  She is now at [facility] and the wound on the left upper thigh is being packed with wet to dry dressings.  She has some clot in the area and most of the wound has opened. It does not look like it is infected.      Past Medical History  Diagnosis Date  . Hypertension   . Diabetes mellitus   . Hyperlipidemia   . CHF (congestive heart failure)   . GERD (gastroesophageal reflux disease)   . Depression   . Sleep apnea     doesn't use cpap machine or 02 at night  . Headache(784.0)     Migraines  . Arthritis     knees  . Anemia     Had blood transfusion x 2 in yhe 90s  . Lymphedema of leg   . Pulmonary embolism 02/18/14    diagnosed at [facility] with CT angio chest  . Chronic indwelling Foley catheter     Past Surgical History  Procedure Laterality Date  . Rotator cuff repair      right  . Knee arthroscopy      right  . Temple biopsy    . Colonoscopy with propofol  09/23/2012    Procedure: COLONOSCOPY WITH PROPOFOL;  Surgeon: Jerene Bears, MD;  Location: [facility];  Service: Gastroenterology;  Laterality: N/A;  . Paniculectomy with wound debridement Left 02/08/2014    left medial thigh    Family History  Problem Relation Age of Onset  . Emphysema Father   . Asthma Brother   . Heart disease Mother   . Stomach cancer Brother   . Heart disease Maternal Grandmother   . Diabetes Mother     . Diabetes Sister     Social History:  reports that she quit smoking about 32 years ago. Her smoking use included Cigarettes. She smoked 0.00 packs per day. She has never used smokeless tobacco. She reports that she does not drink alcohol or use illicit drugs.  Allergies:  Allergies  Allergen Reactions  . Penicillins Hives    Medications: I have reviewed the patient's current medications.  Results for orders placed during the hospital encounter of 02/20/14 (from the past 48 hour(s))  CULTURE, BLOOD (ROUTINE X 2)     Status: None   Collection Time    02/20/14 11:06 PM      Result Value Ref Range   Specimen Description BLOOD LEFT HAND     Special Requests BOTTLES DRAWN AEROBIC AND ANAEROBIC 3CC     Culture  Setup Time       Value: 02/21/2014 16:18     Performed at [facility]   Culture       Value:        BLOOD CULTURE RECEIVED NO GROWTH TO DATE CULTURE WILL BE HELD FOR 5 DAYS BEFORE ISSUING  A FINAL NEGATIVE REPORT     Performed at [facility]   Report Status PENDING    CBC WITH DIFFERENTIAL     Status: Abnormal   Collection Time    02/20/14 11:35 PM      Result Value Ref Range   WBC 13.3 (*) 4.0 - 10.5 K/uL   RBC 3.12 (*) 3.87 - 5.11 MIL/uL   Hemoglobin 9.0 (*) 12.0 - 15.0 g/dL   HCT 28.0 (*) 36.0 - 46.0 %   MCV 89.7  78.0 - 100.0 fL   MCH 28.8  26.0 - 34.0 pg   MCHC 32.1  30.0 - 36.0 g/dL   RDW 16.6 (*) 11.5 - 15.5 %   Platelets 370  150 - 400 K/uL   Neutrophils Relative % 91 (*) 43 - 77 %   Neutro Abs 12.2 (*) 1.7 - 7.7 K/uL   Lymphocytes Relative 4 (*) 12 - 46 %   Lymphs Abs 0.5 (*) 0.7 - 4.0 K/uL   Monocytes Relative 4  3 - 12 %   Monocytes Absolute 0.5  0.1 - 1.0 K/uL   Eosinophils Relative 1  0 - 5 %   Eosinophils Absolute 0.1  0.0 - 0.7 K/uL   Basophils Relative 0  0 - 1 %   Basophils Absolute 0.0  0.0 - 0.1 K/uL  COMPREHENSIVE METABOLIC PANEL     Status: Abnormal   Collection Time    02/20/14 11:35 PM      Result Value Ref Range    Sodium 135 (*) 137 - 147 mEq/L   Potassium 5.3  3.7 - 5.3 mEq/L   Chloride 102  96 - 112 mEq/L   CO2 20  19 - 32 mEq/L   Glucose, Bld 174 (*) 70 - 99 mg/dL   BUN 24 (*) 6 - 23 mg/dL   Creatinine, Ser 1.42 (*) 0.50 - 1.10 mg/dL   Calcium 8.1 (*) 8.4 - 10.5 mg/dL   Total Protein 7.0  6.0 - 8.3 g/dL   Albumin 1.9 (*) 3.5 - 5.2 g/dL   AST 23  0 - 37 U/L   ALT 25  0 - 35 U/L   Alkaline Phosphatase 153 (*) 39 - 117 U/L   Total Bilirubin 0.2 (*) 0.3 - 1.2 mg/dL   GFR calc non Af Amer 38 (*) >90 mL/min   GFR calc Af Amer 44 (*) >90 mL/min   Comment: (NOTE)     The eGFR has been calculated using the CKD EPI equation.     This calculation has not been validated in all clinical situations.     eGFR's persistently <90 mL/min signify possible Chronic Kidney     Disease.  CULTURE, BLOOD (ROUTINE X 2)     Status: None   Collection Time    02/20/14 11:35 PM      Result Value Ref Range   Specimen Description BLOOD LEFT ANTECUBITAL     Special Requests BOTTLES DRAWN AEROBIC AND ANAEROBIC 5CC     Culture  Setup Time       Value: 02/21/2014 16:18     Performed at [facility]   Culture       Value:        BLOOD CULTURE RECEIVED NO GROWTH TO DATE CULTURE WILL BE HELD FOR 5 DAYS BEFORE ISSUING A FINAL NEGATIVE REPORT     Performed at [facility]   Report Status PENDING    PROTIME-INR     Status: Abnormal  Collection Time    02/20/14 11:35 PM      Result Value Ref Range   Prothrombin Time 16.4 (*) 11.6 - 15.2 seconds   INR 1.32  0.00 - 1.49  I-STAT CG4 LACTIC ACID, ED     Status: None   Collection Time    02/20/14 11:46 PM      Result Value Ref Range   Lactic Acid, Venous 1.30  0.5 - 2.2 mmol/L  URINALYSIS, ROUTINE W REFLEX MICROSCOPIC     Status: Abnormal   Collection Time    02/21/14  3:21 AM      Result Value Ref Range   Color, Urine YELLOW  YELLOW   APPearance CLOUDY (*) CLEAR   Specific Gravity, Urine 1.014  1.005 - 1.030   pH 5.5  5.0 - 8.0   Glucose, UA NEGATIVE   NEGATIVE mg/dL   Hgb urine dipstick MODERATE (*) NEGATIVE   Bilirubin Urine NEGATIVE  NEGATIVE   Ketones, ur NEGATIVE  NEGATIVE mg/dL   Protein, ur 30 (*) NEGATIVE mg/dL   Urobilinogen, UA 0.2  0.0 - 1.0 mg/dL   Nitrite NEGATIVE  NEGATIVE   Leukocytes, UA SMALL (*) NEGATIVE  URINE CULTURE     Status: None   Collection Time    02/21/14  3:21 AM      Result Value Ref Range   Specimen Description URINE, RANDOM     Special Requests NONE     Culture  Setup Time       Value: 02/21/2014 16:29     Performed at [facility]       Value: >=100,000 COLONIES/ML     Performed at [facility]   Culture       Value: ESCHERICHIA COLI     Performed at [facility]   Report Status PENDING    URINE MICROSCOPIC-ADD ON     Status: Abnormal   Collection Time    02/21/14  3:21 AM      Result Value Ref Range   WBC, UA 11-20  <3 WBC/hpf   RBC / HPF 3-6  <3 RBC/hpf   Bacteria, UA MANY (*) RARE  BASIC METABOLIC PANEL     Status: Abnormal   Collection Time    02/21/14  5:30 AM      Result Value Ref Range   Sodium 136 (*) 137 - 147 mEq/L   Potassium 5.5 (*) 3.7 - 5.3 mEq/L   Chloride 104  96 - 112 mEq/L   CO2 19  19 - 32 mEq/L   Glucose, Bld 171 (*) 70 - 99 mg/dL   BUN 26 (*) 6 - 23 mg/dL   Creatinine, Ser 1.62 (*) 0.50 - 1.10 mg/dL   Calcium 8.0 (*) 8.4 - 10.5 mg/dL   GFR calc non Af Amer 32 (*) >90 mL/min   GFR calc Af Amer 37 (*) >90 mL/min   Comment: (NOTE)     The eGFR has been calculated using the CKD EPI equation.     This calculation has not been validated in all clinical situations.     eGFR's persistently <90 mL/min signify possible Chronic Kidney     Disease.  CBC     Status: Abnormal   Collection Time    02/21/14  5:30 AM      Result Value Ref Range   WBC 17.7 (*) 4.0 - 10.5 K/uL   RBC 2.87 (*) 3.87 - 5.11 MIL/uL   Hemoglobin 8.3 (*)  12.0 - 15.0 g/dL   HCT 25.9 (*) 36.0 - 46.0 %   MCV 90.2  78.0 - 100.0 fL   MCH 28.9  26.0 - 34.0 pg    MCHC 32.0  30.0 - 36.0 g/dL   RDW 16.5 (*) 11.5 - 15.5 %   Platelets 328  150 - 400 K/uL  PROTIME-INR     Status: Abnormal   Collection Time    02/21/14  5:30 AM      Result Value Ref Range   Prothrombin Time 17.3 (*) 11.6 - 15.2 seconds   INR 1.41  0.00 - 1.49  MRSA PCR SCREENING     Status: None   Collection Time    02/21/14  5:37 AM      Result Value Ref Range   MRSA by PCR NEGATIVE  NEGATIVE   Comment:            The GeneXpert MRSA Assay (FDA     approved for NASAL specimens     only), is one component of a     comprehensive MRSA colonization     surveillance program. It is not     intended to diagnose MRSA     infection nor to guide or     monitor treatment for     MRSA infections.  GLUCOSE, CAPILLARY     Status: Abnormal   Collection Time    02/21/14  7:55 AM      Result Value Ref Range   Glucose-Capillary 166 (*) 70 - 99 mg/dL  GLUCOSE, CAPILLARY     Status: Abnormal   Collection Time    02/21/14 12:22 PM      Result Value Ref Range   Glucose-Capillary 176 (*) 70 - 99 mg/dL  CBC     Status: Abnormal   Collection Time    02/21/14  4:16 PM      Result Value Ref Range   WBC 12.9 (*) 4.0 - 10.5 K/uL   RBC 2.22 (*) 3.87 - 5.11 MIL/uL   Hemoglobin 6.4 (*) 12.0 - 15.0 g/dL   Comment: REPEATED TO VERIFY     DELTA CHECK NOTED     CRITICAL RESULT CALLED TO, READ BACK BY AND VERIFIED WITH:     J DEUTSCH AT 1637 ON 06.28.2015 BY NBROOKS   HCT 20.2 (*) 36.0 - 46.0 %   MCV 91.0  78.0 - 100.0 fL   MCH 28.4  26.0 - 34.0 pg   MCHC 31.2  30.0 - 36.0 g/dL   RDW 16.6 (*) 11.5 - 15.5 %   Platelets 271  150 - 400 K/uL  GLUCOSE, CAPILLARY     Status: Abnormal   Collection Time    02/21/14  5:12 PM      Result Value Ref Range   Glucose-Capillary 167 (*) 70 - 99 mg/dL  TYPE AND SCREEN     Status: None   Collection Time    02/21/14  5:35 PM      Result Value Ref Range   ABO/RH(D) O POS     Antibody Screen NEG     Sample Expiration 02/24/2014     Unit Number [id number]      Blood Component Type RBC LR PHER2     Unit division 00     Status of Unit ISSUED,FINAL     Transfusion Status OK TO TRANSFUSE     Crossmatch Result Compatible     Unit Number [id number]     Blood Component Type RED  CELLS,LR     Unit division 00     Status of Unit ISSUED,FINAL     Transfusion Status OK TO TRANSFUSE     Crossmatch Result Compatible     Unit Number [id number]     Blood Component Type RBC LR PHER2     Unit division 00     Status of Unit ISSUED     Transfusion Status OK TO TRANSFUSE     Crossmatch Result Compatible     Unit Number [id number]     Blood Component Type RED CELLS,LR     Unit division 00     Status of Unit ISSUED     Transfusion Status OK TO TRANSFUSE     Crossmatch Result Compatible     Unit Number [id number]     Blood Component Type RED CELLS,LR     Unit division 00     Status of Unit ALLOCATED     Transfusion Status OK TO TRANSFUSE     Crossmatch Result Compatible    ABO/RH     Status: None   Collection Time    02/21/14  5:35 PM      Result Value Ref Range   ABO/RH(D) O POS    PREPARE RBC (CROSSMATCH)     Status: None   Collection Time    02/21/14  5:42 PM      Result Value Ref Range   Order Confirmation ORDER PROCESSED BY BLOOD BANK    GLUCOSE, CAPILLARY     Status: Abnormal   Collection Time    02/21/14 10:41 PM      Result Value Ref Range   Glucose-Capillary 211 (*) 70 - 99 mg/dL  CBC     Status: Abnormal   Collection Time    02/22/14  2:00 AM      Result Value Ref Range   WBC 13.8 (*) 4.0 - 10.5 K/uL   RBC 2.75 (*) 3.87 - 5.11 MIL/uL   Hemoglobin 8.2 (*) 12.0 - 15.0 g/dL   Comment: DELTA CHECK NOTED     POST TRANSFUSION SPECIMEN   HCT 24.1 (*) 36.0 - 46.0 %   MCV 87.6  78.0 - 100.0 fL   MCH 29.8  26.0 - 34.0 pg   MCHC 34.0  30.0 - 36.0 g/dL   RDW 15.8 (*) 11.5 - 15.5 %   Platelets 287  150 - 400 K/uL  BASIC METABOLIC PANEL     Status: Abnormal   Collection Time    02/22/14  2:00 AM      Result Value Ref Range     Sodium 134 (*) 137 - 147 mEq/L   Potassium 5.3  3.7 - 5.3 mEq/L   Chloride 103  96 - 112 mEq/L   CO2 18 (*) 19 - 32 mEq/L   Glucose, Bld 181 (*) 70 - 99 mg/dL   BUN 31 (*) 6 - 23 mg/dL   Creatinine, Ser 1.60 (*) 0.50 - 1.10 mg/dL   Calcium 7.6 (*) 8.4 - 10.5 mg/dL   GFR calc non Af Amer 33 (*) >90 mL/min   GFR calc Af Amer 38 (*) >90 mL/min   Comment: (NOTE)     The eGFR has been calculated using the CKD EPI equation.     This calculation has not been validated in all clinical situations.     eGFR's persistently <90 mL/min signify possible Chronic Kidney     Disease.  HEMOGLOBIN A1C     Status: Abnormal   Collection Time  02/22/14  2:00 AM      Result Value Ref Range   Hemoglobin A1C 6.1 (*) <5.7 %   Comment: (NOTE)                                                                               According to the ADA Clinical Practice Recommendations for 2011, when     HbA1c is used as a screening test:      >=6.5%   Diagnostic of Diabetes Mellitus               (if abnormal result is confirmed)     5.7-6.4%   Increased risk of developing Diabetes Mellitus     References:Diagnosis and Classification of Diabetes Mellitus,Diabetes     TZGY,1749,44(HQPRF 1):S62-S69 and Standards of Medical Care in             Diabetes - 2011,Diabetes Care,2011,34 (Suppl 1):S11-S61.   Mean Plasma Glucose 128 (*) <117 mg/dL   Comment: Performed at [facility] [facility]     Status: Abnormal   Collection Time    02/22/14  2:00 AM      Result Value Ref Range   Prothrombin Time 18.8 (*) 11.6 - 15.2 seconds   INR 1.57 (*) 0.00 - 1.49  GLUCOSE, CAPILLARY     Status: Abnormal   Collection Time    02/22/14  7:42 AM      Result Value Ref Range   Glucose-Capillary 159 (*) 70 - 99 mg/dL   Comment 1 Documented in Chart     Comment 2 Notify RN    CBC     Status: Abnormal   Collection Time    02/22/14 10:30 AM      Result Value Ref Range   WBC 10.7 (*) 4.0 - 10.5 K/uL   Comment: WHITE  COUNT CONFIRMED ON SMEAR   RBC 2.27 (*) 3.87 - 5.11 MIL/uL   Hemoglobin 6.4 (*) 12.0 - 15.0 g/dL   Comment: QUANTITY NOT SUFFICIENT TO REPEAT TEST     CRITICAL RESULT CALLED TO, READ BACK BY AND VERIFIED WITH:     STROUPE,K RN AT 1112 06.29.2015 BY LINEBERRY,R   HCT 19.5 (*) 36.0 - 46.0 %   MCV 85.9  78.0 - 100.0 fL   MCH 28.2  26.0 - 34.0 pg   MCHC 32.8  30.0 - 36.0 g/dL   RDW 16.1 (*) 11.5 - 15.5 %   Platelets    150 - 400 K/uL   Value: PLATELET CLUMPS NOTED ON SMEAR, COUNT APPEARS ADEQUATE   Comment: SPECIMEN CHECKED FOR CLOTS  PROTIME-INR     Status: Abnormal   Collection Time    02/22/14 10:30 AM      Result Value Ref Range   Prothrombin Time 17.1 (*) 11.6 - 15.2 seconds   INR 1.39  0.00 - 1.49  PREPARE RBC (CROSSMATCH)     Status: None   Collection Time    02/22/14 11:30 AM      Result Value Ref Range   Order Confirmation ORDER PROCESSED BY BLOOD BANK    GLUCOSE, CAPILLARY     Status: Abnormal   Collection Time    02/22/14 12:34 PM  Result Value Ref Range   Glucose-Capillary 165 (*) 70 - 99 mg/dL   Comment 1 Documented in Chart     Comment 2 Notify RN    GLUCOSE, CAPILLARY     Status: Abnormal   Collection Time    02/22/14  5:02 PM      Result Value Ref Range   Glucose-Capillary 137 (*) 70 - 99 mg/dL   Comment 1 Documented in Chart     Comment 2 Notify RN      Dg Chest Port 1 View  02/20/2014   CLINICAL DATA:  Fever.  Weakness.  EXAM: PORTABLE CHEST - 1 VIEW  COMPARISON:  11/17/2012  FINDINGS: No cardiomegaly for technique. Unchanged prominence of the pulmonary vasculature. No edema, consolidation, effusion, or pneumothorax. No acute osseous findings. Degenerative thoracic endplate spurring.  IMPRESSION: No definitive pneumonia.   Electronically Signed   By: Jorje Guild M.D.   On: 02/20/2014 23:51    Review of Systems  HENT: Negative.   Eyes: Negative.   Respiratory: Negative.   Cardiovascular: Negative.   Gastrointestinal: Negative.   Genitourinary:  Negative.   Musculoskeletal: Positive for joint pain.  Neurological: Positive for weakness.  Psychiatric/Behavioral: Negative.    Blood pressure 103/33, pulse 80, temperature 98.5 F (36.9 C), temperature source Axillary, resp. rate 17, height 5' 5"  (1.651 m), weight 147.873 kg (326 lb), SpO2 96.00%. Physical Exam  Constitutional: She is oriented to person, place, and time. She appears well-developed and well-nourished.  HENT:  Head: Normocephalic and atraumatic.  Eyes: Conjunctivae and EOM are normal. Pupils are equal, round, and reactive to light.  Cardiovascular: Normal rate.   Respiratory: Effort normal.  Musculoskeletal:       Legs: Neurological: She is alert and oriented to person, place, and time.  Psychiatric: She has a normal mood and affect. Her behavior is normal.    Assessment/Plan: Plan for OR tomorrow for evacuation of hematoma and possible ACell and VAC placement.  SANGER,Teylor Wolven 02/22/2014, 6:07 PM

## 2014-02-23 ENCOUNTER — Inpatient Hospital Stay (HOSPITAL_COMMUNITY): Payer: Medicare Other

## 2014-02-23 LAB — TYPE AND SCREEN
ABO/RH(D): O POS
Antibody Screen: NEGATIVE
UNIT DIVISION: 0
UNIT DIVISION: 0
Unit division: 0
Unit division: 0
Unit division: 0

## 2014-02-23 LAB — CBC
HEMATOCRIT: 19.5 % — AB (ref 36.0–46.0)
HEMATOCRIT: 26.6 % — AB (ref 36.0–46.0)
Hemoglobin: 6.4 g/dL — CL (ref 12.0–15.0)
Hemoglobin: 8.9 g/dL — ABNORMAL LOW (ref 12.0–15.0)
MCH: 28.2 pg (ref 26.0–34.0)
MCH: 29 pg (ref 26.0–34.0)
MCHC: 32.8 g/dL (ref 30.0–36.0)
MCHC: 33.5 g/dL (ref 30.0–36.0)
MCV: 85.9 fL (ref 78.0–100.0)
MCV: 86.6 fL (ref 78.0–100.0)
PLATELETS: 224 10*3/uL (ref 150–400)
Platelets: ADEQUATE 10*3/uL (ref 150–400)
RBC: 2.27 MIL/uL — AB (ref 3.87–5.11)
RBC: 3.07 MIL/uL — AB (ref 3.87–5.11)
RDW: 16.1 % — ABNORMAL HIGH (ref 11.5–15.5)
RDW: 15.3 % (ref 11.5–15.5)
WBC: 10.7 10*3/uL — AB (ref 4.0–10.5)
WBC: 11.6 10*3/uL — AB (ref 4.0–10.5)

## 2014-02-23 LAB — BASIC METABOLIC PANEL
BUN: 35 mg/dL — ABNORMAL HIGH (ref 6–23)
CHLORIDE: 106 meq/L (ref 96–112)
CO2: 20 meq/L (ref 19–32)
Calcium: 7.3 mg/dL — ABNORMAL LOW (ref 8.4–10.5)
Creatinine, Ser: 1.56 mg/dL — ABNORMAL HIGH (ref 0.50–1.10)
GFR calc Af Amer: 39 mL/min — ABNORMAL LOW (ref >90)
GFR calc non Af Amer: 34 mL/min — ABNORMAL LOW (ref >90)
Glucose, Bld: 194 mg/dL — ABNORMAL HIGH (ref 70–99)
Potassium: 5.4 mEq/L — ABNORMAL HIGH (ref 3.7–5.3)
SODIUM: 135 meq/L — AB (ref 137–147)

## 2014-02-23 LAB — GLUCOSE, CAPILLARY
GLUCOSE-CAPILLARY: 98 mg/dL (ref 70–99)
Glucose-Capillary: 129 mg/dL — ABNORMAL HIGH (ref 70–99)
Glucose-Capillary: 171 mg/dL — ABNORMAL HIGH (ref 70–99)

## 2014-02-23 MED ORDER — FENTANYL CITRATE 0.05 MG/ML IJ SOLN
INTRAMUSCULAR | Status: AC | PRN
  Administered 2014-02-23: 100 ug via INTRAVENOUS

## 2014-02-23 MED ORDER — FENTANYL CITRATE 0.05 MG/ML IJ SOLN
INTRAMUSCULAR | Status: AC
  Filled 2014-02-23: qty 2

## 2014-02-23 MED ORDER — MIDAZOLAM HCL 2 MG/2ML IJ SOLN
INTRAMUSCULAR | Status: AC | PRN
  Administered 2014-02-23: 2 mg via INTRAVENOUS

## 2014-02-23 MED ORDER — PNEUMOCOCCAL VAC POLYVALENT 25 MCG/0.5ML IJ INJ
0.5000 mL | INJECTION | INTRAMUSCULAR | Status: DC
  Filled 2014-02-23 (×2): qty 0.5

## 2014-02-23 MED ORDER — LIDOCAINE HCL 1 % IJ SOLN
INTRAMUSCULAR | Status: AC
  Filled 2014-02-23: qty 20

## 2014-02-23 MED ORDER — MIDAZOLAM HCL 2 MG/2ML IJ SOLN
INTRAMUSCULAR | Status: AC
  Filled 2014-02-23: qty 2

## 2014-02-23 NOTE — Progress Notes (Signed)
Agree.  Clinical indication for IVC filter placement.  Will proceed later today.

## 2014-02-23 NOTE — Progress Notes (Signed)
Patient ID: Kelly [name], female   DOB: 07-04-1946, 68 y.o.   MRN: [id number] Request received for IVC filter placement in pt with recent history of left lower extremity debridement of wound as well as panniculectomy one month ago at [facility] by Dr. Luetta Nutting (plastic surgery). Postoperatively developed PE and was placed on anticoagulation. Was also discharged with Foley catheter in place to avoid urine getting on the left lower extremity wound. Patient developed sepsis secondary to urinary tract source. Was initially placed on IV antibiotics and her hospital course has been complicated by left lower extremity bleeding as patient required anticoagulation to protect her from pulmonary embolism. As a result of continued left lower extremity bleeding patient has required multiple transfusions and currently is awaiting revision in OR today 02/23/2014 of which no further anticoagulation is recommended by surgeon. Additional PMH as below. Exam: pt awake/alert; chest- CTA bilat ant; heart- RRR; abd- obese,soft,+BS,NT; ext- left upper thigh wound covered with dressing.   Filed Vitals:   02/23/14 0300 02/23/14 0400 02/23/14 0600 02/23/14 0800  BP: 111/42 127/45 128/41   Pulse: 94 87 89   Temp:  99.3 F (37.4 C)  100.8 F (38.2 C)  TempSrc:  Oral  Oral  Resp: 21 20 20    Height:      Weight:      SpO2: 100% 100% 100%    Past Medical History  Diagnosis Date  . Hypertension   . Diabetes mellitus   . Hyperlipidemia   . CHF (congestive heart failure)   . GERD (gastroesophageal reflux disease)   . Depression   . Sleep apnea     doesn't use cpap machine or 02 at night  . Headache(784.0)     Migraines  . Arthritis     knees  . Anemia     Had blood transfusion x 2 in yhe 90s  . Lymphedema of leg   . Pulmonary embolism 02/18/14    diagnosed at [facility] with CT angio chest  . Chronic indwelling Foley catheter    Past Surgical History  Procedure Laterality Date  . Rotator cuff repair      right  . Knee  arthroscopy      right  . Temple biopsy    . Colonoscopy with propofol  09/23/2012    Procedure: COLONOSCOPY WITH PROPOFOL;  Surgeon: Jerene Bears, MD;  Location: [facility];  Service: Gastroenterology;  Laterality: N/A;  . Paniculectomy with wound debridement Left 02/08/2014    left medial thigh   Dg Chest Port 1 View  02/20/2014   CLINICAL DATA:  Fever.  Weakness.  EXAM: PORTABLE CHEST - 1 VIEW  COMPARISON:  11/17/2012  FINDINGS: No cardiomegaly for technique. Unchanged prominence of the pulmonary vasculature. No edema, consolidation, effusion, or pneumothorax. No acute osseous findings. Degenerative thoracic endplate spurring.  IMPRESSION: No definitive pneumonia.   Electronically Signed   By: Jorje Guild M.D.   On: 02/20/2014 23:51  Results for orders placed during the hospital encounter of 02/20/14  CULTURE, BLOOD (ROUTINE X 2)      Result Value Ref Range   Specimen Description BLOOD LEFT HAND     Special Requests BOTTLES DRAWN AEROBIC AND ANAEROBIC 3CC     Culture  Setup Time       Value: 02/21/2014 16:18     Performed at [facility]   Culture       Value:        BLOOD CULTURE RECEIVED NO GROWTH TO DATE CULTURE WILL  BE HELD FOR 5 DAYS BEFORE ISSUING A FINAL NEGATIVE REPORT     Performed at [facility]   Report Status PENDING    CULTURE, BLOOD (ROUTINE X 2)      Result Value Ref Range   Specimen Description BLOOD LEFT ANTECUBITAL     Special Requests BOTTLES DRAWN AEROBIC AND ANAEROBIC 5CC     Culture  Setup Time       Value: 02/21/2014 16:18     Performed at [facility]   Culture       Value:        BLOOD CULTURE RECEIVED NO GROWTH TO DATE CULTURE WILL BE HELD FOR 5 DAYS BEFORE ISSUING A FINAL NEGATIVE REPORT     Performed at [facility]   Report Status PENDING    URINE CULTURE      Result Value Ref Range   Specimen Description URINE, RANDOM     Special Requests NONE     Culture  Setup Time       Value: 02/21/2014 16:29      Performed at [facility]       Value: >=100,000 COLONIES/ML     Performed at [facility]   Culture       Value: ESCHERICHIA COLI     Performed at [facility]   Report Status PENDING    MRSA PCR SCREENING      Result Value Ref Range   MRSA by PCR NEGATIVE  NEGATIVE  CBC WITH DIFFERENTIAL      Result Value Ref Range   WBC 13.3 (*) 4.0 - 10.5 K/uL   RBC 3.12 (*) 3.87 - 5.11 MIL/uL   Hemoglobin 9.0 (*) 12.0 - 15.0 g/dL   HCT 28.0 (*) 36.0 - 46.0 %   MCV 89.7  78.0 - 100.0 fL   MCH 28.8  26.0 - 34.0 pg   MCHC 32.1  30.0 - 36.0 g/dL   RDW 16.6 (*) 11.5 - 15.5 %   Platelets 370  150 - 400 K/uL   Neutrophils Relative % 91 (*) 43 - 77 %   Neutro Abs 12.2 (*) 1.7 - 7.7 K/uL   Lymphocytes Relative 4 (*) 12 - 46 %   Lymphs Abs 0.5 (*) 0.7 - 4.0 K/uL   Monocytes Relative 4  3 - 12 %   Monocytes Absolute 0.5  0.1 - 1.0 K/uL   Eosinophils Relative 1  0 - 5 %   Eosinophils Absolute 0.1  0.0 - 0.7 K/uL   Basophils Relative 0  0 - 1 %   Basophils Absolute 0.0  0.0 - 0.1 K/uL  COMPREHENSIVE METABOLIC PANEL      Result Value Ref Range   Sodium 135 (*) 137 - 147 mEq/L   Potassium 5.3  3.7 - 5.3 mEq/L   Chloride 102  96 - 112 mEq/L   CO2 20  19 - 32 mEq/L   Glucose, Bld 174 (*) 70 - 99 mg/dL   BUN 24 (*) 6 - 23 mg/dL   Creatinine, Ser 1.42 (*) 0.50 - 1.10 mg/dL   Calcium 8.1 (*) 8.4 - 10.5 mg/dL   Total Protein 7.0  6.0 - 8.3 g/dL   Albumin 1.9 (*) 3.5 - 5.2 g/dL   AST 23  0 - 37 U/L   ALT 25  0 - 35 U/L   Alkaline Phosphatase 153 (*) 39 - 117 U/L   Total Bilirubin 0.2 (*) 0.3 -  1.2 mg/dL   GFR calc non Af Amer 38 (*) >90 mL/min   GFR calc Af Amer 44 (*) >90 mL/min  URINALYSIS, ROUTINE W REFLEX MICROSCOPIC      Result Value Ref Range   Color, Urine YELLOW  YELLOW   APPearance CLOUDY (*) CLEAR   Specific Gravity, Urine 1.014  1.005 - 1.030   pH 5.5  5.0 - 8.0   Glucose, UA NEGATIVE  NEGATIVE mg/dL   Hgb urine dipstick MODERATE (*) NEGATIVE    Bilirubin Urine NEGATIVE  NEGATIVE   Ketones, ur NEGATIVE  NEGATIVE mg/dL   Protein, ur 30 (*) NEGATIVE mg/dL   Urobilinogen, UA 0.2  0.0 - 1.0 mg/dL   Nitrite NEGATIVE  NEGATIVE   Leukocytes, UA SMALL (*) NEGATIVE  PROTIME-INR      Result Value Ref Range   Prothrombin Time 16.4 (*) 11.6 - 15.2 seconds   INR 1.32  0.00 - 1.49  URINE MICROSCOPIC-ADD ON      Result Value Ref Range   WBC, UA 11-20  <3 WBC/hpf   RBC / HPF 3-6  <3 RBC/hpf   Bacteria, UA MANY (*) RARE  BASIC METABOLIC PANEL      Result Value Ref Range   Sodium 136 (*) 137 - 147 mEq/L   Potassium 5.5 (*) 3.7 - 5.3 mEq/L   Chloride 104  96 - 112 mEq/L   CO2 19  19 - 32 mEq/L   Glucose, Bld 171 (*) 70 - 99 mg/dL   BUN 26 (*) 6 - 23 mg/dL   Creatinine, Ser 1.62 (*) 0.50 - 1.10 mg/dL   Calcium 8.0 (*) 8.4 - 10.5 mg/dL   GFR calc non Af Amer 32 (*) >90 mL/min   GFR calc Af Amer 37 (*) >90 mL/min  CBC      Result Value Ref Range   WBC 17.7 (*) 4.0 - 10.5 K/uL   RBC 2.87 (*) 3.87 - 5.11 MIL/uL   Hemoglobin 8.3 (*) 12.0 - 15.0 g/dL   HCT 25.9 (*) 36.0 - 46.0 %   MCV 90.2  78.0 - 100.0 fL   MCH 28.9  26.0 - 34.0 pg   MCHC 32.0  30.0 - 36.0 g/dL   RDW 16.5 (*) 11.5 - 15.5 %   Platelets 328  150 - 400 K/uL  PROTIME-INR      Result Value Ref Range   Prothrombin Time 17.3 (*) 11.6 - 15.2 seconds   INR 1.41  0.00 - 1.49  GLUCOSE, CAPILLARY      Result Value Ref Range   Glucose-Capillary 166 (*) 70 - 99 mg/dL  GLUCOSE, CAPILLARY      Result Value Ref Range   Glucose-Capillary 176 (*) 70 - 99 mg/dL  CBC      Result Value Ref Range   WBC 13.8 (*) 4.0 - 10.5 K/uL   RBC 2.75 (*) 3.87 - 5.11 MIL/uL   Hemoglobin 8.2 (*) 12.0 - 15.0 g/dL   HCT 24.1 (*) 36.0 - 46.0 %   MCV 87.6  78.0 - 100.0 fL   MCH 29.8  26.0 - 34.0 pg   MCHC 34.0  30.0 - 36.0 g/dL   RDW 15.8 (*) 11.5 - 15.5 %   Platelets 287  150 - 400 K/uL  CBC      Result Value Ref Range   WBC 12.9 (*) 4.0 - 10.5 K/uL   RBC 2.22 (*) 3.87 - 5.11 MIL/uL    Hemoglobin 6.4 (*) 12.0 - 15.0 g/dL  HCT 20.2 (*) 36.0 - 46.0 %   MCV 91.0  78.0 - 100.0 fL   MCH 28.4  26.0 - 34.0 pg   MCHC 31.2  30.0 - 36.0 g/dL   RDW 16.6 (*) 11.5 - 15.5 %   Platelets 271  150 - 400 K/uL  BASIC METABOLIC PANEL      Result Value Ref Range   Sodium 134 (*) 137 - 147 mEq/L   Potassium 5.3  3.7 - 5.3 mEq/L   Chloride 103  96 - 112 mEq/L   CO2 18 (*) 19 - 32 mEq/L   Glucose, Bld 181 (*) 70 - 99 mg/dL   BUN 31 (*) 6 - 23 mg/dL   Creatinine, Ser 1.60 (*) 0.50 - 1.10 mg/dL   Calcium 7.6 (*) 8.4 - 10.5 mg/dL   GFR calc non Af Amer 33 (*) >90 mL/min   GFR calc Af Amer 38 (*) >90 mL/min  HEMOGLOBIN A1C      Result Value Ref Range   Hemoglobin A1C 6.1 (*) <5.7 %   Mean Plasma Glucose 128 (*) <117 mg/dL  GLUCOSE, CAPILLARY      Result Value Ref Range   Glucose-Capillary 167 (*) 70 - 99 mg/dL  PROTIME-INR      Result Value Ref Range   Prothrombin Time 18.8 (*) 11.6 - 15.2 seconds   INR 1.57 (*) 0.00 - 1.49  GLUCOSE, CAPILLARY      Result Value Ref Range   Glucose-Capillary 211 (*) 70 - 99 mg/dL  GLUCOSE, CAPILLARY      Result Value Ref Range   Glucose-Capillary 159 (*) 70 - 99 mg/dL   Comment 1 Documented in Chart     Comment 2 Notify RN    CBC      Result Value Ref Range   WBC 10.7 (*) 4.0 - 10.5 K/uL   RBC 2.27 (*) 3.87 - 5.11 MIL/uL   Hemoglobin 6.4 (*) 12.0 - 15.0 g/dL   HCT 19.5 (*) 36.0 - 46.0 %   MCV 85.9  78.0 - 100.0 fL   MCH 28.2  26.0 - 34.0 pg   MCHC 32.8  30.0 - 36.0 g/dL   RDW 16.1 (*) 11.5 - 15.5 %   Platelets    150 - 400 K/uL   Value: PLATELET CLUMPS NOTED ON SMEAR, COUNT APPEARS ADEQUATE  PROTIME-INR      Result Value Ref Range   Prothrombin Time 17.1 (*) 11.6 - 15.2 seconds   INR 1.39  0.00 - 1.75  BASIC METABOLIC PANEL      Result Value Ref Range   Sodium 135 (*) 137 - 147 mEq/L   Potassium 5.4 (*) 3.7 - 5.3 mEq/L   Chloride 106  96 - 112 mEq/L   CO2 20  19 - 32 mEq/L   Glucose, Bld 194 (*) 70 - 99 mg/dL   BUN 35 (*) 6 - 23  mg/dL   Creatinine, Ser 1.56 (*) 0.50 - 1.10 mg/dL   Calcium 7.3 (*) 8.4 - 10.5 mg/dL   GFR calc non Af Amer 34 (*) >90 mL/min   GFR calc Af Amer 39 (*) >90 mL/min  CBC      Result Value Ref Range   WBC 11.6 (*) 4.0 - 10.5 K/uL   RBC 3.07 (*) 3.87 - 5.11 MIL/uL   Hemoglobin 8.9 (*) 12.0 - 15.0 g/dL   HCT 26.6 (*) 36.0 - 46.0 %   MCV 86.6  78.0 - 100.0 fL   MCH 29.0  26.0 - 34.0 pg   MCHC 33.5  30.0 - 36.0 g/dL   RDW 15.3  11.5 - 15.5 %   Platelets 224  150 - 400 K/uL  GLUCOSE, CAPILLARY      Result Value Ref Range   Glucose-Capillary 165 (*) 70 - 99 mg/dL   Comment 1 Documented in Chart     Comment 2 Notify RN    GLUCOSE, CAPILLARY      Result Value Ref Range   Glucose-Capillary 137 (*) 70 - 99 mg/dL   Comment 1 Documented in Chart     Comment 2 Notify RN    GLUCOSE, CAPILLARY      Result Value Ref Range   Glucose-Capillary 172 (*) 70 - 99 mg/dL   Comment 1 Documented in Chart     Comment 2 Notify RN    GLUCOSE, CAPILLARY      Result Value Ref Range   Glucose-Capillary 129 (*) 70 - 99 mg/dL   Comment 1 Documented in Chart     Comment 2 Notify RN    I-STAT CG4 LACTIC ACID, ED      Result Value Ref Range   Lactic Acid, Venous 1.30  0.5 - 2.2 mmol/L  PREPARE RBC (CROSSMATCH)      Result Value Ref Range   Order Confirmation ORDER PROCESSED BY BLOOD BANK    TYPE AND SCREEN      Result Value Ref Range   ABO/RH(D) O POS     Antibody Screen NEG     Sample Expiration 02/24/2014     Unit Number [id number]     Blood Component Type RBC LR PHER2     Unit division 00     Status of Unit ISSUED,FINAL     Transfusion Status OK TO TRANSFUSE     Crossmatch Result Compatible     Unit Number [id number]     Blood Component Type RED CELLS,LR     Unit division 00     Status of Unit ISSUED,FINAL     Transfusion Status OK TO TRANSFUSE     Crossmatch Result Compatible     Unit Number [id number]     Blood Component Type RBC LR PHER2     Unit division 00     Status of Unit  ISSUED,FINAL     Transfusion Status OK TO TRANSFUSE     Crossmatch Result Compatible     Unit Number [id number]     Blood Component Type RED CELLS,LR     Unit division 00     Status of Unit ISSUED,FINAL     Transfusion Status OK TO TRANSFUSE     Crossmatch Result Compatible     Unit Number [id number]     Blood Component Type RED CELLS,LR     Unit division 00     Status of Unit ISSUED,FINAL     Transfusion Status OK TO TRANSFUSE     Crossmatch Result Compatible    ABO/RH      Result Value Ref Range   ABO/RH(D) O POS    PREPARE RBC (CROSSMATCH)      Result Value Ref Range   Order Confirmation ORDER PROCESSED BY BLOOD BANK     A/P: Pt with recent history of left lower extremity debridement of wound as well as panniculectomy one month ago at [facility] by Dr. Luetta Nutting (plastic surgery). Postoperatively developed PE and was placed on anticoagulation. Was also discharged with Foley catheter in place to avoid urine getting on the left lower extremity  wound. Patient developed sepsis secondary to urinary tract source. Was initially placed on IV antibiotics and her hospital course has been complicated by left lower extremity bleeding as patient required anticoagulation to protect her from pulmonary embolism. As a result of continued left lower extremity bleeding patient has required multiple transfusions and currently is awaiting revision in OR today 02/23/2014 of which no further anticoagulation is recommended by surgeon. Plan is for IVC filter placement today prior to  surgical procedure. Details/risks of procedure d/w pt with her understanding and consent.

## 2014-02-23 NOTE — Progress Notes (Signed)
CSW continuing to follow for disposition planning.  Pt admitted from [facility].  CSW reviewed chart and noted that plan is to take patient to the OR today 02/23/2014.  CSW updated [employer].   CSW to continue to follow.  Alison Murray, MSW, [facility] [phone]

## 2014-02-23 NOTE — Progress Notes (Signed)
[facility] PROGRESS NOTE  Kelly [name] BPZ:[id number] DOB: [birth year] DOA: 02/20/2014 PCP: Alvester Chou, NP Brief Narrative: [name] with recent history of left lower extremity debridement of wound as well as panniculectomy one month ago at [facility] by Dr. Luetta Nutting (plastic surgery). Postoperatively developed PE and was placed on anticoagulation. Was also discharged with Foley catheter in place to avoid urine getting on the left lower extremity wound. Patient developed sepsis secondary to urinary tract source. Was initially placed on IV antibiotics and her hospital course has been complicated by left lower extremity bleeding as patient required anticoagulation to protect her from pulmonary embolism.  As a result of continued left lower extremity bleeding patient has required multiple transfusions and currently is awaiting revision in OR today 02/23/2014 of which no further anticoagulation is recommended by surgeon. IR consulted for Greenfield filter placement.  Assessment/Plan: Active Problems: Sepsis associated hypotension - Resolved with IVF's and antibiotics as well as cessation of bleeding from lower extremity after discontinuation of anticoagulants.    Severe sepsis -  Source most likely urinary - Continue Cefepime and Vancomycin - monitor closely in ICU    Diabetes mellitus type 2 with neurological manifestations - SSI and diabetic diet (NPO currently for OR by surgeon- continue diabetic diet once patient able to eat)    Chronic diastolic heart failure - given hypotension will hold medications which may decrease her blood pressures - gentle fluid hydration.    Pulmonary embolism - Pt has required multiple blood transfusions secondary to persistent bleeding from left lower extremity. As a result anticoagulation has been held. We were unable to continue anticoagulation as patient's hemoglobin was below 8.0 yesterday with reports of continued bleeding. - Case  was discussed with oncology/hematology and recommendations are for Greenfield filter. Case also discussed with surgeon states at this juncture anticoagulation is not recommended from her standpoint. - Case also discussed with interventional radiology for placement of Greenfield filter  Acute blood loss anemia - Requiring several transfusion of PRBC - Pt has h/o CKD related anemia at baseline per EMR - Continue to monitor with serial cbc's - Consulted Dr. Theodoro Kos associate of Dr. Luetta Nutting (surgeon who performed recent LLE debridement). Would like to thank Dr. Migdalia Dk for her assistance with this case. Plan is to take patient to the OR today 02/23/2014     UTI (urinary tract infection) due to urinary indwelling Foley catheter - Awaiting urine culture - continue broad spectrum antibiotics  Morbid obesity - Registered dietitian on board   Code Status: full Family Communication: None Disposition Plan: Pending further evaluation and recommendations from specialist.   Consultants:  Plastic Surgery: Dr. Migdalia Dk  Pharmacy  Interventional radiology  Procedures:  none  Antibiotics:  Cefepime and Vancomycin  HPI/Subjective:  No new complaints from patient. She understands the need for Greenfield filter and verbalizes agreement and understanding.  Objective: Filed Vitals:   02/23/14 0800  BP:   Pulse:   Temp: 100.8 F (38.2 C)  Resp:     Intake/Output Summary (Last 24 hours) at 02/23/14 1008 Last data filed at 02/23/14 0604  Gross per 24 hour  Intake 2127.71 ml  Output   1451 ml  Net 676.71 ml   Filed Weights   02/21/14 0445 02/22/14 0500  Weight: 147.873 kg (326 lb) 147.873 kg (326 lb)    Exam:   General:  Pt in NAD, alert and awake  Cardiovascular: RRR, no MRG  Respiratory: CTA BL, no wheezes, no increased wob  Abdomen: soft,  NT, ND, obese  Musculoskeletal: no cyanosis or clubbing, guaze in place over LLE   Data Reviewed: Basic Metabolic  Panel:  Recent Labs Lab 02/20/14 2335 02/21/14 0530 02/22/14 0200 02/23/14 0330  NA 135* 136* 134* 135*  K 5.3 5.5* 5.3 5.4*  CL 102 104 103 106  CO2 20 19 18* 20  GLUCOSE 174* 171* 181* 194*  BUN 24* 26* 31* 35*  CREATININE 1.42* 1.62* 1.60* 1.56*  CALCIUM 8.1* 8.0* 7.6* 7.3*   Liver Function Tests:  Recent Labs Lab 02/20/14 2335  AST 23  ALT 25  ALKPHOS 153*  BILITOT 0.2*  PROT 7.0  ALBUMIN 1.9*   No results found for this basename: LIPASE, AMYLASE,  in the last 168 hours No results found for this basename: AMMONIA,  in the last 168 hours CBC:  Recent Labs Lab 02/20/14 2335 02/21/14 0530 02/21/14 1616 02/22/14 0200 02/22/14 1030 02/23/14 0330  WBC 13.3* 17.7* 12.9* 13.8* 10.7* 11.6*  NEUTROABS 12.2*  --   --   --   --   --   HGB 9.0* 8.3* 6.4* 8.2* 6.4* 8.9*  HCT 28.0* 25.9* 20.2* 24.1* 19.5* 26.6*  MCV 89.7 90.2 91.0 87.6 85.9 86.6  PLT 370 328 271 287 PLATELET CLUMPS NOTED ON SMEAR, COUNT APPEARS ADEQUATE 224   Cardiac Enzymes: No results found for this basename: CKTOTAL, CKMB, CKMBINDEX, TROPONINI,  in the last 168 hours BNP (last 3 results) No results found for this basename: PROBNP,  in the last 8760 hours CBG:  Recent Labs Lab 02/22/14 0742 02/22/14 1234 02/22/14 1702 02/22/14 2103 02/23/14 0827  GLUCAP 159* 165* 137* 172* 129*    Recent Results (from the past 240 hour(s))  CULTURE, BLOOD (ROUTINE X 2)     Status: None   Collection Time    02/20/14 11:06 PM      Result Value Ref Range Status   Specimen Description BLOOD LEFT HAND   Final   Special Requests BOTTLES DRAWN AEROBIC AND ANAEROBIC 3CC   Final   Culture  Setup Time     Final   Value: 02/21/2014 16:18     Performed at [facility]   Culture     Final   Value:        BLOOD CULTURE RECEIVED NO GROWTH TO DATE CULTURE WILL BE HELD FOR 5 DAYS BEFORE ISSUING A FINAL NEGATIVE REPORT     Performed at [facility]   Report Status PENDING   Incomplete  CULTURE, BLOOD  (ROUTINE X 2)     Status: None   Collection Time    02/20/14 11:35 PM      Result Value Ref Range Status   Specimen Description BLOOD LEFT ANTECUBITAL   Final   Special Requests BOTTLES DRAWN AEROBIC AND ANAEROBIC 5CC   Final   Culture  Setup Time     Final   Value: 02/21/2014 16:18     Performed at [facility]   Culture     Final   Value:        BLOOD CULTURE RECEIVED NO GROWTH TO DATE CULTURE WILL BE HELD FOR 5 DAYS BEFORE ISSUING A FINAL NEGATIVE REPORT     Performed at [facility]   Report Status PENDING   Incomplete  URINE CULTURE     Status: None   Collection Time    02/21/14  3:21 AM      Result Value Ref Range Status   Specimen Description URINE, RANDOM  Final   Special Requests NONE   Final   Culture  Setup Time     Final   Value: 02/21/2014 16:29     Performed at [facility]     Final   Value: >=100,000 COLONIES/ML     Performed at [facility]   Culture     Final   Value: ESCHERICHIA COLI     Performed at [facility]   Report Status PENDING   Incomplete  MRSA PCR SCREENING     Status: None   Collection Time    02/21/14  5:37 AM      Result Value Ref Range Status   MRSA by PCR NEGATIVE  NEGATIVE Final   Comment:            The GeneXpert MRSA Assay (FDA     approved for NASAL specimens     only), is one component of a     comprehensive MRSA colonization     surveillance program. It is not     intended to diagnose MRSA     infection nor to guide or     monitor treatment for     MRSA infections.     Studies: No results found.  Scheduled Meds: . allopurinol  100 mg Oral Daily  . aspirin  325 mg Oral Daily  . atorvastatin  40 mg Oral q1800  . ceFEPime (MAXIPIME) IV  2 g Intravenous Q12H  . feeding supplement (GLUCERNA SHAKE)  237 mL Oral TID BM  . gabapentin  400 mg Oral BID  . insulin aspart  0-5 Units Subcutaneous QHS  . insulin aspart  0-9 Units Subcutaneous TID WC  . pantoprazole  80 mg Oral  Q1200  . pneumococcal 23 valent vaccine  0.5 mL Intramuscular Tomorrow-1000  . polyethylene glycol  17 g Oral Daily  . sodium chloride  3 mL Intravenous Q12H  . vancomycin  1,750 mg Intravenous Q24H   Continuous Infusions:     Time spent: > 45 minutes of critical care time    Velvet Bathe  [facility] Pager [phone] If 7PM-7AM, please contact night-coverage at [email or web address], password [email or web address] 02/23/2014, 10:08 AM  LOS: 3 days

## 2014-02-23 NOTE — Procedures (Signed)
Procedure:  IVC filter placement Access:  Right IJ vein Contrast:  CO2 Findings:  Normally patent IVC.  [place] filter placed in infrarenal IVC.

## 2014-02-24 ENCOUNTER — Encounter (HOSPITAL_COMMUNITY): Payer: Self-pay | Admitting: Plastic Surgery

## 2014-02-24 ENCOUNTER — Inpatient Hospital Stay (HOSPITAL_COMMUNITY): Payer: Medicare Other | Admitting: Certified Registered Nurse Anesthetist

## 2014-02-24 ENCOUNTER — Encounter (HOSPITAL_COMMUNITY)
Admission: EM | Disposition: A | Discharge status: Skilled Nursing Facility | Payer: Self-pay | Source: Home / Self Care | Attending: Family Medicine

## 2014-02-24 ENCOUNTER — Encounter (HOSPITAL_COMMUNITY): Payer: Medicare Other | Admitting: Certified Registered Nurse Anesthetist

## 2014-02-24 DIAGNOSIS — D62 Acute posthemorrhagic anemia: Secondary | ICD-10-CM

## 2014-02-24 DIAGNOSIS — I2699 Other pulmonary embolism without acute cor pulmonale: Secondary | ICD-10-CM | POA: Diagnosis not present

## 2014-02-24 DIAGNOSIS — E1142 Type 2 diabetes mellitus with diabetic polyneuropathy: Secondary | ICD-10-CM | POA: Diagnosis not present

## 2014-02-24 DIAGNOSIS — A419 Sepsis, unspecified organism: Secondary | ICD-10-CM | POA: Diagnosis not present

## 2014-02-24 DIAGNOSIS — N179 Acute kidney failure, unspecified: Secondary | ICD-10-CM | POA: Diagnosis not present

## 2014-02-24 HISTORY — PX: VENA CAVA FILTER PLACEMENT: SUR1032

## 2014-02-24 HISTORY — PX: IRRIGATION AND DEBRIDEMENT ABSCESS: SHX5252

## 2014-02-24 LAB — GLUCOSE, CAPILLARY
GLUCOSE-CAPILLARY: 152 mg/dL — AB (ref 70–99)
Glucose-Capillary: 145 mg/dL — ABNORMAL HIGH (ref 70–99)
Glucose-Capillary: 120 mg/dL — ABNORMAL HIGH (ref 70–99)
Glucose-Capillary: 126 mg/dL — ABNORMAL HIGH (ref 70–99)
Glucose-Capillary: 163 mg/dL — ABNORMAL HIGH (ref 70–99)

## 2014-02-24 LAB — CBC
HCT: 26.2 % — ABNORMAL LOW (ref 36.0–46.0)
Hemoglobin: 8.8 g/dL — ABNORMAL LOW (ref 12.0–15.0)
MCH: 29.6 pg (ref 26.0–34.0)
MCHC: 33.6 g/dL (ref 30.0–36.0)
MCV: 88.2 fL (ref 78.0–100.0)
PLATELETS: 266 10*3/uL (ref 150–400)
RBC: 2.97 MIL/uL — ABNORMAL LOW (ref 3.87–5.11)
RDW: 15.4 % (ref 11.5–15.5)
WBC: 10.1 10*3/uL (ref 4.0–10.5)

## 2014-02-24 LAB — URINE CULTURE: Colony Count: >=100000

## 2014-02-24 LAB — BASIC METABOLIC PANEL
BUN: 31 mg/dL — AB (ref 6–23)
CALCIUM: 7.8 mg/dL — AB (ref 8.4–10.5)
CO2: 18 mEq/L — ABNORMAL LOW (ref 19–32)
Chloride: 109 mEq/L (ref 96–112)
Creatinine, Ser: 1.31 mg/dL — ABNORMAL HIGH (ref 0.50–1.10)
GFR, EST AFRICAN AMERICAN: 48 mL/min — AB (ref >90)
GFR, EST NON AFRICAN AMERICAN: 41 mL/min — AB (ref >90)
GLUCOSE: 180 mg/dL — AB (ref 70–99)
POTASSIUM: 5.2 meq/L (ref 3.7–5.3)
Sodium: 137 mEq/L (ref 137–147)

## 2014-02-24 LAB — SURGICAL PCR SCREEN
MRSA, PCR: NEGATIVE
Staphylococcus aureus: NEGATIVE

## 2014-02-24 SURGERY — MINOR INCISION AND DRAINAGE OF ABSCESS
Anesthesia: General | Site: Leg Upper | Laterality: Left

## 2014-02-24 MED ORDER — LACTATED RINGERS IV SOLN: INTRAVENOUS | Status: DC

## 2014-02-24 MED ORDER — PROPOFOL 10 MG/ML IV BOLUS
INTRAVENOUS | Status: AC
  Filled 2014-02-24: qty 20

## 2014-02-24 MED ORDER — ONDANSETRON HCL 4 MG/2ML IJ SOLN
INTRAMUSCULAR | Status: DC | PRN
  Administered 2014-02-24: 4 mg via INTRAVENOUS

## 2014-02-24 MED ORDER — 0.9 % SODIUM CHLORIDE (POUR BTL) OPTIME
TOPICAL | Status: DC | PRN
  Administered 2014-02-24 (×2): 1000 mL

## 2014-02-24 MED ORDER — SUCCINYLCHOLINE CHLORIDE 20 MG/ML IJ SOLN
INTRAMUSCULAR | Status: DC | PRN
  Administered 2014-02-24: 160 mg via INTRAVENOUS

## 2014-02-24 MED ORDER — FENTANYL CITRATE 0.05 MG/ML IJ SOLN
INTRAMUSCULAR | Status: AC
  Filled 2014-02-24: qty 2

## 2014-02-24 MED ORDER — BUPIVACAINE-EPINEPHRINE 0.25% -1:200000 IJ SOLN
INTRAMUSCULAR | Status: AC
  Filled 2014-02-24: qty 1

## 2014-02-24 MED ORDER — PROPOFOL 10 MG/ML IV BOLUS
INTRAVENOUS | Status: DC | PRN
  Administered 2014-02-24: 280 mg via INTRAVENOUS

## 2014-02-24 MED ORDER — HYDROMORPHONE HCL PF 1 MG/ML IJ SOLN
0.2500 mg | INTRAMUSCULAR | Status: DC | PRN
  Administered 2014-02-24 (×4): 0.5 mg via INTRAVENOUS

## 2014-02-24 MED ORDER — LIDOCAINE HCL (CARDIAC) 20 MG/ML IV SOLN
INTRAVENOUS | Status: AC
  Filled 2014-02-24: qty 5

## 2014-02-24 MED ORDER — FENTANYL CITRATE 0.05 MG/ML IJ SOLN
INTRAMUSCULAR | Status: DC | PRN
  Administered 2014-02-24 (×3): 100 ug via INTRAVENOUS

## 2014-02-24 MED ORDER — HYDROMORPHONE HCL PF 1 MG/ML IJ SOLN
INTRAMUSCULAR | Status: AC
  Filled 2014-02-24: qty 1

## 2014-02-24 MED ORDER — LIDOCAINE HCL (CARDIAC) 20 MG/ML IV SOLN
INTRAVENOUS | Status: DC | PRN
  Administered 2014-02-24: 100 mg via INTRAVENOUS

## 2014-02-24 MED ORDER — ONDANSETRON HCL 4 MG/2ML IJ SOLN
INTRAMUSCULAR | Status: AC
  Filled 2014-02-24: qty 2

## 2014-02-24 MED ORDER — SODIUM CHLORIDE 0.9 % IV SOLN
INTRAVENOUS | Status: DC | PRN
  Administered 2014-02-24: 09:00:00 via INTRAVENOUS

## 2014-02-24 MED ORDER — SODIUM CHLORIDE 0.9 % IJ SOLN
INTRAMUSCULAR | Status: AC
  Filled 2014-02-24: qty 10

## 2014-02-24 MED ORDER — MIDAZOLAM HCL 2 MG/2ML IJ SOLN
INTRAMUSCULAR | Status: AC
  Filled 2014-02-24: qty 2

## 2014-02-24 MED ORDER — SODIUM CHLORIDE 0.9 % IR SOLN
Status: DC | PRN
  Administered 2014-02-24 (×3)

## 2014-02-24 MED ORDER — SODIUM CHLORIDE 0.9 % IV SOLN
500.0000 mg | Freq: Three times a day (TID) | INTRAVENOUS | Status: DC
  Administered 2014-02-24 – 2014-02-25 (×4): 500 mg via INTRAVENOUS
  Filled 2014-02-24 (×6): qty 500

## 2014-02-24 MED ORDER — TISSEEL VH 10 ML EX KIT
PACK | CUTANEOUS | Status: AC
  Filled 2014-02-24: qty 1

## 2014-02-24 SURGICAL SUPPLY — 51 items
BAG SPEC THK2 15X12 ZIP CLS (MISCELLANEOUS) ×1
BAG ZIPLOCK 12X15 (MISCELLANEOUS) ×3 IMPLANT
BANDAGE ELASTIC 4 VELCRO ST LF (GAUZE/BANDAGES/DRESSINGS) ×2 IMPLANT
BLADE HEX COATED 2.75 (ELECTRODE) ×3 IMPLANT
BNDG GAUZE ELAST 4 BULKY (GAUZE/BANDAGES/DRESSINGS) ×1 IMPLANT
CORDS BIPOLAR (ELECTRODE) ×2 IMPLANT
COVER SURGICAL LIGHT HANDLE (MISCELLANEOUS) ×3 IMPLANT
CUFF TOURN SGL QUICK 18 (TOURNIQUET CUFF) ×1 IMPLANT
CUFF TOURN SGL QUICK 24 (TOURNIQUET CUFF)
CUFF TRNQT CYL 24X4X40X1 (TOURNIQUET CUFF) IMPLANT
DRAIN CHANNEL 19F RND (DRAIN) IMPLANT
DRAPE INCISE IOBAN 85X60 (DRAPES) ×2 IMPLANT
DRAPE SURG 17X11 SM STRL (DRAPES) ×3 IMPLANT
DRSG ADAPTIC 3X8 NADH LF (GAUZE/BANDAGES/DRESSINGS) ×2 IMPLANT
DRSG PAD ABDOMINAL 8X10 ST (GAUZE/BANDAGES/DRESSINGS) ×1 IMPLANT
DURAPREP 26ML APPLICATOR (WOUND CARE) ×1 IMPLANT
ELECT REM PT RETURN 9FT ADLT (ELECTROSURGICAL) ×3
ELECTRODE REM PT RTRN 9FT ADLT (ELECTROSURGICAL) ×1 IMPLANT
EVACUATOR SILICONE 100CC (DRAIN) IMPLANT
GAUZE SPONGE 4X4 12PLY STRL (GAUZE/BANDAGES/DRESSINGS) ×6 IMPLANT
GLOVE BIO SURGEON STRL SZ 6.5 (GLOVE) ×3 IMPLANT
GLOVE BIO SURGEONS STRL SZ 6.5 (GLOVE) ×2
GLOVE SURG ORTHO 8.0 STRL STRW (GLOVE) ×6 IMPLANT
HANDPIECE INTERPULSE COAX TIP (DISPOSABLE)
KIT BASIN OR (CUSTOM PROCEDURE TRAY) ×3 IMPLANT
MANIFOLD NEPTUNE II (INSTRUMENTS) ×3 IMPLANT
MATRISTERN SURG MATRIX RS10X15 (Tissue) ×2 IMPLANT
MICROMATRIX 1000MG (Tissue) ×12 IMPLANT
NDL HYPO 25X1 1.5 SAFETY (NEEDLE) ×1 IMPLANT
NEEDLE HYPO 22GX1.5 SAFETY (NEEDLE) ×3 IMPLANT
NEEDLE HYPO 25X1 1.5 SAFETY (NEEDLE) ×3 IMPLANT
NS IRRIG 1000ML POUR BTL (IV SOLUTION) ×1 IMPLANT
PACK LOWER EXTREMITY WL (CUSTOM PROCEDURE TRAY) ×3 IMPLANT
PAD CAST 3X4 CTTN HI CHSV (CAST SUPPLIES) ×1 IMPLANT
PAD CAST 4YDX4 CTTN HI CHSV (CAST SUPPLIES) ×2 IMPLANT
PADDING CAST ABS 4INX4YD NS (CAST SUPPLIES)
PADDING CAST ABS COTTON 4X4 ST (CAST SUPPLIES) ×2 IMPLANT
PADDING CAST COTTON 3X4 STRL (CAST SUPPLIES)
PADDING CAST COTTON 4X4 STRL (CAST SUPPLIES)
SET HNDPC FAN SPRY TIP SCT (DISPOSABLE) ×1 IMPLANT
SOL PREP POV-IOD 16OZ 10% (MISCELLANEOUS) ×3 IMPLANT
SOLUTION PARTIC MCRMTRX 1000MG (Tissue) IMPLANT
SPLINT FIBERGLASS 4X15 (CAST SUPPLIES) IMPLANT
STAPLER VISISTAT 35W (STAPLE) ×3 IMPLANT
SUT ETHILON 2 0 PS N (SUTURE) ×4 IMPLANT
SUT SILK 0 FSL (SUTURE) ×1 IMPLANT
SUT VIC AB 5-0 PS2 18 (SUTURE) IMPLANT
SWAB COLLECTION DEVICE MRSA (MISCELLANEOUS) ×3 IMPLANT
SYR CONTROL 10ML LL (SYRINGE) ×3 IMPLANT
TUBE ANAEROBIC SPECIMEN COL (MISCELLANEOUS) ×1 IMPLANT
WATER STERILE IRR 1500ML POUR (IV SOLUTION) ×1 IMPLANT

## 2014-02-24 NOTE — Transfer of Care (Signed)
Immediate Anesthesia Transfer of Care Note  Patient: Kelly [name]  Procedure(s) Performed: Procedure(s): MINOR INCISION AND DRAINAGE OF ABSCESS (Left)  Patient Location: PACU  Anesthesia Type:General  Level of Consciousness: awake and alert   Airway & Oxygen Therapy: Patient Spontanous Breathing and Patient connected to face mask oxygen  Post-op Assessment: Report given to PACU RN and Post -op Vital signs reviewed and stable  Post vital signs: Reviewed and stable  Complications: No apparent anesthesia complications

## 2014-02-24 NOTE — Anesthesia Postprocedure Evaluation (Signed)
  Anesthesia Post-op Note  Patient: Kelly [name]  Procedure(s) Performed: Procedure(s) (LRB): MINOR INCISION AND DRAINAGE OF ABSCESS (Left)  Patient Location: PACU  Anesthesia Type: General  Level of Consciousness: awake and alert   Airway and Oxygen Therapy: Patient Spontanous Breathing  Post-op Pain: mild  Post-op Assessment: Post-op Vital signs reviewed, Patient's Cardiovascular Status Stable, Respiratory Function Stable, Patent Airway and No signs of Nausea or vomiting  Last Vitals:  Filed Vitals:   02/24/14 1100  BP: 172/64  Pulse: 104  Temp: 36.9 C  Resp: 17    Post-op Vital Signs: stable   Complications: No apparent anesthesia complications

## 2014-02-24 NOTE — H&P (View-Only) (Signed)
Reason for Consult:left leg wound Referring Physician: Primary Service  Kelly [name] is an 68 y.o. female.  HPI: The patient is a 68 yrs old bf here for evaluation of her left leg, anemia, and diabetes.  She underwent a left leg tissue resection (panniculectomy) one month ago at [facility].  She was not closed completely as the plan was for partial secondary intention healing.  She was followed by medicine and then sent to a nursing home for further rehabilitation.  She had a VAC in place.  During one of the VAC changes she had some bleeding.  Due to a PE, she was anticoagulated which contributed to the bleeding.  She is now at [facility] and the wound on the left upper thigh is being packed with wet to dry dressings.  She has some clot in the area and most of the wound has opened. It does not look like it is infected.      Past Medical History  Diagnosis Date  . Hypertension   . Diabetes mellitus   . Hyperlipidemia   . CHF (congestive heart failure)   . GERD (gastroesophageal reflux disease)   . Depression   . Sleep apnea     doesn't use cpap machine or 02 at night  . Headache(784.0)     Migraines  . Arthritis     knees  . Anemia     Had blood transfusion x 2 in yhe 90s  . Lymphedema of leg   . Pulmonary embolism 02/18/14    diagnosed at [facility] with CT angio chest  . Chronic indwelling Foley catheter     Past Surgical History  Procedure Laterality Date  . Rotator cuff repair      right  . Knee arthroscopy      right  . Temple biopsy    . Colonoscopy with propofol  09/23/2012    Procedure: COLONOSCOPY WITH PROPOFOL;  Surgeon: Jerene Bears, MD;  Location: [facility];  Service: Gastroenterology;  Laterality: N/A;  . Paniculectomy with wound debridement Left 02/08/2014    left medial thigh    Family History  Problem Relation Age of Onset  . Emphysema Father   . Asthma Brother   . Heart disease Mother   . Stomach cancer Brother   . Heart disease Maternal Grandmother   . Diabetes Mother     . Diabetes Sister     Social History:  reports that she quit smoking about 32 years ago. Her smoking use included Cigarettes. She smoked 0.00 packs per day. She has never used smokeless tobacco. She reports that she does not drink alcohol or use illicit drugs.  Allergies:  Allergies  Allergen Reactions  . Penicillins Hives    Medications: I have reviewed the patient's current medications.  Results for orders placed during the hospital encounter of 02/20/14 (from the past 48 hour(s))  CULTURE, BLOOD (ROUTINE X 2)     Status: None   Collection Time    02/20/14 11:06 PM      Result Value Ref Range   Specimen Description BLOOD LEFT HAND     Special Requests BOTTLES DRAWN AEROBIC AND ANAEROBIC 3CC     Culture  Setup Time       Value: 02/21/2014 16:18     Performed at [facility]   Culture       Value:        BLOOD CULTURE RECEIVED NO GROWTH TO DATE CULTURE WILL BE HELD FOR 5 DAYS BEFORE ISSUING  A FINAL NEGATIVE REPORT     Performed at [facility]   Report Status PENDING    CBC WITH DIFFERENTIAL     Status: Abnormal   Collection Time    02/20/14 11:35 PM      Result Value Ref Range   WBC 13.3 (*) 4.0 - 10.5 K/uL   RBC 3.12 (*) 3.87 - 5.11 MIL/uL   Hemoglobin 9.0 (*) 12.0 - 15.0 g/dL   HCT 28.0 (*) 36.0 - 46.0 %   MCV 89.7  78.0 - 100.0 fL   MCH 28.8  26.0 - 34.0 pg   MCHC 32.1  30.0 - 36.0 g/dL   RDW 16.6 (*) 11.5 - 15.5 %   Platelets 370  150 - 400 K/uL   Neutrophils Relative % 91 (*) 43 - 77 %   Neutro Abs 12.2 (*) 1.7 - 7.7 K/uL   Lymphocytes Relative 4 (*) 12 - 46 %   Lymphs Abs 0.5 (*) 0.7 - 4.0 K/uL   Monocytes Relative 4  3 - 12 %   Monocytes Absolute 0.5  0.1 - 1.0 K/uL   Eosinophils Relative 1  0 - 5 %   Eosinophils Absolute 0.1  0.0 - 0.7 K/uL   Basophils Relative 0  0 - 1 %   Basophils Absolute 0.0  0.0 - 0.1 K/uL  COMPREHENSIVE METABOLIC PANEL     Status: Abnormal   Collection Time    02/20/14 11:35 PM      Result Value Ref Range    Sodium 135 (*) 137 - 147 mEq/L   Potassium 5.3  3.7 - 5.3 mEq/L   Chloride 102  96 - 112 mEq/L   CO2 20  19 - 32 mEq/L   Glucose, Bld 174 (*) 70 - 99 mg/dL   BUN 24 (*) 6 - 23 mg/dL   Creatinine, Ser 1.42 (*) 0.50 - 1.10 mg/dL   Calcium 8.1 (*) 8.4 - 10.5 mg/dL   Total Protein 7.0  6.0 - 8.3 g/dL   Albumin 1.9 (*) 3.5 - 5.2 g/dL   AST 23  0 - 37 U/L   ALT 25  0 - 35 U/L   Alkaline Phosphatase 153 (*) 39 - 117 U/L   Total Bilirubin 0.2 (*) 0.3 - 1.2 mg/dL   GFR calc non Af Amer 38 (*) >90 mL/min   GFR calc Af Amer 44 (*) >90 mL/min   Comment: (NOTE)     The eGFR has been calculated using the CKD EPI equation.     This calculation has not been validated in all clinical situations.     eGFR's persistently <90 mL/min signify possible Chronic Kidney     Disease.  CULTURE, BLOOD (ROUTINE X 2)     Status: None   Collection Time    02/20/14 11:35 PM      Result Value Ref Range   Specimen Description BLOOD LEFT ANTECUBITAL     Special Requests BOTTLES DRAWN AEROBIC AND ANAEROBIC 5CC     Culture  Setup Time       Value: 02/21/2014 16:18     Performed at [facility]   Culture       Value:        BLOOD CULTURE RECEIVED NO GROWTH TO DATE CULTURE WILL BE HELD FOR 5 DAYS BEFORE ISSUING A FINAL NEGATIVE REPORT     Performed at [facility]   Report Status PENDING    PROTIME-INR     Status: Abnormal  Collection Time    02/20/14 11:35 PM      Result Value Ref Range   Prothrombin Time 16.4 (*) 11.6 - 15.2 seconds   INR 1.32  0.00 - 1.49  I-STAT CG4 LACTIC ACID, ED     Status: None   Collection Time    02/20/14 11:46 PM      Result Value Ref Range   Lactic Acid, Venous 1.30  0.5 - 2.2 mmol/L  URINALYSIS, ROUTINE W REFLEX MICROSCOPIC     Status: Abnormal   Collection Time    02/21/14  3:21 AM      Result Value Ref Range   Color, Urine YELLOW  YELLOW   APPearance CLOUDY (*) CLEAR   Specific Gravity, Urine 1.014  1.005 - 1.030   pH 5.5  5.0 - 8.0   Glucose, UA NEGATIVE   NEGATIVE mg/dL   Hgb urine dipstick MODERATE (*) NEGATIVE   Bilirubin Urine NEGATIVE  NEGATIVE   Ketones, ur NEGATIVE  NEGATIVE mg/dL   Protein, ur 30 (*) NEGATIVE mg/dL   Urobilinogen, UA 0.2  0.0 - 1.0 mg/dL   Nitrite NEGATIVE  NEGATIVE   Leukocytes, UA SMALL (*) NEGATIVE  URINE CULTURE     Status: None   Collection Time    02/21/14  3:21 AM      Result Value Ref Range   Specimen Description URINE, RANDOM     Special Requests NONE     Culture  Setup Time       Value: 02/21/2014 16:29     Performed at [facility]       Value: >=100,000 COLONIES/ML     Performed at [facility]   Culture       Value: ESCHERICHIA COLI     Performed at [facility]   Report Status PENDING    URINE MICROSCOPIC-ADD ON     Status: Abnormal   Collection Time    02/21/14  3:21 AM      Result Value Ref Range   WBC, UA 11-20  <3 WBC/hpf   RBC / HPF 3-6  <3 RBC/hpf   Bacteria, UA MANY (*) RARE  BASIC METABOLIC PANEL     Status: Abnormal   Collection Time    02/21/14  5:30 AM      Result Value Ref Range   Sodium 136 (*) 137 - 147 mEq/L   Potassium 5.5 (*) 3.7 - 5.3 mEq/L   Chloride 104  96 - 112 mEq/L   CO2 19  19 - 32 mEq/L   Glucose, Bld 171 (*) 70 - 99 mg/dL   BUN 26 (*) 6 - 23 mg/dL   Creatinine, Ser 1.62 (*) 0.50 - 1.10 mg/dL   Calcium 8.0 (*) 8.4 - 10.5 mg/dL   GFR calc non Af Amer 32 (*) >90 mL/min   GFR calc Af Amer 37 (*) >90 mL/min   Comment: (NOTE)     The eGFR has been calculated using the CKD EPI equation.     This calculation has not been validated in all clinical situations.     eGFR's persistently <90 mL/min signify possible Chronic Kidney     Disease.  CBC     Status: Abnormal   Collection Time    02/21/14  5:30 AM      Result Value Ref Range   WBC 17.7 (*) 4.0 - 10.5 K/uL   RBC 2.87 (*) 3.87 - 5.11 MIL/uL   Hemoglobin 8.3 (*)  12.0 - 15.0 g/dL   HCT 25.9 (*) 36.0 - 46.0 %   MCV 90.2  78.0 - 100.0 fL   MCH 28.9  26.0 - 34.0 pg    MCHC 32.0  30.0 - 36.0 g/dL   RDW 16.5 (*) 11.5 - 15.5 %   Platelets 328  150 - 400 K/uL  PROTIME-INR     Status: Abnormal   Collection Time    02/21/14  5:30 AM      Result Value Ref Range   Prothrombin Time 17.3 (*) 11.6 - 15.2 seconds   INR 1.41  0.00 - 1.49  MRSA PCR SCREENING     Status: None   Collection Time    02/21/14  5:37 AM      Result Value Ref Range   MRSA by PCR NEGATIVE  NEGATIVE   Comment:            The GeneXpert MRSA Assay (FDA     approved for NASAL specimens     only), is one component of a     comprehensive MRSA colonization     surveillance program. It is not     intended to diagnose MRSA     infection nor to guide or     monitor treatment for     MRSA infections.  GLUCOSE, CAPILLARY     Status: Abnormal   Collection Time    02/21/14  7:55 AM      Result Value Ref Range   Glucose-Capillary 166 (*) 70 - 99 mg/dL  GLUCOSE, CAPILLARY     Status: Abnormal   Collection Time    02/21/14 12:22 PM      Result Value Ref Range   Glucose-Capillary 176 (*) 70 - 99 mg/dL  CBC     Status: Abnormal   Collection Time    02/21/14  4:16 PM      Result Value Ref Range   WBC 12.9 (*) 4.0 - 10.5 K/uL   RBC 2.22 (*) 3.87 - 5.11 MIL/uL   Hemoglobin 6.4 (*) 12.0 - 15.0 g/dL   Comment: REPEATED TO VERIFY     DELTA CHECK NOTED     CRITICAL RESULT CALLED TO, READ BACK BY AND VERIFIED WITH:     J DEUTSCH AT 1637 ON 06.28.2015 BY NBROOKS   HCT 20.2 (*) 36.0 - 46.0 %   MCV 91.0  78.0 - 100.0 fL   MCH 28.4  26.0 - 34.0 pg   MCHC 31.2  30.0 - 36.0 g/dL   RDW 16.6 (*) 11.5 - 15.5 %   Platelets 271  150 - 400 K/uL  GLUCOSE, CAPILLARY     Status: Abnormal   Collection Time    02/21/14  5:12 PM      Result Value Ref Range   Glucose-Capillary 167 (*) 70 - 99 mg/dL  TYPE AND SCREEN     Status: None   Collection Time    02/21/14  5:35 PM      Result Value Ref Range   ABO/RH(D) O POS     Antibody Screen NEG     Sample Expiration 02/24/2014     Unit Number [id number]      Blood Component Type RBC LR PHER2     Unit division 00     Status of Unit ISSUED,FINAL     Transfusion Status OK TO TRANSFUSE     Crossmatch Result Compatible     Unit Number [id number]     Blood Component Type RED  CELLS,LR     Unit division 00     Status of Unit ISSUED,FINAL     Transfusion Status OK TO TRANSFUSE     Crossmatch Result Compatible     Unit Number [id number]     Blood Component Type RBC LR PHER2     Unit division 00     Status of Unit ISSUED     Transfusion Status OK TO TRANSFUSE     Crossmatch Result Compatible     Unit Number [id number]     Blood Component Type RED CELLS,LR     Unit division 00     Status of Unit ISSUED     Transfusion Status OK TO TRANSFUSE     Crossmatch Result Compatible     Unit Number [id number]     Blood Component Type RED CELLS,LR     Unit division 00     Status of Unit ALLOCATED     Transfusion Status OK TO TRANSFUSE     Crossmatch Result Compatible    ABO/RH     Status: None   Collection Time    02/21/14  5:35 PM      Result Value Ref Range   ABO/RH(D) O POS    PREPARE RBC (CROSSMATCH)     Status: None   Collection Time    02/21/14  5:42 PM      Result Value Ref Range   Order Confirmation ORDER PROCESSED BY BLOOD BANK    GLUCOSE, CAPILLARY     Status: Abnormal   Collection Time    02/21/14 10:41 PM      Result Value Ref Range   Glucose-Capillary 211 (*) 70 - 99 mg/dL  CBC     Status: Abnormal   Collection Time    02/22/14  2:00 AM      Result Value Ref Range   WBC 13.8 (*) 4.0 - 10.5 K/uL   RBC 2.75 (*) 3.87 - 5.11 MIL/uL   Hemoglobin 8.2 (*) 12.0 - 15.0 g/dL   Comment: DELTA CHECK NOTED     POST TRANSFUSION SPECIMEN   HCT 24.1 (*) 36.0 - 46.0 %   MCV 87.6  78.0 - 100.0 fL   MCH 29.8  26.0 - 34.0 pg   MCHC 34.0  30.0 - 36.0 g/dL   RDW 15.8 (*) 11.5 - 15.5 %   Platelets 287  150 - 400 K/uL  BASIC METABOLIC PANEL     Status: Abnormal   Collection Time    02/22/14  2:00 AM      Result Value Ref Range     Sodium 134 (*) 137 - 147 mEq/L   Potassium 5.3  3.7 - 5.3 mEq/L   Chloride 103  96 - 112 mEq/L   CO2 18 (*) 19 - 32 mEq/L   Glucose, Bld 181 (*) 70 - 99 mg/dL   BUN 31 (*) 6 - 23 mg/dL   Creatinine, Ser 1.60 (*) 0.50 - 1.10 mg/dL   Calcium 7.6 (*) 8.4 - 10.5 mg/dL   GFR calc non Af Amer 33 (*) >90 mL/min   GFR calc Af Amer 38 (*) >90 mL/min   Comment: (NOTE)     The eGFR has been calculated using the CKD EPI equation.     This calculation has not been validated in all clinical situations.     eGFR's persistently <90 mL/min signify possible Chronic Kidney     Disease.  HEMOGLOBIN A1C     Status: Abnormal   Collection Time  02/22/14  2:00 AM      Result Value Ref Range   Hemoglobin A1C 6.1 (*) <5.7 %   Comment: (NOTE)                                                                               According to the ADA Clinical Practice Recommendations for 2011, when     HbA1c is used as a screening test:      >=6.5%   Diagnostic of Diabetes Mellitus               (if abnormal result is confirmed)     5.7-6.4%   Increased risk of developing Diabetes Mellitus     References:Diagnosis and Classification of Diabetes Mellitus,Diabetes     MBWG,6659,93(TTSVX 1):S62-S69 and Standards of Medical Care in             Diabetes - 2011,Diabetes Care,2011,34 (Suppl 1):S11-S61.   Mean Plasma Glucose 128 (*) <117 mg/dL   Comment: Performed at [facility]     Status: Abnormal   Collection Time    02/22/14  2:00 AM      Result Value Ref Range   Prothrombin Time 18.8 (*) 11.6 - 15.2 seconds   INR 1.57 (*) 0.00 - 1.49  GLUCOSE, CAPILLARY     Status: Abnormal   Collection Time    02/22/14  7:42 AM      Result Value Ref Range   Glucose-Capillary 159 (*) 70 - 99 mg/dL   Comment 1 Documented in Chart     Comment 2 Notify RN    CBC     Status: Abnormal   Collection Time    02/22/14 10:30 AM      Result Value Ref Range   WBC 10.7 (*) 4.0 - 10.5 K/uL   Comment: WHITE  COUNT CONFIRMED ON SMEAR   RBC 2.27 (*) 3.87 - 5.11 MIL/uL   Hemoglobin 6.4 (*) 12.0 - 15.0 g/dL   Comment: QUANTITY NOT SUFFICIENT TO REPEAT TEST     CRITICAL RESULT CALLED TO, READ BACK BY AND VERIFIED WITH:     STROUPE,K RN AT 1112 06.29.2015 BY LINEBERRY,R   HCT 19.5 (*) 36.0 - 46.0 %   MCV 85.9  78.0 - 100.0 fL   MCH 28.2  26.0 - 34.0 pg   MCHC 32.8  30.0 - 36.0 g/dL   RDW 16.1 (*) 11.5 - 15.5 %   Platelets    150 - 400 K/uL   Value: PLATELET CLUMPS NOTED ON SMEAR, COUNT APPEARS ADEQUATE   Comment: SPECIMEN CHECKED FOR CLOTS  PROTIME-INR     Status: Abnormal   Collection Time    02/22/14 10:30 AM      Result Value Ref Range   Prothrombin Time 17.1 (*) 11.6 - 15.2 seconds   INR 1.39  0.00 - 1.49  PREPARE RBC (CROSSMATCH)     Status: None   Collection Time    02/22/14 11:30 AM      Result Value Ref Range   Order Confirmation ORDER PROCESSED BY BLOOD BANK    GLUCOSE, CAPILLARY     Status: Abnormal   Collection Time    02/22/14 12:34 PM  Result Value Ref Range   Glucose-Capillary 165 (*) 70 - 99 mg/dL   Comment 1 Documented in Chart     Comment 2 Notify RN    GLUCOSE, CAPILLARY     Status: Abnormal   Collection Time    02/22/14  5:02 PM      Result Value Ref Range   Glucose-Capillary 137 (*) 70 - 99 mg/dL   Comment 1 Documented in Chart     Comment 2 Notify RN      Dg Chest Port 1 View  02/20/2014   CLINICAL DATA:  Fever.  Weakness.  EXAM: PORTABLE CHEST - 1 VIEW  COMPARISON:  11/17/2012  FINDINGS: No cardiomegaly for technique. Unchanged prominence of the pulmonary vasculature. No edema, consolidation, effusion, or pneumothorax. No acute osseous findings. Degenerative thoracic endplate spurring.  IMPRESSION: No definitive pneumonia.   Electronically Signed   By: Jorje Guild M.D.   On: 02/20/2014 23:51    Review of Systems  HENT: Negative.   Eyes: Negative.   Respiratory: Negative.   Cardiovascular: Negative.   Gastrointestinal: Negative.   Genitourinary:  Negative.   Musculoskeletal: Positive for joint pain.  Neurological: Positive for weakness.  Psychiatric/Behavioral: Negative.    Blood pressure 103/33, pulse 80, temperature 98.5 F (36.9 C), temperature source Axillary, resp. rate 17, height 5' 5"  (1.651 m), weight 147.873 kg (326 lb), SpO2 96.00%. Physical Exam  Constitutional: She is oriented to person, place, and time. She appears well-developed and well-nourished.  HENT:  Head: Normocephalic and atraumatic.  Eyes: Conjunctivae and EOM are normal. Pupils are equal, round, and reactive to light.  Cardiovascular: Normal rate.   Respiratory: Effort normal.  Musculoskeletal:       Legs: Neurological: She is alert and oriented to person, place, and time.  Psychiatric: She has a normal mood and affect. Her behavior is normal.    Assessment/Plan: Plan for OR tomorrow for evacuation of hematoma and possible ACell and VAC placement.  SANGER,CLAIRE 02/22/2014, 6:07 PM

## 2014-02-24 NOTE — Interval H&P Note (Signed)
History and Physical Interval Note:  02/24/2014 8:13 AM  Kelly [name]  has presented today for surgery, with the diagnosis of left leg blood clot  The various methods of treatment have been discussed with the patient and family. After consideration of risks, benefits and other options for treatment, the patient has consented to  Procedure(s): MINOR INCISION AND DRAINAGE OF ABSCESS (Left) as a surgical intervention .  The patient's history has been reviewed, patient examined, no change in status, stable for surgery.  I have reviewed the patient's chart and labs.  Questions were answered to the patient's satisfaction.     SANGER,Emaad Nanna

## 2014-02-24 NOTE — Progress Notes (Signed)
CARE MANAGEMENT NOTE 02/24/2014  Patient:  Kelly [name], Kelly [name]   Account Number:  [id number]  Date Initiated:  02/24/2014  Documentation initiated by:  Treshon Stannard  Subjective/Objective Assessment:   patiebnt with complete dehisscence of a old upper left thigh incision, uro sepsis,     Action/Plan:   from [place], morbid obesity(331 lbs), dvt of the left leg, diabetic   Anticipated DC Date:  02/27/2014   Anticipated DC Plan:  SKILLED NURSING FACILITY  In-house referral  Clinical Social Worker      DC Planning Services  NA      [facility] Choice  NA   Choice offered to / List presented to:  NA      DME agency  NA        [place] agency  NA   Status of service:  In process, will continue to follow Medicare Important Message given?  NA - LOS <3 / Initial given by admissions (If response is "NO", the following Medicare IM given date fields will be blank) Date Medicare IM given:   Medicare IM given by:   Date Additional Medicare IM given:   Additional Medicare IM given by:    Discharge Disposition:    Per UR Regulation:  Reviewed for med. necessity/level of care/duration of stay  If discussed at [place] of Stay Meetings, dates discussed:    Comments:  [id number]/Khyree Carillo Eldridge Dace, [place], [place] [phone] Chart Reviewed for discharge and hospital needs. Discharge needs at time of review: None present will follow for needs. IM UPDATE NEEDED ON [id number] IF REMAINS INPATIENT Review of patient progress due on [id number].

## 2014-02-24 NOTE — Anesthesia Preprocedure Evaluation (Addendum)
Anesthesia Evaluation  Patient identified by MRN, date of birth, ID band Patient awake    Reviewed: Allergy & Precautions, H&P , NPO status , Patient's Chart, lab work & pertinent test results, reviewed documented beta blocker date and time   Airway Mallampati: III TM Distance: >3 FB Neck ROM: full    Dental  (+) Missing, Dental Advisory Given All upper front misiing:   Pulmonary sleep apnea , former smoker,  PE 6/15. IFC filter breath sounds clear to auscultation  Pulmonary exam normal       Cardiovascular Exercise Tolerance: Poor hypertension, Pt. on medications and Pt. on home beta blockers +CHF Rhythm:regular Rate:Normal  Chronic diastolic heart failure.  Severe sepsis recently   Neuro/Psych negative neurological ROS  negative psych ROS   GI/Hepatic negative GI ROS, Neg liver ROS, GERD-  Medicated and Controlled,  Endo/Other  diabetes, Poorly Controlled, Type 2, Oral Hypoglycemic AgentsMorbid obesity  Renal/GU ARF and CRFRenal diseasenegative Renal ROSStage 3 kidney disease  negative genitourinary   Musculoskeletal   Abdominal (+) + obese,   Peds  Hematology negative hematology ROS (+) anemia , hgb 8.8   Anesthesia Other Findings   Reproductive/Obstetrics negative OB ROS                          Anesthesia Physical Anesthesia Plan  ASA: IV  Anesthesia Plan: General   Post-op Pain Management:    Induction: Intravenous  Airway Management Planned: Oral ETT  Additional Equipment:   Intra-op Plan:   Post-operative Plan: Extubation in OR  Informed Consent: I have reviewed the patients History and Physical, chart, labs and discussed the procedure including the risks, benefits and alternatives for the proposed anesthesia with the patient or authorized representative who has indicated his/her understanding and acceptance.   Dental Advisory Given  Plan Discussed with: CRNA and  Surgeon  Anesthesia Plan Comments:        Anesthesia Quick Evaluation

## 2014-02-24 NOTE — Progress Notes (Signed)
ANTIBIOTIC CONSULT NOTE - INITIAL  Pharmacy Consult for Primaxin Indication: pyelonephritis w/sepsis  Allergies  Allergen Reactions  . Penicillins Hives    Patient Measurements: Height: 5\' 5"  (165.1 cm) Weight: 331 lb (150.141 kg) IBW/kg (Calculated) : 57   Vital Signs: Temp: 99 F (37.2 C) (07/01 0400) Temp src: Oral (07/01 0400) BP: 139/35 mmHg (07/01 0400) Intake/Output from previous day: 06/30 0701 - 07/01 0700 In: 850 [P.O.:250; IV Piggyback:600] Out: 1025 [Urine:1025] Intake/Output from this shift:    Labs:  Recent Labs  02/22/14 0200 02/22/14 1030 02/23/14 0330 02/24/14 0335  WBC 13.8* 10.7* 11.6* 10.1  HGB 8.2* 6.4* 8.9* 8.8*  PLT 287 PLATELET CLUMPS NOTED ON SMEAR, COUNT APPEARS ADEQUATE 224 266  CREATININE 1.60*  --  1.56* 1.31*   Estimated Creatinine Clearance: 62.8 ml/min (by C-G formula based on Cr of 1.31).    Microbiology: Recent Results (from the past 720 hour(s))  CULTURE, BLOOD (ROUTINE X 2)     Status: None   Collection Time    02/20/14 11:06 PM      Result Value Ref Range Status   Specimen Description BLOOD LEFT HAND   Final   Special Requests BOTTLES DRAWN AEROBIC AND ANAEROBIC 3CC   Final   Culture  Setup Time     Final   Value: 02/21/2014 16:18     Performed at [facility]   Culture     Final   Value:        BLOOD CULTURE RECEIVED NO GROWTH TO DATE CULTURE WILL BE HELD FOR 5 DAYS BEFORE ISSUING A FINAL NEGATIVE REPORT     Performed at [facility]   Report Status PENDING   Incomplete  CULTURE, BLOOD (ROUTINE X 2)     Status: None   Collection Time    02/20/14 11:35 PM      Result Value Ref Range Status   Specimen Description BLOOD LEFT ANTECUBITAL   Final   Special Requests BOTTLES DRAWN AEROBIC AND ANAEROBIC 5CC   Final   Culture  Setup Time     Final   Value: 02/21/2014 16:18     Performed at [facility]   Culture     Final   Value:        BLOOD CULTURE RECEIVED NO GROWTH TO DATE CULTURE WILL BE  HELD FOR 5 DAYS BEFORE ISSUING A FINAL NEGATIVE REPORT     Performed at [facility]   Report Status PENDING   Incomplete  URINE CULTURE     Status: None   Collection Time    02/21/14  3:21 AM      Result Value Ref Range Status   Specimen Description URINE, RANDOM   Final   Special Requests NONE   Final   Culture  Setup Time     Final   Value: 02/21/2014 16:29     Performed at [place]     Final   Value: >=100,000 COLONIES/ML     Performed at [facility]   Culture     Final   Value: ESCHERICHIA COLI     Performed at [facility]   Report Status 02/24/2014 FINAL   Final   Organism ID, Bacteria ESCHERICHIA COLI   Final  MRSA PCR SCREENING     Status: None   Collection Time    02/21/14  5:37 AM      Result Value Ref Range Status   MRSA by  PCR NEGATIVE  NEGATIVE Final   Comment:            The GeneXpert MRSA Assay (FDA     approved for NASAL specimens     only), is one component of a     comprehensive MRSA colonization     surveillance program. It is not     intended to diagnose MRSA     infection nor to guide or     monitor treatment for     MRSA infections.    Medical History: Past Medical History  Diagnosis Date  . Hypertension   . Diabetes mellitus   . Hyperlipidemia   . CHF (congestive heart failure)   . GERD (gastroesophageal reflux disease)   . Depression   . Sleep apnea     doesn't use cpap machine or 02 at night  . Headache(784.0)     Migraines  . Arthritis     knees  . Anemia     Had blood transfusion x 2 in yhe 90s  . Lymphedema of leg   . Pulmonary embolism 02/18/14    diagnosed at [facility] with CT angio chest  . Chronic indwelling Foley catheter     Medications:  Scheduled:  . allopurinol  100 mg Oral Daily  . aspirin  325 mg Oral Daily  . atorvastatin  40 mg Oral q1800  . feeding supplement (GLUCERNA SHAKE)  237 mL Oral TID BM  . gabapentin  400 mg Oral BID  . imipenem-cilastatin  500 mg  Intravenous Q8H  . insulin aspart  0-5 Units Subcutaneous QHS  . insulin aspart  0-9 Units Subcutaneous TID WC  . pantoprazole  80 mg Oral Q1200  . pneumococcal 23 valent vaccine  0.5 mL Intramuscular Tomorrow-1000  . polyethylene glycol  17 g Oral Daily  . sodium chloride  3 mL Intravenous Q12H   Infusions:   PRN: acetaminophen, acetaminophen, alum & mag hydroxide-simeth, bisacodyl, morphine injection, ondansetron (ZOFRAN) IV, ondansetron, oxyCODONE, polyethylene glycol, promethazine, zolpidem  Assessment: 68 y/o F with pyelonephritis and sepsis, now on D#4 empiric cefepime + vancomycin.  Urine culture growing >100K E.Coli, R to cefazolin / quinolones / gentamicin.  S to Pip/tazo but patient is allergic to penicilliln (hives).  Isolate not tested vs cefepime.  Changing therapy to imipenem for now with pharmacy dosing assistance.  Goal of Therapy:  Appropriate antibiotic dosing for renal function; eradication of infection.   Plan:  1. Begin Primaxin 500 mg IV q8h 2. Unless additional broad-spectrum empiric coverage needed for surgical wounds or some other indication, consider de-escalation to ceftriaxone when clinically appropriate. 3. Follow serum creatinine, clinical course.  Clayburn Pert, PharmD, BCPS Pager: [phone] 02/24/2014  7:59 AM

## 2014-02-24 NOTE — Progress Notes (Signed)
Clinical Social Work  CSW received a call from [employer] inquiring about patient's status. CSW agreeable to keep SNF updated on patient's DC plans.  Sindy Messing, LCSW (Coverage for [employer])

## 2014-02-24 NOTE — Op Note (Signed)
Operative Note   DATE OF OPERATION: 02/24/2014  LOCATION: [place]  SURGICAL DIVISION: Plastic Surgery  PREOPERATIVE DIAGNOSES:  Large left leg wound  POSTOPERATIVE DIAGNOSES:  same  PROCEDURE:  Evacuation of left leg hematoma with Acell (4 gm powder and 10 x 15 cm)and VAC placement (18 x 8 x 9 cm)  SURGEON: Amaya Blakeman Sanger, DO  ANESTHESIA:  General.   COMPLICATIONS: None.   INDICATIONS FOR PROCEDURE:  The patient, Kelly [name] is a 68 y.o. female born on [birth year], is here for treatment of left leg wound. MRN: [id number]  CONSENT:  Informed consent was obtained directly from the patient. Risks, benefits and alternatives were fully discussed. Specific risks including but not limited to bleeding, infection, hematoma, seroma, scarring, pain, infection, contracture, asymmetry, wound healing problems, and need for further surgery were all discussed. The patient did have an ample opportunity to have questions answered to satisfaction.   DESCRIPTION OF PROCEDURE:  The patient was taken to the operating room. The patient's operative site was prepped and draped in a sterile fashion. A time out was performed and all information was confirmed to be correct.  General anesthesia was administered.  The area was irrigated with large amounts of antibiotic solution and warm saline.  Hemostasis was achieved with electrocautery.  The Acell powder was placed followed by the Acell sheet.  The adaptic was applied and the VAC.  There was an excellent seal.  The patient tolerated the procedure well.  There were no complications. The patient was allowed to wake from anesthesia, extubated and taken to the recovery room in satisfactory condition.

## 2014-02-24 NOTE — Progress Notes (Signed)
[facility] PROGRESS NOTE  CHERYEL KYTE XBD:[id number] DOB: 04-19-1946 DOA: 02/20/2014 PCP: Alvester Chou, NP  Brief Narrative: [name] with recent history of left lower extremity debridement of wound as well as panniculectomy one month ago at [facility] by Dr. Luetta Nutting (plastic surgery). Postoperatively developed PE and was placed on anticoagulation. Was also discharged with Foley catheter in place to avoid urine getting on the left lower extremity wound. Patient developed sepsis secondary to urinary tract source. Was initially placed on IV antibiotics and her hospital course has been complicated by left lower extremity bleeding as patient required anticoagulation to protect her from pulmonary embolism.  As a result of continued left lower extremity bleeding patient has required multiple transfusions and currently is awaiting revision in OR today 02/23/2014 of which no further anticoagulation is recommended by surgeon. Had IVC filter placed 6/30.   Late Entry: Patient seen earlier today while in PACU   Assessment/Plan: Sepsis associated hypotension - Resolved with IVF's and antibiotics as well as cessation of bleeding from lower extremity after discontinuation of anticoagulants.    Severe sepsis -  Source most likely urinary - Urine cx with E Coli with mult resistance pattern: will change abx to imipenem. - monitor closely in ICU -Sepsis parameters improved.    Diabetes mellitus type 2 with neurological manifestations - SSI and diabetic diet (NPO currently for OR by surgeon- continue diabetic diet once patient able to eat)    Chronic diastolic heart failure - given hypotension will hold medications which may decrease her blood pressures - gentle fluid hydration. -Appears compensated at present.    Pulmonary embolism - Pt has required multiple blood transfusions secondary to persistent bleeding from left lower extremity. As a result anticoagulation has been  held. We were unable to continue anticoagulation as patient's hemoglobin was below 8.0 yesterday with reports of continued bleeding. - Case was discussed with oncology/hematology and recommendations are for [place] filter. Case also discussed with surgeon states at this juncture anticoagulation is not recommended from her standpoint. -Had IVC filter placed. No plans for continued anticoagulation at this point.  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 Theodoro Kos associate of Dr. Luetta Nutting (surgeon who performed recent LLE debridement). Would like to thank Dr. Migdalia Dk for her assistance with this case. Plan is to take patient to the OR today 02/24/2014     UTI (urinary tract infection) due to urinary indwelling Foley catheter -See above for details.  Morbid obesity - Registered dietitian on board   Code Status: full Family Communication: None Disposition Plan: To be determined   Consultants:  Plastic Surgery: Dr. Migdalia Dk  Pharmacy  Interventional radiology   Antibiotics:  imipenem  HPI/Subjective:  Still groggy s/p surgery today.  Objective: Filed Vitals:   02/24/14 1200  BP:   Pulse:   Temp: 98.2 F (36.8 C)  Resp:     Intake/Output Summary (Last 24 hours) at 02/24/14 1614 Last data filed at 02/24/14 1441  Gross per 24 hour  Intake   1450 ml  Output   1775 ml  Net   -325 ml   Filed Weights   02/21/14 0445 02/22/14 0500 02/23/14 1000  Weight: 147.873 kg (326 lb) 147.873 kg (326 lb) 150.141 kg (331 lb)    Exam:   General:  Pt in NAD, alert and awake  Cardiovascular: RRR, no MRG  Respiratory: CTA BL, no wheezes, no increased wob  Abdomen: soft, NT, ND, obese  Musculoskeletal: no cyanosis or clubbing, guaze in place over LLE   Data Reviewed: Basic Metabolic Panel:  Recent Labs Lab 02/20/14 2335 02/21/14 0530  02/22/14 0200 02/23/14 0330 02/24/14 0335  NA 135* 136* 134* 135* 137  K 5.3 5.5* 5.3 5.4* 5.2  CL 102 104 103 106 109  CO2 20 19 18* 20 18*  GLUCOSE 174* 171* 181* 194* 180*  BUN 24* 26* 31* 35* 31*  CREATININE 1.42* 1.62* 1.60* 1.56* 1.31*  CALCIUM 8.1* 8.0* 7.6* 7.3* 7.8*   Liver Function Tests:  Recent Labs Lab 02/20/14 2335  AST 23  ALT 25  ALKPHOS 153*  BILITOT 0.2*  PROT 7.0  ALBUMIN 1.9*   No results found for this basename: LIPASE, AMYLASE,  in the last 168 hours No results found for this basename: AMMONIA,  in the last 168 hours CBC:  Recent Labs Lab 02/20/14 2335  02/21/14 1616 02/22/14 0200 02/22/14 1030 02/23/14 0330 02/24/14 0335  WBC 13.3*  < > 12.9* 13.8* 10.7* 11.6* 10.1  NEUTROABS 12.2*  --   --   --   --   --   --   HGB 9.0*  < > 6.4* 8.2* 6.4* 8.9* 8.8*  HCT 28.0*  < > 20.2* 24.1* 19.5* 26.6* 26.2*  MCV 89.7  < > 91.0 87.6 85.9 86.6 88.2  PLT 370  < > 271 287 PLATELET CLUMPS NOTED ON SMEAR, COUNT APPEARS ADEQUATE 224 266  < > = values in this interval not displayed. Cardiac Enzymes: No results found for this basename: CKTOTAL, CKMB, CKMBINDEX, TROPONINI,  in the last 168 hours BNP (last 3 results) No results found for this basename: PROBNP,  in the last 8760 hours CBG:  Recent Labs Lab 02/23/14 1732 02/23/14 2123 02/24/14 0718 02/24/14 1034 02/24/14 1201  GLUCAP 98 171* 145* 120* 126*    Recent Results (from the past 240 hour(s))  CULTURE, BLOOD (ROUTINE X 2)     Status: None   Collection Time    02/20/14 11:06 PM      Result Value Ref Range Status   Specimen Description BLOOD LEFT HAND   Final   Special Requests BOTTLES DRAWN AEROBIC AND ANAEROBIC 3CC   Final   Culture  Setup Time     Final   Value: 02/21/2014 16:18     Performed at [facility]   Culture     Final   Value:        BLOOD CULTURE RECEIVED NO GROWTH TO DATE CULTURE WILL BE HELD FOR 5 DAYS BEFORE ISSUING A FINAL NEGATIVE REPORT     Performed at [facility]   Report Status PENDING   Incomplete  CULTURE, BLOOD (ROUTINE X 2)     Status: None   Collection Time    02/20/14 11:35 PM      Result Value Ref Range Status   Specimen Description BLOOD LEFT ANTECUBITAL   Final   Special Requests BOTTLES DRAWN AEROBIC AND ANAEROBIC 5CC   Final   Culture  Setup Time     Final   Value: 02/21/2014 16:18     Performed at [facility]   Culture     Final   Value:        BLOOD CULTURE RECEIVED NO GROWTH TO DATE CULTURE WILL BE HELD FOR 5 DAYS BEFORE ISSUING A FINAL NEGATIVE REPORT     Performed at [facility]   Report Status PENDING   Incomplete  URINE CULTURE     Status: None   Collection Time    02/21/14  3:21 AM      Result Value Ref Range Status   Specimen Description URINE, RANDOM   Final   Special Requests NONE   Final   Culture  Setup Time     Final   Value: 02/21/2014 16:29     Performed at [place]     Final   Value: >=100,000 COLONIES/ML     Performed at [facility]   Culture     Final   Value: ESCHERICHIA COLI     Performed at [facility]   Report Status 02/24/2014 FINAL   Final   Organism ID, Bacteria ESCHERICHIA COLI   Final  MRSA PCR SCREENING     Status: None   Collection Time    02/21/14  5:37 AM      Result Value Ref Range Status   MRSA by PCR NEGATIVE  NEGATIVE Final   Comment:            The GeneXpert MRSA Assay (FDA     approved for NASAL specimens     only), is one component of a     comprehensive MRSA colonization     surveillance program. It is not     intended to diagnose MRSA     infection nor to guide or     monitor treatment for     MRSA infections.  SURGICAL PCR SCREEN     Status: None   Collection Time    02/24/14  8:40 AM      Result Value Ref Range Status   MRSA, PCR NEGATIVE  NEGATIVE Final   Staphylococcus aureus NEGATIVE  NEGATIVE Final   Comment:            The Xpert SA Assay (FDA     approved for NASAL specimens     in  patients over 71 years of age),     is one component of     a comprehensive surveillance     program.  Test performance has     been validated by [employer] for patients greater     than or equal to 86 year old.     It is not intended     to diagnose infection nor to     guide or monitor treatment.     Performed at [facility]     Studies: Ir Ivc Filter Plmt / S&i /img Guid/mod Sed  02/23/2014   CLINICAL DATA:  History of pulmonary embolism. Anticoagulation cannot be continued due to bleeding from the left lower extremity. Request is been made to place an IVC filter.  EXAM: 1. ULTRASOUND GUIDANCE FOR VASCULAR ACCESS OF THE RIGHT INTERNAL JUGULAR VEIN. 2. IVC VENOGRAM. 3. PERCUTANEOUS IVC FILTER PLACEMENT.  ANESTHESIA/SEDATION: 2.0 mg IV Versed; 100 mcg IV Fentanyl.  Total Moderate Sedation Time  63minutes.  CONTRAST:  CO2  FLUOROSCOPY TIME:  48 seconds.  PROCEDURE: The procedure, risks, benefits, and alternatives were explained to the patient. Questions regarding the procedure were encouraged and answered. The patient understands and consents to the procedure.  The right neck was prepped with Betadine in a sterile fashion, and a sterile drape was applied covering the operative field. A sterile gown and sterile gloves were used for the procedure. Local anesthesia was  provided with 1% Lidocaine.  Ultrasound was used to confirm patency of the right internal jugular vein. Under direct ultrasound guidance, a 21 gauge needle was advanced into the right internal jugular vein with ultrasound image documentation performed. After securing access with a micropuncture dilator, a guidewire was advanced into the inferior vena cava. A deployment sheath was advanced over the guidewire. This was utilized to perform IVC venography.  The deployment sheath was further positioned in an appropriate location for filter deployment. A Bard Denali IVC filter was then advanced in the sheath. This was then fully  deployed in the infrarenal IVC. Final filter position was confirmed with a fluoroscopic spot image. Contrast injection was also performed through the sheath under fluoroscopy to confirm patency of the IVC at the level of the filter. After the procedure the sheath was removed and hemostasis obtained with manual compression.  COMPLICATIONS: None.  FINDINGS: IVC venography demonstrates a normal caliber IVC with no evidence of thrombus. Renal veins are identified bilaterally. The IVC filter was successfully positioned below the level of the renal veins and is appropriately oriented. This IVC filter has both permanent and retrievable indications.  IMPRESSION: Placement of percutaneous IVC filter in infrarenal IVC. IVC venogram shows no evidence of IVC thrombus and normal caliber of the inferior vena cava. This filter does have both permanent and retrievable indications.   Electronically Signed   By: Aletta Edouard M.D.   On: 02/23/2014 17:19    Scheduled Meds: . allopurinol  100 mg Oral Daily  . aspirin  325 mg Oral Daily  . atorvastatin  40 mg Oral q1800  . feeding supplement (GLUCERNA SHAKE)  237 mL Oral TID BM  . gabapentin  400 mg Oral BID  . HYDROmorphone      . HYDROmorphone      . imipenem-cilastatin  500 mg Intravenous Q8H  . insulin aspart  0-5 Units Subcutaneous QHS  . insulin aspart  0-9 Units Subcutaneous TID WC  . pantoprazole  80 mg Oral Q1200  . pneumococcal 23 valent vaccine  0.5 mL Intramuscular Tomorrow-1000  . polyethylene glycol  17 g Oral Daily  . sodium chloride  3 mL Intravenous Q12H   Continuous Infusions: . lactated ringers        Time spent: 35 minutes of critical care time    [facility] Pager [phone]  If 7PM-7AM, please contact night-coverage at [email or web address], password [facility] 02/24/2014, 4:14 PM  LOS: 4 days

## 2014-02-24 NOTE — OR Nursing (Signed)
Note that part of OR charting for 02/24/2014 irrigation and debridement case was completed after the case by this RN due to complicated case with insufficient time to appropriately document during case.

## 2014-02-24 NOTE — Brief Op Note (Signed)
02/20/2014 - 02/24/2014  10:06 AM  PATIENT:  Kelly [name]  68 y.o. female  PRE-OPERATIVE DIAGNOSIS:  left leg wound  POST-OPERATIVE DIAGNOSIS:  Left leg wound (20 x 8 x 9 cm)  PROCEDURE:  Procedure(s): MINOR INCISION AND DRAINAGE OF ABSCESS (Left) with Acell and VAC placement  SURGEON:  Surgeon(s) and Role:    * Blima Jaimes Sanger, DO - Primary  ASSISTANTS: none   ANESTHESIA:   general  EBL:  Total I/O In: -  Out: 150 [Urine:150]  BLOOD ADMINISTERED:none  DRAINS: none   LOCAL MEDICATIONS USED:  NONE  SPECIMEN:  No Specimen  DISPOSITION OF SPECIMEN:  N/A  COUNTS:  YES  TOURNIQUET:  * No tourniquets in log *  DICTATION: .Dragon Dictation  PLAN OF CARE: Admit to inpatient   PATIENT DISPOSITION:  PACU - hemodynamically stable.   Delay start of Pharmacological VTE agent (>24hrs) due to surgical blood loss or risk of bleeding: no

## 2014-02-25 ENCOUNTER — Encounter (HOSPITAL_COMMUNITY): Payer: Self-pay | Admitting: Plastic Surgery

## 2014-02-25 LAB — GLUCOSE, CAPILLARY
GLUCOSE-CAPILLARY: 116 mg/dL — AB (ref 70–99)
Glucose-Capillary: 151 mg/dL — ABNORMAL HIGH (ref 70–99)
Glucose-Capillary: 155 mg/dL — ABNORMAL HIGH (ref 70–99)
Glucose-Capillary: 133 mg/dL — ABNORMAL HIGH (ref 70–99)

## 2014-02-25 LAB — BASIC METABOLIC PANEL
Anion gap: 8 (ref 5–15)
BUN: 31 mg/dL — ABNORMAL HIGH (ref 6–23)
CO2: 20 meq/L (ref 19–32)
Calcium: 7.7 mg/dL — ABNORMAL LOW (ref 8.4–10.5)
Chloride: 108 mEq/L (ref 96–112)
Creatinine, Ser: 1.42 mg/dL — ABNORMAL HIGH (ref 0.50–1.10)
GFR calc Af Amer: 44 mL/min — ABNORMAL LOW (ref >90)
GFR calc non Af Amer: 38 mL/min — ABNORMAL LOW (ref >90)
GLUCOSE: 146 mg/dL — AB (ref 70–99)
Potassium: 5.8 mEq/L — ABNORMAL HIGH (ref 3.7–5.3)
SODIUM: 136 meq/L — AB (ref 137–147)

## 2014-02-25 LAB — CBC
HCT: 24.4 % — ABNORMAL LOW (ref 36.0–46.0)
Hemoglobin: 7.9 g/dL — ABNORMAL LOW (ref 12.0–15.0)
MCH: 29.3 pg (ref 26.0–34.0)
MCHC: 32.4 g/dL (ref 30.0–36.0)
MCV: 90.4 fL (ref 78.0–100.0)
PLATELETS: 259 10*3/uL (ref 150–400)
RBC: 2.7 MIL/uL — AB (ref 3.87–5.11)
RDW: 15.3 % (ref 11.5–15.5)
WBC: 10.4 10*3/uL (ref 4.0–10.5)

## 2014-02-25 MED ORDER — SULFAMETHOXAZOLE-TMP DS 800-160 MG PO TABS
1.0000 | ORAL_TABLET | Freq: Two times a day (BID) | ORAL | Status: DC
  Administered 2014-02-25 – 2014-02-26 (×3): 1 via ORAL
  Filled 2014-02-25 (×4): qty 1

## 2014-02-25 NOTE — Progress Notes (Addendum)
[facility] PROGRESS NOTE  Kelly [name] ZDG:[id number] DOB: 08-Jan-1946 DOA: 02/20/2014 PCP: Alvester Chou, NP  Brief Narrative: [name] with recent history of left lower extremity debridement of wound as well as panniculectomy one month ago at [facility] by Dr. Luetta Nutting (plastic surgery). Postoperatively developed PE and was placed on anticoagulation. Was also discharged with Foley catheter in place to avoid urine getting on the left lower extremity wound. Patient developed sepsis secondary to urinary tract source. Was initially placed on IV antibiotics and her hospital course has been complicated by left lower extremity bleeding as patient required anticoagulation to protect her from pulmonary embolism.  As a result of continued left lower extremity bleeding patient has required multiple transfusions and currently is awaiting revision in OR today 02/23/2014 of which no further anticoagulation is recommended by surgeon. Had IVC filter placed 6/30.   Late Entry: Patient seen earlier today while in PACU   Assessment/Plan: Sepsis associated hypotension - Resolved with IVF's and antibiotics as well as cessation of bleeding from lower extremity after discontinuation of anticoagulants.    Severe sepsis -  Source most likely urinary - Urine cx with E Coli. Will change abx to Bactrim. -Sepsis parameters resolved.    Diabetes mellitus type 2 with neurological manifestations - CBGs well controlled.    Chronic diastolic heart failure - given hypotension will hold medications which may decrease her blood pressures -Appears compensated at present.    Pulmonary embolism - Pt has required multiple blood transfusions secondary to persistent bleeding from left lower extremity. As a result anticoagulation has been held. We were unable to continue anticoagulation as patient's hemoglobin was below 8.0 yesterday with reports of continued bleeding. - Case was discussed with  oncology/hematology and recommendations are for [place] filter. Case also discussed with surgeon states at this juncture anticoagulation is not recommended from her standpoint. -Had IVC filter placed. No plans for continued anticoagulation at this point.  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 Theodoro Kos associate of Dr. Luetta Nutting (surgeon who performed recent LLE debridement). Would like to thank Dr. Migdalia Dk for her assistance with this case. Plan is to take patient to the OR today 02/24/2014. -Discussed with Dr. Migdalia Dk 7/2. Plan to take patient to OR once a week but ok to DC from her perspective.     UTI (urinary tract infection) due to urinary indwelling Foley catheter -See above for details.  Morbid obesity - Registered dietitian on board   Code Status: full Family Communication: None Disposition Plan: SNF; likely 24-48 hours.   Consultants:  Plastic Surgery: Dr. Migdalia Dk  Pharmacy  Interventional radiology   Antibiotics:  imipenem  HPI/Subjective: No complaints. Feels well.  Objective: Filed Vitals:   02/25/14 1100  BP: 141/45  Pulse: 79  Temp:   Resp: 14    Intake/Output Summary (Last 24 hours) at 02/25/14 1303 Last data filed at 02/25/14 1200  Gross per 24 hour  Intake    300 ml  Output   1520 ml  Net  -1220 ml   Filed Weights   02/21/14 0445 02/22/14 0500 02/23/14 1000  Weight: 147.873 kg (326 lb) 147.873 kg (326 lb) 150.141 kg (331 lb)    Exam:   General:  Pt in NAD, alert and awake  Cardiovascular: RRR, no MRG  Respiratory: CTA BL, no wheezes, no increased wob  Abdomen:  soft, NT, ND, obese  Musculoskeletal: no cyanosis or clubbing, wound vac in place over inside of left knee.  Data Reviewed: Basic Metabolic Panel:  Recent Labs Lab 02/21/14 0530 02/22/14 0200 02/23/14 0330 02/24/14 0335  02/25/14 0345  NA 136* 134* 135* 137 136*  K 5.5* 5.3 5.4* 5.2 5.8*  CL 104 103 106 109 108  CO2 19 18* 20 18* 20  GLUCOSE 171* 181* 194* 180* 146*  BUN 26* 31* 35* 31* 31*  CREATININE 1.62* 1.60* 1.56* 1.31* 1.42*  CALCIUM 8.0* 7.6* 7.3* 7.8* 7.7*   Liver Function Tests:  Recent Labs Lab 02/20/14 2335  AST 23  ALT 25  ALKPHOS 153*  BILITOT 0.2*  PROT 7.0  ALBUMIN 1.9*   No results found for this basename: LIPASE, AMYLASE,  in the last 168 hours No results found for this basename: AMMONIA,  in the last 168 hours CBC:  Recent Labs Lab 02/20/14 2335  02/22/14 0200 02/22/14 1030 02/23/14 0330 02/24/14 0335 02/25/14 0345  WBC 13.3*  < > 13.8* 10.7* 11.6* 10.1 10.4  NEUTROABS 12.2*  --   --   --   --   --   --   HGB 9.0*  < > 8.2* 6.4* 8.9* 8.8* 7.9*  HCT 28.0*  < > 24.1* 19.5* 26.6* 26.2* 24.4*  MCV 89.7  < > 87.6 85.9 86.6 88.2 90.4  PLT 370  < > 287 PLATELET CLUMPS NOTED ON SMEAR, COUNT APPEARS ADEQUATE 224 266 259  < > = values in this interval not displayed. Cardiac Enzymes: No results found for this basename: CKTOTAL, CKMB, CKMBINDEX, TROPONINI,  in the last 168 hours BNP (last 3 results) No results found for this basename: PROBNP,  in the last 8760 hours CBG:  Recent Labs Lab 02/24/14 1034 02/24/14 1201 02/24/14 1638 02/24/14 2119 02/25/14 0829  GLUCAP 120* 126* 152* 163* 116*    Recent Results (from the past 240 hour(s))  CULTURE, BLOOD (ROUTINE X 2)     Status: None   Collection Time    02/20/14 11:06 PM      Result Value Ref Range Status   Specimen Description BLOOD LEFT HAND   Final   Special Requests BOTTLES DRAWN AEROBIC AND ANAEROBIC 3CC   Final   Culture  Setup Time     Final   Value: 02/21/2014 16:18     Performed at [facility]   Culture     Final   Value:        BLOOD CULTURE RECEIVED NO GROWTH TO DATE CULTURE WILL BE HELD FOR 5 DAYS BEFORE ISSUING A FINAL NEGATIVE REPORT     Performed at [facility]   Report Status  PENDING   Incomplete  CULTURE, BLOOD (ROUTINE X 2)     Status: None   Collection Time    02/20/14 11:35 PM      Result Value Ref Range Status   Specimen Description BLOOD LEFT ANTECUBITAL   Final   Special Requests BOTTLES DRAWN AEROBIC AND ANAEROBIC 5CC   Final   Culture  Setup Time     Final   Value: 02/21/2014 16:18     Performed at [facility]   Culture     Final   Value:        BLOOD CULTURE RECEIVED NO GROWTH TO DATE CULTURE WILL BE HELD FOR 5 DAYS BEFORE ISSUING A FINAL NEGATIVE REPORT     Performed at [facility]   Report Status PENDING  Incomplete  URINE CULTURE     Status: None   Collection Time    02/21/14  3:21 AM      Result Value Ref Range Status   Specimen Description URINE, RANDOM   Final   Special Requests NONE   Final   Culture  Setup Time     Final   Value: 02/21/2014 16:29     Performed at [facility]     Final   Value: >=100,000 COLONIES/ML     Performed at [facility]   Culture     Final   Value: ESCHERICHIA COLI     Performed at [facility]   Report Status 02/24/2014 FINAL   Final   Organism ID, Bacteria ESCHERICHIA COLI   Final  MRSA PCR SCREENING     Status: None   Collection Time    02/21/14  5:37 AM      Result Value Ref Range Status   MRSA by PCR NEGATIVE  NEGATIVE Final   Comment:            The GeneXpert MRSA Assay (FDA     approved for NASAL specimens     only), is one component of a     comprehensive MRSA colonization     surveillance program. It is not     intended to diagnose MRSA     infection nor to guide or     monitor treatment for     MRSA infections.  SURGICAL PCR SCREEN     Status: None   Collection Time    02/24/14  8:40 AM      Result Value Ref Range Status   MRSA, PCR NEGATIVE  NEGATIVE Final   Staphylococcus aureus NEGATIVE  NEGATIVE Final   Comment:            The Xpert SA Assay (FDA     approved for NASAL specimens     in patients over 43 years of age),      is one component of     a comprehensive surveillance     program.  Test performance has     been validated by [employer] for patients greater     than or equal to 72 year old.     It is not intended     to diagnose infection nor to     guide or monitor treatment.     Performed at [facility]     Studies: Ir Ivc Filter Plmt / S&i /img Guid/mod Sed  02/23/2014   CLINICAL DATA:  History of pulmonary embolism. Anticoagulation cannot be continued due to bleeding from the left lower extremity. Request is been made to place an IVC filter.  EXAM: 1. ULTRASOUND GUIDANCE FOR VASCULAR ACCESS OF THE RIGHT INTERNAL JUGULAR VEIN. 2. IVC VENOGRAM. 3. PERCUTANEOUS IVC FILTER PLACEMENT.  ANESTHESIA/SEDATION: 2.0 mg IV Versed; 100 mcg IV Fentanyl.  Total Moderate Sedation Time  58minutes.  CONTRAST:  CO2  FLUOROSCOPY TIME:  48 seconds.  PROCEDURE: The procedure, risks, benefits, and alternatives were explained to the patient. Questions regarding the procedure were encouraged and answered. The patient understands and consents to the procedure.  The right neck was prepped with Betadine in a sterile fashion, and a sterile drape was applied covering the operative field. A sterile gown and sterile gloves were used for the procedure. Local anesthesia was provided with 1% Lidocaine.  Ultrasound was used to  confirm patency of the right internal jugular vein. Under direct ultrasound guidance, a 21 gauge needle was advanced into the right internal jugular vein with ultrasound image documentation performed. After securing access with a micropuncture dilator, a guidewire was advanced into the inferior vena cava. A deployment sheath was advanced over the guidewire. This was utilized to perform IVC venography.  The deployment sheath was further positioned in an appropriate location for filter deployment. A Bard Denali IVC filter was then advanced in the sheath. This was then fully deployed in the infrarenal IVC. Final  filter position was confirmed with a fluoroscopic spot image. Contrast injection was also performed through the sheath under fluoroscopy to confirm patency of the IVC at the level of the filter. After the procedure the sheath was removed and hemostasis obtained with manual compression.  COMPLICATIONS: None.  FINDINGS: IVC venography demonstrates a normal caliber IVC with no evidence of thrombus. Renal veins are identified bilaterally. The IVC filter was successfully positioned below the level of the renal veins and is appropriately oriented. This IVC filter has both permanent and retrievable indications.  IMPRESSION: Placement of percutaneous IVC filter in infrarenal IVC. IVC venogram shows no evidence of IVC thrombus and normal caliber of the inferior vena cava. This filter does have both permanent and retrievable indications.   Electronically Signed   By: Aletta Edouard M.D.   On: 02/23/2014 17:19    Scheduled Meds: . allopurinol  100 mg Oral Daily  . aspirin  325 mg Oral Daily  . atorvastatin  40 mg Oral q1800  . feeding supplement (GLUCERNA SHAKE)  237 mL Oral TID BM  . gabapentin  400 mg Oral BID  . imipenem-cilastatin  500 mg Intravenous Q8H  . insulin aspart  0-5 Units Subcutaneous QHS  . insulin aspart  0-9 Units Subcutaneous TID WC  . pantoprazole  80 mg Oral Q1200  . pneumococcal 23 valent vaccine  0.5 mL Intramuscular Tomorrow-1000  . polyethylene glycol  17 g Oral Daily  . sodium chloride  3 mL Intravenous Q12H   Continuous Infusions: . lactated ringers        Time spent: 35 minutes.    Lelon Frohlich  [facility] Pager [phone]  If 7PM-7AM, please contact night-coverage at [email or web address], password [facility] 02/25/2014, 1:03 PM  LOS: 5 days

## 2014-02-25 NOTE — Progress Notes (Signed)
1 Day Post-Op  Subjective: The patient is doing well overall and her pain is much improved with the VAC on the wound.  No sign of infection.  Objective: Vital signs in last 24 hours: Temp:  [98.4 F (36.9 C)-99.9 F (37.7 C)] 98.5 F (36.9 C) (07/02 1100) Pulse Rate:  [51-96] 77 (07/02 1603) Resp:  [13-23] 13 (07/02 1603) BP: (96-179)/(30-127) 96/30 mmHg (07/02 1603) SpO2:  [93 %-100 %] 100 % (07/02 1603) Last BM Date: 02/22/14  Intake/Output from previous day: 07/01 0701 - 07/02 0700 In: 800 [I.V.:500; IV Piggyback:300] Out: 1675 [Urine:875; Drains:600; Blood:200] Intake/Output this shift: Total I/O In: 100 [IV Piggyback:100] Out: 575 [Urine:375; Drains:200]  General appearance: alert, cooperative and no distress  Lab Results:   Recent Labs  02/24/14 0335 02/25/14 0345  WBC 10.1 10.4  HGB 8.8* 7.9*  HCT 26.2* 24.4*  PLT 266 259   BMET  Recent Labs  02/24/14 0335 02/25/14 0345  NA 137 136*  K 5.2 5.8*  CL 109 108  CO2 18* 20  GLUCOSE 180* 146*  BUN 31* 31*  CREATININE 1.31* 1.42*  CALCIUM 7.8* 7.7*   PT/INR No results found for this basename: LABPROT, INR,  in the last 72 hours ABG No results found for this basename: PHART, PCO2, PO2, HCO3,  in the last 72 hours  Studies/Results: Ir Ivc Filter Plmt / S&i /img Guid/mod Sed  02/23/2014   CLINICAL DATA:  History of pulmonary embolism. Anticoagulation cannot be continued due to bleeding from the left lower extremity. Request is been made to place an IVC filter.  EXAM: 1. ULTRASOUND GUIDANCE FOR VASCULAR ACCESS OF THE RIGHT INTERNAL JUGULAR VEIN. 2. IVC VENOGRAM. 3. PERCUTANEOUS IVC FILTER PLACEMENT.  ANESTHESIA/SEDATION: 2.0 mg IV Versed; 100 mcg IV Fentanyl.  Total Moderate Sedation Time  50minutes.  CONTRAST:  CO2  FLUOROSCOPY TIME:  48 seconds.  PROCEDURE: The procedure, risks, benefits, and alternatives were explained to the patient. Questions regarding the procedure were encouraged and answered. The  patient understands and consents to the procedure.  The right neck was prepped with Betadine in a sterile fashion, and a sterile drape was applied covering the operative field. A sterile gown and sterile gloves were used for the procedure. Local anesthesia was provided with 1% Lidocaine.  Ultrasound was used to confirm patency of the right internal jugular vein. Under direct ultrasound guidance, a 21 gauge needle was advanced into the right internal jugular vein with ultrasound image documentation performed. After securing access with a micropuncture dilator, a guidewire was advanced into the inferior vena cava. A deployment sheath was advanced over the guidewire. This was utilized to perform IVC venography.  The deployment sheath was further positioned in an appropriate location for filter deployment. A Bard Denali IVC filter was then advanced in the sheath. This was then fully deployed in the infrarenal IVC. Final filter position was confirmed with a fluoroscopic spot image. Contrast injection was also performed through the sheath under fluoroscopy to confirm patency of the IVC at the level of the filter. After the procedure the sheath was removed and hemostasis obtained with manual compression.  COMPLICATIONS: None.  FINDINGS: IVC venography demonstrates a normal caliber IVC with no evidence of thrombus. Renal veins are identified bilaterally. The IVC filter was successfully positioned below the level of the renal veins and is appropriately oriented. This IVC filter has both permanent and retrievable indications.  IMPRESSION: Placement of percutaneous IVC filter in infrarenal IVC. IVC venogram shows no evidence of IVC  thrombus and normal caliber of the inferior vena cava. This filter does have both permanent and retrievable indications.   Electronically Signed   By: Aletta Edouard M.D.   On: 02/23/2014 17:19    Anti-infectives: Anti-infectives   Start     Dose/Rate Route Frequency Ordered Stop   02/25/14  1315  sulfamethoxazole-trimethoprim (BACTRIM DS) 800-160 MG per tablet 1 tablet     1 tablet Oral Every 12 hours 02/25/14 1313     02/24/14 1000  imipenem-cilastatin (PRIMAXIN) 500 mg in sodium chloride 0.9 % 100 mL IVPB  Status:  Discontinued     500 mg 200 mL/hr over 30 Minutes Intravenous Every 8 hours 02/24/14 0751 02/25/14 1313   02/24/14 0928  polymyxin B 500,000 Units, bacitracin 50,000 Units in sodium chloride irrigation 0.9 % 500 mL irrigation  Status:  Discontinued       As needed 02/24/14 0943 02/24/14 1020   02/21/14 2200  vancomycin (VANCOCIN) 1,750 mg in sodium chloride 0.9 % 500 mL IVPB  Status:  Discontinued     1,750 mg 250 mL/hr over 120 Minutes Intravenous Every 24 hours 02/21/14 0551 02/24/14 0727   02/21/14 1800  ceFEPIme (MAXIPIME) 2 g in dextrose 5 % 50 mL IVPB  Status:  Discontinued     2 g 100 mL/hr over 30 Minutes Intravenous Every 12 hours 02/21/14 0553 02/24/14 0727   02/20/14 2345  vancomycin (VANCOCIN) 1,500 mg in sodium chloride 0.9 % 500 mL IVPB     1,500 mg 250 mL/hr over 120 Minutes Intravenous  Once 02/20/14 2341 02/25/14 0731   02/20/14 2345  ceFEPIme (MAXIPIME) 2 g in dextrose 5 % 50 mL IVPB     2 g 100 mL/hr over 30 Minutes Intravenous  Once 02/20/14 2344 02/21/14 0714      Assessment/Plan: s/p Procedure(s): MINOR INCISION AND DRAINAGE OF ABSCESS (Left) Discharge per primary team. Have the patient on the schedule for VAC change in the OR on Monday [facility].  Can change that to Outpatient if the patient is discharge.  Will plan to do VAC changes in the OR weekly for around one month.  LOS: 5 days    [facility] 02/25/2014

## 2014-02-25 NOTE — Progress Notes (Signed)
CSW spoke with MD and reviewed PN. Transfer off unit is pending. Possible d/c to SNF tomorrow. PN sent to [facility]. They are able to admit [facility]. CSW has contacted pt's nephew Suzzanne Cloud [phone] and update provided. CSW will continue to follow to assist with d/c planning.   Werner Lean LCSW [phone]

## 2014-02-25 NOTE — Progress Notes (Signed)
Received report from [facility], pt arrived unit from ICU. Alert and oriented, able to verbalize needs, MD notified of pt's location. Will continue with current plan of care.

## 2014-02-26 LAB — CBC
HEMATOCRIT: 24.6 % — AB (ref 36.0–46.0)
Hemoglobin: 8.1 g/dL — ABNORMAL LOW (ref 12.0–15.0)
MCH: 29.8 pg (ref 26.0–34.0)
MCHC: 32.9 g/dL (ref 30.0–36.0)
MCV: 90.4 fL (ref 78.0–100.0)
Platelets: 280 10*3/uL (ref 150–400)
RBC: 2.72 MIL/uL — AB (ref 3.87–5.11)
RDW: 15.1 % (ref 11.5–15.5)
WBC: 9 10*3/uL (ref 4.0–10.5)

## 2014-02-26 LAB — BASIC METABOLIC PANEL
Anion gap: 10 (ref 5–15)
BUN: 31 mg/dL — ABNORMAL HIGH (ref 6–23)
CO2: 19 meq/L (ref 19–32)
Calcium: 7.9 mg/dL — ABNORMAL LOW (ref 8.4–10.5)
Chloride: 107 mEq/L (ref 96–112)
Creatinine, Ser: 1.38 mg/dL — ABNORMAL HIGH (ref 0.50–1.10)
GFR calc Af Amer: 45 mL/min — ABNORMAL LOW (ref >90)
GFR calc non Af Amer: 39 mL/min — ABNORMAL LOW (ref >90)
Glucose, Bld: 106 mg/dL — ABNORMAL HIGH (ref 70–99)
POTASSIUM: 5.7 meq/L — AB (ref 3.7–5.3)
SODIUM: 136 meq/L — AB (ref 137–147)

## 2014-02-26 LAB — GLUCOSE, CAPILLARY: GLUCOSE-CAPILLARY: 145 mg/dL — AB (ref 70–99)

## 2014-02-26 MED ORDER — OXYCODONE HCL 5 MG PO TABS: 5.0000 mg | ORAL_TABLET | ORAL | Status: DC | PRN

## 2014-02-26 MED ORDER — SULFAMETHOXAZOLE-TMP DS 800-160 MG PO TABS: 1.0000 | ORAL_TABLET | Freq: Two times a day (BID) | ORAL | Status: DC

## 2014-02-26 NOTE — Progress Notes (Signed)
PT Cancellation Note  Patient Details Name: Kelly [name] MRN: [id number] DOB: November 19, 1945   Cancelled Treatment:    Reason Eval/Treat Not Completed: PT screened, no needs identified, will sign off, pt returning to [facility] , ? Today. Defer back to SNF.   Claretha Cooper 02/26/2014, 10:40 AM Tresa Endo PT [phone]

## 2014-02-26 NOTE — Discharge Summary (Signed)
Physician Discharge Summary  Kelly [name] ZOX:[id number] DOB: 1945/08/29 DOA: 02/20/2014  PCP: Alvester Chou, NP  Admit date: 02/20/2014 Discharge date: 02/26/2014  Time spent: 45 minutes  Recommendations for Outpatient Follow-up:  -Will be discharged to [facility] today. -Dr. Migdalia Dk has planned for OP OR time every week to continue debridement and care of her left leg wound.   Discharge Diagnoses:  Principal Problem:   Sepsis associated hypotension Active Problems:   HYPERTENSION   Diabetes mellitus type 2 with neurological manifestations   Obesity hypoventilation syndrome   Chronic diastolic heart failure   Severe sepsis   Pulmonary embolism   UTI (urinary tract infection) due to urinary indwelling Foley catheter   Sinus tachycardia   Chronic kidney disease, stage 3   Leukocytosis   Anemia of chronic renal failure, stage 3 (moderate)   Diabetic neuropathy   Gout   Acute blood loss anemia   Open wound of knee, leg (except thigh), and ankle, complicated   Discharge Condition: Stable and improved  Filed Weights   02/21/14 0445 02/22/14 0500 02/23/14 1000  Weight: 147.873 kg (326 lb) 147.873 kg (326 lb) 150.141 kg (331 lb)    History of present illness:  The patient is a 68 y.o. year-old female with history of chronic diastolic heart failure, hypertension, hyperlipidemia, diabetes mellitus type 2, structures sleep apnea, GERD, depression, and chronic lymphedema, indwelling Foley catheter who presents with fevers and fatigue. The patient was admitted to [facility] from 6/15-6/25 for routine debridement of a chronic infected area on her left inner thigh by plastic surgery. She underwent routine debridement and panniculectomy, however post operative hospital course was complicated by catheter associated urinary tract infection, culture grew mixed flora and right upper lobe acute pulmonary embolism. She was started on warfarin with Lovenox bridge and ciprofloxacin.  She states she was having some low-grade fevers at the time she was discharged and was feeling "okay". She was discharged to skilled nursing facility, and states she normally ambulates with a rolling walker. Upon arrival at the skilled nursing facility, she develops higher grade fevers despite her antibiotics and she became very fatigued. She refers into to the emergency department today with a temperature of 103.8 Fahrenheit and severe fatigue. She states that she has not had increasing left lower extremity pain and her wound VAC is in working well. She denies any respiratory symptoms such as sinus congestion, increased cough or shortness of breath. She has had some increased suprapubic discomfort consistent with urinary tract infection. Her urine has been increasingly cloudy.  In the emergency department, she was found to have a temperature of 103.8 Fahrenheit which decreased to 100.6 Fahrenheit with IV fluids and Tylenol. Her blood pressure went as low as 80 systolic but has been relatively stable around the low 811 systolic. She was initially tachycardic to the 120s and her heart rate is down to the 80s with IV fluids. Labs were notable for WBC 13.3 with neutrophil predominance, hemoglobin at baseline near 9 (anemia of chronic disease and renal disease), creatinine 1.42 (baseline CKD satge 3). Lactic acid 1.3. UA pending but urine very cloudy. CXR negative. BCx pending. Given vanc and cefepime in ER. Hospitalist admission was requested.   Hospital Course:   Sepsis associated hypotension  - Resolved with IVF's and antibiotics as well as cessation of bleeding from lower extremity after discontinuation of anticoagulants.   Severe sepsis  - Source most likely urinary  - Urine cx with E Coli. Will change  abx to Bactrim. Has 5 days remaining on DC. -Sepsis parameters resolved.   Diabetes mellitus type 2 with neurological manifestations  - CBGs well controlled.   Chronic diastolic heart failure  -  given hypotension will hold medications which may decrease her blood pressures  -Appears compensated at present.   Pulmonary embolism  - Pt has required multiple blood transfusions secondary to persistent bleeding from left lower extremity. As a result anticoagulation has been held. - Case was discussed with oncology/hematology and recommendations are for [place] filter. Case also discussed with surgeon states at this juncture anticoagulation is not recommended from her standpoint.  -Had IVC filter placed. No plans for continued anticoagulation at this point.   Acute blood loss anemia  - Requiring several transfusion of PRBCs. - Pt has h/o CKD related anemia at baseline per EMR  - Continue to monitor with serial cbc's  - Consulted Dr. Theodoro Kos associate of Dr. Luetta Nutting (surgeon who performed recent LLE debridement). Would like to thank Dr. Migdalia Dk for her assistance with this case. Plan is to take patient to the OR  02/24/2014.  -Discussed with Dr. Migdalia Dk 7/2. Plan to take patient to OR once a week but ok to DC from her perspective.  -Has a wound vac in place, but will not need changes other than once weekly while in the OR.  UTI (urinary tract infection) due to urinary indwelling Foley catheter  -See above for details.   Morbid obesity  - Registered dietitian on board      Procedures:  As above   Consultations:  Plastic Surgery  Discharge Instructions  Discharge Instructions   Diet - low sodium heart healthy    Complete by:  As directed      Discontinue IV    Complete by:  As directed      Increase activity slowly    Complete by:  As directed             Medication List    STOP taking these medications       ciprofloxacin 500 MG tablet  Commonly known as:  CIPRO     ENOXAPARIN SODIUM [place]     losartan 25 MG tablet  Commonly known as:  COZAAR     oxyCODONE-acetaminophen 7.5-325 MG per tablet  Commonly known as:  PERCOCET     warfarin 5 MG tablet    Commonly known as:  COUMADIN      TAKE these medications       allopurinol 100 MG tablet  Commonly known as:  ZYLOPRIM  Take 100 mg by mouth daily.     aspirin 325 MG EC tablet  Take 325 mg by mouth daily.     atorvastatin 40 MG tablet  Commonly known as:  LIPITOR  Take 40 mg by mouth daily.     chlorthalidone 25 MG tablet  Commonly known as:  HYGROTON  Take 12.5 mg by mouth daily.     esomeprazole 40 MG capsule  Commonly known as:  NEXIUM  Take 40 mg by mouth daily before breakfast.     gabapentin 300 MG capsule  Commonly known as:  NEURONTIN  Take 300 mg by mouth 3 (three) times daily.     isosorbide mononitrate 10 MG tablet  Commonly known as:  ISMO,MONOKET  Take 10 mg by mouth 2 (two) times daily.     magnesium oxide 400 MG tablet  Commonly known as:  MAG-OX  Take 400 mg by mouth daily.  metoprolol tartrate 25 MG tablet  Commonly known as:  LOPRESSOR  Take 37.5 mg by mouth 2 (two) times daily.     oxyCODONE 5 MG immediate release tablet  Commonly known as:  Oxy IR/ROXICODONE  Take 1 tablet (5 mg total) by mouth every 4 (four) hours as needed for severe pain (for pain).     polyethylene glycol packet  Commonly known as:  MIRALAX / GLYCOLAX  Take 17 g by mouth daily.     promethazine 25 MG tablet  Commonly known as:  PHENERGAN  Take 25 mg by mouth 2 (two) times daily as needed for nausea or vomiting.     sulfamethoxazole-trimethoprim 800-160 MG per tablet  Commonly known as:  BACTRIM DS  Take 1 tablet by mouth every 12 (twelve) hours. For 5 days     zolpidem 6.25 MG CR tablet  Commonly known as:  AMBIEN CR  Take 6.25 mg by mouth at bedtime as needed for sleep.       Allergies  Allergen Reactions  . Penicillins Hives      The results of significant diagnostics from this hospitalization (including imaging, microbiology, ancillary and laboratory) are listed below for reference.    Significant Diagnostic Studies: Ir Ivc Filter Plmt / S&i /img  Guid/mod Sed  02/23/2014   CLINICAL DATA:  History of pulmonary embolism. Anticoagulation cannot be continued due to bleeding from the left lower extremity. Request is been made to place an IVC filter.  EXAM: 1. ULTRASOUND GUIDANCE FOR VASCULAR ACCESS OF THE RIGHT INTERNAL JUGULAR VEIN. 2. IVC VENOGRAM. 3. PERCUTANEOUS IVC FILTER PLACEMENT.  ANESTHESIA/SEDATION: 2.0 mg IV Versed; 100 mcg IV Fentanyl.  Total Moderate Sedation Time  50minutes.  CONTRAST:  CO2  FLUOROSCOPY TIME:  48 seconds.  PROCEDURE: The procedure, risks, benefits, and alternatives were explained to the patient. Questions regarding the procedure were encouraged and answered. The patient understands and consents to the procedure.  The right neck was prepped with Betadine in a sterile fashion, and a sterile drape was applied covering the operative field. A sterile gown and sterile gloves were used for the procedure. Local anesthesia was provided with 1% Lidocaine.  Ultrasound was used to confirm patency of the right internal jugular vein. Under direct ultrasound guidance, a 21 gauge needle was advanced into the right internal jugular vein with ultrasound image documentation performed. After securing access with a micropuncture dilator, a guidewire was advanced into the inferior vena cava. A deployment sheath was advanced over the guidewire. This was utilized to perform IVC venography.  The deployment sheath was further positioned in an appropriate location for filter deployment. A Bard Denali IVC filter was then advanced in the sheath. This was then fully deployed in the infrarenal IVC. Final filter position was confirmed with a fluoroscopic spot image. Contrast injection was also performed through the sheath under fluoroscopy to confirm patency of the IVC at the level of the filter. After the procedure the sheath was removed and hemostasis obtained with manual compression.  COMPLICATIONS: None.  FINDINGS: IVC venography demonstrates a normal caliber  IVC with no evidence of thrombus. Renal veins are identified bilaterally. The IVC filter was successfully positioned below the level of the renal veins and is appropriately oriented. This IVC filter has both permanent and retrievable indications.  IMPRESSION: Placement of percutaneous IVC filter in infrarenal IVC. IVC venogram shows no evidence of IVC thrombus and normal caliber of the inferior vena cava. This filter does have both permanent and retrievable indications.  Electronically Signed   By: Aletta Edouard M.D.   On: 02/23/2014 17:19   Dg Chest Port 1 View  02/20/2014   CLINICAL DATA:  Fever.  Weakness.  EXAM: PORTABLE CHEST - 1 VIEW  COMPARISON:  11/17/2012  FINDINGS: No cardiomegaly for technique. Unchanged prominence of the pulmonary vasculature. No edema, consolidation, effusion, or pneumothorax. No acute osseous findings. Degenerative thoracic endplate spurring.  IMPRESSION: No definitive pneumonia.   Electronically Signed   By: Jorje Guild M.D.   On: 02/20/2014 23:51    Microbiology: Recent Results (from the past 240 hour(s))  CULTURE, BLOOD (ROUTINE X 2)     Status: None   Collection Time    02/20/14 11:06 PM      Result Value Ref Range Status   Specimen Description BLOOD LEFT HAND   Final   Special Requests BOTTLES DRAWN AEROBIC AND ANAEROBIC 3CC   Final   Culture  Setup Time     Final   Value: 02/21/2014 16:18     Performed at [facility]   Culture     Final   Value:        BLOOD CULTURE RECEIVED NO GROWTH TO DATE CULTURE WILL BE HELD FOR 5 DAYS BEFORE ISSUING A FINAL NEGATIVE REPORT     Performed at [facility]   Report Status PENDING   Incomplete  CULTURE, BLOOD (ROUTINE X 2)     Status: None   Collection Time    02/20/14 11:35 PM      Result Value Ref Range Status   Specimen Description BLOOD LEFT ANTECUBITAL   Final   Special Requests BOTTLES DRAWN AEROBIC AND ANAEROBIC 5CC   Final   Culture  Setup Time     Final   Value: 02/21/2014 16:18      Performed at [facility]   Culture     Final   Value:        BLOOD CULTURE RECEIVED NO GROWTH TO DATE CULTURE WILL BE HELD FOR 5 DAYS BEFORE ISSUING A FINAL NEGATIVE REPORT     Performed at [facility]   Report Status PENDING   Incomplete  URINE CULTURE     Status: None   Collection Time    02/21/14  3:21 AM      Result Value Ref Range Status   Specimen Description URINE, RANDOM   Final   Special Requests NONE   Final   Culture  Setup Time     Final   Value: 02/21/2014 16:29     Performed at [facility]     Final   Value: >=100,000 COLONIES/ML     Performed at [facility]   Culture     Final   Value: ESCHERICHIA COLI     Performed at [facility]   Report Status 02/24/2014 FINAL   Final   Organism ID, Bacteria ESCHERICHIA COLI   Final  MRSA PCR SCREENING     Status: None   Collection Time    02/21/14  5:37 AM      Result Value Ref Range Status   MRSA by PCR NEGATIVE  NEGATIVE Final   Comment:            The GeneXpert MRSA Assay (FDA     approved for NASAL specimens     only), is one component of a     comprehensive MRSA colonization     surveillance program. It is not  intended to diagnose MRSA     infection nor to guide or     monitor treatment for     MRSA infections.  SURGICAL PCR SCREEN     Status: None   Collection Time    02/24/14  8:40 AM      Result Value Ref Range Status   MRSA, PCR NEGATIVE  NEGATIVE Final   Staphylococcus aureus NEGATIVE  NEGATIVE Final   Comment:            The Xpert SA Assay (FDA     approved for NASAL specimens     in patients over 58 years of age),     is one component of     a comprehensive surveillance     program.  Test performance has     been validated by [employer] for patients greater     than or equal to 46 year old.     It is not intended     to diagnose infection nor to     guide or monitor treatment.     Performed at [place]: Basic Metabolic Panel:  Recent Labs Lab 02/22/14 0200 02/23/14 0330 02/24/14 0335 02/25/14 0345 02/26/14 0520  NA 134* 135* 137 136* 136*  K 5.3 5.4* 5.2 5.8* 5.7*  CL 103 106 109 108 107  CO2 18* 20 18* 20 19  GLUCOSE 181* 194* 180* 146* 106*  BUN 31* 35* 31* 31* 31*  CREATININE 1.60* 1.56* 1.31* 1.42* 1.38*  CALCIUM 7.6* 7.3* 7.8* 7.7* 7.9*   Liver Function Tests:  Recent Labs Lab 02/20/14 2335  AST 23  ALT 25  ALKPHOS 153*  BILITOT 0.2*  PROT 7.0  ALBUMIN 1.9*   No results found for this basename: LIPASE, AMYLASE,  in the last 168 hours No results found for this basename: AMMONIA,  in the last 168 hours CBC:  Recent Labs Lab 02/20/14 2335  02/22/14 1030 02/23/14 0330 02/24/14 0335 02/25/14 0345 02/26/14 0520  WBC 13.3*  < > 10.7* 11.6* 10.1 10.4 9.0  NEUTROABS 12.2*  --   --   --   --   --   --   HGB 9.0*  < > 6.4* 8.9* 8.8* 7.9* 8.1*  HCT 28.0*  < > 19.5* 26.6* 26.2* 24.4* 24.6*  MCV 89.7  < > 85.9 86.6 88.2 90.4 90.4  PLT 370  < > PLATELET CLUMPS NOTED ON SMEAR, COUNT APPEARS ADEQUATE 224 266 259 280  < > = values in this interval not displayed. Cardiac Enzymes: No results found for this basename: CKTOTAL, CKMB, CKMBINDEX, TROPONINI,  in the last 168 hours BNP: BNP (last 3 results) No results found for this basename: PROBNP,  in the last 8760 hours CBG:  Recent Labs Lab 02/24/14 2119 02/25/14 0829 02/25/14 1313 02/25/14 1557 02/25/14 2129  GLUCAP 163* 116* 151* 155* 133*       Signed:  HERNANDEZ ACOSTA,ESTELA  [facility] Pager: [phone] 02/26/2014, 10:19 AM

## 2014-02-26 NOTE — Progress Notes (Signed)
Gave report to Sharyn Lull, Therapist, sports at [facility]. Left number if had additional questions.

## 2014-02-26 NOTE — Progress Notes (Signed)
OT Cancellation Note  Patient Details Name: Kelly [name] MRN: [id number] DOB: 05-28-1946   Cancelled Treatment:    Reason Eval/Treat Not Completed: Other (comment).  Pt is from [facility] and plans to return there.  Will defer OT eval to next venue.  [name] 02/26/2014, 9:43 AM Lesle Chris, OTR/L [phone] 02/26/2014

## 2014-02-26 NOTE — Progress Notes (Signed)
Clinical Social Work  CSW faxed DC summary to [facility] who is agreeable to accept patient today. CSW informed patient of DC plans and patient reports she will call and update family. Patient reports she is happy to DC and hopes to start feeling better soon. Patient reports family is supportive and will visit and call often. RN to call report. SNF aware of wound vac and are able to accept today. CSW prepared DC packet with FL2 and hard scripts included. CSW coordinated transportation via [facility] per patient request. [facility] request #: [phone].  CSW is signing off but available if needed.  [place], [place] [phone]

## 2014-02-27 LAB — CULTURE, BLOOD (ROUTINE X 2)
CULTURE: NO GROWTH
Culture: NO GROWTH

## 2014-02-28 ENCOUNTER — Other Ambulatory Visit: Payer: Self-pay | Admitting: Plastic Surgery

## 2014-02-28 DIAGNOSIS — S81802D Unspecified open wound, left lower leg, subsequent encounter: Principal | ICD-10-CM

## 2014-02-28 DIAGNOSIS — S81002D Unspecified open wound, left knee, subsequent encounter: Secondary | ICD-10-CM

## 2014-02-28 DIAGNOSIS — S91002D Unspecified open wound, left ankle, subsequent encounter: Principal | ICD-10-CM

## 2014-03-01 ENCOUNTER — Non-Acute Institutional Stay (SKILLED_NURSING_FACILITY): Payer: Medicare Other | Admitting: Internal Medicine

## 2014-03-01 ENCOUNTER — Ambulatory Visit (HOSPITAL_COMMUNITY): Admission: RE | Admit: 2014-03-01 | Payer: Medicare Other | Source: Ambulatory Visit | Admitting: Plastic Surgery

## 2014-03-01 ENCOUNTER — Encounter (HOSPITAL_COMMUNITY): Admission: RE | Payer: Self-pay | Source: Ambulatory Visit

## 2014-03-01 DIAGNOSIS — E1149 Type 2 diabetes mellitus with other diabetic neurological complication: Secondary | ICD-10-CM

## 2014-03-01 DIAGNOSIS — N390 Urinary tract infection, site not specified: Secondary | ICD-10-CM

## 2014-03-01 DIAGNOSIS — T83511A Infection and inflammatory reaction due to indwelling urethral catheter, initial encounter: Secondary | ICD-10-CM

## 2014-03-01 DIAGNOSIS — D62 Acute posthemorrhagic anemia: Secondary | ICD-10-CM

## 2014-03-01 DIAGNOSIS — I2699 Other pulmonary embolism without acute cor pulmonale: Secondary | ICD-10-CM

## 2014-03-01 SURGERY — IRRIGATION AND DEBRIDEMENT EXTREMITY
Anesthesia: General | Laterality: Left

## 2014-03-01 NOTE — Progress Notes (Signed)
Pt too ill for DSC-non compliant cpap-just d/c with bleeding and sepsis'-anesthesia states needs to be done main or-office called

## 2014-03-02 ENCOUNTER — Other Ambulatory Visit: Payer: Self-pay

## 2014-03-02 ENCOUNTER — Other Ambulatory Visit: Payer: Self-pay | Admitting: *Deleted

## 2014-03-02 ENCOUNTER — Encounter (HOSPITAL_COMMUNITY): Payer: Self-pay | Admitting: *Deleted

## 2014-03-02 MED ORDER — ZOLPIDEM TARTRATE ER 6.25 MG PO TBCR: 6.2500 mg | EXTENDED_RELEASE_TABLET | Freq: Every evening | ORAL | Status: DC | PRN

## 2014-03-02 MED ORDER — OXYCODONE HCL 5 MG PO TABS: 5.0000 mg | ORAL_TABLET | ORAL | Status: DC | PRN

## 2014-03-02 MED ORDER — CIPROFLOXACIN IN D5W 400 MG/200ML IV SOLN
400.0000 mg | INTRAVENOUS | Status: AC
  Administered 2014-03-03: 400 mg via INTRAVENOUS
  Filled 2014-03-02: qty 200

## 2014-03-02 NOTE — Telephone Encounter (Signed)
Rx faxed to [facility] @ [phone], phone number [phone]  

## 2014-03-02 NOTE — Progress Notes (Signed)
HISTORY & PHYSICAL  DATE: 03/01/2014   FACILITY: [facility]  LEVEL OF CARE: SNF (31)  ALLERGIES:  Allergies  Allergen Reactions  . Penicillins Hives    CHIEF COMPLAINT:  Manage UTI,  PE and acute blood loss anemia  HISTORY OF PRESENT ILLNESS: [name] was hospitalized for routine debridement and panniculectomy of her left inner thigh. after the hospitalization she admitted to this facility for short-term rehabilitation and wound care. While in the hospital she developed following problems as well.  UTI: The UTI remains stable.  The patient denies ongoing suprapubic pain, flank pain, dysuria, urinary frequency, urinary hesitancy or hematuria.  No complications reported from the current antibiotic being used.  PULMONARY EMBOLISM: The pulmonary embolism remains stable. Patient is off anticoagulation secondary to persistent bleeding from the left lower extremity.  Patient denies chest pain or shortness of breath. She is status post IVC filter placement.  ANEMIA: The anemia has been stable. The patient denies fatigue, melena or hematochezia. No complications from the medications currently being used. Patient suffered acute is anemia secondary to persistent left lower extremity bleeding. She is status post multiple blood transfusions.  PAST MEDICAL HISTORY :  Past Medical History  Diagnosis Date  . Hypertension   . Hyperlipidemia   . CHF (congestive heart failure)   . GERD (gastroesophageal reflux disease)   . Depression   . Sleep apnea     doesn't use cpap machine or 02 at night  . Headache(784.0)     Migraines  . Arthritis     knees  . Anemia     Had blood transfusion x 2 in yhe 90s  . Lymphedema of leg   . Pulmonary embolism 02/18/14    diagnosed at [facility] with CT angio chest  . Chronic indwelling Foley catheter   . Diabetes mellitus     not on medications    PAST SURGICAL HISTORY: Past Surgical History  Procedure  Laterality Date  . Rotator cuff repair      right  . Knee arthroscopy      right  . Temple biopsy    . Colonoscopy with propofol  09/23/2012    Procedure: COLONOSCOPY WITH PROPOFOL;  Surgeon: Jerene Bears, MD;  Location: [facility];  Service: Gastroenterology;  Laterality: N/A;  . Paniculectomy with wound debridement Left 02/08/2014    left medial thigh  . Irrigation and debridement abscess Left 02/24/2014    Procedure: MINOR INCISION AND DRAINAGE OF ABSCESS;  Surgeon: Theodoro Kos, DO;  Location: [facility] ORS;  Service: Plastics;  Laterality: Left;    SOCIAL HISTORY:  reports that she quit smoking about 32 years ago. Her smoking use included Cigarettes. She smoked 0.00 packs per day. She has never used smokeless tobacco. She reports that she does not drink alcohol or use illicit drugs.  FAMILY HISTORY:  Family History  Problem Relation Age of Onset  . Emphysema Father   . Asthma Brother   . Heart disease Mother   . Stomach cancer Brother   . Heart disease Maternal Grandmother   . Diabetes Mother   . Diabetes Sister     CURRENT MEDICATIONS: Reviewed per MAR/see medication list  REVIEW OF SYSTEMS: Cardiac-complains of chronic lower extremity swelling, See HPI otherwise 14 point ROS is negative.  PHYSICAL EXAMINATION  VS:  See VS section  GENERAL: no acute distress, morbidly obese body habitus EYES: conjunctivae normal, sclerae normal, normal eye lids MOUTH/THROAT: lips  without lesions,no lesions in the mouth,tongue is without lesions,uvula elevates in midline NECK: supple, trachea midline, no neck masses, no thyroid tenderness, no thyromegaly LYMPHATICS: no LAN in the neck, no supraclavicular LAN RESPIRATORY: breathing is even & unlabored, BS CTAB CARDIAC: RRR, no murmur,no extra heart sounds, +2 bilateral lower extremity edema GI:  ABDOMEN: abdomen soft, normal BS, no masses, no tenderness  LIVER/SPLEEN: no hepatomegaly, no splenomegaly MUSCULOSKELETAL: HEAD: normal to  inspection  EXTREMITIES: LEFT UPPER EXTREMITY: full range of motion, normal strength & tone RIGHT UPPER EXTREMITY:  Minimal range of motion, decreased strength & tone LEFT LOWER EXTREMITY:   range of motion not tested due to wound VAC, normal strength & tone RIGHT LOWER EXTREMITY:  full range of motion, normal strength & tone PSYCHIATRIC: the patient is alert & oriented to person, affect & behavior appropriate  LABS/RADIOLOGY:  Labs reviewed: Basic Metabolic Panel:  Recent Labs  05/02/13 0445  02/24/14 0335 02/25/14 0345 02/26/14 0520  NA 139  < > 137 136* 136*  K 3.7  < > 5.2 5.8* 5.7*  CL 104  < > 109 108 107  CO2 29  < > 18* 20 19  GLUCOSE 121*  < > 180* 146* 106*  BUN 75*  < > 31* 31* 31*  CREATININE 2.65*  < > 1.31* 1.42* 1.38*  CALCIUM 8.4  < > 7.8* 7.7* 7.9*  MG 1.7  --   --   --   --   PHOS 4.4  --   --   --   --   < > = values in this interval not displayed. Liver Function Tests:  Recent Labs  05/01/13 1900 02/20/14 2335  AST 57* 23  ALT 60* 25  ALKPHOS 158* 153*  BILITOT 0.4 0.2*  PROT 7.1 7.0  ALBUMIN 2.6* 1.9*    Recent Labs  05/01/13 1900  LIPASE 13   CBC:  Recent Labs  05/01/13 1900  02/20/14 2335  02/24/14 0335 02/25/14 0345 02/26/14 0520  WBC 5.4  < > 13.3*  < > 10.1 10.4 9.0  NEUTROABS 3.6  --  12.2*  --   --   --   --   HGB 10.1*  < > 9.0*  < > 8.8* 7.9* 8.1*  HCT 31.1*  < > 28.0*  < > 26.2* 24.4* 24.6*  MCV 85.9  < > 89.7  < > 88.2 90.4 90.4  PLT 181  < > 370  < > 266 259 280  < > = values in this interval not displayed.  CBG:  Recent Labs  02/25/14 1557 02/25/14 2129 02/26/14 1145  GLUCAP 155* 133* 145*    PORTABLE CHEST - 1 VIEW   COMPARISON:  11/17/2012   FINDINGS: No cardiomegaly for technique. Unchanged prominence of the pulmonary vasculature. No edema, consolidation, effusion, or pneumothorax. No acute osseous findings. Degenerative thoracic endplate spurring.   IMPRESSION: No definitive  pneumonia.   ASSESSMENT/PLAN:  UTI-continue Bactrim as prescribed. PE-no anticoagulation. Status post IVC filter placement. Acute blood loss anemia-status post multiple transfusions. Check hemoglobin level. Diabetes mellitus with neurologic complications-CBGs controlled in the hospital. CHF-compensated. Chronic kidney disease stage III-recheck renal functions. Diabetic neuropathy-continue Neurontin left lower extremity the wound-continue wound VAC. Surgeon plans for continued debridement. Check CBC and BMP  I have reviewed patient's medical records received at admission/from hospitalization.  CPT CODE: 63016  Leanny Moeckel Y Latesha Chesney, [place] [phone]

## 2014-03-03 ENCOUNTER — Ambulatory Visit (HOSPITAL_COMMUNITY): Payer: Medicare Other

## 2014-03-03 ENCOUNTER — Encounter (HOSPITAL_COMMUNITY): Payer: Self-pay | Admitting: Plastic Surgery

## 2014-03-03 ENCOUNTER — Encounter (HOSPITAL_BASED_OUTPATIENT_CLINIC_OR_DEPARTMENT_OTHER): Admission: RE | Payer: Self-pay | Source: Ambulatory Visit

## 2014-03-03 ENCOUNTER — Encounter (HOSPITAL_COMMUNITY): Payer: Medicare Other | Admitting: Anesthesiology

## 2014-03-03 ENCOUNTER — Ambulatory Visit (HOSPITAL_BASED_OUTPATIENT_CLINIC_OR_DEPARTMENT_OTHER): Admission: RE | Admit: 2014-03-03 | Payer: Medicare Other | Source: Ambulatory Visit | Admitting: Plastic Surgery

## 2014-03-03 ENCOUNTER — Encounter (HOSPITAL_COMMUNITY)
Admission: RE | Disposition: A | Discharge status: Rehabilitation Facility | Payer: Self-pay | Source: Ambulatory Visit | Attending: Internal Medicine

## 2014-03-03 ENCOUNTER — Ambulatory Visit (HOSPITAL_COMMUNITY): Payer: Medicare Other | Admitting: Anesthesiology

## 2014-03-03 ENCOUNTER — Inpatient Hospital Stay (HOSPITAL_COMMUNITY)
Admission: RE | Admit: 2014-03-03 | Discharge: 2014-03-05 | DRG: 593 | Disposition: A | Discharge status: Rehabilitation Facility | Payer: Medicare Other | Source: Ambulatory Visit | Attending: Internal Medicine | Admitting: Internal Medicine

## 2014-03-03 DIAGNOSIS — S81802D Unspecified open wound, left lower leg, subsequent encounter: Secondary | ICD-10-CM

## 2014-03-03 DIAGNOSIS — K219 Gastro-esophageal reflux disease without esophagitis: Secondary | ICD-10-CM | POA: Diagnosis present

## 2014-03-03 DIAGNOSIS — E662 Morbid (severe) obesity with alveolar hypoventilation: Secondary | ICD-10-CM

## 2014-03-03 DIAGNOSIS — I10 Essential (primary) hypertension: Secondary | ICD-10-CM

## 2014-03-03 DIAGNOSIS — Z8249 Family history of ischemic heart disease and other diseases of the circulatory system: Secondary | ICD-10-CM

## 2014-03-03 DIAGNOSIS — M1710 Unilateral primary osteoarthritis, unspecified knee: Secondary | ICD-10-CM | POA: Diagnosis present

## 2014-03-03 DIAGNOSIS — D72829 Elevated white blood cell count, unspecified: Secondary | ICD-10-CM

## 2014-03-03 DIAGNOSIS — Z825 Family history of asthma and other chronic lower respiratory diseases: Secondary | ICD-10-CM

## 2014-03-03 DIAGNOSIS — E875 Hyperkalemia: Secondary | ICD-10-CM

## 2014-03-03 DIAGNOSIS — F3289 Other specified depressive episodes: Secondary | ICD-10-CM | POA: Diagnosis present

## 2014-03-03 DIAGNOSIS — N189 Chronic kidney disease, unspecified: Secondary | ICD-10-CM

## 2014-03-03 DIAGNOSIS — N183 Chronic kidney disease, stage 3 unspecified: Secondary | ICD-10-CM

## 2014-03-03 DIAGNOSIS — Z86711 Personal history of pulmonary embolism: Secondary | ICD-10-CM

## 2014-03-03 DIAGNOSIS — E785 Hyperlipidemia, unspecified: Secondary | ICD-10-CM | POA: Diagnosis present

## 2014-03-03 DIAGNOSIS — Z5189 Encounter for other specified aftercare: Secondary | ICD-10-CM

## 2014-03-03 DIAGNOSIS — D631 Anemia in chronic kidney disease: Secondary | ICD-10-CM

## 2014-03-03 DIAGNOSIS — I509 Heart failure, unspecified: Secondary | ICD-10-CM | POA: Diagnosis present

## 2014-03-03 DIAGNOSIS — G4733 Obstructive sleep apnea (adult) (pediatric): Secondary | ICD-10-CM | POA: Diagnosis present

## 2014-03-03 DIAGNOSIS — N390 Urinary tract infection, site not specified: Secondary | ICD-10-CM

## 2014-03-03 DIAGNOSIS — R6520 Severe sepsis without septic shock: Secondary | ICD-10-CM

## 2014-03-03 DIAGNOSIS — D62 Acute posthemorrhagic anemia: Secondary | ICD-10-CM

## 2014-03-03 DIAGNOSIS — A419 Sepsis, unspecified organism: Secondary | ICD-10-CM

## 2014-03-03 DIAGNOSIS — S91009D Unspecified open wound, unspecified ankle, subsequent encounter: Secondary | ICD-10-CM

## 2014-03-03 DIAGNOSIS — I129 Hypertensive chronic kidney disease with stage 1 through stage 4 chronic kidney disease, or unspecified chronic kidney disease: Secondary | ICD-10-CM | POA: Diagnosis present

## 2014-03-03 DIAGNOSIS — R609 Edema, unspecified: Secondary | ICD-10-CM

## 2014-03-03 DIAGNOSIS — S81002D Unspecified open wound, left knee, subsequent encounter: Secondary | ICD-10-CM

## 2014-03-03 DIAGNOSIS — Z23 Encounter for immunization: Secondary | ICD-10-CM

## 2014-03-03 DIAGNOSIS — Z880 Allergy status to penicillin: Secondary | ICD-10-CM

## 2014-03-03 DIAGNOSIS — S91002D Unspecified open wound, left ankle, subsequent encounter: Secondary | ICD-10-CM

## 2014-03-03 DIAGNOSIS — E0843 Diabetes mellitus due to underlying condition with diabetic autonomic (poly)neuropathy: Secondary | ICD-10-CM

## 2014-03-03 DIAGNOSIS — I959 Hypotension, unspecified: Secondary | ICD-10-CM

## 2014-03-03 DIAGNOSIS — S81809A Unspecified open wound, unspecified lower leg, initial encounter: Secondary | ICD-10-CM

## 2014-03-03 DIAGNOSIS — F411 Generalized anxiety disorder: Secondary | ICD-10-CM | POA: Diagnosis present

## 2014-03-03 DIAGNOSIS — Z87891 Personal history of nicotine dependence: Secondary | ICD-10-CM

## 2014-03-03 DIAGNOSIS — F329 Major depressive disorder, single episode, unspecified: Secondary | ICD-10-CM | POA: Diagnosis present

## 2014-03-03 DIAGNOSIS — S81009A Unspecified open wound, unspecified knee, initial encounter: Secondary | ICD-10-CM

## 2014-03-03 DIAGNOSIS — R000 Tachycardia, unspecified: Secondary | ICD-10-CM

## 2014-03-03 DIAGNOSIS — I5032 Chronic diastolic (congestive) heart failure: Secondary | ICD-10-CM

## 2014-03-03 DIAGNOSIS — S81009D Unspecified open wound, unspecified knee, subsequent encounter: Secondary | ICD-10-CM

## 2014-03-03 DIAGNOSIS — S91009A Unspecified open wound, unspecified ankle, initial encounter: Secondary | ICD-10-CM

## 2014-03-03 DIAGNOSIS — Z6841 Body Mass Index (BMI) 40.0 and over, adult: Secondary | ICD-10-CM

## 2014-03-03 DIAGNOSIS — Z7982 Long term (current) use of aspirin: Secondary | ICD-10-CM

## 2014-03-03 DIAGNOSIS — S81809D Unspecified open wound, unspecified lower leg, subsequent encounter: Secondary | ICD-10-CM

## 2014-03-03 DIAGNOSIS — Z800 Family history of malignant neoplasm of digestive organs: Secondary | ICD-10-CM

## 2014-03-03 DIAGNOSIS — IMO0002 Reserved for concepts with insufficient information to code with codable children: Secondary | ICD-10-CM

## 2014-03-03 DIAGNOSIS — L97909 Non-pressure chronic ulcer of unspecified part of unspecified lower leg with unspecified severity: Principal | ICD-10-CM

## 2014-03-03 DIAGNOSIS — T83511A Infection and inflammatory reaction due to indwelling urethral catheter, initial encounter: Secondary | ICD-10-CM

## 2014-03-03 DIAGNOSIS — Z7401 Bed confinement status: Secondary | ICD-10-CM

## 2014-03-03 DIAGNOSIS — N179 Acute kidney failure, unspecified: Secondary | ICD-10-CM

## 2014-03-03 DIAGNOSIS — I2699 Other pulmonary embolism without acute cor pulmonale: Secondary | ICD-10-CM

## 2014-03-03 DIAGNOSIS — E1149 Type 2 diabetes mellitus with other diabetic neurological complication: Secondary | ICD-10-CM

## 2014-03-03 DIAGNOSIS — I872 Venous insufficiency (chronic) (peripheral): Secondary | ICD-10-CM

## 2014-03-03 HISTORY — PX: I & D EXTREMITY: SHX5045

## 2014-03-03 LAB — BASIC METABOLIC PANEL
ANION GAP: 11 (ref 5–15)
Anion gap: 13 (ref 5–15)
Anion gap: 12 (ref 5–15)
BUN: 23 mg/dL (ref 6–23)
BUN: 22 mg/dL (ref 6–23)
BUN: 22 mg/dL (ref 6–23)
CALCIUM: 8.3 mg/dL — AB (ref 8.4–10.5)
CALCIUM: 8.3 mg/dL — AB (ref 8.4–10.5)
CALCIUM: 8.1 mg/dL — AB (ref 8.4–10.5)
CHLORIDE: 105 meq/L (ref 96–112)
CO2: 20 mEq/L (ref 19–32)
CO2: 18 mEq/L — ABNORMAL LOW (ref 19–32)
CO2: 19 meq/L (ref 19–32)
CREATININE: 1.44 mg/dL — AB (ref 0.50–1.10)
CREATININE: 1.46 mg/dL — AB (ref 0.50–1.10)
Chloride: 101 mEq/L (ref 96–112)
Chloride: 104 mEq/L (ref 96–112)
Creatinine, Ser: 1.41 mg/dL — ABNORMAL HIGH (ref 0.50–1.10)
GFR calc Af Amer: 42 mL/min — ABNORMAL LOW (ref >90)
GFR calc Af Amer: 44 mL/min — ABNORMAL LOW (ref >90)
GFR calc non Af Amer: 37 mL/min — ABNORMAL LOW (ref >90)
GFR calc non Af Amer: 38 mL/min — ABNORMAL LOW (ref >90)
GFR, EST AFRICAN AMERICAN: 43 mL/min — AB (ref >90)
GFR, EST NON AFRICAN AMERICAN: 36 mL/min — AB (ref >90)
GLUCOSE: 111 mg/dL — AB (ref 70–99)
GLUCOSE: 118 mg/dL — AB (ref 70–99)
Glucose, Bld: 86 mg/dL (ref 70–99)
Potassium: 6.5 mEq/L (ref 3.7–5.3)
Potassium: 6.6 mEq/L (ref 3.7–5.3)
Potassium: 6.6 mEq/L (ref 3.7–5.3)
SODIUM: 132 meq/L — AB (ref 137–147)
Sodium: 136 mEq/L — ABNORMAL LOW (ref 137–147)
Sodium: 135 mEq/L — ABNORMAL LOW (ref 137–147)

## 2014-03-03 LAB — CBC
HCT: 27.2 % — ABNORMAL LOW (ref 36.0–46.0)
HEMOGLOBIN: 8.8 g/dL — AB (ref 12.0–15.0)
MCH: 29.2 pg (ref 26.0–34.0)
MCHC: 32.4 g/dL (ref 30.0–36.0)
MCV: 90.4 fL (ref 78.0–100.0)
Platelets: 364 10*3/uL (ref 150–400)
RBC: 3.01 MIL/uL — ABNORMAL LOW (ref 3.87–5.11)
RDW: 14.4 % (ref 11.5–15.5)
WBC: 8.1 10*3/uL (ref 4.0–10.5)

## 2014-03-03 LAB — GLUCOSE, CAPILLARY
Glucose-Capillary: 88 mg/dL (ref 70–99)
Glucose-Capillary: 123 mg/dL — ABNORMAL HIGH (ref 70–99)

## 2014-03-03 LAB — POCT I-STAT 4, (NA,K, GLUC, HGB,HCT)
GLUCOSE: 114 mg/dL — AB (ref 70–99)
Glucose, Bld: 88 mg/dL (ref 70–99)
HCT: 26 % — ABNORMAL LOW (ref 36.0–46.0)
HCT: 25 % — ABNORMAL LOW (ref 36.0–46.0)
HEMOGLOBIN: 8.8 g/dL — AB (ref 12.0–15.0)
Hemoglobin: 8.5 g/dL — ABNORMAL LOW (ref 12.0–15.0)
POTASSIUM: 6.2 meq/L — AB (ref 3.7–5.3)
Potassium: 5.6 mEq/L — ABNORMAL HIGH (ref 3.7–5.3)
Sodium: 136 mEq/L — ABNORMAL LOW (ref 137–147)
Sodium: 137 mEq/L (ref 137–147)

## 2014-03-03 LAB — MRSA PCR SCREENING: MRSA by PCR: NEGATIVE

## 2014-03-03 SURGERY — IRRIGATION AND DEBRIDEMENT EXTREMITY
Anesthesia: General | Site: Leg Upper | Laterality: Left

## 2014-03-03 SURGERY — IRRIGATION AND DEBRIDEMENT EXTREMITY
Anesthesia: General | Laterality: Left

## 2014-03-03 MED ORDER — PROPOFOL INFUSION 10 MG/ML OPTIME
INTRAVENOUS | Status: DC | PRN
  Administered 2014-03-03: 50 ug/kg/min via INTRAVENOUS

## 2014-03-03 MED ORDER — POLYETHYLENE GLYCOL 3350 17 G PO PACK
17.0000 g | PACK | Freq: Every day | ORAL | Status: DC
  Administered 2014-03-04: 17 g via ORAL
  Filled 2014-03-03 (×2): qty 1

## 2014-03-03 MED ORDER — METOPROLOL TARTRATE 25 MG PO TABS
37.5000 mg | ORAL_TABLET | Freq: Two times a day (BID) | ORAL | Status: DC
  Administered 2014-03-04 – 2014-03-05 (×3): 37.5 mg via ORAL
  Filled 2014-03-03 (×4): qty 1

## 2014-03-03 MED ORDER — INSULIN ASPART 100 UNIT/ML IV SOLN
10.0000 [IU] | Freq: Once | INTRAVENOUS | Status: DC
  Filled 2014-03-03: qty 0.1

## 2014-03-03 MED ORDER — SODIUM POLYSTYRENE SULFONATE 15 GM/60ML PO SUSP
30.0000 g | Freq: Once | ORAL | Status: AC
  Administered 2014-03-03: 30 g via ORAL
  Filled 2014-03-03: qty 120

## 2014-03-03 MED ORDER — GABAPENTIN 300 MG PO CAPS
300.0000 mg | ORAL_CAPSULE | Freq: Three times a day (TID) | ORAL | Status: DC
  Administered 2014-03-03 – 2014-03-05 (×5): 300 mg via ORAL
  Filled 2014-03-03 (×7): qty 1

## 2014-03-03 MED ORDER — FUROSEMIDE 10 MG/ML IJ SOLN
40.0000 mg | Freq: Once | INTRAMUSCULAR | Status: AC
  Administered 2014-03-03: 40 mg via INTRAVENOUS
  Filled 2014-03-03: qty 4

## 2014-03-03 MED ORDER — PANTOPRAZOLE SODIUM 40 MG PO TBEC
40.0000 mg | DELAYED_RELEASE_TABLET | Freq: Every day | ORAL | Status: DC
  Administered 2014-03-04 – 2014-03-05 (×2): 40 mg via ORAL
  Filled 2014-03-03 (×2): qty 1

## 2014-03-03 MED ORDER — FENTANYL CITRATE 0.05 MG/ML IJ SOLN
INTRAMUSCULAR | Status: DC | PRN
  Administered 2014-03-03 (×4): 50 ug via INTRAVENOUS

## 2014-03-03 MED ORDER — FENTANYL CITRATE 0.05 MG/ML IJ SOLN
25.0000 ug | INTRAMUSCULAR | Status: DC | PRN
  Administered 2014-03-03 (×2): 50 ug via INTRAVENOUS

## 2014-03-03 MED ORDER — ONDANSETRON HCL 4 MG/2ML IJ SOLN
INTRAMUSCULAR | Status: AC
  Filled 2014-03-03: qty 2

## 2014-03-03 MED ORDER — MIDAZOLAM HCL 5 MG/5ML IJ SOLN
INTRAMUSCULAR | Status: DC | PRN
  Administered 2014-03-03 (×2): 1 mg via INTRAVENOUS

## 2014-03-03 MED ORDER — SODIUM POLYSTYRENE SULFONATE 15 GM/60ML PO SUSP
30.0000 g | Freq: Once | ORAL | Status: AC
  Administered 2014-03-03: 30 g via ORAL
  Filled 2014-03-03 (×2): qty 120

## 2014-03-03 MED ORDER — PROPOFOL 10 MG/ML IV BOLUS
INTRAVENOUS | Status: AC
  Filled 2014-03-03: qty 20

## 2014-03-03 MED ORDER — DEXTROSE 5 % IV SOLN
INTRAVENOUS | Status: DC | PRN
  Administered 2014-03-03: 10:00:00 via INTRAVENOUS

## 2014-03-03 MED ORDER — ISOSORBIDE MONONITRATE 10 MG PO TABS
10.0000 mg | ORAL_TABLET | Freq: Two times a day (BID) | ORAL | Status: DC
  Administered 2014-03-03 – 2014-03-05 (×4): 10 mg via ORAL
  Filled 2014-03-03 (×8): qty 1

## 2014-03-03 MED ORDER — MIDAZOLAM HCL 2 MG/2ML IJ SOLN
INTRAMUSCULAR | Status: AC
  Filled 2014-03-03: qty 2

## 2014-03-03 MED ORDER — ZOLPIDEM TARTRATE 5 MG PO TABS
5.0000 mg | ORAL_TABLET | Freq: Every evening | ORAL | Status: DC | PRN
  Administered 2014-03-05: 5 mg via ORAL
  Filled 2014-03-03: qty 1

## 2014-03-03 MED ORDER — ATORVASTATIN CALCIUM 40 MG PO TABS
40.0000 mg | ORAL_TABLET | Freq: Every day | ORAL | Status: DC
  Administered 2014-03-03 – 2014-03-05 (×3): 40 mg via ORAL
  Filled 2014-03-03 (×4): qty 1

## 2014-03-03 MED ORDER — SODIUM CHLORIDE 0.9 % IV SOLN
INTRAVENOUS | Status: DC | PRN
  Administered 2014-03-03: 11:00:00 via INTRAVENOUS

## 2014-03-03 MED ORDER — LACTATED RINGERS IV SOLN
INTRAVENOUS | Status: DC
  Administered 2014-03-03: 50 mL/h via INTRAVENOUS

## 2014-03-03 MED ORDER — HYDROGEN PEROXIDE 3 % EX SOLN
CUTANEOUS | Status: DC | PRN
  Administered 2014-03-03: 1

## 2014-03-03 MED ORDER — SODIUM CHLORIDE 0.9 % IV SOLN
1.0000 g | Freq: Once | INTRAVENOUS | Status: AC
  Administered 2014-03-03: 1 g via INTRAVENOUS
  Filled 2014-03-03 (×2): qty 10

## 2014-03-03 MED ORDER — HEPARIN SODIUM (PORCINE) 5000 UNIT/ML IJ SOLN
5000.0000 [IU] | Freq: Three times a day (TID) | INTRAMUSCULAR | Status: DC
  Administered 2014-03-03 – 2014-03-05 (×5): 5000 [IU] via SUBCUTANEOUS
  Filled 2014-03-03 (×6): qty 1

## 2014-03-03 MED ORDER — FENTANYL CITRATE 0.05 MG/ML IJ SOLN
INTRAMUSCULAR | Status: AC
  Filled 2014-03-03: qty 5

## 2014-03-03 MED ORDER — FENTANYL CITRATE 0.05 MG/ML IJ SOLN
INTRAMUSCULAR | Status: AC
  Filled 2014-03-03: qty 2

## 2014-03-03 MED ORDER — LACTATED RINGERS IV SOLN
INTRAVENOUS | Status: DC | PRN
  Administered 2014-03-03: 09:00:00 via INTRAVENOUS

## 2014-03-03 MED ORDER — ALLOPURINOL 100 MG PO TABS
100.0000 mg | ORAL_TABLET | Freq: Every day | ORAL | Status: DC
  Administered 2014-03-03 – 2014-03-05 (×3): 100 mg via ORAL
  Filled 2014-03-03 (×4): qty 1

## 2014-03-03 MED ORDER — DEXTROSE 50 % IV SOLN
1.0000 | Freq: Once | INTRAVENOUS | Status: AC
  Administered 2014-03-03: 50 mL via INTRAVENOUS
  Filled 2014-03-03 (×3): qty 50

## 2014-03-03 MED ORDER — OXYCODONE HCL 5 MG PO TABS
5.0000 mg | ORAL_TABLET | ORAL | Status: DC | PRN
  Administered 2014-03-03 – 2014-03-05 (×6): 5 mg via ORAL
  Filled 2014-03-03 (×6): qty 1

## 2014-03-03 MED ORDER — SODIUM CHLORIDE 0.9 % IR SOLN
Status: DC | PRN
  Administered 2014-03-03: 1000 mL

## 2014-03-03 MED ORDER — INSULIN ASPART 100 UNIT/ML IV SOLN
10.0000 [IU] | Freq: Once | INTRAVENOUS | Status: AC
  Administered 2014-03-03: 10 [IU] via INTRAVENOUS

## 2014-03-03 SURGICAL SUPPLY — 46 items
APL SKNCLS STERI-STRIP NONHPOA (GAUZE/BANDAGES/DRESSINGS) ×1
BANDAGE ELASTIC 4 VELCRO ST LF (GAUZE/BANDAGES/DRESSINGS) IMPLANT
BANDAGE GAUZE ELAST BULKY 4 IN (GAUZE/BANDAGES/DRESSINGS) IMPLANT
BENZOIN TINCTURE PRP APPL 2/3 (GAUZE/BANDAGES/DRESSINGS) ×2 IMPLANT
BLADE 10 SAFETY STRL DISP (BLADE) ×1 IMPLANT
BLADE SURG 10 STRL SS (BLADE) ×2 IMPLANT
BLADE SURG ROTATE 9660 (MISCELLANEOUS) IMPLANT
CANISTER SUCTION 2500CC (MISCELLANEOUS) ×3 IMPLANT
CANISTER WOUND CARE 500ML ATS (WOUND CARE) ×2 IMPLANT
CHLORAPREP W/TINT 26ML (MISCELLANEOUS) IMPLANT
COVER SURGICAL LIGHT HANDLE (MISCELLANEOUS) ×3 IMPLANT
DRAPE EXTREMITY T 121X128X90 (DRAPE) IMPLANT
DRAPE INCISE IOBAN 66X45 STRL (DRAPES) ×2 IMPLANT
DRAPE ORTHO SPLIT 77X108 STRL (DRAPES) ×3
DRAPE SURG ORHT 6 SPLT 77X108 (DRAPES) IMPLANT
DRSG ADAPTIC 3X8 NADH LF (GAUZE/BANDAGES/DRESSINGS) ×2 IMPLANT
DRSG PAD ABDOMINAL 8X10 ST (GAUZE/BANDAGES/DRESSINGS) IMPLANT
DRSG VAC ATS LRG SENSATRAC (GAUZE/BANDAGES/DRESSINGS) ×2 IMPLANT
ELECT REM PT RETURN 9FT ADLT (ELECTROSURGICAL) ×3
ELECTRODE REM PT RTRN 9FT ADLT (ELECTROSURGICAL) ×1 IMPLANT
GLOVE BIO SURGEON STRL SZ 6.5 (GLOVE) ×5 IMPLANT
GLOVE BIO SURGEONS STRL SZ 6.5 (GLOVE) ×4
GLOVE BIOGEL PI IND STRL 7.0 (GLOVE) IMPLANT
GLOVE BIOGEL PI INDICATOR 7.0 (GLOVE) ×4
GOWN STRL REUS W/ TWL LRG LVL3 (GOWN DISPOSABLE) ×2 IMPLANT
GOWN STRL REUS W/TWL LRG LVL3 (GOWN DISPOSABLE) ×6
HANDPIECE INTERPULSE COAX TIP (DISPOSABLE)
KIT BASIN OR (CUSTOM PROCEDURE TRAY) ×3 IMPLANT
KIT ROOM TURNOVER OR (KITS) ×3 IMPLANT
MATRIX SURGICAL PSM 10X15CM (Tissue) ×2 IMPLANT
MICROMATRIX 1000MG (Tissue) ×9 IMPLANT
NS IRRIG 1000ML POUR BTL (IV SOLUTION) ×3 IMPLANT
PACK GENERAL/GYN (CUSTOM PROCEDURE TRAY) ×3 IMPLANT
PAD ARMBOARD 7.5X6 YLW CONV (MISCELLANEOUS) ×4 IMPLANT
SET HNDPC FAN SPRY TIP SCT (DISPOSABLE) IMPLANT
SOLUTION PARTIC MCRMTRX 1000MG (Tissue) IMPLANT
SPONGE GAUZE 4X4 12PLY (GAUZE/BANDAGES/DRESSINGS) ×12 IMPLANT
SUT ETHILON 2 0 FS 18 (SUTURE) ×4 IMPLANT
SUT ETHILON O TP 1 (SUTURE) ×2 IMPLANT
SUT VIC AB 0 CT1 27 (SUTURE) ×3
SUT VIC AB 0 CT1 27XBRD ANBCTR (SUTURE) IMPLANT
SUT VIC AB 5-0 PS2 18 (SUTURE) ×2 IMPLANT
TOWEL OR 17X24 6PK STRL BLUE (TOWEL DISPOSABLE) ×3 IMPLANT
TOWEL OR 17X26 10 PK STRL BLUE (TOWEL DISPOSABLE) ×3 IMPLANT
UNDERPAD 30X30 INCONTINENT (UNDERPADS AND DIAPERS) ×5 IMPLANT
WATER STERILE IRR 1000ML POUR (IV SOLUTION) IMPLANT

## 2014-03-03 NOTE — Transfer of Care (Signed)
Immediate Anesthesia Transfer of Care Note  Patient: Kelly [name]  Procedure(s) Performed: Procedure(s): IRRIGATION AND DEBRIDEMENT LEFT LEG WOUND AND PLACEMENT OF A CELL AND VAC (Left)  Patient Location: PACU  Anesthesia Type:MAC  Level of Consciousness: awake, alert , oriented and patient cooperative  Airway & Oxygen Therapy: Patient Spontanous Breathing and Patient connected to nasal cannula oxygen  Post-op Assessment: Report given to PACU RN and Post -op Vital signs reviewed and stable  Post vital signs: Reviewed and stable  Complications: No apparent anesthesia complications

## 2014-03-03 NOTE — H&P (Addendum)
[facility] History and Physical  BRIELL PAULETTE MGQ:[id number] DOB: August 29, 1945 DOA: 03/03/2014  Referring physician: Dr Migdalia Dk PCP: Alvester Chou, NP   Chief Complaint: hyperkalemia.  HPI: Kelly [name] is a 68 y.o. female with prior h/o chronic diastolic heart failure, hypertension, chronic left leg wound, diabetes Mellitus, GERD, has h/o PE, not on anticoagulation secondary to bleeding from the left leg wound, s/o IVC filter placement, chronic anemia, morbid obesity  came in for debridement of the left leg wound by Dr Migdalia Dk and she underwent wound VAC placed today. Post operatively she was found to have have hyperkalemia, but her blood sample was hemolysed. We repeated the BMP and she was found to have persistent hyperkalemia. She was then referred to [place] for admission. Patient currently denies any chest pain, palpitations, nausea or vomiting. She reports frequent loose stools and is on miralax. She also reports occasional dizziness. She has been mostly bed bound for over 10 days and yesterday was the first time she sat on the bed and reports feeling dizzy. She denies any headache, tingling or numbness of her extremities. She will be admitted to the [facility] service for hyperkalemia. Of note she is not taking any potassium supplements or is not on any ACE inhibitor or ARB's.    Review of Systems:   See hpi otherwise negative  Past Medical History  Diagnosis Date  . Hypertension   . Hyperlipidemia   . CHF (congestive heart failure)   . GERD (gastroesophageal reflux disease)   . Depression   . Sleep apnea     doesn't use cpap machine or 02 at night  . Headache(784.0)     Migraines  . Arthritis     knees  . Anemia     Had blood transfusion x 2 in yhe 90s  . Lymphedema of leg   . Pulmonary embolism 02/18/14    diagnosed at [facility] with CT angio chest  . Chronic indwelling Foley catheter   . Diabetes mellitus     not on medications   Past Surgical History  Procedure Laterality Date   . Rotator cuff repair      right  . Knee arthroscopy      right  . Temple biopsy    . Colonoscopy with propofol  09/23/2012    Procedure: COLONOSCOPY WITH PROPOFOL;  Surgeon: Jerene Bears, MD;  Location: [facility];  Service: Gastroenterology;  Laterality: N/A;  . Paniculectomy with wound debridement Left 02/08/2014    left medial thigh  . Irrigation and debridement abscess Left 02/24/2014    Procedure: MINOR INCISION AND DRAINAGE OF ABSCESS;  Surgeon: Theodoro Kos, DO;  Location: [facility];  Service: Plastics;  Laterality: Left;   Social History:  reports that she quit smoking about 32 years ago. Her smoking use included Cigarettes. She smoked 0.00 packs per day. She has never used smokeless tobacco. She reports that she does not drink alcohol or use illicit drugs.  Allergies  Allergen Reactions  . Penicillins Hives    Family History  Problem Relation Age of Onset  . Emphysema Father   . Asthma Brother   . Heart disease Mother   . Stomach cancer Brother   . Heart disease Maternal Grandmother   . Diabetes Mother   . Diabetes Sister      Prior to Admission medications   Medication Sig Start Date End Date Taking? Authorizing Provider  allopurinol (ZYLOPRIM) 100 MG tablet Take 100 mg by mouth daily.    Yes Historical  Provider, MD  aspirin 325 MG EC tablet Take 325 mg by mouth daily.   Yes Historical Provider, MD  atorvastatin (LIPITOR) 40 MG tablet Take 40 mg by mouth daily.    Yes Historical Provider, MD  chlorthalidone (HYGROTON) 25 MG tablet Take 12.5 mg by mouth daily.   Yes Historical Provider, MD  esomeprazole (NEXIUM) 40 MG capsule Take 40 mg by mouth daily before breakfast.   Yes Historical Provider, MD  gabapentin (NEURONTIN) 300 MG capsule Take 300 mg by mouth 3 (three) times daily.   Yes Historical Provider, MD  isosorbide mononitrate (ISMO,MONOKET) 10 MG tablet Take 10 mg by mouth 2 (two) times daily.   Yes Historical Provider, MD  magnesium oxide (MAG-OX) 400 MG tablet  Take 400 mg by mouth daily.    Yes Historical Provider, MD  metoprolol tartrate (LOPRESSOR) 25 MG tablet Take 37.5 mg by mouth 2 (two) times daily.    Yes Historical Provider, MD  oxyCODONE (OXY IR/ROXICODONE) 5 MG immediate release tablet Take 1 tablet (5 mg total) by mouth every 4 (four) hours as needed for severe pain (for pain). 03/02/14  Yes Mahima Pandey, MD  polyethylene glycol (MIRALAX / GLYCOLAX) packet Take 17 g by mouth daily.   Yes Historical Provider, MD  promethazine (PHENERGAN) 25 MG tablet Take 25 mg by mouth 2 (two) times daily as needed for nausea or vomiting.   Yes Historical Provider, MD  sulfamethoxazole-trimethoprim (BACTRIM DS) 800-160 MG per tablet Take 1 tablet by mouth every 12 (twelve) hours. For 5 days 02/26/14  Yes Erline Hau, MD  zolpidem (AMBIEN CR) 6.25 MG CR tablet Take 1 tablet (6.25 mg total) by mouth at bedtime as needed for sleep. 03/02/14  Yes Estill Dooms, MD   Physical Exam: Filed Vitals:   03/03/14 1827  BP:   Pulse:   Temp: 98.7 F (37.1 C)  Resp:     BP 154/54  Pulse 78  Temp(Src) 98.7 F (37.1 C) (Oral)  Resp 19  Ht 5\' 5"  (1.651 m)  Wt 148.78 kg (328 lb)  BMI 54.58 kg/m2  SpO2 98%  General:  Appears calm and comfortable Eyes: PERRL, normal lids, irises & conjunctiva Cardiovascular: RRR, no m/r/g. No LE edema. Telemetry: SR, no arrhythmias  Respiratory: CTA bilaterally, no w/r/r. Normal respiratory effort. Abdomen: soft, ntnd  Musculoskeletal: Chronic left  Leg wound with wound vac.  Neurologic: alert and oriented to place person and time, no focal deficits, speech normal. No facial droop, able to move extremities . Could not walk her in PACU.           Labs on Admission:  Basic Metabolic Panel:  Recent Labs Lab 02/25/14 0345 02/26/14 0520 03/03/14 0845 03/03/14 1022 03/03/14 1122 03/03/14 1500 03/03/14 1655  NA 136* 136* 136* 136* 137 132* 135*  K 5.8* 5.7* 6.5* 6.2* 5.6* 6.6* 6.6*  CL 108 107 105  --   --   101 104  CO2 20 19 20   --   --  18* 19  GLUCOSE 146* 106* 86 88 114* 111* 118*  BUN 31* 31* 23  --   --  22 22  CREATININE 1.42* 1.38* 1.44*  --   --  1.46* 1.41*  CALCIUM 7.7* 7.9* 8.3*  --   --  8.3* 8.1*   Liver Function Tests: No results found for this basename: AST, ALT, ALKPHOS, BILITOT, PROT, ALBUMIN,  in the last 168 hours No results found for this basename: LIPASE, AMYLASE,  in  the last 168 hours No results found for this basename: AMMONIA,  in the last 168 hours CBC:  Recent Labs Lab 02/25/14 0345 02/26/14 0520 03/03/14 0845 03/03/14 1022 03/03/14 1122  WBC 10.4 9.0 8.1  --   --   HGB 7.9* 8.1* 8.8* 8.8* 8.5*  HCT 24.4* 24.6* 27.2* 26.0* 25.0*  MCV 90.4 90.4 90.4  --   --   PLT 259 280 364  --   --    Cardiac Enzymes: No results found for this basename: CKTOTAL, CKMB, CKMBINDEX, TROPONINI,  in the last 168 hours  BNP (last 3 results) No results found for this basename: PROBNP,  in the last 8760 hours CBG:  Recent Labs Lab 02/25/14 1557 02/25/14 2129 02/26/14 1145 03/03/14 0854 03/03/14 1120  GLUCAP 155* 133* 145* 88 123*    Radiological Exams on Admission: Dg Chest 2 View  03/03/2014   CLINICAL DATA:  Preoperative evaluation for wound debridement  EXAM: CHEST  2 VIEW  COMPARISON:  11/17/2012  FINDINGS: Enlargement of cardiac silhouette with pulmonary vascular congestion.  Accentuation of perihilar markings unchanged.  Minimal LEFT basilar subsegmental atelectasis.  No definite acute infiltrate, pleural effusion or pneumothorax.  Diffuse idiopathic skeletal hyperostosis.  IMPRESSION: Enlargement of cardiac silhouette with pulmonary vascular congestion.  Minimal LEFT basilar atelectasis.   Electronically Signed   By: Lavonia Dana M.D.   On: 03/03/2014 09:29    EKG: sinus with RBBB, LAFB  Assessment/Plan Active Problems:   HYPERTENSION   Diabetes mellitus type 2 with neurological manifestations   Hyperkalemia   Hyperkalemia: of unclear etiology.  EKG  shows tall T waves , not peaked.  Admit to  Telemetry overnight.  Ordered total of 2 doses of 30 gms of kayexalate. No BM yet.  One dose of calcium gluconate ordered.  10 units of insulin and dextrose will be ordered.  One dose of lasix will be ordered.    Hypertension: - resume home meds.  - sub optimally controlled.   Diabetes Mellitus: - diet controlled.  - hgba1c is 1.0%  Chronic diastolic heart failure: - she appears to be compensated.    Recent h/o PE: - not on anticoagulation secondary to bleeding from the left leg wound.  - s/p IVC FILTER.  - she denies any sob or chest pain or palpitations.  - she is not requiring oxygen.   Chronic leag leg wound on wound vac: unclear etiology.  OSA: - Not on CPAP  GERD: On PPI.   dvt Prophylaxis.       Code Status: full code.  Family Communication: none at bedside, discussed with thpatient Disposition Plan: admit to telemetry  Time spent: 55 min  [facility] Pager [phone]  **Disclaimer: This note may have been dictated with voice recognition software. Similar sounding words can inadvertently be transcribed and this note may contain transcription errors which may not have been corrected upon publication of note.**

## 2014-03-03 NOTE — Interval H&P Note (Signed)
History and Physical Interval Note:  03/03/2014 7:43 AM  Kelly [name]  has presented today for surgery, with the diagnosis of left leg wound  The various methods of treatment have been discussed with the patient and family. After consideration of risks, benefits and other options for treatment, the patient has consented to  Procedure(s): IRRIGATION AND DEBRIDEMENT LEFT LEG WOUND AND PLACEMENT OF A CELL AND VAC (Left) as a surgical intervention .  The patient's history has been reviewed, patient examined, no change in status, stable for surgery.  I have reviewed the patient's chart and labs.  Questions were answered to the patient's satisfaction.     SANGER,Turquoise Esch

## 2014-03-03 NOTE — Progress Notes (Signed)
Received pt from pacu awake and alert. Denies c/o incision pain. Wound vac functioning. Left leg warm with pos pedal pulse. In no distress

## 2014-03-03 NOTE — Discharge Instructions (Signed)
VAC to 125 mmHg pressure continuous

## 2014-03-03 NOTE — H&P (View-Only) (Signed)
1 Day Post-Op  Subjective: The patient is doing well overall and her pain is much improved with the VAC on the wound.  No sign of infection.  Objective: Vital signs in last 24 hours: Temp:  [98.4 F (36.9 C)-99.9 F (37.7 C)] 98.5 F (36.9 C) (07/02 1100) Pulse Rate:  [51-96] 77 (07/02 1603) Resp:  [13-23] 13 (07/02 1603) BP: (96-179)/(30-127) 96/30 mmHg (07/02 1603) SpO2:  [93 %-100 %] 100 % (07/02 1603) Last BM Date: 02/22/14  Intake/Output from previous day: 07/01 0701 - 07/02 0700 In: 800 [I.V.:500; IV Piggyback:300] Out: 1675 [Urine:875; Drains:600; Blood:200] Intake/Output this shift: Total I/O In: 100 [IV Piggyback:100] Out: 575 [Urine:375; Drains:200]  General appearance: alert, cooperative and no distress  Lab Results:   Recent Labs  02/24/14 0335 02/25/14 0345  WBC 10.1 10.4  HGB 8.8* 7.9*  HCT 26.2* 24.4*  PLT 266 259   BMET  Recent Labs  02/24/14 0335 02/25/14 0345  NA 137 136*  K 5.2 5.8*  CL 109 108  CO2 18* 20  GLUCOSE 180* 146*  BUN 31* 31*  CREATININE 1.31* 1.42*  CALCIUM 7.8* 7.7*   PT/INR No results found for this basename: LABPROT, INR,  in the last 72 hours ABG No results found for this basename: PHART, PCO2, PO2, HCO3,  in the last 72 hours  Studies/Results: Ir Ivc Filter Plmt / S&i /img Guid/mod Sed  02/23/2014   CLINICAL DATA:  History of pulmonary embolism. Anticoagulation cannot be continued due to bleeding from the left lower extremity. Request is been made to place an IVC filter.  EXAM: 1. ULTRASOUND GUIDANCE FOR VASCULAR ACCESS OF THE RIGHT INTERNAL JUGULAR VEIN. 2. IVC VENOGRAM. 3. PERCUTANEOUS IVC FILTER PLACEMENT.  ANESTHESIA/SEDATION: 2.0 mg IV Versed; 100 mcg IV Fentanyl.  Total Moderate Sedation Time  51minutes.  CONTRAST:  CO2  FLUOROSCOPY TIME:  48 seconds.  PROCEDURE: The procedure, risks, benefits, and alternatives were explained to the patient. Questions regarding the procedure were encouraged and answered. The  patient understands and consents to the procedure.  The right neck was prepped with Betadine in a sterile fashion, and a sterile drape was applied covering the operative field. A sterile gown and sterile gloves were used for the procedure. Local anesthesia was provided with 1% Lidocaine.  Ultrasound was used to confirm patency of the right internal jugular vein. Under direct ultrasound guidance, a 21 gauge needle was advanced into the right internal jugular vein with ultrasound image documentation performed. After securing access with a micropuncture dilator, a guidewire was advanced into the inferior vena cava. A deployment sheath was advanced over the guidewire. This was utilized to perform IVC venography.  The deployment sheath was further positioned in an appropriate location for filter deployment. A Bard Denali IVC filter was then advanced in the sheath. This was then fully deployed in the infrarenal IVC. Final filter position was confirmed with a fluoroscopic spot image. Contrast injection was also performed through the sheath under fluoroscopy to confirm patency of the IVC at the level of the filter. After the procedure the sheath was removed and hemostasis obtained with manual compression.  COMPLICATIONS: None.  FINDINGS: IVC venography demonstrates a normal caliber IVC with no evidence of thrombus. Renal veins are identified bilaterally. The IVC filter was successfully positioned below the level of the renal veins and is appropriately oriented. This IVC filter has both permanent and retrievable indications.  IMPRESSION: Placement of percutaneous IVC filter in infrarenal IVC. IVC venogram shows no evidence of IVC  thrombus and normal caliber of the inferior vena cava. This filter does have both permanent and retrievable indications.   Electronically Signed   By: Aletta Edouard M.D.   On: 02/23/2014 17:19    Anti-infectives: Anti-infectives   Start     Dose/Rate Route Frequency Ordered Stop   02/25/14  1315  sulfamethoxazole-trimethoprim (BACTRIM DS) 800-160 MG per tablet 1 tablet     1 tablet Oral Every 12 hours 02/25/14 1313     02/24/14 1000  imipenem-cilastatin (PRIMAXIN) 500 mg in sodium chloride 0.9 % 100 mL IVPB  Status:  Discontinued     500 mg 200 mL/hr over 30 Minutes Intravenous Every 8 hours 02/24/14 0751 02/25/14 1313   02/24/14 0928  polymyxin B 500,000 Units, bacitracin 50,000 Units in sodium chloride irrigation 0.9 % 500 mL irrigation  Status:  Discontinued       As needed 02/24/14 0943 02/24/14 1020   02/21/14 2200  vancomycin (VANCOCIN) 1,750 mg in sodium chloride 0.9 % 500 mL IVPB  Status:  Discontinued     1,750 mg 250 mL/hr over 120 Minutes Intravenous Every 24 hours 02/21/14 0551 02/24/14 0727   02/21/14 1800  ceFEPIme (MAXIPIME) 2 g in dextrose 5 % 50 mL IVPB  Status:  Discontinued     2 g 100 mL/hr over 30 Minutes Intravenous Every 12 hours 02/21/14 0553 02/24/14 0727   02/20/14 2345  vancomycin (VANCOCIN) 1,500 mg in sodium chloride 0.9 % 500 mL IVPB     1,500 mg 250 mL/hr over 120 Minutes Intravenous  Once 02/20/14 2341 02/25/14 0731   02/20/14 2345  ceFEPIme (MAXIPIME) 2 g in dextrose 5 % 50 mL IVPB     2 g 100 mL/hr over 30 Minutes Intravenous  Once 02/20/14 2344 02/21/14 0714      Assessment/Plan: s/p Procedure(s): MINOR INCISION AND DRAINAGE OF ABSCESS (Left) Discharge per primary team. Have the patient on the schedule for VAC change in the OR on Monday [facility].  Can change that to Outpatient if the patient is discharge.  Will plan to do VAC changes in the OR weekly for around one month.  LOS: 5 days    [facility] 02/25/2014

## 2014-03-03 NOTE — Anesthesia Postprocedure Evaluation (Signed)
  Anesthesia Post-op Note  Patient: Kelly [name]  Procedure(s) Performed: Procedure(s): IRRIGATION AND DEBRIDEMENT LEFT LEG WOUND AND PLACEMENT OF A CELL AND VAC (Left)  Patient Location: PACU  Anesthesia Type:General  Level of Consciousness: awake  Airway and Oxygen Therapy: Patient Spontanous Breathing  Post-op Pain: mild  Post-op Assessment: Post-op Vital signs reviewed  Post-op Vital Signs: Reviewed  Last Vitals:  Filed Vitals:   03/03/14 1545  BP:   Pulse: 71  Temp:   Resp: 17    Complications: No apparent anesthesia complications

## 2014-03-03 NOTE — Consult Note (Addendum)
[facility] Medical Consultation  ABERDEEN HAFEN YQM:[id number] DOB: 06/02/1946 DOA: 03/03/2014 PCP: Alvester Chou, NP   Requesting physician: DR Sanger Date of consultation: 7/8 Reason for consultation: Hyperkalemia  Impression/Recommendations Active Problems: Hyperkalemia: - possibly from hemolysis.  - she was given insulin and dextrose. She also received one dose of kayexalate. Her 12 LEAD EKG does not show peaked t waves.  - repeat BMP is pending.  - recommend one dose of calcium gluconate if the repeat level is high.   Thank you for this consultation. I will follow up and please call me if you have any questions.     HPI:  68 year old lady with h/o hypertension, DM, chronic diastolic heart failure camein for debridement of the left leg wound with VAC placement and her pre op labs revealed hyperkalemia on istat labs. Her potassium was repeated and it was found to be 5.6. hospitalist was consulted for further evaluation. Patient denies any chest pain or palpitations. She denies taking any oral potassium supplements. She is not on any ACE inhibitors.  A repeat BMP was ordered , but the sample was hemolysed . We are currently waiting for a repeat potassium level.   Review of Systems:  Negative see hpi.  Past Medical History  Diagnosis Date  . Hypertension   . Hyperlipidemia   . CHF (congestive heart failure)   . GERD (gastroesophageal reflux disease)   . Depression   . Sleep apnea     doesn't use cpap machine or 02 at night  . Headache(784.0)     Migraines  . Arthritis     knees  . Anemia     Had blood transfusion x 2 in yhe 90s  . Lymphedema of leg   . Pulmonary embolism 02/18/14    diagnosed at [facility] with CT angio chest  . Chronic indwelling Foley catheter   . Diabetes mellitus     not on medications   Past Surgical History  Procedure Laterality Date  . Rotator cuff repair      right  . Knee arthroscopy      right  . Temple biopsy    . Colonoscopy with  propofol  09/23/2012    Procedure: COLONOSCOPY WITH PROPOFOL;  Surgeon: Jerene Bears, MD;  Location: [facility];  Service: Gastroenterology;  Laterality: N/A;  . Paniculectomy with wound debridement Left 02/08/2014    left medial thigh  . Irrigation and debridement abscess Left 02/24/2014    Procedure: MINOR INCISION AND DRAINAGE OF ABSCESS;  Surgeon: Theodoro Kos, DO;  Location: [facility];  Service: Plastics;  Laterality: Left;   Social History:  reports that she quit smoking about 32 years ago. Her smoking use included Cigarettes. She smoked 0.00 packs per day. She has never used smokeless tobacco. She reports that she does not drink alcohol or use illicit drugs.  Allergies  Allergen Reactions  . Penicillins Hives   Family History  Problem Relation Age of Onset  . Emphysema Father   . Asthma Brother   . Heart disease Mother   . Stomach cancer Brother   . Heart disease Maternal Grandmother   . Diabetes Mother   . Diabetes Sister     Prior to Admission medications   Medication Sig Start Date End Date Taking? Authorizing Provider  allopurinol (ZYLOPRIM) 100 MG tablet Take 100 mg by mouth daily.    Yes Historical Provider, MD  aspirin 325 MG EC tablet Take 325 mg by mouth daily.   Yes Historical  Provider, MD  atorvastatin (LIPITOR) 40 MG tablet Take 40 mg by mouth daily.    Yes Historical Provider, MD  chlorthalidone (HYGROTON) 25 MG tablet Take 12.5 mg by mouth daily.   Yes Historical Provider, MD  esomeprazole (NEXIUM) 40 MG capsule Take 40 mg by mouth daily before breakfast.   Yes Historical Provider, MD  gabapentin (NEURONTIN) 300 MG capsule Take 300 mg by mouth 3 (three) times daily.   Yes Historical Provider, MD  isosorbide mononitrate (ISMO,MONOKET) 10 MG tablet Take 10 mg by mouth 2 (two) times daily.   Yes Historical Provider, MD  magnesium oxide (MAG-OX) 400 MG tablet Take 400 mg by mouth daily.    Yes Historical Provider, MD  metoprolol tartrate (LOPRESSOR) 25 MG tablet Take  37.5 mg by mouth 2 (two) times daily.    Yes Historical Provider, MD  oxyCODONE (OXY IR/ROXICODONE) 5 MG immediate release tablet Take 1 tablet (5 mg total) by mouth every 4 (four) hours as needed for severe pain (for pain). 03/02/14  Yes Mahima Pandey, MD  polyethylene glycol (MIRALAX / GLYCOLAX) packet Take 17 g by mouth daily.   Yes Historical Provider, MD  promethazine (PHENERGAN) 25 MG tablet Take 25 mg by mouth 2 (two) times daily as needed for nausea or vomiting.   Yes Historical Provider, MD  sulfamethoxazole-trimethoprim (BACTRIM DS) 800-160 MG per tablet Take 1 tablet by mouth every 12 (twelve) hours. For 5 days 02/26/14  Yes Erline Hau, MD  zolpidem (AMBIEN CR) 6.25 MG CR tablet Take 1 tablet (6.25 mg total) by mouth at bedtime as needed for sleep. 03/02/14  Yes Estill Dooms, MD   Physical Exam: Blood pressure 138/56, pulse 69, temperature 97 F (36.1 C), temperature source Oral, resp. rate 26, height 5\' 5"  (1.651 m), weight 148.78 kg (328 lb), SpO2 97.00%. Filed Vitals:   03/03/14 1330  BP:   Pulse: 69  Temp:   Resp: 26     General:  Alert afebrile comfortable , denies any complaints  Eyes: PERLA pale conjuctive  Cardiovascular: s1s2 RRR  Respiratory: ctab  Abdomen: soft NT ND  Musculoskeletal: pedal edema  Neurologic: no facial droop, speech normal. Able to move her extremities. She is in PACU and could not walk.   Labs on Admission:  Basic Metabolic Panel:  Recent Labs Lab 02/25/14 0345 02/26/14 0520 03/03/14 0845 03/03/14 1022 03/03/14 1122  NA 136* 136* 136* 136* 137  K 5.8* 5.7* 6.5* 6.2* 5.6*  CL 108 107 105  --   --   CO2 20 19 20   --   --   GLUCOSE 146* 106* 86 88 114*  BUN 31* 31* 23  --   --   CREATININE 1.42* 1.38* 1.44*  --   --   CALCIUM 7.7* 7.9* 8.3*  --   --    Liver Function Tests: No results found for this basename: AST, ALT, ALKPHOS, BILITOT, PROT, ALBUMIN,  in the last 168 hours No results found for this basename:  LIPASE, AMYLASE,  in the last 168 hours No results found for this basename: AMMONIA,  in the last 168 hours CBC:  Recent Labs Lab 02/25/14 0345 02/26/14 0520 03/03/14 0845 03/03/14 1022 03/03/14 1122  WBC 10.4 9.0 8.1  --   --   HGB 7.9* 8.1* 8.8* 8.8* 8.5*  HCT 24.4* 24.6* 27.2* 26.0* 25.0*  MCV 90.4 90.4 90.4  --   --   PLT 259 280 364  --   --  Cardiac Enzymes: No results found for this basename: CKTOTAL, CKMB, CKMBINDEX, TROPONINI,  in the last 168 hours BNP: No components found with this basename: POCBNP,  CBG:  Recent Labs Lab 02/25/14 1557 02/25/14 2129 02/26/14 1145 03/03/14 0854 03/03/14 1120  GLUCAP 155* 133* 145* 88 123*    Radiological Exams on Admission: Dg Chest 2 View  03/03/2014   CLINICAL DATA:  Preoperative evaluation for wound debridement  EXAM: CHEST  2 VIEW  COMPARISON:  11/17/2012  FINDINGS: Enlargement of cardiac silhouette with pulmonary vascular congestion.  Accentuation of perihilar markings unchanged.  Minimal LEFT basilar subsegmental atelectasis.  No definite acute infiltrate, pleural effusion or pneumothorax.  Diffuse idiopathic skeletal hyperostosis.  IMPRESSION: Enlargement of cardiac silhouette with pulmonary vascular congestion.  Minimal LEFT basilar atelectasis.   Electronically Signed   By: Lavonia Dana M.D.   On: 03/03/2014 09:29    EKG: sinus with RBBB and LAFB  Time spent: [facility] Pager [phone]  If 7PM-7AM, please contact night-coverage [email or web address] Password [email or web address] 03/03/2014, 2:05 PM

## 2014-03-03 NOTE — Brief Op Note (Signed)
03/03/2014  10:51 AM  PATIENT:  Kelly [name]  68 y.o. female  PRE-OPERATIVE DIAGNOSIS:  left leg wound  POST-OPERATIVE DIAGNOSIS:  * No post-op diagnosis entered *  PROCEDURE:  Procedure(s): IRRIGATION AND DEBRIDEMENT LEFT LEG WOUND AND PLACEMENT OF A CELL AND VAC (Left)  SURGEON:  Surgeon(s) and Role:    * Leaha Cuervo Sanger, DO - Primary  PHYSICIAN ASSISTANT: Shawn Rayburn, PA  ASSISTANTS: none   ANESTHESIA:   sedation  EBL:  Total I/O In: 300 [I.V.:300] Out: -   BLOOD ADMINISTERED:none  DRAINS: none   LOCAL MEDICATIONS USED:  NONE  SPECIMEN:  No Specimen  DISPOSITION OF SPECIMEN:  N/A  COUNTS:  YES  TOURNIQUET:  * No tourniquets in log *  DICTATION: .Dragon Dictation  PLAN OF CARE: Discharge to home after PACU  PATIENT DISPOSITION:  PACU - hemodynamically stable.   Delay start of Pharmacological VTE agent (>24hrs) due to surgical blood loss or risk of bleeding: no

## 2014-03-03 NOTE — Op Note (Signed)
Operative Note   DATE OF OPERATION: 03/03/2014  LOCATION: [place]  SURGICAL DIVISION: Plastic Surgery  PREOPERATIVE DIAGNOSES:  Large left leg wound  POSTOPERATIVE DIAGNOSES:  same  PROCEDURE:  Preparation of wound bed for placement of Acell (3 gm powder and 10 x 15 cm) and VAC placement (18 x 8 x 5 cm)  SURGEON: [employer], DO  ASSISTANT: Shawn Rayburn, PA  ANESTHESIA:  General.   COMPLICATIONS: None.   INDICATIONS FOR PROCEDURE:  The patient, Kelly [name] is a 68 y.o. female born on 24-Dec-1945, is here for treatment of left leg wound. MRN: [id number]  CONSENT:  Informed consent was obtained directly from the patient. Risks, benefits and alternatives were fully discussed. Specific risks including but not limited to bleeding, infection, hematoma, seroma, scarring, pain, infection, contracture, asymmetry, wound healing problems, and need for further surgery were all discussed. The patient did have an ample opportunity to have questions answered to satisfaction.   DESCRIPTION OF PROCEDURE:  The patient was taken to the operating room. The patient's operative site was prepped and draped in a sterile fashion. A time out was performed and all information was confirmed to be correct.  Sedation was administered.  The area was irrigated.  The scissors and pickups were used to irrigate the soft tissue.  Hemostasis was achieved with pressure.  The Acell powder was placed followed by the Acell sheet.  The adaptic was applied and the VAC.  There was an excellent seal.  The patient tolerated the procedure well.  There were no complications. The patient was allowed to wake and taken to the recovery room in satisfactory condition.

## 2014-03-03 NOTE — Anesthesia Preprocedure Evaluation (Addendum)
Anesthesia Evaluation  Patient identified by MRN, date of birth, ID band Patient awake    Reviewed: Allergy & Precautions, H&P , NPO status , Patient's Chart, lab work & pertinent test results  Airway Mallampati: II      Dental   Pulmonary sleep apnea , former smoker,          Cardiovascular hypertension, + Peripheral Vascular Disease and +CHF     Neuro/Psych  Headaches, Anxiety    GI/Hepatic GERD-  ,  Endo/Other  diabetes  Renal/GU Renal disease     Musculoskeletal   Abdominal   Peds  Hematology   Anesthesia Other Findings   Reproductive/Obstetrics                          Anesthesia Physical Anesthesia Plan  ASA: IV and emergent  Anesthesia Plan: General   Post-op Pain Management:    Induction: Intravenous  Airway Management Planned: Oral ETT  Additional Equipment:   Intra-op Plan:   Post-operative Plan: Possible Post-op intubation/ventilation  Informed Consent: I have reviewed the patients History and Physical, chart, labs and discussed the procedure including the risks, benefits and alternatives for the proposed anesthesia with the patient or authorized representative who has indicated his/her understanding and acceptance.   Dental advisory given  Plan Discussed with: CRNA and Anesthesiologist  Anesthesia Plan Comments:        Anesthesia Quick Evaluation

## 2014-03-03 NOTE — Progress Notes (Signed)
Awaiting for Hospitalist to come see patient.

## 2014-03-04 DIAGNOSIS — E785 Hyperlipidemia, unspecified: Secondary | ICD-10-CM | POA: Diagnosis present

## 2014-03-04 DIAGNOSIS — G4733 Obstructive sleep apnea (adult) (pediatric): Secondary | ICD-10-CM | POA: Diagnosis present

## 2014-03-04 DIAGNOSIS — N183 Chronic kidney disease, stage 3 unspecified: Secondary | ICD-10-CM

## 2014-03-04 DIAGNOSIS — I509 Heart failure, unspecified: Secondary | ICD-10-CM | POA: Diagnosis present

## 2014-03-04 DIAGNOSIS — Z8249 Family history of ischemic heart disease and other diseases of the circulatory system: Secondary | ICD-10-CM | POA: Diagnosis not present

## 2014-03-04 DIAGNOSIS — Z6841 Body Mass Index (BMI) 40.0 and over, adult: Secondary | ICD-10-CM | POA: Diagnosis not present

## 2014-03-04 DIAGNOSIS — Z86711 Personal history of pulmonary embolism: Secondary | ICD-10-CM | POA: Diagnosis not present

## 2014-03-04 DIAGNOSIS — F329 Major depressive disorder, single episode, unspecified: Secondary | ICD-10-CM | POA: Diagnosis present

## 2014-03-04 DIAGNOSIS — Z7401 Bed confinement status: Secondary | ICD-10-CM | POA: Diagnosis not present

## 2014-03-04 DIAGNOSIS — E1149 Type 2 diabetes mellitus with other diabetic neurological complication: Secondary | ICD-10-CM

## 2014-03-04 DIAGNOSIS — E662 Morbid (severe) obesity with alveolar hypoventilation: Secondary | ICD-10-CM

## 2014-03-04 DIAGNOSIS — Z880 Allergy status to penicillin: Secondary | ICD-10-CM | POA: Diagnosis not present

## 2014-03-04 DIAGNOSIS — F411 Generalized anxiety disorder: Secondary | ICD-10-CM | POA: Diagnosis present

## 2014-03-04 DIAGNOSIS — Z87891 Personal history of nicotine dependence: Secondary | ICD-10-CM | POA: Diagnosis not present

## 2014-03-04 DIAGNOSIS — F3289 Other specified depressive episodes: Secondary | ICD-10-CM | POA: Diagnosis present

## 2014-03-04 DIAGNOSIS — I5032 Chronic diastolic (congestive) heart failure: Secondary | ICD-10-CM | POA: Diagnosis present

## 2014-03-04 DIAGNOSIS — K219 Gastro-esophageal reflux disease without esophagitis: Secondary | ICD-10-CM | POA: Diagnosis present

## 2014-03-04 DIAGNOSIS — I129 Hypertensive chronic kidney disease with stage 1 through stage 4 chronic kidney disease, or unspecified chronic kidney disease: Secondary | ICD-10-CM | POA: Diagnosis present

## 2014-03-04 DIAGNOSIS — Z23 Encounter for immunization: Secondary | ICD-10-CM | POA: Diagnosis not present

## 2014-03-04 DIAGNOSIS — E875 Hyperkalemia: Secondary | ICD-10-CM | POA: Diagnosis present

## 2014-03-04 DIAGNOSIS — Z825 Family history of asthma and other chronic lower respiratory diseases: Secondary | ICD-10-CM | POA: Diagnosis not present

## 2014-03-04 DIAGNOSIS — Z800 Family history of malignant neoplasm of digestive organs: Secondary | ICD-10-CM | POA: Diagnosis not present

## 2014-03-04 DIAGNOSIS — Z7982 Long term (current) use of aspirin: Secondary | ICD-10-CM | POA: Diagnosis not present

## 2014-03-04 DIAGNOSIS — L97909 Non-pressure chronic ulcer of unspecified part of unspecified lower leg with unspecified severity: Secondary | ICD-10-CM | POA: Diagnosis present

## 2014-03-04 DIAGNOSIS — N039 Chronic nephritic syndrome with unspecified morphologic changes: Secondary | ICD-10-CM

## 2014-03-04 DIAGNOSIS — D631 Anemia in chronic kidney disease: Secondary | ICD-10-CM

## 2014-03-04 DIAGNOSIS — M1710 Unilateral primary osteoarthritis, unspecified knee: Secondary | ICD-10-CM | POA: Diagnosis present

## 2014-03-04 LAB — CBC
HEMATOCRIT: 27.4 % — AB (ref 36.0–46.0)
HEMOGLOBIN: 8.8 g/dL — AB (ref 12.0–15.0)
MCH: 29.6 pg (ref 26.0–34.0)
MCHC: 32.1 g/dL (ref 30.0–36.0)
MCV: 92.3 fL (ref 78.0–100.0)
Platelets: 326 10*3/uL (ref 150–400)
RBC: 2.97 MIL/uL — ABNORMAL LOW (ref 3.87–5.11)
RDW: 14.7 % (ref 11.5–15.5)
WBC: 6.8 10*3/uL (ref 4.0–10.5)

## 2014-03-04 LAB — GLUCOSE, CAPILLARY: Glucose-Capillary: 134 mg/dL — ABNORMAL HIGH (ref 70–99)

## 2014-03-04 LAB — BASIC METABOLIC PANEL
Anion gap: 13 (ref 5–15)
Anion gap: 13 (ref 5–15)
Anion gap: 16 — ABNORMAL HIGH (ref 5–15)
BUN: 18 mg/dL (ref 6–23)
BUN: 17 mg/dL (ref 6–23)
BUN: 20 mg/dL (ref 6–23)
CHLORIDE: 104 meq/L (ref 96–112)
CO2: 20 meq/L (ref 19–32)
CO2: 21 meq/L (ref 19–32)
CO2: 22 meq/L (ref 19–32)
Calcium: 7.8 mg/dL — ABNORMAL LOW (ref 8.4–10.5)
Calcium: 8.3 mg/dL — ABNORMAL LOW (ref 8.4–10.5)
Calcium: 8.5 mg/dL (ref 8.4–10.5)
Chloride: 103 mEq/L (ref 96–112)
Chloride: 104 mEq/L (ref 96–112)
Creatinine, Ser: 1.34 mg/dL — ABNORMAL HIGH (ref 0.50–1.10)
Creatinine, Ser: 1.37 mg/dL — ABNORMAL HIGH (ref 0.50–1.10)
Creatinine, Ser: 1.38 mg/dL — ABNORMAL HIGH (ref 0.50–1.10)
GFR calc Af Amer: 45 mL/min — ABNORMAL LOW (ref >90)
GFR calc Af Amer: 45 mL/min — ABNORMAL LOW (ref >90)
GFR calc Af Amer: 47 mL/min — ABNORMAL LOW (ref >90)
GFR calc non Af Amer: 39 mL/min — ABNORMAL LOW (ref >90)
GFR calc non Af Amer: 40 mL/min — ABNORMAL LOW (ref >90)
GFR, EST NON AFRICAN AMERICAN: 39 mL/min — AB (ref >90)
GLUCOSE: 104 mg/dL — AB (ref 70–99)
GLUCOSE: 133 mg/dL — AB (ref 70–99)
GLUCOSE: 81 mg/dL (ref 70–99)
POTASSIUM: 4.9 meq/L (ref 3.7–5.3)
POTASSIUM: 5.2 meq/L (ref 3.7–5.3)
POTASSIUM: 5.5 meq/L — AB (ref 3.7–5.3)
SODIUM: 139 meq/L (ref 137–147)
Sodium: 137 mEq/L (ref 137–147)
Sodium: 140 mEq/L (ref 137–147)

## 2014-03-04 MED ORDER — SODIUM POLYSTYRENE SULFONATE 15 GM/60ML PO SUSP
30.0000 g | Freq: Once | ORAL | Status: AC
  Administered 2014-03-04: 30 g via ORAL
  Filled 2014-03-04: qty 120

## 2014-03-04 MED ORDER — DEXTROSE 50 % IV SOLN
INTRAVENOUS | Status: DC | PRN
  Administered 2014-03-03: 50 mL via INTRAVENOUS

## 2014-03-04 MED ORDER — INSULIN ASPART 100 UNIT/ML ~~LOC~~ SOLN
SUBCUTANEOUS | Status: DC | PRN
  Administered 2014-03-03: 10 [IU] via INTRAVENOUS

## 2014-03-04 NOTE — Addendum Note (Signed)
Addendum created 03/04/14 0827 by Terrill Mohr, CRNA   Modules edited: Anesthesia Medication Administration

## 2014-03-04 NOTE — Progress Notes (Signed)
Utilization review completed. Roxie Gueye, RN, BSN. 

## 2014-03-04 NOTE — Progress Notes (Signed)
[facility] PROGRESS NOTE Interim History: 68 y.o. female with prior h/o chronic diastolic heart failure, hypertension, chronic left leg wound, diabetes Mellitus, GERD, has h/o PE, not on anticoagulation secondary to bleeding from the left leg wound, s/o IVC filter placement, chronic anemia, morbid obesity came in for debridement of the left leg wound by Dr Migdalia Dk and she underwent wound VAC placed today. Post operatively she was found to have have hyperkalemia    Assessment/Plan: Hyperkalemia - multifactorial due to bactrim, CKD stage III  and necrotic tissue. - give 2 dose of kayexalate plus membrane stabilizing treatment. - With some improvement, repeat kayexelate. - avoid nsaid's.  Chronic kidney disease, stage 3 - Cr at baseline. -   Ulcer of lower limb, unspecified: - s/p I&D on7.8.2015, wound vac in place.  Chronic diastolic heart failure - Compensated cont Beta blockers and imdur. - Not on diureticvs at this time.  Diabetes mellitus type 2 with neurological manifestations - good controlled. - cont treatment  Obesity hypoventilation syndrome - c-pap at night.  HYPERTENSION - stable cont metoprolol and imdur.    Code Status: full Family Communication: none  Disposition Plan: inpatient   Consultants:  Dr. Migdalia Dk  Procedures:  I&D on 7.8.2015  Antibiotics:  none  HPI/Subjective: No complains, want to get up  Objective: Filed Vitals:   03/03/14 1900 03/03/14 2231 03/04/14 0105 03/04/14 0603  BP: 146/58 138/52 131/44 135/50  Pulse: 72 72 76 78  Temp: 99.7 F (37.6 C) 99 F (37.2 C) 98.5 F (36.9 C) 99.1 F (37.3 C)  TempSrc: Oral Oral Oral Oral  Resp: 18 18 18 20   Height: 5\' 4"  (1.626 m)     Weight: 150.05 kg (330 lb 12.8 oz)   149.143 kg (328 lb 12.8 oz)  SpO2: 95% 97% 94% 100%    Intake/Output Summary (Last 24 hours) at 03/04/14 0715 Last data filed at 03/04/14 9371  Gross per 24 hour  Intake    980 ml  Output   3145 ml  Net   -2165 ml   Filed Weights   03/03/14 0901 03/03/14 1900 03/04/14 0603  Weight: 148.78 kg (328 lb) 150.05 kg (330 lb 12.8 oz) 149.143 kg (328 lb 12.8 oz)    Exam:  General: Alert, awake, oriented x3, in no acute distress.  HEENT: No bruits, no goiter.  Heart: Regular rate and rhythm, Lungs: Good air movement, clear Abdomen: Soft, nontender, nondistended, positive bowel sounds.     Data Reviewed: Basic Metabolic Panel:  Recent Labs Lab 02/26/14 0520 03/03/14 0845 03/03/14 1022 03/03/14 1122 03/03/14 1500 03/03/14 1655 03/04/14 0011  NA 136* 136* 136* 137 132* 135* 137  K 5.7* 6.5* 6.2* 5.6* 6.6* 6.6* 5.5*  CL 107 105  --   --  101 104 104  CO2 19 20  --   --  18* 19 20  GLUCOSE 106* 86 88 114* 111* 118* 81  BUN 31* 23  --   --  22 22 20   CREATININE 1.38* 1.44*  --   --  1.46* 1.41* 1.38*  CALCIUM 7.9* 8.3*  --   --  8.3* 8.1* 8.5   Liver Function Tests: No results found for this basename: AST, ALT, ALKPHOS, BILITOT, PROT, ALBUMIN,  in the last 168 hours No results found for this basename: LIPASE, AMYLASE,  in the last 168 hours No results found for this basename: AMMONIA,  in the last 168 hours CBC:  Recent Labs Lab 02/26/14 0520 03/03/14 0845 03/03/14 1022 03/03/14  1122 03/04/14 0011  WBC 9.0 8.1  --   --  6.8  HGB 8.1* 8.8* 8.8* 8.5* 8.8*  HCT 24.6* 27.2* 26.0* 25.0* 27.4*  MCV 90.4 90.4  --   --  92.3  PLT 280 364  --   --  326   Cardiac Enzymes: No results found for this basename: CKTOTAL, CKMB, CKMBINDEX, TROPONINI,  in the last 168 hours BNP (last 3 results) No results found for this basename: PROBNP,  in the last 8760 hours CBG:  Recent Labs Lab 02/25/14 1557 02/25/14 2129 02/26/14 1145 03/03/14 0854 03/03/14 1120  GLUCAP 155* 133* 145* 88 123*    Recent Results (from the past 240 hour(s))  SURGICAL PCR SCREEN     Status: None   Collection Time    02/24/14  8:40 AM      Result Value Ref Range Status   MRSA, PCR NEGATIVE  NEGATIVE  Final   Staphylococcus aureus NEGATIVE  NEGATIVE Final   Comment:            The Xpert SA Assay (FDA     approved for NASAL specimens     in patients over 67 years of age),     is one component of     a comprehensive surveillance     program.  Test performance has     been validated by [employer] for patients greater     than or equal to 46 year old.     It is not intended     to diagnose infection nor to     guide or monitor treatment.     Performed at [facility]  MRSA PCR SCREENING     Status: None   Collection Time    03/03/14  7:49 PM      Result Value Ref Range Status   MRSA by PCR NEGATIVE  NEGATIVE Final   Comment:            The GeneXpert MRSA Assay (FDA     approved for NASAL specimens     only), is one component of a     comprehensive MRSA colonization     surveillance program. It is not     intended to diagnose MRSA     infection nor to guide or     monitor treatment for     MRSA infections.     Studies: Dg Chest 2 View  03/03/2014   CLINICAL DATA:  Preoperative evaluation for wound debridement  EXAM: CHEST  2 VIEW  COMPARISON:  11/17/2012  FINDINGS: Enlargement of cardiac silhouette with pulmonary vascular congestion.  Accentuation of perihilar markings unchanged.  Minimal LEFT basilar subsegmental atelectasis.  No definite acute infiltrate, pleural effusion or pneumothorax.  Diffuse idiopathic skeletal hyperostosis.  IMPRESSION: Enlargement of cardiac silhouette with pulmonary vascular congestion.  Minimal LEFT basilar atelectasis.   Electronically Signed   By: Lavonia Dana M.D.   On: 03/03/2014 09:29    Scheduled Meds: . allopurinol  100 mg Oral Daily  . atorvastatin  40 mg Oral Daily  . gabapentin  300 mg Oral TID  . heparin  5,000 Units Subcutaneous 3 times per day  . isosorbide mononitrate  10 mg Oral BID  . metoprolol tartrate  37.5 mg Oral BID  . pantoprazole  40 mg Oral Daily  . polyethylene glycol  17 g Oral Daily   Continuous  Infusions:    Charlynne Cousins  [facility] Pager  [phone]. If 8PM-8AM, please contact night-coverage at [email or web address], password [facility] 03/04/2014, 7:15 AM  LOS: 1 day   **Disclaimer: This note may have been dictated with voice recognition software. Similar sounding words can inadvertently be transcribed and this note may contain transcription errors which may not have been corrected upon publication of note.**

## 2014-03-04 NOTE — Addendum Note (Signed)
Addendum created 03/04/14 1561 by Terrill Mohr, CRNA   Modules edited: Anesthesia Events

## 2014-03-04 NOTE — Progress Notes (Signed)
Chaplain delivered AD packet to pt per pt request. Reviewed HCPOA and Living Will documents with pt. She will complete it with family and then call us when ready.

## 2014-03-04 NOTE — Progress Notes (Signed)
HISTORY & PHYSICAL  DATE: 02/19/2014   FACILITY: [facility]  LEVEL OF CARE: SNF (31)  ALLERGIES:  Allergies  Allergen Reactions  . Bactrim [Sulfamethoxazole-Tmp Ds]     Hyperkalemia (July 2015)  . Penicillins Hives    CHIEF COMPLAINT:  Manage DM, CHF & HTN  HISTORY OF PRESENT ILLNESS: pt is a 68 Kelly [name]/o AA female admitted to this facility for short term rehabilitation.  DM:pt's DM remains stable.  Pt denies polyuria, polydipsia, polyphagia, changes in vision or hypoglycemic episodes.  Last hemoglobin A1c is:   Not available.    CHF:The patient does not relate significant weight changes, denies sob, DOE, orthopnea, PNDs, palpitations or chest pain.  Complains of  chronic lower extremity swelling.  CHF remains stable.  No complications form the medications being used.    HTN: Pt 's HTN remains stable.  Denies CP, sob, DOE, headaches, dizziness or visual disturbances.  No complications from the medications currently being used.  Last BP :   100/76.    PAST MEDICAL HISTORY :  Past Medical History  Diagnosis Date  . Hypertension   . Hyperlipidemia   . CHF (congestive heart failure)   . GERD (gastroesophageal reflux disease)   . Depression   . Sleep apnea     doesn't use cpap machine or 02 at night  . Headache(784.0)     Migraines  . Arthritis     knees  . Anemia     Had blood transfusion x 2 in yhe 90s  . Lymphedema of leg   . Pulmonary embolism 02/18/14    diagnosed at [facility] with [facility] angio chest  . Chronic indwelling Foley catheter   . Diabetes mellitus     not on medications    PAST SURGICAL HISTORY: Past Surgical History  Procedure Laterality Date  . Rotator cuff repair      right  . Knee arthroscopy      right  . Temple biopsy    . Colonoscopy with propofol  09/23/2012    Procedure: COLONOSCOPY WITH PROPOFOL;  Surgeon: Jerene Bears, MD;  Location: [facility];  Service: Gastroenterology;  Laterality: N/A;  . Paniculectomy with wound  debridement Left 02/08/2014    left medial thigh  . Irrigation and debridement abscess Left 02/24/2014    Procedure: MINOR INCISION AND DRAINAGE OF ABSCESS;  Surgeon: Theodoro Kos, DO;  Location: [facility];  Service: Plastics;  Laterality: Left;    SOCIAL HISTORY:  reports that she quit smoking about 32 years ago. Her smoking use included Cigarettes. She smoked 0.00 packs per day. She has never used smokeless tobacco. She reports that she does not drink alcohol or use illicit drugs.  FAMILY HISTORY:  Family History  Problem Relation Age of Onset  . Emphysema Father   . Asthma Brother   . Heart disease Mother   . Stomach cancer Brother   . Heart disease Maternal Grandmother   . Diabetes Mother   . Diabetes Sister     CURRENT MEDICATIONS: Reviewed per MAR/see medication list  REVIEW OF SYSTEMS:  See HPI otherwise 14 point ROS is negative.  PHYSICAL EXAMINATION  VS:  See VS section  GENERAL: no acute distress, morbidly obese body habitus EYES: conjunctivae normal, sclerae normal, normal eye lids MOUTH/THROAT: lips without lesions,no lesions in the mouth,tongue is without lesions,uvula elevates in midline NECK: supple, trachea midline, no neck masses, no thyroid tenderness, no thyromegaly LYMPHATICS: no LAN in the  neck, no supraclavicular LAN RESPIRATORY: breathing is even & unlabored, BS CTAB CARDIAC: RRR, no murmur,no extra heart sounds, +3 BLE edema GI:  ABDOMEN: abdomen soft, normal BS, no masses, no tenderness  LIVER/SPLEEN: no hepatomegaly, no splenomegaly MUSCULOSKELETAL: HEAD: normal to inspection  EXTREMITIES: LEFT UPPER EXTREMITY: full range of motion, normal strength & tone RIGHT UPPER EXTREMITY:  full range of motion, normal strength & tone LEFT LOWER EXTREMITY:  full range of motion, decreased strength & tone RIGHT LOWER EXTREMITY:  minimal range of motion, decreased strength & tone PSYCHIATRIC: the patient is alert & oriented to person, affect & behavior  appropriate  LABS/RADIOLOGY:  Labs reviewed: Basic Metabolic Panel:  Recent Labs  05/02/13 0445  03/03/14 1655 03/04/14 0011 03/04/14 0910  NA 139  < > 135* 137 140  K 3.7  < > 6.6* 5.5* 5.2  CL 104  < > 104 104 103  CO2 29  < > 19 20 21   GLUCOSE 121*  < > 118* 81 104*  BUN 75*  < > 22 20 18   CREATININE 2.65*  < > 1.41* 1.38* 1.34*  CALCIUM 8.4  < > 8.1* 8.5 8.3*  MG 1.7  --   --   --   --   PHOS 4.4  --   --   --   --   < > = values in this interval not displayed. Liver Function Tests:  Recent Labs  05/01/13 1900 02/20/14 2335  AST 57* 23  ALT 60* 25  ALKPHOS 158* 153*  BILITOT 0.4 0.2*  PROT 7.1 7.0  ALBUMIN 2.6* 1.9*    Recent Labs  05/01/13 1900  LIPASE 13   CBC:  Recent Labs  05/01/13 1900  02/20/14 2335  02/26/14 0520 03/03/14 0845 03/03/14 1022 03/03/14 1122 03/04/14 0011  WBC 5.4  < > 13.3*  < > 9.0 8.1  --   --  6.8  NEUTROABS 3.6  --  12.2*  --   --   --   --   --   --   HGB 10.1*  < > 9.0*  < > 8.1* 8.8* 8.8* 8.5* 8.8*  HCT 31.1*  < > 28.0*  < > 24.6* 27.2* 26.0* 25.0* 27.4*  MCV 85.9  < > 89.7  < > 90.4 90.4  --   --  92.3  PLT 181  < > 370  < > 280 364  --   --  326  < > = values in this interval not displayed.  CBG:  Recent Labs  02/26/14 1145 03/03/14 0854 03/03/14 1120  GLUCAP 145* 88 123*   On 02/18/2014:    PT 12.3, INR 1.2.    CO2 20, BUN 25, glucose 142, otherwise BMP normal.    Magnesium 1.8.    On 02/17/2014:    WBC 11, hemoglobin 8, MCV 92.6, platelets 275.     ASSESSMENT/PLAN:   Diabetes mellitus.  Currently diet controlled.    CHF.  Compensated.    Hypertension.  Well controlled.     Peripheral neuropathy.  Continue Neurontin.     Lymphedema.  Continue supportive care.     Hyperlipidemia.  Continue Lipitor.    Constipation.  Continue MiraLAX.    Check CBC and BMP.    I have reviewed patient's medical records received at admission/from hospitalization.  CPT CODE: 15400  Kelly [name] Kelly [name]  Kelly [name], Kelly [name] [phone]

## 2014-03-05 ENCOUNTER — Encounter (HOSPITAL_COMMUNITY): Payer: Self-pay | Admitting: Plastic Surgery

## 2014-03-05 DIAGNOSIS — N179 Acute kidney failure, unspecified: Secondary | ICD-10-CM

## 2014-03-05 DIAGNOSIS — D72829 Elevated white blood cell count, unspecified: Secondary | ICD-10-CM

## 2014-03-05 DIAGNOSIS — N189 Chronic kidney disease, unspecified: Secondary | ICD-10-CM

## 2014-03-05 LAB — BASIC METABOLIC PANEL
Anion gap: 14 (ref 5–15)
BUN: 16 mg/dL (ref 6–23)
CO2: 22 meq/L (ref 19–32)
Calcium: 7.7 mg/dL — ABNORMAL LOW (ref 8.4–10.5)
Chloride: 103 mEq/L (ref 96–112)
Creatinine, Ser: 1.21 mg/dL — ABNORMAL HIGH (ref 0.50–1.10)
GFR calc Af Amer: 53 mL/min — ABNORMAL LOW (ref >90)
GFR, EST NON AFRICAN AMERICAN: 46 mL/min — AB (ref >90)
Glucose, Bld: 114 mg/dL — ABNORMAL HIGH (ref 70–99)
Potassium: 4.5 mEq/L (ref 3.7–5.3)
SODIUM: 139 meq/L (ref 137–147)

## 2014-03-05 MED ORDER — DOXYCYCLINE HYCLATE 100 MG PO TABS: 100.0000 mg | ORAL_TABLET | Freq: Two times a day (BID) | ORAL | Status: AC

## 2014-03-05 MED ORDER — DOXYCYCLINE HYCLATE 100 MG PO TABS
100.0000 mg | ORAL_TABLET | Freq: Two times a day (BID) | ORAL | Status: DC
  Administered 2014-03-05: 100 mg via ORAL
  Filled 2014-03-05 (×2): qty 1

## 2014-03-05 MED ORDER — CIPROFLOXACIN HCL 500 MG PO TABS
500.0000 mg | ORAL_TABLET | Freq: Two times a day (BID) | ORAL | Status: AC
Start: 2014-03-05 — End: 2014-03-15

## 2014-03-05 MED ORDER — CIPROFLOXACIN HCL 500 MG PO TABS
500.0000 mg | ORAL_TABLET | Freq: Two times a day (BID) | ORAL | Status: DC
  Administered 2014-03-05: 500 mg via ORAL
  Filled 2014-03-05 (×3): qty 1

## 2014-03-05 NOTE — Progress Notes (Addendum)
1330 Transferred in from 2 H via wheelchair Fully awake , alert and oriented > NO apparent distress. Kept comfortable in bed . Safety precaution observed Needs attendted

## 2014-03-05 NOTE — Progress Notes (Signed)
1148 report received from [facility] fro 2h

## 2014-03-05 NOTE — Progress Notes (Signed)
1145 refused to have foley cath as ordered because  of fear of messing her wound . Emphasis the possibility of occurrence of CAUITI with long time use of foley cath . And  Wound care and pericare to prevent wound infectiom. Made MD aware and social aware before trnsfer to Rehab center

## 2014-03-05 NOTE — Progress Notes (Signed)
27 social worker , pt will arrange with nursing rehab about foley cath to maintain

## 2014-03-05 NOTE — Progress Notes (Signed)
1618 to [place] [facility] via [employer] . Pt fully awake alert and oriented . Denied pain . Pt foley in . Social worker confirmed with faclity agreeable  able to keep foley in,Vac to wound to  Thigh clamped

## 2014-03-05 NOTE — Discharge Summary (Signed)
Physician Discharge Summary  Kelly [name] FYB:[id number] DOB: 1946/04/13 DOA: 03/03/2014  PCP: Alvester Chou, NP  Admit date: 03/03/2014 Discharge date: 03/05/2014  Time spent: 35 minutes  Recommendations for Outpatient Follow-up:  1. Follow up with Wound care clinic in 1 week.  2. Avoid bactrim.  Discharge Diagnoses:  Active Problems:   HYPERTENSION   Diabetes mellitus type 2 with neurological manifestations   Obesity hypoventilation syndrome   Chronic diastolic heart failure   Ulcer of lower limb, unspecified   Chronic kidney disease, stage 3   Hyperkalemia   Discharge Condition: stable  Diet recommendation: carb modidifed  Filed Weights   03/03/14 1900 03/04/14 0603 03/05/14 0603  Weight: 150.05 kg (330 lb 12.8 oz) 149.143 kg (328 lb 12.8 oz) 146.693 kg (323 lb 6.4 oz)    History of present illness:  68 year old female with past medical history of chronic diastolic heart failure hypertension and chronic leg wound diabetes with a history of PE not on anticoagulation due to to bleeding status post IVC filter, who had treatment on the day of admission and wound VAC placed postoperatively he was found to be hyperkalemic so was referred to [facility].  Hospital Course:  Hyperkalemia  - multifactorial due to bactrim, CKD stage III and necrotic tissue.  - give 2 dose of kayexalate plus membrane stabilizing treatment.  - With  improvement,  - Repeat potassium remained stable. Patient was changed to Cipro and doxycycline for antibiotic coverage.  Chronic kidney disease, stage 3  - Cr at baseline.    Ulcer of lower limb, unspecified:  - s/p I&D on7.8.2015, wound vac in place.  - Start her on Cipro and Flagyl for 10 days.  Chronic diastolic heart failure  - Compensated cont Beta blockers and imdur.  - Not on diureticvs at this time.   Diabetes mellitus type 2 with neurological manifestations  - good controlled.  - cont CBG's in am and pm.  Obesity hypoventilation  syndrome  - c-pap at night.   HYPERTENSION  - stable cont metoprolol and imdur.   Procedures:  None  Consultations:  none  Discharge Exam: Filed Vitals:   03/05/14 0603  BP: 145/47  Pulse: 70  Temp: 98.5 F (36.9 C)  Resp: 18    General: A&O x3 Cardiovascular: RRR Respiratory: good air movement CTA B/L  Discharge Instructions You were cared for by a hospitalist during your hospital stay. If you have any questions about your discharge medications or the care you received while you were in the hospital after you are discharged, you can call the unit and asked to speak with the hospitalist on call if the hospitalist that took care of you is not available. Once you are discharged, your primary care physician will handle any further medical issues. Please note that NO REFILLS for any discharge medications will be authorized once you are discharged, as it is imperative that you return to your primary care physician (or establish a relationship with a primary care physician if you do not have one) for your aftercare needs so that they can reassess your need for medications and monitor your lab values.      Discharge Instructions   Diet - low sodium heart healthy    Complete by:  As directed      Increase activity slowly    Complete by:  As directed             Medication List         allopurinol  100 MG tablet  Commonly known as:  ZYLOPRIM  Take 100 mg by mouth daily.     aspirin 325 MG EC tablet  Take 325 mg by mouth daily.     atorvastatin 40 MG tablet  Commonly known as:  LIPITOR  Take 40 mg by mouth daily.     chlorthalidone 25 MG tablet  Commonly known as:  HYGROTON  Take 12.5 mg by mouth daily.     ciprofloxacin 500 MG tablet  Commonly known as:  CIPRO  Take 1 tablet (500 mg total) by mouth 2 (two) times daily.     doxycycline 100 MG tablet  Commonly known as:  VIBRA-TABS  Take 1 tablet (100 mg total) by mouth every 12 (twelve) hours.     esomeprazole  40 MG capsule  Commonly known as:  NEXIUM  Take 40 mg by mouth daily before breakfast.     gabapentin 300 MG capsule  Commonly known as:  NEURONTIN  Take 300 mg by mouth 3 (three) times daily.     isosorbide mononitrate 10 MG tablet  Commonly known as:  ISMO,MONOKET  Take 10 mg by mouth 2 (two) times daily.     magnesium oxide 400 MG tablet  Commonly known as:  MAG-OX  Take 400 mg by mouth daily.     metoprolol tartrate 25 MG tablet  Commonly known as:  LOPRESSOR  Take 37.5 mg by mouth 2 (two) times daily.     oxyCODONE 5 MG immediate release tablet  Commonly known as:  Oxy IR/ROXICODONE  Take 1 tablet (5 mg total) by mouth every 4 (four) hours as needed for severe pain (for pain).     polyethylene glycol packet  Commonly known as:  MIRALAX / GLYCOLAX  Take 17 g by mouth daily.     promethazine 25 MG tablet  Commonly known as:  PHENERGAN  Take 25 mg by mouth 2 (two) times daily as needed for nausea or vomiting.     sulfamethoxazole-trimethoprim 800-160 MG per tablet  Commonly known as:  BACTRIM DS  Take 1 tablet by mouth every 12 (twelve) hours. For 5 days     zolpidem 6.25 MG CR tablet  Commonly known as:  AMBIEN CR  Take 1 tablet (6.25 mg total) by mouth at bedtime as needed for sleep.       Allergies  Allergen Reactions  . Bactrim [Sulfamethoxazole-Tmp Ds]     Hyperkalemia (July 2015)  . Penicillins Hives   Follow-up Information   Follow up with [facility], BRINDA, MD In 1 week. (will need VAC change in the OR in one week)    Specialty:  Plastic Surgery   Contact information:   [address] [address] [phone]        The results of significant diagnostics from this hospitalization (including imaging, microbiology, ancillary and laboratory) are listed below for reference.    Significant Diagnostic Studies: Dg Chest 2 View  03/03/2014   CLINICAL DATA:  Preoperative evaluation for wound debridement  EXAM: CHEST  2 VIEW   COMPARISON:  11/17/2012  FINDINGS: Enlargement of cardiac silhouette with pulmonary vascular congestion.  Accentuation of perihilar markings unchanged.  Minimal LEFT basilar subsegmental atelectasis.  No definite acute infiltrate, pleural effusion or pneumothorax.  Diffuse idiopathic skeletal hyperostosis.  IMPRESSION: Enlargement of cardiac silhouette with pulmonary vascular congestion.  Minimal LEFT basilar atelectasis.   Electronically Signed   By: Lavonia Dana M.D.   On: 03/03/2014 09:29   Ir  Ivc Filter Plmt / S&i /img Guid/mod Sed  02/23/2014   CLINICAL DATA:  History of pulmonary embolism. Anticoagulation cannot be continued due to bleeding from the left lower extremity. Request is been made to place an IVC filter.  EXAM: 1. ULTRASOUND GUIDANCE FOR VASCULAR ACCESS OF THE RIGHT INTERNAL JUGULAR VEIN. 2. IVC VENOGRAM. 3. PERCUTANEOUS IVC FILTER PLACEMENT.  ANESTHESIA/SEDATION: 2.0 mg IV Versed; 100 mcg IV Fentanyl.  Total Moderate Sedation Time  71minutes.  CONTRAST:  CO2  FLUOROSCOPY TIME:  48 seconds.  PROCEDURE: The procedure, risks, benefits, and alternatives were explained to the patient. Questions regarding the procedure were encouraged and answered. The patient understands and consents to the procedure.  The right neck was prepped with Betadine in a sterile fashion, and a sterile drape was applied covering the operative field. A sterile gown and sterile gloves were used for the procedure. Local anesthesia was provided with 1% Lidocaine.  Ultrasound was used to confirm patency of the right internal jugular vein. Under direct ultrasound guidance, a 21 gauge needle was advanced into the right internal jugular vein with ultrasound image documentation performed. After securing access with a micropuncture dilator, a guidewire was advanced into the inferior vena cava. A deployment sheath was advanced over the guidewire. This was utilized to perform IVC venography.  The deployment sheath was further positioned  in an appropriate location for filter deployment. A Bard Denali IVC filter was then advanced in the sheath. This was then fully deployed in the infrarenal IVC. Final filter position was confirmed with a fluoroscopic spot image. Contrast injection was also performed through the sheath under fluoroscopy to confirm patency of the IVC at the level of the filter. After the procedure the sheath was removed and hemostasis obtained with manual compression.  COMPLICATIONS: None.  FINDINGS: IVC venography demonstrates a normal caliber IVC with no evidence of thrombus. Renal veins are identified bilaterally. The IVC filter was successfully positioned below the level of the renal veins and is appropriately oriented. This IVC filter has both permanent and retrievable indications.  IMPRESSION: Placement of percutaneous IVC filter in infrarenal IVC. IVC venogram shows no evidence of IVC thrombus and normal caliber of the inferior vena cava. This filter does have both permanent and retrievable indications.   Electronically Signed   By: Aletta Edouard M.D.   On: 02/23/2014 17:19   Dg Chest Port 1 View  02/20/2014   CLINICAL DATA:  Fever.  Weakness.  EXAM: PORTABLE CHEST - 1 VIEW  COMPARISON:  11/17/2012  FINDINGS: No cardiomegaly for technique. Unchanged prominence of the pulmonary vasculature. No edema, consolidation, effusion, or pneumothorax. No acute osseous findings. Degenerative thoracic endplate spurring.  IMPRESSION: No definitive pneumonia.   Electronically Signed   By: Jorje Guild M.D.   On: 02/20/2014 23:51    Microbiology: Recent Results (from the past 240 hour(s))  SURGICAL PCR SCREEN     Status: None   Collection Time    02/24/14  8:40 AM      Result Value Ref Range Status   MRSA, PCR NEGATIVE  NEGATIVE Final   Staphylococcus aureus NEGATIVE  NEGATIVE Final   Comment:            The Xpert SA Assay (FDA     approved for NASAL specimens     in patients over 38 years of age),     is one component of      a comprehensive surveillance     program.  Test performance has  been validated by [facility] for patients greater     than or equal to 20 year old.     It is not intended     to diagnose infection nor to     guide or monitor treatment.     Performed at [facility]  MRSA PCR SCREENING     Status: None   Collection Time    03/03/14  7:49 PM      Result Value Ref Range Status   MRSA by PCR NEGATIVE  NEGATIVE Final   Comment:            The GeneXpert MRSA Assay (FDA     approved for NASAL specimens     only), is one component of a     comprehensive MRSA colonization     surveillance program. It is not     intended to diagnose MRSA     infection nor to guide or     monitor treatment for     MRSA infections.     Labs: Basic Metabolic Panel:  Recent Labs Lab 03/03/14 1500 03/03/14 1655 03/04/14 0011 03/04/14 0910 03/04/14 2151  NA 132* 135* 137 140 139  K 6.6* 6.6* 5.5* 5.2 4.9  CL 101 104 104 103 104  CO2 18* 19 20 21 22   GLUCOSE 111* 118* 81 104* 133*  BUN 22 22 20 18 17   CREATININE 1.46* 1.41* 1.38* 1.34* 1.37*  CALCIUM 8.3* 8.1* 8.5 8.3* 7.8*   Liver Function Tests: No results found for this basename: AST, ALT, ALKPHOS, BILITOT, PROT, ALBUMIN,  in the last 168 hours No results found for this basename: LIPASE, AMYLASE,  in the last 168 hours No results found for this basename: AMMONIA,  in the last 168 hours CBC:  Recent Labs Lab 03/03/14 0845 03/03/14 1022 03/03/14 1122 03/04/14 0011  WBC 8.1  --   --  6.8  HGB 8.8* 8.8* 8.5* 8.8*  HCT 27.2* 26.0* 25.0* 27.4*  MCV 90.4  --   --  92.3  PLT 364  --   --  326   Cardiac Enzymes: No results found for this basename: CKTOTAL, CKMB, CKMBINDEX, TROPONINI,  in the last 168 hours BNP: BNP (last 3 results) No results found for this basename: PROBNP,  in the last 8760 hours CBG:  Recent Labs Lab 02/26/14 1145 03/03/14 0854 03/03/14 1120 03/04/14 2213  GLUCAP 145* 88 123* 134*        Signed:  FELIZ ORTIZ, Kamrynn Melott  [facility] 03/05/2014, 7:59 AM

## 2014-03-07 NOTE — Progress Notes (Signed)
Clinical Social Worker facilitated patient discharge including contacting patient family and facility to confirm patient discharge plans.  Clinical information faxed to facility and patient/family agreeable with plan. CSW arranged ambulance transport via [facility] to [facility] . RN to call report prior to discharge. CSW spoke extensively with patient earlier today along with Corliss Blacker, RN- [facility].  Patient verbalized concerns about her care at the facility and stated that she had tried, prior to admission to the hospital, to speak with the facility Director of Nursing. Mr. Koleen Nimrod arranged for patient to speak with the D.O.N via telephone and after this conversation- patient related that she felt agreeable to return to facility on a trial basis.  She requested that CSW still send out a SNF search so that possible bed offers would be in place should this re-admission not work out. Patient was pleased with this arrangement and requested that her niece- Jonelle Sidle and nephew Juanda Crumble be notified of above. CSW completed this task; both were pleased with this d/c plan.  Clinical Social Worker will sign off for now as social work intervention is no longer needed. Please consult [place] again if new need arises.  Kendell Bane, [place]

## 2014-03-07 NOTE — Clinical Social Work Psychosocial (Addendum)
    Clinical Social Work Department BRIEF PSYCHOSOCIAL ASSESSMENT 03/04/2014  Patient:  Kelly [name], Kelly [name]     Account Number:  [id number]     Admit date:  03/03/2014  Clinical Social Worker:  Iona Coach  Date/Time:  03/04/2014 01:35 PM  Referred by:  Physician  Date Referred:  03/04/2014 Referred for  Other - See comment   Other Referral:   Return to SNF?  (Pt's neice requesting change in faclity)   Interview type:  Patient Other interview type:    PSYCHOSOCIAL DATA Living Status:  FACILITY Admitted from facility:  [facility] Level of care:  [place] Primary support name:  Suzzanne Cloud  [phone]  [facility]) Primary support relationship to patient:  FAMILY Degree of support available:   Strong support  other:  Tamera Gallway- Neice    CURRENT CONCERNS Current Concerns  Other - See comment   Other Concerns:   From MG- wants to consider change in facility    SOCIAL WORK ASSESSMENT / PLAN 68 year old female- resident of [place]. Patient states that she has been unhappy with the care she is currently receiving at [facility] and wants to consider a change in facility  She requires a wound vac for care at this time and states she had a VAC at the facility.  She staes that her family will support any decision that she makes.  CSW spoke to Admissions at the facility and they stated that they would gladly accept patient back when medically stable.  Fl2 placed on chart for MD"s signature. Patient wants to "think" about possible move. SW will follow up in the a.m.   Assessment/plan status:  Psychosocial Support/Ongoing Assessment of Needs Other assessment/ plan:   Information/referral to community resources:   CSW discussed other facilities for consideration. Patient stated that she did not really know any other facilities specifically but had heard about [facility] and [facility] CSW provided her with a SNF list to  consider.    PATIENT'S/FAMILY'S RESPONSE TO PLAN OF CARE: Patient is alert, oriented and very pleasant. She stated that she makes her own medical decisions but relies on her neices and nephews to help her.  She wants to think about possibility of change in facility and will discuss with CSW tomorrow.

## 2014-03-08 ENCOUNTER — Other Ambulatory Visit: Payer: Self-pay | Admitting: *Deleted

## 2014-03-08 ENCOUNTER — Non-Acute Institutional Stay (SKILLED_NURSING_FACILITY): Payer: Medicare Other | Admitting: Internal Medicine

## 2014-03-08 DIAGNOSIS — E1122 Type 2 diabetes mellitus with diabetic chronic kidney disease: Secondary | ICD-10-CM | POA: Insufficient documentation

## 2014-03-08 DIAGNOSIS — I5032 Chronic diastolic (congestive) heart failure: Secondary | ICD-10-CM

## 2014-03-08 DIAGNOSIS — E875 Hyperkalemia: Secondary | ICD-10-CM

## 2014-03-08 DIAGNOSIS — N183 Chronic kidney disease, stage 3 unspecified: Secondary | ICD-10-CM

## 2014-03-08 DIAGNOSIS — E1129 Type 2 diabetes mellitus with other diabetic kidney complication: Secondary | ICD-10-CM

## 2014-03-08 MED ORDER — ZOLPIDEM TARTRATE ER 6.25 MG PO TBCR: EXTENDED_RELEASE_TABLET | ORAL | Status: DC

## 2014-03-08 MED ORDER — OXYCODONE HCL 5 MG PO TABS: 5.0000 mg | ORAL_TABLET | ORAL | Status: DC | PRN

## 2014-03-08 NOTE — Progress Notes (Signed)
HISTORY & PHYSICAL  DATE: 03/08/2014   FACILITY: [facility]  LEVEL OF CARE: SNF (31)  ALLERGIES:  Allergies  Allergen Reactions  . Bactrim [Sulfamethoxazole-Tmp Ds]     Hyperkalemia (July 2015)  . Penicillins Hives    CHIEF COMPLAINT:  Manage diabetes mellitus, chronic kidney disease stage III and CHF  HISTORY OF PRESENT ILLNESS: [name] was hospitalized secondary to hyperkalemia. After hospitalization she was readmitted back to the facility for short-term rehabilitation and wound care.  CHF:The patient does not relate significant weight changes, denies sob, DOE, orthopnea, PNDs, palpitations or chest pain.  CHF remains stable.  No complications form the medications being used. Complains of chronic low extremity swelling.  DM:pt's DM remains stable.  Pt denies polyuria, polydipsia, polyphagia, changes in vision or hypoglycemic episodes.  No complications noted from the medication presently being used.  Last hemoglobin A1c is: Not available.  CHRONIC KIDNEY DISEASE: The patient's chronic kidney disease remains stable.  Patient denies increasing lower extremity swelling or confusion. Last BUN and creatinine are: 16/2.1.  PAST MEDICAL HISTORY :  Past Medical History  Diagnosis Date  . Hypertension   . Hyperlipidemia   . CHF (congestive heart failure)   . GERD (gastroesophageal reflux disease)   . Depression   . Sleep apnea     doesn't use cpap machine or 02 at night  . Headache(784.0)     Migraines  . Arthritis     knees  . Anemia     Had blood transfusion x 2 in yhe 90s  . Lymphedema of leg   . Pulmonary embolism 02/18/14    diagnosed at [facility] with CT angio chest  . Chronic indwelling Foley catheter   . Diabetes mellitus     not on medications    PAST SURGICAL HISTORY: Past Surgical History  Procedure Laterality Date  . Rotator cuff repair      right  . Knee arthroscopy      right  . Temple biopsy    .  Colonoscopy with propofol  09/23/2012    Procedure: COLONOSCOPY WITH PROPOFOL;  Surgeon: Jerene Bears, MD;  Location: [facility];  Service: Gastroenterology;  Laterality: N/A;  . Paniculectomy with wound debridement Left 02/08/2014    left medial thigh  . Irrigation and debridement abscess Left 02/24/2014    Procedure: MINOR INCISION AND DRAINAGE OF ABSCESS;  Surgeon: Theodoro Kos, DO;  Location: [facility];  Service: Plastics;  Laterality: Left;  . I&d extremity Left 03/03/2014    Procedure: IRRIGATION AND DEBRIDEMENT LEFT LEG WOUND AND PLACEMENT OF A CELL AND VAC;  Surgeon: Theodoro Kos, DO;  Location: [facility];  Service: Plastics;  Laterality: Left;    SOCIAL HISTORY:  reports that she quit smoking about 32 years ago. Her smoking use included Cigarettes. She smoked 0.00 packs per day. She has never used smokeless tobacco. She reports that she does not drink alcohol or use illicit drugs.  FAMILY HISTORY:  Family History  Problem Relation Age of Onset  . Emphysema Father   . Asthma Brother   . Heart disease Mother   . Stomach cancer Brother   . Heart disease Maternal Grandmother   . Diabetes Mother   . Diabetes Sister     CURRENT MEDICATIONS: Reviewed per MAR/see medication list  REVIEW OF SYSTEMS: GI: Complains of mid epigastric tenderness,  See HPI otherwise 14 point ROS is negative.  PHYSICAL EXAMINATION  VS:  See VS section  GENERAL: no acute distress, morbidly obese body habitus EYES: conjunctivae normal, sclerae normal, normal eye lids MOUTH/THROAT: lips without lesions,no lesions in the mouth,tongue is without lesions,uvula elevates in midline NECK: supple, trachea midline, no neck masses, no thyroid tenderness, no thyromegaly LYMPHATICS: no LAN in the neck, no supraclavicular LAN RESPIRATORY: breathing is even & unlabored, BS CTAB CARDIAC: RRR, no murmur,no extra heart sounds, +3 bilateral lower extremity edema GI:  ABDOMEN: abdomen soft, normal BS, no masses, mid epigastric  tenderness present LIVER/SPLEEN: no hepatomegaly, no splenomegaly MUSCULOSKELETAL: HEAD: normal to inspection  EXTREMITIES: LEFT UPPER EXTREMITY: full range of motion, normal strength & tone RIGHT UPPER EXTREMITY:  full range of motion, normal strength & tone LEFT LOWER EXTREMITY:  Minimal range of motion, normal strength & tone, has wound VAC on thigh RIGHT LOWER EXTREMITY:  Minimal range of motion, normal strength & tone PSYCHIATRIC: the patient is alert & oriented to person, affect & behavior appropriate  LABS/RADIOLOGY:  Labs reviewed: Basic Metabolic Panel:  Recent Labs  05/02/13 0445  03/04/14 0910 03/04/14 2151 03/05/14 1038  NA 139  < > 140 139 139  K 3.7  < > 5.2 4.9 4.5  CL 104  < > 103 104 103  CO2 29  < > 21 22 22   GLUCOSE 121*  < > 104* 133* 114*  BUN 75*  < > 18 17 16   CREATININE 2.65*  < > 1.34* 1.37* 1.21*  CALCIUM 8.4  < > 8.3* 7.8* 7.7*  MG 1.7  --   --   --   --   PHOS 4.4  --   --   --   --   < > = values in this interval not displayed. Liver Function Tests:  Recent Labs  05/01/13 1900 02/20/14 2335  AST 57* 23  ALT 60* 25  ALKPHOS 158* 153*  BILITOT 0.4 0.2*  PROT 7.1 7.0  ALBUMIN 2.6* 1.9*    Recent Labs  05/01/13 1900  LIPASE 13   CBC:  Recent Labs  05/01/13 1900  02/20/14 2335  02/26/14 0520 03/03/14 0845 03/03/14 1022 03/03/14 1122 03/04/14 0011  WBC 5.4  < > 13.3*  < > 9.0 8.1  --   --  6.8  NEUTROABS 3.6  --  12.2*  --   --   --   --   --   --   HGB 10.1*  < > 9.0*  < > 8.1* 8.8* 8.8* 8.5* 8.8*  HCT 31.1*  < > 28.0*  < > 24.6* 27.2* 26.0* 25.0* 27.4*  MCV 85.9  < > 89.7  < > 90.4 90.4  --   --  92.3  PLT 181  < > 370  < > 280 364  --   --  326  < > = values in this interval not displayed.  CBG:  Recent Labs  03/03/14 0854 03/03/14 1120 03/04/14 2213  GLUCAP 88 123* 134*    CHEST  2 VIEW   COMPARISON:  11/17/2012   FINDINGS: Enlargement of cardiac silhouette with pulmonary vascular congestion.     Accentuation of perihilar markings unchanged.   Minimal LEFT basilar subsegmental atelectasis.   No definite acute infiltrate, pleural effusion or pneumothorax.   Diffuse idiopathic skeletal hyperostosis.   IMPRESSION: Enlargement of cardiac silhouette with pulmonary vascular congestion.   Minimal LEFT basilar atelectasis.   ASSESSMENT/PLAN:  CHF-compensated Diabetes mellitus with renal complications-diet controlled Chronic kidney disease stage III-recheck Hyperkalemia-recheck Renovascular hypertension-blood pressure a  elevated. Review log in one week. Left Lower extremity wound-continue Cipro and Flagyl Check CBC and BMP  I have reviewed patient's medical records received at admission/from hospitalization.  CPT CODE: 27517  Kelly [name] Y Aamari Strawderman, [place] [phone]

## 2014-03-08 NOTE — Telephone Encounter (Signed)
[facility] 

## 2014-03-10 ENCOUNTER — Non-Acute Institutional Stay (SKILLED_NURSING_FACILITY): Payer: Medicare Other | Admitting: Internal Medicine

## 2014-03-10 DIAGNOSIS — N039 Chronic nephritic syndrome with unspecified morphologic changes: Secondary | ICD-10-CM

## 2014-03-10 DIAGNOSIS — N183 Chronic kidney disease, stage 3 unspecified: Secondary | ICD-10-CM

## 2014-03-10 DIAGNOSIS — D631 Anemia in chronic kidney disease: Secondary | ICD-10-CM

## 2014-03-10 NOTE — H&P (Signed)
  Patient of Drs. Marks and Sanger that nderwent a left leg tissue resection (panniculectomy) at [facility]. Wound was approximated and left open to heal by partial secondary intention healing. She developed hematoma after starting anticoagulation for PE. She has undergone multiple debridements and A Cell placement by Dr. Migdalia Dk for treatment of wound. Planned procedure for repeat A Cell application and VAC.  Last procedure marked by hyperkalemia.  PE  Alert Clear poor BS posteriorly Regular rate Abdomen soft obese L thigh: VAC dressing not intact, draining-- this was removed. Wound large, clean, >95% granulated   A/P Procedure cancelled due to hyperkalemia. Will be admitted for treatment and work up. Wet to dry dressings for now.  Will try to reschedule procedure for VAC, A Cell and partial closure next week.The patient has a home VAC which the facility did not transport with patient, Will need this prior to discharge.  Irene Limbo, MD [facility] [phone]

## 2014-03-11 ENCOUNTER — Encounter (HOSPITAL_COMMUNITY): Payer: Self-pay | Admitting: *Deleted

## 2014-03-11 MED ORDER — CLINDAMYCIN PHOSPHATE 600 MG/50ML IV SOLN
600.0000 mg | Freq: Once | INTRAVENOUS | Status: DC
  Filled 2014-03-11: qty 50

## 2014-03-12 ENCOUNTER — Encounter (HOSPITAL_COMMUNITY)
Admission: RE | Disposition: A | Discharge status: Skilled Nursing Facility | Payer: Medicare Other | Source: Ambulatory Visit | Attending: Family Medicine

## 2014-03-12 ENCOUNTER — Encounter (HOSPITAL_COMMUNITY): Payer: Self-pay | Admitting: *Deleted

## 2014-03-12 ENCOUNTER — Encounter (HOSPITAL_COMMUNITY): Payer: Self-pay | Admitting: Certified Registered"

## 2014-03-12 ENCOUNTER — Ambulatory Visit (HOSPITAL_COMMUNITY)
Admission: RE | Admit: 2014-03-12 | Discharge: 2014-03-12 | DRG: 641 | Disposition: A | Discharge status: Skilled Nursing Facility | Payer: Medicare Other | Source: Ambulatory Visit | Attending: Family Medicine | Admitting: Family Medicine

## 2014-03-12 DIAGNOSIS — Z7982 Long term (current) use of aspirin: Secondary | ICD-10-CM | POA: Insufficient documentation

## 2014-03-12 DIAGNOSIS — E875 Hyperkalemia: Secondary | ICD-10-CM | POA: Diagnosis not present

## 2014-03-12 DIAGNOSIS — E119 Type 2 diabetes mellitus without complications: Secondary | ICD-10-CM | POA: Insufficient documentation

## 2014-03-12 DIAGNOSIS — I10 Essential (primary) hypertension: Secondary | ICD-10-CM | POA: Insufficient documentation

## 2014-03-12 DIAGNOSIS — Z86711 Personal history of pulmonary embolism: Secondary | ICD-10-CM | POA: Insufficient documentation

## 2014-03-12 DIAGNOSIS — L97909 Non-pressure chronic ulcer of unspecified part of unspecified lower leg with unspecified severity: Secondary | ICD-10-CM | POA: Diagnosis present

## 2014-03-12 DIAGNOSIS — Z87891 Personal history of nicotine dependence: Secondary | ICD-10-CM | POA: Insufficient documentation

## 2014-03-12 DIAGNOSIS — Z5309 Procedure and treatment not carried out because of other contraindication: Secondary | ICD-10-CM | POA: Insufficient documentation

## 2014-03-12 DIAGNOSIS — Z6841 Body Mass Index (BMI) 40.0 and over, adult: Secondary | ICD-10-CM | POA: Insufficient documentation

## 2014-03-12 DIAGNOSIS — D649 Anemia, unspecified: Secondary | ICD-10-CM | POA: Insufficient documentation

## 2014-03-12 DIAGNOSIS — E785 Hyperlipidemia, unspecified: Secondary | ICD-10-CM | POA: Diagnosis not present

## 2014-03-12 DIAGNOSIS — Z79899 Other long term (current) drug therapy: Secondary | ICD-10-CM | POA: Diagnosis not present

## 2014-03-12 DIAGNOSIS — I509 Heart failure, unspecified: Secondary | ICD-10-CM | POA: Diagnosis not present

## 2014-03-12 DIAGNOSIS — E1129 Type 2 diabetes mellitus with other diabetic kidney complication: Secondary | ICD-10-CM

## 2014-03-12 DIAGNOSIS — K219 Gastro-esophageal reflux disease without esophagitis: Secondary | ICD-10-CM | POA: Insufficient documentation

## 2014-03-12 DIAGNOSIS — I5032 Chronic diastolic (congestive) heart failure: Secondary | ICD-10-CM | POA: Insufficient documentation

## 2014-03-12 LAB — CBC WITH DIFFERENTIAL/PLATELET
BASOS PCT: 1 % (ref 0–1)
Basophils Absolute: 0 10*3/uL (ref 0.0–0.1)
Eosinophils Absolute: 0.4 10*3/uL (ref 0.0–0.7)
Eosinophils Relative: 7 % — ABNORMAL HIGH (ref 0–5)
HCT: 27.6 % — ABNORMAL LOW (ref 36.0–46.0)
Hemoglobin: 8.8 g/dL — ABNORMAL LOW (ref 12.0–15.0)
LYMPHS ABS: 1.2 10*3/uL (ref 0.7–4.0)
Lymphocytes Relative: 20 % (ref 12–46)
MCH: 29.3 pg (ref 26.0–34.0)
MCHC: 31.9 g/dL (ref 30.0–36.0)
MCV: 92 fL (ref 78.0–100.0)
Monocytes Absolute: 1 10*3/uL (ref 0.1–1.0)
Monocytes Relative: 16 % — ABNORMAL HIGH (ref 3–12)
NEUTROS PCT: 58 % (ref 43–77)
Neutro Abs: 3.6 10*3/uL (ref 1.7–7.7)
PLATELETS: 278 10*3/uL (ref 150–400)
RBC: 3 MIL/uL — AB (ref 3.87–5.11)
RDW: 15.4 % (ref 11.5–15.5)
WBC: 6.2 10*3/uL (ref 4.0–10.5)

## 2014-03-12 LAB — GLUCOSE, CAPILLARY
GLUCOSE-CAPILLARY: 94 mg/dL (ref 70–99)
Glucose-Capillary: 92 mg/dL (ref 70–99)
Glucose-Capillary: 91 mg/dL (ref 70–99)

## 2014-03-12 LAB — BASIC METABOLIC PANEL
Anion gap: 13 (ref 5–15)
BUN: 23 mg/dL (ref 6–23)
CO2: 21 mEq/L (ref 19–32)
Calcium: 8 mg/dL — ABNORMAL LOW (ref 8.4–10.5)
Chloride: 105 mEq/L (ref 96–112)
Creatinine, Ser: 1.17 mg/dL — ABNORMAL HIGH (ref 0.50–1.10)
GFR calc Af Amer: 55 mL/min — ABNORMAL LOW (ref >90)
GFR calc non Af Amer: 47 mL/min — ABNORMAL LOW (ref >90)
Glucose, Bld: 98 mg/dL (ref 70–99)
Potassium: 6.2 mEq/L — ABNORMAL HIGH (ref 3.7–5.3)
Sodium: 139 mEq/L (ref 137–147)

## 2014-03-12 LAB — POCT I-STAT 4, (NA,K, GLUC, HGB,HCT)
Glucose, Bld: 126 mg/dL — ABNORMAL HIGH (ref 70–99)
HEMATOCRIT: 28 % — AB (ref 36.0–46.0)
HEMOGLOBIN: 9.5 g/dL — AB (ref 12.0–15.0)
Potassium: 4.7 mEq/L (ref 3.7–5.3)
SODIUM: 139 meq/L (ref 137–147)

## 2014-03-12 SURGERY — IRRIGATION AND DEBRIDEMENT WOUND
Anesthesia: General | Laterality: Left

## 2014-03-12 MED ORDER — LACTATED RINGERS IV SOLN
INTRAVENOUS | Status: DC
  Administered 2014-03-12: 08:00:00 via INTRAVENOUS

## 2014-03-12 MED ORDER — LIDOCAINE HCL (CARDIAC) 20 MG/ML IV SOLN
INTRAVENOUS | Status: AC
  Filled 2014-03-12: qty 5

## 2014-03-12 MED ORDER — PROPOFOL 10 MG/ML IV BOLUS
INTRAVENOUS | Status: AC
  Filled 2014-03-12: qty 20

## 2014-03-12 MED ORDER — FENTANYL CITRATE 0.05 MG/ML IJ SOLN
INTRAMUSCULAR | Status: AC
  Filled 2014-03-12: qty 5

## 2014-03-12 MED ORDER — OXYCODONE HCL 5 MG PO TABS
5.0000 mg | ORAL_TABLET | Freq: Four times a day (QID) | ORAL | Status: DC | PRN
  Administered 2014-03-12: 5 mg via ORAL

## 2014-03-12 MED ORDER — OXYCODONE HCL 5 MG PO TABS
ORAL_TABLET | ORAL | Status: AC
  Filled 2014-03-12: qty 1

## 2014-03-12 MED ORDER — SODIUM CHLORIDE 0.9 % IV SOLN
INTRAVENOUS | Status: DC
  Administered 2014-03-12: 10:00:00 via INTRAVENOUS

## 2014-03-12 MED ORDER — MIDAZOLAM HCL 2 MG/2ML IJ SOLN
INTRAMUSCULAR | Status: AC
  Filled 2014-03-12: qty 2

## 2014-03-12 SURGICAL SUPPLY — 39 items
BAG DECANTER FOR FLEXI CONT (MISCELLANEOUS) IMPLANT
BANDAGE GAUZE ELAST BULKY 4 IN (GAUZE/BANDAGES/DRESSINGS) IMPLANT
BLADE 10 SAFETY STRL DISP (BLADE) ×3 IMPLANT
BLADE SURG ROTATE 9660 (MISCELLANEOUS) IMPLANT
CANISTER SUCTION 2500CC (MISCELLANEOUS) ×3 IMPLANT
CHLORAPREP W/TINT 26ML (MISCELLANEOUS) IMPLANT
CONT SPEC STER OR (MISCELLANEOUS) IMPLANT
COVER SURGICAL LIGHT HANDLE (MISCELLANEOUS) ×3 IMPLANT
DRAPE INCISE IOBAN 66X45 STRL (DRAPES) IMPLANT
DRAPE PED LAPAROTOMY (DRAPES) ×3 IMPLANT
DRAPE PROXIMA HALF (DRAPES) IMPLANT
DRSG ADAPTIC 3X8 NADH LF (GAUZE/BANDAGES/DRESSINGS) IMPLANT
DRSG PAD ABDOMINAL 8X10 ST (GAUZE/BANDAGES/DRESSINGS) ×3 IMPLANT
DRSG VAC ATS LRG SENSATRAC (GAUZE/BANDAGES/DRESSINGS) IMPLANT
DRSG VAC ATS MED SENSATRAC (GAUZE/BANDAGES/DRESSINGS) IMPLANT
DRSG VAC ATS SM SENSATRAC (GAUZE/BANDAGES/DRESSINGS) IMPLANT
ELECT CAUTERY BLADE 6.4 (BLADE) ×3 IMPLANT
ELECT REM PT RETURN 9FT ADLT (ELECTROSURGICAL) ×3
ELECTRODE REM PT RTRN 9FT ADLT (ELECTROSURGICAL) ×1 IMPLANT
GLOVE BIO SURGEON STRL SZ 6.5 (GLOVE) ×2 IMPLANT
GLOVE BIO SURGEONS STRL SZ 6.5 (GLOVE) ×1
GLOVE SURG SS PI 6.0 STRL IVOR (GLOVE) ×3 IMPLANT
GOWN STRL REUS W/ TWL LRG LVL3 (GOWN DISPOSABLE) ×2 IMPLANT
GOWN STRL REUS W/TWL LRG LVL3 (GOWN DISPOSABLE) ×6
HANDPIECE INTERPULSE COAX TIP (DISPOSABLE)
KIT BASIN OR (CUSTOM PROCEDURE TRAY) ×3 IMPLANT
KIT ROOM TURNOVER OR (KITS) ×3 IMPLANT
NS IRRIG 1000ML POUR BTL (IV SOLUTION) ×3 IMPLANT
PACK GENERAL/GYN (CUSTOM PROCEDURE TRAY) ×3 IMPLANT
PAD ARMBOARD 7.5X6 YLW CONV (MISCELLANEOUS) ×6 IMPLANT
SET HNDPC FAN SPRY TIP SCT (DISPOSABLE) IMPLANT
SPONGE GAUZE 4X4 12PLY (GAUZE/BANDAGES/DRESSINGS) ×3 IMPLANT
SURGILUBE 2OZ TUBE FLIPTOP (MISCELLANEOUS) IMPLANT
SUT VIC AB 5-0 PS2 18 (SUTURE) IMPLANT
SWAB COLLECTION DEVICE MRSA (MISCELLANEOUS) IMPLANT
TOWEL OR 17X24 6PK STRL BLUE (TOWEL DISPOSABLE) ×3 IMPLANT
TOWEL OR 17X26 10 PK STRL BLUE (TOWEL DISPOSABLE) ×3 IMPLANT
TUBE ANAEROBIC SPECIMEN COL (MISCELLANEOUS) IMPLANT
UNDERPAD 30X30 INCONTINENT (UNDERPADS AND DIAPERS) ×3 IMPLANT

## 2014-03-12 NOTE — Progress Notes (Signed)
Pt requested something for pain. Dr Darrick Meigs paged to return call.

## 2014-03-12 NOTE — Progress Notes (Signed)
Bed placement called with info for pt to be admitted.

## 2014-03-12 NOTE — Progress Notes (Signed)
Received a call from the nurse that repeat BMP istat showed normal potassium, of 4.7, previous potassium of 6.2 was a lab error, patient still in short stay and has not been admitted. Will cancel the admission and patient can go back to skilled facility.

## 2014-03-12 NOTE — Progress Notes (Signed)
I stat done after ordered by Dr Ermalene Postin. Results called to Dr Iran Planas and Dr Darrick Meigs. Potassium on I stat 4.7. Dr Iran Planas states to refer to Medical. Dr Darrick Meigs called and states that pt can be sent back to [facility] and Dr Iran Planas will follow up with them at a later time.

## 2014-03-12 NOTE — Progress Notes (Signed)
[place] called and informed of events from today, and that Dr Sonda Rumble would be in touch at a later time.

## 2014-03-12 NOTE — Progress Notes (Signed)
Dr Darrick Meigs here to see patient and has requested a bed for the pt on a tele unit bed placement called and informed to call me when bed ready.

## 2014-03-12 NOTE — Progress Notes (Signed)
Dr Darrick Meigs paged due to pt with c/o pain

## 2014-03-12 NOTE — Progress Notes (Signed)
Ptar called and informed of pt to be sent back to [facility].

## 2014-03-12 NOTE — Progress Notes (Signed)
Bed placement called and cancelled bed request.

## 2014-03-12 NOTE — H&P (Addendum)
PCP:   Alvester Chou, NP   Chief Complaint:  Hyperkalemia  HPI:  68 year old female with history of chronic diastolic heart failure, hypertension, chronic left leg wound, diabetes Mellitus, GERD, has h/o PE, not on anticoagulation secondary to bleeding from the left leg wound, s/o IVC filter placement, chronic anemia, morbid obesity came in for debridement of the left leg wound by Dr Ashley Mariner. Patient earlier was admitted on 03/03/2014 for wound VAC placement. At that time patient was diagnosis hyperkalemia and was admitted to [facility] service. Hyperkalemia at that time was admitted to Bactrim which was discontinued. Patient was discharged to [facility] rehabilitation facility on doxycycline and Cipro. Today patient came to the hospital for debridement of the chronic left leg wound, and the preop labs again revealed hyperkalemia with potassium 6.2. [facility] consulted for admission and further management of hyperkalemia. Patient denies any symptoms, no chest pain shortness of breath, no vomiting or diarrhea. She does complain of intermittent nausea.  Nursing home records reviewed, shows Bactrim twice a day on the medication list. Called and confirmed with nursing home the patient was not given Bactrim since 03/05/2014.  Allergies:   Allergies  Allergen Reactions  . Bactrim [Sulfamethoxazole-Tmp Ds]     Hyperkalemia (July 2015)  . Penicillins Hives      Past Medical History  Diagnosis Date  . Hypertension   . Hyperlipidemia   . CHF (congestive heart failure)   . GERD (gastroesophageal reflux disease)   . Depression   . Sleep apnea     doesn't use cpap machine or 02 at night  . Headache(784.0)     Migraines  . Arthritis     knees  . Anemia     Had blood transfusion x 2 in yhe 90s  . Lymphedema of leg   . Pulmonary embolism 02/18/14    diagnosed at [facility] with CT angio chest  . Chronic indwelling Foley catheter   . Diabetes mellitus     not on medications    Past Surgical History    Procedure Laterality Date  . Rotator cuff repair      right  . Knee arthroscopy      right  . Temple biopsy    . Colonoscopy with propofol  09/23/2012    Procedure: COLONOSCOPY WITH PROPOFOL;  Surgeon: Jerene Bears, MD;  Location: [facility];  Service: Gastroenterology;  Laterality: N/A;  . Paniculectomy with wound debridement Left 02/08/2014    left medial thigh  . Irrigation and debridement abscess Left 02/24/2014    Procedure: MINOR INCISION AND DRAINAGE OF ABSCESS;  Surgeon: Theodoro Kos, DO;  Location: [facility]S;  Service: Plastics;  Laterality: Left;  . I&d extremity Left 03/03/2014    Procedure: IRRIGATION AND DEBRIDEMENT LEFT LEG WOUND AND PLACEMENT OF A CELL AND VAC;  Surgeon: Theodoro Kos, DO;  Location: [place];  Service: Plastics;  Laterality: Left;    Prior to Admission medications   Medication Sig Start Date End Date Taking? Authorizing Provider  allopurinol (ZYLOPRIM) 100 MG tablet Take 100 mg by mouth daily.    Yes Historical Provider, MD  aspirin 325 MG EC tablet Take 325 mg by mouth daily.   Yes Historical Provider, MD  atorvastatin (LIPITOR) 40 MG tablet Take 40 mg by mouth daily.    Yes Historical Provider, MD  chlorthalidone (HYGROTON) 25 MG tablet Take 12.5 mg by mouth daily.   Yes Historical Provider, MD  ciprofloxacin (CIPRO) 500 MG tablet Take 1 tablet (500 mg total) by  mouth 2 (two) times daily. 03/05/14 03/15/14 Yes Charlynne Cousins, MD  doxycycline (VIBRA-TABS) 100 MG tablet Take 1 tablet (100 mg total) by mouth every 12 (twelve) hours. 03/05/14 03/15/14 Yes Charlynne Cousins, MD  esomeprazole (NEXIUM) 40 MG capsule Take 40 mg by mouth daily before breakfast.   Yes Historical Provider, MD  gabapentin (NEURONTIN) 300 MG capsule Take 300 mg by mouth 3 (three) times daily.   Yes Historical Provider, MD  isosorbide mononitrate (ISMO,MONOKET) 10 MG tablet Take 10 mg by mouth 2 (two) times daily.   Yes Historical Provider, MD  magnesium oxide (MAG-OX) 400 MG tablet Take  400 mg by mouth daily.    Yes Historical Provider, MD  metoprolol tartrate (LOPRESSOR) 25 MG tablet Take 37.5 mg by mouth 2 (two) times daily.    Yes Historical Provider, MD  oxyCODONE (OXY IR/ROXICODONE) 5 MG immediate release tablet Take 1 tablet (5 mg total) by mouth every 4 (four) hours as needed for severe pain (for pain). 03/08/14  Yes Tiffany L Reed, DO  polyethylene glycol (MIRALAX / GLYCOLAX) packet Take 17 g by mouth daily.   Yes Historical Provider, MD  promethazine (PHENERGAN) 25 MG tablet Take 25 mg by mouth 2 (two) times daily as needed for nausea or vomiting.   Yes Historical Provider, MD  zolpidem (AMBIEN CR) 6.25 MG CR tablet Take 6.25 mg by mouth at bedtime as needed for sleep.   Yes Historical Provider, MD    Social History:  reports that she quit smoking about 32 years ago. Her smoking use included Cigarettes. She smoked 0.00 packs per day. She has never used smokeless tobacco. She reports that she does not drink alcohol or use illicit drugs.  Family History  Problem Relation Age of Onset  . Emphysema Father   . Asthma Brother   . Heart disease Mother   . Stomach cancer Brother   . Heart disease Maternal Grandmother   . Diabetes Mother   . Diabetes Sister      All the positives are listed in BOLD  Review of Systems:  HEENT: Headache, blurred vision, runny nose, sore throat Neck: Hypothyroidism, hyperthyroidism,,lymphadenopathy Chest : Shortness of breath, history of COPD, Asthma Heart : Chest pain, history of coronary arterey disease GI:  Nausea, vomiting, diarrhea, constipation, GERD GU: Dysuria, urgency, frequency of urination, hematuria Neuro: Stroke, seizures, syncope Psych: Depression, anxiety, hallucinations   Physical Exam: Blood pressure 139/43, pulse 66, temperature 97.7 F (36.5 C), temperature source Oral, resp. rate 20, height 5\' 4"  (1.626 m), weight 146.682 kg (323 lb 6 oz), SpO2 100.00%. Constitutional:   Patient is a morbidly obese female* in  no acute distress and cooperative with exam. Head: Normocephalic and atraumatic Mouth: Mucus membranes moist Eyes: PERRL, EOMI, conjunctivae normal Neck: Supple, No Thyromegaly Cardiovascular: RRR, S1 normal, S2 normal Pulmonary/Chest: CTAB, no wheezes, rales, or rhonchi Abdominal: Soft. Non-tender, non-distended, bowel sounds are normal, no masses, organomegaly, or guarding present.  Neurological: A&O x3, Strenght is normal and symmetric bilaterally, cranial nerve II-XII are grossly intact, no focal motor deficit, sensory intact to light touch bilaterally.  Extremities : Marked lymphedema of the lower extremities, wound VAC in place on the left thigh  Labs on Admission:  Basic Metabolic Panel:  Recent Labs Lab 03/12/14 0800  NA 139  K 6.2*  CL 105  CO2 21  GLUCOSE 98  BUN 23  CREATININE 1.17*  CALCIUM 8.0*   Liver Function Tests: No results found for this basename: AST, ALT, ALKPHOS,  BILITOT, PROT, ALBUMIN,  in the last 168 hours No results found for this basename: LIPASE, AMYLASE,  in the last 168 hours No results found for this basename: AMMONIA,  in the last 168 hours CBC:  Recent Labs Lab 03/12/14 0800  WBC 6.2  NEUTROABS 3.6  HGB 8.8*  HCT 27.6*  MCV 92.0  PLT 278   Cardiac Enzymes: No results found for this basename: CKTOTAL, CKMB, CKMBINDEX, TROPONINI,  in the last 168 hours  BNP (last 3 results) No results found for this basename: PROBNP,  in the last 8760 hours CBG:  Recent Labs Lab 03/12/14 0811 03/12/14 1013  GLUCAP 94 92    Radiological Exams on Admission: No results found.  EKG: Independently reviewed. Normal sinus rhythm, peak T waves noted in the lead 1 and aVL   Assessment/Plan Active Problems:   Hyperkalemia   chronic leg wound   Hyperkalemia, recurrent ? Cause, as per the skilled facility patient was not getting Bactrim though it was on the [facility] from the skilled facility. Patient was on Cozaar which was discontinued in the previous  admission she is also on metoprolol which can cause hyperkalemia. Patient will be given 1 dose  of Kayexalate, will admit the patient to monitor in telemetry. I will hold magnesium oxide at this time and repeat magnesium level. I will also repeat BMP to check if it wasn't lab error.  Chronic leg wound   Patient is supposed to go debridement of the left leg wound, surgery has been canceled at this time due to hyperkalemia. Plastic surgery should be informed once the electrolytes have normalized for debridement in the hospital. We'll get a wound care consult to follow while the patient is under [facility] service. I will continue the patient on doxycycline and Cipro.  Diabetes mellitus Hemoglobin A1c is 6.1%, diet controlled.  Hypertension Continue home medications including chlorthalidone, metoprolol  History of pulmonary embolism Patient is not on anticoagulation due to bleeding from the left leg wound. She status post IVC filter. O2 sats 100% on room air  DVT prophylaxis Lovenox  Code status: Full code  Family discussion: No family at bedside, discussed the patient in detail.   Time Spent on Admission: 65 minutes   Jacquetta Polhamus S [facility] Pager: [phone] 03/12/2014, 10:42 AM  If 7PM-7AM, please contact night-coverage  [email or web address]  Password [email or web address]

## 2014-03-12 NOTE — Progress Notes (Addendum)
Call from Dr Ermalene Postin and LR stopped at this time. Potassium level noted.

## 2014-03-15 ENCOUNTER — Non-Acute Institutional Stay (SKILLED_NURSING_FACILITY): Payer: Medicare Other | Admitting: Internal Medicine

## 2014-03-15 ENCOUNTER — Encounter (HOSPITAL_BASED_OUTPATIENT_CLINIC_OR_DEPARTMENT_OTHER): Payer: Medicare Other | Attending: Plastic Surgery

## 2014-03-15 DIAGNOSIS — K219 Gastro-esophageal reflux disease without esophagitis: Secondary | ICD-10-CM

## 2014-03-15 DIAGNOSIS — M79606 Pain in leg, unspecified: Secondary | ICD-10-CM

## 2014-03-15 DIAGNOSIS — M79609 Pain in unspecified limb: Secondary | ICD-10-CM

## 2014-03-15 MED ORDER — CLINDAMYCIN PHOSPHATE 600 MG/50ML IV SOLN
600.0000 mg | Freq: Once | INTRAVENOUS | Status: AC
  Administered 2014-03-16: 600 mg via INTRAVENOUS
  Filled 2014-03-15: qty 50

## 2014-03-16 ENCOUNTER — Encounter (HOSPITAL_COMMUNITY): Payer: Medicare Other | Admitting: Anesthesiology

## 2014-03-16 ENCOUNTER — Encounter (HOSPITAL_COMMUNITY): Payer: Self-pay | Admitting: *Deleted

## 2014-03-16 ENCOUNTER — Encounter (HOSPITAL_COMMUNITY)
Admission: RE | Disposition: A | Discharge status: Skilled Nursing Facility | Payer: Self-pay | Source: Ambulatory Visit | Attending: Plastic Surgery

## 2014-03-16 ENCOUNTER — Other Ambulatory Visit: Payer: Self-pay | Admitting: *Deleted

## 2014-03-16 ENCOUNTER — Ambulatory Visit (HOSPITAL_COMMUNITY): Payer: Medicare Other | Admitting: Anesthesiology

## 2014-03-16 ENCOUNTER — Ambulatory Visit (HOSPITAL_COMMUNITY)
Admission: RE | Admit: 2014-03-16 | Discharge: 2014-03-16 | Disposition: A | Discharge status: Skilled Nursing Facility | Payer: Medicare Other | Source: Ambulatory Visit | Attending: Plastic Surgery | Admitting: Plastic Surgery

## 2014-03-16 DIAGNOSIS — K219 Gastro-esophageal reflux disease without esophagitis: Secondary | ICD-10-CM | POA: Insufficient documentation

## 2014-03-16 DIAGNOSIS — I509 Heart failure, unspecified: Secondary | ICD-10-CM | POA: Insufficient documentation

## 2014-03-16 DIAGNOSIS — I890 Lymphedema, not elsewhere classified: Secondary | ICD-10-CM | POA: Insufficient documentation

## 2014-03-16 DIAGNOSIS — Z87891 Personal history of nicotine dependence: Secondary | ICD-10-CM | POA: Diagnosis not present

## 2014-03-16 DIAGNOSIS — S71109A Unspecified open wound, unspecified thigh, initial encounter: Principal | ICD-10-CM | POA: Insufficient documentation

## 2014-03-16 DIAGNOSIS — E119 Type 2 diabetes mellitus without complications: Secondary | ICD-10-CM | POA: Diagnosis not present

## 2014-03-16 DIAGNOSIS — Z7901 Long term (current) use of anticoagulants: Secondary | ICD-10-CM | POA: Insufficient documentation

## 2014-03-16 DIAGNOSIS — S71009A Unspecified open wound, unspecified hip, initial encounter: Secondary | ICD-10-CM | POA: Insufficient documentation

## 2014-03-16 DIAGNOSIS — Z86711 Personal history of pulmonary embolism: Secondary | ICD-10-CM | POA: Diagnosis not present

## 2014-03-16 DIAGNOSIS — I10 Essential (primary) hypertension: Secondary | ICD-10-CM | POA: Diagnosis not present

## 2014-03-16 DIAGNOSIS — X58XXXA Exposure to other specified factors, initial encounter: Secondary | ICD-10-CM | POA: Insufficient documentation

## 2014-03-16 DIAGNOSIS — S71102D Unspecified open wound, left thigh, subsequent encounter: Secondary | ICD-10-CM

## 2014-03-16 DIAGNOSIS — S71002D Unspecified open wound, left hip, subsequent encounter: Secondary | ICD-10-CM

## 2014-03-16 DIAGNOSIS — G4730 Sleep apnea, unspecified: Secondary | ICD-10-CM | POA: Insufficient documentation

## 2014-03-16 HISTORY — PX: INCISION AND DRAINAGE OF WOUND: SHX1803

## 2014-03-16 LAB — BASIC METABOLIC PANEL
ANION GAP: 12 (ref 5–15)
BUN: 20 mg/dL (ref 6–23)
CO2: 23 meq/L (ref 19–32)
CREATININE: 1.03 mg/dL (ref 0.50–1.10)
Calcium: 8.4 mg/dL (ref 8.4–10.5)
Chloride: 104 mEq/L (ref 96–112)
GFR calc non Af Amer: 55 mL/min — ABNORMAL LOW (ref >90)
GFR, EST AFRICAN AMERICAN: 64 mL/min — AB (ref >90)
Glucose, Bld: 94 mg/dL (ref 70–99)
Potassium: 5.3 mEq/L (ref 3.7–5.3)
Sodium: 139 mEq/L (ref 137–147)

## 2014-03-16 LAB — CBC
HCT: 27.5 % — ABNORMAL LOW (ref 36.0–46.0)
Hemoglobin: 8.8 g/dL — ABNORMAL LOW (ref 12.0–15.0)
MCH: 29.1 pg (ref 26.0–34.0)
MCHC: 32 g/dL (ref 30.0–36.0)
MCV: 91.1 fL (ref 78.0–100.0)
Platelets: 232 10*3/uL (ref 150–400)
RBC: 3.02 MIL/uL — ABNORMAL LOW (ref 3.87–5.11)
RDW: 15.1 % (ref 11.5–15.5)
WBC: 6 10*3/uL (ref 4.0–10.5)

## 2014-03-16 LAB — GLUCOSE, CAPILLARY
Glucose-Capillary: 91 mg/dL (ref 70–99)
Glucose-Capillary: 85 mg/dL (ref 70–99)
Glucose-Capillary: 81 mg/dL (ref 70–99)

## 2014-03-16 SURGERY — IRRIGATION AND DEBRIDEMENT WOUND
Anesthesia: General | Site: Thigh | Laterality: Left

## 2014-03-16 MED ORDER — PROPOFOL 10 MG/ML IV BOLUS
INTRAVENOUS | Status: DC | PRN
  Administered 2014-03-16: 20 mg via INTRAVENOUS
  Administered 2014-03-16: 150 mg via INTRAVENOUS

## 2014-03-16 MED ORDER — MIDAZOLAM HCL 5 MG/5ML IJ SOLN
INTRAMUSCULAR | Status: DC | PRN
  Administered 2014-03-16: 0.5 mg via INTRAVENOUS
  Administered 2014-03-16: 1.5 mg via INTRAVENOUS

## 2014-03-16 MED ORDER — LACTATED RINGERS IV SOLN
INTRAVENOUS | Status: DC | PRN
  Administered 2014-03-16 (×2): via INTRAVENOUS

## 2014-03-16 MED ORDER — SODIUM CHLORIDE 0.9 % IR SOLN
Status: DC | PRN
  Administered 2014-03-16: 1000 mL

## 2014-03-16 MED ORDER — OXYCODONE HCL 10 MG PO TABS: ORAL_TABLET | ORAL | Status: DC

## 2014-03-16 MED ORDER — MIDAZOLAM HCL 2 MG/2ML IJ SOLN
INTRAMUSCULAR | Status: AC
  Filled 2014-03-16: qty 2

## 2014-03-16 MED ORDER — LIDOCAINE HCL (CARDIAC) 20 MG/ML IV SOLN
INTRAVENOUS | Status: DC | PRN
  Administered 2014-03-16: 100 mg via INTRAVENOUS

## 2014-03-16 MED ORDER — FENTANYL CITRATE 0.05 MG/ML IJ SOLN
INTRAMUSCULAR | Status: AC
  Filled 2014-03-16: qty 5

## 2014-03-16 MED ORDER — HYDROMORPHONE HCL PF 1 MG/ML IJ SOLN
INTRAMUSCULAR | Status: AC
  Filled 2014-03-16: qty 1

## 2014-03-16 MED ORDER — LIDOCAINE HCL (CARDIAC) 20 MG/ML IV SOLN
INTRAVENOUS | Status: AC
  Filled 2014-03-16: qty 5

## 2014-03-16 MED ORDER — ONDANSETRON HCL 4 MG/2ML IJ SOLN
INTRAMUSCULAR | Status: AC
  Filled 2014-03-16: qty 2

## 2014-03-16 MED ORDER — FENTANYL CITRATE 0.05 MG/ML IJ SOLN
INTRAMUSCULAR | Status: DC | PRN
  Administered 2014-03-16 (×2): 50 ug via INTRAVENOUS

## 2014-03-16 MED ORDER — ONDANSETRON HCL 4 MG/2ML IJ SOLN
INTRAMUSCULAR | Status: DC | PRN
  Administered 2014-03-16: 4 mg via INTRAVENOUS

## 2014-03-16 MED ORDER — PROPOFOL 10 MG/ML IV BOLUS
INTRAVENOUS | Status: AC
  Filled 2014-03-16: qty 20

## 2014-03-16 MED ORDER — PROMETHAZINE HCL 25 MG/ML IJ SOLN
6.2500 mg | INTRAMUSCULAR | Status: DC | PRN
Start: 2014-03-16 — End: 2014-03-16

## 2014-03-16 MED ORDER — LACTATED RINGERS IV SOLN
INTRAVENOUS | Status: DC
  Administered 2014-03-16: 12:00:00 via INTRAVENOUS

## 2014-03-16 MED ORDER — HYDROMORPHONE HCL PF 1 MG/ML IJ SOLN
0.2500 mg | INTRAMUSCULAR | Status: DC | PRN
  Administered 2014-03-16 (×2): 0.5 mg via INTRAVENOUS

## 2014-03-16 MED ORDER — ARTIFICIAL TEARS OP OINT
TOPICAL_OINTMENT | OPHTHALMIC | Status: AC
  Filled 2014-03-16: qty 3.5

## 2014-03-16 SURGICAL SUPPLY — 53 items
APL SKNCLS STERI-STRIP NONHPOA (GAUZE/BANDAGES/DRESSINGS) ×4
BAG DECANTER FOR FLEXI CONT (MISCELLANEOUS) IMPLANT
BANDAGE GAUZE ELAST BULKY 4 IN (GAUZE/BANDAGES/DRESSINGS) IMPLANT
BENZOIN TINCTURE PRP APPL 2/3 (GAUZE/BANDAGES/DRESSINGS) ×8 IMPLANT
BLADE 10 SAFETY STRL DISP (BLADE) ×3 IMPLANT
BLADE SURG ROTATE 9660 (MISCELLANEOUS) IMPLANT
BNDG COHESIVE 6X5 TAN STRL LF (GAUZE/BANDAGES/DRESSINGS) ×2 IMPLANT
CANISTER SUCTION 2500CC (MISCELLANEOUS) ×3 IMPLANT
CHLORAPREP W/TINT 26ML (MISCELLANEOUS) IMPLANT
CONT SPEC STER OR (MISCELLANEOUS) IMPLANT
COVER SURGICAL LIGHT HANDLE (MISCELLANEOUS) ×3 IMPLANT
DRAPE INCISE IOBAN 66X45 STRL (DRAPES) ×2 IMPLANT
DRAPE ORTHO SPLIT 77X108 STRL (DRAPES) ×6
DRAPE PED LAPAROTOMY (DRAPES) ×1 IMPLANT
DRAPE PROXIMA HALF (DRAPES) IMPLANT
DRAPE SURG ORHT 6 SPLT 77X108 (DRAPES) IMPLANT
DRSG ADAPTIC 3X8 NADH LF (GAUZE/BANDAGES/DRESSINGS) ×4 IMPLANT
DRSG PAD ABDOMINAL 8X10 ST (GAUZE/BANDAGES/DRESSINGS) ×1 IMPLANT
DRSG VAC ATS LRG SENSATRAC (GAUZE/BANDAGES/DRESSINGS) ×2 IMPLANT
DRSG VAC ATS MED SENSATRAC (GAUZE/BANDAGES/DRESSINGS) IMPLANT
DRSG VAC ATS SM SENSATRAC (GAUZE/BANDAGES/DRESSINGS) IMPLANT
ELECT CAUTERY BLADE 6.4 (BLADE) ×3 IMPLANT
ELECT REM PT RETURN 9FT ADLT (ELECTROSURGICAL) ×3
ELECTRODE REM PT RTRN 9FT ADLT (ELECTROSURGICAL) ×1 IMPLANT
GLOVE BIO SURGEON STRL SZ 6.5 (GLOVE) ×2 IMPLANT
GLOVE BIO SURGEONS STRL SZ 6.5 (GLOVE) ×1
GLOVE SURG SS PI 6.0 STRL IVOR (GLOVE) ×3 IMPLANT
GOWN STRL REUS W/ TWL LRG LVL3 (GOWN DISPOSABLE) ×2 IMPLANT
GOWN STRL REUS W/TWL LRG LVL3 (GOWN DISPOSABLE) ×6
HANDPIECE INTERPULSE COAX TIP (DISPOSABLE)
KIT BASIN OR (CUSTOM PROCEDURE TRAY) ×3 IMPLANT
KIT REMOVER STAPLE SKIN (MISCELLANEOUS) ×2 IMPLANT
KIT ROOM TURNOVER OR (KITS) ×3 IMPLANT
MATRIX SURGICAL PSM 10X15CM (Tissue) ×4 IMPLANT
MICROMATRIX 1000MG (Tissue) ×6 IMPLANT
NS IRRIG 1000ML POUR BTL (IV SOLUTION) ×3 IMPLANT
PACK GENERAL/GYN (CUSTOM PROCEDURE TRAY) ×3 IMPLANT
PAD ARMBOARD 7.5X6 YLW CONV (MISCELLANEOUS) ×6 IMPLANT
SET HNDPC FAN SPRY TIP SCT (DISPOSABLE) IMPLANT
SOLUTION PARTIC MCRMTRX 1000MG (Tissue) IMPLANT
SPONGE GAUZE 4X4 12PLY (GAUZE/BANDAGES/DRESSINGS) ×1 IMPLANT
STOCKINETTE IMPERVIOUS LG (DRAPES) ×2 IMPLANT
SURGILUBE 2OZ TUBE FLIPTOP (MISCELLANEOUS) ×2 IMPLANT
SUT ETHILON 2 0 FS 18 (SUTURE) ×2 IMPLANT
SUT ETHILON 3 0 PS 1 (SUTURE) ×2 IMPLANT
SUT VIC AB 3-0 PS2 18 (SUTURE) ×3
SUT VIC AB 3-0 PS2 18XBRD (SUTURE) IMPLANT
SUT VIC AB 5-0 PS2 18 (SUTURE) IMPLANT
SWAB COLLECTION DEVICE MRSA (MISCELLANEOUS) IMPLANT
TOWEL OR 17X24 6PK STRL BLUE (TOWEL DISPOSABLE) ×3 IMPLANT
TOWEL OR 17X26 10 PK STRL BLUE (TOWEL DISPOSABLE) ×3 IMPLANT
TUBE ANAEROBIC SPECIMEN COL (MISCELLANEOUS) IMPLANT
UNDERPAD 30X30 INCONTINENT (UNDERPADS AND DIAPERS) ×1 IMPLANT

## 2014-03-16 NOTE — Interval H&P Note (Signed)
History and Physical Interval Note:  03/16/2014 2:04 PM  Last surgery cancelled due to high K+, following cancelling of case lab informed of hemolyzed specimen. Repeat K+ normal.   Kelly [name]  has presented today for surgery, with the diagnosis of OPEN WOUND OF KNEE LET (EXCEPT THIGH) AND ANKLE WITHOUT MENTION OF COMPLICATION  The various methods of treatment have been discussed with the patient and family. After consideration of risks, benefits and other options for treatment, the patient has consented to  Procedure(s): IRRIGATION AND DEBRIDEMENT OF LEFT LEG WOUND (Left) , possible A Cell application, possible partial closure as a surgical intervention .  The patient's history has been reviewed, patient examined, no change in status, stable for surgery.  I have reviewed the patient's chart and labs.  Questions were answered to the patient's satisfaction.     [name]

## 2014-03-16 NOTE — Transfer of Care (Signed)
Immediate Anesthesia Transfer of Care Note  Patient: Kelly [name]  Procedure(s) Performed: Procedure(s): IRRIGATION AND DEBRIDEMENT OF LEFT THIGH WOUND, APPLICATION ACELL (Left)  Patient Location: PACU  Anesthesia Type:General  Level of Consciousness: awake, alert , oriented and patient cooperative  Airway & Oxygen Therapy: Patient Spontanous Breathing and Patient connected to nasal cannula oxygen  Post-op Assessment: Report given to PACU RN, Post -op Vital signs reviewed and stable and Patient moving all extremities  Post vital signs: Reviewed and stable  Complications: No apparent anesthesia complications

## 2014-03-16 NOTE — Anesthesia Preprocedure Evaluation (Addendum)
Anesthesia Evaluation  Patient identified by MRN, date of birth, ID band Patient awake    Reviewed: Allergy & Precautions, H&P , NPO status , Patient's Chart, lab work & pertinent test results, reviewed documented beta blocker date and time   History of Anesthesia Complications (+) PONVNegative for: history of anesthetic complications  Airway Mallampati: II TM Distance: >3 FB Neck ROM: Full    Dental  (+) Partial Lower, Partial Upper, Dental Advisory Given   Pulmonary sleep apnea , former smoker,    Pulmonary exam normal       Cardiovascular hypertension, Pt. on home beta blockers and Pt. on medications + Peripheral Vascular Disease and +CHF     Neuro/Psych  Headaches, PSYCHIATRIC DISORDERS Depression    GI/Hepatic Neg liver ROS, GERD-  Medicated and Poorly Controlled,  Endo/Other  diabetes, Type 2Morbid obesity  Renal/GU Renal InsufficiencyRenal disease     Musculoskeletal   Abdominal   Peds  Hematology   Anesthesia Other Findings   Reproductive/Obstetrics                        Anesthesia Physical Anesthesia Plan  ASA: III  Anesthesia Plan: General   Post-op Pain Management:    Induction: Intravenous  Airway Management Planned: LMA  Additional Equipment:   Intra-op Plan:   Post-operative Plan: Extubation in OR  Informed Consent: I have reviewed the patients History and Physical, chart, labs and discussed the procedure including the risks, benefits and alternatives for the proposed anesthesia with the patient or authorized representative who has indicated his/her understanding and acceptance.   Dental advisory given  Plan Discussed with: CRNA, Anesthesiologist and Surgeon  Anesthesia Plan Comments:        Anesthesia Quick Evaluation

## 2014-03-16 NOTE — Anesthesia Postprocedure Evaluation (Signed)
Anesthesia Post Note  Patient: Kelly [name]  Procedure(s) Performed: Procedure(s) (LRB): IRRIGATION AND DEBRIDEMENT OF LEFT THIGH WOUND, APPLICATION ACELL (Left)  Anesthesia type: general  Patient location: PACU  Post pain: Pain level controlled  Post assessment: Patient's Cardiovascular Status Stable  Last Vitals:  Filed Vitals:   03/16/14 1550  BP:   Pulse:   Temp: 36.4 C  Resp:     Post vital signs: Reviewed and stable  Level of consciousness: sedated  Complications: No apparent anesthesia complications

## 2014-03-16 NOTE — Op Note (Signed)
Operative Note   DATE OF OPERATION: 7.21.2015  LOCATION: [facility]- outpatient  SURGICAL DIVISION: Plastic Surgery  PREOPERATIVE DIAGNOSES:  1. History nontraumatic hematoma left thigh 2. Lymphedema 3 Pulmonary embolus 4. Open wound left thigh  POSTOPERATIVE DIAGNOSES:  same  PROCEDURE:  1. Preparation for grafting 50 cm2   2. Application A Cell Matrix 150 cm2 3. Simple closure left thigh 6 cm  SURGEON: Irene Limbo MD MBA  ASSISTANT: [place] PA-C  ANESTHESIA:  General.   EBL: minimal  COMPLICATIONS: None.   INDICATIONS FOR PROCEDURE:  The patient, Kelly [name], is a 68 y.o. female born on 06-22-1946, is here for treatment of left thigh wound.   FINDINGS: Wound near completely granulated.   DESCRIPTION OF PROCEDURE:  The patient's operative site was marked with the patient in the preoperative area. The patient was taken to the operating room. IV antibiotics were given. The patient's operative site was prepped and draped in a sterile fashion. A time out was performed and all information was confirmed to be correct.  Small area of necrotic subcutaneous fat excised with scissors over lower skin flap. Wound irrigated. Skin edges excised proximal extent of incision to allow for primary closure. 2-0 and 3-0 nylon used in vertical mattress fashion to approximate wound edges proximal extent of wound. Remainder of wound treated with A Cell matrix powder and perforated sheet. Adaptic applied follow by lubricant and wound VAC applied. VAC set to 125 mm Hg continuous.   The patient was allowed to wake from anesthesia, extubated and taken to the recovery room in satisfactory condition.   SPECIMENS: none  DRAINS: wound VAC  Irene Limbo, MD [facility] Plastic & Reconstructive Surgery [phone]

## 2014-03-16 NOTE — H&P (View-Only) (Signed)
  Patient of Drs. Marks and Sanger that nderwent a left leg tissue resection (panniculectomy) at [facility]. Wound was approximated and left open to heal by partial secondary intention healing. She developed hematoma after starting anticoagulation for PE. She has undergone multiple debridements and A Cell placement by Dr. Migdalia Dk for treatment of wound. Planned procedure for repeat A Cell application and VAC.  Last procedure marked by hyperkalemia.  PE  Alert Clear poor BS posteriorly Regular rate Abdomen soft obese L thigh: VAC dressing not intact, draining-- this was removed. Wound large, clean, >95% granulated   A/P Procedure cancelled due to hyperkalemia. Will be admitted for treatment and work up. Wet to dry dressings for now.  Will try to reschedule procedure for VAC, A Cell and partial closure next week.The patient has a home VAC which the facility did not transport with patient, Will need this prior to discharge.  Irene Limbo, MD [facility] Plastic & Reconstructive Surgery [phone]

## 2014-03-16 NOTE — Telephone Encounter (Signed)
[facility] 

## 2014-03-16 NOTE — Progress Notes (Signed)
Patient ID: Kelly [name], female   DOB: 15-May-1946, 68 y.o.   MRN: [id number]           PROGRESS NOTE  DATE: 03/10/2014       FACILITY:  [facility]  LEVEL OF CARE: SNF (31)  Acute Visit  CHIEF COMPLAINT:  Manage anemia.    HISTORY OF PRESENT ILLNESS: I was requested by the staff to assess the patient regarding above problem(s):  ANEMIA: The anemia has been stable. The patient denies fatigue, melena or hematochezia. No complications from the medications currently being used.  On 03/09/2014:  Hemoglobin 8.2, MCV 91.  On 03/03/2014:  Hemoglobin 8.5.    PAST MEDICAL HISTORY : Reviewed.  No changes/see problem list  CURRENT MEDICATIONS: Reviewed per MAR/see medication list  PHYSICAL EXAMINATION  VS:  T 99       P 74      RR 18      BP 140/70     POX 95%           GENERAL: no acute distress, morbidly obese body habitus RESPIRATORY: breathing is even & unlabored, BS CTAB CARDIAC: RRR, no murmur,no extra heart sounds, +3 bilateral lower extremity edema     ASSESSMENT/PLAN:  Anemia of chronic disease.  Hemoglobin slightly declined.  We will monitor.     CPT CODE: 98338          Harlym Gehling Y Attila Mccarthy, [place] [phone]

## 2014-03-16 NOTE — Anesthesia Procedure Notes (Signed)
Procedure Name: LMA Insertion Date/Time: 03/16/2014 3:02 PM Performed by: Williemae Area B Pre-anesthesia Checklist: Patient identified, Emergency Drugs available, Suction available and Patient being monitored Patient Re-evaluated:Patient Re-evaluated prior to inductionOxygen Delivery Method: Circle system utilized Preoxygenation: Pre-oxygenation with 100% oxygen Intubation Type: IV induction Ventilation: Mask ventilation without difficulty LMA: LMA inserted LMA Size: 4.0 Number of attempts: 1 Airway Equipment and Method: Patient positioned with wedge pillow (bed pillow under upper back) Placement Confirmation: breath sounds checked- equal and bilateral and positive ETCO2 Tube secured with: Tape (taped across cheeks) Dental Injury: Teeth and Oropharynx as per pre-operative assessment

## 2014-03-18 ENCOUNTER — Encounter (HOSPITAL_COMMUNITY): Payer: Self-pay | Admitting: Plastic Surgery

## 2014-03-19 NOTE — Progress Notes (Signed)
Patient ID: Kelly [name], female   DOB: [birth year], 68 y.o.   MRN: [id number]           PROGRESS NOTE  DATE: 03/15/2014            FACILITY:  [facility]  LEVEL OF CARE: SNF (31)  Acute Visit  CHIEF COMPLAINT:  Manage left leg pain and GERD.    HISTORY OF PRESENT ILLNESS: I was requested by the staff to assess the patient regarding above problem(s):  LEFT LEG PAIN:  I was requested by the treatment nurse to assess the patient for pain management.  She has a large surgical incision in her left thigh.  The patient is complaining of uncontrolled pain on current oxycodone 5 mg q.4 p.r.n.    GERD: pt's GERD is uncontrolled.  The patient was on Nexium which was changed to Prilosec, after which she is not having good control of her GERD.   She is requesting to go back on Nexium.  Denies abd. Pain, nausea or vomiting.  Currently on a PPI & tolerates it without any adverse reactions.    PAST MEDICAL HISTORY : Reviewed.  No changes/see problem list  CURRENT MEDICATIONS: Reviewed per MAR/see medication list  REVIEW OF SYSTEMS:  GENERAL: no change in appetite, no fatigue, no weight changes, no fever, chills or weakness RESPIRATORY: no cough, SOB, DOE,, wheezing, hemoptysis CARDIAC: no chest pain or palpitations;  chronic lower extremity swelling     GI: uncontrolled heartburn, no abdominal pain, diarrhea, constipation, nausea or vomiting  PHYSICAL EXAMINATION  VS:  T 96.7       P 72      RR 18      BP 132/76          GENERAL: no acute distress, morbidly obese body habitus NECK: supple, trachea midline, no neck masses, no thyroid tenderness, no thyromegaly RESPIRATORY: breathing is even & unlabored, BS CTAB CARDIAC: RRR, no murmur,no extra heart sounds, +3 bilateral lower extremity edema     GI: abdomen soft, normal BS, no masses, no tenderness, no hepatomegaly, no splenomegaly PSYCHIATRIC: the patient is alert & oriented to person, affect & behavior  appropriate MUSCULOSKELETAL: left lower extremity has extensive surgical wound on the medial thigh just above the knee, the wound itself is clean          ASSESSMENT/PLAN:              Left leg pain.  Uncontrolled.  Increase oxycodone IR to 10 mg q.4 p.r.n.    GERD.  Uncontrolled problem.  Discontinue Prilosec.  Start Nexium 40 mg q.d.    CPT CODE: 50277         Livia Tarr Y Lowen Barringer, [place] [phone]

## 2014-03-20 DIAGNOSIS — K219 Gastro-esophageal reflux disease without esophagitis: Secondary | ICD-10-CM | POA: Insufficient documentation

## 2014-03-24 NOTE — Progress Notes (Signed)
Spoke with Sharyn Lull, nurse for Mrs. Kelly [name] at [facility]. She verified allergies, medical hx and meds - no changes since pt was here on 03/16/14. I gave Sharyn Lull pre-op instructions and she states pt will be transported via EMS. I told her to have them bring her to Short Stay. Per Sharyn Lull, pt is alert and oriented and able to sign her consent form.

## 2014-03-25 ENCOUNTER — Ambulatory Visit (HOSPITAL_COMMUNITY): Payer: Medicare Other | Admitting: Anesthesiology

## 2014-03-25 ENCOUNTER — Ambulatory Visit (HOSPITAL_COMMUNITY)
Admission: RE | Admit: 2014-03-25 | Discharge: 2014-03-25 | Disposition: A | Discharge status: Skilled Nursing Facility | Payer: Medicare Other | Source: Ambulatory Visit | Attending: Plastic Surgery | Admitting: Plastic Surgery

## 2014-03-25 ENCOUNTER — Encounter (HOSPITAL_COMMUNITY): Payer: Medicare Other | Admitting: Anesthesiology

## 2014-03-25 ENCOUNTER — Encounter (HOSPITAL_COMMUNITY)
Admission: RE | Disposition: A | Discharge status: Skilled Nursing Facility | Payer: Self-pay | Source: Ambulatory Visit | Attending: Plastic Surgery

## 2014-03-25 ENCOUNTER — Encounter (HOSPITAL_COMMUNITY): Payer: Self-pay | Admitting: Plastic Surgery

## 2014-03-25 ENCOUNTER — Other Ambulatory Visit: Payer: Self-pay | Admitting: Plastic Surgery

## 2014-03-25 DIAGNOSIS — S91002S Unspecified open wound, left ankle, sequela: Secondary | ICD-10-CM

## 2014-03-25 DIAGNOSIS — I509 Heart failure, unspecified: Secondary | ICD-10-CM | POA: Diagnosis not present

## 2014-03-25 DIAGNOSIS — E119 Type 2 diabetes mellitus without complications: Secondary | ICD-10-CM | POA: Diagnosis not present

## 2014-03-25 DIAGNOSIS — Z86711 Personal history of pulmonary embolism: Secondary | ICD-10-CM | POA: Diagnosis not present

## 2014-03-25 DIAGNOSIS — K219 Gastro-esophageal reflux disease without esophagitis: Secondary | ICD-10-CM | POA: Insufficient documentation

## 2014-03-25 DIAGNOSIS — S81009A Unspecified open wound, unspecified knee, initial encounter: Secondary | ICD-10-CM | POA: Insufficient documentation

## 2014-03-25 DIAGNOSIS — I10 Essential (primary) hypertension: Secondary | ICD-10-CM | POA: Insufficient documentation

## 2014-03-25 DIAGNOSIS — X58XXXA Exposure to other specified factors, initial encounter: Secondary | ICD-10-CM | POA: Diagnosis not present

## 2014-03-25 DIAGNOSIS — S81002S Unspecified open wound, left knee, sequela: Secondary | ICD-10-CM

## 2014-03-25 DIAGNOSIS — S81809A Unspecified open wound, unspecified lower leg, initial encounter: Principal | ICD-10-CM

## 2014-03-25 DIAGNOSIS — F3289 Other specified depressive episodes: Secondary | ICD-10-CM | POA: Diagnosis not present

## 2014-03-25 DIAGNOSIS — S91009A Unspecified open wound, unspecified ankle, initial encounter: Principal | ICD-10-CM

## 2014-03-25 DIAGNOSIS — S81802S Unspecified open wound, left lower leg, sequela: Principal | ICD-10-CM

## 2014-03-25 DIAGNOSIS — F329 Major depressive disorder, single episode, unspecified: Secondary | ICD-10-CM | POA: Insufficient documentation

## 2014-03-25 DIAGNOSIS — Z87891 Personal history of nicotine dependence: Secondary | ICD-10-CM | POA: Diagnosis not present

## 2014-03-25 DIAGNOSIS — G4730 Sleep apnea, unspecified: Secondary | ICD-10-CM | POA: Insufficient documentation

## 2014-03-25 HISTORY — PX: INCISION AND DRAINAGE OF WOUND: SHX1803

## 2014-03-25 LAB — CBC
HCT: 30.3 % — ABNORMAL LOW (ref 36.0–46.0)
HEMOGLOBIN: 9.5 g/dL — AB (ref 12.0–15.0)
MCH: 28.7 pg (ref 26.0–34.0)
MCHC: 31.4 g/dL (ref 30.0–36.0)
MCV: 91.5 fL (ref 78.0–100.0)
Platelets: 212 10*3/uL (ref 150–400)
RBC: 3.31 MIL/uL — ABNORMAL LOW (ref 3.87–5.11)
RDW: 16.3 % — ABNORMAL HIGH (ref 11.5–15.5)
WBC: 7.4 10*3/uL (ref 4.0–10.5)

## 2014-03-25 LAB — GLUCOSE, CAPILLARY
GLUCOSE-CAPILLARY: 117 mg/dL — AB (ref 70–99)
GLUCOSE-CAPILLARY: 125 mg/dL — AB (ref 70–99)

## 2014-03-25 SURGERY — IRRIGATION AND DEBRIDEMENT WOUND
Anesthesia: General | Site: Leg Lower | Laterality: Left

## 2014-03-25 MED ORDER — ROCURONIUM BROMIDE 50 MG/5ML IV SOLN
INTRAVENOUS | Status: AC
  Filled 2014-03-25: qty 1

## 2014-03-25 MED ORDER — OXYCODONE HCL 5 MG PO TABS: 5.0000 mg | ORAL_TABLET | Freq: Once | ORAL | Status: DC | PRN

## 2014-03-25 MED ORDER — LIDOCAINE HCL 4 % MT SOLN
OROMUCOSAL | Status: DC | PRN
  Administered 2014-03-25: 3 mL via TOPICAL

## 2014-03-25 MED ORDER — FENTANYL CITRATE 0.05 MG/ML IJ SOLN
INTRAMUSCULAR | Status: AC
  Filled 2014-03-25: qty 5

## 2014-03-25 MED ORDER — MIDAZOLAM HCL 2 MG/2ML IJ SOLN
INTRAMUSCULAR | Status: AC
  Filled 2014-03-25: qty 2

## 2014-03-25 MED ORDER — CIPROFLOXACIN IN D5W 400 MG/200ML IV SOLN
400.0000 mg | INTRAVENOUS | Status: DC
Start: 2014-03-25 — End: 2014-03-25

## 2014-03-25 MED ORDER — OXYCODONE HCL 5 MG/5ML PO SOLN: 5.0000 mg | Freq: Once | ORAL | Status: DC | PRN

## 2014-03-25 MED ORDER — 0.9 % SODIUM CHLORIDE (POUR BTL) OPTIME
TOPICAL | Status: DC | PRN
  Administered 2014-03-25: 1000 mL

## 2014-03-25 MED ORDER — HYDROMORPHONE HCL PF 1 MG/ML IJ SOLN: 0.2500 mg | INTRAMUSCULAR | Status: DC | PRN

## 2014-03-25 MED ORDER — EPHEDRINE SULFATE 50 MG/ML IJ SOLN
INTRAMUSCULAR | Status: DC | PRN
  Administered 2014-03-25 (×3): 10 mg via INTRAVENOUS

## 2014-03-25 MED ORDER — FENTANYL CITRATE 0.05 MG/ML IJ SOLN
INTRAMUSCULAR | Status: DC | PRN
  Administered 2014-03-25: 50 ug via INTRAVENOUS

## 2014-03-25 MED ORDER — SUCCINYLCHOLINE CHLORIDE 20 MG/ML IJ SOLN
INTRAMUSCULAR | Status: DC | PRN
  Administered 2014-03-25: 100 mg via INTRAVENOUS

## 2014-03-25 MED ORDER — ARTIFICIAL TEARS OP OINT
TOPICAL_OINTMENT | OPHTHALMIC | Status: AC
  Filled 2014-03-25: qty 3.5

## 2014-03-25 MED ORDER — CIPROFLOXACIN IN D5W 400 MG/200ML IV SOLN
INTRAVENOUS | Status: AC
  Administered 2014-03-25: 400 mg via INTRAVENOUS
  Filled 2014-03-25: qty 200

## 2014-03-25 MED ORDER — ONDANSETRON HCL 4 MG/2ML IJ SOLN
INTRAMUSCULAR | Status: AC
  Filled 2014-03-25: qty 2

## 2014-03-25 MED ORDER — LIDOCAINE HCL (CARDIAC) 20 MG/ML IV SOLN
INTRAVENOUS | Status: DC | PRN
  Administered 2014-03-25: 40 mg via INTRAVENOUS

## 2014-03-25 MED ORDER — MIDAZOLAM HCL 5 MG/5ML IJ SOLN
INTRAMUSCULAR | Status: DC | PRN
  Administered 2014-03-25: 1 mg via INTRAVENOUS

## 2014-03-25 MED ORDER — LIDOCAINE HCL (CARDIAC) 20 MG/ML IV SOLN
INTRAVENOUS | Status: AC
  Filled 2014-03-25: qty 5

## 2014-03-25 MED ORDER — POLYMYXIN B SULFATE 500000 UNITS IJ SOLR
INTRAMUSCULAR | Status: DC | PRN
Start: 2014-03-25 — End: 2014-03-25
  Administered 2014-03-25: 14:00:00

## 2014-03-25 MED ORDER — ONDANSETRON HCL 4 MG/2ML IJ SOLN
INTRAMUSCULAR | Status: DC | PRN
  Administered 2014-03-25: 4 mg via INTRAVENOUS

## 2014-03-25 MED ORDER — PROPOFOL 10 MG/ML IV BOLUS
INTRAVENOUS | Status: DC | PRN
  Administered 2014-03-25: 100 mg via INTRAVENOUS

## 2014-03-25 MED ORDER — PROPOFOL 10 MG/ML IV BOLUS
INTRAVENOUS | Status: AC
  Filled 2014-03-25: qty 20

## 2014-03-25 MED ORDER — SUCCINYLCHOLINE CHLORIDE 20 MG/ML IJ SOLN
INTRAMUSCULAR | Status: AC
  Filled 2014-03-25: qty 1

## 2014-03-25 MED ORDER — LACTATED RINGERS IV SOLN
INTRAVENOUS | Status: DC
  Administered 2014-03-25: 12:00:00 via INTRAVENOUS

## 2014-03-25 MED ORDER — ARTIFICIAL TEARS OP OINT
TOPICAL_OINTMENT | OPHTHALMIC | Status: DC | PRN
  Administered 2014-03-25: 1 via OPHTHALMIC

## 2014-03-25 SURGICAL SUPPLY — 41 items
BAG DECANTER FOR FLEXI CONT (MISCELLANEOUS) IMPLANT
BLADE SURG ROTATE 9660 (MISCELLANEOUS) IMPLANT
CANISTER SUCTION 2500CC (MISCELLANEOUS) ×3 IMPLANT
CHLORAPREP W/TINT 26ML (MISCELLANEOUS) IMPLANT
CONT SPEC 4OZ CLIKSEAL STRL BL (MISCELLANEOUS) ×2 IMPLANT
CONT SPEC STER OR (MISCELLANEOUS) IMPLANT
COVER SURGICAL LIGHT HANDLE (MISCELLANEOUS) ×3 IMPLANT
DRAPE INCISE IOBAN 66X45 STRL (DRAPES) ×2 IMPLANT
DRAPE PED LAPAROTOMY (DRAPES) ×3 IMPLANT
DRAPE PROXIMA HALF (DRAPES) ×2 IMPLANT
DRSG ADAPTIC 3X8 NADH LF (GAUZE/BANDAGES/DRESSINGS) ×4 IMPLANT
DRSG PAD ABDOMINAL 8X10 ST (GAUZE/BANDAGES/DRESSINGS) ×1 IMPLANT
DRSG VAC ATS LRG SENSATRAC (GAUZE/BANDAGES/DRESSINGS) IMPLANT
DRSG VAC ATS MED SENSATRAC (GAUZE/BANDAGES/DRESSINGS) ×2 IMPLANT
DRSG VAC ATS SM SENSATRAC (GAUZE/BANDAGES/DRESSINGS) IMPLANT
ELECT CAUTERY BLADE 6.4 (BLADE) ×3 IMPLANT
ELECT REM PT RETURN 9FT ADLT (ELECTROSURGICAL) ×3
ELECTRODE REM PT RTRN 9FT ADLT (ELECTROSURGICAL) ×1 IMPLANT
GLOVE BIO SURGEON STRL SZ 6.5 (GLOVE) ×3 IMPLANT
GLOVE BIO SURGEONS STRL SZ 6.5 (GLOVE) ×2
GLOVE BIOGEL PI IND STRL 6.5 (GLOVE) IMPLANT
GLOVE BIOGEL PI INDICATOR 6.5 (GLOVE) ×2
GLOVE SURG SS PI 6.5 STRL IVOR (GLOVE) ×2 IMPLANT
GOWN STRL REUS W/ TWL LRG LVL3 (GOWN DISPOSABLE) ×2 IMPLANT
GOWN STRL REUS W/TWL LRG LVL3 (GOWN DISPOSABLE) ×9
KIT BASIN OR (CUSTOM PROCEDURE TRAY) ×3 IMPLANT
KIT ROOM TURNOVER OR (KITS) ×3 IMPLANT
MATRIX SURGICAL PSM 7X10CM (Tissue) ×2 IMPLANT
MICROMATRIX 1000MG (Tissue) ×6 IMPLANT
NS IRRIG 1000ML POUR BTL (IV SOLUTION) ×3 IMPLANT
PACK GENERAL/GYN (CUSTOM PROCEDURE TRAY) ×3 IMPLANT
PAD ARMBOARD 7.5X6 YLW CONV (MISCELLANEOUS) ×6 IMPLANT
SOLUTION BETADINE 4OZ (MISCELLANEOUS) ×2 IMPLANT
SOLUTION PARTIC MCRMTRX 1000MG (Tissue) IMPLANT
SURGILUBE 2OZ TUBE FLIPTOP (MISCELLANEOUS) ×2 IMPLANT
SUT VIC AB 5-0 PS2 18 (SUTURE) IMPLANT
SWAB COLLECTION DEVICE MRSA (MISCELLANEOUS) IMPLANT
TOWEL OR 17X24 6PK STRL BLUE (TOWEL DISPOSABLE) ×3 IMPLANT
TOWEL OR 17X26 10 PK STRL BLUE (TOWEL DISPOSABLE) ×3 IMPLANT
TUBE ANAEROBIC SPECIMEN COL (MISCELLANEOUS) IMPLANT
UNDERPAD 30X30 INCONTINENT (UNDERPADS AND DIAPERS) ×3 IMPLANT

## 2014-03-25 NOTE — H&P (View-Only) (Signed)
1 Day Post-Op  Subjective: The patient is doing well overall and her pain is much improved with the VAC on the wound.  No sign of infection.  Objective: Vital signs in last 24 hours: Temp:  [98.4 F (36.9 C)-99.9 F (37.7 C)] 98.5 F (36.9 C) (07/02 1100) Pulse Rate:  [51-96] 77 (07/02 1603) Resp:  [13-23] 13 (07/02 1603) BP: (96-179)/(30-127) 96/30 mmHg (07/02 1603) SpO2:  [93 %-100 %] 100 % (07/02 1603) Last BM Date: 02/22/14  Intake/Output from previous day: 07/01 0701 - 07/02 0700 In: 800 [I.V.:500; IV Piggyback:300] Out: 1675 [Urine:875; Drains:600; Blood:200] Intake/Output this shift: Total I/O In: 100 [IV Piggyback:100] Out: 575 [Urine:375; Drains:200]  General appearance: alert, cooperative and no distress  Lab Results:   Recent Labs  02/24/14 0335 02/25/14 0345  WBC 10.1 10.4  HGB 8.8* 7.9*  HCT 26.2* 24.4*  PLT 266 259   BMET  Recent Labs  02/24/14 0335 02/25/14 0345  NA 137 136*  K 5.2 5.8*  CL 109 108  CO2 18* 20  GLUCOSE 180* 146*  BUN 31* 31*  CREATININE 1.31* 1.42*  CALCIUM 7.8* 7.7*   PT/INR No results found for this basename: LABPROT, INR,  in the last 72 hours ABG No results found for this basename: PHART, PCO2, PO2, HCO3,  in the last 72 hours  Studies/Results: Ir Ivc Filter Plmt / S&i /img Guid/mod Sed  02/23/2014   CLINICAL DATA:  History of pulmonary embolism. Anticoagulation cannot be continued due to bleeding from the left lower extremity. Request is been made to place an IVC filter.  EXAM: 1. ULTRASOUND GUIDANCE FOR VASCULAR ACCESS OF THE RIGHT INTERNAL JUGULAR VEIN. 2. IVC VENOGRAM. 3. PERCUTANEOUS IVC FILTER PLACEMENT.  ANESTHESIA/SEDATION: 2.0 mg IV Versed; 100 mcg IV Fentanyl.  Total Moderate Sedation Time  51minutes.  CONTRAST:  CO2  FLUOROSCOPY TIME:  48 seconds.  PROCEDURE: The procedure, risks, benefits, and alternatives were explained to the patient. Questions regarding the procedure were encouraged and answered. The  patient understands and consents to the procedure.  The right neck was prepped with Betadine in a sterile fashion, and a sterile drape was applied covering the operative field. A sterile gown and sterile gloves were used for the procedure. Local anesthesia was provided with 1% Lidocaine.  Ultrasound was used to confirm patency of the right internal jugular vein. Under direct ultrasound guidance, a 21 gauge needle was advanced into the right internal jugular vein with ultrasound image documentation performed. After securing access with a micropuncture dilator, a guidewire was advanced into the inferior vena cava. A deployment sheath was advanced over the guidewire. This was utilized to perform IVC venography.  The deployment sheath was further positioned in an appropriate location for filter deployment. A Bard Denali IVC filter was then advanced in the sheath. This was then fully deployed in the infrarenal IVC. Final filter position was confirmed with a fluoroscopic spot image. Contrast injection was also performed through the sheath under fluoroscopy to confirm patency of the IVC at the level of the filter. After the procedure the sheath was removed and hemostasis obtained with manual compression.  COMPLICATIONS: None.  FINDINGS: IVC venography demonstrates a normal caliber IVC with no evidence of thrombus. Renal veins are identified bilaterally. The IVC filter was successfully positioned below the level of the renal veins and is appropriately oriented. This IVC filter has both permanent and retrievable indications.  IMPRESSION: Placement of percutaneous IVC filter in infrarenal IVC. IVC venogram shows no evidence of IVC  thrombus and normal caliber of the inferior vena cava. This filter does have both permanent and retrievable indications.   Electronically Signed   By: Aletta Edouard M.D.   On: 02/23/2014 17:19    Anti-infectives: Anti-infectives   Start     Dose/Rate Route Frequency Ordered Stop   02/25/14  1315  sulfamethoxazole-trimethoprim (BACTRIM DS) 800-160 MG per tablet 1 tablet     1 tablet Oral Every 12 hours 02/25/14 1313     02/24/14 1000  imipenem-cilastatin (PRIMAXIN) 500 mg in sodium chloride 0.9 % 100 mL IVPB  Status:  Discontinued     500 mg 200 mL/hr over 30 Minutes Intravenous Every 8 hours 02/24/14 0751 02/25/14 1313   02/24/14 0928  polymyxin B 500,000 Units, bacitracin 50,000 Units in sodium chloride irrigation 0.9 % 500 mL irrigation  Status:  Discontinued       As needed 02/24/14 0943 02/24/14 1020   02/21/14 2200  vancomycin (VANCOCIN) 1,750 mg in sodium chloride 0.9 % 500 mL IVPB  Status:  Discontinued     1,750 mg 250 mL/hr over 120 Minutes Intravenous Every 24 hours 02/21/14 0551 02/24/14 0727   02/21/14 1800  ceFEPIme (MAXIPIME) 2 g in dextrose 5 % 50 mL IVPB  Status:  Discontinued     2 g 100 mL/hr over 30 Minutes Intravenous Every 12 hours 02/21/14 0553 02/24/14 0727   02/20/14 2345  vancomycin (VANCOCIN) 1,500 mg in sodium chloride 0.9 % 500 mL IVPB     1,500 mg 250 mL/hr over 120 Minutes Intravenous  Once 02/20/14 2341 02/25/14 0731   02/20/14 2345  ceFEPIme (MAXIPIME) 2 g in dextrose 5 % 50 mL IVPB     2 g 100 mL/hr over 30 Minutes Intravenous  Once 02/20/14 2344 02/21/14 0714      Assessment/Plan: s/p Procedure(s): MINOR INCISION AND DRAINAGE OF ABSCESS (Left) Discharge per primary team. Have the patient on the schedule for VAC change in the OR on Monday [facility].  Can change that to Outpatient if the patient is discharge.  Will plan to do VAC changes in the OR weekly for around one month.  LOS: 5 days    [facility] 02/25/2014

## 2014-03-25 NOTE — Discharge Instructions (Signed)

## 2014-03-25 NOTE — Anesthesia Preprocedure Evaluation (Addendum)
Anesthesia Evaluation  Patient identified by MRN, date of birth, ID band Patient awake    Reviewed: Allergy & Precautions, H&P , NPO status , Patient's Chart, lab work & pertinent test results, reviewed documented beta blocker date and time   Airway Mallampati: II TM Distance: <3 FB Neck ROM: Full    Dental no notable dental hx. (+) Partial Lower, Partial Upper, Dental Advisory Given, Poor Dentition   Pulmonary sleep apnea , former smoker,    Pulmonary exam normal       Cardiovascular hypertension, Pt. on medications and Pt. on home beta blockers + Peripheral Vascular Disease and +CHF     Neuro/Psych  Headaches, Depression negative neurological ROS     GI/Hepatic Neg liver ROS, GERD-  Medicated and Controlled,  Endo/Other  negative endocrine ROSdiabetesMorbid obesity  Renal/GU negative Renal ROS  negative genitourinary   Musculoskeletal   Abdominal   Peds  Hematology negative hematology ROS (+) anemia ,   Anesthesia Other Findings   Reproductive/Obstetrics negative OB ROS                          Anesthesia Physical Anesthesia Plan  ASA: III  Anesthesia Plan: General   Post-op Pain Management:    Induction: Intravenous  Airway Management Planned: Oral ETT  Additional Equipment:   Intra-op Plan:   Post-operative Plan: Extubation in OR  Informed Consent: I have reviewed the patients History and Physical, chart, labs and discussed the procedure including the risks, benefits and alternatives for the proposed anesthesia with the patient or authorized representative who has indicated his/her understanding and acceptance.   Dental advisory given  Plan Discussed with: CRNA  Anesthesia Plan Comments:         Anesthesia Quick Evaluation

## 2014-03-25 NOTE — Op Note (Signed)
Operative Note   DATE OF OPERATION: 03/25/2014  LOCATION: [facility] outpatient  SURGICAL DIVISION: Plastic Surgery  PREOPERATIVE DIAGNOSES:  Large left leg wound  POSTOPERATIVE DIAGNOSES:  same  PROCEDURE:  Preparation of wound bed for placement of Acell (2 gm powder and 7 x 10 cm) and VAC placement (16 x 6 x 4 cm)  SURGEON: [employer], DO  ASSISTANT: Shawn Rayburn, PA  ANESTHESIA:  General.   COMPLICATIONS: None.   INDICATIONS FOR PROCEDURE:  The patient, Kelly [name] is a 68 y.o. female born on [birth year], is here for treatment of left leg wound. MRN: [id number]  CONSENT:  Informed consent was obtained directly from the patient. Risks, benefits and alternatives were fully discussed. Specific risks including but not limited to bleeding, infection, hematoma, seroma, scarring, pain, infection, contracture, asymmetry, wound healing problems, and need for further surgery were all discussed. The patient did have an ample opportunity to have questions answered to satisfaction.   DESCRIPTION OF PROCEDURE:  The patient was taken to the operating room. The patient's operative site was prepped and draped in a sterile fashion. A time out was performed and all information was confirmed to be correct.  Sedation was administered.  The area was irrigated.  The scissors and pickups were used to debride the soft tissue.  Hemostasis was achieved with pressure.  The Acell powder was placed followed by the Acell sheet.  The adaptic was applied and the VAC.  There was an excellent seal.  The patient tolerated the procedure well.  There were no complications. The patient was allowed to wake and taken to the recovery room in satisfactory condition.

## 2014-03-25 NOTE — Brief Op Note (Signed)
03/25/2014  2:20 PM  PATIENT:  Kelly [name]  68 y.o. female  PRE-OPERATIVE DIAGNOSIS:  OPEN WOUND OF LEFT LEG  POST-OPERATIVE DIAGNOSIS:  OPEN WOUND OF LEFT LEG  PROCEDURE:  Procedure(s): IRRIGATION AND DEBRIDEMENT OF LEFT LEG WOUND WITH PLACEMENT OF ACELL/VAC (Left)  SURGEON:  Surgeon(s) and Role:    * Claire Sanger, DO - Primary  PHYSICIAN ASSISTANT: Shawn Rayburn, PA  ASSISTANTS: none   ANESTHESIA:   general  EBL:     BLOOD ADMINISTERED:none  DRAINS: none   LOCAL MEDICATIONS USED:  NONE  SPECIMEN:  No Specimen  DISPOSITION OF SPECIMEN:  N/A  COUNTS:  YES  TOURNIQUET:  * No tourniquets in log *  DICTATION: .Dragon Dictation  PLAN OF CARE: Discharge to home after PACU  PATIENT DISPOSITION:  PACU - hemodynamically stable.   Delay start of Pharmacological VTE agent (>24hrs) due to surgical blood loss or risk of bleeding: no

## 2014-03-25 NOTE — Transfer of Care (Signed)
Immediate Anesthesia Transfer of Care Note  Patient: Kelly [name]  Procedure(s) Performed: Procedure(s): IRRIGATION AND DEBRIDEMENT OF LEFT LEG WOUND WITH PLACEMENT OF ACELL/VAC (Left)  Patient Location: PACU  Anesthesia Type:General  Level of Consciousness: lethargic and responds to stimulation  Airway & Oxygen Therapy: Patient Spontanous Breathing and Patient connected to nasal cannula oxygen  Post-op Assessment: Report given to PACU RN  Post vital signs: Reviewed and stable  Complications: No apparent anesthesia complications

## 2014-03-25 NOTE — Anesthesia Postprocedure Evaluation (Signed)
  Anesthesia Post-op Note  Patient: Kelly [name]  Procedure(s) Performed: Procedure(s): IRRIGATION AND DEBRIDEMENT OF LEFT LEG WOUND WITH PLACEMENT OF ACELL/VAC (Left)  Patient Location: PACU  Anesthesia Type:General  Level of Consciousness: awake and alert   Airway and Oxygen Therapy: Patient Spontanous Breathing  Post-op Pain: none  Post-op Assessment: Post-op Vital signs reviewed, Patient's Cardiovascular Status Stable and Respiratory Function Stable  Post-op Vital Signs: Reviewed  Filed Vitals:   03/25/14 1525  BP: 102/55  Pulse: 71  Temp:   Resp: 18    Complications: No apparent anesthesia complications

## 2014-03-25 NOTE — Consult Note (Addendum)
WOC consult requested for left leg wound by primary team prior to the plastics team involvement.  Dr Migdalia Dk plans to take pt to the OR today for I&D and placement of ACell.   Please defer to plastics team for further plan of care and re-consult if further assistance is needed.  Thank-you,  Julien Girt MSN, [place], [place], [place], [place]

## 2014-03-25 NOTE — Anesthesia Procedure Notes (Signed)
Procedure Name: Intubation Date/Time: 03/25/2014 1:45 PM Performed by: Sampson Si E Pre-anesthesia Checklist: Patient identified, Emergency Drugs available, Suction available, Patient being monitored and Timeout performed Patient Re-evaluated:Patient Re-evaluated prior to inductionOxygen Delivery Method: Circle system utilized Preoxygenation: Pre-oxygenation with 100% oxygen Intubation Type: IV induction Ventilation: Mask ventilation without difficulty Laryngoscope Size: Mac and 3 Grade View: Grade I Tube type: Oral Tube size: 7.0 mm Number of attempts: 1 Airway Equipment and Method: Stylet and LTA kit utilized Placement Confirmation: ETT inserted through vocal cords under direct vision,  positive ETCO2 and breath sounds checked- equal and bilateral Secured at: 21 cm Tube secured with: Tape Dental Injury: Teeth and Oropharynx as per pre-operative assessment

## 2014-03-25 NOTE — Interval H&P Note (Signed)
History and Physical Interval Note:  03/25/2014 7:44 AM  Hassel Neth  has presented today for surgery, with the diagnosis of Open wound of knee, leg (except thigh), and ankle, without mention of complication   The various methods of treatment have been discussed with the patient and family. After consideration of risks, benefits and other options for treatment, the patient has consented to  Procedure(s): IRRIGATION AND DEBRIDEMENT OF LEFT LEG WOUND WITH PLACEMENT OF ACELL/VAC (Left) as a surgical intervention .  The patient's history has been reviewed, patient examined, no change in status, stable for surgery.  I have reviewed the patient's chart and labs.  Questions were answered to the patient's satisfaction.     SANGER,CLAIRE

## 2014-03-26 ENCOUNTER — Non-Acute Institutional Stay (SKILLED_NURSING_FACILITY): Payer: Medicare Other | Admitting: Internal Medicine

## 2014-03-26 DIAGNOSIS — I82409 Acute embolism and thrombosis of unspecified deep veins of unspecified lower extremity: Secondary | ICD-10-CM

## 2014-03-26 DIAGNOSIS — I82402 Acute embolism and thrombosis of unspecified deep veins of left lower extremity: Secondary | ICD-10-CM

## 2014-03-26 NOTE — Progress Notes (Signed)
Patient ID: Kelly [name], female   DOB: 09/18/1945, 68 y.o.   MRN: [id number] Facility; [facility] Chief complaint; left leg swelling and pain History; this is a medically complex patient who had a left leg  wound debridement and paniculectomy done earlier this year at [facility] . During this admission she had a pulmonary embolism and his as he was having bleeding from the original wound and surgical site that she had an IVC filter placed but I just don't have the exact dates.. she has been followed in the OR by Dr. Migdalia Dk of plastic surgery for serial debridement suspect, wound VAC change. She also has a history of chronic diastolic heart failure, hypertension, diabetes. Due to the fact she has an IVC filter she is not on anticoagulation currently.  Yesterday she noted swelling with increasing pain in the left foot and leg. Today the swelling has increased and has become more painful.. I was asked to see her because of this. She has not had a fever  Past Medical History  Diagnosis Date  . Hypertension   . Hyperlipidemia   . CHF (congestive heart failure)   . GERD (gastroesophageal reflux disease)   . Depression   . Sleep apnea     doesn't use cpap machine or 02 at night  . Headache(784.0)     Migraines  . Arthritis     knees  . Anemia     Had blood transfusion x 2 in yhe 90s  . Lymphedema of leg   . Pulmonary embolism 02/18/14    diagnosed at [facility] with [facility] angio chest  . Chronic indwelling Foley catheter   . Diabetes mellitus     not on medications   Past Surgical History  Procedure Laterality Date  . Rotator cuff repair      right  . Knee arthroscopy      right  . Temple biopsy    . Colonoscopy with propofol  09/23/2012    Procedure: COLONOSCOPY WITH PROPOFOL;  Surgeon: Jerene Bears, MD;  Location: [facility];  Service: Gastroenterology;  Laterality: N/A;  . Paniculectomy with wound debridement Left 02/08/2014    left medial thigh  . Irrigation and debridement abscess Left  02/24/2014    Procedure: MINOR INCISION AND DRAINAGE OF ABSCESS;  Surgeon: Theodoro Kos, DO;  Location: [facility];  Service: Plastics;  Laterality: Left;  . I&d extremity Left 03/03/2014    Procedure: IRRIGATION AND DEBRIDEMENT LEFT LEG WOUND AND PLACEMENT OF A CELL AND VAC;  Surgeon: Theodoro Kos, DO;  Location: [place];  Service: Plastics;  Laterality: Left;  . Incision and drainage of wound Left 03/16/2014    Procedure: IRRIGATION AND DEBRIDEMENT OF LEFT THIGH WOUND, APPLICATION ACELL;  Surgeon: Irene Limbo, MD;  Location: [facility];  Service: Plastics;  Laterality: Left;    Review of systems Respiratory; no shortness of breath Cardiac no chest pain GI no diarrhea Skin still SME sanguinous looking drainage coming from the wound VAC on the medial aspect of the left upper thigh  Physical examination Gen. the patient is not in any distress. Vitals O2 sat 95% respirations 18 pulse rate 67 Respiratory shallow air entry bilaterally but no crackles or wheezes Cardiac heart sounds are normal there is no signs of right ventricular failure no accentuation of the second sound. Left leg tense swelling of the left lower leg below the knee there is warmth and tenderness here. There is no signs of significant arterial insufficiency. The wound VAC is in  the upper medial left thigh I did not remove this. Nor did I inspect the wound  Impression/plan #1 almost certainly a DVT with pain and swelling in the left leg. This has apparently been quite rapidly progressive since yesterday. There may also be some involvement of the left upper thigh. It is comforting that she has a wound VAC which was apparently placed a month ago however the pain and swelling in her left leg is likely going to be a problem here. My feeling is that she will need to be anticoagulated with Lovenox. We coordinate this with Dr. Migdalia Dk strip to the OR we should be able to stop the Lovenox prior to the trips to outpatient surgery. The only concern  other concern is her anemia. She runs hemoglobins in the 8.1-8.4 range most recently on 7/15 it was 8.2. This is normochromic normocytic the exact etiology of this isn't completely clear although some of the sanguinous drainage the wound VAC may be contributing to this. A superficial review of her past medical history shows the anemia to be chronic, but I just don't see the actual diagnosis  We have ordered a duplex ultrasound of the leg at, I am fairly certain that this is going to come back positive. I will call Dr. Migdalia Dk to discuss any concern she has about hemostasis. However as of this moment I simply don't see an alternative to Lovenox which would give [place] the most abbreviated timeframe if we need to stop the drug for hemostasis. Transfusion here certainly possible    I have spoken to Dr. Migdalia Dk. She expresses no concern about hemostasis with regards to the wound that she is the [place]. I informed her that the patient will likely be on Lovenox next week, she can call to hold the second daily dose of Lovenox and she should be fine for a debridement procedure. However she told me she has not forecasting further debridement since the patient continues to look very well in terms of her wound appeared

## 2014-03-29 ENCOUNTER — Encounter (HOSPITAL_COMMUNITY): Payer: Self-pay | Admitting: Plastic Surgery

## 2014-03-29 ENCOUNTER — Encounter (HOSPITAL_COMMUNITY): Payer: Self-pay | Admitting: Pharmacy Technician

## 2014-03-30 ENCOUNTER — Other Ambulatory Visit: Payer: Self-pay | Admitting: Plastic Surgery

## 2014-03-30 DIAGNOSIS — L97122 Non-pressure chronic ulcer of left thigh with fat layer exposed: Secondary | ICD-10-CM

## 2014-03-31 ENCOUNTER — Ambulatory Visit (HOSPITAL_COMMUNITY): Payer: Medicare Other | Admitting: Anesthesiology

## 2014-03-31 ENCOUNTER — Encounter (HOSPITAL_COMMUNITY): Payer: Self-pay | Admitting: Certified Registered"

## 2014-03-31 ENCOUNTER — Ambulatory Visit (HOSPITAL_COMMUNITY)
Admission: RE | Admit: 2014-03-31 | Discharge: 2014-03-31 | Disposition: A | Discharge status: Skilled Nursing Facility | Payer: Medicare Other | Source: Ambulatory Visit | Attending: Plastic Surgery | Admitting: Plastic Surgery

## 2014-03-31 ENCOUNTER — Encounter (HOSPITAL_COMMUNITY): Payer: Medicare Other | Admitting: Anesthesiology

## 2014-03-31 ENCOUNTER — Encounter (HOSPITAL_COMMUNITY)
Admission: RE | Disposition: A | Discharge status: Skilled Nursing Facility | Payer: Self-pay | Source: Ambulatory Visit | Attending: Plastic Surgery

## 2014-03-31 DIAGNOSIS — L97122 Non-pressure chronic ulcer of left thigh with fat layer exposed: Secondary | ICD-10-CM

## 2014-03-31 DIAGNOSIS — I10 Essential (primary) hypertension: Secondary | ICD-10-CM | POA: Insufficient documentation

## 2014-03-31 DIAGNOSIS — S91009A Unspecified open wound, unspecified ankle, initial encounter: Principal | ICD-10-CM

## 2014-03-31 DIAGNOSIS — Y929 Unspecified place or not applicable: Secondary | ICD-10-CM | POA: Insufficient documentation

## 2014-03-31 DIAGNOSIS — X58XXXA Exposure to other specified factors, initial encounter: Secondary | ICD-10-CM | POA: Insufficient documentation

## 2014-03-31 DIAGNOSIS — E785 Hyperlipidemia, unspecified: Secondary | ICD-10-CM | POA: Insufficient documentation

## 2014-03-31 DIAGNOSIS — Z882 Allergy status to sulfonamides status: Secondary | ICD-10-CM | POA: Insufficient documentation

## 2014-03-31 DIAGNOSIS — Z881 Allergy status to other antibiotic agents status: Secondary | ICD-10-CM | POA: Insufficient documentation

## 2014-03-31 DIAGNOSIS — F329 Major depressive disorder, single episode, unspecified: Secondary | ICD-10-CM | POA: Insufficient documentation

## 2014-03-31 DIAGNOSIS — E119 Type 2 diabetes mellitus without complications: Secondary | ICD-10-CM | POA: Diagnosis not present

## 2014-03-31 DIAGNOSIS — F3289 Other specified depressive episodes: Secondary | ICD-10-CM | POA: Insufficient documentation

## 2014-03-31 DIAGNOSIS — G4730 Sleep apnea, unspecified: Secondary | ICD-10-CM | POA: Diagnosis not present

## 2014-03-31 DIAGNOSIS — K219 Gastro-esophageal reflux disease without esophagitis: Secondary | ICD-10-CM | POA: Diagnosis not present

## 2014-03-31 DIAGNOSIS — I509 Heart failure, unspecified: Secondary | ICD-10-CM | POA: Diagnosis not present

## 2014-03-31 DIAGNOSIS — S81809A Unspecified open wound, unspecified lower leg, initial encounter: Principal | ICD-10-CM

## 2014-03-31 DIAGNOSIS — Z7982 Long term (current) use of aspirin: Secondary | ICD-10-CM | POA: Diagnosis not present

## 2014-03-31 DIAGNOSIS — Z79899 Other long term (current) drug therapy: Secondary | ICD-10-CM | POA: Diagnosis not present

## 2014-03-31 DIAGNOSIS — S81009A Unspecified open wound, unspecified knee, initial encounter: Secondary | ICD-10-CM | POA: Insufficient documentation

## 2014-03-31 HISTORY — PX: INCISION AND DRAINAGE OF WOUND: SHX1803

## 2014-03-31 LAB — CBC
HCT: 29.8 % — ABNORMAL LOW (ref 36.0–46.0)
Hemoglobin: 9.4 g/dL — ABNORMAL LOW (ref 12.0–15.0)
MCH: 29.3 pg (ref 26.0–34.0)
MCHC: 31.5 g/dL (ref 30.0–36.0)
MCV: 92.8 fL (ref 78.0–100.0)
PLATELETS: 208 10*3/uL (ref 150–400)
RBC: 3.21 MIL/uL — ABNORMAL LOW (ref 3.87–5.11)
RDW: 16 % — AB (ref 11.5–15.5)
WBC: 7.1 10*3/uL (ref 4.0–10.5)

## 2014-03-31 LAB — BASIC METABOLIC PANEL
Anion gap: 12 (ref 5–15)
BUN: 21 mg/dL (ref 6–23)
CALCIUM: 8.4 mg/dL (ref 8.4–10.5)
CHLORIDE: 106 meq/L (ref 96–112)
CO2: 21 mEq/L (ref 19–32)
CREATININE: 1.18 mg/dL — AB (ref 0.50–1.10)
GFR calc non Af Amer: 47 mL/min — ABNORMAL LOW (ref >90)
GFR, EST AFRICAN AMERICAN: 54 mL/min — AB (ref >90)
Glucose, Bld: 124 mg/dL — ABNORMAL HIGH (ref 70–99)
Potassium: 4.7 mEq/L (ref 3.7–5.3)
Sodium: 139 mEq/L (ref 137–147)

## 2014-03-31 LAB — GLUCOSE, CAPILLARY
Glucose-Capillary: 132 mg/dL — ABNORMAL HIGH (ref 70–99)
Glucose-Capillary: 129 mg/dL — ABNORMAL HIGH (ref 70–99)

## 2014-03-31 SURGERY — IRRIGATION AND DEBRIDEMENT WOUND
Anesthesia: General | Site: Leg Upper | Laterality: Left

## 2014-03-31 MED ORDER — HYDROMORPHONE HCL PF 1 MG/ML IJ SOLN
0.2500 mg | INTRAMUSCULAR | Status: DC | PRN
  Administered 2014-03-31 (×3): 0.5 mg via INTRAVENOUS

## 2014-03-31 MED ORDER — SODIUM CHLORIDE 0.9 % IR SOLN
Status: DC | PRN
  Administered 2014-03-31: 1000 mL

## 2014-03-31 MED ORDER — OXYCODONE HCL 5 MG PO TABS: 5.0000 mg | ORAL_TABLET | Freq: Once | ORAL | Status: DC | PRN

## 2014-03-31 MED ORDER — SUCCINYLCHOLINE CHLORIDE 20 MG/ML IJ SOLN
INTRAMUSCULAR | Status: DC | PRN
  Administered 2014-03-31: 100 mg via INTRAVENOUS

## 2014-03-31 MED ORDER — OXYCODONE-ACETAMINOPHEN 5-325 MG PO TABS
ORAL_TABLET | ORAL | Status: AC
  Administered 2014-03-31: 2 via ORAL
  Filled 2014-03-31: qty 2

## 2014-03-31 MED ORDER — ONDANSETRON HCL 4 MG/2ML IJ SOLN
INTRAMUSCULAR | Status: DC | PRN
  Administered 2014-03-31: 4 mg via INTRAVENOUS

## 2014-03-31 MED ORDER — LIDOCAINE HCL (CARDIAC) 20 MG/ML IV SOLN
INTRAVENOUS | Status: DC | PRN
  Administered 2014-03-31: 80 mg via INTRAVENOUS

## 2014-03-31 MED ORDER — HYDROMORPHONE HCL PF 1 MG/ML IJ SOLN
INTRAMUSCULAR | Status: AC
  Administered 2014-03-31: 0.5 mg via INTRAVENOUS
  Filled 2014-03-31: qty 1

## 2014-03-31 MED ORDER — MIDAZOLAM HCL 2 MG/2ML IJ SOLN
INTRAMUSCULAR | Status: AC
  Filled 2014-03-31: qty 2

## 2014-03-31 MED ORDER — ONDANSETRON HCL 4 MG/2ML IJ SOLN: 4.0000 mg | Freq: Once | INTRAMUSCULAR | Status: DC | PRN

## 2014-03-31 MED ORDER — FENTANYL CITRATE 0.05 MG/ML IJ SOLN
INTRAMUSCULAR | Status: DC | PRN
  Administered 2014-03-31: 50 ug via INTRAVENOUS

## 2014-03-31 MED ORDER — MEPERIDINE HCL 25 MG/ML IJ SOLN: 6.2500 mg | INTRAMUSCULAR | Status: DC | PRN

## 2014-03-31 MED ORDER — PROPOFOL 10 MG/ML IV BOLUS
INTRAVENOUS | Status: AC
  Filled 2014-03-31: qty 20

## 2014-03-31 MED ORDER — OXYCODONE-ACETAMINOPHEN 5-325 MG PO TABS
2.0000 | ORAL_TABLET | Freq: Once | ORAL | Status: AC
  Administered 2014-03-31: 2 via ORAL

## 2014-03-31 MED ORDER — CIPROFLOXACIN IN D5W 400 MG/200ML IV SOLN
400.0000 mg | INTRAVENOUS | Status: AC
  Administered 2014-03-31: 400 mg via INTRAVENOUS
  Filled 2014-03-31: qty 200

## 2014-03-31 MED ORDER — FENTANYL CITRATE 0.05 MG/ML IJ SOLN
INTRAMUSCULAR | Status: AC
  Filled 2014-03-31: qty 5

## 2014-03-31 MED ORDER — OXYCODONE HCL 5 MG/5ML PO SOLN: 5.0000 mg | Freq: Once | ORAL | Status: DC | PRN

## 2014-03-31 MED ORDER — SODIUM CHLORIDE 0.9 % IV SOLN
INTRAVENOUS | Status: DC
  Administered 2014-03-31: 13:00:00 via INTRAVENOUS

## 2014-03-31 MED ORDER — PROPOFOL 10 MG/ML IV BOLUS
INTRAVENOUS | Status: DC | PRN
  Administered 2014-03-31: 150 mg via INTRAVENOUS

## 2014-03-31 MED ORDER — CIPROFLOXACIN IN D5W 400 MG/200ML IV SOLN: 400.0000 mg | INTRAVENOUS | Status: DC

## 2014-03-31 SURGICAL SUPPLY — 39 items
BAG DECANTER FOR FLEXI CONT (MISCELLANEOUS) IMPLANT
BLADE 10 SAFETY STRL DISP (BLADE) ×3 IMPLANT
BLADE SURG ROTATE 9660 (MISCELLANEOUS) IMPLANT
BNDG GAUZE ELAST 4 BULKY (GAUZE/BANDAGES/DRESSINGS) IMPLANT
CANISTER SUCTION 2500CC (MISCELLANEOUS) ×3 IMPLANT
CHLORAPREP W/TINT 26ML (MISCELLANEOUS) IMPLANT
CONT SPEC STER OR (MISCELLANEOUS) IMPLANT
COVER SURGICAL LIGHT HANDLE (MISCELLANEOUS) ×3 IMPLANT
DRAPE INCISE IOBAN 66X45 STRL (DRAPES) ×2 IMPLANT
DRAPE PED LAPAROTOMY (DRAPES) ×3 IMPLANT
DRAPE PROXIMA HALF (DRAPES) IMPLANT
DRSG ADAPTIC 3X8 NADH LF (GAUZE/BANDAGES/DRESSINGS) ×2 IMPLANT
DRSG PAD ABDOMINAL 8X10 ST (GAUZE/BANDAGES/DRESSINGS) ×1 IMPLANT
DRSG VAC ATS LRG SENSATRAC (GAUZE/BANDAGES/DRESSINGS) IMPLANT
DRSG VAC ATS MED SENSATRAC (GAUZE/BANDAGES/DRESSINGS) ×2 IMPLANT
DRSG VAC ATS SM SENSATRAC (GAUZE/BANDAGES/DRESSINGS) IMPLANT
ELECT CAUTERY BLADE 6.4 (BLADE) ×3 IMPLANT
ELECT REM PT RETURN 9FT ADLT (ELECTROSURGICAL) ×3
ELECTRODE REM PT RTRN 9FT ADLT (ELECTROSURGICAL) ×1 IMPLANT
GAUZE SPONGE 4X4 12PLY STRL (GAUZE/BANDAGES/DRESSINGS) ×1 IMPLANT
GLOVE BIO SURGEON STRL SZ 6.5 (GLOVE) ×2 IMPLANT
GLOVE BIO SURGEONS STRL SZ 6.5 (GLOVE) ×1
GOWN STRL REUS W/ TWL LRG LVL3 (GOWN DISPOSABLE) ×2 IMPLANT
GOWN STRL REUS W/TWL LRG LVL3 (GOWN DISPOSABLE) ×6
KIT BASIN OR (CUSTOM PROCEDURE TRAY) ×3 IMPLANT
KIT ROOM TURNOVER OR (KITS) ×3 IMPLANT
MATRIX SURGICAL PSM 7X10CM (Tissue) ×2 IMPLANT
MICROMATRIX 1000MG (Tissue) ×3 IMPLANT
NS IRRIG 1000ML POUR BTL (IV SOLUTION) ×3 IMPLANT
PACK GENERAL/GYN (CUSTOM PROCEDURE TRAY) ×3 IMPLANT
PAD ARMBOARD 7.5X6 YLW CONV (MISCELLANEOUS) ×6 IMPLANT
SOLUTION PARTIC MCRMTRX 1000MG (Tissue) IMPLANT
SURGILUBE 2OZ TUBE FLIPTOP (MISCELLANEOUS) ×2 IMPLANT
SUT VIC AB 5-0 PS2 18 (SUTURE) IMPLANT
SWAB COLLECTION DEVICE MRSA (MISCELLANEOUS) IMPLANT
TOWEL OR 17X24 6PK STRL BLUE (TOWEL DISPOSABLE) ×3 IMPLANT
TOWEL OR 17X26 10 PK STRL BLUE (TOWEL DISPOSABLE) ×3 IMPLANT
TUBE ANAEROBIC SPECIMEN COL (MISCELLANEOUS) IMPLANT
UNDERPAD 30X30 INCONTINENT (UNDERPADS AND DIAPERS) ×3 IMPLANT

## 2014-03-31 NOTE — H&P (Signed)
Kelly [name] is an 68 y.o. female.   Chief complaint: left thigh ulcer HPI: The patient is a 67 yrs old bf here for treatment of her left thigh ulcer.  She underwent a panniculectomy type procedure and was sent to a nursing facility.  She developed a DVT and anticoagulaton was started.  She bleed into the wound and had to be taken to the OR for I and D.  The area was debrided and Acell with the [facility] was placed.  She has been taken to the OR for a VAC change and Acell placement several times and presents today for another VAC change with placement of Acell. She has a greenfield filter in place.  Past Medical History  Diagnosis Date  . Hypertension   . Hyperlipidemia   . CHF (congestive heart failure)   . GERD (gastroesophageal reflux disease)   . Depression   . Sleep apnea     doesn't use cpap machine or 02 at night  . Headache(784.0)     Migraines  . Arthritis     knees  . Anemia     Had blood transfusion x 2 in yhe 90s  . Lymphedema of leg   . Pulmonary embolism 02/18/14    diagnosed at [facility] with CT angio chest  . Chronic indwelling Foley catheter   . Diabetes mellitus     not on medications    Past Surgical History  Procedure Laterality Date  . Rotator cuff repair      right  . Knee arthroscopy      right  . Temple biopsy    . Colonoscopy with propofol  09/23/2012    Procedure: COLONOSCOPY WITH PROPOFOL;  Surgeon: Jerene Bears, MD;  Location: [facility];  Service: Gastroenterology;  Laterality: N/A;  . Paniculectomy with wound debridement Left 02/08/2014    left medial thigh  . Irrigation and debridement abscess Left 02/24/2014    Procedure: MINOR INCISION AND DRAINAGE OF ABSCESS;  Surgeon: Theodoro Kos, DO;  Location: [facility];  Service: Plastics;  Laterality: Left;  . I&d extremity Left 03/03/2014    Procedure: IRRIGATION AND DEBRIDEMENT LEFT LEG WOUND AND PLACEMENT OF A CELL AND VAC;  Surgeon: Theodoro Kos, DO;  Location: [place];  Service: Plastics;  Laterality: Left;  .  Incision and drainage of wound Left 03/16/2014    Procedure: IRRIGATION AND DEBRIDEMENT OF LEFT THIGH WOUND, APPLICATION ACELL;  Surgeon: Irene Limbo, MD;  Location: [facility];  Service: Plastics;  Laterality: Left;  . Incision and drainage of wound Left 03/25/2014    Procedure: IRRIGATION AND DEBRIDEMENT OF LEFT LEG WOUND WITH PLACEMENT OF ACELL/VAC;  Surgeon: Theodoro Kos, DO;  Location: [facility];  Service: Plastics;  Laterality: Left;    Family History  Problem Relation Age of Onset  . Emphysema Father   . Asthma Brother   . Heart disease Mother   . Stomach cancer Brother   . Heart disease Maternal Grandmother   . Diabetes Mother   . Diabetes Sister    Social History:  reports that she quit smoking about 32 years ago. Her smoking use included Cigarettes. She smoked 0.00 packs per day. She has never used smokeless tobacco. She reports that she does not drink alcohol or use illicit drugs.  Allergies:  Allergies  Allergen Reactions  . Bactrim [Sulfamethoxazole-Tmp Ds] Other (See Comments)    Hyperkalemia (July 2015)  . Penicillins Hives    Medications Prior to Admission  Medication Sig Dispense Refill  .  allopurinol (ZYLOPRIM) 100 MG tablet Take 100 mg by mouth daily.       Marland Kitchen aspirin EC 325 MG tablet Take 325 mg by mouth daily.      Marland Kitchen atorvastatin (LIPITOR) 40 MG tablet Take 40 mg by mouth daily.       . chlorthalidone (HYGROTON) 25 MG tablet Take 12.5 mg by mouth daily.      Marland Kitchen doxycycline (VIBRAMYCIN) 100 MG capsule Take 100 mg by mouth 2 (two) times daily. For 7 days      . esomeprazole (NEXIUM) 40 MG capsule Take 40 mg by mouth daily before breakfast. Take on an empty stomach      . gabapentin (NEURONTIN) 300 MG capsule Take 300 mg by mouth 3 (three) times daily.      . isosorbide mononitrate (ISMO,MONOKET) 10 MG tablet Take 10 mg by mouth 2 (two) times daily.      . magnesium oxide (MAG-OX) 400 MG tablet Take 400 mg by mouth daily.       . metoprolol tartrate (LOPRESSOR) 25 MG  tablet Take 37.5 mg by mouth 2 (two) times daily.       . Oxycodone HCl 10 MG TABS Take 10 mg by mouth every 4 (four) hours as needed (for pain).      . polyethylene glycol (MIRALAX / GLYCOLAX) packet Take 17 g by mouth daily. Mix with 4-6 oz liquid      . promethazine (PHENERGAN) 25 MG tablet Take 25 mg by mouth 2 (two) times daily as needed for nausea or vomiting.      Marland Kitchen zolpidem (AMBIEN CR) 6.25 MG CR tablet Take 6.25 mg by mouth at bedtime as needed for sleep.        Results for orders placed during the hospital encounter of 03/31/14 (from the past 48 hour(s))  GLUCOSE, CAPILLARY     Status: Abnormal   Collection Time    03/31/14 12:51 PM      Result Value Ref Range   Glucose-Capillary 132 (*) 70 - 99 mg/dL  BASIC METABOLIC PANEL     Status: Abnormal   Collection Time    03/31/14  1:01 PM      Result Value Ref Range   Sodium 139  137 - 147 mEq/L   Potassium 4.7  3.7 - 5.3 mEq/L   Chloride 106  96 - 112 mEq/L   CO2 21  19 - 32 mEq/L   Glucose, Bld 124 (*) 70 - 99 mg/dL   BUN 21  6 - 23 mg/dL   Creatinine, Ser 1.18 (*) 0.50 - 1.10 mg/dL   Calcium 8.4  8.4 - 10.5 mg/dL   GFR calc non Af Amer 47 (*) >90 mL/min   GFR calc Af Amer 54 (*) >90 mL/min   Comment: (NOTE)     The eGFR has been calculated using the CKD EPI equation.     This calculation has not been validated in all clinical situations.     eGFR's persistently <90 mL/min signify possible Chronic Kidney     Disease.   Anion gap 12  5 - 15  CBC     Status: Abnormal   Collection Time    03/31/14  1:01 PM      Result Value Ref Range   WBC 7.1  4.0 - 10.5 K/uL   RBC 3.21 (*) 3.87 - 5.11 MIL/uL   Hemoglobin 9.4 (*) 12.0 - 15.0 g/dL   HCT 29.8 (*) 36.0 - 46.0 %   MCV  92.8  78.0 - 100.0 fL   MCH 29.3  26.0 - 34.0 pg   MCHC 31.5  30.0 - 36.0 g/dL   RDW 16.0 (*) 11.5 - 15.5 %   Platelets 208  150 - 400 K/uL   No results found.  ROS  Blood pressure 146/46, pulse 61, temperature 97.9 F (36.6 C), temperature source  Oral, resp. rate 18, height _0  (1.626 m), weight 146.512 kg (323 lb), SpO2 100.00%. Physical Exam   Assessment/Plan Irrigation and debridement of left hip ulcer with Acell and VAC placement.  SANGER,Mekhi Sonn 03/31/2014, 3:22 PM

## 2014-03-31 NOTE — Anesthesia Preprocedure Evaluation (Addendum)
Anesthesia Evaluation  Patient identified by MRN, date of birth, ID band Patient awake    Reviewed: Allergy & Precautions, H&P , NPO status , Patient's Chart, lab work & pertinent test results  Airway Mallampati: I TM Distance: >3 FB Neck ROM: Full    Dental   Pulmonary sleep apnea , former smoker,          Cardiovascular hypertension, Pt. on medications     Neuro/Psych    GI/Hepatic   Endo/Other  diabetes, Type 2, Oral Hypoglycemic Agents  Renal/GU CRFRenal disease     Musculoskeletal   Abdominal   Peds  Hematology   Anesthesia Other Findings   Reproductive/Obstetrics                          Anesthesia Physical Anesthesia Plan  ASA: III  Anesthesia Plan: General   Post-op Pain Management:    Induction: Intravenous  Airway Management Planned: LMA  Additional Equipment:   Intra-op Plan:   Post-operative Plan: Extubation in OR  Informed Consent: I have reviewed the patients History and Physical, chart, labs and discussed the procedure including the risks, benefits and alternatives for the proposed anesthesia with the patient or authorized representative who has indicated his/her understanding and acceptance.     Plan Discussed with: CRNA and Surgeon  Anesthesia Plan Comments:         Anesthesia Quick Evaluation

## 2014-03-31 NOTE — Anesthesia Postprocedure Evaluation (Signed)
  Anesthesia Post-op Note  Patient: Kelly [name]  Procedure(s) Performed: Procedure(s): IRRIGATION AND DEBRIDEMENT LEFT LEG WOUND WITH PLACEMENT OF A-CELL/VAC (Left)  Patient Location: PACU  Anesthesia Type:General  Level of Consciousness: awake and sedated  Airway and Oxygen Therapy: Patient Spontanous Breathing  Post-op Pain: mild  Post-op Assessment: Post-op Vital signs reviewed  Post-op Vital Signs: stable  Last Vitals:  Filed Vitals:   03/31/14 1638  BP: 150/57  Pulse: 65  Temp: 36.9 C  Resp: 16    Complications: No apparent anesthesia complications

## 2014-03-31 NOTE — Progress Notes (Signed)
Report called to [place], RN @ [address]  Non-emergent transportation service contacted; waiting for their arrival. Dentures and belongings returned to patient Pt was ordered a carb-modified diet tray; sitting up in stretcher to eat.

## 2014-03-31 NOTE — Op Note (Signed)
Operative Note   DATE OF OPERATION: 03/31/2014  LOCATION: [facility] OR outpatient  SURGICAL DIVISION: Plastic Surgery  PREOPERATIVE DIAGNOSES:  Large left leg wound  POSTOPERATIVE DIAGNOSES:  same  PROCEDURE:  Preparation of wound bed for placement of Acell (1 gm powder and 7 x 10 cm) and VAC placement (14 x 4 x 3 cm)  SURGEON: Thana Ramp Sanger, DO  ANESTHESIA:  General.   COMPLICATIONS: None.   INDICATIONS FOR PROCEDURE:  The patient, Kelly [name] is a 68 y.o. female born on 15-Jan-1946, is here for treatment of left leg wound. MRN: [id number]  CONSENT:  Informed consent was obtained directly from the patient. Risks, benefits and alternatives were fully discussed. Specific risks including but not limited to bleeding, infection, hematoma, seroma, scarring, pain, infection, contracture, asymmetry, wound healing problems, and need for further surgery were all discussed. The patient did have an ample opportunity to have questions answered to satisfaction.   DESCRIPTION OF PROCEDURE:  The patient was taken to the operating room. The patient's operative site was prepped and draped in a sterile fashion. A time out was performed and all information was confirmed to be correct.  Sedation was administered.  The area was irrigated.   Hemostasis was achieved with pressure.  The Acell powder was placed followed by the Acell sheet.  The adaptic was applied and the VAC.  There was an excellent seal.  The patient tolerated the procedure well.  There were no complications. The patient was allowed to wake and taken to the recovery room in satisfactory condition.

## 2014-03-31 NOTE — Anesthesia Procedure Notes (Signed)
Procedure Name: Intubation Date/Time: 03/31/2014 4:06 PM Performed by: Julian Reil Pre-anesthesia Checklist: Patient identified, Emergency Drugs available, Suction available and Patient being monitored Patient Re-evaluated:Patient Re-evaluated prior to inductionOxygen Delivery Method: Circle system utilized Preoxygenation: Pre-oxygenation with 100% oxygen Intubation Type: IV induction Ventilation: Mask ventilation without difficulty Laryngoscope Size: Mac and 3 Grade View: Grade I Tube type: Oral Tube size: 7.5 mm Number of attempts: 1 Airway Equipment and Method: Stylet Placement Confirmation: ETT inserted through vocal cords under direct vision,  positive ETCO2 and breath sounds checked- equal and bilateral Secured at: 21 cm Tube secured with: Tape Dental Injury: Teeth and Oropharynx as per pre-operative assessment

## 2014-03-31 NOTE — Brief Op Note (Signed)
03/31/2014  4:29 PM  PATIENT:  Kelly [name]  68 y.o. female  PRE-OPERATIVE DIAGNOSIS:  OPEN WOUND OF LEFT LEG  POST-OPERATIVE DIAGNOSIS:  * No post-op diagnosis entered *  PROCEDURE:  Procedure(s): IRRIGATION AND DEBRIDEMENT LEFT LEG WOUND WITH PLACEMENT OF A-CELL/VAC (Left)  SURGEON:  Surgeon(s) and Role:    * Onyx Schirmer Sanger, DO - Primary  PHYSICIAN ASSISTANT: none  ASSISTANTS: none   ANESTHESIA:   general  EBL:  Total I/O In: 300 [I.V.:300] Out: -   BLOOD ADMINISTERED:none  DRAINS: none   LOCAL MEDICATIONS USED:  NONE  SPECIMEN:  No Specimen  DISPOSITION OF SPECIMEN:  N/A  COUNTS:  YES  TOURNIQUET:  * No tourniquets in log *  DICTATION: .Dragon Dictation  PLAN OF CARE: Discharge to home after PACU  PATIENT DISPOSITION:  PACU - hemodynamically stable.   Delay start of Pharmacological VTE agent (>24hrs) due to surgical blood loss or risk of bleeding: no

## 2014-03-31 NOTE — Transfer of Care (Signed)
Immediate Anesthesia Transfer of Care Note  Patient: Kelly [name]  Procedure(s) Performed: Procedure(s): IRRIGATION AND DEBRIDEMENT LEFT LEG WOUND WITH PLACEMENT OF A-CELL/VAC (Left)  Patient Location: PACU  Anesthesia Type:General  Level of Consciousness: awake, alert , oriented and patient cooperative  Airway & Oxygen Therapy: Patient Spontanous Breathing and Patient connected to face mask oxygen  Post-op Assessment: Report given to PACU RN, Post -op Vital signs reviewed and stable and Patient moving all extremities  Post vital signs: Reviewed and stable  Complications: No apparent anesthesia complications

## 2014-04-01 ENCOUNTER — Encounter (HOSPITAL_COMMUNITY): Payer: Self-pay | Admitting: Plastic Surgery

## 2014-04-01 ENCOUNTER — Non-Acute Institutional Stay (SKILLED_NURSING_FACILITY): Payer: Medicare Other | Admitting: Internal Medicine

## 2014-04-01 DIAGNOSIS — G909 Disorder of the autonomic nervous system, unspecified: Secondary | ICD-10-CM

## 2014-04-01 DIAGNOSIS — E0843 Diabetes mellitus due to underlying condition with diabetic autonomic (poly)neuropathy: Secondary | ICD-10-CM

## 2014-04-01 DIAGNOSIS — I5032 Chronic diastolic (congestive) heart failure: Secondary | ICD-10-CM

## 2014-04-01 DIAGNOSIS — E1349 Other specified diabetes mellitus with other diabetic neurological complication: Secondary | ICD-10-CM

## 2014-04-01 DIAGNOSIS — I2510 Atherosclerotic heart disease of native coronary artery without angina pectoris: Secondary | ICD-10-CM

## 2014-04-01 DIAGNOSIS — K5900 Constipation, unspecified: Secondary | ICD-10-CM

## 2014-04-02 DIAGNOSIS — I2510 Atherosclerotic heart disease of native coronary artery without angina pectoris: Secondary | ICD-10-CM | POA: Insufficient documentation

## 2014-04-02 DIAGNOSIS — K5900 Constipation, unspecified: Secondary | ICD-10-CM | POA: Insufficient documentation

## 2014-04-02 NOTE — Progress Notes (Signed)
         PROGRESS NOTE  DATE: 04/01/2014  FACILITY: Nursing Home Location: [facility]  LEVEL OF CARE: SNF (31)  Routine Visit  CHIEF COMPLAINT:  Manage constipation, neuropathy and CHF  HISTORY OF PRESENT ILLNESS:  REASSESSMENT OF ONGOING PROBLEM(S):  CHF:The patient does not relate significant weight changes, denies sob, DOE, orthopnea, PNDs, palpitations or chest pain.  CHF remains stable.  No complications form the medications being used. Complains of chronic lower extremity swelling.  CONSTIPATION: The constipation is unstable. No complications from the medications presently being used. Patient complains of ongoing constipation, but denies abdominal pain, nausea or vomiting.  PERIPHERAL NEUROPATHY: The peripheral neuropathy is stable. The patient denies pain in the feet, tingling, and numbness. No complications noted from the medication presently being used.  PAST MEDICAL HISTORY : Reviewed.  No changes/see problem list  CURRENT MEDICATIONS: Reviewed per MAR/see medication list  REVIEW OF SYSTEMS:  GENERAL: no change in appetite, no fatigue, no weight changes, no fever, chills or weakness RESPIRATORY: no cough, SOB, DOE, wheezing, hemoptysis CARDIAC: no chest pain,  or palpitations, complains of chronic lower extremity swelling GI: no abdominal pain, diarrhea, constipation, heart burn, nausea or vomiting  PHYSICAL EXAMINATION  VS:  See VS section  GENERAL: no acute distress, morbidly obese body habitus EYES: conjunctivae normal, sclerae normal, normal eye lids NECK: supple, trachea midline, no neck masses, no thyroid tenderness, no thyromegaly LYMPHATICS: no LAN in the neck, no supraclavicular LAN RESPIRATORY: breathing is even & unlabored, BS CTAB CARDIAC: RRR, no murmur,no extra heart sounds, +4 bilateral lower extremity edema GI: abdomen soft, normal BS, no masses, no tenderness, no hepatomegaly, no splenomegaly PSYCHIATRIC: the patient is  alert & oriented to person, affect & behavior appropriate  LABS/RADIOLOGY:  7-15 hemoglobin 8.2, MCV 91 otherwise CBC normal, BMP normal  ASSESSMENT/PLAN:  Constipation-uncontrolled. Increase MiraLax to twice a day. Neuropathy-continue Neurontin CHF-compensated CAD-stable GERD-continue Nexium Gout-continue allopurinol Hyperlipidemia-check fasting lipid panel Hypomagnesemia-continue supplementation. Check magnesium level. Diabetes mellitus-check hemoglobin A1c. Diet controlled Left lower extremity wound-status post debridement. Continue wound care. Check a liver profile  CPT CODE: 63846  Gricelda Foland Y Gemma Ruan, MD [facility] [phone]

## 2014-04-06 ENCOUNTER — Encounter (HOSPITAL_COMMUNITY): Payer: Self-pay | Admitting: *Deleted

## 2014-04-06 NOTE — Progress Notes (Signed)
Pt is resident of [facility]. Called and spoke with Drucilla Chalet, nurse for Ms. Budlong. He verified allergies and medical history. States he has faxed a copy of MAR to the pharmacy today. Pre-op instructions given - NPO after MN tonight, meds to take in AM, time of arrival to Short Stay. Pt will be arriving via EMS. Pt is alert and oriented per nurse and he states she is able to speak/sign for herself.

## 2014-04-07 ENCOUNTER — Encounter (HOSPITAL_COMMUNITY): Payer: Self-pay | Admitting: Plastic Surgery

## 2014-04-07 ENCOUNTER — Inpatient Hospital Stay (HOSPITAL_COMMUNITY)
Admission: RE | Admit: 2014-04-07 | Discharge: 2014-04-09 | DRG: 593 | Disposition: A | Discharge status: Skilled Nursing Facility | Payer: Medicare Other | Source: Ambulatory Visit | Attending: Internal Medicine | Admitting: Internal Medicine

## 2014-04-07 ENCOUNTER — Encounter (HOSPITAL_COMMUNITY): Payer: Medicare Other | Admitting: Anesthesiology

## 2014-04-07 ENCOUNTER — Inpatient Hospital Stay (HOSPITAL_COMMUNITY): Payer: Medicare Other

## 2014-04-07 ENCOUNTER — Encounter (HOSPITAL_COMMUNITY)
Admission: RE | Disposition: A | Discharge status: Skilled Nursing Facility | Payer: Self-pay | Source: Ambulatory Visit | Attending: Internal Medicine

## 2014-04-07 ENCOUNTER — Ambulatory Visit (HOSPITAL_COMMUNITY): Payer: Medicare Other | Admitting: Anesthesiology

## 2014-04-07 DIAGNOSIS — A419 Sepsis, unspecified organism: Secondary | ICD-10-CM

## 2014-04-07 DIAGNOSIS — E1142 Type 2 diabetes mellitus with diabetic polyneuropathy: Secondary | ICD-10-CM | POA: Diagnosis present

## 2014-04-07 DIAGNOSIS — I509 Heart failure, unspecified: Secondary | ICD-10-CM | POA: Diagnosis present

## 2014-04-07 DIAGNOSIS — R000 Tachycardia, unspecified: Secondary | ICD-10-CM

## 2014-04-07 DIAGNOSIS — Z79899 Other long term (current) drug therapy: Secondary | ICD-10-CM

## 2014-04-07 DIAGNOSIS — Z880 Allergy status to penicillin: Secondary | ICD-10-CM

## 2014-04-07 DIAGNOSIS — M79609 Pain in unspecified limb: Secondary | ICD-10-CM

## 2014-04-07 DIAGNOSIS — N183 Chronic kidney disease, stage 3 unspecified: Secondary | ICD-10-CM

## 2014-04-07 DIAGNOSIS — E875 Hyperkalemia: Secondary | ICD-10-CM

## 2014-04-07 DIAGNOSIS — D72829 Elevated white blood cell count, unspecified: Secondary | ICD-10-CM

## 2014-04-07 DIAGNOSIS — I5032 Chronic diastolic (congestive) heart failure: Secondary | ICD-10-CM

## 2014-04-07 DIAGNOSIS — E1149 Type 2 diabetes mellitus with other diabetic neurological complication: Secondary | ICD-10-CM | POA: Diagnosis present

## 2014-04-07 DIAGNOSIS — D638 Anemia in other chronic diseases classified elsewhere: Secondary | ICD-10-CM

## 2014-04-07 DIAGNOSIS — N390 Urinary tract infection, site not specified: Secondary | ICD-10-CM

## 2014-04-07 DIAGNOSIS — I129 Hypertensive chronic kidney disease with stage 1 through stage 4 chronic kidney disease, or unspecified chronic kidney disease: Secondary | ICD-10-CM | POA: Diagnosis present

## 2014-04-07 DIAGNOSIS — L97109 Non-pressure chronic ulcer of unspecified thigh with unspecified severity: Secondary | ICD-10-CM | POA: Diagnosis present

## 2014-04-07 DIAGNOSIS — Z86711 Personal history of pulmonary embolism: Secondary | ICD-10-CM | POA: Diagnosis not present

## 2014-04-07 DIAGNOSIS — G4733 Obstructive sleep apnea (adult) (pediatric): Secondary | ICD-10-CM | POA: Diagnosis present

## 2014-04-07 DIAGNOSIS — I872 Venous insufficiency (chronic) (peripheral): Secondary | ICD-10-CM

## 2014-04-07 DIAGNOSIS — M79605 Pain in left leg: Secondary | ICD-10-CM

## 2014-04-07 DIAGNOSIS — E1129 Type 2 diabetes mellitus with other diabetic kidney complication: Secondary | ICD-10-CM

## 2014-04-07 DIAGNOSIS — I890 Lymphedema, not elsewhere classified: Secondary | ICD-10-CM | POA: Diagnosis present

## 2014-04-07 DIAGNOSIS — I959 Hypotension, unspecified: Secondary | ICD-10-CM

## 2014-04-07 DIAGNOSIS — R609 Edema, unspecified: Secondary | ICD-10-CM

## 2014-04-07 DIAGNOSIS — M109 Gout, unspecified: Secondary | ICD-10-CM | POA: Diagnosis present

## 2014-04-07 DIAGNOSIS — D62 Acute posthemorrhagic anemia: Secondary | ICD-10-CM

## 2014-04-07 DIAGNOSIS — I10 Essential (primary) hypertension: Secondary | ICD-10-CM

## 2014-04-07 DIAGNOSIS — Z7982 Long term (current) use of aspirin: Secondary | ICD-10-CM

## 2014-04-07 DIAGNOSIS — Z87891 Personal history of nicotine dependence: Secondary | ICD-10-CM

## 2014-04-07 DIAGNOSIS — L97909 Non-pressure chronic ulcer of unspecified part of unspecified lower leg with unspecified severity: Secondary | ICD-10-CM

## 2014-04-07 DIAGNOSIS — I2782 Chronic pulmonary embolism: Secondary | ICD-10-CM | POA: Diagnosis present

## 2014-04-07 DIAGNOSIS — K219 Gastro-esophageal reflux disease without esophagitis: Secondary | ICD-10-CM | POA: Diagnosis present

## 2014-04-07 DIAGNOSIS — E785 Hyperlipidemia, unspecified: Secondary | ICD-10-CM | POA: Diagnosis present

## 2014-04-07 DIAGNOSIS — R6520 Severe sepsis without septic shock: Secondary | ICD-10-CM

## 2014-04-07 DIAGNOSIS — N189 Chronic kidney disease, unspecified: Secondary | ICD-10-CM

## 2014-04-07 DIAGNOSIS — D631 Anemia in chronic kidney disease: Secondary | ICD-10-CM

## 2014-04-07 DIAGNOSIS — T83511A Infection and inflammatory reaction due to indwelling urethral catheter, initial encounter: Secondary | ICD-10-CM

## 2014-04-07 DIAGNOSIS — E662 Morbid (severe) obesity with alveolar hypoventilation: Secondary | ICD-10-CM

## 2014-04-07 DIAGNOSIS — I2699 Other pulmonary embolism without acute cor pulmonale: Secondary | ICD-10-CM

## 2014-04-07 DIAGNOSIS — N179 Acute kidney failure, unspecified: Secondary | ICD-10-CM

## 2014-04-07 DIAGNOSIS — K5900 Constipation, unspecified: Secondary | ICD-10-CM

## 2014-04-07 HISTORY — DX: Gout, unspecified: M10.9

## 2014-04-07 HISTORY — PX: INCISION AND DRAINAGE OF WOUND: SHX1803

## 2014-04-07 HISTORY — DX: Migraine, unspecified, not intractable, without status migrainosus: G43.909

## 2014-04-07 HISTORY — DX: Acute embolism and thrombosis of unspecified deep veins of unspecified lower extremity: I82.409

## 2014-04-07 HISTORY — DX: Unspecified urinary incontinence: R32

## 2014-04-07 HISTORY — DX: Cardiac murmur, unspecified: R01.1

## 2014-04-07 HISTORY — DX: Chronic kidney disease, stage 3 unspecified: N18.30

## 2014-04-07 HISTORY — DX: Personal history of other medical treatment: Z92.89

## 2014-04-07 HISTORY — DX: Pneumonia, unspecified organism: J18.9

## 2014-04-07 HISTORY — DX: Chronic kidney disease, stage 3 (moderate): N18.3

## 2014-04-07 LAB — CBC
HCT: 30 % — ABNORMAL LOW (ref 36.0–46.0)
Hemoglobin: 9.4 g/dL — ABNORMAL LOW (ref 12.0–15.0)
MCH: 28.5 pg (ref 26.0–34.0)
MCHC: 31.3 g/dL (ref 30.0–36.0)
MCV: 90.9 fL (ref 78.0–100.0)
PLATELETS: 215 10*3/uL (ref 150–400)
RBC: 3.3 MIL/uL — ABNORMAL LOW (ref 3.87–5.11)
RDW: 15.9 % — AB (ref 11.5–15.5)
WBC: 5.8 10*3/uL (ref 4.0–10.5)

## 2014-04-07 LAB — BASIC METABOLIC PANEL
Anion gap: 11 (ref 5–15)
BUN: 20 mg/dL (ref 6–23)
CALCIUM: 8.5 mg/dL (ref 8.4–10.5)
CO2: 23 mEq/L (ref 19–32)
Chloride: 109 mEq/L (ref 96–112)
Creatinine, Ser: 1.07 mg/dL (ref 0.50–1.10)
GFR calc Af Amer: 61 mL/min — ABNORMAL LOW (ref >90)
GFR, EST NON AFRICAN AMERICAN: 52 mL/min — AB (ref >90)
Glucose, Bld: 157 mg/dL — ABNORMAL HIGH (ref 70–99)
Potassium: 5.3 mEq/L (ref 3.7–5.3)
SODIUM: 143 meq/L (ref 137–147)

## 2014-04-07 LAB — PROTIME-INR
INR: 1.27 (ref 0.00–1.49)
Prothrombin Time: 15.9 seconds — ABNORMAL HIGH (ref 11.6–15.2)

## 2014-04-07 LAB — GLUCOSE, CAPILLARY
Glucose-Capillary: 124 mg/dL — ABNORMAL HIGH (ref 70–99)
Glucose-Capillary: 164 mg/dL — ABNORMAL HIGH (ref 70–99)

## 2014-04-07 SURGERY — IRRIGATION AND DEBRIDEMENT WOUND
Anesthesia: General | Site: Thigh | Laterality: Left

## 2014-04-07 MED ORDER — FENTANYL CITRATE 0.05 MG/ML IJ SOLN
INTRAMUSCULAR | Status: AC
  Filled 2014-04-07: qty 5

## 2014-04-07 MED ORDER — PHENYLEPHRINE HCL 10 MG/ML IJ SOLN
INTRAMUSCULAR | Status: DC | PRN
  Administered 2014-04-07 (×2): 80 ug via INTRAVENOUS

## 2014-04-07 MED ORDER — EPHEDRINE SULFATE 50 MG/ML IJ SOLN
INTRAMUSCULAR | Status: DC | PRN
  Administered 2014-04-07: 10 mg via INTRAVENOUS

## 2014-04-07 MED ORDER — ONDANSETRON HCL 4 MG PO TABS: 4.0000 mg | ORAL_TABLET | Freq: Four times a day (QID) | ORAL | Status: DC | PRN

## 2014-04-07 MED ORDER — LIDOCAINE HCL (CARDIAC) 20 MG/ML IV SOLN
INTRAVENOUS | Status: DC | PRN
  Administered 2014-04-07: 80 mg via INTRAVENOUS

## 2014-04-07 MED ORDER — HYDROMORPHONE HCL PF 1 MG/ML IJ SOLN
0.2500 mg | INTRAMUSCULAR | Status: DC | PRN
  Administered 2014-04-07: 0.5 mg via INTRAVENOUS
  Administered 2014-04-07: 15:00:00 via INTRAVENOUS

## 2014-04-07 MED ORDER — ONDANSETRON HCL 4 MG/2ML IJ SOLN: 4.0000 mg | Freq: Four times a day (QID) | INTRAMUSCULAR | Status: DC | PRN

## 2014-04-07 MED ORDER — CHLORTHALIDONE 25 MG PO TABS
12.5000 mg | ORAL_TABLET | Freq: Every day | ORAL | Status: DC
  Administered 2014-04-07 – 2014-04-09 (×3): 12.5 mg via ORAL
  Filled 2014-04-07 (×3): qty 0.5

## 2014-04-07 MED ORDER — ENOXAPARIN SODIUM 150 MG/ML ~~LOC~~ SOLN
140.0000 mg | Freq: Two times a day (BID) | SUBCUTANEOUS | Status: DC
  Administered 2014-04-07 – 2014-04-09 (×4): 140 mg via SUBCUTANEOUS
  Filled 2014-04-07 (×6): qty 1

## 2014-04-07 MED ORDER — CEFAZOLIN SODIUM-DEXTROSE 2-3 GM-% IV SOLR
INTRAVENOUS | Status: DC | PRN
  Administered 2014-04-07: 2 g via INTRAVENOUS

## 2014-04-07 MED ORDER — INSULIN ASPART 100 UNIT/ML ~~LOC~~ SOLN
0.0000 [IU] | Freq: Three times a day (TID) | SUBCUTANEOUS | Status: DC
  Administered 2014-04-08 – 2014-04-09 (×4): 1 [IU] via SUBCUTANEOUS

## 2014-04-07 MED ORDER — SUCCINYLCHOLINE CHLORIDE 20 MG/ML IJ SOLN
INTRAMUSCULAR | Status: DC | PRN
  Administered 2014-04-07: 100 mg via INTRAVENOUS

## 2014-04-07 MED ORDER — ACETAMINOPHEN 650 MG RE SUPP: 650.0000 mg | Freq: Four times a day (QID) | RECTAL | Status: DC | PRN

## 2014-04-07 MED ORDER — MIDAZOLAM HCL 2 MG/2ML IJ SOLN
INTRAMUSCULAR | Status: AC
  Filled 2014-04-07: qty 2

## 2014-04-07 MED ORDER — ASPIRIN EC 81 MG PO TBEC
81.0000 mg | DELAYED_RELEASE_TABLET | Freq: Every day | ORAL | Status: DC
  Administered 2014-04-07 – 2014-04-09 (×3): 81 mg via ORAL
  Filled 2014-04-07 (×3): qty 1

## 2014-04-07 MED ORDER — ACETAMINOPHEN 325 MG PO TABS: 650.0000 mg | ORAL_TABLET | Freq: Four times a day (QID) | ORAL | Status: DC | PRN

## 2014-04-07 MED ORDER — METHYL SALICYLATE EX LIQD
CUTANEOUS | Status: AC
  Filled 2014-04-07: qty 60

## 2014-04-07 MED ORDER — MIDAZOLAM HCL 5 MG/5ML IJ SOLN
INTRAMUSCULAR | Status: DC | PRN
  Administered 2014-04-07: 2 mg via INTRAVENOUS

## 2014-04-07 MED ORDER — PROPOFOL 10 MG/ML IV BOLUS
INTRAVENOUS | Status: DC | PRN
  Administered 2014-04-07: 160 mg via INTRAVENOUS

## 2014-04-07 MED ORDER — ONDANSETRON HCL 4 MG/2ML IJ SOLN
4.0000 mg | Freq: Once | INTRAMUSCULAR | Status: AC | PRN
  Administered 2014-04-07: 4 mg via INTRAVENOUS

## 2014-04-07 MED ORDER — ALLOPURINOL 100 MG PO TABS
100.0000 mg | ORAL_TABLET | Freq: Every day | ORAL | Status: DC
  Administered 2014-04-08 – 2014-04-09 (×2): 100 mg via ORAL
  Filled 2014-04-07 (×2): qty 1

## 2014-04-07 MED ORDER — PROPOFOL 10 MG/ML IV BOLUS
INTRAVENOUS | Status: AC
  Filled 2014-04-07: qty 20

## 2014-04-07 MED ORDER — ISOSORBIDE MONONITRATE 10 MG PO TABS
10.0000 mg | ORAL_TABLET | Freq: Two times a day (BID) | ORAL | Status: DC
  Administered 2014-04-07 – 2014-04-09 (×4): 10 mg via ORAL
  Filled 2014-04-07 (×5): qty 1

## 2014-04-07 MED ORDER — SODIUM CHLORIDE 0.9 % IR SOLN
Status: DC | PRN
  Administered 2014-04-07: 14:00:00

## 2014-04-07 MED ORDER — OXYCODONE HCL 5 MG PO TABS
10.0000 mg | ORAL_TABLET | ORAL | Status: DC | PRN
  Administered 2014-04-08 – 2014-04-09 (×2): 10 mg via ORAL
  Filled 2014-04-07 (×2): qty 2

## 2014-04-07 MED ORDER — POLYETHYLENE GLYCOL 3350 17 G PO PACK
17.0000 g | PACK | Freq: Two times a day (BID) | ORAL | Status: DC
  Administered 2014-04-07 – 2014-04-09 (×4): 17 g via ORAL
  Filled 2014-04-07 (×5): qty 1

## 2014-04-07 MED ORDER — LACTATED RINGERS IV SOLN
INTRAVENOUS | Status: DC
  Administered 2014-04-07: 12:00:00 via INTRAVENOUS

## 2014-04-07 MED ORDER — ATORVASTATIN CALCIUM 40 MG PO TABS
40.0000 mg | ORAL_TABLET | Freq: Every day | ORAL | Status: DC
  Administered 2014-04-07 – 2014-04-08 (×2): 40 mg via ORAL
  Filled 2014-04-07 (×3): qty 1

## 2014-04-07 MED ORDER — ONDANSETRON HCL 4 MG/2ML IJ SOLN
INTRAMUSCULAR | Status: AC
  Filled 2014-04-07: qty 2

## 2014-04-07 MED ORDER — ONDANSETRON HCL 4 MG/2ML IJ SOLN
INTRAMUSCULAR | Status: DC | PRN
  Administered 2014-04-07: 4 mg via INTRAVENOUS

## 2014-04-07 MED ORDER — PNEUMOCOCCAL VAC POLYVALENT 25 MCG/0.5ML IJ INJ
0.5000 mL | INJECTION | INTRAMUSCULAR | Status: AC
  Administered 2014-04-08: 0.5 mL via INTRAMUSCULAR
  Filled 2014-04-07: qty 0.5

## 2014-04-07 MED ORDER — COLCHICINE 0.6 MG PO TABS
0.6000 mg | ORAL_TABLET | Freq: Every day | ORAL | Status: DC
  Administered 2014-04-07 – 2014-04-09 (×3): 0.6 mg via ORAL
  Filled 2014-04-07 (×3): qty 1

## 2014-04-07 MED ORDER — SODIUM CHLORIDE 0.9 % IR SOLN
Status: DC | PRN
  Administered 2014-04-07: 1000 mL

## 2014-04-07 MED ORDER — PANTOPRAZOLE SODIUM 40 MG PO TBEC
80.0000 mg | DELAYED_RELEASE_TABLET | Freq: Every day | ORAL | Status: DC
  Administered 2014-04-08 – 2014-04-09 (×2): 80 mg via ORAL
  Filled 2014-04-07 (×2): qty 2

## 2014-04-07 MED ORDER — LACTATED RINGERS IV SOLN
INTRAVENOUS | Status: DC | PRN
  Administered 2014-04-07: 13:00:00 via INTRAVENOUS

## 2014-04-07 MED ORDER — HYDROMORPHONE HCL PF 1 MG/ML IJ SOLN
INTRAMUSCULAR | Status: AC
  Filled 2014-04-07: qty 1

## 2014-04-07 MED ORDER — METOPROLOL TARTRATE 25 MG PO TABS
37.5000 mg | ORAL_TABLET | Freq: Two times a day (BID) | ORAL | Status: DC
  Administered 2014-04-07 – 2014-04-09 (×4): 37.5 mg via ORAL
  Filled 2014-04-07 (×5): qty 1

## 2014-04-07 MED ORDER — GABAPENTIN 300 MG PO CAPS
600.0000 mg | ORAL_CAPSULE | Freq: Two times a day (BID) | ORAL | Status: DC
  Administered 2014-04-07 – 2014-04-09 (×4): 600 mg via ORAL
  Filled 2014-04-07 (×5): qty 2

## 2014-04-07 MED ORDER — ZOLPIDEM TARTRATE 5 MG PO TABS
5.0000 mg | ORAL_TABLET | Freq: Every evening | ORAL | Status: DC | PRN
  Administered 2014-04-09: 5 mg via ORAL
  Filled 2014-04-07: qty 1

## 2014-04-07 MED ORDER — DOCUSATE SODIUM 100 MG PO CAPS
100.0000 mg | ORAL_CAPSULE | Freq: Two times a day (BID) | ORAL | Status: DC
  Administered 2014-04-07 – 2014-04-09 (×4): 100 mg via ORAL
  Filled 2014-04-07 (×5): qty 1

## 2014-04-07 MED ORDER — SENNA 8.6 MG PO TABS
1.0000 | ORAL_TABLET | Freq: Two times a day (BID) | ORAL | Status: DC
  Administered 2014-04-07 – 2014-04-09 (×4): 8.6 mg via ORAL
  Filled 2014-04-07 (×5): qty 1

## 2014-04-07 MED ORDER — FENTANYL CITRATE 0.05 MG/ML IJ SOLN
INTRAMUSCULAR | Status: DC | PRN
  Administered 2014-04-07: 100 ug via INTRAVENOUS

## 2014-04-07 SURGICAL SUPPLY — 38 items
BAG DECANTER FOR FLEXI CONT (MISCELLANEOUS) IMPLANT
BLADE 10 SAFETY STRL DISP (BLADE) ×3 IMPLANT
BLADE SURG ROTATE 9660 (MISCELLANEOUS) IMPLANT
BNDG GAUZE ELAST 4 BULKY (GAUZE/BANDAGES/DRESSINGS) IMPLANT
CANISTER SUCTION 2500CC (MISCELLANEOUS) ×3 IMPLANT
CHLORAPREP W/TINT 26ML (MISCELLANEOUS) IMPLANT
CONT SPEC STER OR (MISCELLANEOUS) IMPLANT
COVER SURGICAL LIGHT HANDLE (MISCELLANEOUS) ×3 IMPLANT
DRAPE INCISE IOBAN 66X45 STRL (DRAPES) IMPLANT
DRAPE PED LAPAROTOMY (DRAPES) ×3 IMPLANT
DRAPE PROXIMA HALF (DRAPES) IMPLANT
DRSG ADAPTIC 3X8 NADH LF (GAUZE/BANDAGES/DRESSINGS) ×2 IMPLANT
DRSG PAD ABDOMINAL 8X10 ST (GAUZE/BANDAGES/DRESSINGS) ×3 IMPLANT
DRSG VAC ATS LRG SENSATRAC (GAUZE/BANDAGES/DRESSINGS) IMPLANT
DRSG VAC ATS MED SENSATRAC (GAUZE/BANDAGES/DRESSINGS) ×2 IMPLANT
DRSG VAC ATS SM SENSATRAC (GAUZE/BANDAGES/DRESSINGS) IMPLANT
ELECT CAUTERY BLADE 6.4 (BLADE) ×3 IMPLANT
ELECT REM PT RETURN 9FT ADLT (ELECTROSURGICAL) ×3
ELECTRODE REM PT RTRN 9FT ADLT (ELECTROSURGICAL) ×1 IMPLANT
GAUZE SPONGE 4X4 12PLY STRL (GAUZE/BANDAGES/DRESSINGS) ×3 IMPLANT
GLOVE BIO SURGEON STRL SZ 6.5 (GLOVE) ×2 IMPLANT
GLOVE BIO SURGEONS STRL SZ 6.5 (GLOVE) ×1
GOWN STRL REUS W/ TWL LRG LVL3 (GOWN DISPOSABLE) ×2 IMPLANT
GOWN STRL REUS W/TWL LRG LVL3 (GOWN DISPOSABLE) ×6
KIT BASIN OR (CUSTOM PROCEDURE TRAY) ×3 IMPLANT
KIT ROOM TURNOVER OR (KITS) ×3 IMPLANT
MATRIX SURGICAL PSM 7X10CM (Tissue) ×4 IMPLANT
NS IRRIG 1000ML POUR BTL (IV SOLUTION) ×3 IMPLANT
PACK GENERAL/GYN (CUSTOM PROCEDURE TRAY) ×3 IMPLANT
PAD ARMBOARD 7.5X6 YLW CONV (MISCELLANEOUS) ×6 IMPLANT
SURGILUBE 2OZ TUBE FLIPTOP (MISCELLANEOUS) IMPLANT
SURGILUBE 3G PEEL PACK STRL (MISCELLANEOUS) ×2 IMPLANT
SUT VIC AB 5-0 PS2 18 (SUTURE) IMPLANT
SWAB COLLECTION DEVICE MRSA (MISCELLANEOUS) IMPLANT
TOWEL OR 17X24 6PK STRL BLUE (TOWEL DISPOSABLE) ×3 IMPLANT
TOWEL OR 17X26 10 PK STRL BLUE (TOWEL DISPOSABLE) ×3 IMPLANT
TUBE ANAEROBIC SPECIMEN COL (MISCELLANEOUS) IMPLANT
UNDERPAD 30X30 INCONTINENT (UNDERPADS AND DIAPERS) ×3 IMPLANT

## 2014-04-07 NOTE — Op Note (Signed)
Operative Note   DATE OF OPERATION: 04/07/2014  LOCATION: [facility] OR outpatient  SURGICAL DIVISION: Plastic Surgery  PREOPERATIVE DIAGNOSES:  Large left leg wound  POSTOPERATIVE DIAGNOSES:  same  PROCEDURE:  Preparation of wound bed for placement of Acell (1 gm powder and 7 x 10 cm) and VAC placement (14 x 3 x 2 cm)  SURGEON: Claire Sanger, DO  ANESTHESIA:  General.   COMPLICATIONS: None.   INDICATIONS FOR PROCEDURE:  The patient, Kelly [name] is a 68 y.o. female born on 1946-07-14, is here for treatment of left leg wound. MRN: [id number]  CONSENT:  Informed consent was obtained directly from the patient. Risks, benefits and alternatives were fully discussed. Specific risks including but not limited to bleeding, infection, hematoma, seroma, scarring, pain, infection, contracture, asymmetry, wound healing problems, and need for further surgery were all discussed. The patient did have an ample opportunity to have questions answered to satisfaction.   DESCRIPTION OF PROCEDURE:  The patient was taken to the operating room. The patient's operative site was prepped and draped in a sterile fashion. A time out was performed and all information was confirmed to be correct.  Sedation was administered.  The area was irrigated.   Hemostasis was achieved with pressure.  The Acell powder was placed followed by the Acell sheet.  The adaptic was applied and the VAC.  There was an excellent seal.  The patient tolerated the procedure well.  There were no complications. The patient was allowed to wake and taken to the recovery room in satisfactory condition.

## 2014-04-07 NOTE — Anesthesia Preprocedure Evaluation (Addendum)
Anesthesia Evaluation  Patient identified by MRN, date of birth, ID band Patient awake    Reviewed: Allergy & Precautions, H&P , Patient's Chart, lab work & pertinent test results  Airway Mallampati: II TM Distance: >3 FB Neck ROM: Full    Dental  (+) Teeth Intact   Pulmonary former smoker,  breath sounds clear to auscultation        Cardiovascular hypertension, Rhythm:Regular Rate:Normal     Neuro/Psych    GI/Hepatic   Endo/Other  diabetes  Renal/GU      Musculoskeletal   Abdominal (+) + obese,   Peds  Hematology   Anesthesia Other Findings   Reproductive/Obstetrics                          Anesthesia Physical Anesthesia Plan  ASA: III  Anesthesia Plan: General   Post-op Pain Management:    Induction: Intravenous  Airway Management Planned: Oral ETT  Additional Equipment:   Intra-op Plan:   Post-operative Plan:   Informed Consent: I have reviewed the patients History and Physical, chart, labs and discussed the procedure including the risks, benefits and alternatives for the proposed anesthesia with the patient or authorized representative who has indicated his/her understanding and acceptance.   Dental advisory given  Plan Discussed with: CRNA and Anesthesiologist  Anesthesia Plan Comments:         Anesthesia Quick Evaluation

## 2014-04-07 NOTE — Brief Op Note (Signed)
04/07/2014  1:35 PM  PATIENT:  Hassel Neth  68 y.o. female  PRE-OPERATIVE DIAGNOSIS:  open wound of left leg  POST-OPERATIVE DIAGNOSIS:  same  PROCEDURE:  Procedure(s): IRRIGATION AND DEBRIDEMENT LEFT LEG WOUND WITH PLACEMENT OF A CELL AND VAC (Left)  SURGEON:  Surgeon(s) and Role:    * Tausha Milhoan Sanger, DO - Primary  PHYSICIAN ASSISTANT: Shawn Rayburn, PA  ASSISTANTS: none   ANESTHESIA:   general  EBL:     BLOOD ADMINISTERED:none  DRAINS: none   LOCAL MEDICATIONS USED:  NONE  SPECIMEN:  No Specimen  DISPOSITION OF SPECIMEN:  N/A  COUNTS:  YES  TOURNIQUET:  * No tourniquets in log *  DICTATION: .Dragon Dictation  PLAN OF CARE: Discharge to home after PACU  PATIENT DISPOSITION:  PACU - hemodynamically stable.   Delay start of Pharmacological VTE agent (>24hrs) due to surgical blood loss or risk of bleeding: no

## 2014-04-07 NOTE — H&P (Addendum)
[facility] Medical Consultation  THURSA EMME ELF:[id number] DOB: August 18, 1946 DOA: 04/07/2014 PCP: Alvester Chou, NP   Requesting physician: Dr. Migdalia Dk Date of consultation:  04/07/2014 Reason for consultation:  Left leg pain and swelling  Impression/Recommendations Principal Problem:   Left leg pain Active Problems:   HYPERTENSION   Diabetes mellitus type 2 with neurological manifestations   Ulcer of lower limb, unspecified   Pulmonary embolism   Chronic kidney disease, stage 3   Unspecified constipation    Increased left lower extremity swelling and pain with new pain in the left heel.  DDx includes gout, stress fracture, progressive diabetic neuropathy, recurrent DVT (especially if not recently anticoagulated in the setting of IVC filter).  Plantar fasciitis would not cause pain to radiate up posterior calf.  -  X-ray of left heel -  Duplex leg -  Start therapeutic lovenox -  Recommend discharging on lovenox and if no bleeding within 1-2 weeks and no further need for surgical procedures, restart bridging to coumadin -  Check creatinine and hgb and repeat in AM -  Occult stool -  Increase gabapentin -  Trial of colchicine  Chronic diastolic heart failure, stable right lower extremity edema, no SOB -  Continue diuretic   H8NI with complication of diabetic neuropathy, CBG stable, A1c 6.1 02/22/2014 -  Low dose SSI   Acute pulmonary embolism dx 01/2014 -  s/p IVC filter 6/30 -  Resuming a/c with lovenox -  Consider bridging to coumadin if not evidence of bleeding  CKD stage 3, creatinine at baseline on 8/5 -  Minimize nephrotoxins and renally dose medications -  Repeat creatinine today while restarting lovenox   Gout, stable, continue allopurinol, renally dosed  -  Check uric acid -  Will start brief trial of colchicine in case there is a gouty component to her pain  Diabetic neuropathy,  -  Increase Gabapentin to 600mg  BID  Anemia of renal parenchymal  disease/chronic disease, improved at last check on 8/5 - Repeat   Chief Complaint:  Left leg pain and swelling  HPI:  The patient is a 68 year old female with history of chronic diastolic heart failure, hypertension, hyperlipidemia, diabetes mellitus type 2, obstructive sleep apnea, GERD, depression, chronic lymphedema, and indwelling Foley catheter. She has had a complicated spring and summer of 2015. Initially, she was admitted at [facility] from 6/15-6/25 for routine debridement of a chronic infected area of her left inner thigh by plastic surgery. She underwent routine debridement and panic ulectomy, however her postoperative hospital course was complicated by urinary tract infection and pulmonary embolism. She was started on Lovenox and transitioned to Coumadin. She was readmitted on 6/28 with severe sepsis and hemorrhagic shock secondary to bleeding from her left leg ulcer. She required numerous blood transfusions and her anticoagulation was discontinued. On 6/30 she underwent IVC filter placement. She had debridement of that left ulcer and extraction of clot on 7/1, recurrent debridement on 7/8, 7/21, 7/30. During this time her anticoagulation was held. On 7/31 she was seen by Doctor Dellia Nims at [facility] because she was complaining of increased pain and swelling of her left leg. According to the notes, she underwent duplex of the left lower extremity however the results are not in the system and I was unable to reach staff at [facility] to obtain the test results. They were not sent with her with her medical records today. Dr. Dellia Nims Is out of town until August 15 and therefore cannot give history.  According to the notes, she was supposed to start on Lovenox twice a day dosing for DVT treatment, however the patient reports that she has not been receiving Lovenox at [facility]. Records from [facility] do not list Lovenox on her medication list. She was readmitted on 8/12 for  routine debridement and replacement of wound VAC to the left leg.  She was reporting that she has had worsening pain in her left leg starting at the heel and radiating up the posterior calf, 10 out of 10. Previously she was able to ambulate with minimal assistance from one person. Over the last 2 days, she has had to have 3 people assist her and she is unable to bear weight on the left heel. She denies any falls or trauma to the left leg. She states her leg has been more swollen.  We have been consulted to determine etiology of her left leg pain and determine if she should resume anticoagulation.  Per conversation with Dr. Migdalia Dk, her risk of bleeding from her left leg wound has decreased considerably since her previous hemorrhage in late June.     Review of Systems:   Denies fevers, chills, weight loss or gain, changes to hearing and vision.  Denies rhinorrhea, sinus congestion, sore throat.  Denies chest pain and palpitations.  Denies SOB, wheezing, cough.  Denies nausea, vomiting.  Has chronic constipation which has improved with increased miralax.  Chronic indwelling foley without suprapubic discomfort.    Denies hematemesis.  Possibly had some blood in stool a few weeks ago, but she is unsure if this was actually bleeding from her left leg ulcer.  Denies lymphadenopathy.  Chronic lower extremity edema, but increased on left leg.  Denies focal numbness, weakness, slurred speech, confusion, facial droop.  Denies anxiety and depression.    Past Medical History  Diagnosis Date  . Hypertension   . Hyperlipidemia   . CHF (congestive heart failure)   . GERD (gastroesophageal reflux disease)   . Depression   . Sleep apnea     doesn't use cpap machine or 02 at night  . Headache(784.0)     Migraines  . Arthritis     knees  . Anemia     Had blood transfusion x 2 in yhe 90s  . Lymphedema of leg     left leg  . Pulmonary embolism 02/18/14    diagnosed at [facility] with CT angio chest  . Chronic  indwelling Foley catheter   . Diabetes mellitus     not on medications   Past Surgical History  Procedure Laterality Date  . Rotator cuff repair      right  . Knee arthroscopy      right  . Temple biopsy    . Colonoscopy with propofol  09/23/2012    Procedure: COLONOSCOPY WITH PROPOFOL;  Surgeon: Jerene Bears, MD;  Location: [facility];  Service: Gastroenterology;  Laterality: N/A;  . Paniculectomy with wound debridement Left 02/08/2014    left medial thigh  . Irrigation and debridement abscess Left 02/24/2014    Procedure: MINOR INCISION AND DRAINAGE OF ABSCESS;  Surgeon: Theodoro Kos, DO;  Location: [facility];  Service: Plastics;  Laterality: Left;  . I&d extremity Left 03/03/2014    Procedure: IRRIGATION AND DEBRIDEMENT LEFT LEG WOUND AND PLACEMENT OF A CELL AND VAC;  Surgeon: Theodoro Kos, DO;  Location: [place];  Service: Plastics;  Laterality: Left;  . Incision and drainage of wound Left 03/16/2014    Procedure: IRRIGATION  AND DEBRIDEMENT OF LEFT THIGH WOUND, APPLICATION ACELL;  Surgeon: Irene Limbo, MD;  Location: [place];  Service: Plastics;  Laterality: Left;  . Incision and drainage of wound Left 03/25/2014    Procedure: IRRIGATION AND DEBRIDEMENT OF LEFT LEG WOUND WITH PLACEMENT OF ACELL/VAC;  Surgeon: Theodoro Kos, DO;  Location: [place];  Service: Plastics;  Laterality: Left;  . Incision and drainage of wound Left 03/31/2014    Procedure: IRRIGATION AND DEBRIDEMENT LEFT LEG WOUND WITH PLACEMENT OF A-CELL/VAC;  Surgeon: Theodoro Kos, DO;  Location: [place];  Service: Plastics;  Laterality: Left;   Social History:  reports that she quit smoking about 32 years ago. Her smoking use included Cigarettes. She smoked 0.00 packs per day. She has never used smokeless tobacco. She reports that she does not drink alcohol or use illicit drugs.  Allergies  Allergen Reactions  . Bactrim [Sulfamethoxazole-Tmp Ds] Other (See Comments)    Hyperkalemia (July 2015)  . Penicillins Hives   Family  History  Problem Relation Age of Onset  . Emphysema Father   . Asthma Brother   . Heart disease Mother   . Stomach cancer Brother   . Heart disease Maternal Grandmother   . Diabetes Mother   . Diabetes Sister     Prior to Admission medications   Medication Sig Start Date End Date Taking? Authorizing Provider  allopurinol (ZYLOPRIM) 100 MG tablet Take 100 mg by mouth daily.    Yes Historical Provider, MD  aspirin EC 325 MG tablet Take 325 mg by mouth daily.   Yes Historical Provider, MD  atorvastatin (LIPITOR) 40 MG tablet Take 40 mg by mouth daily.    Yes Historical Provider, MD  chlorthalidone (HYGROTON) 25 MG tablet Take 12.5 mg by mouth daily.   Yes Historical Provider, MD  esomeprazole (NEXIUM) 40 MG capsule Take 40 mg by mouth daily before breakfast. Take on an empty stomach   Yes Historical Provider, MD  gabapentin (NEURONTIN) 300 MG capsule Take 300 mg by mouth 3 (three) times daily.   Yes Historical Provider, MD  isosorbide mononitrate (ISMO,MONOKET) 10 MG tablet Take 10 mg by mouth 2 (two) times daily.   Yes Historical Provider, MD  magnesium oxide (MAG-OX) 400 MG tablet Take 400 mg by mouth daily.    Yes Historical Provider, MD  metoprolol tartrate (LOPRESSOR) 25 MG tablet Take 37.5 mg by mouth 2 (two) times daily.    Yes Historical Provider, MD  Oxycodone HCl 10 MG TABS Take 10 mg by mouth every 4 (four) hours as needed (for pain).   Yes Historical Provider, MD  polyethylene glycol (MIRALAX / GLYCOLAX) packet Take 17 g by mouth daily. Mix with 4-6 oz liquid   Yes Historical Provider, MD  zolpidem (AMBIEN CR) 6.25 MG CR tablet Take 6.25 mg by mouth at bedtime as needed for sleep.   Yes Historical Provider, MD  promethazine (PHENERGAN) 25 MG tablet Take 25 mg by mouth 2 (two) times daily as needed for nausea or vomiting.    Historical Provider, MD   Physical Exam: Blood pressure 159/60, pulse 89, temperature 97.6 F (36.4 C), temperature source Oral, resp. rate 17, height 5\' 4"   (1.626 m), weight 144.244 kg (318 lb), SpO2 97.00%. Filed Vitals:   04/07/14 1600 04/07/14 1611 04/07/14 1700 04/07/14 1754  BP:      Pulse: 75 79 75 89  Temp:      TempSrc:      Resp: 14 20 17 17   Height:  Weight:      SpO2: 97% 98% 100% 97%     General:  Obese BF, NAD  Eyes:  PERRL, anicteric, non-injected.  ENT:  Nares clear.  OP clear, non-erythematous without plaques or exudates.  MMM.  Neck:  Supple without TM or JVD.    Lymph:  No cervical, supraclavicular, or submandibular LAD.  Cardiovascular:  RRR, normal S1, S2, without m/r/g.  2+ pulses, warm extremities  Respiratory:  CTA bilaterally without increased WOB.  Abdomen:  NABS.  Soft, ND/NT.    Skin:  Left leg wound vac in place with serosanguinous drainage  Musculoskeletal:  Normal bulk and tone.  1+ RLE pitting edema, and 2+ LLE pitting edema with warmth, TTP posteriorly and along heel.  + pain with dorsiflexion of the left big toe.  No pain over insertion of the left achilles or achilles tendon  Psychiatric:  A & O x 4.  Appropriate affect.  Neurologic:  CN 3-12 intact.  5/5 strength.  Sensation intact.  Labs on Admission:  Basic Metabolic Panel: No results found for this basename: NA, K, CL, CO2, GLUCOSE, BUN, CREATININE, CALCIUM, MG, PHOS,  in the last 168 hours Liver Function Tests: No results found for this basename: AST, ALT, ALKPHOS, BILITOT, PROT, ALBUMIN,  in the last 168 hours No results found for this basename: LIPASE, AMYLASE,  in the last 168 hours No results found for this basename: AMMONIA,  in the last 168 hours CBC: No results found for this basename: WBC, NEUTROABS, HGB, HCT, MCV, PLT,  in the last 168 hours Cardiac Enzymes: No results found for this basename: CKTOTAL, CKMB, CKMBINDEX, TROPONINI,  in the last 168 hours BNP: No components found with this basename: POCBNP,  CBG:  Recent Labs Lab 04/07/14 1042  GLUCAP 124*    Radiological Exams on Admission: No results  found.  Time spent: 60 min  Janece Canterbury [facility] Pager [phone]  If 7PM-7AM, please contact night-coverage [email or web address] Password [facility] 04/07/2014, 6:11 PM

## 2014-04-07 NOTE — Anesthesia Postprocedure Evaluation (Signed)
  Anesthesia Post-op Note  Patient: Kelly [name]  Procedure(s) Performed: Procedure(s): IRRIGATION AND DEBRIDEMENT LEFT LEG WOUND WITH PLACEMENT OF A CELL AND VAC (Left)  Patient Location: PACU  Anesthesia Type:General  Level of Consciousness: awake, alert  and oriented  Airway and Oxygen Therapy: Patient Spontanous Breathing and Patient connected to nasal cannula oxygen  Post-op Pain: mild  Post-op Assessment: Post-op Vital signs reviewed, Patient's Cardiovascular Status Stable, Respiratory Function Stable, Patent Airway, No signs of Nausea or vomiting and Pain level controlled  Post-op Vital Signs: stable  Last Vitals:  Filed Vitals:   04/07/14 1754  BP:   Pulse: 89  Temp:   Resp: 17    Complications: No apparent anesthesia complications

## 2014-04-07 NOTE — H&P (View-Only) (Signed)
Kelly [name] is an 67 y.o. female.   Chief complaint: left thigh ulcer HPI: The patient is a 67 yrs old bf here for treatment of her left thigh ulcer.  She underwent a panniculectomy type procedure and was sent to a nursing facility.  She developed a DVT and anticoagulaton was started.  She bleed into the wound and had to be taken to the OR for I and D.  The area was debrided and Acell with the VAC was placed.  She has been taken to the OR for a VAC change and Acell placement several times and presents today for another VAC change with placement of Acell. She has a greenfield filter in place.  Past Medical History  Diagnosis Date  . Hypertension   . Hyperlipidemia   . CHF (congestive heart failure)   . GERD (gastroesophageal reflux disease)   . Depression   . Sleep apnea     doesn't use cpap machine or 02 at night  . Headache(784.0)     Migraines  . Arthritis     knees  . Anemia     Had blood transfusion x 2 in yhe 90s  . Lymphedema of leg   . Pulmonary embolism 02/18/14    diagnosed at [facility] with CT angio chest  . Chronic indwelling Foley catheter   . Diabetes mellitus     not on medications    Past Surgical History  Procedure Laterality Date  . Rotator cuff repair      right  . Knee arthroscopy      right  . Temple biopsy    . Colonoscopy with propofol  09/23/2012    Procedure: COLONOSCOPY WITH PROPOFOL;  Surgeon: Jay M Pyrtle, MD;  Location: [facility];  Service: Gastroenterology;  Laterality: N/A;  . Paniculectomy with wound debridement Left 02/08/2014    left medial thigh  . Irrigation and debridement abscess Left 02/24/2014    Procedure: MINOR INCISION AND DRAINAGE OF ABSCESS;  Surgeon: Claire Sanger, DO;  Location: [facility];  Service: Plastics;  Laterality: Left;  . I&d extremity Left 03/03/2014    Procedure: IRRIGATION AND DEBRIDEMENT LEFT LEG WOUND AND PLACEMENT OF A CELL AND VAC;  Surgeon: Claire Sanger, DO;  Location: [facility];  Service: Plastics;  Laterality: Left;  .  Incision and drainage of wound Left 03/16/2014    Procedure: IRRIGATION AND DEBRIDEMENT OF LEFT THIGH WOUND, APPLICATION ACELL;  Surgeon: Brinda Thimmappa, MD;  Location: [facility];  Service: Plastics;  Laterality: Left;  . Incision and drainage of wound Left 03/25/2014    Procedure: IRRIGATION AND DEBRIDEMENT OF LEFT LEG WOUND WITH PLACEMENT OF ACELL/VAC;  Surgeon: Claire Sanger, DO;  Location: [facility];  Service: Plastics;  Laterality: Left;    Family History  Problem Relation Age of Onset  . Emphysema Father   . Asthma Brother   . Heart disease Mother   . Stomach cancer Brother   . Heart disease Maternal Grandmother   . Diabetes Mother   . Diabetes Sister    Social History:  reports that she quit smoking about 32 years ago. Her smoking use included Cigarettes. She smoked 0.00 packs per day. She has never used smokeless tobacco. She reports that she does not drink alcohol or use illicit drugs.  Allergies:  Allergies  Allergen Reactions  . Bactrim [Sulfamethoxazole-Tmp Ds] Other (See Comments)    Hyperkalemia (July 2015)  . Penicillins Hives    Medications Prior to Admission  Medication Sig Dispense Refill  .   allopurinol (ZYLOPRIM) 100 MG tablet Take 100 mg by mouth daily.       . aspirin EC 325 MG tablet Take 325 mg by mouth daily.      . atorvastatin (LIPITOR) 40 MG tablet Take 40 mg by mouth daily.       . chlorthalidone (HYGROTON) 25 MG tablet Take 12.5 mg by mouth daily.      . doxycycline (VIBRAMYCIN) 100 MG capsule Take 100 mg by mouth 2 (two) times daily. For 7 days      . esomeprazole (NEXIUM) 40 MG capsule Take 40 mg by mouth daily before breakfast. Take on an empty stomach      . gabapentin (NEURONTIN) 300 MG capsule Take 300 mg by mouth 3 (three) times daily.      . isosorbide mononitrate (ISMO,MONOKET) 10 MG tablet Take 10 mg by mouth 2 (two) times daily.      . magnesium oxide (MAG-OX) 400 MG tablet Take 400 mg by mouth daily.       . metoprolol tartrate (LOPRESSOR) 25 MG  tablet Take 37.5 mg by mouth 2 (two) times daily.       . Oxycodone HCl 10 MG TABS Take 10 mg by mouth every 4 (four) hours as needed (for pain).      . polyethylene glycol (MIRALAX / GLYCOLAX) packet Take 17 g by mouth daily. Mix with 4-6 oz liquid      . promethazine (PHENERGAN) 25 MG tablet Take 25 mg by mouth 2 (two) times daily as needed for nausea or vomiting.      . zolpidem (AMBIEN CR) 6.25 MG CR tablet Take 6.25 mg by mouth at bedtime as needed for sleep.        Results for orders placed during the hospital encounter of 03/31/14 (from the past 48 hour(s))  GLUCOSE, CAPILLARY     Status: Abnormal   Collection Time    03/31/14 12:51 PM      Result Value Ref Range   Glucose-Capillary 132 (*) 70 - 99 mg/dL  BASIC METABOLIC PANEL     Status: Abnormal   Collection Time    03/31/14  1:01 PM      Result Value Ref Range   Sodium 139  137 - 147 mEq/L   Potassium 4.7  3.7 - 5.3 mEq/L   Chloride 106  96 - 112 mEq/L   CO2 21  19 - 32 mEq/L   Glucose, Bld 124 (*) 70 - 99 mg/dL   BUN 21  6 - 23 mg/dL   Creatinine, Ser 1.18 (*) 0.50 - 1.10 mg/dL   Calcium 8.4  8.4 - 10.5 mg/dL   GFR calc non Af Amer 47 (*) >90 mL/min   GFR calc Af Amer 54 (*) >90 mL/min   Comment: (NOTE)     The eGFR has been calculated using the CKD EPI equation.     This calculation has not been validated in all clinical situations.     eGFR's persistently <90 mL/min signify possible Chronic Kidney     Disease.   Anion gap 12  5 - 15  CBC     Status: Abnormal   Collection Time    03/31/14  1:01 PM      Result Value Ref Range   WBC 7.1  4.0 - 10.5 K/uL   RBC 3.21 (*) 3.87 - 5.11 MIL/uL   Hemoglobin 9.4 (*) 12.0 - 15.0 g/dL   HCT 29.8 (*) 36.0 - 46.0 %   MCV   92.8  78.0 - 100.0 fL   MCH 29.3  26.0 - 34.0 pg   MCHC 31.5  30.0 - 36.0 g/dL   RDW 16.0 (*) 11.5 - 15.5 %   Platelets 208  150 - 400 K/uL   No results found.  ROS  Blood pressure 146/46, pulse 61, temperature 97.9 F (36.6 C), temperature source  Oral, resp. rate 18, height 5' 4" (1.626 m), weight 146.512 kg (323 lb), SpO2 100.00%. Physical Exam   Assessment/Plan Irrigation and debridement of left hip ulcer with Acell and VAC placement.  SANGER,CLAIRE 03/31/2014, 3:22 PM    

## 2014-04-07 NOTE — Progress Notes (Signed)
Report given to mark rn as caregiver 

## 2014-04-07 NOTE — Transfer of Care (Signed)
Immediate Anesthesia Transfer of Care Note  Patient: Kelly [name]  Procedure(s) Performed: Procedure(s): IRRIGATION AND DEBRIDEMENT LEFT LEG WOUND WITH PLACEMENT OF A CELL AND VAC (Left)  Patient Location: PACU  Anesthesia Type:MAC  Level of Consciousness: awake, alert , oriented, patient cooperative and responds to stimulation  Airway & Oxygen Therapy: Patient Spontanous Breathing and Patient connected to face mask oxygen  Post-op Assessment: Report given to PACU RN, Post -op Vital signs reviewed and stable and Patient moving all extremities X 4  Post vital signs: Reviewed and stable  Complications: No apparent anesthesia complications

## 2014-04-07 NOTE — Interval H&P Note (Signed)
History and Physical Interval Note:  04/07/2014 12:14 PM  Kelly [name]  has presented today for surgery, with the diagnosis of open wound of left leg  The various methods of treatment have been discussed with the patient and family. After consideration of risks, benefits and other options for treatment, the patient has consented to  Procedure(s): IRRIGATION AND DEBRIDEMENT LEFT LEG WOUND WITH PLACEMENT OF A CELL AND VAC (Left) as a surgical intervention .  The patient's history has been reviewed, patient examined, no change in status, stable for surgery.  I have reviewed the patient's chart and labs.  Questions were answered to the patient's satisfaction.     SANGER,Halyn Flaugher

## 2014-04-07 NOTE — Progress Notes (Signed)
ANTICOAGULATION CONSULT NOTE - Initial Consult  Pharmacy Consult for Lovenox Indication: LLE DVT  Allergies  Allergen Reactions  . Bactrim [Sulfamethoxazole-Tmp Ds] Other (See Comments)    Hyperkalemia (July 2015)  . Penicillins Hives    Patient Measurements: Height: 5\' 4"  (162.6 cm) Weight: 318 lb (144.244 kg) IBW/kg (Calculated) : 54.7  Vital Signs: Temp: 98 F (36.7 C) (08/12 1830) Temp src: Oral (08/12 1046) BP: 159/60 mmHg (08/12 1525) Pulse Rate: 84 (08/12 1830)  Labs: No results found for this basename: HGB, HCT, PLT, APTT, LABPROT, INR, HEPARINUNFRC, CREATININE, CKTOTAL, CKMB, TROPONINI,  in the last 72 hours  Estimated Creatinine Clearance: 66.1 ml/min (by C-G formula based on Cr of 1.18).   Medical History: Past Medical History  Diagnosis Date  . Hypertension   . Hyperlipidemia   . CHF (congestive heart failure)   . GERD (gastroesophageal reflux disease)   . Depression   . Sleep apnea     doesn't use cpap machine or 02 at night  . Headache(784.0)     Migraines  . Arthritis     knees  . Anemia     Had blood transfusion x 2 in yhe 90s  . Lymphedema of leg     left leg  . Pulmonary embolism 02/18/14    diagnosed at [facility] with [facility] angio chest  . Chronic indwelling Foley catheter   . Diabetes mellitus     not on medications    Assessment: 21 YOF with a a history of DVT/PE for which she has been anticoagulated with lovenox >> warfarin previously. Anticoagulation has been complicated due to an infected area of the L-inner thigh that has required several episodes of debridement. In late June it appears the patient had an episode of hemorrhagic shock with concurrent anticoagulation and a bleeding LLE ulcer - an IVC filter was placed at that time. Recently, the patient developed worsening LLE pain and presumably had a recurrent LLE DVT. The patient was admitted 8/12 for I&D, wound vac change, and to determine if anticoagulation should be resumed. Dr. Sheran Fava has  discussed with Dr. Migdalia Dk and it appears as if her "bleeding risk has decreased considerably since her previous hemorrhage in late June". Pharmacy has been consulted to start full-dose lovenox for anticoagulation. Wt: 144.2 kg, SCr 1.2, CrCl~50-60 ml/min. Will plan to round down given the patient's bleeding risk - will need to monitor the LLE closely for any s/sx of bleeding.   Goal of Therapy:  Anti-Xa level 0.6-1 units/ml 4hrs after LMWH dose given Monitor platelets by anticoagulation protocol: Yes   Plan:  1. Start Lovenox 140 mg SQ every 12 hours 2. Will need to monitor the LLE closely for any s/sx of bleeding 3. Will monitor renal function for need for dose adjustments 4. Given the patient's weight and risk of bleeding, will plan to get a anti-Xa level at steady state to ensure dosing appropriate.   Alycia Rossetti, PharmD, BCPS Clinical Pharmacist Pager: [phone] 04/07/2014 7:34 PM

## 2014-04-07 NOTE — Discharge Instructions (Signed)
Continue with the VAC and do not change or remove

## 2014-04-07 NOTE — Consult Note (Deleted)
[facility] Medical Consultation  Kelly [name] FGH:[id number] DOB: Dec 22, 1945 DOA: 04/07/2014 PCP: Alvester Chou, NP   Requesting physician: Dr. Migdalia Dk Date of consultation:  04/07/2014 Reason for consultation:  Left leg pain and swelling  Impression/Recommendations Principal Problem:   Left leg pain Active Problems:   HYPERTENSION   Diabetes mellitus type 2 with neurological manifestations   Ulcer of lower limb, unspecified   Pulmonary embolism   Chronic kidney disease, stage 3   Unspecified constipation    Increased left lower extremity swelling and pain with new pain in the left heel.  DDx includes gout, stress fracture, progressive diabetic neuropathy, recurrent DVT (especially if not recently anticoagulated in the setting of IVC filter).  Plantar fasciitis would not cause pain to radiate up posterior calf.  -  X-ray of left heel -  Duplex leg -  Start therapeutic lovenox -  Recommend discharging on lovenox and if no bleeding within 1-2 weeks and no further need for surgical procedures, restart bridging to coumadin -  Check creatinine and hgb and repeat in AM -  Occult stool -  Increase gabapentin -  Trial of colchicine  Chronic diastolic heart failure, stable right lower extremity edema, no SOB -  Continue diuretic   C7EL with complication of diabetic neuropathy, CBG stable, A1c 6.1 02/22/2014 -  Low dose SSI   Acute pulmonary embolism dx 01/2014 -  s/p IVC filter 6/30 -  Resuming a/c with lovenox -  Consider bridging to coumadin if not evidence of bleeding  CKD stage 3, creatinine at baseline on 8/5 -  Minimize nephrotoxins and renally dose medications -  Repeat creatinine today while restarting lovenox   Gout, stable, continue allopurinol, renally dosed  -  Check uric acid -  Will start brief trial of colchicine in case there is a gouty component to her pain  Diabetic neuropathy,  -  Increase Gabapentin to 600mg  BID  Anemia of renal parenchymal  disease/chronic disease, improved at last check on 8/5 - Repeat   I will followup again tomorrow. Please contact me if I can be of assistance in the meanwhile. Thank you for this consultation.  Chief Complaint:  Left leg pain and swelling  HPI:  The patient is a 68 year old female with history of chronic diastolic heart failure, hypertension, hyperlipidemia, diabetes mellitus type 2, obstructive sleep apnea, GERD, depression, chronic lymphedema, and indwelling Foley catheter. She has had a complicated spring and summer of 2015. Initially, she was admitted at [facility] from 6/15-6/25 for routine debridement of a chronic infected area of her left inner thigh by plastic surgery. She underwent routine debridement and panic ulectomy, however her postoperative hospital course was complicated by urinary tract infection and pulmonary embolism. She was started on Lovenox and transitioned to Coumadin. She was readmitted on 6/28 with severe sepsis and hemorrhagic shock secondary to bleeding from her left leg ulcer. She required numerous blood transfusions and her anticoagulation was discontinued. On 6/30 she underwent IVC filter placement. She had debridement of that left ulcer and extraction of clot on 7/1, recurrent debridement on 7/8, 7/21, 7/30. During this time her anticoagulation was held. On 7/31 she was seen by Doctor Dellia Nims at [facility] because she was complaining of increased pain and swelling of her left leg. According to the notes, she underwent duplex of the left lower extremity however the results are not in the system and I was unable to reach staff at [facility] to obtain the test results. They were  not sent with her with her medical records today. Dr. Dellia Nims Is out of town until August 15 and therefore cannot give history.  According to the notes, she was supposed to start on Lovenox twice a day dosing for DVT treatment, however the patient reports that she has not been  receiving Lovenox at [facility]. Records from [facility] do not list Lovenox on her medication list. She was readmitted on 8/12 for routine debridement and replacement of wound VAC to the left leg.  She was reporting that she has had worsening pain in her left leg starting at the heel and radiating up the posterior calf, 10 out of 10. Previously she was able to ambulate with minimal assistance from one person. Over the last 2 days, she has had to have 3 people assist her and she is unable to bear weight on the left heel. She denies any falls or trauma to the left leg. She states her leg has been more swollen.  We have been consulted to determine etiology of her left leg pain and determine if she should resume anticoagulation.  Per conversation with Dr. Migdalia Dk, her risk of bleeding from her left leg wound has decreased considerably since her previous hemorrhage in late June.     Review of Systems:   Denies fevers, chills, weight loss or gain, changes to hearing and vision.  Denies rhinorrhea, sinus congestion, sore throat.  Denies chest pain and palpitations.  Denies SOB, wheezing, cough.  Denies nausea, vomiting.  Has chronic constipation which has improved with increased miralax.  Chronic indwelling foley without suprapubic discomfort.    Denies hematemesis.  Possibly had some blood in stool a few weeks ago, but she is unsure if this was actually bleeding from her left leg ulcer.  Denies lymphadenopathy.  Chronic lower extremity edema, but increased on left leg.  Denies focal numbness, weakness, slurred speech, confusion, facial droop.  Denies anxiety and depression.    Past Medical History  Diagnosis Date  . Hypertension   . Hyperlipidemia   . CHF (congestive heart failure)   . GERD (gastroesophageal reflux disease)   . Depression   . Sleep apnea     doesn't use cpap machine or 02 at night  . Headache(784.0)     Migraines  . Arthritis     knees  . Anemia     Had blood transfusion x 2 in yhe  90s  . Lymphedema of leg     left leg  . Pulmonary embolism 02/18/14    diagnosed at [facility] with CT angio chest  . Chronic indwelling Foley catheter   . Diabetes mellitus     not on medications   Past Surgical History  Procedure Laterality Date  . Rotator cuff repair      right  . Knee arthroscopy      right  . Temple biopsy    . Colonoscopy with propofol  09/23/2012    Procedure: COLONOSCOPY WITH PROPOFOL;  Surgeon: Jerene Bears, MD;  Location: [facility];  Service: Gastroenterology;  Laterality: N/A;  . Paniculectomy with wound debridement Left 02/08/2014    left medial thigh  . Irrigation and debridement abscess Left 02/24/2014    Procedure: MINOR INCISION AND DRAINAGE OF ABSCESS;  Surgeon: Theodoro Kos, DO;  Location: [facility];  Service: Plastics;  Laterality: Left;  . I&d extremity Left 03/03/2014    Procedure: IRRIGATION AND DEBRIDEMENT LEFT LEG WOUND AND PLACEMENT OF A CELL AND VAC;  Surgeon: Theodoro Kos, DO;  Location: [place];  Service: Plastics;  Laterality: Left;  . Incision and drainage of wound Left 03/16/2014    Procedure: IRRIGATION AND DEBRIDEMENT OF LEFT THIGH WOUND, APPLICATION ACELL;  Surgeon: Irene Limbo, MD;  Location: [place];  Service: Plastics;  Laterality: Left;  . Incision and drainage of wound Left 03/25/2014    Procedure: IRRIGATION AND DEBRIDEMENT OF LEFT LEG WOUND WITH PLACEMENT OF ACELL/VAC;  Surgeon: Theodoro Kos, DO;  Location: [place];  Service: Plastics;  Laterality: Left;  . Incision and drainage of wound Left 03/31/2014    Procedure: IRRIGATION AND DEBRIDEMENT LEFT LEG WOUND WITH PLACEMENT OF A-CELL/VAC;  Surgeon: Theodoro Kos, DO;  Location: [place];  Service: Plastics;  Laterality: Left;   Social History:  reports that she quit smoking about 32 years ago. Her smoking use included Cigarettes. She smoked 0.00 packs per day. She has never used smokeless tobacco. She reports that she does not drink alcohol or use illicit drugs.  Allergies  Allergen Reactions   . Bactrim [Sulfamethoxazole-Tmp Ds] Other (See Comments)    Hyperkalemia (July 2015)  . Penicillins Hives   Family History  Problem Relation Age of Onset  . Emphysema Father   . Asthma Brother   . Heart disease Mother   . Stomach cancer Brother   . Heart disease Maternal Grandmother   . Diabetes Mother   . Diabetes Sister     Prior to Admission medications   Medication Sig Start Date End Date Taking? Authorizing Provider  allopurinol (ZYLOPRIM) 100 MG tablet Take 100 mg by mouth daily.    Yes Historical Provider, MD  aspirin EC 325 MG tablet Take 325 mg by mouth daily.   Yes Historical Provider, MD  atorvastatin (LIPITOR) 40 MG tablet Take 40 mg by mouth daily.    Yes Historical Provider, MD  chlorthalidone (HYGROTON) 25 MG tablet Take 12.5 mg by mouth daily.   Yes Historical Provider, MD  esomeprazole (NEXIUM) 40 MG capsule Take 40 mg by mouth daily before breakfast. Take on an empty stomach   Yes Historical Provider, MD  gabapentin (NEURONTIN) 300 MG capsule Take 300 mg by mouth 3 (three) times daily.   Yes Historical Provider, MD  isosorbide mononitrate (ISMO,MONOKET) 10 MG tablet Take 10 mg by mouth 2 (two) times daily.   Yes Historical Provider, MD  magnesium oxide (MAG-OX) 400 MG tablet Take 400 mg by mouth daily.    Yes Historical Provider, MD  metoprolol tartrate (LOPRESSOR) 25 MG tablet Take 37.5 mg by mouth 2 (two) times daily.    Yes Historical Provider, MD  Oxycodone HCl 10 MG TABS Take 10 mg by mouth every 4 (four) hours as needed (for pain).   Yes Historical Provider, MD  polyethylene glycol (MIRALAX / GLYCOLAX) packet Take 17 g by mouth daily. Mix with 4-6 oz liquid   Yes Historical Provider, MD  zolpidem (AMBIEN CR) 6.25 MG CR tablet Take 6.25 mg by mouth at bedtime as needed for sleep.   Yes Historical Provider, MD  promethazine (PHENERGAN) 25 MG tablet Take 25 mg by mouth 2 (two) times daily as needed for nausea or vomiting.    Historical Provider, MD   Physical  Exam: Blood pressure 159/60, pulse 89, temperature 97.6 F (36.4 C), temperature source Oral, resp. rate 17, height 5\' 4"  (1.626 m), weight 144.244 kg (318 lb), SpO2 97.00%. Filed Vitals:   04/07/14 1600 04/07/14 1611 04/07/14 1700 04/07/14 1754  BP:      Pulse: 75 79 75 89  Temp:      TempSrc:      Resp: 14 20 17 17   Height:      Weight:      SpO2: 97% 98% 100% 97%     General:  Obese BF, NAD  Eyes:  PERRL, anicteric, non-injected.  ENT:  Nares clear.  OP clear, non-erythematous without plaques or exudates.  MMM.  Neck:  Supple without TM or JVD.    Lymph:  No cervical, supraclavicular, or submandibular LAD.  Cardiovascular:  RRR, normal S1, S2, without m/r/g.  2+ pulses, warm extremities  Respiratory:  CTA bilaterally without increased WOB.  Abdomen:  NABS.  Soft, ND/NT.    Skin:  Left leg wound vac in place with serosanguinous drainage  Musculoskeletal:  Normal bulk and tone.  1+ RLE pitting edema, and 2+ LLE pitting edema with warmth, TTP posteriorly and along heel.  + pain with dorsiflexion of the left big toe.  No pain over insertion of the left achilles or achilles tendon  Psychiatric:  A & O x 4.  Appropriate affect.  Neurologic:  CN 3-12 intact.  5/5 strength.  Sensation intact.  Labs on Admission:  Basic Metabolic Panel: No results found for this basename: NA, K, CL, CO2, GLUCOSE, BUN, CREATININE, CALCIUM, MG, PHOS,  in the last 168 hours Liver Function Tests: No results found for this basename: AST, ALT, ALKPHOS, BILITOT, PROT, ALBUMIN,  in the last 168 hours No results found for this basename: LIPASE, AMYLASE,  in the last 168 hours No results found for this basename: AMMONIA,  in the last 168 hours CBC: No results found for this basename: WBC, NEUTROABS, HGB, HCT, MCV, PLT,  in the last 168 hours Cardiac Enzymes: No results found for this basename: CKTOTAL, CKMB, CKMBINDEX, TROPONINI,  in the last 168 hours BNP: No components found with this basename:  POCBNP,  CBG:  Recent Labs Lab 04/07/14 1042  GLUCAP 124*    Radiological Exams on Admission: No results found.  Time spent: 60 min  Janece Canterbury [facility] Pager [phone]  If 7PM-7AM, please contact night-coverage [email or web address] Password [facility] 04/07/2014, 6:09 PM

## 2014-04-08 ENCOUNTER — Encounter (HOSPITAL_COMMUNITY): Payer: Self-pay | Admitting: Plastic Surgery

## 2014-04-08 DIAGNOSIS — I2699 Other pulmonary embolism without acute cor pulmonale: Secondary | ICD-10-CM

## 2014-04-08 DIAGNOSIS — D638 Anemia in other chronic diseases classified elsewhere: Secondary | ICD-10-CM | POA: Diagnosis present

## 2014-04-08 DIAGNOSIS — E1129 Type 2 diabetes mellitus with other diabetic kidney complication: Secondary | ICD-10-CM

## 2014-04-08 DIAGNOSIS — L97109 Non-pressure chronic ulcer of unspecified thigh with unspecified severity: Secondary | ICD-10-CM | POA: Diagnosis not present

## 2014-04-08 LAB — CBC
HCT: 25.6 % — ABNORMAL LOW (ref 36.0–46.0)
Hemoglobin: 8.2 g/dL — ABNORMAL LOW (ref 12.0–15.0)
MCH: 30 pg (ref 26.0–34.0)
MCHC: 32 g/dL (ref 30.0–36.0)
MCV: 93.8 fL (ref 78.0–100.0)
PLATELETS: 231 10*3/uL (ref 150–400)
RBC: 2.73 MIL/uL — AB (ref 3.87–5.11)
RDW: 16.4 % — AB (ref 11.5–15.5)
WBC: 6 10*3/uL (ref 4.0–10.5)

## 2014-04-08 LAB — BASIC METABOLIC PANEL
Anion gap: 11 (ref 5–15)
BUN: 21 mg/dL (ref 6–23)
CO2: 22 mEq/L (ref 19–32)
Calcium: 8.2 mg/dL — ABNORMAL LOW (ref 8.4–10.5)
Chloride: 108 mEq/L (ref 96–112)
Creatinine, Ser: 1.21 mg/dL — ABNORMAL HIGH (ref 0.50–1.10)
GFR calc Af Amer: 52 mL/min — ABNORMAL LOW (ref >90)
GFR calc non Af Amer: 45 mL/min — ABNORMAL LOW (ref >90)
Glucose, Bld: 124 mg/dL — ABNORMAL HIGH (ref 70–99)
Potassium: 5.1 mEq/L (ref 3.7–5.3)
Sodium: 141 mEq/L (ref 137–147)

## 2014-04-08 LAB — GLUCOSE, CAPILLARY
GLUCOSE-CAPILLARY: 130 mg/dL — AB (ref 70–99)
Glucose-Capillary: 140 mg/dL — ABNORMAL HIGH (ref 70–99)
Glucose-Capillary: 121 mg/dL — ABNORMAL HIGH (ref 70–99)
Glucose-Capillary: 150 mg/dL — ABNORMAL HIGH (ref 70–99)

## 2014-04-08 LAB — MRSA PCR SCREENING: MRSA BY PCR: NEGATIVE

## 2014-04-08 LAB — URIC ACID: Uric Acid, Serum: 6.8 mg/dL (ref 2.4–7.0)

## 2014-04-08 MED ORDER — POLYETHYLENE GLYCOL 3350 17 G PO PACK
17.0000 g | PACK | Freq: Every day | ORAL | Status: DC
  Administered 2014-04-09: 17 g via ORAL
  Filled 2014-04-08: qty 1

## 2014-04-08 MED ORDER — HYDROCERIN EX CREA
TOPICAL_CREAM | Freq: Two times a day (BID) | CUTANEOUS | Status: DC
  Administered 2014-04-08 – 2014-04-09 (×3): 1 via TOPICAL
  Filled 2014-04-08: qty 113

## 2014-04-08 MED ORDER — PROMETHAZINE HCL 25 MG PO TABS: 25.0000 mg | ORAL_TABLET | Freq: Two times a day (BID) | ORAL | Status: DC | PRN

## 2014-04-08 MED ORDER — MAGNESIUM OXIDE 400 MG PO TABS
400.0000 mg | ORAL_TABLET | Freq: Every day | ORAL | Status: DC
  Administered 2014-04-08 – 2014-04-09 (×2): 400 mg via ORAL
  Filled 2014-04-08 (×2): qty 1

## 2014-04-08 NOTE — Progress Notes (Signed)
A consult was issued for a visit with the Chaplain. Chaplain visited pt and found her to be in a good mood. Pt was glad to see chaplain and requested prayer. Chaplain provided prayer and a listening ear to pt.   Charyl Dancer, Chaplain  04/08/14 1200  Clinical Encounter Type  Visited With Patient  Visit Type Spiritual support  Referral From Nurse  Consult/Referral To Chaplain  Spiritual Encounters  Spiritual Needs Prayer;Emotional  Stress Factors  Patient Stress Factors None identified  Family Stress Factors None identified

## 2014-04-08 NOTE — Evaluation (Signed)
Physical Therapy Evaluation Patient Details Name: Kelly [name] MRN: [id number] DOB: 09-06-1945 Today's Date: 04/08/2014   History of Present Illness  68 year old female with history of chronic diastolic heart failure, hypertension, hyperlipidemia, diabetes mellitus type 2, obstructive sleep apnea, GERD, depression, chronic lymphedema, and indwelling Foley catheter. She was admitted to [facility] from 6/15-6/25 for routine debridement of a chronic infected area of her left inner thigh by plastic surgery. Pt with multiple complications after surgery, including PE. Pt readmitted here at [facility] secondary to increase pain in Lt LE and inabilty to ambulate.Pt with chronic lymphodema and negative pressure wound VAC in place in Lt LE.   Clinical Impression  Pt adm due to above. Presents with decreased independence with functional mobility secondary to deficits indicated below. Pt to benefit from skilled acute PT to address deficits and maximize functional mobility prior to D/C to next venue. Pt limited in evaluation to EOB activities. Pt did not want to attempt transfers today but was very appreciative to sit up on EOB and perform exercises. Plans to return to [facility] and will benefit from post acute PT.    Follow Up Recommendations SNF;Supervision/Assistance - 24 hour    Equipment Recommendations  None recommended by PT    Recommendations for Other Services       Precautions / Restrictions Precautions Precautions: Fall Precaution Comments: multiple negative pressure VACs Restrictions Weight Bearing Restrictions: No      Mobility  Bed Mobility Overal bed mobility: Needs Assistance;+2 for physical assistance Bed Mobility: Rolling;Supine to Sit;Sit to Supine Rolling: Min assist   Supine to sit: +2 for physical assistance;Min assist;HOB elevated Sit to supine: +2 for physical assistance;Min assist   General bed mobility comments: pt able to control trunk to elevate to  supine but requires 2 person (A) to control hips and bring LEs off and back into bed; was able to roll with use of min (A) and handrails   Transfers                 General transfer comment: pt deferred; wanted to stay in bed for bath  Ambulation/Gait             General Gait Details: will assess next visit  Stairs            Wheelchair Mobility    Modified Rankin (Stroke Patients Only)       Balance Overall balance assessment: Needs assistance Sitting-balance support: Feet supported;Single extremity supported;No upper extremity supported Sitting balance-Leahy Scale: Fair Sitting balance - Comments: able to sit statically without UE for short periods of time; with exercises and other dyanmic challenges, required UE support; tolerated sitting EOB ~10 min; no c/o  dizziness                                     Pertinent Vitals/Pain Pain Assessment: No/denies pain    Home Living Family/patient expects to be discharged to:: Skilled nursing facility                 Additional Comments: Pt from [facility] and plans to return    Prior Function Level of Independence: Needs assistance   Gait / Transfers Assistance Needed: pt reports she could ambulate short distances in SNF with supervision (A) only until pain was incr then required incr (A) ; ambulates with RW  ADL's / Homemaking Assistance Needed: was able to transfer  on tub bench and bath herself at SNF prior to pain increasing  Comments: incr (A) needed for past few days due to pain in Lt LE     Hand Dominance        Extremity/Trunk Assessment   Upper Extremity Assessment: Defer to OT evaluation           Lower Extremity Assessment: Generalized weakness (c/o bil numbness/tingling in feet)      Cervical / Trunk Assessment: Other exceptions  Communication   Communication: No difficulties  Cognition Arousal/Alertness: Awake/alert Behavior During Therapy: WFL for tasks  assessed/performed Overall Cognitive Status: Within Functional Limits for tasks assessed                      General Comments      Exercises General Exercises - Lower Extremity Ankle Circles/Pumps: AROM;Both;15 reps;Seated Long Arc Quad: AROM;Strengthening;Both;10 reps;Seated Hip Flexion/Marching: AROM;Strengthening;Both;10 reps;Seated      Assessment/Plan    PT Assessment Patient needs continued PT services  PT Diagnosis Difficulty walking;Generalized weakness;Acute pain   PT Problem List Decreased strength;Decreased activity tolerance;Decreased balance;Decreased mobility;Decreased knowledge of use of DME;Pain;Obesity;Impaired sensation  PT Treatment Interventions DME instruction;Gait training;Functional mobility training;Therapeutic activities;Therapeutic exercise;Balance training;Neuromuscular re-education;Patient/family education   PT Goals (Current goals can be found in the Care Plan section) Acute Rehab PT Goals Patient Stated Goal: to get a bath PT Goal Formulation: With patient Time For Goal Achievement: 04/13/14 Potential to Achieve Goals: Fair    Frequency Min 2X/week   Barriers to discharge        Co-evaluation               End of Session Equipment Utilized During Treatment: Gait belt;Other (comment) (wound VACs ) Activity Tolerance: Patient limited by fatigue Patient left: in bed;with call bell/phone within reach Nurse Communication: Mobility status;Precautions         Time: 5397-6734 PT Time Calculation (min): 17 min   Charges:   PT Evaluation $Initial PT Evaluation Tier I: 1 Procedure PT Treatments $Therapeutic Activity: 8-22 mins   PT G CodesGustavus Bryant, [place]  [phone] 04/08/2014, 4:42 PM

## 2014-04-08 NOTE — Progress Notes (Signed)
PROGRESS NOTE  Kelly [name] STM:[id number] DOB: 05-28-1946 DOA: 04/07/2014 PCP: Alvester Chou, NP  The patient is a 68 year old female with history of chronic diastolic heart failure, hypertension, hyperlipidemia, diabetes mellitus type 2, obstructive sleep apnea, GERD, depression, chronic lymphedema, and indwelling Foley catheter. She was admitted to [facility] from 6/15-6/25 for routine debridement of a chronic infected area of her left inner thigh by plastic surgery. She underwent routine debridement and panic ulectomy, however her postoperative hospital course was complicated by urinary tract infection and pulmonary embolism. She was started on Lovenox and transitioned to Coumadin. She was readmitted on 6/28 with severe sepsis and hemorrhagic shock secondary to bleeding from her left leg ulcer. She required numerous blood transfusions and her anticoagulation was discontinued. On 6/30 she underwent IVC filter placement. She had debridement of that left ulcer and extraction of clot on 7/1, recurrent debridement on 7/8, 7/21, 7/30. During this time her anticoagulation was held. On 7/31 she was seen by Doctor Dellia Nims at [facility] because she was complaining of increased pain and swelling of her left leg. According to the notes, she underwent duplex of the left lower extremity however the results are not in the system and I was unable to reach staff at [facility] to obtain the test results. They were not sent with her with her medical records today. Dr. Dellia Nims Is out of town until August 15 and therefore cannot give history. According to the notes, she was supposed to start on Lovenox twice a day dosing for DVT treatment, however the patient reports that she has not been receiving Lovenox at [facility]. Records from [facility] do not list Lovenox on her medication list. She was readmitted on 8/12 for routine debridement and replacement of wound VAC to the left leg. She was reporting  that she has had worsening pain in her left leg starting at the heel and radiating up the posterior calf, 10 out of 10. Previously she was able to ambulate with minimal assistance from one person. Over the last 2 days, she has had to have 3 people assist her and she is unable to bear weight on the left heel.   Assessment/Plan:  Left lower extremity pain (acute heel pain) Xrays of the ankle are negative  Uric Acid is pending.  On allopurinol at home and started on colchicine. Bilateral Duplex is pending. Physical therapy evaluation pending.  Chronic lymphedema s/p recurrent I&D with wound vac placement (8/12) Dr. Migdalia Dk from Plastic Surgery reports ok for d/c from surgery stand point. Continued debridement next week.  No need to hold anticoagulation.  Acute pulmonary embolism dx 01/2014  s/p IVC filter 6/30  Resumed a/c with lovenox  Consider bridging to coumadin if no evidence of bleeding  CKD stage 3, creatinine at baseline   Minimize nephrotoxins and renally dose medications   Gout, stable, continue allopurinol, renally dosed  Check uric acid  Will start brief trial of colchicine in case there is a gouty component to her pain  Anemia of renal parenchymal disease/chronic disease Hgb down slightly post op.   Will monitor. Transfuse if she becomes symptomatic or drops to 7.0  [id number] with complication of diabetic neuropathy CBG stable, A1c 6.1 02/22/2014  Low dose SSI   Diabetic neuropathy Increase Gabapentin to 600mg  BID   DVT Prophylaxis:  lovenox  Code Status: full Family Communication:  Disposition Plan: to SNF when appropriate.   Consultants:  Dr. Migdalia Dk, [place].  Procedures:  none  Antibiotics: Anti-infectives   Start     Dose/Rate Route Frequency Ordered Stop   April 20, 2014 1403  polymyxin B 500,000 Units, bacitracin 50,000 Units in sodium chloride irrigation 0.9 % 500 mL irrigation  Status:  Discontinued       As needed 04/20/2014 1403 04-20-14 1422         HPI/Subjective: I'm looking forward to living.  She relays that in the last 2 years her mother, sister, and brother have passed.  Objective: Filed Vitals:   Apr 20, 2014 1830 04/20/14 2051 04/08/14 0525 04/08/14 1001  BP:  170/73 134/63 143/65  Pulse: 84 72 73 78  Temp: 98 F (36.7 C) 97.6 F (36.4 C) 98.3 F (36.8 C)   TempSrc:  Oral Oral   Resp: 17 16 16    Height:      Weight:      SpO2: 100% 98% 96%     Intake/Output Summary (Last 24 hours) at 04/08/14 1413 Last data filed at 04/08/14 4076  Gross per 24 hour  Intake      0 ml  Output    700 ml  Net   -700 ml   Filed Weights   2014-04-20 1046  Weight: 144.244 kg (318 lb)    Exam: General: Obese, female, extremely pleasant, lying in bed, face appears younger than stated age. HEENT:  PERR, EOMI, Anicteic Sclera, MMM. No pharyngeal erythema or exudates  Neck: Supple, no JVD, no masses  Cardiovascular: RRR, S1 S2 auscultated, no rubs, murmurs or gallops.   Respiratory: Clear to auscultation bilaterally with equal chest rise  Abdomen: obese, Soft, nontender, nondistended, + bowel sounds  Extremities: significant edema in LLE from thigh to dorsum of foot.  Wound vac in place.  Dry skin. Neuro: AAOx3, cranial nerves grossly intact. Strength 5/5 in upper and lower extremities  Skin: Without rashes exudates or nodules. Dry.  Psych: Normal affect and demeanor with intact judgement and insight   Data Reviewed: Basic Metabolic Panel:  Recent Labs Lab 04-20-2014 2126 04/08/14 0659  NA 143 141  K 5.3 5.1  CL 109 108  CO2 23 22  GLUCOSE 157* 124*  BUN 20 21  CREATININE 1.07 1.21*  CALCIUM 8.5 8.2*   CBC:  Recent Labs Lab 2014/04/20 2126 04/08/14 0659  WBC 5.8 6.0  HGB 9.4* 8.2*  HCT 30.0* 25.6*  MCV 90.9 93.8  PLT 215 231   CBG:  Recent Labs Lab 04/20/14 1042 04-20-14 2153 04/08/14 0755 04/08/14 1133  GLUCAP 124* 164* 130* 140*    Recent Results (from the past 240 hour(s))  MRSA PCR SCREENING      Status: None   Collection Time    04/08/14  1:40 AM      Result Value Ref Range Status   MRSA by PCR NEGATIVE  NEGATIVE Final   Comment:            The GeneXpert MRSA Assay (FDA     approved for NASAL specimens     only), is one component of a     comprehensive MRSA colonization     surveillance program. It is not     intended to diagnose MRSA     infection nor to guide or     monitor treatment for     MRSA infections.     Studies: Dg Ankle Complete Left  04-20-14   CLINICAL DATA:  Left heel pain radiating up into the left leg.  EXAM: LEFT ANKLE COMPLETE - 3+ VIEW  COMPARISON:  No  priors.  FINDINGS: Multiple views of the left ankle demonstrate no acute displaced fracture, subluxation, dislocation, or soft tissue abnormality. Enthesophytes are noted off the dorsal and plantar aspect of the calcaneus. Numerous vascular calcifications are noted. Diffuse soft tissue edema of the visualized lower extremity.  IMPRESSION: 1. No acute radiographic abnormality of the left ankle. 2. Plantar and dorsal calcaneal spurs. 3. Atherosclerosis.   Electronically Signed   By: Vinnie Langton M.D.   On: 04/07/2014 23:22    Scheduled Meds: . allopurinol  100 mg Oral Daily  . aspirin EC  81 mg Oral Daily  . atorvastatin  40 mg Oral q1800  . chlorthalidone  12.5 mg Oral Daily  . colchicine  0.6 mg Oral Daily  . docusate sodium  100 mg Oral BID  . enoxaparin (LOVENOX) injection  140 mg Subcutaneous Q12H  . gabapentin  600 mg Oral BID  . hydrocerin   Topical BID  . insulin aspart  0-9 Units Subcutaneous TID WC  . isosorbide mononitrate  10 mg Oral BID  . magnesium oxide  400 mg Oral Daily  . metoprolol tartrate  37.5 mg Oral BID  . pantoprazole  80 mg Oral QAC breakfast  . polyethylene glycol  17 g Oral BID  . polyethylene glycol  17 g Oral Daily  . senna  1 tablet Oral BID   Continuous Infusions:   Principal Problem:   Left leg pain Active Problems:   HYPERTENSION   Diabetes mellitus type 2  with neurological manifestations   Ulcer of lower limb, unspecified   Pulmonary embolism   Chronic kidney disease, stage 3   Unspecified constipation   Anemia of chronic disease    Karen Kitchens  [facility] Pager [phone]. If 7PM-7AM, please contact night-coverage at [email or web address], password [facility] 04/08/2014, 2:13 PM  LOS: 1 day    Attending -Patient was seen, examined,treatment plan was discussed with the Physician extender. I have directly reviewed the clinical findings, lab, imaging studies and management of this patient in detail. I have made the necessary changes to the above noted documentation, and agree with the documentation, as recorded by the Physician extender.  Nena Alexander MD [facility].

## 2014-04-09 ENCOUNTER — Other Ambulatory Visit: Payer: Self-pay | Admitting: Plastic Surgery

## 2014-04-09 DIAGNOSIS — E1149 Type 2 diabetes mellitus with other diabetic neurological complication: Secondary | ICD-10-CM

## 2014-04-09 DIAGNOSIS — S81802S Unspecified open wound, left lower leg, sequela: Principal | ICD-10-CM

## 2014-04-09 DIAGNOSIS — S91002S Unspecified open wound, left ankle, sequela: Principal | ICD-10-CM

## 2014-04-09 DIAGNOSIS — S81002S Unspecified open wound, left knee, sequela: Secondary | ICD-10-CM

## 2014-04-09 DIAGNOSIS — R609 Edema, unspecified: Secondary | ICD-10-CM

## 2014-04-09 DIAGNOSIS — M79609 Pain in unspecified limb: Secondary | ICD-10-CM

## 2014-04-09 DIAGNOSIS — I2699 Other pulmonary embolism without acute cor pulmonale: Secondary | ICD-10-CM

## 2014-04-09 DIAGNOSIS — I10 Essential (primary) hypertension: Secondary | ICD-10-CM

## 2014-04-09 LAB — HEPARIN ANTI-XA: HEPARIN LMW: 1.26 [IU]/mL

## 2014-04-09 LAB — GLUCOSE, CAPILLARY
GLUCOSE-CAPILLARY: 94 mg/dL (ref 70–99)
Glucose-Capillary: 145 mg/dL — ABNORMAL HIGH (ref 70–99)

## 2014-04-09 MED ORDER — INSULIN ASPART 100 UNIT/ML ~~LOC~~ SOLN: 0.0000 [IU] | Freq: Three times a day (TID) | SUBCUTANEOUS | Status: DC

## 2014-04-09 MED ORDER — DSS 100 MG PO CAPS: 100.0000 mg | ORAL_CAPSULE | Freq: Two times a day (BID) | ORAL | Status: DC

## 2014-04-09 MED ORDER — HYDROCERIN EX CREA: 1.0000 "application " | TOPICAL_CREAM | Freq: Two times a day (BID) | CUTANEOUS | Status: DC

## 2014-04-09 MED ORDER — ACETAMINOPHEN 325 MG PO TABS: 650.0000 mg | ORAL_TABLET | Freq: Four times a day (QID) | ORAL | Status: DC | PRN

## 2014-04-09 MED ORDER — OXYCODONE HCL 10 MG PO TABS: 10.0000 mg | ORAL_TABLET | ORAL | Status: DC | PRN

## 2014-04-09 MED ORDER — IRON POLYSACCH CMPLX-B12-FA 150-0.025-1 MG PO CAPS: 1.0000 | ORAL_CAPSULE | Freq: Every morning | ORAL | Status: DC

## 2014-04-09 MED ORDER — GABAPENTIN 300 MG PO CAPS: 600.0000 mg | ORAL_CAPSULE | Freq: Two times a day (BID) | ORAL | Status: DC

## 2014-04-09 MED ORDER — ENOXAPARIN SODIUM 150 MG/ML ~~LOC~~ SOLN: 140.0000 mg | Freq: Two times a day (BID) | SUBCUTANEOUS | Status: DC

## 2014-04-09 MED ORDER — ZOLPIDEM TARTRATE ER 6.25 MG PO TBCR: 6.2500 mg | EXTENDED_RELEASE_TABLET | Freq: Every evening | ORAL | Status: DC | PRN

## 2014-04-09 MED ORDER — SENNA 8.6 MG PO TABS: 1.0000 | ORAL_TABLET | Freq: Two times a day (BID) | ORAL | Status: AC

## 2014-04-09 NOTE — Care Management Note (Signed)
    Page 1 of 1   04/09/2014     11:34:26 AM CARE MANAGEMENT NOTE 04/09/2014  Patient:  Kelly [name], Kelly [name]   Account Number:  [id number]  Date Initiated:  04/09/2014  Documentation initiated by:  Tomi Bamberger  Subjective/Objective Assessment:   dx  open wound of left leg with wound vac  admit- from [place] with wound vac.     Action/Plan:   Anticipated DC Date:  04/09/2014   Anticipated DC Plan:  SKILLED NURSING FACILITY  In-house referral  Clinical Social Worker      DC Planning Services  CM consult      Choice offered to / List presented to:             Status of service:  Completed, signed off Medicare Important Message given?  NA - LOS <3 / Initial given by admissions (If response is "NO", the following Medicare IM given date fields will be blank) Date Medicare IM given:   Medicare IM given by:   Date Additional Medicare IM given:   Additional Medicare IM given by:    Discharge Disposition:  [place]  Per UR Regulation:  Reviewed for med. necessity/level of care/duration of stay  If discussed at [place] of Stay Meetings, dates discussed:    Comments:  04/09/14 [place], BSN [phone] patient for dc to snf back to [facility] with wound vac, patient came to hospital with wound vac so it should go back with patient, CSW aware.

## 2014-04-09 NOTE — Clinical Social Work Placement (Addendum)
Per MD patient ready to DC back to [place], patient/family (Kelly [name]), and facility notified of patient's DC. RN given number for report. DC packet on patient's chart. Ambulance transport requested for patient. CSW signing off at this time.  Liz Beach MSW, [place], [place], [phone]

## 2014-04-09 NOTE — Clinical Social Work Placement (Signed)
Clinical Social Work Department BRIEF PSYCHOSOCIAL ASSESSMENT 04/09/2014  Patient:  Kelly [name], Kelly [name]     Account Number:  [id number]     Admit date:  04/07/2014  Clinical Social Worker:  Lovey Newcomer  Date/Time:  04/09/2014 02:16 PM  Referred by:  Physician  Date Referred:  04/09/2014 Referred for  SNF Placement   Other Referral:   Interview type:  Patient Other interview type:   Patient alert and oriented at time of assessment.    PSYCHOSOCIAL DATA Living Status:  FACILITY Admitted from facility:  [facility] Level of care:  [place] Primary support name:  Kelly [name] Primary support relationship to patient:  FAMILY Degree of support available:   Support is fair.    CURRENT CONCERNS Current Concerns  Post-Acute Placement   Other Concerns:    SOCIAL WORK ASSESSMENT / PLAN CSW met with patient at bedside to complete assessment. Patient states she has been at [facility] for over a month and confirms that the plan is for her to return to [facility] at discharge. Patient states that she is happy with the care she receives at the facility. Patient requests that CSW contact either niece or nephew regarding her return to [facility]. Patient appears and calm and content.   Assessment/plan status:  Psychosocial Support/Ongoing Assessment of Needs Other assessment/ plan:   COmplete Fl2 , Fax, PASRR   Information/referral to community resources:   CSW contact information given.    PATIENT'S/FAMILY'S RESPONSE TO PLAN OF CARE: Patient states that she plans to return to [facility] at discharge. CSW will assist.       Liz Beach MSW, [place], [place], [phone]

## 2014-04-09 NOTE — Progress Notes (Signed)
NURSING PROGRESS NOTE  Kelly [name] [id number] Discharge Data: 04/09/2014 4:37 PM Attending Provider: Jonetta Osgood, MD MKL:KJZP, [name], NP     Hassel Neth to be D/C'd Skilled nursing facility per MD order.  Discussed with the patient the After Visit Summary and all questions fully answered. All IV's discontinued with no bleeding noted. All belongings returned to patient for patient to take home.   Last Vital Signs:  Blood pressure 159/66, pulse 57, temperature 98 F (36.7 C), temperature source Oral, resp. rate 18, height 5\' 4"  (1.626 m), weight 144.244 kg (318 lb), SpO2 97.00%.  Discharge Medication List   Medication List    STOP taking these medications       aspirin EC 325 MG tablet      TAKE these medications       acetaminophen 325 MG tablet  Commonly known as:  TYLENOL  Take 2 tablets (650 mg total) by mouth every 6 (six) hours as needed for mild pain (or Fever >/= 101).     allopurinol 100 MG tablet  Commonly known as:  ZYLOPRIM  Take 100 mg by mouth daily.     atorvastatin 40 MG tablet  Commonly known as:  LIPITOR  Take 40 mg by mouth daily.     chlorthalidone 25 MG tablet  Commonly known as:  HYGROTON  Take 12.5 mg by mouth daily.     DSS 100 MG Caps  Take 100 mg by mouth 2 (two) times daily.     enoxaparin 150 MG/ML injection  Commonly known as:  LOVENOX  Inject 0.93 mLs (140 mg total) into the skin every 12 (twelve) hours.     esomeprazole 40 MG capsule  Commonly known as:  NEXIUM  Take 40 mg by mouth daily before breakfast. Take on an empty stomach     gabapentin 300 MG capsule  Commonly known as:  NEURONTIN  Take 2 capsules (600 mg total) by mouth 2 (two) times daily.     hydrocerin Crea  Apply 1 application topically 2 (two) times daily.     insulin aspart 100 UNIT/ML injection  Commonly known as:  novoLOG  Inject 0-9 Units into the skin 3 (three) times daily with meals.     Iron Polysacch Cmplx-B12-FA 150-0.025-1 MG Caps  Take 1  tablet by mouth every morning.     isosorbide mononitrate 10 MG tablet  Commonly known as:  ISMO,MONOKET  Take 10 mg by mouth 2 (two) times daily.     magnesium oxide 400 MG tablet  Commonly known as:  MAG-OX  Take 400 mg by mouth daily.     metoprolol tartrate 25 MG tablet  Commonly known as:  LOPRESSOR  Take 37.5 mg by mouth 2 (two) times daily.     Oxycodone HCl 10 MG Tabs  Take 1 tablet (10 mg total) by mouth every 4 (four) hours as needed (for pain).     polyethylene glycol packet  Commonly known as:  MIRALAX / GLYCOLAX  Take 17 g by mouth daily. Mix with 4-6 oz liquid     promethazine 25 MG tablet  Commonly known as:  PHENERGAN  Take 25 mg by mouth 2 (two) times daily as needed for nausea or vomiting.     senna 8.6 MG Tabs tablet  Commonly known as:  SENOKOT  Take 1 tablet (8.6 mg total) by mouth 2 (two) times daily.     zolpidem 6.25 MG CR tablet  Commonly known as:  AMBIEN CR  Take 1 tablet (6.25 mg total) by mouth at bedtime as needed for sleep.

## 2014-04-09 NOTE — Progress Notes (Signed)
Bilateral lower extremity venous duplex completed:  No obvious evidence of DVT, superficial thrombosis, or Baker's cyst.  Technically difficult study due to the patient's body habitus.   

## 2014-04-09 NOTE — Discharge Summary (Signed)
Physician Discharge Summary  Kelly [name] KGU:[id number] DOB: 12/03/1945 DOA: 04/07/2014  PCP: Alvester Chou, NP  Admit date: 04/07/2014 Discharge date: 04/09/2014  Time spent: 60  minutes  Recommendations for Outpatient Follow-up:  1. CBC / BMET on Monday 8/17 2. Patient will be on lovenox twice a day for anticoagulation until she can be safely transitioned to coumadin or a novel anticoag-once all surgical debridement is complete 3. Discharging to [facility] on 8/14  4. Will continue Wound Vac and Wound Care 5. Follow up with Dr. Theodoro Kos during the week of 8/17. 6. Please follow up on the final results of the bilateral lower extremity dopplers completed 04/09/2014. 7. Recommend Orthopedic evaluation of left foot pain outpatient as needed.  Discharge Diagnoses:  Principal Problem:   Left leg pain Active Problems:   HYPERTENSION   Diabetes mellitus type 2 with neurological manifestations   Ulcer of lower limb, unspecified   Pulmonary embolism   Chronic kidney disease, stage 3   Unspecified constipation   Anemia of chronic disease   Discharge Condition: stable.   Diet recommendation: Heart Healthy  Filed Weights   04/07/14 1046  Weight: 144.244 kg (318 lb)    History of present illness:  The patient is a 68 year old female with history of chronic diastolic heart failure, hypertension, hyperlipidemia, diabetes mellitus type 2, obstructive sleep apnea, GERD, depression, chronic lymphedema, and indwelling Foley catheter. She was admitted to [facility] from 6/15-6/25 for routine debridement of a chronic infected area of her left inner thigh by plastic surgery. She underwent routine debridement and paniculectomy, however her postoperative hospital course was complicated by urinary tract infection and pulmonary embolism. She was started on Lovenox and transitioned to Coumadin. She was readmitted on 6/28 with severe sepsis and hemorrhagic shock secondary due  to bleeding from her left leg ulcer. She required numerous blood transfusions and her anticoagulation was discontinued. On 6/30 she underwent IVC filter placement. She had debridement of the left thigh ulcer and extraction of clot on 7/1, recurrent debridement on 7/8, 7/21, 7/30. During this time her anticoagulation was held. On 7/31 she was seen by Doctor Dellia Nims at [facility] growth because she was complaining of increased pain and swelling of her left leg. According to the notes, she underwent duplex of the left lower extremity however the results are not in the system and I was unable to reach staff at [facility] to obtain the test results. They were not sent with her with her medical records today. Dr. Dellia Nims Is out of town until August 15 and therefore cannot give history. According to the notes, she was supposed to start on Lovenox twice a day dosing for DVT treatment, however the patient reports that she has not been receiving Lovenox at [facility]. Records from [facility] do not list Lovenox on her medication list. She was readmitted on 8/12 for routine debridement and replacement of wound VAC to the left leg. She was reporting that she has had worsening pain in her left leg starting at the heel and radiating up the posterior calf, 10 out of 10. Previously she was able to ambulate with minimal assistance from one person. Over the last 2 days, she has had to have 3 people assist her and she is unable to bear weight on the left heel.   Hospital Course:  Left lower extremity pain (acute heel pain)  Unable to bear weight on the left foot on the day of admission.No fever, or erythema/cellulitic  changes seen on exam. Xrays of the ankle are negative,Uric Acid is WNL. Allopurinol continued.Bilateral Duplex negative for DVT-but poor study given body habitus.  Better with supportive care-now able to put some weight-and press down on hand by day of discharge, suspect since slowly improving, further work up can be  done in the outpatient setting while at [facility].If pain persists, may need a orthopedic eval at some point  Chronic lymphedema s/p recurrent I&D with wound vac placement (8/12)  Dr. Migdalia Dk from Plastic Surgery reports ok for d/c from surgery stand point. Continued debridement next week (week of 8/17). No need to hold anticoagulation.   Acute pulmonary embolism dx 01/2014  Recent hemorrhagic shock for bleeding of the surgical site on the left thigh (June 2015) s/p IVC filter 6/30  Resumed a/c with lovenox  Consider bridging to coumadin or other novel anticoagulant-once all surgical debridement is complete  CKD stage 3, creatinine at baseline  Stable. Minimize nephrotoxins and renally dose medications   Gout, stable, continue allopurinol, renally dosed  Stable.  Uric Acid level is normal.   Anemia of renal parenchymal disease/chronic disease  Hgb down slightly post op-but stable at 8.2-near baseline. Continue intermittent monitoring outpatient. Started iron polysaccharide (rather than ferrous sulfate as patient has chronic constipation)  X3AT with complication of diabetic neuropathy  CBG stable, A1c 6.1 02/22/2014  Low dose SSI   Diabetic neuropathy  Increase Gabapentin to 600mg  BID   Procedures:  8/12 I & D of left thigh just prior to admission.  Consultations:  none  Discharge Exam: Filed Vitals:   04/09/14 0617  BP: 151/64  Pulse: 62  Temp: 98.2 F (36.8 C)  Resp: 62   Discharge Instructions       Discharge Instructions   Diet - low sodium heart healthy    Complete by:  As directed      Increase activity slowly    Complete by:  As directed             Medication List    STOP taking these medications       aspirin EC 325 MG tablet      TAKE these medications       acetaminophen 325 MG tablet  Commonly known as:  TYLENOL  Take 2 tablets (650 mg total) by mouth every 6 (six) hours as needed for mild pain (or Fever >/= 101).     allopurinol 100 MG  tablet  Commonly known as:  ZYLOPRIM  Take 100 mg by mouth daily.     atorvastatin 40 MG tablet  Commonly known as:  LIPITOR  Take 40 mg by mouth daily.     chlorthalidone 25 MG tablet  Commonly known as:  HYGROTON  Take 12.5 mg by mouth daily.     DSS 100 MG Caps  Take 100 mg by mouth 2 (two) times daily.     enoxaparin 150 MG/ML injection  Commonly known as:  LOVENOX  Inject 0.93 mLs (140 mg total) into the skin every 12 (twelve) hours.     esomeprazole 40 MG capsule  Commonly known as:  NEXIUM  Take 40 mg by mouth daily before breakfast. Take on an empty stomach     gabapentin 300 MG capsule  Commonly known as:  NEURONTIN  Take 2 capsules (600 mg total) by mouth 2 (two) times daily.     hydrocerin Crea  Apply 1 application topically 2 (two) times daily.     insulin aspart 100 UNIT/ML injection  Commonly known as:  novoLOG  Inject 0-9 Units into the skin 3 (three) times daily with meals.     Iron Polysacch Cmplx-B12-FA 150-0.025-1 MG Caps  Take 1 tablet by mouth every morning.     isosorbide mononitrate 10 MG tablet  Commonly known as:  ISMO,MONOKET  Take 10 mg by mouth 2 (two) times daily.     magnesium oxide 400 MG tablet  Commonly known as:  MAG-OX  Take 400 mg by mouth daily.     metoprolol tartrate 25 MG tablet  Commonly known as:  LOPRESSOR  Take 37.5 mg by mouth 2 (two) times daily.     Oxycodone HCl 10 MG Tabs  Take 1 tablet (10 mg total) by mouth every 4 (four) hours as needed (for pain).     polyethylene glycol packet  Commonly known as:  MIRALAX / GLYCOLAX  Take 17 g by mouth daily. Mix with 4-6 oz liquid     promethazine 25 MG tablet  Commonly known as:  PHENERGAN  Take 25 mg by mouth 2 (two) times daily as needed for nausea or vomiting.     senna 8.6 MG Tabs tablet  Commonly known as:  SENOKOT  Take 1 tablet (8.6 mg total) by mouth 2 (two) times daily.     zolpidem 6.25 MG CR tablet  Commonly known as:  AMBIEN CR  Take 1 tablet (6.25 mg  total) by mouth at bedtime as needed for sleep.       Allergies  Allergen Reactions  . Bactrim [Sulfamethoxazole-Tmp Ds] Other (See Comments)    Hyperkalemia (July 2015)  . Penicillins Hives   Follow-up Information   Follow up with SANGER,CLAIRE, DO In 1 week.   Specialty:  Plastic Surgery   Contact information:   [place] [address] [phone]        The results of significant diagnostics from this hospitalization (including imaging, microbiology, ancillary and laboratory) are listed below for reference.    Significant Diagnostic Studies: Dg Ankle Complete Left  2014/04/24   CLINICAL DATA:  Left heel pain radiating up into the left leg.  EXAM: LEFT ANKLE COMPLETE - 3+ VIEW  COMPARISON:  No priors.  FINDINGS: Multiple views of the left ankle demonstrate no acute displaced fracture, subluxation, dislocation, or soft tissue abnormality. Enthesophytes are noted off the dorsal and plantar aspect of the calcaneus. Numerous vascular calcifications are noted. Diffuse soft tissue edema of the visualized lower extremity.  IMPRESSION: 1. No acute radiographic abnormality of the left ankle. 2. Plantar and dorsal calcaneal spurs. 3. Atherosclerosis.   Electronically Signed   By: Vinnie Langton M.D.   On: April 24, 2014 23:22    Microbiology: Recent Results (from the past 240 hour(s))  MRSA PCR SCREENING     Status: None   Collection Time    04/08/14  1:40 AM      Result Value Ref Range Status   MRSA by PCR NEGATIVE  NEGATIVE Final   Comment:            The GeneXpert MRSA Assay (FDA     approved for NASAL specimens     only), is one component of a     comprehensive MRSA colonization     surveillance program. It is not     intended to diagnose MRSA     infection nor to guide or     monitor treatment for     MRSA infections.     Labs: Basic Metabolic Panel:  Recent Labs Lab 04/07/14 2126 04/08/14 0659  NA 143 141  K 5.3 5.1  CL 109 108  CO2 23 22   GLUCOSE 157* 124*  BUN 20 21  CREATININE 1.07 1.21*  CALCIUM 8.5 8.2*   CBC:  Recent Labs Lab 04/07/14 2126 04/08/14 0659  WBC 5.8 6.0  HGB 9.4* 8.2*  HCT 30.0* 25.6*  MCV 90.9 93.8  PLT 215 231   CBG:  Recent Labs Lab 04/08/14 1133 04/08/14 1652 04/08/14 2118 04/09/14 0755 04/09/14 1131  GLUCAP 140* 121* 150* 94 145*    Signed:  Karen Kitchens [phone]  [facility] 04/09/2014, 1:31 PM  Attending Patient was seen, examined,treatment plan was discussed with the Physician extender. I have directly reviewed the clinical findings, lab, imaging studies and management of this patient in detail. I have made the necessary changes to the above noted documentation, and agree with the documentation, as recorded by the Physician extender.  Nena Alexander MD [facility].

## 2014-04-12 ENCOUNTER — Non-Acute Institutional Stay (SKILLED_NURSING_FACILITY): Payer: Medicare Other | Admitting: Internal Medicine

## 2014-04-12 ENCOUNTER — Other Ambulatory Visit: Payer: Self-pay

## 2014-04-12 DIAGNOSIS — E0843 Diabetes mellitus due to underlying condition with diabetic autonomic (poly)neuropathy: Secondary | ICD-10-CM

## 2014-04-12 DIAGNOSIS — N183 Chronic kidney disease, stage 3 unspecified: Secondary | ICD-10-CM

## 2014-04-12 DIAGNOSIS — E1349 Other specified diabetes mellitus with other diabetic neurological complication: Secondary | ICD-10-CM

## 2014-04-12 DIAGNOSIS — G909 Disorder of the autonomic nervous system, unspecified: Secondary | ICD-10-CM

## 2014-04-12 DIAGNOSIS — E1129 Type 2 diabetes mellitus with other diabetic kidney complication: Secondary | ICD-10-CM

## 2014-04-12 DIAGNOSIS — I150 Renovascular hypertension: Secondary | ICD-10-CM

## 2014-04-12 MED ORDER — ZOLPIDEM TARTRATE ER 6.25 MG PO TBCR: 6.2500 mg | EXTENDED_RELEASE_TABLET | Freq: Every evening | ORAL | Status: DC | PRN

## 2014-04-12 NOTE — Telephone Encounter (Signed)
Rx faxed to [facility] @ [phone], phone number [phone]  

## 2014-04-12 NOTE — Progress Notes (Signed)
HISTORY & PHYSICAL  DATE: 04/12/2014   FACILITY: [facility]  LEVEL OF CARE: SNF (31)  ALLERGIES:  Allergies  Allergen Reactions  . Bactrim [Sulfamethoxazole-Tmp Ds] Other (See Comments)    Hyperkalemia (July 2015)  . 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 After hospitalization she is to be admitted back to the facility for wound care and short-term rehabilitation.  CHRONIC KIDNEY DISEASE: The patient's chronic kidney disease remains stable.  Patient denies increasing lower extremity swelling or confusion. Last BUN and creatinine are: 21, 1.21.  DM:pt's DM remains stable.  Pt denies polyuria, polydipsia, polyphagia, changes in vision or hypoglycemic episodes.  No complications noted from the medication presently being used.  Last hemoglobin A1c is: 6.1 in 6/15.  HTN: Pt 's HTN remains stable.  Denies CP, sob, DOE, headaches, dizziness or visual disturbances.  No complications from the medications currently being used. Complains of bilateral lower extremity swelling.  Last BP : 132/60.  PAST MEDICAL HISTORY :  Past Medical History  Diagnosis Date  . Hypertension   . Hyperlipidemia   . CHF (congestive heart failure)   . GERD (gastroesophageal reflux disease)   . Depression   . Lymphedema of leg     left leg  . Pulmonary embolism 02/18/14    diagnosed at [facility] with CT angio chest  . Chronic indwelling Foley catheter   . Heart murmur     "just found out I have one" (04/07/2014)  . DVT (deep venous thrombosis) 01/2014    LLE; "went from my leg to my lungs"  . Pneumonia 1990's X 2  . Sleep apnea     doesn't use cpap machine or 02 at night (04/07/2014)  . Diabetes mellitus     "at one time" (04/07/2014)  . Anemia   . History of blood  transfusion 1990's X 2    "when they did my surgeries"  . Migraines     "none for years now" (04/07/2014)  . Arthritis     "arms, legs" (04/07/2014)  . Gout   . Urinary incontinence   . Chronic kidney disease (CKD), stage III (moderate)     [name] 04/07/2014    PAST SURGICAL HISTORY: Past Surgical History  Procedure Laterality Date  . Shoulder open rotator cuff repair Right ~ 2000  . Knee arthroscopy Right ?1980's  . Temporal artery biopsy / ligation Right ?1990's  . Colonoscopy with propofol  09/23/2012    Procedure: COLONOSCOPY WITH PROPOFOL;  Surgeon: Jerene Bears, MD;  Location: [facility];  Service: Gastroenterology;  Laterality: N/A;  . Panniculectomy Left 02/08/2014    w/wound debridement @ medial thigh  . Irrigation and debridement abscess Left 02/24/2014    Procedure: MINOR INCISION AND DRAINAGE OF ABSCESS;  Surgeon: Theodoro Kos, DO;  Location: [facility];  Service: Plastics;  Laterality: Left;  . I&d extremity Left 03/03/2014    Procedure: IRRIGATION AND DEBRIDEMENT LEFT LEG WOUND AND PLACEMENT OF A CELL AND VAC;  Surgeon: Theodoro Kos, DO;  Location: [place];  Service: Plastics;  Laterality: Left;  . Incision and drainage of wound Left 03/16/2014    Procedure: IRRIGATION AND DEBRIDEMENT OF LEFT THIGH WOUND, APPLICATION ACELL;  Surgeon: Irene Limbo, MD;  Location: [place];  Service: Plastics;  Laterality: Left;  .  Incision and drainage of wound Left 03/25/2014    Procedure: IRRIGATION AND DEBRIDEMENT OF LEFT LEG WOUND WITH PLACEMENT OF ACELL/VAC;  Surgeon: Theodoro Kos, DO;  Location: [place];  Service: Plastics;  Laterality: Left;  . Incision and drainage of wound Left 03/31/2014    Procedure: IRRIGATION AND DEBRIDEMENT LEFT LEG WOUND WITH PLACEMENT OF A-CELL/VAC;  Surgeon: Theodoro Kos, DO;  Location: [place];  Service: Plastics;  Laterality: Left;  . Cataract extraction w/ intraocular lens implant Right 2014  . Vena cava filter placement  02/2014  . Incision and drainage of wound  Left 04/07/2014    Procedure: IRRIGATION AND DEBRIDEMENT LEFT LEG WOUND WITH PLACEMENT OF A CELL AND VAC;  Surgeon: Theodoro Kos, DO;  Location: [place];  Service: Plastics;  Laterality: Left;    SOCIAL HISTORY:  reports that she quit smoking about 32 years ago. Her smoking use included Cigarettes. She has a 33 pack-year smoking history. She has never used smokeless tobacco. She reports that she drinks alcohol. She reports that she does not use illicit drugs.  FAMILY HISTORY:  Family History  Problem Relation Age of Onset  . Emphysema Father   . Asthma Brother   . Heart disease Mother   . Stomach cancer Brother   . Heart disease Maternal Grandmother   . Diabetes Mother   . Diabetes Sister     CURRENT MEDICATIONS: Reviewed per MAR/see medication list  REVIEW OF SYSTEMS:  See HPI otherwise 14 point ROS is negative.  PHYSICAL EXAMINATION  VS:  See VS section  GENERAL: no acute distress, morbidly obese body habitus EYES: conjunctivae normal, sclerae normal, normal eye lids MOUTH/THROAT: lips without lesions,no lesions in the mouth,tongue is without lesions,uvula elevates in midline NECK: supple, trachea midline, no neck masses, no thyroid tenderness, no thyromegaly LYMPHATICS: no LAN in the neck, no supraclavicular LAN RESPIRATORY: breathing is even & unlabored, BS CTAB CARDIAC: RRR, no murmur,no extra heart sounds, +3 bilateral lower extremity edema GI:  ABDOMEN: abdomen soft, normal BS, no masses, no tenderness  LIVER/SPLEEN: no hepatomegaly, no splenomegaly MUSCULOSKELETAL: HEAD: normal to inspection  EXTREMITIES: LEFT UPPER EXTREMITY: full range of motion, normal strength & tone RIGHT UPPER EXTREMITY:  full range of motion, normal strength & tone LEFT LOWER EXTREMITY: Minimal range of motion, decreased strength & tone RIGHT LOWER EXTREMITY: Minimal range of motion, decreased strength & tone PSYCHIATRIC: the patient is alert & oriented to person, affect & behavior  appropriate  LABS/RADIOLOGY:  Labs reviewed: Basic Metabolic Panel:  Recent Labs  05/02/13 0445  03/31/14 1301 04/07/14 2126 04/08/14 0659  NA 139  < > 139 143 141  K 3.7  < > 4.7 5.3 5.1  CL 104  < > 106 109 108  CO2 29  < > 21 23 22   GLUCOSE 121*  < > 124* 157* 124*  BUN 75*  < > 21 20 21   CREATININE 2.65*  < > 1.18* 1.07 1.21*  CALCIUM 8.4  < > 8.4 8.5 8.2*  MG 1.7  --   --   --   --   PHOS 4.4  --   --   --   --   < > = values in this interval not displayed. Liver Function Tests:  Recent Labs  05/01/13 1900 02/20/14 2335  AST 57* 23  ALT 60* 25  ALKPHOS 158* 153*  BILITOT 0.4 0.2*  PROT 7.1 7.0  ALBUMIN 2.6* 1.9*    Recent Labs  05/01/13 1900  LIPASE 13  CBC:  Recent Labs  05/01/13 1900  02/20/14 2335  03/12/14 0800  03/31/14 1301 04/07/14 2126 04/08/14 0659  WBC 5.4  < > 13.3*  < > 6.2  < > 7.1 5.8 6.0  NEUTROABS 3.6  --  12.2*  --  3.6  --   --   --   --   HGB 10.1*  < > 9.0*  < > 8.8*  < > 9.4* 9.4* 8.2*  HCT 31.1*  < > 28.0*  < > 27.6*  < > 29.8* 30.0* 25.6*  MCV 85.9  < > 89.7  < > 92.0  < > 92.8 90.9 93.8  PLT 181  < > 370  < > 278  < > 208 215 231  < > = values in this interval not displayed.  CBG:  Recent Labs  04/08/14 2118 04/09/14 0755 04/09/14 1131  GLUCAP 150* 94 145*    Transthoracic Echocardiography  Patient:    Kelly [name], Kelly [name] MR #:       [id number] Study Date: 11/28/2012 Gender:     F Age:        75 Height:     165.1cm Weight:     168.2kg BSA:        2.68m^2 Pt. Status: Room:    ORDERING     Nahser, Jasmine Awe    Nahser, Kathlen Mody  SONOGRAPHER  Cindy Hazy, RDCS  PERFORMING   [facility], [facility] cc:  ------------------------------------------------------------ LV EF: 55% -   60%  ------------------------------------------------------------ Indications:      CHF - 428.0.  ------------------------------------------------------------ History:   PMH:  Acquired from  the patient and from the patient's chart.  PMH:  CHF. Pulmonary hypertension. Acute Renal Failure. Lower extremity edema. Palpitations. Chest pain. Obesity hypoventilation syndrome. Sleep apnea-CPAP. Risk factors:  Former tobacco use. Hypertension. Diabetes mellitus. Morbidly obese. Dyslipidemia.  ------------------------------------------------------------ Study Conclusions  - Procedure narrative: Transthoracic echocardiography for   left ventricular function evaluation. Image quality was   adequate. The study was technically difficult. - Left ventricle: The cavity size was normal. Wall thickness   was increased in a pattern of mild LVH. Systolic function   was normal. The estimated ejection fraction was in the   range of 55% to 60%. Wall motion was normal; there were no   regional wall motion abnormalities. Doppler parameters are   consistent with abnormal left ventricular relaxation   (grade 1 diastolic dysfunction). - Left atrium: The atrium was mildly dilated. Transthoracic echocardiography.  M-mode, complete 2D, spectral Doppler, and color Doppler.  Height:  Height: 165.1cm. Height: 65in.  Weight:  Weight: 168.2kg. Weight: 370lb.  Body mass index:  BMI: 61.7kg/m^2.  Body surface area:    BSA: 2.26m^2.  Blood pressure:     134/76.  Patient status:  Outpatient.  Location:  [facility]  ------------------------------------------------------------  ------------------------------------------------------------ Left ventricle:  The cavity size was normal. Wall thickness was increased in a pattern of mild LVH. Systolic function was normal. The estimated ejection fraction was in the range of 55% to 60%. Wall motion was normal; there were no regional wall motion abnormalities. Doppler parameters are consistent with abnormal left ventricular relaxation (grade 1 diastolic dysfunction).  ------------------------------------------------------------ Aortic valve:   Trileaflet;  mildly thickened leaflets. Mobility was not restricted.  Doppler:  Transvalvular velocity was within the normal range. There was no stenosis.  No regurgitation.  ------------------------------------------------------------ Aorta:  Aortic root: The aortic root was normal in  size.  ------------------------------------------------------------ Mitral valve:   Structurally normal valve.   Mobility was not restricted.  Doppler:  Transvalvular velocity was within the normal range. There was no evidence for stenosis.  No regurgitation.    Peak gradient: 39mm Hg (D).  ------------------------------------------------------------ Left atrium:  The atrium was mildly dilated.  ------------------------------------------------------------ Right ventricle:  The cavity size was normal. Systolic function was normal.  ------------------------------------------------------------ Pulmonic valve:    Doppler:  Transvalvular velocity was within the normal range. There was no evidence for stenosis.   ------------------------------------------------------------ Tricuspid valve:   Structurally normal valve.    Doppler: Transvalvular velocity was within the normal range.  Trivial regurgitation.  ------------------------------------------------------------ Right atrium:  The atrium was normal in size.  ------------------------------------------------------------ Pericardium:  There was no pericardial effusion.  ------------------------------------------------------------  2D measurements        Normal  Doppler measurements   Normal Left ventricle                 Left ventricle LVID ED,   46.5 mm     43-52   Ea, lat     10.6 cm/s  ------ chord,                         ann, tiss PLAX                           DP LVID ES,   28.5 mm     23-38   E/Ea, lat  10.38       ------ chord,                         ann, tiss PLAX                           DP FS, chord,   39 %      >29     Ea, med     8.22 cm/s   ------ PLAX                           ann, tiss LVPW, ED   12.9 mm     ------  DP IVS/LVPW      1        <1.3    E/Ea, med  13.38       ------ ratio, ED                      ann, tiss Ventricular septum             DP IVS, ED    12.9 mm     ------  LVOT LVOT                           Peak vel,    137 cm/s  ------ Diam, S      19 mm     ------  S Area       2.84 cm^2   ------  VTI, S      31.8 cm    ------ Diam         19 mm     ------  Peak           8 mm Hg ------ Aorta  gradient, Root diam,   32 mm     ------  S ED                             Stroke vol  90.2 ml    ------ Left atrium                    Stroke      31.2 ml/m^ ------ AP dim       48 mm     ------  index            2 AP dim     1.66 cm/m^2 <2.2    Mitral valve index                          Peak E vel   110 cm/s  ------                                Peak A vel   117 cm/s  ------                                Decelerati   254 ms    150-23                                on time                0                                Peak           5 mm Hg ------                                gradient,                                D                                Peak E/A     0.9       ------                                ratio                                Right ventricle                                Sa vel,     19.6 cm/s  ------                                lat ann,  tiss DP   Noninvasive Vascular Lab  Bilateral Lower Extremity Venous Duplex Evaluation  Patient:    Kelly [name], Kelly [name] MR #:       [id number] Study Date: 04/09/2014 Gender:     F Age:        70 Height: Weight: BSA: Pt. Status: Room:       [id number]   SONOGRAPHER  Guinevere Ferrari, RVT  ATTENDING    Gailen Shelter [phone]  Reports also to:  ------------------------------------------------------------------- History and indications:  Indications  729.5 Pain  in limb.  782.3 Edema.  415.19 Other pulmonary embolism and infarction. 270.35 Chronic diastolic heart failure. 459.81 Chronic venous insufficiency. 278.01 Morbid obesity. Prior study from 11-18-12 is available for comparison, which is negative for DVT bilaterally.  ------------------------------------------------------------------- Study information:  Portable.  Study status:  Routine.  Procedure:  A vascular evaluation was performed with the patient in the supine position. The right common femoral, right femoral, right greater saphenous, right profunda femoral, right popliteal, right posterior tibial, left common femoral, left femoral, left greater saphenous, left profunda femoral, left popliteal, and left posterior tibial veins were studied. Image quality was suboptimal. The study was technically limited due to body habitus.    Bilateral lower extremity venous duplex evaluation.     Doppler flow study including B-mode compression maneuvers of all visualized segments, color flow Doppler and selected views of pulsed wave Doppler. Birthdate:  Patient birthdate: 03/02/1946.  Age:  Patient is 68 yr old.  Sex:  Gender: female.  Study date:  Study date: 04/09/2014. Study time: 12:47 PM.  Location:  Bedside.  Patient status: Inpatient. Venous flow:  +-----------------+-------+---------------------+-----------------+ Location         OverallFlow properties      Comments          +-----------------+-------+---------------------+-----------------+ Right common     Patent Phasic; spontaneous; ----------------- femoral                 compressible                           +-----------------+-------+---------------------+-----------------+ Right femoral    Patent Compressible         The vein is only                                               visualized in the                                              proximal thigh.    +-----------------+-------+---------------------+-----------------+ Right profunda   Patent Phasic; spontaneous; ----------------- femoral                 compressible                           +-----------------+-------+---------------------+-----------------+ Right popliteal  Patent Phasic; spontaneous; -----------------                         compressible                           +-----------------+-------+---------------------+-----------------+  Right posterior  Patent Phasic; spontaneous; ----------------- tibial                  compressible                           +-----------------+-------+---------------------+-----------------+ Right peroneal   ----------------------------Not visualized.   +-----------------+-------+---------------------+-----------------+ Right            Patent Compressible         ----------------- saphenofemoral                                                 junction                                                       +-----------------+-------+---------------------+-----------------+ Right greater    Patent Compressible         ----------------- saphenous                                                      +-----------------+-------+---------------------+-----------------+ Left common      Patent Phasic; spontaneous; ----------------- femoral                 compressible                           +-----------------+-------+---------------------+-----------------+ Left femoral     Patent Compressible         The vein is only                                               visualized in the                                              proximal thigh.   +-----------------+-------+---------------------+-----------------+ Left profunda    Patent Phasic; spontaneous; ----------------- femoral                 compressible                            +-----------------+-------+---------------------+-----------------+ Left popliteal   Patent Phasic; spontaneous; -----------------                         compressible                           +-----------------+-------+---------------------+-----------------+ Left posterior   Patent Phasic; spontaneous; ----------------- tibial                  compressible                           +-----------------+-------+---------------------+-----------------+  Left peroneal    ----------------------------Not visualized.   +-----------------+-------+---------------------+-----------------+ Left             Patent Compressible         ----------------- saphenofemoral                                                 junction                                                       +-----------------+-------+---------------------+-----------------+ Left greater     Patent Compressible         ----------------- saphenous                                                      +-----------------+-------+---------------------+-----------------+  ------------------------------------------------------------------- Summary:  - No obvious evidence of deep vein or superficial thrombosis   involving the right lower extremity and left lower extremity. - No evidence of Baker&'s cyst on the right or left.  CHEST  2 VIEW   COMPARISON:  11/17/2012   FINDINGS: Enlargement of cardiac silhouette with pulmonary vascular congestion.   Accentuation of perihilar markings unchanged.   Minimal LEFT basilar subsegmental atelectasis.   No definite acute infiltrate, pleural effusion or pneumothorax.   Diffuse idiopathic skeletal hyperostosis.   IMPRESSION: Enlargement of cardiac silhouette with pulmonary vascular congestion.   Minimal LEFT basilar atelectasis.     LEFT ANKLE COMPLETE - 3+ VIEW   COMPARISON:  No priors.   FINDINGS: Multiple views of the left  ankle demonstrate no acute displaced fracture, subluxation, dislocation, or soft tissue abnormality. Enthesophytes are noted off the dorsal and plantar aspect of the calcaneus. Numerous vascular calcifications are noted. Diffuse soft tissue edema of the visualized lower extremity.   IMPRESSION: 1. No acute radiographic abnormality of the left ankle. 2. Plantar and dorsal calcaneal spurs. 3. Atherosclerosis.    ASSESSMENT/PLAN:  Diabetes mellitus with renal complications-well-controlled Renovascular hypertension-well-controlled Chronic kidney disease stage III-check renal functions  Neuropathy-continue Neurontin Anemia of chronic kidney disease-check hemoglobin. Continue iron. Left lower extremity wound-status for this recurrent incision and drainage. Continue wound VAC. CHF-compensated Constipation-well-controlled CBC and BMP  I have reviewed patient's medical records received at admission/from hospitalization.  CPT CODE: 18841  Taurean Ju Y Tommie Dejoseph, [place] [phone]

## 2014-04-13 ENCOUNTER — Encounter (HOSPITAL_COMMUNITY): Payer: Self-pay | Admitting: *Deleted

## 2014-04-13 ENCOUNTER — Encounter (HOSPITAL_COMMUNITY): Payer: Self-pay | Admitting: Pharmacy Technician

## 2014-04-13 MED ORDER — CIPROFLOXACIN IN D5W 400 MG/200ML IV SOLN
400.0000 mg | INTRAVENOUS | Status: AC
  Administered 2014-04-14: 400 mg via INTRAVENOUS
  Filled 2014-04-13: qty 200

## 2014-04-14 ENCOUNTER — Encounter (HOSPITAL_COMMUNITY): Payer: Self-pay | Admitting: Plastic Surgery

## 2014-04-14 ENCOUNTER — Encounter (HOSPITAL_COMMUNITY)
Admission: RE | Disposition: A | Discharge status: Skilled Nursing Facility | Payer: Self-pay | Source: Ambulatory Visit | Attending: Plastic Surgery

## 2014-04-14 ENCOUNTER — Ambulatory Visit (HOSPITAL_COMMUNITY)
Admission: RE | Admit: 2014-04-14 | Discharge: 2014-04-14 | Disposition: A | Discharge status: Skilled Nursing Facility | Payer: Medicare Other | Source: Ambulatory Visit | Attending: Plastic Surgery | Admitting: Plastic Surgery

## 2014-04-14 ENCOUNTER — Ambulatory Visit (HOSPITAL_COMMUNITY): Payer: Medicare Other | Admitting: Certified Registered"

## 2014-04-14 ENCOUNTER — Non-Acute Institutional Stay (SKILLED_NURSING_FACILITY): Payer: Medicare Other | Admitting: Internal Medicine

## 2014-04-14 ENCOUNTER — Ambulatory Visit (HOSPITAL_COMMUNITY): Payer: Medicare Other

## 2014-04-14 ENCOUNTER — Encounter (HOSPITAL_COMMUNITY): Payer: Medicare Other | Admitting: Certified Registered"

## 2014-04-14 DIAGNOSIS — F3289 Other specified depressive episodes: Secondary | ICD-10-CM | POA: Insufficient documentation

## 2014-04-14 DIAGNOSIS — E785 Hyperlipidemia, unspecified: Secondary | ICD-10-CM | POA: Insufficient documentation

## 2014-04-14 DIAGNOSIS — Z800 Family history of malignant neoplasm of digestive organs: Secondary | ICD-10-CM | POA: Insufficient documentation

## 2014-04-14 DIAGNOSIS — E119 Type 2 diabetes mellitus without complications: Secondary | ICD-10-CM | POA: Diagnosis not present

## 2014-04-14 DIAGNOSIS — Z79899 Other long term (current) drug therapy: Secondary | ICD-10-CM | POA: Insufficient documentation

## 2014-04-14 DIAGNOSIS — F329 Major depressive disorder, single episode, unspecified: Secondary | ICD-10-CM | POA: Insufficient documentation

## 2014-04-14 DIAGNOSIS — N183 Chronic kidney disease, stage 3 unspecified: Secondary | ICD-10-CM

## 2014-04-14 DIAGNOSIS — S81802S Unspecified open wound, left lower leg, sequela: Secondary | ICD-10-CM

## 2014-04-14 DIAGNOSIS — N039 Chronic nephritic syndrome with unspecified morphologic changes: Secondary | ICD-10-CM

## 2014-04-14 DIAGNOSIS — I509 Heart failure, unspecified: Secondary | ICD-10-CM | POA: Diagnosis not present

## 2014-04-14 DIAGNOSIS — L97109 Non-pressure chronic ulcer of unspecified thigh with unspecified severity: Secondary | ICD-10-CM | POA: Insufficient documentation

## 2014-04-14 DIAGNOSIS — Z7982 Long term (current) use of aspirin: Secondary | ICD-10-CM | POA: Insufficient documentation

## 2014-04-14 DIAGNOSIS — E11621 Type 2 diabetes mellitus with foot ulcer: Secondary | ICD-10-CM | POA: Diagnosis present

## 2014-04-14 DIAGNOSIS — M1710 Unilateral primary osteoarthritis, unspecified knee: Secondary | ICD-10-CM | POA: Insufficient documentation

## 2014-04-14 DIAGNOSIS — I10 Essential (primary) hypertension: Secondary | ICD-10-CM | POA: Diagnosis not present

## 2014-04-14 DIAGNOSIS — D631 Anemia in chronic kidney disease: Secondary | ICD-10-CM

## 2014-04-14 DIAGNOSIS — IMO0002 Reserved for concepts with insufficient information to code with codable children: Secondary | ICD-10-CM

## 2014-04-14 DIAGNOSIS — G4730 Sleep apnea, unspecified: Secondary | ICD-10-CM | POA: Diagnosis not present

## 2014-04-14 DIAGNOSIS — L97529 Non-pressure chronic ulcer of other part of left foot with unspecified severity: Secondary | ICD-10-CM

## 2014-04-14 DIAGNOSIS — G43909 Migraine, unspecified, not intractable, without status migrainosus: Secondary | ICD-10-CM | POA: Insufficient documentation

## 2014-04-14 DIAGNOSIS — K219 Gastro-esophageal reflux disease without esophagitis: Secondary | ICD-10-CM | POA: Diagnosis not present

## 2014-04-14 DIAGNOSIS — S91002S Unspecified open wound, left ankle, sequela: Secondary | ICD-10-CM

## 2014-04-14 DIAGNOSIS — R609 Edema, unspecified: Secondary | ICD-10-CM

## 2014-04-14 DIAGNOSIS — Z881 Allergy status to other antibiotic agents status: Secondary | ICD-10-CM | POA: Diagnosis not present

## 2014-04-14 DIAGNOSIS — Z880 Allergy status to penicillin: Secondary | ICD-10-CM | POA: Insufficient documentation

## 2014-04-14 DIAGNOSIS — S81002S Unspecified open wound, left knee, sequela: Secondary | ICD-10-CM

## 2014-04-14 HISTORY — PX: MINOR APPLICATION OF WOUND VAC: SHX6243

## 2014-04-14 HISTORY — PX: INCISION AND DRAINAGE OF WOUND: SHX1803

## 2014-04-14 HISTORY — PX: APPLICATION OF A-CELL OF EXTREMITY: SHX6303

## 2014-04-14 LAB — GLUCOSE, CAPILLARY
GLUCOSE-CAPILLARY: 106 mg/dL — AB (ref 70–99)
Glucose-Capillary: 120 mg/dL — ABNORMAL HIGH (ref 70–99)

## 2014-04-14 SURGERY — IRRIGATION AND DEBRIDEMENT WOUND
Anesthesia: General | Site: Leg Upper | Laterality: Left

## 2014-04-14 MED ORDER — OXYCODONE HCL 5 MG PO TABS: 5.0000 mg | ORAL_TABLET | Freq: Once | ORAL | Status: DC | PRN

## 2014-04-14 MED ORDER — MIDAZOLAM HCL 5 MG/5ML IJ SOLN
INTRAMUSCULAR | Status: DC | PRN
  Administered 2014-04-14: 2 mg via INTRAVENOUS

## 2014-04-14 MED ORDER — PROPOFOL 10 MG/ML IV BOLUS
INTRAVENOUS | Status: AC
  Filled 2014-04-14: qty 20

## 2014-04-14 MED ORDER — LIDOCAINE HCL (CARDIAC) 20 MG/ML IV SOLN
INTRAVENOUS | Status: DC | PRN
  Administered 2014-04-14: 100 mg via INTRAVENOUS

## 2014-04-14 MED ORDER — PROMETHAZINE HCL 25 MG/ML IJ SOLN: 6.2500 mg | INTRAMUSCULAR | Status: DC | PRN

## 2014-04-14 MED ORDER — PROPOFOL 10 MG/ML IV BOLUS
INTRAVENOUS | Status: DC | PRN
  Administered 2014-04-14: 150 mg via INTRAVENOUS
  Administered 2014-04-14: 50 mg via INTRAVENOUS

## 2014-04-14 MED ORDER — DOUBLE ANTIBIOTIC 500-10000 UNIT/GM EX OINT
TOPICAL_OINTMENT | CUTANEOUS | Status: AC
  Filled 2014-04-14: qty 1

## 2014-04-14 MED ORDER — SODIUM CHLORIDE 0.9 % IR SOLN
Status: DC | PRN
  Administered 2014-04-14: 1000 mL

## 2014-04-14 MED ORDER — OXYCODONE HCL 5 MG/5ML PO SOLN: 5.0000 mg | Freq: Once | ORAL | Status: DC | PRN

## 2014-04-14 MED ORDER — CIPROFLOXACIN HCL 500 MG PO TABS: 500.0000 mg | ORAL_TABLET | Freq: Two times a day (BID) | ORAL | Status: DC

## 2014-04-14 MED ORDER — SODIUM CHLORIDE 0.9 % IR SOLN
Status: DC | PRN
  Administered 2014-04-14: 14:00:00

## 2014-04-14 MED ORDER — BACIT-POLY-NEO HC 1 % EX OINT
TOPICAL_OINTMENT | CUTANEOUS | Status: AC
  Filled 2014-04-14: qty 15

## 2014-04-14 MED ORDER — MIDAZOLAM HCL 2 MG/2ML IJ SOLN
INTRAMUSCULAR | Status: AC
  Filled 2014-04-14: qty 2

## 2014-04-14 MED ORDER — EPHEDRINE SULFATE 50 MG/ML IJ SOLN
INTRAMUSCULAR | Status: DC | PRN
  Administered 2014-04-14: 10 mg via INTRAVENOUS

## 2014-04-14 MED ORDER — ONDANSETRON HCL 4 MG/2ML IJ SOLN
INTRAMUSCULAR | Status: DC | PRN
  Administered 2014-04-14: 4 mg via INTRAVENOUS

## 2014-04-14 MED ORDER — HYDROMORPHONE HCL PF 1 MG/ML IJ SOLN: 0.2500 mg | INTRAMUSCULAR | Status: DC | PRN

## 2014-04-14 MED ORDER — SODIUM CHLORIDE 0.9 % IV SOLN
INTRAVENOUS | Status: DC
  Administered 2014-04-14: 11:00:00 via INTRAVENOUS

## 2014-04-14 MED ORDER — ARTIFICIAL TEARS OP OINT
TOPICAL_OINTMENT | OPHTHALMIC | Status: AC
  Filled 2014-04-14: qty 3.5

## 2014-04-14 MED ORDER — FENTANYL CITRATE 0.05 MG/ML IJ SOLN
INTRAMUSCULAR | Status: DC | PRN
  Administered 2014-04-14 (×2): 50 ug via INTRAVENOUS

## 2014-04-14 MED ORDER — FENTANYL CITRATE 0.05 MG/ML IJ SOLN
INTRAMUSCULAR | Status: AC
  Filled 2014-04-14: qty 5

## 2014-04-14 MED ORDER — ROCURONIUM BROMIDE 50 MG/5ML IV SOLN
INTRAVENOUS | Status: AC
  Filled 2014-04-14: qty 1

## 2014-04-14 MED ORDER — ONDANSETRON HCL 4 MG/2ML IJ SOLN
INTRAMUSCULAR | Status: AC
  Filled 2014-04-14: qty 2

## 2014-04-14 SURGICAL SUPPLY — 41 items
BAG DECANTER FOR FLEXI CONT (MISCELLANEOUS) ×2 IMPLANT
BANDAGE ELASTIC 4 VELCRO ST LF (GAUZE/BANDAGES/DRESSINGS) ×2 IMPLANT
BLADE SURG ROTATE 9660 (MISCELLANEOUS) IMPLANT
BNDG GAUZE ELAST 4 BULKY (GAUZE/BANDAGES/DRESSINGS) ×2 IMPLANT
CANISTER SUCTION 2500CC (MISCELLANEOUS) ×3 IMPLANT
CHLORAPREP W/TINT 26ML (MISCELLANEOUS) IMPLANT
CONT SPEC STER OR (MISCELLANEOUS) IMPLANT
COVER SURGICAL LIGHT HANDLE (MISCELLANEOUS) ×3 IMPLANT
DRAPE INCISE IOBAN 66X45 STRL (DRAPES) ×2 IMPLANT
DRAPE LAPAROSCOPIC ABDOMINAL (DRAPES) ×2 IMPLANT
DRAPE PED LAPAROTOMY (DRAPES) ×3 IMPLANT
DRAPE PROXIMA HALF (DRAPES) ×2 IMPLANT
DRSG ADAPTIC 3X8 NADH LF (GAUZE/BANDAGES/DRESSINGS) ×2 IMPLANT
DRSG PAD ABDOMINAL 8X10 ST (GAUZE/BANDAGES/DRESSINGS) ×1 IMPLANT
DRSG VAC ATS LRG SENSATRAC (GAUZE/BANDAGES/DRESSINGS) IMPLANT
DRSG VAC ATS MED SENSATRAC (GAUZE/BANDAGES/DRESSINGS) ×2 IMPLANT
DRSG VAC ATS SM SENSATRAC (GAUZE/BANDAGES/DRESSINGS) IMPLANT
ELECT CAUTERY BLADE 6.4 (BLADE) ×3 IMPLANT
ELECT REM PT RETURN 9FT ADLT (ELECTROSURGICAL) ×3
ELECTRODE REM PT RTRN 9FT ADLT (ELECTROSURGICAL) ×1 IMPLANT
GAUZE SPONGE 4X4 12PLY STRL (GAUZE/BANDAGES/DRESSINGS) ×3 IMPLANT
GLOVE BIO SURGEON STRL SZ 6.5 (GLOVE) ×2 IMPLANT
GLOVE BIO SURGEONS STRL SZ 6.5 (GLOVE) ×1
GOWN STRL REUS W/ TWL LRG LVL3 (GOWN DISPOSABLE) ×2 IMPLANT
GOWN STRL REUS W/TWL LRG LVL3 (GOWN DISPOSABLE) ×9
KIT BASIN OR (CUSTOM PROCEDURE TRAY) ×3 IMPLANT
KIT ROOM TURNOVER OR (KITS) ×3 IMPLANT
MARKER SKIN DUAL TIP RULER LAB (MISCELLANEOUS) ×2 IMPLANT
MATRIX SURGICAL PSM 7X10CM (Tissue) ×2 IMPLANT
MICROMATRIX 1000MG (Tissue) ×3 IMPLANT
NS IRRIG 1000ML POUR BTL (IV SOLUTION) ×3 IMPLANT
PACK GENERAL/GYN (CUSTOM PROCEDURE TRAY) ×3 IMPLANT
PAD ARMBOARD 7.5X6 YLW CONV (MISCELLANEOUS) ×4 IMPLANT
SOLUTION PARTIC MCRMTRX 1000MG (Tissue) IMPLANT
SURGILUBE 2OZ TUBE FLIPTOP (MISCELLANEOUS) IMPLANT
SUT VIC AB 5-0 PS2 18 (SUTURE) ×2 IMPLANT
SWAB COLLECTION DEVICE MRSA (MISCELLANEOUS) IMPLANT
TOWEL OR 17X24 6PK STRL BLUE (TOWEL DISPOSABLE) ×3 IMPLANT
TOWEL OR 17X26 10 PK STRL BLUE (TOWEL DISPOSABLE) ×3 IMPLANT
TUBE ANAEROBIC SPECIMEN COL (MISCELLANEOUS) IMPLANT
UNDERPAD 30X30 INCONTINENT (UNDERPADS AND DIAPERS) ×3 IMPLANT

## 2014-04-14 NOTE — Interval H&P Note (Signed)
History and Physical Interval Note:  04/14/2014 8:03 AM  Kelly [name]  has presented today for surgery, with the diagnosis of open wound left leg  The various methods of treatment have been discussed with the patient and family. After consideration of risks, benefits and other options for treatment, the patient has consented to  Procedure(s): IRRIGATION AND DEBRIDEMENT WOUND, placement of A-Cell and wound vac (Left) APPLICATION OF A-CELL OF EXTREMITY (Left) MINOR APPLICATION OF WOUND VAC (Left) as a surgical intervention .  The patient's history has been reviewed, patient examined, no change in status, stable for surgery.  I have reviewed the patient's chart and labs.  Questions were answered to the patient's satisfaction.     SANGER,Odus Clasby

## 2014-04-14 NOTE — Transfer of Care (Signed)
Immediate Anesthesia Transfer of Care Note  Patient: Kelly [name]  Procedure(s) Performed: Procedure(s): IRRIGATION AND DEBRIDEMENT WOUND (Left) APPLICATION OF A-CELL OF EXTREMITY (Left) MINOR APPLICATION OF WOUND VAC (Left)  Patient Location: PACU  Anesthesia Type:General  Level of Consciousness: awake, alert  and oriented  Airway & Oxygen Therapy: Patient Spontanous Breathing and Patient connected to nasal cannula oxygen  Post-op Assessment: Report given to PACU RN and Post -op Vital signs reviewed and stable  Post vital signs: Reviewed and stable  Complications: No apparent anesthesia complications

## 2014-04-14 NOTE — Brief Op Note (Signed)
04/14/2014  2:01 PM  PATIENT:  Kelly [name]  68 y.o. female  PRE-OPERATIVE DIAGNOSIS:  open wound left leg  POST-OPERATIVE DIAGNOSIS:  open wound left leg  PROCEDURE:  Procedure(s): IRRIGATION AND DEBRIDEMENT WOUND, placement of A-Cell and wound vac (Left) APPLICATION OF A-CELL OF EXTREMITY (Left) MINOR APPLICATION OF WOUND VAC (Left)  SURGEON:  Surgeon(s) and Role:    * Claire Sanger, DO - Primary  PHYSICIAN ASSISTANT:   ASSISTANTS: none   ANESTHESIA:   general  EBL:  Total I/O In: 400 [I.V.:400] Out: -   BLOOD ADMINISTERED:none  DRAINS: none   LOCAL MEDICATIONS USED:  NONE  SPECIMEN:  No Specimen  DISPOSITION OF SPECIMEN:  N/A  COUNTS:  YES  TOURNIQUET:  * No tourniquets in log *  DICTATION: .Dragon Dictation  PLAN OF CARE: Discharge to home after PACU  PATIENT DISPOSITION:  PACU - hemodynamically stable.   Delay start of Pharmacological VTE agent (>24hrs) due to surgical blood loss or risk of bleeding: no

## 2014-04-14 NOTE — Anesthesia Postprocedure Evaluation (Signed)
Anesthesia Post Note  Patient: Kelly [name]  Procedure(s) Performed: Procedure(s) (LRB): IRRIGATION AND DEBRIDEMENT WOUND (Left) APPLICATION OF A-CELL OF EXTREMITY (Left) MINOR APPLICATION OF WOUND VAC (Left)  Anesthesia type:GA  Patient location: PACU  Post pain: Pain level controlled  Post assessment: Patient's Cardiovascular Status Stable  Last Vitals:  Filed Vitals:   04/14/14 1430  BP: 158/62  Pulse: 69  Temp:   Resp: 12    Post vital signs: Reviewed and stable  Level of consciousness: sedated  Complications: No apparent anesthesia complications

## 2014-04-14 NOTE — Op Note (Signed)
Operative Note   DATE OF OPERATION: 04/14/2014  LOCATION: [facility] OR outpatient  SURGICAL DIVISION: Plastic Surgery  PREOPERATIVE DIAGNOSES:  Large left leg wound  POSTOPERATIVE DIAGNOSES:  same  PROCEDURE:  Preparation of wound bed for placement of Acell (1 gm powder and sheet 7 x 10 cm) and VAC placement (15 x 3 x 1.5 cm)  SURGEON: Daianna Vasques Sanger, DO  ANESTHESIA:  General.   COMPLICATIONS: None.   INDICATIONS FOR PROCEDURE:  The patient, Kelly [name] is a 68 y.o. female born on 19-Mar-1946, is here for treatment of left leg wound. MRN: [id number]  CONSENT:  Informed consent was obtained directly from the patient. Risks, benefits and alternatives were fully discussed. Specific risks including but not limited to bleeding, infection, hematoma, seroma, scarring, pain, infection, contracture, asymmetry, wound healing problems, and need for further surgery were all discussed. The patient did have an ample opportunity to have questions answered to satisfaction.   DESCRIPTION OF PROCEDURE:  The patient was taken to the operating room. The patient's operative site was prepped and draped in a sterile fashion. A time out was performed and all information was confirmed to be correct.  Sedation was administered.  The area was irrigated.   Hemostasis was achieved with pressure.  The Acell powder was placed followed by the Acell sheet.  The adaptic was applied and the VAC.  There was an excellent seal.  The patient tolerated the procedure well.  There were no complications. The patient was allowed to wake and taken to the recovery room in satisfactory condition.

## 2014-04-14 NOTE — Anesthesia Preprocedure Evaluation (Addendum)
Anesthesia Evaluation  Patient identified by MRN, date of birth, ID band Patient awake    Reviewed: Allergy & Precautions, H&P , NPO status , Patient's Chart, lab work & pertinent test results  History of Anesthesia Complications Negative for: history of anesthetic complications  Airway Mallampati: II TM Distance: >3 FB Neck ROM: Full    Dental  (+) Partial Upper, Dental Advisory Given   Pulmonary sleep apnea , former smoker,    Pulmonary exam normal       Cardiovascular hypertension, Pt. on medications +CHF     Neuro/Psych  Headaches, Depression    GI/Hepatic GERD-  Medicated and Controlled,  Endo/Other  diabetes, Poorly Controlled, Type 2, Insulin Dependent  Renal/GU Renal InsufficiencyRenal disease     Musculoskeletal   Abdominal   Peds  Hematology   Anesthesia Other Findings   Reproductive/Obstetrics                          Anesthesia Physical Anesthesia Plan  ASA: III  Anesthesia Plan: General   Post-op Pain Management:    Induction: Intravenous  Airway Management Planned: LMA  Additional Equipment:   Intra-op Plan:   Post-operative Plan:   Informed Consent: I have reviewed the patients History and Physical, chart, labs and discussed the procedure including the risks, benefits and alternatives for the proposed anesthesia with the patient or authorized representative who has indicated his/her understanding and acceptance.   Dental advisory given  Plan Discussed with: Anesthesiologist and Surgeon  Anesthesia Plan Comments:         Anesthesia Quick Evaluation

## 2014-04-14 NOTE — Progress Notes (Signed)
Ate [place] sandwich/pretzels/juice well w/o nausea

## 2014-04-14 NOTE — Progress Notes (Signed)
Report given to [facility] at [facility] facility

## 2014-04-14 NOTE — H&P (View-Only) (Signed)
Kelly [name] is an 67 y.o. female.   Chief complaint: left thigh ulcer HPI: The patient is a 67 yrs old bf here for treatment of her left thigh ulcer.  She underwent a panniculectomy type procedure and was sent to a nursing facility.  She developed a DVT and anticoagulaton was started.  She bleed into the wound and had to be taken to the OR for I and D.  The area was debrided and Acell with the VAC was placed.  She has been taken to the OR for a VAC change and Acell placement several times and presents today for another VAC change with placement of Acell. She has a greenfield filter in place.  Past Medical History  Diagnosis Date  . Hypertension   . Hyperlipidemia   . CHF (congestive heart failure)   . GERD (gastroesophageal reflux disease)   . Depression   . Sleep apnea     doesn't use cpap machine or 02 at night  . Headache(784.0)     Migraines  . Arthritis     knees  . Anemia     Had blood transfusion x 2 in yhe 90s  . Lymphedema of leg   . Pulmonary embolism 02/18/14    diagnosed at [facility] with CT angio chest  . Chronic indwelling Foley catheter   . Diabetes mellitus     not on medications    Past Surgical History  Procedure Laterality Date  . Rotator cuff repair      right  . Knee arthroscopy      right  . Temple biopsy    . Colonoscopy with propofol  09/23/2012    Procedure: COLONOSCOPY WITH PROPOFOL;  Surgeon: Kelly M Pyrtle, MD;  Location: [facility];  Service: Gastroenterology;  Laterality: N/A;  . Paniculectomy with wound debridement Left 02/08/2014    left medial thigh  . Irrigation and debridement abscess Left 02/24/2014    Procedure: MINOR INCISION AND DRAINAGE OF ABSCESS;  Surgeon: Kelly Garron Sanger, DO;  Location: [facility] ORS;  Service: Plastics;  Laterality: Left;  . I&d extremity Left 03/03/2014    Procedure: IRRIGATION AND DEBRIDEMENT LEFT LEG WOUND AND PLACEMENT OF A CELL AND VAC;  Surgeon: Kelly Nanez Sanger, DO;  Location: [facility] OR;  Service: Plastics;  Laterality: Left;  .  Incision and drainage of wound Left 03/16/2014    Procedure: IRRIGATION AND DEBRIDEMENT OF LEFT THIGH WOUND, APPLICATION ACELL;  Surgeon: Kelly Thimmappa, MD;  Location: [facility] OR;  Service: Plastics;  Laterality: Left;  . Incision and drainage of wound Left 03/25/2014    Procedure: IRRIGATION AND DEBRIDEMENT OF LEFT LEG WOUND WITH PLACEMENT OF ACELL/VAC;  Surgeon: Kelly Sees Sanger, DO;  Location: [facility] OR;  Service: Plastics;  Laterality: Left;    Family History  Problem Relation Age of Onset  . Emphysema Father   . Asthma Brother   . Heart disease Mother   . Stomach cancer Brother   . Heart disease Maternal Grandmother   . Diabetes Mother   . Diabetes Sister    Social History:  reports that she quit smoking about 32 years ago. Her smoking use included Cigarettes. She smoked 0.00 packs per day. She has never used smokeless tobacco. She reports that she does not drink alcohol or use illicit drugs.  Allergies:  Allergies  Allergen Reactions  . Bactrim [Sulfamethoxazole-Tmp Ds] Other (See Comments)    Hyperkalemia (July 2015)  . Penicillins Hives    Medications Prior to Admission  Medication Sig Dispense Refill  .   allopurinol (ZYLOPRIM) 100 MG tablet Take 100 mg by mouth daily.       . aspirin EC 325 MG tablet Take 325 mg by mouth daily.      . atorvastatin (LIPITOR) 40 MG tablet Take 40 mg by mouth daily.       . chlorthalidone (HYGROTON) 25 MG tablet Take 12.5 mg by mouth daily.      . doxycycline (VIBRAMYCIN) 100 MG capsule Take 100 mg by mouth 2 (two) times daily. For 7 days      . esomeprazole (NEXIUM) 40 MG capsule Take 40 mg by mouth daily before breakfast. Take on an empty stomach      . gabapentin (NEURONTIN) 300 MG capsule Take 300 mg by mouth 3 (three) times daily.      . isosorbide mononitrate (ISMO,MONOKET) 10 MG tablet Take 10 mg by mouth 2 (two) times daily.      . magnesium oxide (MAG-OX) 400 MG tablet Take 400 mg by mouth daily.       . metoprolol tartrate (LOPRESSOR) 25 MG  tablet Take 37.5 mg by mouth 2 (two) times daily.       . Oxycodone HCl 10 MG TABS Take 10 mg by mouth every 4 (four) hours as needed (for pain).      . polyethylene glycol (MIRALAX / GLYCOLAX) packet Take 17 g by mouth daily. Mix with 4-6 oz liquid      . promethazine (PHENERGAN) 25 MG tablet Take 25 mg by mouth 2 (two) times daily as needed for nausea or vomiting.      . zolpidem (AMBIEN CR) 6.25 MG CR tablet Take 6.25 mg by mouth at bedtime as needed for sleep.        Results for orders placed during the hospital encounter of 03/31/14 (from the past 48 hour(s))  GLUCOSE, CAPILLARY     Status: Abnormal   Collection Time    03/31/14 12:51 PM      Result Value Ref Range   Glucose-Capillary 132 (*) 70 - 99 mg/dL  BASIC METABOLIC PANEL     Status: Abnormal   Collection Time    03/31/14  1:01 PM      Result Value Ref Range   Sodium 139  137 - 147 mEq/L   Potassium 4.7  3.7 - 5.3 mEq/L   Chloride 106  96 - 112 mEq/L   CO2 21  19 - 32 mEq/L   Glucose, Bld 124 (*) 70 - 99 mg/dL   BUN 21  6 - 23 mg/dL   Creatinine, Ser 1.18 (*) 0.50 - 1.10 mg/dL   Calcium 8.4  8.4 - 10.5 mg/dL   GFR calc non Af Amer 47 (*) >90 mL/min   GFR calc Af Amer 54 (*) >90 mL/min   Comment: (NOTE)     The eGFR has been calculated using the CKD EPI equation.     This calculation has not been validated in all clinical situations.     eGFR's persistently <90 mL/min signify possible Chronic Kidney     Disease.   Anion gap 12  5 - 15  CBC     Status: Abnormal   Collection Time    03/31/14  1:01 PM      Result Value Ref Range   WBC 7.1  4.0 - 10.5 K/uL   RBC 3.21 (*) 3.87 - 5.11 MIL/uL   Hemoglobin 9.4 (*) 12.0 - 15.0 g/dL   HCT 29.8 (*) 36.0 - 46.0 %   MCV   92.8  78.0 - 100.0 fL   MCH 29.3  26.0 - 34.0 pg   MCHC 31.5  30.0 - 36.0 g/dL   RDW 16.0 (*) 11.5 - 15.5 %   Platelets 208  150 - 400 K/uL   No results found.  ROS  Blood pressure 146/46, pulse 61, temperature 97.9 F (36.6 C), temperature source  Oral, resp. rate 18, height 5' 4" (1.626 m), weight 146.512 kg (323 lb), SpO2 100.00%. Physical Exam   Assessment/Plan Irrigation and debridement of left hip ulcer with Acell and VAC placement.  [name],Kelly [name] 03/31/2014, 3:22 PM    

## 2014-04-14 NOTE — Anesthesia Procedure Notes (Signed)
Procedure Name: LMA Insertion Date/Time: 04/14/2014 1:29 PM Performed by: Maryland Pink Pre-anesthesia Checklist: Patient identified, Emergency Drugs available, Suction available, Patient being monitored and Timeout performed Patient Re-evaluated:Patient Re-evaluated prior to inductionOxygen Delivery Method: Circle system utilized Preoxygenation: Pre-oxygenation with 100% oxygen Intubation Type: IV induction LMA: LMA inserted LMA Size: 4.0 Number of attempts: 1 Placement Confirmation: positive ETCO2 and breath sounds checked- equal and bilateral Tube secured with: Tape Dental Injury: Teeth and Oropharynx as per pre-operative assessment

## 2014-04-14 NOTE — Discharge Instructions (Signed)
Apply triple antibiotic ointment to left great toe daily. Cipro 500 mg bid for 10 days for toe infection.

## 2014-04-15 ENCOUNTER — Encounter (HOSPITAL_COMMUNITY): Payer: Self-pay | Admitting: Plastic Surgery

## 2014-04-19 ENCOUNTER — Other Ambulatory Visit: Payer: Self-pay | Admitting: Plastic Surgery

## 2014-04-19 ENCOUNTER — Non-Acute Institutional Stay (SKILLED_NURSING_FACILITY): Payer: Medicare Other | Admitting: Internal Medicine

## 2014-04-19 DIAGNOSIS — N183 Chronic kidney disease, stage 3 unspecified: Secondary | ICD-10-CM

## 2014-04-19 DIAGNOSIS — L97123 Non-pressure chronic ulcer of left thigh with necrosis of muscle: Secondary | ICD-10-CM

## 2014-04-20 ENCOUNTER — Encounter (HOSPITAL_COMMUNITY): Payer: Self-pay | Admitting: *Deleted

## 2014-04-20 DIAGNOSIS — Z79899 Other long term (current) drug therapy: Secondary | ICD-10-CM | POA: Insufficient documentation

## 2014-04-20 MED ORDER — CIPROFLOXACIN IN D5W 400 MG/200ML IV SOLN
400.0000 mg | INTRAVENOUS | Status: AC
  Administered 2014-04-21: 400 mg via INTRAVENOUS
  Filled 2014-04-20: qty 200

## 2014-04-20 NOTE — Progress Notes (Signed)
        PROGRESS NOTE  DATE: 04/14/2014          FACILITY:  [facility]  LEVEL OF CARE: SNF (31)  Acute Visit  CHIEF COMPLAINT:  Manage increasing lower extremity swelling and anemia of chronic kidney disease.    HISTORY OF PRESENT ILLNESS: I was requested by the staff to assess the patient regarding above problem(s):  EDEMA: The patient's edema is unstable. Patient is complaining of increased lower extremity swelling, especially in the left lower extremity, with skin breakdown in her left great toe and drainage. The patient denies calf pain, chest pain or shortness of breath. No complications reported from the medications currently being used.  She has chronic lymphedema.  She is currently not on any diuretics.     ANEMIA: The anemia has been stable. The patient denies fatigue, melena or hematochezia. No complications from the medications currently being used.  On 04/13/2014:  Hemoglobin 8.1, MCV 91.  On 04/08/2014:  Hemoglobin 8.2.  The patient's edema is secondary to chronic kidney disease.    PAST MEDICAL HISTORY : Reviewed.  No changes/see problem list  CURRENT MEDICATIONS: Reviewed per MAR/see medication list  REVIEW OF SYSTEMS:  GENERAL: no change in appetite, no fatigue, no weight changes, no fever, chills or weakness RESPIRATORY: no cough, SOB, DOE,, wheezing, hemoptysis CARDIAC: no chest pain or palpitations;  chronic lower extremity swelling, complains of increasing left lower extremity swelling        GI: no abdominal pain, diarrhea, constipation, heart burn, nausea or vomiting  PHYSICAL EXAMINATION  VS: see VS section  GENERAL: no acute distress, morbidly obese body habitus NECK: supple, trachea midline, no neck masses, no thyroid tenderness, no thyromegaly RESPIRATORY: breathing is even & unlabored, BS CTAB CARDIAC: RRR, no murmur,no extra heart sounds, +3 left lower extremity edema, +2 right lower extremity edema        GI: abdomen soft, normal  BS, no masses, no tenderness, no hepatomegaly, no splenomegaly PSYCHIATRIC: the patient is alert & oriented to person, affect & behavior appropriate  LABS/RADIOLOGY:  04/13/2014:  BMP normal.    ASSESSMENT/PLAN:  Increasing left lower extremity edema.  New problem.  Start Lasix 20 mg q.d.  Also, start KCl 10 mEq q.d.    Anemia of chronic kidney disease.  Stable.    V58.69.  Check BMP in two days.    CPT CODE: 82500            Kelly [name] Kelly [name], [place] [phone]

## 2014-04-20 NOTE — Progress Notes (Signed)
         PROGRESS NOTE  DATE: 04/19/2014         FACILITY:  [facility]  LEVEL OF CARE: SNF (31)  Acute Visit  CHIEF COMPLAINT:  Manage chronic kidney disease stage III.    HISTORY OF PRESENT ILLNESS: I was requested by the staff to assess the patient regarding above problem(s):  CHRONIC KIDNEY DISEASE: The patient's chronic kidney disease is unstable.  Patient denies increasing lower extremity swelling or confusion. Last BUN and creatinine are:   On 04/16/2014:  BUN 19, creatinine 1.16.  In 02/2014:  Creatinine 0.98.  She has a history of chronic kidney disease stage III.  Patient was started on Lasix on 04/14/2014.    PAST MEDICAL HISTORY : Reviewed.  No changes/see problem list  CURRENT MEDICATIONS: Reviewed per MAR/see medication list  REVIEW OF SYSTEMS:  GENERAL: no change in appetite, no fatigue, no weight changes, no fever, chills or weakness RESPIRATORY: no cough, SOB, DOE,, wheezing, hemoptysis CARDIAC: no chest pain or palpitations;  chronic lower extremity swelling       GI: no abdominal pain, diarrhea, constipation, heart burn, nausea or vomiting  PHYSICAL EXAMINATION  VS: see VS section  GENERAL: no acute distress, morbidly obese body habitus NECK: supple, trachea midline, no neck masses, no thyroid tenderness, no thyromegaly RESPIRATORY: breathing is even & unlabored, BS CTAB CARDIAC: RRR, no murmur,no extra heart sounds, left lower extremity has +3 edema, right lower extremity has +2 edema        GI: abdomen soft, normal BS, no masses, no tenderness, no hepatomegaly, no splenomegaly PSYCHIATRIC: the patient is alert & oriented to person, affect & behavior appropriate  ASSESSMENT/PLAN:  Chronic kidney disease stage III.  Unstable problem.  Renal functions are worsening, likely secondary to Lasix.    We will recheck.    CPT CODE: 30865           Advait Buice Y Narciso [place], [place] [phone]

## 2014-04-21 ENCOUNTER — Encounter (HOSPITAL_COMMUNITY): Payer: Self-pay | Admitting: Plastic Surgery

## 2014-04-21 ENCOUNTER — Ambulatory Visit (HOSPITAL_COMMUNITY)
Admission: RE | Admit: 2014-04-21 | Discharge: 2014-04-21 | Disposition: A | Discharge status: Home / Self Care | Payer: Medicare Other | Source: Ambulatory Visit | Attending: Plastic Surgery | Admitting: Plastic Surgery

## 2014-04-21 ENCOUNTER — Ambulatory Visit (HOSPITAL_COMMUNITY): Payer: Medicare Other | Admitting: Anesthesiology

## 2014-04-21 ENCOUNTER — Encounter (HOSPITAL_COMMUNITY): Payer: Medicare Other | Admitting: Anesthesiology

## 2014-04-21 ENCOUNTER — Encounter (HOSPITAL_COMMUNITY)
Admission: RE | Disposition: A | Discharge status: Home / Self Care | Payer: Self-pay | Source: Ambulatory Visit | Attending: Plastic Surgery

## 2014-04-21 DIAGNOSIS — S81009A Unspecified open wound, unspecified knee, initial encounter: Secondary | ICD-10-CM | POA: Insufficient documentation

## 2014-04-21 DIAGNOSIS — I509 Heart failure, unspecified: Secondary | ICD-10-CM | POA: Diagnosis not present

## 2014-04-21 DIAGNOSIS — K219 Gastro-esophageal reflux disease without esophagitis: Secondary | ICD-10-CM | POA: Insufficient documentation

## 2014-04-21 DIAGNOSIS — G4730 Sleep apnea, unspecified: Secondary | ICD-10-CM | POA: Diagnosis not present

## 2014-04-21 DIAGNOSIS — Z7982 Long term (current) use of aspirin: Secondary | ICD-10-CM | POA: Diagnosis not present

## 2014-04-21 DIAGNOSIS — M1710 Unilateral primary osteoarthritis, unspecified knee: Secondary | ICD-10-CM | POA: Insufficient documentation

## 2014-04-21 DIAGNOSIS — IMO0002 Reserved for concepts with insufficient information to code with codable children: Secondary | ICD-10-CM

## 2014-04-21 DIAGNOSIS — Y838 Other surgical procedures as the cause of abnormal reaction of the patient, or of later complication, without mention of misadventure at the time of the procedure: Secondary | ICD-10-CM | POA: Insufficient documentation

## 2014-04-21 DIAGNOSIS — D649 Anemia, unspecified: Secondary | ICD-10-CM | POA: Insufficient documentation

## 2014-04-21 DIAGNOSIS — L97123 Non-pressure chronic ulcer of left thigh with necrosis of muscle: Secondary | ICD-10-CM

## 2014-04-21 DIAGNOSIS — E785 Hyperlipidemia, unspecified: Secondary | ICD-10-CM | POA: Insufficient documentation

## 2014-04-21 DIAGNOSIS — S81809A Unspecified open wound, unspecified lower leg, initial encounter: Principal | ICD-10-CM

## 2014-04-21 DIAGNOSIS — Z87891 Personal history of nicotine dependence: Secondary | ICD-10-CM | POA: Insufficient documentation

## 2014-04-21 DIAGNOSIS — Z79899 Other long term (current) drug therapy: Secondary | ICD-10-CM | POA: Diagnosis not present

## 2014-04-21 DIAGNOSIS — I10 Essential (primary) hypertension: Secondary | ICD-10-CM | POA: Diagnosis not present

## 2014-04-21 DIAGNOSIS — S91009A Unspecified open wound, unspecified ankle, initial encounter: Principal | ICD-10-CM

## 2014-04-21 DIAGNOSIS — Z86718 Personal history of other venous thrombosis and embolism: Secondary | ICD-10-CM | POA: Insufficient documentation

## 2014-04-21 HISTORY — PX: INCISION AND DRAINAGE OF WOUND: SHX1803

## 2014-04-21 HISTORY — DX: Polyneuropathy, unspecified: G62.9

## 2014-04-21 LAB — GLUCOSE, CAPILLARY
Glucose-Capillary: 99 mg/dL (ref 70–99)
Glucose-Capillary: 120 mg/dL — ABNORMAL HIGH (ref 70–99)

## 2014-04-21 LAB — CBC
HCT: 27.7 % — ABNORMAL LOW (ref 36.0–46.0)
Hemoglobin: 9 g/dL — ABNORMAL LOW (ref 12.0–15.0)
MCH: 28.9 pg (ref 26.0–34.0)
MCHC: 32.5 g/dL (ref 30.0–36.0)
MCV: 89.1 fL (ref 78.0–100.0)
PLATELETS: 219 10*3/uL (ref 150–400)
RBC: 3.11 MIL/uL — ABNORMAL LOW (ref 3.87–5.11)
RDW: 15.9 % — AB (ref 11.5–15.5)
WBC: 5.6 10*3/uL (ref 4.0–10.5)

## 2014-04-21 SURGERY — IRRIGATION AND DEBRIDEMENT WOUND
Anesthesia: General | Site: Leg Upper | Laterality: Left

## 2014-04-21 MED ORDER — 0.9 % SODIUM CHLORIDE (POUR BTL) OPTIME
TOPICAL | Status: DC | PRN
  Administered 2014-04-21: 1000 mL

## 2014-04-21 MED ORDER — FENTANYL CITRATE 0.05 MG/ML IJ SOLN
INTRAMUSCULAR | Status: AC
  Filled 2014-04-21: qty 2

## 2014-04-21 MED ORDER — PROPOFOL 10 MG/ML IV BOLUS
INTRAVENOUS | Status: DC | PRN
  Administered 2014-04-21 (×2): 10 mg via INTRAVENOUS

## 2014-04-21 MED ORDER — LIDOCAINE HCL (CARDIAC) 20 MG/ML IV SOLN
INTRAVENOUS | Status: AC
  Filled 2014-04-21: qty 5

## 2014-04-21 MED ORDER — MIDAZOLAM HCL 5 MG/5ML IJ SOLN
INTRAMUSCULAR | Status: DC | PRN
  Administered 2014-04-21: 1 mg via INTRAVENOUS

## 2014-04-21 MED ORDER — FENTANYL CITRATE 0.05 MG/ML IJ SOLN
INTRAMUSCULAR | Status: AC
  Filled 2014-04-21: qty 5

## 2014-04-21 MED ORDER — ONDANSETRON HCL 4 MG/2ML IJ SOLN
INTRAMUSCULAR | Status: AC
  Filled 2014-04-21: qty 2

## 2014-04-21 MED ORDER — FENTANYL CITRATE 0.05 MG/ML IJ SOLN
25.0000 ug | INTRAMUSCULAR | Status: DC | PRN
  Administered 2014-04-21: 50 ug via INTRAVENOUS

## 2014-04-21 MED ORDER — FENTANYL CITRATE 0.05 MG/ML IJ SOLN
INTRAMUSCULAR | Status: DC | PRN
Start: 2014-04-21 — End: 2014-04-21
  Administered 2014-04-21: 50 ug via INTRAVENOUS

## 2014-04-21 MED ORDER — SODIUM CHLORIDE 0.9 % IV SOLN
INTRAVENOUS | Status: DC
  Administered 2014-04-21: 09:00:00 via INTRAVENOUS

## 2014-04-21 MED ORDER — ONDANSETRON HCL 4 MG/2ML IJ SOLN
INTRAMUSCULAR | Status: DC | PRN
  Administered 2014-04-21: 4 mg via INTRAVENOUS

## 2014-04-21 MED ORDER — LIDOCAINE HCL (CARDIAC) 20 MG/ML IV SOLN
INTRAVENOUS | Status: DC | PRN
  Administered 2014-04-21: 30 mg via INTRAVENOUS

## 2014-04-21 MED ORDER — MIDAZOLAM HCL 2 MG/2ML IJ SOLN
INTRAMUSCULAR | Status: AC
  Filled 2014-04-21: qty 2

## 2014-04-21 SURGICAL SUPPLY — 44 items
APL SKNCLS STERI-STRIP NONHPOA (GAUZE/BANDAGES/DRESSINGS) ×1
BAG DECANTER FOR FLEXI CONT (MISCELLANEOUS) ×2 IMPLANT
BENZOIN TINCTURE PRP APPL 2/3 (GAUZE/BANDAGES/DRESSINGS) ×2 IMPLANT
BLADE SURG ROTATE 9660 (MISCELLANEOUS) IMPLANT
BNDG GAUZE ELAST 4 BULKY (GAUZE/BANDAGES/DRESSINGS) IMPLANT
CANISTER SUCTION 2500CC (MISCELLANEOUS) ×3 IMPLANT
CHLORAPREP W/TINT 26ML (MISCELLANEOUS) IMPLANT
CONT SPEC STER OR (MISCELLANEOUS) IMPLANT
COVER SURGICAL LIGHT HANDLE (MISCELLANEOUS) ×3 IMPLANT
DRAPE INCISE IOBAN 66X45 STRL (DRAPES) ×2 IMPLANT
DRAPE PED LAPAROTOMY (DRAPES) ×3 IMPLANT
DRAPE PROXIMA HALF (DRAPES) IMPLANT
DRSG ADAPTIC 3X8 NADH LF (GAUZE/BANDAGES/DRESSINGS) ×2 IMPLANT
DRSG PAD ABDOMINAL 8X10 ST (GAUZE/BANDAGES/DRESSINGS) ×1 IMPLANT
DRSG VAC ATS LRG SENSATRAC (GAUZE/BANDAGES/DRESSINGS) ×2 IMPLANT
DRSG VAC ATS MED SENSATRAC (GAUZE/BANDAGES/DRESSINGS) IMPLANT
DRSG VAC ATS SM SENSATRAC (GAUZE/BANDAGES/DRESSINGS) IMPLANT
ELECT CAUTERY BLADE 6.4 (BLADE) ×3 IMPLANT
ELECT REM PT RETURN 9FT ADLT (ELECTROSURGICAL) ×3
ELECTRODE REM PT RTRN 9FT ADLT (ELECTROSURGICAL) ×1 IMPLANT
GAUZE SPONGE 4X4 12PLY STRL (GAUZE/BANDAGES/DRESSINGS) ×1 IMPLANT
GLOVE BIO SURGEON STRL SZ 6.5 (GLOVE) ×2 IMPLANT
GLOVE BIO SURGEONS STRL SZ 6.5 (GLOVE) ×1
GLOVE BIOGEL PI IND STRL 7.0 (GLOVE) IMPLANT
GLOVE BIOGEL PI INDICATOR 7.0 (GLOVE) ×2
GLOVE SURG SS PI 7.0 STRL IVOR (GLOVE) ×2 IMPLANT
GOWN STRL REUS W/ TWL LRG LVL3 (GOWN DISPOSABLE) ×2 IMPLANT
GOWN STRL REUS W/TWL LRG LVL3 (GOWN DISPOSABLE) ×6
KIT BASIN OR (CUSTOM PROCEDURE TRAY) ×3 IMPLANT
KIT ROOM TURNOVER OR (KITS) ×3 IMPLANT
MATRIX SURGICAL PSM 7X10CM (Tissue) ×2 IMPLANT
MICROMATRIX 1000MG (Tissue) ×3 IMPLANT
NS IRRIG 1000ML POUR BTL (IV SOLUTION) ×3 IMPLANT
PACK GENERAL/GYN (CUSTOM PROCEDURE TRAY) ×3 IMPLANT
PAD ARMBOARD 7.5X6 YLW CONV (MISCELLANEOUS) ×6 IMPLANT
SET CYSTO W/LG BORE CLAMP LF (SET/KITS/TRAYS/PACK) ×2 IMPLANT
SOLUTION PARTIC MCRMTRX 1000MG (Tissue) IMPLANT
SURGILUBE 2OZ TUBE FLIPTOP (MISCELLANEOUS) IMPLANT
SUT VIC AB 5-0 PS2 18 (SUTURE) IMPLANT
SWAB COLLECTION DEVICE MRSA (MISCELLANEOUS) IMPLANT
TOWEL OR 17X24 6PK STRL BLUE (TOWEL DISPOSABLE) ×3 IMPLANT
TOWEL OR 17X26 10 PK STRL BLUE (TOWEL DISPOSABLE) ×3 IMPLANT
TUBE ANAEROBIC SPECIMEN COL (MISCELLANEOUS) IMPLANT
UNDERPAD 30X30 INCONTINENT (UNDERPADS AND DIAPERS) ×3 IMPLANT

## 2014-04-21 NOTE — Anesthesia Preprocedure Evaluation (Addendum)
Anesthesia Evaluation  Patient identified by MRN, date of birth, ID band Patient awake    Reviewed: Allergy & Precautions, H&P , NPO status , Patient's Chart, lab work & pertinent test results, reviewed documented beta blocker date and time   History of Anesthesia Complications Negative for: history of anesthetic complications  Airway Mallampati: II TM Distance: >3 FB Neck ROM: Full    Dental  (+) Poor Dentition, Missing, Dental Advisory Given, Partial Upper, Partial Lower,    Pulmonary sleep apnea , former smoker,  breath sounds clear to auscultation        Cardiovascular hypertension, Pt. on medications and Pt. on home beta blockers + Peripheral Vascular Disease and +CHF     Neuro/Psych    GI/Hepatic Neg liver ROS, GERD-  Medicated and Controlled,  Endo/Other  diabetes, Poorly Controlled, Type 2Morbid obesity  Renal/GU Renal InsufficiencyRenal disease     Musculoskeletal   Abdominal   Peds  Hematology  (+) anemia ,   Anesthesia Other Findings   Reproductive/Obstetrics negative OB ROS                       Anesthesia Physical Anesthesia Plan  ASA: III  Anesthesia Plan: General and MAC   Post-op Pain Management:    Induction: Intravenous  Airway Management Planned: LMA and Natural Airway  Additional Equipment:   Intra-op Plan:   Post-operative Plan: Extubation in OR  Informed Consent: I have reviewed the patients History and Physical, chart, labs and discussed the procedure including the risks, benefits and alternatives for the proposed anesthesia with the patient or authorized representative who has indicated his/her understanding and acceptance.   Dental advisory given  Plan Discussed with: CRNA, Anesthesiologist and Surgeon  Anesthesia Plan Comments:        Anesthesia Quick Evaluation

## 2014-04-21 NOTE — Op Note (Signed)
Operative Note   DATE OF OPERATION: 04/21/2014  LOCATION: [facility] OR outpatient  SURGICAL DIVISION: Plastic Surgery  PREOPERATIVE DIAGNOSES:  Large left leg wound  POSTOPERATIVE DIAGNOSES:  same  PROCEDURE:  Preparation of wound bed for placement of Acell (1 gm powder and sheet 7 x 10 cm) and VAC placement (15 x 3 x 1.3 cm)  SURGEON: Jisela Merlino Sanger, DO  ANESTHESIA:  General.   COMPLICATIONS: None.   INDICATIONS FOR PROCEDURE:  The patient, Kelly [name] is a 68 y.o. female born on 1946/04/26, is here for treatment of left leg wound. MRN: [id number]  CONSENT:  Informed consent was obtained directly from the patient. Risks, benefits and alternatives were fully discussed. Specific risks including but not limited to bleeding, infection, hematoma, seroma, scarring, pain, infection, contracture, asymmetry, wound healing problems, and need for further surgery were all discussed. The patient did have an ample opportunity to have questions answered to satisfaction.   DESCRIPTION OF PROCEDURE:  The patient was taken to the operating room. The patient's operative site was prepped and draped in a sterile fashion. A time out was performed and all information was confirmed to be correct.  Sedation was administered.  The area was irrigated.   Hemostasis was achieved with pressure.  The Acell powder was placed followed by the Acell sheet.  The adaptic was applied and the VAC.  There was an excellent seal.  The patient tolerated the procedure well.  There were no complications. The patient was allowed to wake and taken to the recovery room in satisfactory condition.

## 2014-04-21 NOTE — Brief Op Note (Signed)
04/21/2014  10:52 AM  PATIENT:  Kelly [name]  68 y.o. female  PRE-OPERATIVE DIAGNOSIS:  OPEN WOUND LEFT LEG  POST-OPERATIVE DIAGNOSIS:  OPEN WOUND LEFT LEG  PROCEDURE:  Procedure(s): IRRIGATION AND DEBRIDEMENT OF LEFT LOWER LEG WOUND WITH PLACEMENT OF ACELL/VAC (Left)  SURGEON:  Surgeon(s) and Role:    * Veer Elamin Sanger, DO - Primary  PHYSICIAN ASSISTANT: Shawn Rayburn, PA  ASSISTANTS: none   ANESTHESIA:  sedation  EBL:     BLOOD ADMINISTERED:none  DRAINS: none   LOCAL MEDICATIONS USED:  NONE  SPECIMEN:  No Specimen  DISPOSITION OF SPECIMEN:  N/A  COUNTS:  YES  TOURNIQUET:  * No tourniquets in log *  DICTATION: .Dragon Dictation  PLAN OF CARE: Discharge to home after PACU  PATIENT DISPOSITION:  PACU - hemodynamically stable.   Delay start of Pharmacological VTE agent (>24hrs) due to surgical blood loss or risk of bleeding: no

## 2014-04-21 NOTE — Interval H&P Note (Signed)
History and Physical Interval Note:  04/21/2014 8:32 AM  Kelly [name]  has presented today for surgery, with the diagnosis of OPEN WOUND OF LEFT LOWER LEG  The various methods of treatment have been discussed with the patient and family. After consideration of risks, benefits and other options for treatment, the patient has consented to  Procedure(s): IRRIGATION AND DEBRIDEMENT OF LEFT LOWER LEG WOUND WITH PLACEMENT OF ACELL/VAC (Left) as a surgical intervention .  The patient's history has been reviewed, patient examined, no change in status, stable for surgery.  I have reviewed the patient's chart and labs.  Questions were answered to the patient's satisfaction.     SANGER,[name]

## 2014-04-21 NOTE — H&P (View-Only) (Signed)
Kelly [name] is an 67 y.o. female.   Chief complaint: left thigh ulcer HPI: The patient is a 67 yrs old bf here for treatment of her left thigh ulcer.  She underwent a panniculectomy type procedure and was sent to a nursing facility.  She developed a DVT and anticoagulaton was started.  She bleed into the wound and had to be taken to the OR for I and D.  The area was debrided and Acell with the VAC was placed.  She has been taken to the OR for a VAC change and Acell placement several times and presents today for another VAC change with placement of Acell. She has a greenfield filter in place.  Past Medical History  Diagnosis Date  . Hypertension   . Hyperlipidemia   . CHF (congestive heart failure)   . GERD (gastroesophageal reflux disease)   . Depression   . Sleep apnea     doesn't use cpap machine or 02 at night  . Headache(784.0)     Migraines  . Arthritis     knees  . Anemia     Had blood transfusion x 2 in yhe 90s  . Lymphedema of leg   . Pulmonary embolism 02/18/14    diagnosed at [facility] with CT angio chest  . Chronic indwelling Foley catheter   . Diabetes mellitus     not on medications    Past Surgical History  Procedure Laterality Date  . Rotator cuff repair      right  . Knee arthroscopy      right  . Temple biopsy    . Colonoscopy with propofol  09/23/2012    Procedure: COLONOSCOPY WITH PROPOFOL;  Surgeon: Jay M Pyrtle, MD;  Location: [facility];  Service: Gastroenterology;  Laterality: N/A;  . Paniculectomy with wound debridement Left 02/08/2014    left medial thigh  . Irrigation and debridement abscess Left 02/24/2014    Procedure: MINOR INCISION AND DRAINAGE OF ABSCESS;  Surgeon: Osiel Stick Sanger, DO;  Location: [facility];  Service: Plastics;  Laterality: Left;  . I&d extremity Left 03/03/2014    Procedure: IRRIGATION AND DEBRIDEMENT LEFT LEG WOUND AND PLACEMENT OF A CELL AND VAC;  Surgeon: Dejanique Ruehl Sanger, DO;  Location: [facility] OR;  Service: Plastics;  Laterality: Left;  .  Incision and drainage of wound Left 03/16/2014    Procedure: IRRIGATION AND DEBRIDEMENT OF LEFT THIGH WOUND, APPLICATION ACELL;  Surgeon: Brinda Thimmappa, MD;  Location: [facility] OR;  Service: Plastics;  Laterality: Left;  . Incision and drainage of wound Left 03/25/2014    Procedure: IRRIGATION AND DEBRIDEMENT OF LEFT LEG WOUND WITH PLACEMENT OF ACELL/VAC;  Surgeon: Rashel Okeefe Sanger, DO;  Location: [facility];  Service: Plastics;  Laterality: Left;    Family History  Problem Relation Age of Onset  . Emphysema Father   . Asthma Brother   . Heart disease Mother   . Stomach cancer Brother   . Heart disease Maternal Grandmother   . Diabetes Mother   . Diabetes Sister    Social History:  reports that she quit smoking about 32 years ago. Her smoking use included Cigarettes. She smoked 0.00 packs per day. She has never used smokeless tobacco. She reports that she does not drink alcohol or use illicit drugs.  Allergies:  Allergies  Allergen Reactions  . Bactrim [Sulfamethoxazole-Tmp Ds] Other (See Comments)    Hyperkalemia (July 2015)  . Penicillins Hives    Medications Prior to Admission  Medication Sig Dispense Refill  .   allopurinol (ZYLOPRIM) 100 MG tablet Take 100 mg by mouth daily.       . aspirin EC 325 MG tablet Take 325 mg by mouth daily.      . atorvastatin (LIPITOR) 40 MG tablet Take 40 mg by mouth daily.       . chlorthalidone (HYGROTON) 25 MG tablet Take 12.5 mg by mouth daily.      . doxycycline (VIBRAMYCIN) 100 MG capsule Take 100 mg by mouth 2 (two) times daily. For 7 days      . esomeprazole (NEXIUM) 40 MG capsule Take 40 mg by mouth daily before breakfast. Take on an empty stomach      . gabapentin (NEURONTIN) 300 MG capsule Take 300 mg by mouth 3 (three) times daily.      . isosorbide mononitrate (ISMO,MONOKET) 10 MG tablet Take 10 mg by mouth 2 (two) times daily.      . magnesium oxide (MAG-OX) 400 MG tablet Take 400 mg by mouth daily.       . metoprolol tartrate (LOPRESSOR) 25 MG  tablet Take 37.5 mg by mouth 2 (two) times daily.       . Oxycodone HCl 10 MG TABS Take 10 mg by mouth every 4 (four) hours as needed (for pain).      . polyethylene glycol (MIRALAX / GLYCOLAX) packet Take 17 g by mouth daily. Mix with 4-6 oz liquid      . promethazine (PHENERGAN) 25 MG tablet Take 25 mg by mouth 2 (two) times daily as needed for nausea or vomiting.      . zolpidem (AMBIEN CR) 6.25 MG CR tablet Take 6.25 mg by mouth at bedtime as needed for sleep.        Results for orders placed during the hospital encounter of 03/31/14 (from the past 48 hour(s))  GLUCOSE, CAPILLARY     Status: Abnormal   Collection Time    03/31/14 12:51 PM      Result Value Ref Range   Glucose-Capillary 132 (*) 70 - 99 mg/dL  BASIC METABOLIC PANEL     Status: Abnormal   Collection Time    03/31/14  1:01 PM      Result Value Ref Range   Sodium 139  137 - 147 mEq/L   Potassium 4.7  3.7 - 5.3 mEq/L   Chloride 106  96 - 112 mEq/L   CO2 21  19 - 32 mEq/L   Glucose, Bld 124 (*) 70 - 99 mg/dL   BUN 21  6 - 23 mg/dL   Creatinine, Ser 1.18 (*) 0.50 - 1.10 mg/dL   Calcium 8.4  8.4 - 10.5 mg/dL   GFR calc non Af Amer 47 (*) >90 mL/min   GFR calc Af Amer 54 (*) >90 mL/min   Comment: (NOTE)     The eGFR has been calculated using the CKD EPI equation.     This calculation has not been validated in all clinical situations.     eGFR's persistently <90 mL/min signify possible Chronic Kidney     Disease.   Anion gap 12  5 - 15  CBC     Status: Abnormal   Collection Time    03/31/14  1:01 PM      Result Value Ref Range   WBC 7.1  4.0 - 10.5 K/uL   RBC 3.21 (*) 3.87 - 5.11 MIL/uL   Hemoglobin 9.4 (*) 12.0 - 15.0 g/dL   HCT 29.8 (*) 36.0 - 46.0 %   MCV   92.8  78.0 - 100.0 fL   MCH 29.3  26.0 - 34.0 pg   MCHC 31.5  30.0 - 36.0 g/dL   RDW 16.0 (*) 11.5 - 15.5 %   Platelets 208  150 - 400 K/uL   No results found.  ROS  Blood pressure 146/46, pulse 61, temperature 97.9 F (36.6 C), temperature source  Oral, resp. rate 18, height 5' 4" (1.626 m), weight 146.512 kg (323 lb), SpO2 100.00%. Physical Exam   Assessment/Plan Irrigation and debridement of left hip ulcer with Acell and VAC placement.  SANGER,Council [place] 03/31/2014, 3:22 PM    

## 2014-04-21 NOTE — Transfer of Care (Signed)
Immediate Anesthesia Transfer of Care Note  Patient: Kelly [name]  Procedure(s) Performed: Procedure(s): IRRIGATION AND DEBRIDEMENT OF LEFT UPPER LEG WOUND WITH PLACEMENT OF ACELL/VAC (Left)  Patient Location: PACU  Anesthesia Type:MAC  Level of Consciousness: awake, alert , oriented and patient cooperative  Airway & Oxygen Therapy: Patient Spontanous Breathing  Post-op Assessment: Report given to PACU RN and Post -op Vital signs reviewed and stable  Post vital signs: Reviewed  Complications: No apparent anesthesia complications

## 2014-04-21 NOTE — Anesthesia Postprocedure Evaluation (Signed)
  Anesthesia Post-op Note  Patient: Kelly [name]  Procedure(s) Performed: Procedure(s): IRRIGATION AND DEBRIDEMENT OF LEFT UPPER LEG WOUND WITH PLACEMENT OF ACELL/VAC (Left)  Patient Location: PACU  Anesthesia Type:MAC  Level of Consciousness: awake  Airway and Oxygen Therapy: Patient Spontanous Breathing  Post-op Pain: mild  Post-op Assessment: Post-op Vital signs reviewed, Patient's Cardiovascular Status Stable, Respiratory Function Stable, Patent Airway, No signs of Nausea or vomiting and Pain level controlled  Post-op Vital Signs: Reviewed and stable  Last Vitals:  Filed Vitals:   04/21/14 1130  BP: 145/58  Pulse: 65  Temp: 36.4 C  Resp: 15    Complications: No apparent anesthesia complications

## 2014-04-21 NOTE — Anesthesia Procedure Notes (Signed)
Procedure Name: MAC Date/Time: 04/21/2014 10:35 AM Performed by: Jenne Campus Pre-anesthesia Checklist: Patient identified, Emergency Drugs available, Suction available, Patient being monitored and Timeout performed Patient Re-evaluated:Patient Re-evaluated prior to inductionOxygen Delivery Method: Nasal cannula

## 2014-04-21 NOTE — Discharge Instructions (Signed)
Continue VAC at 125 mmHg pressure continuous Do not change VAC

## 2014-04-23 ENCOUNTER — Encounter (HOSPITAL_COMMUNITY): Payer: Self-pay | Admitting: Plastic Surgery

## 2014-04-26 ENCOUNTER — Encounter (HOSPITAL_BASED_OUTPATIENT_CLINIC_OR_DEPARTMENT_OTHER): Payer: Medicare Other | Attending: Plastic Surgery

## 2014-04-26 DIAGNOSIS — E119 Type 2 diabetes mellitus without complications: Secondary | ICD-10-CM | POA: Insufficient documentation

## 2014-04-26 DIAGNOSIS — L97109 Non-pressure chronic ulcer of unspecified thigh with unspecified severity: Secondary | ICD-10-CM | POA: Diagnosis not present

## 2014-04-26 DIAGNOSIS — I872 Venous insufficiency (chronic) (peripheral): Secondary | ICD-10-CM | POA: Diagnosis present

## 2014-04-26 DIAGNOSIS — L97509 Non-pressure chronic ulcer of other part of unspecified foot with unspecified severity: Secondary | ICD-10-CM | POA: Insufficient documentation

## 2014-04-26 NOTE — Discharge Summary (Signed)
Physician Discharge Summary  Kelly [name] TFT:[id number] DOB: 05-13-1946 DOA: 03/12/2014  PCP: Alvester Chou, NP  Admit date: 03/12/2014 Discharge date: 03/12/14  Time spent: 45  minutes   1.   Discharge Diagnoses:  Active Problems:   Hyperkalemia   Discharge Condition: Stable    Filed Weights   03/12/14 0812  Weight: 146.682 kg (323 lb 6 oz)    History of present illness:  68 year old female with history of chronic diastolic heart failure, hypertension, chronic left leg wound, diabetes Mellitus, GERD, has h/o PE, not on anticoagulation secondary to bleeding from the left leg wound, s/o IVC filter placement, chronic anemia, morbid obesity came in for debridement of the left leg wound by Dr Ashley Mariner. Patient earlier was admitted on 03/03/2014 for wound VAC placement. At that time patient was diagnosis hyperkalemia and was admitted to [facility] service. Hyperkalemia at that time was admitted to Bactrim which was discontinued. Patient was discharged to [facility] rehabilitation facility on doxycycline and Cipro. Today patient came to the hospital for debridement of the chronic left leg wound, and the preop labs again revealed hyperkalemia with potassium 6.2.  [facility] consulted for admission and further management of hyperkalemia.  Patient denies any symptoms, no chest pain shortness of breath, no vomiting or diarrhea. She does complain of intermittent nausea.  Nursing home records reviewed, shows Bactrim twice a day on the medication list. Called and confirmed with nursing home the patient was not given Bactrim since 03/05/2014.   Hospital Course:  Patient was going to be admitted with hyperkalemia, but was never assigned a bed later nurse called that repeat BMP istat showed normal potassium, of 4.7, previous potassium of 6.2 was a lab error, patient still in short stay and has not been admitted. Admission cancelled and patient to return to SNF.    Discharge Exam: Filed Vitals:   03/12/14  0812  BP: 139/43  Pulse: 66  Temp: 97.7 F (36.5 C)  Resp: 20      Discharge Instructions You were cared for by a hospitalist during your hospital stay. If you have any questions about your discharge medications or the care you received while you were in the hospital after you are discharged, you can call the unit and asked to speak with the hospitalist on call if the hospitalist that took care of you is not available. Once you are discharged, your primary care physician will handle any further medical issues. Please note that NO REFILLS for any discharge medications will be authorized once you are discharged, as it is imperative that you return to your primary care physician (or establish a relationship with a primary care physician if you do not have one) for your aftercare needs so that they can reassess your need for medications and monitor your lab values.     Medication List    ASK your doctor about these medications       allopurinol 100 MG tablet  Commonly known as:  ZYLOPRIM  Take 100 mg by mouth daily.     atorvastatin 40 MG tablet  Commonly known as:  LIPITOR  Take 40 mg by mouth daily.     chlorthalidone 25 MG tablet  Commonly known as:  HYGROTON  Take 12.5 mg by mouth daily.     ciprofloxacin 500 MG tablet  Commonly known as:  CIPRO  Take 1 tablet (500 mg total) by mouth 2 (two) times daily.     doxycycline 100 MG tablet  Commonly known as:  VIBRA-TABS  Take 1 tablet (100 mg total) by mouth every 12 (twelve) hours.     esomeprazole 40 MG capsule  Commonly known as:  NEXIUM  Take 40 mg by mouth daily before breakfast. Take on an empty stomach     gabapentin 300 MG capsule  Commonly known as:  NEURONTIN  Take 300 mg by mouth 3 (three) times daily.     isosorbide mononitrate 10 MG tablet  Commonly known as:  ISMO,MONOKET  Take 10 mg by mouth 2 (two) times daily.     magnesium oxide 400 MG tablet  Commonly known as:  MAG-OX  Take 400 mg by mouth daily.      metoprolol tartrate 25 MG tablet  Commonly known as:  LOPRESSOR  Take 37.5 mg by mouth 2 (two) times daily.     polyethylene glycol packet  Commonly known as:  MIRALAX / GLYCOLAX  Take 17 g by mouth daily. Mix with 4-6 oz liquid     promethazine 25 MG tablet  Commonly known as:  PHENERGAN  Take 25 mg by mouth 2 (two) times daily as needed for nausea or vomiting.     zolpidem 6.25 MG CR tablet  Commonly known as:  AMBIEN CR  Take 6.25 mg by mouth at bedtime as needed for sleep.       Allergies  Allergen Reactions  . Bactrim [Sulfamethoxazole-Tmp Ds] Other (See Comments)    Hyperkalemia (July 2015)  . Penicillins Hives      The results of significant diagnostics from this hospitalization (including imaging, microbiology, ancillary and laboratory) are listed below for reference.    Significant Diagnostic Studies: Dg Ankle Complete Left  04/19/14   CLINICAL DATA:  Left heel pain radiating up into the left leg.  EXAM: LEFT ANKLE COMPLETE - 3+ VIEW  COMPARISON:  No priors.  FINDINGS: Multiple views of the left ankle demonstrate no acute displaced fracture, subluxation, dislocation, or soft tissue abnormality. Enthesophytes are noted off the dorsal and plantar aspect of the calcaneus. Numerous vascular calcifications are noted. Diffuse soft tissue edema of the visualized lower extremity.  IMPRESSION: 1. No acute radiographic abnormality of the left ankle. 2. Plantar and dorsal calcaneal spurs. 3. Atherosclerosis.   Electronically Signed   By: Vinnie Langton M.D.   On: 04/19/2014 23:22   Dg Foot 2 Views Left  04/14/2014   CLINICAL DATA:  Possible great toe osteomyelitis.  EXAM: LEFT FOOT - 2 VIEW  COMPARISON:  None.  FINDINGS: There is no evidence of fracture or dislocation. Vascular calcifications are noted. No definite lytic destruction is seen to suggest osteomyelitis. Spurring of posterior calcaneus is noted. Swelling of dorsal soft tissues is noted suggesting cellulitis.   IMPRESSION: Swelling of dorsal soft tissues is seen suggesting cellulitis. No lytic destruction is seen to suggest osteomyelitis. However, MRI would be more sensitive for the detection of osteomyelitis.   Electronically Signed   By: Sabino Dick M.D.   On: 04/14/2014 15:29    Microbiology: No results found for this or any previous visit (from the past 240 hour(s)).   Labs: Basic Metabolic Panel: No results found for this basename: NA, K, CL, CO2, GLUCOSE, BUN, CREATININE, CALCIUM, MG, PHOS,  in the last 168 hours Liver Function Tests: No results found for this basename: AST, ALT, ALKPHOS, BILITOT, PROT, ALBUMIN,  in the last 168 hours No results found for this basename: LIPASE, AMYLASE,  in the last 168 hours No results found for this basename: AMMONIA,  in the last 168  hours CBC:  Recent Labs Lab 04/21/14 0913  WBC 5.6  HGB 9.0*  HCT 27.7*  MCV 89.1  PLT 219   Cardiac Enzymes: No results found for this basename: CKTOTAL, CKMB, CKMBINDEX, TROPONINI,  in the last 168 hours BNP: BNP (last 3 results) No results found for this basename: PROBNP,  in the last 8760 hours CBG:  Recent Labs Lab 04/21/14 0900 04/21/14 1104  GLUCAP 99 120*       Signed:  Minal Stuller S  [facility] 04/26/2014, 7:26 PM

## 2014-04-27 NOTE — Progress Notes (Signed)
[facility]  NAME:  Kelly [name], Kelly [name]               ACCOUNT NO.:  [id number]  MEDICAL RECORD NO.:  [id number]      DATE OF BIRTH:  1946-08-18  PHYSICIAN:  Theodoro Kos, DO       VISIT DATE:  04/26/2014                                  OFFICE VISIT   HISTORY:  The patient is a 68 year old female who is here for a followup on her left thigh ulcer.  She has been undergoing treatment in the hospital with ACell and VAC changes.  She underwent an excision of a pannus at [facility].  She got a PE, was put on anticoagulation.  She bled and the whole thing opened up, so she has been getting treatments for this.  She is doing extremely well with a decrease in overall size. There has been no change in her social history.  She is still in a nursing facility.  She has a new wound on her left toe and this is being treated with triple antibiotic, dressing changes, and doxycycline.  She has a new wound on her right thigh as well and we will treat this with collagen.  Her breathing is unlabored.  Her heart rate is regular.  Her abdomen is very large, but soft.  The wound does not show any signs of infection and overall is healing very well.  We will continue with the VAC.  We will use collagen.  I will see her back in 2 weeks.     Theodoro Kos, DO     CS/MEDQ  D:  04/26/2014  T:  04/27/2014  Job:  [id number]

## 2014-04-28 ENCOUNTER — Non-Acute Institutional Stay (SKILLED_NURSING_FACILITY): Payer: Medicare Other | Admitting: Internal Medicine

## 2014-04-28 DIAGNOSIS — N179 Acute kidney failure, unspecified: Secondary | ICD-10-CM

## 2014-04-29 ENCOUNTER — Other Ambulatory Visit: Payer: Self-pay | Admitting: *Deleted

## 2014-04-29 MED ORDER — OXYCODONE HCL 10 MG PO TABS: ORAL_TABLET | ORAL | Status: DC

## 2014-04-29 NOTE — Telephone Encounter (Signed)
[facility] 

## 2014-05-05 ENCOUNTER — Non-Acute Institutional Stay (SKILLED_NURSING_FACILITY): Payer: Medicare Other | Admitting: Internal Medicine

## 2014-05-05 DIAGNOSIS — I5032 Chronic diastolic (congestive) heart failure: Secondary | ICD-10-CM

## 2014-05-05 DIAGNOSIS — K5900 Constipation, unspecified: Secondary | ICD-10-CM

## 2014-05-05 DIAGNOSIS — N183 Chronic kidney disease, stage 3 unspecified: Secondary | ICD-10-CM

## 2014-05-05 DIAGNOSIS — E1349 Other specified diabetes mellitus with other diabetic neurological complication: Secondary | ICD-10-CM

## 2014-05-05 DIAGNOSIS — E0843 Diabetes mellitus due to underlying condition with diabetic autonomic (poly)neuropathy: Secondary | ICD-10-CM

## 2014-05-05 DIAGNOSIS — G909 Disorder of the autonomic nervous system, unspecified: Secondary | ICD-10-CM

## 2014-05-06 NOTE — Progress Notes (Signed)
Patient ID: Kelly [name], female   DOB: 1946/01/16, 68 y.o.   MRN: [id number]           PROGRESS NOTE  DATE: 04/28/2014         FACILITY:  [facility]  LEVEL OF CARE: SNF (31)  Acute Visit  CHIEF COMPLAINT:  Manage acute renal failure.    HISTORY OF PRESENT ILLNESS: I was requested by the staff to assess the patient regarding above problem(s):  On 04/20/2014:  Patient's BUN is 18, creatinine 1.26.  On 04/16/2014:  Creatinine was 1.16.  Patient was started on Lasix.  She denies increasing lower extremity swelling or confusion.    PAST MEDICAL HISTORY : Reviewed.  No changes/see problem list  CURRENT MEDICATIONS: Reviewed per MAR/see medication list  REVIEW OF SYSTEMS:  GENERAL: no change in appetite, no fatigue, no weight changes, no fever, chills or weakness RESPIRATORY: no cough, SOB, DOE,, wheezing, hemoptysis CARDIAC: no chest pain or palpitations;  chronic lower extremity swelling     GI: no abdominal pain, diarrhea, constipation, heart burn, nausea or vomiting  PHYSICAL EXAMINATION  VS: see VS section  GENERAL: no acute distress, morbidly obese body habitus NECK: supple, trachea midline, no neck masses, no thyroid tenderness, no thyromegaly RESPIRATORY: breathing is even & unlabored, BS CTAB CARDIAC: RRR, no murmur,no extra heart sounds, left lower extremity has +3 edema, right lower extremity has +2 edema        GI: abdomen soft, normal BS, no masses, no tenderness, no hepatomegaly, no splenomegaly PSYCHIATRIC: the patient is alert & oriented to person, affect & behavior appropriate  ASSESSMENT/PLAN:  Acute renal failure.  The patient has a history of chronic kidney disease, but renal functions are trending up after initiation of Lasix.  Therefore, recheck a BMP.  We will continue Lasix at current dosing.    CPT CODE: 40981            Gayani Y Dasanayaka, [place] [phone]

## 2014-05-07 ENCOUNTER — Non-Acute Institutional Stay (SKILLED_NURSING_FACILITY): Payer: Medicare Other | Admitting: Internal Medicine

## 2014-05-07 DIAGNOSIS — T8189XS Other complications of procedures, not elsewhere classified, sequela: Secondary | ICD-10-CM

## 2014-05-07 DIAGNOSIS — T889XXS Complication of surgical and medical care, unspecified, sequela: Secondary | ICD-10-CM

## 2014-05-07 DIAGNOSIS — B353 Tinea pedis: Secondary | ICD-10-CM

## 2014-05-07 NOTE — Progress Notes (Deleted)
         PROGRESS NOTE  DATE: 05/05/2014  FACILITY: Nursing Home Location: [facility]  LEVEL OF CARE: SNF (31)  Routine Visit  CHIEF COMPLAINT:  Manage C., chronic kidney disease and diabetes mellitus  HISTORY OF PRESENT ILLNESS:  REASSESSMENT OF ONGOING PROBLEM(S):  PAST MEDICAL HISTORY : Reviewed.  No changes/see problem list  CURRENT MEDICATIONS: Reviewed per MAR/see medication list  REVIEW OF SYSTEMS:  GENERAL: no change in appetite, no fatigue, no weight changes, no fever, chills or weakness RESPIRATORY: no cough, SOB, DOE, wheezing, hemoptysis CARDIAC: no chest pain, edema or palpitations GI: no abdominal pain, diarrhea, constipation, heart burn, nausea or vomiting  PHYSICAL EXAMINATION  VS:  See VS section  GENERAL: no acute distress, normal body habitus EYES: conjunctivae normal, sclerae normal, normal eye lids NECK: supple, trachea midline, no neck masses, no thyroid tenderness, no thyromegaly LYMPHATICS: no LAN in the neck, no supraclavicular LAN RESPIRATORY: breathing is even & unlabored, BS CTAB CARDIAC: RRR, no murmur,no extra heart sounds, no edema GI: abdomen soft, normal BS, no masses, no tenderness, no hepatomegaly, no splenomegaly PSYCHIATRIC: the patient is alert & oriented to person, affect & behavior appropriate  LABS/RADIOLOGY:  ASSESSMENT/PLAN:  CPT CODE: [place], MD [place] [phone]

## 2014-05-07 NOTE — Progress Notes (Signed)
         PROGRESS NOTE  DATE: 05-05-14  FACILITY: Nursing Home Location: [facility]  LEVEL OF CARE: SNF (31)  Routine Visit  CHIEF COMPLAINT:  Manage constipation, neuropathy and CHF  HISTORY OF PRESENT ILLNESS:  REASSESSMENT OF ONGOING PROBLEM(S):  CHF:The patient does not relate significant weight changes, denies orthopnea, PNDs, palpitations or chest pain.  CHF is unstable.  No complications form the medications being used. Complains of chronic lower extremity swelling. Patient is complaining of increased shortness of breath and abdominal swelling.  CONSTIPATION: The constipation is unstable. No complications from the medications presently being used. Patient complains of ongoing constipation, but denies abdominal pain, nausea or vomiting.  PERIPHERAL NEUROPATHY: The peripheral neuropathy is stable. The patient denies pain in the feet, tingling, and numbness. No complications noted from the medication presently being used.  PAST MEDICAL HISTORY : Reviewed.  No changes/see problem list  CURRENT MEDICATIONS: Reviewed per MAR/see medication list  REVIEW OF SYSTEMS:  GENERAL: no change in appetite, no fatigue, no weight changes, no fever, chills or weakness RESPIRATORY: no cough, wheezing, hemoptysis, see history of present illness CARDIAC: no chest pain,  or palpitations, complains of chronic lower extremity swelling, see history of present illness GI: no abdominal pain, diarrhea, constipation, heart burn, nausea or vomiting  PHYSICAL EXAMINATION  VS:  See VS section  GENERAL: no acute distress, morbidly obese body habitus EYES: conjunctivae normal, sclerae normal, normal eye lids NECK: supple, trachea midline, no neck masses, no thyroid tenderness, no thyromegaly LYMPHATICS: no LAN in the neck, no supraclavicular LAN RESPIRATORY: breathing is even & unlabored, BS CTAB CARDIAC: RRR, no murmur,no extra heart sounds, +3 left lower extremity edema, +2  right lower extremity edema GI: abdomen soft, normal BS, no masses, no tenderness, no hepatomegaly, no splenomegaly PSYCHIATRIC: the patient is alert & oriented to person, affect & behavior appropriate  LABS/RADIOLOGY:  9-15 BUN 20, creatinine 1.13 8-15 creatinine 1.13 otherwise BMP normal, hemoglobin 8.1, MCV 91 otherwise CBC normal, total protein 5.7, albumin 2.4, alkaline phosphatase 161 otherwise liver profile normal, magnesium level 1.7 7-15 hemoglobin 8.2, MCV 91 otherwise CBC normal, BMP normal  ASSESSMENT/PLAN:  Constipation-well controlled Neuropathy-continue Neurontin CHF-unstable. Increase Lasix to 40 mg daily. Chronic kidney disease-renal functions improved.  Hypokalemia-increase KCL to 20 mEq daily. CAD-stable GERD-continue Nexium Gout-continue allopurinol Hyperlipidemia-check fasting lipid panel Hypomagnesemia-continue supplementation. Well repleated. Diabetes mellitus-check hemoglobin A1c. Diet controlled Left lower extremity wound-status post debridement. Continue wound care. Check BMP on 05/10/14  CPT CODE: 29924  Gayani Y Dasanayaka, MD [facility] [phone]

## 2014-05-09 ENCOUNTER — Inpatient Hospital Stay (HOSPITAL_COMMUNITY)
Admission: EM | Admit: 2014-05-09 | Discharge: 2014-05-14 | DRG: 872 | Disposition: A | Discharge status: Skilled Nursing Facility | Payer: Medicare Other | Attending: Internal Medicine | Admitting: Internal Medicine

## 2014-05-09 ENCOUNTER — Emergency Department (HOSPITAL_COMMUNITY): Payer: Medicare Other

## 2014-05-09 ENCOUNTER — Encounter (HOSPITAL_COMMUNITY): Payer: Self-pay | Admitting: Emergency Medicine

## 2014-05-09 ENCOUNTER — Inpatient Hospital Stay (HOSPITAL_COMMUNITY): Payer: Medicare Other

## 2014-05-09 DIAGNOSIS — Z833 Family history of diabetes mellitus: Secondary | ICD-10-CM | POA: Diagnosis not present

## 2014-05-09 DIAGNOSIS — I452 Bifascicular block: Secondary | ICD-10-CM | POA: Diagnosis present

## 2014-05-09 DIAGNOSIS — D509 Iron deficiency anemia, unspecified: Secondary | ICD-10-CM | POA: Diagnosis present

## 2014-05-09 DIAGNOSIS — I498 Other specified cardiac arrhythmias: Secondary | ICD-10-CM | POA: Diagnosis present

## 2014-05-09 DIAGNOSIS — R000 Tachycardia, unspecified: Secondary | ICD-10-CM

## 2014-05-09 DIAGNOSIS — B369 Superficial mycosis, unspecified: Secondary | ICD-10-CM | POA: Diagnosis present

## 2014-05-09 DIAGNOSIS — I10 Essential (primary) hypertension: Secondary | ICD-10-CM | POA: Diagnosis present

## 2014-05-09 DIAGNOSIS — D62 Acute posthemorrhagic anemia: Secondary | ICD-10-CM

## 2014-05-09 DIAGNOSIS — E875 Hyperkalemia: Secondary | ICD-10-CM | POA: Diagnosis present

## 2014-05-09 DIAGNOSIS — E872 Acidosis, unspecified: Secondary | ICD-10-CM | POA: Diagnosis present

## 2014-05-09 DIAGNOSIS — I129 Hypertensive chronic kidney disease with stage 1 through stage 4 chronic kidney disease, or unspecified chronic kidney disease: Secondary | ICD-10-CM | POA: Diagnosis present

## 2014-05-09 DIAGNOSIS — T83511A Infection and inflammatory reaction due to indwelling urethral catheter, initial encounter: Secondary | ICD-10-CM

## 2014-05-09 DIAGNOSIS — M109 Gout, unspecified: Secondary | ICD-10-CM | POA: Diagnosis present

## 2014-05-09 DIAGNOSIS — E1142 Type 2 diabetes mellitus with diabetic polyneuropathy: Secondary | ICD-10-CM | POA: Diagnosis present

## 2014-05-09 DIAGNOSIS — Z880 Allergy status to penicillin: Secondary | ICD-10-CM | POA: Diagnosis not present

## 2014-05-09 DIAGNOSIS — R609 Edema, unspecified: Secondary | ICD-10-CM

## 2014-05-09 DIAGNOSIS — L02419 Cutaneous abscess of limb, unspecified: Secondary | ICD-10-CM

## 2014-05-09 DIAGNOSIS — I509 Heart failure, unspecified: Secondary | ICD-10-CM | POA: Diagnosis present

## 2014-05-09 DIAGNOSIS — L03119 Cellulitis of unspecified part of limb: Secondary | ICD-10-CM | POA: Diagnosis not present

## 2014-05-09 DIAGNOSIS — J40 Bronchitis, not specified as acute or chronic: Secondary | ICD-10-CM | POA: Diagnosis present

## 2014-05-09 DIAGNOSIS — G4733 Obstructive sleep apnea (adult) (pediatric): Secondary | ICD-10-CM | POA: Diagnosis present

## 2014-05-09 DIAGNOSIS — I150 Renovascular hypertension: Secondary | ICD-10-CM

## 2014-05-09 DIAGNOSIS — L97529 Non-pressure chronic ulcer of other part of left foot with unspecified severity: Secondary | ICD-10-CM

## 2014-05-09 DIAGNOSIS — F329 Major depressive disorder, single episode, unspecified: Secondary | ICD-10-CM | POA: Diagnosis present

## 2014-05-09 DIAGNOSIS — E11621 Type 2 diabetes mellitus with foot ulcer: Secondary | ICD-10-CM

## 2014-05-09 DIAGNOSIS — Z800 Family history of malignant neoplasm of digestive organs: Secondary | ICD-10-CM

## 2014-05-09 DIAGNOSIS — D649 Anemia, unspecified: Secondary | ICD-10-CM | POA: Diagnosis not present

## 2014-05-09 DIAGNOSIS — Z7901 Long term (current) use of anticoagulants: Secondary | ICD-10-CM | POA: Diagnosis not present

## 2014-05-09 DIAGNOSIS — N189 Chronic kidney disease, unspecified: Secondary | ICD-10-CM

## 2014-05-09 DIAGNOSIS — N390 Urinary tract infection, site not specified: Secondary | ICD-10-CM

## 2014-05-09 DIAGNOSIS — T502X5A Adverse effect of carbonic-anhydrase inhibitors, benzothiadiazides and other diuretics, initial encounter: Secondary | ICD-10-CM | POA: Diagnosis present

## 2014-05-09 DIAGNOSIS — N179 Acute kidney failure, unspecified: Secondary | ICD-10-CM | POA: Diagnosis present

## 2014-05-09 DIAGNOSIS — E559 Vitamin D deficiency, unspecified: Secondary | ICD-10-CM | POA: Diagnosis present

## 2014-05-09 DIAGNOSIS — Z825 Family history of asthma and other chronic lower respiratory diseases: Secondary | ICD-10-CM

## 2014-05-09 DIAGNOSIS — I872 Venous insufficiency (chronic) (peripheral): Secondary | ICD-10-CM | POA: Diagnosis present

## 2014-05-09 DIAGNOSIS — Z79899 Other long term (current) drug therapy: Secondary | ICD-10-CM | POA: Diagnosis not present

## 2014-05-09 DIAGNOSIS — D638 Anemia in other chronic diseases classified elsewhere: Secondary | ICD-10-CM | POA: Diagnosis present

## 2014-05-09 DIAGNOSIS — K219 Gastro-esophageal reflux disease without esophagitis: Secondary | ICD-10-CM | POA: Diagnosis present

## 2014-05-09 DIAGNOSIS — I2510 Atherosclerotic heart disease of native coronary artery without angina pectoris: Secondary | ICD-10-CM | POA: Diagnosis present

## 2014-05-09 DIAGNOSIS — Z86711 Personal history of pulmonary embolism: Secondary | ICD-10-CM | POA: Diagnosis not present

## 2014-05-09 DIAGNOSIS — Z86718 Personal history of other venous thrombosis and embolism: Secondary | ICD-10-CM | POA: Diagnosis not present

## 2014-05-09 DIAGNOSIS — M129 Arthropathy, unspecified: Secondary | ICD-10-CM | POA: Diagnosis present

## 2014-05-09 DIAGNOSIS — D631 Anemia in chronic kidney disease: Secondary | ICD-10-CM

## 2014-05-09 DIAGNOSIS — Z836 Family history of other diseases of the respiratory system: Secondary | ICD-10-CM

## 2014-05-09 DIAGNOSIS — N183 Chronic kidney disease, stage 3 unspecified: Secondary | ICD-10-CM

## 2014-05-09 DIAGNOSIS — I82409 Acute embolism and thrombosis of unspecified deep veins of unspecified lower extremity: Secondary | ICD-10-CM

## 2014-05-09 DIAGNOSIS — L97909 Non-pressure chronic ulcer of unspecified part of unspecified lower leg with unspecified severity: Secondary | ICD-10-CM | POA: Diagnosis present

## 2014-05-09 DIAGNOSIS — I5032 Chronic diastolic (congestive) heart failure: Secondary | ICD-10-CM

## 2014-05-09 DIAGNOSIS — B955 Unspecified streptococcus as the cause of diseases classified elsewhere: Secondary | ICD-10-CM | POA: Diagnosis present

## 2014-05-09 DIAGNOSIS — Z794 Long term (current) use of insulin: Secondary | ICD-10-CM

## 2014-05-09 DIAGNOSIS — Z6841 Body Mass Index (BMI) 40.0 and over, adult: Secondary | ICD-10-CM

## 2014-05-09 DIAGNOSIS — Z87891 Personal history of nicotine dependence: Secondary | ICD-10-CM | POA: Diagnosis not present

## 2014-05-09 DIAGNOSIS — E876 Hypokalemia: Secondary | ICD-10-CM | POA: Diagnosis present

## 2014-05-09 DIAGNOSIS — S71109A Unspecified open wound, unspecified thigh, initial encounter: Secondary | ICD-10-CM | POA: Diagnosis present

## 2014-05-09 DIAGNOSIS — E1149 Type 2 diabetes mellitus with other diabetic neurological complication: Secondary | ICD-10-CM

## 2014-05-09 DIAGNOSIS — F3289 Other specified depressive episodes: Secondary | ICD-10-CM | POA: Diagnosis present

## 2014-05-09 DIAGNOSIS — E662 Morbid (severe) obesity with alveolar hypoventilation: Secondary | ICD-10-CM | POA: Diagnosis present

## 2014-05-09 DIAGNOSIS — Z881 Allergy status to other antibiotic agents status: Secondary | ICD-10-CM | POA: Diagnosis not present

## 2014-05-09 DIAGNOSIS — L03116 Cellulitis of left lower limb: Secondary | ICD-10-CM

## 2014-05-09 DIAGNOSIS — I2699 Other pulmonary embolism without acute cor pulmonale: Secondary | ICD-10-CM | POA: Diagnosis present

## 2014-05-09 DIAGNOSIS — R7881 Bacteremia: Secondary | ICD-10-CM

## 2014-05-09 DIAGNOSIS — D72829 Elevated white blood cell count, unspecified: Secondary | ICD-10-CM

## 2014-05-09 DIAGNOSIS — A409 Streptococcal sepsis, unspecified: Secondary | ICD-10-CM | POA: Diagnosis present

## 2014-05-09 DIAGNOSIS — E1140 Type 2 diabetes mellitus with diabetic neuropathy, unspecified: Secondary | ICD-10-CM | POA: Diagnosis present

## 2014-05-09 DIAGNOSIS — Z8249 Family history of ischemic heart disease and other diseases of the circulatory system: Secondary | ICD-10-CM

## 2014-05-09 DIAGNOSIS — A419 Sepsis, unspecified organism: Secondary | ICD-10-CM

## 2014-05-09 DIAGNOSIS — K5900 Constipation, unspecified: Secondary | ICD-10-CM

## 2014-05-09 DIAGNOSIS — M79609 Pain in unspecified limb: Secondary | ICD-10-CM | POA: Diagnosis not present

## 2014-05-09 DIAGNOSIS — R509 Fever, unspecified: Secondary | ICD-10-CM | POA: Diagnosis present

## 2014-05-09 DIAGNOSIS — R011 Cardiac murmur, unspecified: Secondary | ICD-10-CM | POA: Diagnosis present

## 2014-05-09 DIAGNOSIS — I959 Hypotension, unspecified: Secondary | ICD-10-CM

## 2014-05-09 DIAGNOSIS — Z9849 Cataract extraction status, unspecified eye: Secondary | ICD-10-CM | POA: Diagnosis not present

## 2014-05-09 DIAGNOSIS — M79605 Pain in left leg: Secondary | ICD-10-CM

## 2014-05-09 DIAGNOSIS — R6520 Severe sepsis without septic shock: Secondary | ICD-10-CM

## 2014-05-09 DIAGNOSIS — E785 Hyperlipidemia, unspecified: Secondary | ICD-10-CM | POA: Diagnosis present

## 2014-05-09 DIAGNOSIS — E1129 Type 2 diabetes mellitus with other diabetic kidney complication: Secondary | ICD-10-CM

## 2014-05-09 LAB — URINALYSIS, ROUTINE W REFLEX MICROSCOPIC
Bilirubin Urine: NEGATIVE
GLUCOSE, UA: NEGATIVE mg/dL
HGB URINE DIPSTICK: NEGATIVE
Ketones, ur: NEGATIVE mg/dL
LEUKOCYTES UA: NEGATIVE
Nitrite: NEGATIVE
PH: 7.5 (ref 5.0–8.0)
Protein, ur: 100 mg/dL — AB
SPECIFIC GRAVITY, URINE: 1.015 (ref 1.005–1.030)
Urobilinogen, UA: 0.2 mg/dL (ref 0.0–1.0)

## 2014-05-09 LAB — COMPREHENSIVE METABOLIC PANEL
ALT: 5 U/L (ref 0–35)
AST: 6 U/L (ref 0–37)
Albumin: 1.1 g/dL — ABNORMAL LOW (ref 3.5–5.2)
Alkaline Phosphatase: 54 U/L (ref 39–117)
Anion gap: 11 (ref 5–15)
BUN: 10 mg/dL (ref 6–23)
CALCIUM: 4.2 mg/dL — AB (ref 8.4–10.5)
CO2: 12 mEq/L — ABNORMAL LOW (ref 19–32)
Chloride: 122 mEq/L — ABNORMAL HIGH (ref 96–112)
Creatinine, Ser: 0.55 mg/dL (ref 0.50–1.10)
GFR calc Af Amer: >90 mL/min (ref >90)
GFR calc non Af Amer: >90 mL/min (ref >90)
GLUCOSE: 82 mg/dL (ref 70–99)
POTASSIUM: 2.6 meq/L — AB (ref 3.7–5.3)
SODIUM: 145 meq/L (ref 137–147)
TOTAL PROTEIN: 3.7 g/dL — AB (ref 6.0–8.3)
Total Bilirubin: <0.2 mg/dL — ABNORMAL LOW (ref 0.3–1.2)

## 2014-05-09 LAB — CBC WITH DIFFERENTIAL/PLATELET
BASOS PCT: 0 % (ref 0–1)
Basophils Absolute: 0 10*3/uL (ref 0.0–0.1)
EOS ABS: 0 10*3/uL (ref 0.0–0.7)
EOS PCT: 0 % (ref 0–5)
HCT: 15.3 % — ABNORMAL LOW (ref 36.0–46.0)
HEMOGLOBIN: 4.9 g/dL — AB (ref 12.0–15.0)
LYMPHS PCT: 9 % — AB (ref 12–46)
Lymphs Abs: 0.6 10*3/uL — ABNORMAL LOW (ref 0.7–4.0)
MCH: 29.5 pg (ref 26.0–34.0)
MCHC: 32 g/dL (ref 30.0–36.0)
MCV: 92.2 fL (ref 78.0–100.0)
MONO ABS: 0.3 10*3/uL (ref 0.1–1.0)
Monocytes Relative: 5 % (ref 3–12)
Neutro Abs: 5.4 10*3/uL (ref 1.7–7.7)
Neutrophils Relative %: 86 % — ABNORMAL HIGH (ref 43–77)
Platelets: 105 10*3/uL — ABNORMAL LOW (ref 150–400)
RBC: 1.66 MIL/uL — AB (ref 3.87–5.11)
RDW: 15.1 % (ref 11.5–15.5)
WBC: 6.3 10*3/uL (ref 4.0–10.5)

## 2014-05-09 LAB — PREPARE RBC (CROSSMATCH)

## 2014-05-09 LAB — BASIC METABOLIC PANEL
Anion gap: 12 (ref 5–15)
BUN: 17 mg/dL (ref 6–23)
CHLORIDE: 103 meq/L (ref 96–112)
CO2: 21 mEq/L (ref 19–32)
Calcium: 8.3 mg/dL — ABNORMAL LOW (ref 8.4–10.5)
Creatinine, Ser: 1.13 mg/dL — ABNORMAL HIGH (ref 0.50–1.10)
GFR calc non Af Amer: 49 mL/min — ABNORMAL LOW (ref >90)
GFR, EST AFRICAN AMERICAN: 57 mL/min — AB (ref >90)
GLUCOSE: 184 mg/dL — AB (ref 70–99)
POTASSIUM: 5 meq/L (ref 3.7–5.3)
Sodium: 136 mEq/L — ABNORMAL LOW (ref 137–147)

## 2014-05-09 LAB — PROTIME-INR
INR: 1.28 (ref 0.00–1.49)
PROTHROMBIN TIME: 16 s — AB (ref 11.6–15.2)

## 2014-05-09 LAB — MRSA PCR SCREENING: MRSA by PCR: POSITIVE — AB

## 2014-05-09 LAB — IRON AND TIBC
Iron: 18 ug/dL — ABNORMAL LOW (ref 42–135)
Saturation Ratios: 8 % — ABNORMAL LOW (ref 20–55)
TIBC: 229 ug/dL — AB (ref 250–470)
UIBC: 211 ug/dL (ref 125–400)

## 2014-05-09 LAB — PHOSPHORUS: Phosphorus: 1.1 mg/dL — ABNORMAL LOW (ref 2.3–4.6)

## 2014-05-09 LAB — PROCALCITONIN: Procalcitonin: 0.85 ng/mL

## 2014-05-09 LAB — HEMOGLOBIN AND HEMATOCRIT, BLOOD
HCT: 32.2 % — ABNORMAL LOW (ref 36.0–46.0)
Hemoglobin: 10.7 g/dL — ABNORMAL LOW (ref 12.0–15.0)

## 2014-05-09 LAB — I-STAT CG4 LACTIC ACID, ED: Lactic Acid, Venous: 1.1 mmol/L (ref 0.5–2.2)

## 2014-05-09 LAB — URINE MICROSCOPIC-ADD ON

## 2014-05-09 LAB — MAGNESIUM: MAGNESIUM: 0.7 mg/dL — AB (ref 1.5–2.5)

## 2014-05-09 MED ORDER — IOHEXOL 300 MG/ML  SOLN
100.0000 mL | Freq: Once | INTRAMUSCULAR | Status: AC | PRN
  Administered 2014-05-09: 100 mL via INTRAVENOUS

## 2014-05-09 MED ORDER — SODIUM CHLORIDE 0.9 % IV SOLN
INTRAVENOUS | Status: DC
  Administered 2014-05-09 (×2): via INTRAVENOUS

## 2014-05-09 MED ORDER — VITAMIN D3 25 MCG (1000 UNIT) PO TABS
1000.0000 [IU] | ORAL_TABLET | Freq: Every day | ORAL | Status: DC
  Administered 2014-05-10 – 2014-05-14 (×5): 1000 [IU] via ORAL
  Filled 2014-05-09 (×8): qty 1

## 2014-05-09 MED ORDER — POTASSIUM CHLORIDE 10 MEQ/100ML IV SOLN
10.0000 meq | Freq: Once | INTRAVENOUS | Status: AC
Start: 2014-05-09 — End: 2014-05-09
  Administered 2014-05-09: 10 meq via INTRAVENOUS
  Filled 2014-05-09: qty 100

## 2014-05-09 MED ORDER — POTASSIUM CHLORIDE CRYS ER 20 MEQ PO TBCR
40.0000 meq | EXTENDED_RELEASE_TABLET | ORAL | Status: AC
  Administered 2014-05-09 (×2): 40 meq via ORAL
  Filled 2014-05-09 (×2): qty 2

## 2014-05-09 MED ORDER — INFLUENZA VAC SPLIT QUAD 0.5 ML IM SUSY
0.5000 mL | PREFILLED_SYRINGE | INTRAMUSCULAR | Status: DC
  Filled 2014-05-09 (×2): qty 0.5

## 2014-05-09 MED ORDER — POLYETHYLENE GLYCOL 3350 17 G PO PACK
17.0000 g | PACK | Freq: Every day | ORAL | Status: DC
  Administered 2014-05-09 – 2014-05-14 (×6): 17 g via ORAL
  Filled 2014-05-09 (×6): qty 1

## 2014-05-09 MED ORDER — ACETAMINOPHEN 650 MG RE SUPP: 650.0000 mg | Freq: Four times a day (QID) | RECTAL | Status: DC | PRN

## 2014-05-09 MED ORDER — SODIUM CHLORIDE 0.9 % IV SOLN
Freq: Once | INTRAVENOUS | Status: AC
Start: 2014-05-09 — End: 2014-05-09
  Administered 2014-05-09: 14:00:00 via INTRAVENOUS

## 2014-05-09 MED ORDER — DOCUSATE SODIUM 100 MG PO CAPS: 100.0000 mg | ORAL_CAPSULE | Freq: Two times a day (BID) | ORAL | Status: DC

## 2014-05-09 MED ORDER — MORPHINE SULFATE 2 MG/ML IJ SOLN
2.0000 mg | INTRAMUSCULAR | Status: DC | PRN
  Administered 2014-05-09 – 2014-05-13 (×14): 2 mg via INTRAVENOUS
  Filled 2014-05-09 (×16): qty 1

## 2014-05-09 MED ORDER — VANCOMYCIN HCL 10 G IV SOLR
2500.0000 mg | Freq: Once | INTRAVENOUS | Status: AC
  Administered 2014-05-09: 2500 mg via INTRAVENOUS
  Filled 2014-05-09: qty 2500

## 2014-05-09 MED ORDER — METOPROLOL TARTRATE 25 MG PO TABS
37.5000 mg | ORAL_TABLET | Freq: Two times a day (BID) | ORAL | Status: DC
  Administered 2014-05-09 – 2014-05-12 (×7): 37.5 mg via ORAL
  Filled 2014-05-09 (×17): qty 1

## 2014-05-09 MED ORDER — FUROSEMIDE 20 MG PO TABS
20.0000 mg | ORAL_TABLET | Freq: Every day | ORAL | Status: DC
  Administered 2014-05-09 – 2014-05-14 (×6): 20 mg via ORAL
  Filled 2014-05-09 (×6): qty 1

## 2014-05-09 MED ORDER — IRON POLYSACCH CMPLX-B12-FA 150-0.025-1 MG PO CAPS: 1.0000 | ORAL_CAPSULE | Freq: Every morning | ORAL | Status: DC

## 2014-05-09 MED ORDER — CALCIUM CITRATE 950 (200 CA) MG PO TABS
500.0000 mg | ORAL_TABLET | Freq: Four times a day (QID) | ORAL | Status: DC
  Administered 2014-05-09 – 2014-05-14 (×19): 500 mg via ORAL
  Filled 2014-05-09 (×22): qty 3

## 2014-05-09 MED ORDER — SODIUM CHLORIDE 0.9 % IV BOLUS (SEPSIS)
30.0000 mL/kg | Freq: Once | INTRAVENOUS | Status: AC
  Administered 2014-05-09: 4326 mL via INTRAVENOUS
  Filled 2014-05-09: qty 4350

## 2014-05-09 MED ORDER — IPRATROPIUM BROMIDE 0.02 % IN SOLN
0.5000 mg | Freq: Four times a day (QID) | RESPIRATORY_TRACT | Status: DC
  Administered 2014-05-09: 0.5 mg via RESPIRATORY_TRACT
  Filled 2014-05-09: qty 2.5

## 2014-05-09 MED ORDER — SODIUM CHLORIDE 0.9 % IV BOLUS (SEPSIS)
1000.0000 mL | Freq: Once | INTRAVENOUS | Status: AC
  Administered 2014-05-09: 1000 mL via INTRAVENOUS

## 2014-05-09 MED ORDER — FUROSEMIDE 10 MG/ML IJ SOLN
20.0000 mg | Freq: Once | INTRAMUSCULAR | Status: AC
  Administered 2014-05-09: 20 mg via INTRAVENOUS
  Filled 2014-05-09: qty 2

## 2014-05-09 MED ORDER — ALLOPURINOL 100 MG PO TABS
100.0000 mg | ORAL_TABLET | Freq: Every day | ORAL | Status: DC
  Administered 2014-05-09 – 2014-05-14 (×6): 100 mg via ORAL
  Filled 2014-05-09 (×6): qty 1

## 2014-05-09 MED ORDER — ACETAMINOPHEN 650 MG RE SUPP
975.0000 mg | Freq: Once | RECTAL | Status: AC
  Administered 2014-05-09: 975 mg via RECTAL
  Filled 2014-05-09 (×2): qty 1

## 2014-05-09 MED ORDER — ENOXAPARIN SODIUM 150 MG/ML ~~LOC~~ SOLN
140.0000 mg | Freq: Two times a day (BID) | SUBCUTANEOUS | Status: DC
  Administered 2014-05-09 – 2014-05-12 (×7): 140 mg via SUBCUTANEOUS
  Filled 2014-05-09 (×10): qty 1

## 2014-05-09 MED ORDER — ISOSORBIDE MONONITRATE 10 MG PO TABS
10.0000 mg | ORAL_TABLET | Freq: Two times a day (BID) | ORAL | Status: DC
  Administered 2014-05-09 – 2014-05-14 (×10): 10 mg via ORAL
  Filled 2014-05-09 (×11): qty 1

## 2014-05-09 MED ORDER — ACETAMINOPHEN 325 MG PO TABS
650.0000 mg | ORAL_TABLET | Freq: Four times a day (QID) | ORAL | Status: DC | PRN
  Administered 2014-05-09 – 2014-05-10 (×2): 650 mg via ORAL
  Filled 2014-05-09 (×2): qty 2

## 2014-05-09 MED ORDER — LEVALBUTEROL HCL 0.63 MG/3ML IN NEBU
0.6300 mg | INHALATION_SOLUTION | Freq: Four times a day (QID) | RESPIRATORY_TRACT | Status: DC
  Administered 2014-05-09: 0.63 mg via RESPIRATORY_TRACT
  Filled 2014-05-09: qty 3

## 2014-05-09 MED ORDER — DEXTROSE 5 % IV SOLN
2.0000 g | Freq: Once | INTRAVENOUS | Status: AC
  Administered 2014-05-09: 2 g via INTRAVENOUS

## 2014-05-09 MED ORDER — POLYSACCHARIDE IRON COMPLEX 150 MG PO CAPS
150.0000 mg | ORAL_CAPSULE | Freq: Every day | ORAL | Status: DC
  Administered 2014-05-10 – 2014-05-14 (×5): 150 mg via ORAL
  Filled 2014-05-09 (×5): qty 1

## 2014-05-09 MED ORDER — ZOLPIDEM TARTRATE 5 MG PO TABS
5.0000 mg | ORAL_TABLET | Freq: Every evening | ORAL | Status: DC | PRN
  Administered 2014-05-13: 5 mg via ORAL
  Filled 2014-05-09: qty 1

## 2014-05-09 MED ORDER — SENNA 8.6 MG PO TABS
1.0000 | ORAL_TABLET | Freq: Two times a day (BID) | ORAL | Status: DC
  Administered 2014-05-09 – 2014-05-13 (×9): 8.6 mg via ORAL
  Filled 2014-05-09 (×9): qty 1

## 2014-05-09 MED ORDER — POLYETHYLENE GLYCOL 3350 17 G PO PACK
17.0000 g | PACK | Freq: Every day | ORAL | Status: DC | PRN
  Filled 2014-05-09: qty 1

## 2014-05-09 MED ORDER — MAGNESIUM SULFATE 4000MG/100ML IJ SOLN
4.0000 g | Freq: Once | INTRAMUSCULAR | Status: AC
  Administered 2014-05-09: 4 g via INTRAVENOUS
  Filled 2014-05-09: qty 100

## 2014-05-09 MED ORDER — ONDANSETRON HCL 4 MG/2ML IJ SOLN: 4.0000 mg | Freq: Four times a day (QID) | INTRAMUSCULAR | Status: DC | PRN

## 2014-05-09 MED ORDER — DOCUSATE SODIUM 100 MG PO CAPS
100.0000 mg | ORAL_CAPSULE | Freq: Two times a day (BID) | ORAL | Status: DC
  Administered 2014-05-09 – 2014-05-14 (×10): 100 mg via ORAL
  Filled 2014-05-09 (×12): qty 1

## 2014-05-09 MED ORDER — PANTOPRAZOLE SODIUM 40 MG PO TBEC
80.0000 mg | DELAYED_RELEASE_TABLET | Freq: Every day | ORAL | Status: DC
  Administered 2014-05-10 – 2014-05-13 (×4): 80 mg via ORAL
  Filled 2014-05-09 (×6): qty 2

## 2014-05-09 MED ORDER — INSULIN ASPART 100 UNIT/ML ~~LOC~~ SOLN
0.0000 [IU] | Freq: Three times a day (TID) | SUBCUTANEOUS | Status: DC
  Administered 2014-05-09 – 2014-05-11 (×5): 3 [IU] via SUBCUTANEOUS
  Administered 2014-05-11 – 2014-05-12 (×3): 2 [IU] via SUBCUTANEOUS
  Administered 2014-05-12 (×2): 3 [IU] via SUBCUTANEOUS
  Administered 2014-05-13 (×3): 2 [IU] via SUBCUTANEOUS

## 2014-05-09 MED ORDER — VANCOMYCIN HCL IN DEXTROSE 1-5 GM/200ML-% IV SOLN: 1000.0000 mg | Freq: Once | INTRAVENOUS | Status: DC

## 2014-05-09 MED ORDER — MAGNESIUM OXIDE 400 (241.3 MG) MG PO TABS
400.0000 mg | ORAL_TABLET | Freq: Every day | ORAL | Status: DC
  Administered 2014-05-09 – 2014-05-14 (×6): 400 mg via ORAL
  Filled 2014-05-09 (×6): qty 1

## 2014-05-09 MED ORDER — SODIUM CHLORIDE 0.9 % IV SOLN: 1000.0000 mL | INTRAVENOUS | Status: DC

## 2014-05-09 MED ORDER — SORBITOL 70 % SOLN: 30.0000 mL | Freq: Every day | Status: DC | PRN

## 2014-05-09 MED ORDER — LEVOFLOXACIN IN D5W 750 MG/150ML IV SOLN
750.0000 mg | Freq: Once | INTRAVENOUS | Status: AC
  Administered 2014-05-09: 750 mg via INTRAVENOUS
  Filled 2014-05-09: qty 150

## 2014-05-09 MED ORDER — LEVALBUTEROL HCL 0.63 MG/3ML IN NEBU: 0.6300 mg | INHALATION_SOLUTION | RESPIRATORY_TRACT | Status: DC | PRN

## 2014-05-09 MED ORDER — SODIUM BICARBONATE 8.4 % IV SOLN
INTRAVENOUS | Status: DC
  Administered 2014-05-09 – 2014-05-10 (×3): via INTRAVENOUS
  Filled 2014-05-09 (×3): qty 1000

## 2014-05-09 MED ORDER — LEVOFLOXACIN IN D5W 750 MG/150ML IV SOLN
750.0000 mg | INTRAVENOUS | Status: DC
  Filled 2014-05-09: qty 150

## 2014-05-09 MED ORDER — ATORVASTATIN CALCIUM 40 MG PO TABS
40.0000 mg | ORAL_TABLET | Freq: Every day | ORAL | Status: DC
  Administered 2014-05-09 – 2014-05-14 (×6): 40 mg via ORAL
  Filled 2014-05-09 (×6): qty 1

## 2014-05-09 MED ORDER — ONDANSETRON HCL 4 MG PO TABS
4.0000 mg | ORAL_TABLET | Freq: Four times a day (QID) | ORAL | Status: DC | PRN
  Administered 2014-05-13: 4 mg via ORAL
  Filled 2014-05-09: qty 1

## 2014-05-09 MED ORDER — CHLORHEXIDINE GLUCONATE CLOTH 2 % EX PADS
6.0000 | MEDICATED_PAD | Freq: Every day | CUTANEOUS | Status: AC
  Administered 2014-05-10 – 2014-05-14 (×5): 6 via TOPICAL

## 2014-05-09 MED ORDER — OXYCODONE HCL 5 MG PO TABS
5.0000 mg | ORAL_TABLET | ORAL | Status: DC | PRN
  Administered 2014-05-09 – 2014-05-13 (×8): 5 mg via ORAL
  Filled 2014-05-09 (×8): qty 1

## 2014-05-09 MED ORDER — HYDROMORPHONE HCL PF 1 MG/ML IJ SOLN
1.0000 mg | Freq: Once | INTRAMUSCULAR | Status: AC
  Administered 2014-05-09: 1 mg via INTRAVENOUS
  Filled 2014-05-09: qty 1

## 2014-05-09 MED ORDER — IPRATROPIUM BROMIDE 0.02 % IN SOLN: 0.5000 mg | RESPIRATORY_TRACT | Status: DC | PRN

## 2014-05-09 MED ORDER — VANCOMYCIN HCL 10 G IV SOLR
1500.0000 mg | Freq: Two times a day (BID) | INTRAVENOUS | Status: DC
  Administered 2014-05-10: 1500 mg via INTRAVENOUS
  Filled 2014-05-09 (×2): qty 1500

## 2014-05-09 MED ORDER — DEXTROSE 5 % IV SOLN
2.0000 g | Freq: Three times a day (TID) | INTRAVENOUS | Status: DC
  Administered 2014-05-09 – 2014-05-10 (×3): 2 g via INTRAVENOUS
  Filled 2014-05-09 (×5): qty 2

## 2014-05-09 MED ORDER — SODIUM CHLORIDE 0.9 % IJ SOLN
3.0000 mL | Freq: Two times a day (BID) | INTRAMUSCULAR | Status: DC
  Administered 2014-05-09 – 2014-05-10 (×2): 3 mL via INTRAVENOUS
  Administered 2014-05-11: 10 mL via INTRAVENOUS
  Administered 2014-05-11: 3 mL via INTRAVENOUS
  Administered 2014-05-12: 10 mL via INTRAVENOUS
  Administered 2014-05-13: 3 mL via INTRAVENOUS

## 2014-05-09 MED ORDER — MUPIROCIN 2 % EX OINT
1.0000 "application " | TOPICAL_OINTMENT | Freq: Two times a day (BID) | CUTANEOUS | Status: AC
  Administered 2014-05-09 – 2014-05-14 (×10): 1 via NASAL
  Filled 2014-05-09 (×2): qty 22

## 2014-05-09 MED ORDER — ENOXAPARIN SODIUM 150 MG/ML ~~LOC~~ SOLN: 1.0000 mg/kg | Freq: Two times a day (BID) | SUBCUTANEOUS | Status: DC

## 2014-05-09 NOTE — Procedures (Signed)
Central Venous Catheter Insertion Procedure Note Kelly [name] [id number] 1945-11-25  Procedure: Insertion of Central Venous Catheter Indications: Drug and/or fluid administration and Frequent blood sampling  Procedure Details Consent: Risks of procedure as well as the alternatives and risks of each were explained to the (patient/caregiver).  Consent for procedure obtained. Time Out: Verified patient identification, verified procedure, site/side was marked, verified correct patient position, special equipment/implants available, medications/allergies/relevent history reviewed, required imaging and test results available.  Performed  Maximum sterile technique was used including antiseptics, cap, gloves, gown, hand hygiene, mask and sheet. Skin prep: Chlorhexidine; local anesthetic administered A antimicrobial bonded/coated triple lumen catheter was placed in the right internal jugular vein using the Seldinger technique.  Ultrasound was used to verify the patency of the vein and for real time needle guidance.  Evaluation Blood flow good Complications: No apparent complications Patient did tolerate procedure well. Chest X-ray ordered to verify placement.  CXR: pending.  MCQUAID, DOUGLAS 05/09/2014, 5:14 PM

## 2014-05-09 NOTE — ED Notes (Signed)
Right wrist IV where blood is being transfused has infiltrated. Dressing applied. Blood switched to new site. Will attempt 2 nd IV insertion

## 2014-05-09 NOTE — ED Notes (Signed)
Pt returned from ST.

## 2014-05-09 NOTE — ED Notes (Signed)
MD Thompson at bedside.  

## 2014-05-09 NOTE — H&P (Signed)
[facility] History and Physical  Kelly [name] STM:[id number] DOB: Dec 06, 1945 DOA: 05/09/2014  Referring physician: Dr. Zenia Resides PCP: Alvester Chou, NP   Chief Complaint: Fever  HPI: Kelly [name] is a 68 y.o. female  Skilled Nursing home resident at [facility], with a past medical history of hypertension, obstructive sleep apnea, gastroesophageal reflux disease, hyperlipidemia, recent diagnosis of pulmonary embolism and DVT 02/18/2014 supposedly on chronic anticoagulation, diabetes mellitus, chronic kidney disease stage III, prior history of panniculectomy type procedure, left thigh ulcer being managed by Dr. Migdalia Dk in the wound care clinic status post multiple I and D and a cell with VAC placement , history of IVC filter who presented to the ED with fever. Patient states wound VAC was discontinued 2 days prior to admission as wound VAC wouldn't stay on and M.D. at the skilled nursing facility had it removed and wet-to-dry dressing changes were being applied. Patient endorses a one-day history of fevers, chills, bilateral leg pain, some shortness of breath, nausea. Patient denies any chest pain, no cough, no emesis, no abdominal pain, no dysuria, no weakness, no hematochezia, no hematemesis, no melena. Patient denies any diarrhea no constipation. Patient was seen in the emergency room chest x-ray which was done was consistent with bronchitis. Comprehensive metabolic profile done at a potassium of 2.6 chloride of 122 bicarbonate of 12 albumin of 1.1 otherwise was within normal limits. Lactic acid level was 1.10. CBC obtained had a hemoglobin of 4.9 and a platelet count of 105 otherwise is within normal limits. Urinalysis done was nitrite negative leukocytes negative. CT of the left lower extremity done showed extensive subcutaneous edema throughout the left lower extremity with a soft tissue wound in the medial aspect of the left eye there was no abscess, no osteomyelitis, no joint effusion, no  myositis. EKG showed a right bundle branch block and a left anterior fascicular block with a heart rate of 104. Patient was typed and crossed and a unit of packed red blood cells was being transfused in the emergency room. Patient was pan cultured and placed empirically on IV Levaquin, vancomycin, aztreonam. ED physician stated he spoke with critical care medicine was that the hospitalist admit the patient and they will consult. Hospitalist service was called for admission for further evaluation and management.    Review of Systems: As per history of present illness otherwise negative. Constitutional:  No weight loss, night sweats, fatigue.  HEENT:  No headaches, Difficulty swallowing,Tooth/dental problems,Sore throat,  No sneezing, itching, ear ache, nasal congestion, post nasal drip,  Cardio-vascular:  No chest pain, Orthopnea, PND, swelling in lower extremities, anasarca, dizziness, palpitations  GI:  No heartburn, indigestion, abdominal pain, nausea, vomiting, diarrhea, change in bowel habits, loss of appetite  Resp:  No shortness of breath with exertion or at rest. No excess mucus, no productive cough, No non-productive cough, No coughing up of blood.No change in color of mucus.No wheezing.No chest wall deformity  Skin:  no rash or lesions.  GU:  no dysuria, change in color of urine, no urgency or frequency. No flank pain.  Musculoskeletal:  No joint pain or swelling. No decreased range of motion. No back pain.  Psych:  No change in mood or affect. No depression or anxiety. No memory loss.   Past Medical History  Diagnosis Date  . Hypertension   . Hyperlipidemia   . CHF (congestive heart failure)   . GERD (gastroesophageal reflux disease)   . Depression   . Lymphedema of leg  left leg  . Pulmonary embolism 02/18/14    diagnosed at [facility] with CT angio chest  . Chronic indwelling Foley catheter   . Heart murmur     "just found out I have one" (04/07/2014)  . DVT (deep  venous thrombosis) 01/2014    LLE; "went from my leg to my lungs"  . Pneumonia 1990's X 2  . Sleep apnea     doesn't use cpap machine or 02 at night (04/07/2014)  . Anemia   . History of blood transfusion 1990's X 2    "when they did my surgeries"  . Migraines     "none for years now" (04/07/2014)  . Arthritis     "arms, legs" (04/07/2014)  . Gout   . Urinary incontinence   . Chronic kidney disease (CKD), stage III (moderate)     [name] 04/07/2014  . Neuropathy   . Diabetes mellitus     "at one time" (04/07/2014)   Past Surgical History  Procedure Laterality Date  . Shoulder open rotator cuff repair Right ~ 2000  . Knee arthroscopy Right ?1980's  . Temporal artery biopsy / ligation Right ?1990's  . Colonoscopy with propofol  09/23/2012    Procedure: COLONOSCOPY WITH PROPOFOL;  Surgeon: Jerene Bears, MD;  Location: [facility];  Service: Gastroenterology;  Laterality: N/A;  . Panniculectomy Left 02/08/2014    w/wound debridement @ medial thigh  . Irrigation and debridement abscess Left 02/24/2014    Procedure: MINOR INCISION AND DRAINAGE OF ABSCESS;  Surgeon: Theodoro Kos, DO;  Location: [facility];  Service: Plastics;  Laterality: Left;  . I&d extremity Left 03/03/2014    Procedure: IRRIGATION AND DEBRIDEMENT LEFT LEG WOUND AND PLACEMENT OF A CELL AND VAC;  Surgeon: Theodoro Kos, DO;  Location: [place];  Service: Plastics;  Laterality: Left;  . Incision and drainage of wound Left 03/16/2014    Procedure: IRRIGATION AND DEBRIDEMENT OF LEFT THIGH WOUND, APPLICATION ACELL;  Surgeon: Irene Limbo, MD;  Location: [place];  Service: Plastics;  Laterality: Left;  . Incision and drainage of wound Left 03/25/2014    Procedure: IRRIGATION AND DEBRIDEMENT OF LEFT LEG WOUND WITH PLACEMENT OF ACELL/VAC;  Surgeon: Theodoro Kos, DO;  Location: [place];  Service: Plastics;  Laterality: Left;  . Incision and drainage of wound Left 03/31/2014    Procedure: IRRIGATION AND DEBRIDEMENT LEFT LEG WOUND WITH PLACEMENT  OF A-CELL/VAC;  Surgeon: Theodoro Kos, DO;  Location: [place];  Service: Plastics;  Laterality: Left;  . Cataract extraction w/ intraocular lens implant Right 2014  . Vena cava filter placement  02/2014  . Incision and drainage of wound Left 04/07/2014    Procedure: IRRIGATION AND DEBRIDEMENT LEFT LEG WOUND WITH PLACEMENT OF A CELL AND VAC;  Surgeon: Theodoro Kos, DO;  Location: [place];  Service: Plastics;  Laterality: Left;  . Incision and drainage of wound Left 04/14/2014    Procedure: IRRIGATION AND DEBRIDEMENT WOUND;  Surgeon: Theodoro Kos, DO;  Location: [place];  Service: Plastics;  Laterality: Left;  . Application of a-cell of extremity Left 04/14/2014    Procedure: APPLICATION OF A-CELL OF EXTREMITY;  Surgeon: Theodoro Kos, DO;  Location: [place];  Service: Plastics;  Laterality: Left;  Marland Kitchen Minor application of wound vac Left 04/14/2014    Procedure: MINOR APPLICATION OF WOUND VAC;  Surgeon: Theodoro Kos, DO;  Location: [place];  Service: Plastics;  Laterality: Left;  . Incision and drainage of wound Left 04/21/2014    Procedure: IRRIGATION AND DEBRIDEMENT  OF LEFT UPPER LEG WOUND WITH PLACEMENT OF ACELL/VAC;  Surgeon: Theodoro Kos, DO;  Location: [place];  Service: Plastics;  Laterality: Left;   Social History:  reports that she quit smoking about 32 years ago. Her smoking use included Cigarettes. She has a 33 pack-year smoking history. She has never used smokeless tobacco. She reports that she drinks alcohol. She reports that she does not use illicit drugs.  Allergies  Allergen Reactions  . Bactrim [Sulfamethoxazole-Tmp Ds] Other (See Comments)    Hyperkalemia (July 2015)  . Penicillins Hives    Family History  Problem Relation Age of Onset  . Emphysema Father   . Asthma Brother   . Heart disease Mother   . Stomach cancer Brother   . Heart disease Maternal Grandmother   . Diabetes Mother   . Diabetes Sister      Prior to Admission medications   Medication Sig Start Date End Date  Taking? Authorizing Provider  acetaminophen (TYLENOL) 325 MG tablet Take 650 mg by mouth every 6 (six) hours as needed for fever.   Yes Historical Provider, MD  allopurinol (ZYLOPRIM) 100 MG tablet Take 100 mg by mouth daily.    Yes Historical Provider, MD  atorvastatin (LIPITOR) 40 MG tablet Take 40 mg by mouth daily.    Yes Historical Provider, MD  chlorthalidone (HYGROTON) 25 MG tablet Take 12.5 mg by mouth daily.   Yes Historical Provider, MD  docusate sodium (COLACE) 100 MG capsule Take 100 mg by mouth 2 (two) times daily.   Yes Historical Provider, MD  enoxaparin (LOVENOX) 150 MG/ML injection Inject 0.93 mLs (140 mg total) into the skin every 12 (twelve) hours. 04/09/14  Yes Marianne L [name], PA-C  furosemide (LASIX) 20 MG tablet Take 20 mg by mouth daily.   Yes Historical Provider, MD  insulin aspart (NOVOLOG) 100 UNIT/ML injection Inject 0-9 Units into the skin 3 (three) times daily before meals. 70-120=0 121-150=1 151-200=2 201-250=3 251-300=5 301-350=7 351-400=9   Yes Historical Provider, MD  Iron Polysacch Cmplx-B12-FA 150-0.025-1 MG CAPS Take 1 tablet by mouth every morning. 04/09/14  Yes Bobby Rumpf [name], PA-C  isosorbide mononitrate (ISMO,MONOKET) 10 MG tablet Take 10 mg by mouth 2 (two) times daily.   Yes Historical Provider, MD  magnesium oxide (MAG-OX) 400 MG tablet Take 400 mg by mouth daily.    Yes Historical Provider, MD  metoprolol tartrate (LOPRESSOR) 25 MG tablet Take 37.5 mg by mouth 2 (two) times daily.    Yes Historical Provider, MD  polyethylene glycol (MIRALAX / GLYCOLAX) packet Take 17 g by mouth daily. Mix with 4-6 oz liquid   Yes Historical Provider, MD  potassium chloride (K-DUR) 10 MEQ tablet Take 10 mEq by mouth daily.   Yes Historical Provider, MD  senna (SENOKOT) 8.6 MG TABS tablet Take 1 tablet (8.6 mg total) by mouth 2 (two) times daily. 04/09/14  Yes Bobby Rumpf [name], PA-C  ciprofloxacin (CIPRO) 500 MG tablet Take 1 tablet (500 mg total) by mouth 2 (two) times  daily. 04/14/14   Claire Sanger, DO  esomeprazole (NEXIUM) 40 MG capsule Take 40 mg by mouth daily before breakfast. Take on an empty stomach    Historical Provider, MD   Physical Exam: Filed Vitals:   05/09/14 1430 05/09/14 1435 05/09/14 1449 05/09/14 1500  BP: 155/65 167/69 176/73 176/71  Pulse: 101 108 104 103  Temp: 101 F (38.3 C) 101 F (38.3 C) 101.1 F (38.4 C) 101.1 F (38.4 C)  TempSrc: Core (Comment) Core (Comment)  Core (Comment) Temporal  Resp: 15 17 19 15   Height:      Weight:      SpO2: 98% 98% 98% 99%    Wt Readings from Last 3 Encounters:  05/09/14 144.244 kg (318 lb)  05/07/14 144.244 kg (318 lb)  04/29/14 144.244 kg (318 lb)    General:  Obese female laying on a gurney in no acute cardiopulmonary distress. Speaking in full sentences.  Eyes: PERRLA, EOMI, normal lids, irises & conjunctiva ENT: grossly normal hearing, lips & tongue Neck: no LAD, masses or thyromegaly Cardiovascular: RRR, no m/r/g. Left lower extremity with 2+ edema, erythema, warmth. Respiratory: Some coarse BS in bases. No wheezing. Abdomen: soft, ntnd, +BS Skin: Left inner thigh with a 8-10 cm wound which is open with good granulation tissue with no purulent discharge. Left lower extremity was, 2+ edema, tender to palpation. Musculoskeletal: Left lower extremity with 2+ edema, warmth, tenderness to palpation.  Psychiatric: grossly normal mood and affect, speech fluent and appropriate Neurologic: Alert and oriented x3. Cranial nerves II through XII are grossly intact. Sensation is intact. Visual fields are intact. Gait not tested secondary to safety.           Labs on Admission:  Basic Metabolic Panel:  Recent Labs Lab 05/09/14 1005  NA 145  K 2.6*  CL 122*  CO2 12*  GLUCOSE 82  BUN 10  CREATININE 0.55  CALCIUM 4.2*   Liver Function Tests:  Recent Labs Lab 05/09/14 1005  AST 6  ALT 5  ALKPHOS 54  BILITOT <0.2*  PROT 3.7*  ALBUMIN 1.1*   No results found for this  basename: LIPASE, AMYLASE,  in the last 168 hours No results found for this basename: AMMONIA,  in the last 168 hours CBC:  Recent Labs Lab 05/09/14 1005  WBC 6.3  NEUTROABS 5.4  HGB 4.9*  HCT 15.3*  MCV 92.2  PLT 105*   Cardiac Enzymes: No results found for this basename: CKTOTAL, CKMB, CKMBINDEX, TROPONINI,  in the last 168 hours  BNP (last 3 results) No results found for this basename: PROBNP,  in the last 8760 hours CBG: No results found for this basename: GLUCAP,  in the last 168 hours  Radiological Exams on Admission: Dg Chest Port 1 View  05/09/2014   CLINICAL DATA:  68 year old female with shortness of breath and fever.  EXAM: PORTABLE CHEST - 1 VIEW  COMPARISON:  03/03/2014  FINDINGS: Upper limits normal heart size again noted.  Diffuse peribronchial thickening is noted and increased.  No definite airspace disease, pleural effusion or pneumothorax noted.  No acute bony abnormalities are identified.  IMPRESSION: Increasing diffuse peribronchial thickening/bronchitis. No definite focal airspace opacity on this single view.   Electronically Signed   By: Hassan Rowan M.D.   On: 05/09/2014 11:34   Ct Extrem Lower W Cm Bil  05/09/2014   CLINICAL DATA:  Fever. Soft tissue wound in the left thigh with cellulitis.  EXAM: CT OF THE LOWER BILATERAL EXTREMITY WITH CONTRAST  TECHNIQUE: Multidetector CT imaging of the lower bilateral extremities was performed according to the standard protocol following intravenous contrast administration.  COMPARISON:  None.  CONTRAST:  161mL OMNIPAQUE IOHEXOL 300 MG/ML  SOLN  FINDINGS: There is extensive subcutaneous edema throughout the left lower extremity. No acute abnormality of the bones. 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 No osteomyelitis. No joint effusions.  There is no evidence of a soft  tissue abscess. There is similar but less extensive subcutaneous edema in the medial aspect of  the right thigh.  There is a focal retracted area in the skin surface of the medial aspect of the left thigh consistent with the wound.  Incidental note is made of multiple small dense nodular areas in the subcutaneous fat of the panniculus consistent with injection granulomas.  IMPRESSION: Extensive subcutaneous edema throughout the left lower extremity with a soft tissue wound in the medial aspect of the left thigh. There is no abscess, osteomyelitis, joint effusion, or myositis.   Electronically Signed   By: Rozetta Nunnery M.D.   On: 05/09/2014 13:43    EKG: Independently reviewed. 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 Heart rate of 104  Assessment/Plan Principal Problem:   Sepsis Active Problems:   HYPERTENSION   Diabetes mellitus type 2 with neurological manifestations   Obesity hypoventilation syndrome   Morbid obesity   Chronic diastolic heart failure   Ulcer of lower limb, unspecified   Chronic venous insufficiency   Pulmonary embolism   Sinus tachycardia   Chronic kidney disease, stage 3   Diabetic neuropathy   Esophageal reflux   Coronary atherosclerosis of native coronary artery   Cellulitis of left leg: Probable   Hypokalemia   Hypocalcemia   Anemia   Bronchitis: Per CXR   Acidosis   Open thigh wound  #1 sepsis Patient is present with temperature 104 heart rate was 127. Questionable etiology. May be secondary to left lower extremity cellulitis with open thigh wound in the setting of bronchitis as noted on chest x-ray. Will check blood cultures x2. Lactic acid level within normal limits. Check a pro calcitonin level. Urinalysis is nitrite negative leukocytes negative. Urine cultures are pending. Will place empirically on IV vancomycin, IV Levaquin, IV aztreonam. Consult with critical care medicine for further evaluation and management.  #2 left lower extremity cellulitis with open left thigh wound Check blood  cultures x2. Patient had a wound VAC on the left thigh wound up until 2 days prior to admission when she stated that the wound VAC wouldn't hold. Wound VAC was subsequently discontinued and wet to dry dressing changes were applied. Patient was to followup at the wound care center with Dr. Migdalia Dk of plastics. Will inform Dr. Migdalia Dk of patient's admission. Will place patient empirically on IV vancomycin, IV Levaquin and IV aztreonam.  #3 hypokalemia Likely secondary to diuretics. Check a magnesium level. Replete.  #4 hypocalcemia Questionable etiology. Check a magnesium level, check a PTH, check an ionized calcium, check a phosphorus level, check a 25-hydroxy vitamin D level, 1, 25 dihydroxy vitamin D level. Place on oral calcium citrate and oral vitamin D. Follow.  #5 severe anemia Patient denies any overt GI bleed. Check an anemia panel. Will transfuse 2 units packed red blood cells. Follow H&H.  #6 acidosis Questionable etiology. May secondary to acute infection. Patient has been pancultured. Patient with no respiratory distress. We'll place patient on a bicarbonate drip.  #7 type 2 diabetes Check a hemoglobin A1c. Place on a sliding scale insulin.  #8 recent history of PE/DVT/ status post IVC filter Diagnosed 02/18/2014. Patient states for the last week has not been receiving her Lovenox. We'll place patient back on full dose Lovenox. Follow.  #9 bronchitis Per chest x-ray. Place on IV Levaquin.  #10 sinus tachycardia Likely secondary to problem #1, 2, 5, 6.  #11 obesity hypoventilation syndrome/obstructive sleep apnea CPAP each bedtime.  #  12 chronic diastolic heart failure/coronary artery disease Stable. EKG with a right bundle branch block unchanged from prior EKG. Continue beta blocker, IMDUR, lasix  #13 hypertension Continue Lopressor and Imdur.  #14 gastroesophageal reflux disease PPI.  #15 chronic kidney disease stage III Stable.  #16 prophylaxis PPI for GI  prophylaxis. Lovenox for DVT prophylaxis.   Code Status: Full DVT Prophylaxis: Lovenox Family Communication: Updated patient and niece at bedside. Disposition Plan: Admit to step down.  Time spent: 75 mins  [facility] MD [facility] Pager [phone]  **Disclaimer: This note may have been dictated with voice recognition software. Similar sounding words can inadvertently be transcribed and this note may contain transcription errors which may not have been corrected upon publication of note.**

## 2014-05-09 NOTE — Consult Note (Signed)
PULMONARY  / CRITICAL CARE MEDICINE CONSULTATION   Name: Kelly [name] MRN: [id number] DOB: Jul 06, 1946    ADMISSION DATE:  05/09/2014 CONSULTATION DATE: 05/09/14  REQUESTING CLINICIAN: Dr. Grandville Silos PRIMARY SERVICE: [facility]  CHIEF COMPLAINT:  Sepsis  BRIEF PATIENT DESCRIPTION: 45yof with morbid obesity, diastolic CHF, HTN, DM and chronic left leg ulcer, DVT presents with cc of AMS, fevers, found to be septic likely 2/2 leg source.     SIGNIFICANT EVENTS / STUDIES:  05/09/14:  Admission 05/09/14:  Hg on admit was 4.9  LINES / TUBES: RIJ TLC:   05/09/14-->  CULTURES: 05/09/14:  Blood cx x 2 pending, urine cx pending  ANTIBIOTICS: Vanc: 05/09/14-->   Aztreonam: 05/09/14-->   Levaquin: 05/09/14-->    HISTORY OF PRESENT ILLNESS:  68yof with morbid obesity, diastolic CHF, HTN, DM and chronic left leg ulcer, DVT presents with cc of AMS, fevers, found to be septic likely 2/2 leg source.   To review, she had a panniculectomy in June and since then has had a slow healing L leg ulcer. Until this week, she had a wound vac on the wound but it was d/'ced this week.  She notes over the past 24 hours, she had decreased exercise tolerance.  Last night she was noted to be febrile and she was somewhat altered this am prompting presentation to the ED for further eval.  On admission, she was found to be anemic with Hg of 4.9.  She denies recent SOB, cough or chest pain.  No other new complaints.  No GI bleed.  No reports of bleeding elsewhere.    PAST MEDICAL HISTORY :  Past Medical History  Diagnosis Date  . Hypertension   . Hyperlipidemia   . CHF (congestive heart failure)   . GERD (gastroesophageal reflux disease)   . Depression   . Lymphedema of leg     left leg  . Pulmonary embolism 02/18/14    diagnosed at [facility] with CT angio chest  . Chronic indwelling Foley catheter   . Heart murmur     "just found out I have one" (04/07/2014)  . DVT (deep venous thrombosis) 01/2014    LLE; "went  from my leg to my lungs"  . Pneumonia 1990's X 2  . Sleep apnea     doesn't use cpap machine or 02 at night (04/07/2014)  . Anemia   . History of blood transfusion 1990's X 2    "when they did my surgeries"  . Migraines     "none for years now" (04/07/2014)  . Arthritis     "arms, legs" (04/07/2014)  . Gout   . Urinary incontinence   . Chronic kidney disease (CKD), stage III (moderate)     [name] 04/07/2014  . Neuropathy   . Diabetes mellitus     "at one time" (04/07/2014)   Past Surgical History  Procedure Laterality Date  . Shoulder open rotator cuff repair Right ~ 2000  . Knee arthroscopy Right ?1980's  . Temporal artery biopsy / ligation Right ?1990's  . Colonoscopy with propofol  09/23/2012    Procedure: COLONOSCOPY WITH PROPOFOL;  Surgeon: Jerene Bears, MD;  Location: [facility];  Service: Gastroenterology;  Laterality: N/A;  . Panniculectomy Left 02/08/2014    w/wound debridement @ medial thigh  . Irrigation and debridement abscess Left 02/24/2014    Procedure: MINOR INCISION AND DRAINAGE OF ABSCESS;  Surgeon: Theodoro Kos, DO;  Location: [facility];  Service: Plastics;  Laterality: Left;  . I&d extremity  Left 03/03/2014    Procedure: IRRIGATION AND DEBRIDEMENT LEFT LEG WOUND AND PLACEMENT OF A CELL AND VAC;  Surgeon: Theodoro Kos, DO;  Location: [place];  Service: Plastics;  Laterality: Left;  . Incision and drainage of wound Left 03/16/2014    Procedure: IRRIGATION AND DEBRIDEMENT OF LEFT THIGH WOUND, APPLICATION ACELL;  Surgeon: Irene Limbo, MD;  Location: [place];  Service: Plastics;  Laterality: Left;  . Incision and drainage of wound Left 03/25/2014    Procedure: IRRIGATION AND DEBRIDEMENT OF LEFT LEG WOUND WITH PLACEMENT OF ACELL/VAC;  Surgeon: Theodoro Kos, DO;  Location: [place];  Service: Plastics;  Laterality: Left;  . Incision and drainage of wound Left 03/31/2014    Procedure: IRRIGATION AND DEBRIDEMENT LEFT LEG WOUND WITH PLACEMENT OF A-CELL/VAC;  Surgeon: Theodoro Kos,  DO;  Location: [place];  Service: Plastics;  Laterality: Left;  . Cataract extraction w/ intraocular lens implant Right 2014  . Vena cava filter placement  02/2014  . Incision and drainage of wound Left 04/07/2014    Procedure: IRRIGATION AND DEBRIDEMENT LEFT LEG WOUND WITH PLACEMENT OF A CELL AND VAC;  Surgeon: Theodoro Kos, DO;  Location: [place];  Service: Plastics;  Laterality: Left;  . Incision and drainage of wound Left 04/14/2014    Procedure: IRRIGATION AND DEBRIDEMENT WOUND;  Surgeon: Theodoro Kos, DO;  Location: [place];  Service: Plastics;  Laterality: Left;  . Application of a-cell of extremity Left 04/14/2014    Procedure: APPLICATION OF A-CELL OF EXTREMITY;  Surgeon: Theodoro Kos, DO;  Location: [place];  Service: Plastics;  Laterality: Left;  Marland Kitchen Minor application of wound vac Left 04/14/2014    Procedure: MINOR APPLICATION OF WOUND VAC;  Surgeon: Theodoro Kos, DO;  Location: [place];  Service: Plastics;  Laterality: Left;  . Incision and drainage of wound Left 04/21/2014    Procedure: IRRIGATION AND DEBRIDEMENT OF LEFT UPPER LEG WOUND WITH PLACEMENT OF ACELL/VAC;  Surgeon: Theodoro Kos, DO;  Location: [place];  Service: Plastics;  Laterality: Left;   Prior to Admission medications   Medication Sig Start Date End Date Taking? Authorizing Provider  acetaminophen (TYLENOL) 325 MG tablet Take 650 mg by mouth every 6 (six) hours as needed for fever.   Yes Historical Provider, MD  allopurinol (ZYLOPRIM) 100 MG tablet Take 100 mg by mouth daily.    Yes Historical Provider, MD  atorvastatin (LIPITOR) 40 MG tablet Take 40 mg by mouth daily.    Yes Historical Provider, MD  chlorthalidone (HYGROTON) 25 MG tablet Take 12.5 mg by mouth daily.   Yes Historical Provider, MD  docusate sodium (COLACE) 100 MG capsule Take 100 mg by mouth 2 (two) times daily.   Yes Historical Provider, MD  enoxaparin (LOVENOX) 150 MG/ML injection Inject 0.93 mLs (140 mg total) into the skin every 12 (twelve) hours. 04/09/14  Yes  Marianne L [name], PA-C  furosemide (LASIX) 20 MG tablet Take 20 mg by mouth daily.   Yes Historical Provider, MD  insulin aspart (NOVOLOG) 100 UNIT/ML injection Inject 0-9 Units into the skin 3 (three) times daily before meals. 70-120=0 121-150=1 151-200=2 201-250=3 251-300=5 301-350=7 351-400=9   Yes Historical Provider, MD  Iron Polysacch Cmplx-B12-FA 150-0.025-1 MG CAPS Take 1 tablet by mouth every morning. 04/09/14  Yes Bobby Rumpf [name], PA-C  isosorbide mononitrate (ISMO,MONOKET) 10 MG tablet Take 10 mg by mouth 2 (two) times daily.   Yes Historical Provider, MD  magnesium oxide (MAG-OX) 400 MG tablet Take 400 mg by mouth daily.  Yes Historical Provider, MD  metoprolol tartrate (LOPRESSOR) 25 MG tablet Take 37.5 mg by mouth 2 (two) times daily.    Yes Historical Provider, MD  polyethylene glycol (MIRALAX / GLYCOLAX) packet Take 17 g by mouth daily. Mix with 4-6 oz liquid   Yes Historical Provider, MD  potassium chloride (K-DUR) 10 MEQ tablet Take 10 mEq by mouth daily.   Yes Historical Provider, MD  senna (SENOKOT) 8.6 MG TABS tablet Take 1 tablet (8.6 mg total) by mouth 2 (two) times daily. 04/09/14  Yes Bobby Rumpf [name], PA-C  ciprofloxacin (CIPRO) 500 MG tablet Take 1 tablet (500 mg total) by mouth 2 (two) times daily. 04/14/14   Claire Sanger, DO  esomeprazole (NEXIUM) 40 MG capsule Take 40 mg by mouth daily before breakfast. Take on an empty stomach    Historical Provider, MD   Allergies  Allergen Reactions  . Bactrim [Sulfamethoxazole-Tmp Ds] Other (See Comments)    Hyperkalemia (July 2015)  . Penicillins Hives    FAMILY HISTORY:  Family History  Problem Relation Age of Onset  . Emphysema Father   . Asthma Brother   . Heart disease Mother   . Stomach cancer Brother   . Heart disease Maternal Grandmother   . Diabetes Mother   . Diabetes Sister    SOCIAL HISTORY:  reports that she quit smoking about 32 years ago. Her smoking use included Cigarettes. She has a 33 pack-year  smoking history. She has never used smokeless tobacco. She reports that she drinks alcohol. She reports that she does not use illicit drugs.  REVIEW OF SYSTEMS:  10 pts reviewed and neg except HPI.  SUBJECTIVE:   VITAL SIGNS: Temp:  [101 F (38.3 C)-104.4 F (40.2 C)] 101.8 F (38.8 C) (09/13 2000) Pulse Rate:  [96-127] 101 (09/13 2028) Resp:  [15-28] 20 (09/13 2028) BP: (114-211)/(44-73) 140/44 mmHg (09/13 2000) SpO2:  [94 %-100 %] 100 % (09/13 2028) Weight:  [318 lb (144.244 kg)-347 lb 0.1 oz (157.4 kg)] 347 lb 0.1 oz (157.4 kg) (09/13 1600) HEMODYNAMICS:   VENTILATOR SETTINGS:   INTAKE / OUTPUT: Intake/Output     09/13 0701 - 09/14 0700   P.O. 200   I.V. (mL/kg) 300 (1.9)   Blood 525   IV Piggyback 125   Total Intake(mL/kg) 1150 (7.3)   Urine (mL/kg/hr) 1575   Total Output 1575   Net -425         PHYSICAL EXAMINATION: General:  Obese, NAD Neuro:  Nonfocal, PERRL HEENT:  Supple, unable to appreciate JVD 2/2 habitus Cardiovascular:  Slightly tachy but regular, no murmurs Lungs:  CTAB with good air movement, no wheezes or crackles Abdomen:  +BS, Soft Musculoskeletal:  MAEW Skin:  Large L inner thigh wound, bandage c/d/i  LABS:  CBC  Recent Labs Lab 05/09/14 1005  WBC 6.3  HGB 4.9*  HCT 15.3*  PLT 105*   Coag's  Recent Labs Lab 05/09/14 1700  INR 1.28   BMET  Recent Labs Lab 05/09/14 1005  NA 145  K 2.6*  CL 122*  CO2 12*  BUN 10  CREATININE 0.55  GLUCOSE 82   Electrolytes  Recent Labs Lab 05/09/14 1004 05/09/14 1005  CALCIUM  --  4.2*  MG 0.7*  --   PHOS 1.1*  --    Sepsis Markers  Recent Labs Lab 05/09/14 1004 05/09/14 1127  LATICACIDVEN  --  1.10  PROCALCITON 0.85  --    ABG No results found for this basename: PHART, PCO2ART, PO2ART,  in the last 168 hours Liver Enzymes  Recent Labs Lab 05/09/14 1005  AST 6  ALT 5  ALKPHOS 54  BILITOT <0.2*  ALBUMIN 1.1*   Cardiac Enzymes No results found for this basename:  TROPONINI, PROBNP,  in the last 168 hours Glucose No results found for this basename: GLUCAP,  in the last 168 hours  Imaging Dg Chest Port 1 View  05/09/2014   CLINICAL DATA:  Central line placement.  EXAM: PORTABLE CHEST - 1 VIEW 5:22 p.m.  COMPARISON:  05/09/2014 at 9:54 a.m.  FINDINGS: Right jugular vein catheter has been inserted and the tip is in the superior vena cava above the right atrium in good position. Heart size is within normal limits. Slight pulmonary vascular prominence. Peribronchial thickening as noted previously. No infiltrates or effusions.  IMPRESSION: Central line in good position. No pneumothorax. Persistent peribronchial thickening and slight pulmonary vascular prominence.   Electronically Signed   By: Rozetta Nunnery M.D.   On: 05/09/2014 18:04   Dg Chest Port 1 View  05/09/2014   CLINICAL DATA:  68 year old female with shortness of breath and fever.  EXAM: PORTABLE CHEST - 1 VIEW  COMPARISON:  03/03/2014  FINDINGS: Upper limits normal heart size again noted.  Diffuse peribronchial thickening is noted and increased.  No definite airspace disease, pleural effusion or pneumothorax noted.  No acute bony abnormalities are identified.  IMPRESSION: Increasing diffuse peribronchial thickening/bronchitis. No definite focal airspace opacity on this single view.   Electronically Signed   By: Hassan Rowan M.D.   On: 05/09/2014 11:34   Ct Extrem Lower W Cm Bil  05/09/2014   CLINICAL DATA:  Fever. Soft tissue wound in the left thigh with cellulitis.  EXAM: CT OF THE LOWER BILATERAL EXTREMITY WITH CONTRAST  TECHNIQUE: Multidetector CT imaging of the lower bilateral extremities was performed according to the standard protocol following intravenous contrast administration.  COMPARISON:  None.  CONTRAST:  175mL OMNIPAQUE IOHEXOL 300 MG/ML  SOLN  FINDINGS: There is extensive subcutaneous edema throughout the left lower extremity. No acute abnormality of the bones. There are slight degenerative changes  at both hips and there is moderately severe arthritis of the left knee. No osteomyelitis. No joint effusions.  There is no evidence of a soft tissue abscess. There is similar but less extensive subcutaneous edema in the medial aspect of the right thigh.  There is a focal retracted area in the skin surface of the medial aspect of the left thigh consistent with the wound.  Incidental note is made of multiple small dense nodular areas in the subcutaneous fat of the panniculus consistent with injection granulomas.  IMPRESSION: Extensive subcutaneous edema throughout the left lower extremity with a soft tissue wound in the medial aspect of the left thigh. There is no abscess, osteomyelitis, joint effusion, or myositis.   Electronically Signed   By: Rozetta Nunnery M.D.   On: 05/09/2014 13:43    EKG: Sinus tach with rate of about 100, no ST changes CXR: Increase vascular prominence, no focal infiltates  ASSESSMENT / PLAN:  PULMONARY A: No acute resp issues or complaints.  Stable on RA.  Suspicion for PNA low.  Pt does have h/o PE in late June.     P:   D/c Levaquin Cont Vanc/ aztreonam O2 prn Agree with treatment dose lovenox for PE  CARDIOVASCULAR A: Chronic diastolic CHF HTN  P:   Agree with continuing home meds Would aim for euvolemia for now and use additional PRN lasix  to achieve this  RENAL A: K of 2.6, Bicarb of 12 Unclear if these labs are real or spurious given that she overall appears well and would expect some degree of renal dysfunction if bicarb was truly 12  P:   Rechecking BMET Cont bicarb gtt for now but may d/c based on results of BMET  GASTROINTESTINAL A: No signs of GIB  P:   Modified carb diet Monitor for signs of possible GIB  HEMATOLOGIC A: Pt anemic with Hg of 4.9.  Now s/p 2 units PRBC's.  No clear sign of bleed.  Question whether this is spurious value as recent Hg was 9.0  P:   Rechecking CBC  INFECTIOUS A: Sepsis (elevated temp, HR) likely 2/2  leg wound  P:   Blood cx pending Vanc/ aztreonam for Abx Plastics c/s for assistance.    ENDOCRINE A: DM  P:   Agree with SSI  NEUROLOGIC A: No acute issues  P:   Monitor for changes  TODAY'S SUMMARY: 68yof with morbid obesity, diastolic CHF, HTN, DM and chronic left leg ulcer, DVT presents with cc of AMS, fevers, found to be septic likely 2/2 leg source.   Also with anemia.  Pt feeling better with Abx and blood.  Rechecking labs.  Cont bicarb gtt for now.    I have personally obtained a history, examined the patient, evaluated laboratory and imaging results, formulated the assessment and plan and placed orders.  CRITICAL CARE: The patient is critically ill with multiple organ systems failure and requires high complexity decision making for assessment and support, frequent evaluation and titration of therapies, application of advanced monitoring technologies and extensive interpretation of multiple databases. Critical Care Time devoted to patient care services described in this note is 35 minutes.   Lucrezia Starch, MD Pulmonary and [place] Pager: [phone]   05/09/2014, 9:28 PM

## 2014-05-09 NOTE — ED Notes (Signed)
Bed: KM63 Expected date: 05/09/14 Expected time: 9:30 AM Means of arrival: Ambulance Comments: Fever, wound on thigh

## 2014-05-09 NOTE — ED Notes (Signed)
Blood started at 1420. Verified by Cooper Render and Patty RN.

## 2014-05-09 NOTE — ED Notes (Signed)
BiIlateral edema noted to lower legs. Bruises to the right arm. NO rash noted. Diminished lungs sounds.

## 2014-05-09 NOTE — Progress Notes (Signed)
CRITICAL VALUE ALERT  Critical value received:  Mg 0.7  Date of notification: 05/09/2014  Time of notification:  16:31  Critical value read back: yes  Nurse who received alert:  Lacinda Axon RN  MD notified (1st page): Dr. Lake Bells   Time of first page:  25  MD notified (2nd page): N/A  Time of second page: N/A  Responding MD:  Dr. Lake Bells  Time MD responded:  1700

## 2014-05-09 NOTE — ED Notes (Signed)
Pt from [facility] c/o a fever of 102 this am.Wound vac from left inner thigh removed on Friday.  Tylenol 650mg  given by facility staff at 0530 this a.m.

## 2014-05-09 NOTE — Progress Notes (Signed)
Spoke with patient about wearing CPAP tonight. She states she does not wear one at home and doesn't want it here. Pt to notify respiratory if she changes her mind. RN aware.

## 2014-05-09 NOTE — ED Notes (Signed)
MD Allen at bedside.

## 2014-05-09 NOTE — ED Notes (Signed)
Pt in CT will start blood upon arrival.

## 2014-05-09 NOTE — ED Provider Notes (Signed)
CSN: [id number]     Arrival date & time 05/09/14  5374 History   First MD Initiated Contact with Patient 05/09/14 [phone]     Chief Complaint  Patient presents with  . Fever  . Code Sepsis     (Consider location/radiation/quality/duration/timing/severity/associated sxs/prior Treatment) HPI Comments: Patient here complaining of fever this morning up to 102. She's had a wound VAC removed from her left thigh removed 2 days ago. Patient's temperature treated with Tylenol 650 prior to arrival. She does note increasing cough and congestion. No urinary symptoms. Denies any severe abdominal or chest pain. No reported vomiting or diarrhea. Symptoms persisted nothing makes them better or worse.  Patient is a 68 y.o. female presenting with fever. The history is provided by the patient.  Fever   Past Medical History  Diagnosis Date  . Hypertension   . Hyperlipidemia   . CHF (congestive heart failure)   . GERD (gastroesophageal reflux disease)   . Depression   . Lymphedema of leg     left leg  . Pulmonary embolism 02/18/14    diagnosed at [facility] with CT angio chest  . Chronic indwelling Foley catheter   . Heart murmur     "just found out I have one" (04/07/2014)  . DVT (deep venous thrombosis) 01/2014    LLE; "went from my leg to my lungs"  . Pneumonia 1990's X 2  . Sleep apnea     doesn't use cpap machine or 02 at night (04/07/2014)  . Diabetes mellitus     "at one time" (04/07/2014)  . Anemia   . History of blood transfusion 1990's X 2    "when they did my surgeries"  . Migraines     "none for years now" (04/07/2014)  . Arthritis     "arms, legs" (04/07/2014)  . Gout   . Urinary incontinence   . Chronic kidney disease (CKD), stage III (moderate)     [name] 04/07/2014  . Neuropathy    Past Surgical History  Procedure Laterality Date  . Shoulder open rotator cuff repair Right ~ 2000  . Knee arthroscopy Right ?1980's  . Temporal artery biopsy / ligation Right ?1990's  . Colonoscopy with  propofol  09/23/2012    Procedure: COLONOSCOPY WITH PROPOFOL;  Surgeon: Jerene Bears, MD;  Location: [facility];  Service: Gastroenterology;  Laterality: N/A;  . Panniculectomy Left 02/08/2014    w/wound debridement @ medial thigh  . Irrigation and debridement abscess Left 02/24/2014    Procedure: MINOR INCISION AND DRAINAGE OF ABSCESS;  Surgeon: Theodoro Kos, DO;  Location: [facility];  Service: Plastics;  Laterality: Left;  . I&d extremity Left 03/03/2014    Procedure: IRRIGATION AND DEBRIDEMENT LEFT LEG WOUND AND PLACEMENT OF A CELL AND VAC;  Surgeon: Theodoro Kos, DO;  Location: [place];  Service: Plastics;  Laterality: Left;  . Incision and drainage of wound Left 03/16/2014    Procedure: IRRIGATION AND DEBRIDEMENT OF LEFT THIGH WOUND, APPLICATION ACELL;  Surgeon: Irene Limbo, MD;  Location: [place];  Service: Plastics;  Laterality: Left;  . Incision and drainage of wound Left 03/25/2014    Procedure: IRRIGATION AND DEBRIDEMENT OF LEFT LEG WOUND WITH PLACEMENT OF ACELL/VAC;  Surgeon: Theodoro Kos, DO;  Location: [place];  Service: Plastics;  Laterality: Left;  . Incision and drainage of wound Left 03/31/2014    Procedure: IRRIGATION AND DEBRIDEMENT LEFT LEG WOUND WITH PLACEMENT OF A-CELL/VAC;  Surgeon: Theodoro Kos, DO;  Location: [place];  Service: Plastics;  Laterality: Left;  . Cataract extraction w/ intraocular lens implant Right 2014  . Vena cava filter placement  02/2014  . Incision and drainage of wound Left 04/07/2014    Procedure: IRRIGATION AND DEBRIDEMENT LEFT LEG WOUND WITH PLACEMENT OF A CELL AND VAC;  Surgeon: Theodoro Kos, DO;  Location: [place];  Service: Plastics;  Laterality: Left;  . Incision and drainage of wound Left 04/14/2014    Procedure: IRRIGATION AND DEBRIDEMENT WOUND;  Surgeon: Theodoro Kos, DO;  Location: [place];  Service: Plastics;  Laterality: Left;  . Application of a-cell of extremity Left 04/14/2014    Procedure: APPLICATION OF A-CELL OF EXTREMITY;  Surgeon: Theodoro Kos,  DO;  Location: [place];  Service: Plastics;  Laterality: Left;  Marland Kitchen Minor application of wound vac Left 04/14/2014    Procedure: MINOR APPLICATION OF WOUND VAC;  Surgeon: Theodoro Kos, DO;  Location: [place];  Service: Plastics;  Laterality: Left;  . Incision and drainage of wound Left 04/21/2014    Procedure: IRRIGATION AND DEBRIDEMENT OF LEFT UPPER LEG WOUND WITH PLACEMENT OF ACELL/VAC;  Surgeon: Theodoro Kos, DO;  Location: [place];  Service: Plastics;  Laterality: Left;   Family History  Problem Relation Age of Onset  . Emphysema Father   . Asthma Brother   . Heart disease Mother   . Stomach cancer Brother   . Heart disease Maternal Grandmother   . Diabetes Mother   . Diabetes Sister    History  Substance Use Topics  . Smoking status: Former Smoker -- 1.00 packs/day for 33 years    Types: Cigarettes    Quit date: 08/27/1981  . Smokeless tobacco: Never Used  . Alcohol Use: Yes     Comment: 04/07/2014 "used to drink; stopped in ~ 1983"   OB History   Grav Para Term Preterm Abortions TAB SAB Ect Mult Living                 Review of Systems  Constitutional: Positive for fever.  All other systems reviewed and are negative.     Allergies  Bactrim and Penicillins  Home Medications   Prior to Admission medications   Medication Sig Start Date End Date Taking? Authorizing Provider  allopurinol (ZYLOPRIM) 100 MG tablet Take 100 mg by mouth daily.     Historical Provider, MD  aspirin EC 325 MG tablet Take 325 mg by mouth daily.    Historical Provider, MD  atorvastatin (LIPITOR) 40 MG tablet Take 40 mg by mouth daily.     Historical Provider, MD  chlorthalidone (HYGROTON) 25 MG tablet Take 12.5 mg by mouth daily.    Historical Provider, MD  ciprofloxacin (CIPRO) 500 MG tablet Take 1 tablet (500 mg total) by mouth 2 (two) times daily. 04/14/14   Claire Sanger, DO  enoxaparin (LOVENOX) 150 MG/ML injection Inject 0.93 mLs (140 mg total) into the skin every 12 (twelve) hours. 04/09/14    Bobby Rumpf [name], PA-C  esomeprazole (NEXIUM) 40 MG capsule Take 40 mg by mouth daily before breakfast. Take on an empty stomach    Historical Provider, MD  gabapentin (NEURONTIN) 300 MG capsule Take 2 capsules (600 mg total) by mouth 2 (two) times daily. 04/09/14   Bobby Rumpf [name], PA-C  hydrocerin (EUCERIN) CREA Apply 1 application topically 2 (two) times daily. 04/09/14   Bobby Rumpf [name], PA-C  insulin aspart (NOVOLOG) 100 UNIT/ML injection Inject 0-9 Units into the skin 3 (three) times daily with meals. 04/09/14   Melton Alar, PA-C  Iron Polysacch Cmplx-B12-FA 150-0.025-1 MG CAPS Take 1 tablet by mouth every morning. 04/09/14   Bobby Rumpf [name], PA-C  isosorbide mononitrate (ISMO,MONOKET) 10 MG tablet Take 10 mg by mouth 2 (two) times daily.    Historical Provider, MD  magnesium oxide (MAG-OX) 400 MG tablet Take 400 mg by mouth daily.     Historical Provider, MD  metoprolol tartrate (LOPRESSOR) 25 MG tablet Take 37.5 mg by mouth 2 (two) times daily.     Historical Provider, MD  Oxycodone HCl 10 MG TABS Take one tablet by mouth every 4 hours as needed for pain 04/29/14   Tiffany L Reed, DO  polyethylene glycol (MIRALAX / GLYCOLAX) packet Take 17 g by mouth daily. Mix with 4-6 oz liquid    Historical Provider, MD  promethazine (PHENERGAN) 25 MG tablet Take 25 mg by mouth 2 (two) times daily as needed for nausea or vomiting.    Historical Provider, MD  senna (SENOKOT) 8.6 MG TABS tablet Take 1 tablet (8.6 mg total) by mouth 2 (two) times daily. 04/09/14   Bobby Rumpf [name], PA-C  zolpidem (AMBIEN CR) 6.25 MG CR tablet Take 1 tablet (6.25 mg total) by mouth at bedtime as needed for sleep. 04/12/14   Tiffany L Reed, DO   BP 158/62  Pulse 125  Temp(Src) 104.4 F (40.2 C) (Rectal)  Resp 28  Ht 5\' 6"  (1.676 m)  SpO2 94% Physical Exam  Nursing note and vitals reviewed. Constitutional: She is oriented to person, place, and time. She appears well-developed and well-nourished. She appears lethargic.   Non-toxic appearance. No distress.  HENT:  Head: Normocephalic and atraumatic.  Eyes: Conjunctivae, EOM and lids are normal. Pupils are equal, round, and reactive to light.  Neck: Normal range of motion. Neck supple. No tracheal deviation present. No mass present.  Cardiovascular: Regular rhythm and normal heart sounds.  Tachycardia present.  Exam reveals no gallop.   No murmur heard. Pulmonary/Chest: Effort normal. No stridor. No respiratory distress. She has decreased breath sounds. She has no wheezes. She has no rhonchi. She has no rales.  Abdominal: Soft. Normal appearance and bowel sounds are normal. She exhibits no distension. There is no tenderness. There is no rebound and no CVA tenderness.  Musculoskeletal: Normal range of motion. She exhibits no edema and no tenderness.       Legs: Neurological: She is oriented to person, place, and time. She appears lethargic. No cranial nerve deficit. GCS eye subscore is 4. GCS verbal subscore is 5. GCS motor subscore is 6.  Skin: Skin is warm and dry. No abrasion and no rash noted.  Psychiatric: Her affect is blunt. Her speech is delayed.    ED Course  Procedures (including critical care time) Labs Review Labs Reviewed  URINE CULTURE  CULTURE, BLOOD (ROUTINE X 2)  CULTURE, BLOOD (ROUTINE X 2)  CBC WITH DIFFERENTIAL  COMPREHENSIVE METABOLIC PANEL  URINALYSIS, ROUTINE W REFLEX MICROSCOPIC  I-STAT CG4 LACTIC ACID, ED    Imaging Review No results found.   EKG Interpretation   Date/Time:  Sunday May 09 2014 09:40:25 EDT Ventricular Rate:  123 PR Interval:  139 QRS Duration: 131 QT Interval:  321 QTC Calculation: 459 R Axis:   -148 Text Interpretation:  Sinus tachycardia Right bundle branch block  Confirmed by Dawn Convery  MD, Jarmar Rousseau ([id number]) on 05/09/2014 10:11:27 AM      MDM   Final diagnoses:  None     Pt place on antibiotics for treatment of suspected infection of her  leg. Will be admitted to medicine  service   Leota Jacobsen, MD 05/14/14 [phone]

## 2014-05-09 NOTE — ED Notes (Signed)
NO suspected reaction noted. Pt tolerating transfusion well. Lung sounds diminished as before. Urine out put clear as before transfusion. Patient reports back pain as before transfusion.

## 2014-05-09 NOTE — ED Notes (Signed)
PT has c/o low back pain and bilateral leg pain which has been relieved by the Dilaudid.

## 2014-05-09 NOTE — Progress Notes (Signed)
ANTIBIOTIC CONSULT NOTE - INITIAL  Pharmacy Consult for Levaquin/Azactam/Vancomycin Indication: Sepsis  Allergies  Allergen Reactions  . Bactrim [Sulfamethoxazole-Tmp Ds] Other (See Comments)    Hyperkalemia (July 2015)  . Penicillins Hives    Patient Measurements: Height: 5\' 6"  (167.6 cm) Weight: 318 lb (144.244 kg) IBW/kg (Calculated) : 59.3   Vital Signs: Temp: 102.8 F (39.3 C) (09/13 1120) Temp src: Core (Comment) (09/13 1107) BP: 167/59 mmHg (09/13 1100) Pulse Rate: 125 (09/13 1120) Intake/Output from previous day:   Intake/Output from this shift:    Labs:  Recent Labs  05/09/14 1005  WBC 6.3  HGB 4.9*  PLT 105*  CREATININE 0.55   Estimated Creatinine Clearance: 100.5 ml/min (by C-G formula based on Cr of 0.55). No results found for this basename: VANCOTROUGH, VANCOPEAK, VANCORANDOM, GENTTROUGH, GENTPEAK, GENTRANDOM, TOBRATROUGH, TOBRAPEAK, TOBRARND, AMIKACINPEAK, AMIKACINTROU, AMIKACIN,  in the last 72 hours   Microbiology: No results found for this or any previous visit (from the past 720 hour(s)).  Medical History: Past Medical History  Diagnosis Date  . Hypertension   . Hyperlipidemia   . CHF (congestive heart failure)   . GERD (gastroesophageal reflux disease)   . Depression   . Lymphedema of leg     left leg  . Pulmonary embolism 02/18/14    diagnosed at [facility] with CT angio chest  . Chronic indwelling Foley catheter   . Heart murmur     "just found out I have one" (04/07/2014)  . DVT (deep venous thrombosis) 01/2014    LLE; "went from my leg to my lungs"  . Pneumonia 1990's X 2  . Sleep apnea     doesn't use cpap machine or 02 at night (04/07/2014)  . Diabetes mellitus     "at one time" (04/07/2014)  . Anemia   . History of blood transfusion 1990's X 2    "when they did my surgeries"  . Migraines     "none for years now" (04/07/2014)  . Arthritis     "arms, legs" (04/07/2014)  . Gout   . Urinary incontinence   . Chronic kidney disease  (CKD), stage III (moderate)     [name] 04/07/2014  . Neuropathy     Medications:  Scheduled:   Infusions:  . sodium chloride 125 mL/hr at 05/09/14 1052  . sodium chloride    . sodium chloride     Followed by  . sodium chloride    . aztreonam    . levofloxacin (LEVAQUIN) IV    . potassium chloride    . vancomycin     Assessment: 17 yoF c/o fever s/p wound vac removal on 9/11 now with fever Tmax=104.4. Vancomycin/Azactam/Levaquin for Sepsis.   Goal of Therapy:  Vancomycin trough level 15-20 mcg/ml  Plan:   Azactam 2gm IV q8h  Levaquin 750mg  IV q24h  Vancomycin 2500mg  x1 then 1500mg  IV q12h  F/U SCr/levels/cultures as needed  Lawana Pai R 05/09/2014,11:35 AM

## 2014-05-10 ENCOUNTER — Encounter (HOSPITAL_BASED_OUTPATIENT_CLINIC_OR_DEPARTMENT_OTHER): Payer: Medicare Other | Attending: Plastic Surgery

## 2014-05-10 ENCOUNTER — Encounter (HOSPITAL_COMMUNITY): Payer: Self-pay | Admitting: Plastic Surgery

## 2014-05-10 ENCOUNTER — Encounter (HOSPITAL_BASED_OUTPATIENT_CLINIC_OR_DEPARTMENT_OTHER): Payer: Medicare Other

## 2014-05-10 DIAGNOSIS — I82409 Acute embolism and thrombosis of unspecified deep veins of unspecified lower extremity: Secondary | ICD-10-CM | POA: Diagnosis present

## 2014-05-10 DIAGNOSIS — B955 Unspecified streptococcus as the cause of diseases classified elsewhere: Secondary | ICD-10-CM | POA: Diagnosis present

## 2014-05-10 DIAGNOSIS — I2699 Other pulmonary embolism without acute cor pulmonale: Secondary | ICD-10-CM

## 2014-05-10 DIAGNOSIS — A491 Streptococcal infection, unspecified site: Secondary | ICD-10-CM

## 2014-05-10 DIAGNOSIS — R7881 Bacteremia: Secondary | ICD-10-CM

## 2014-05-10 LAB — TYPE AND SCREEN
ABO/RH(D): O POS
Antibody Screen: NEGATIVE
UNIT DIVISION: 0
UNIT DIVISION: 0

## 2014-05-10 LAB — COMPREHENSIVE METABOLIC PANEL
ALBUMIN: 1.9 g/dL — AB (ref 3.5–5.2)
ALT: 7 U/L (ref 0–35)
ANION GAP: 11 (ref 5–15)
AST: 9 U/L (ref 0–37)
Alkaline Phosphatase: 88 U/L (ref 39–117)
BUN: 18 mg/dL (ref 6–23)
CALCIUM: 8.1 mg/dL — AB (ref 8.4–10.5)
CHLORIDE: 106 meq/L (ref 96–112)
CO2: 22 mEq/L (ref 19–32)
CREATININE: 1.25 mg/dL — AB (ref 0.50–1.10)
GFR calc Af Amer: 50 mL/min — ABNORMAL LOW (ref >90)
GFR calc non Af Amer: 43 mL/min — ABNORMAL LOW (ref >90)
Glucose, Bld: 168 mg/dL — ABNORMAL HIGH (ref 70–99)
Potassium: 5.6 mEq/L — ABNORMAL HIGH (ref 3.7–5.3)
Sodium: 139 mEq/L (ref 137–147)
Total Bilirubin: 0.4 mg/dL (ref 0.3–1.2)
Total Protein: 6.4 g/dL (ref 6.0–8.3)

## 2014-05-10 LAB — MAGNESIUM: MAGNESIUM: 2.2 mg/dL (ref 1.5–2.5)

## 2014-05-10 LAB — BASIC METABOLIC PANEL
ANION GAP: 9 (ref 5–15)
BUN: 19 mg/dL (ref 6–23)
CO2: 23 mEq/L (ref 19–32)
CREATININE: 1.23 mg/dL — AB (ref 0.50–1.10)
Calcium: 8.4 mg/dL (ref 8.4–10.5)
Chloride: 102 mEq/L (ref 96–112)
GFR, EST AFRICAN AMERICAN: 51 mL/min — AB (ref >90)
GFR, EST NON AFRICAN AMERICAN: 44 mL/min — AB (ref >90)
Glucose, Bld: 184 mg/dL — ABNORMAL HIGH (ref 70–99)
Potassium: 5.2 mEq/L (ref 3.7–5.3)
Sodium: 134 mEq/L — ABNORMAL LOW (ref 137–147)

## 2014-05-10 LAB — CBC
HEMATOCRIT: 29.3 % — AB (ref 36.0–46.0)
HEMOGLOBIN: 9.7 g/dL — AB (ref 12.0–15.0)
MCH: 29.3 pg (ref 26.0–34.0)
MCHC: 33.1 g/dL (ref 30.0–36.0)
MCV: 88.5 fL (ref 78.0–100.0)
Platelets: 200 10*3/uL (ref 150–400)
RBC: 3.31 MIL/uL — ABNORMAL LOW (ref 3.87–5.11)
RDW: 15.2 % (ref 11.5–15.5)
WBC: 12.2 10*3/uL — AB (ref 4.0–10.5)

## 2014-05-10 LAB — GLUCOSE, CAPILLARY
GLUCOSE-CAPILLARY: 175 mg/dL — AB (ref 70–99)
GLUCOSE-CAPILLARY: 163 mg/dL — AB (ref 70–99)
GLUCOSE-CAPILLARY: 152 mg/dL — AB (ref 70–99)
Glucose-Capillary: 201 mg/dL — ABNORMAL HIGH (ref 70–99)
Glucose-Capillary: 171 mg/dL — ABNORMAL HIGH (ref 70–99)
Glucose-Capillary: 179 mg/dL — ABNORMAL HIGH (ref 70–99)

## 2014-05-10 LAB — FOLATE

## 2014-05-10 LAB — VITAMIN B12: Vitamin B-12: 805 pg/mL (ref 211–911)

## 2014-05-10 LAB — VITAMIN D 25 HYDROXY (VIT D DEFICIENCY, FRACTURES): VIT D 25 HYDROXY: 19 ng/mL — AB (ref 30–89)

## 2014-05-10 LAB — PROTIME-INR
INR: 1.58 — AB (ref 0.00–1.49)
Prothrombin Time: 18.9 seconds — ABNORMAL HIGH (ref 11.6–15.2)

## 2014-05-10 LAB — PTH, INTACT AND CALCIUM
Calcium, Total (PTH): 8.1 mg/dL — ABNORMAL LOW (ref 8.4–10.5)
PTH: 161 pg/mL — ABNORMAL HIGH (ref 14–64)

## 2014-05-10 LAB — PROCALCITONIN: PROCALCITONIN: 4.75 ng/mL

## 2014-05-10 LAB — CALCIUM, IONIZED: CALCIUM ION: 1.18 mmol/L (ref 1.13–1.30)

## 2014-05-10 LAB — FERRITIN: Ferritin: 653 ng/mL — ABNORMAL HIGH (ref 10–291)

## 2014-05-10 LAB — HEMOGLOBIN A1C
Hgb A1c MFr Bld: 6.5 % — ABNORMAL HIGH (ref <5.7)
MEAN PLASMA GLUCOSE: 140 mg/dL — AB (ref <117)

## 2014-05-10 LAB — LACTIC ACID, PLASMA: Lactic Acid, Venous: 1.6 mmol/L (ref 0.5–2.2)

## 2014-05-10 MED ORDER — VANCOMYCIN HCL 10 G IV SOLR
2000.0000 mg | INTRAVENOUS | Status: DC
  Administered 2014-05-10 – 2014-05-11 (×2): 2000 mg via INTRAVENOUS
  Filled 2014-05-10 (×3): qty 2000

## 2014-05-10 MED ORDER — LABETALOL HCL 5 MG/ML IV SOLN
10.0000 mg | INTRAVENOUS | Status: AC | PRN
Start: 2014-05-10 — End: 2014-05-13
  Administered 2014-05-10 – 2014-05-13 (×10): 10 mg via INTRAVENOUS
  Filled 2014-05-10 (×10): qty 4

## 2014-05-10 MED ORDER — SODIUM CHLORIDE 0.9 % IV SOLN: INTRAVENOUS | Status: DC

## 2014-05-10 NOTE — Consult Note (Signed)
Reason for Consult:Left leg pain Referring Physician: IM  Kelly [name] is an 68 y.o. female.  HPI: The patient is a 68 yrs old bf here for concerns about sepsis/bacteremia.  She was treated with a left thigh panniculectomy several months ago at [facility].  In the post operative period she developed a DVT, was anticoagulated and bleed into the operative site.  She was admitted to the [facility] system at that time.  She was taken to the OR several times for debridement and placement of Acell.  She has done extremely well healing the wound which started at ~8 cm deep.  It is now 1 cm deep and looks healthy with good granulation tissue.  She has severe pain on her left calf posterior and anteriorly.  It is slightly red, warm to touch and swollen.  This is distal from the operative site at the thigh.  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  She has not been on anticoagulation for the past week according to the records.  Past Medical History  Diagnosis Date  . Hypertension   . Hyperlipidemia   . CHF (congestive heart failure)   . GERD (gastroesophageal reflux disease)   . Depression   . Lymphedema of leg     left leg  . Pulmonary embolism 02/18/14    diagnosed at [facility] with CT angio chest  . Chronic indwelling Foley catheter   . Heart murmur     "just found out I have one" (04/07/2014)  . DVT (deep venous thrombosis) 01/2014    LLE; "went from my leg to my lungs"  . Pneumonia 1990's X 2  . Sleep apnea     doesn't use cpap machine or 02 at night (04/07/2014)  . Anemia   . History of blood transfusion 1990's X 2    "when they did my surgeries"  . Migraines     "none for years now" (04/07/2014)  . Arthritis     "arms, legs" (04/07/2014)  . Gout   . Urinary incontinence   . Chronic kidney disease (CKD), stage III (moderate)     [name] 04/07/2014  . Neuropathy   . Diabetes mellitus     "at one time" (04/07/2014)    Past Surgical History  Procedure  Laterality Date  . Shoulder open rotator cuff repair Right ~ 2000  . Knee arthroscopy Right ?1980's  . Temporal artery biopsy / ligation Right ?1990's  . Colonoscopy with propofol  09/23/2012    Procedure: COLONOSCOPY WITH PROPOFOL;  Surgeon: Jerene Bears, MD;  Location: [facility];  Service: Gastroenterology;  Laterality: N/A;  . Panniculectomy Left 02/08/2014    w/wound debridement @ medial thigh  . Irrigation and debridement abscess Left 02/24/2014    Procedure: MINOR INCISION AND DRAINAGE OF ABSCESS;  Surgeon: Theodoro Kos, DO;  Location: [facility];  Service: Plastics;  Laterality: Left;  . I&d extremity Left 03/03/2014    Procedure: IRRIGATION AND DEBRIDEMENT LEFT LEG WOUND AND PLACEMENT OF A CELL AND VAC;  Surgeon: Theodoro Kos, DO;  Location: [place];  Service: Plastics;  Laterality: Left;  . Incision and drainage of wound Left 03/16/2014    Procedure: IRRIGATION AND DEBRIDEMENT OF LEFT THIGH WOUND, APPLICATION ACELL;  Surgeon: Irene Limbo, MD;  Location: [place];  Service: Plastics;  Laterality: Left;  . Incision and drainage of wound Left 03/25/2014    Procedure: IRRIGATION AND DEBRIDEMENT OF LEFT LEG WOUND WITH PLACEMENT OF ACELL/VAC;  Surgeon:  Linden Mikes Sanger, DO;  Location: [place];  Service: Clinical cytogeneticist;  Laterality: Left;  . Incision and drainage of wound Left 03/31/2014    Procedure: IRRIGATION AND DEBRIDEMENT LEFT LEG WOUND WITH PLACEMENT OF A-CELL/VAC;  Surgeon: Theodoro Kos, DO;  Location: [facility];  Service: Plastics;  Laterality: Left;  . Cataract extraction w/ intraocular lens implant Right 2014  . Vena cava filter placement  02/2014  . Incision and drainage of wound Left 04/07/2014    Procedure: IRRIGATION AND DEBRIDEMENT LEFT LEG WOUND WITH PLACEMENT OF A CELL AND VAC;  Surgeon: Theodoro Kos, DO;  Location: [place];  Service: Plastics;  Laterality: Left;  . Incision and drainage of wound Left 04/14/2014    Procedure: IRRIGATION AND DEBRIDEMENT WOUND;  Surgeon: Theodoro Kos, DO;  Location:  [place];  Service: Plastics;  Laterality: Left;  . Application of a-cell of extremity Left 04/14/2014    Procedure: APPLICATION OF A-CELL OF EXTREMITY;  Surgeon: Theodoro Kos, DO;  Location: [place];  Service: Plastics;  Laterality: Left;  Marland Kitchen Minor application of wound vac Left 04/14/2014    Procedure: MINOR APPLICATION OF WOUND VAC;  Surgeon: Theodoro Kos, DO;  Location: [place];  Service: Plastics;  Laterality: Left;  . Incision and drainage of wound Left 04/21/2014    Procedure: IRRIGATION AND DEBRIDEMENT OF LEFT UPPER LEG WOUND WITH PLACEMENT OF ACELL/VAC;  Surgeon: Theodoro Kos, DO;  Location: [place];  Service: Plastics;  Laterality: Left;    Family History  Problem Relation Age of Onset  . Emphysema Father   . Asthma Brother   . Heart disease Mother   . Stomach cancer Brother   . Heart disease Maternal Grandmother   . Diabetes Mother   . Diabetes Sister     Social History:  reports that she quit smoking about 32 years ago. Her smoking use included Cigarettes. She has a 33 pack-year smoking history. She has never used smokeless tobacco. She reports that she drinks alcohol. She reports that she does not use illicit drugs.  Allergies:  Allergies  Allergen Reactions  . Bactrim [Sulfamethoxazole-Tmp Ds] Other (See Comments)    Hyperkalemia (July 2015)  . Penicillins Hives    Medications: I have reviewed the patient's current medications.  Results for orders placed during the hospital encounter of 05/09/14 (from the past 48 hour(s))  PROCALCITONIN     Status: None   Collection Time    05/09/14 10:04 AM      Result Value Ref Range   Procalcitonin 0.85     Comment:            Interpretation:     PCT > 0.5 ng/mL and <= 2 ng/mL:     Systemic infection (sepsis) is possible,     but other conditions are known to elevate     PCT as well.     (NOTE)             ICU PCT Algorithm               Non ICU PCT Algorithm        ----------------------------     ------------------------------              PCT < 0.25 ng/mL                 PCT < 0.1 ng/mL         Stopping of antibiotics            Stopping of antibiotics  strongly encouraged.               strongly encouraged.        ----------------------------     ------------------------------           PCT level decrease by               PCT < 0.25 ng/mL           >= 80% from peak PCT           OR PCT 0.25 - 0.5 ng/mL          Stopping of antibiotics                                                 encouraged.         Stopping of antibiotics               encouraged.        ----------------------------     ------------------------------           PCT level decrease by              PCT >= 0.25 ng/mL           < 80% from peak PCT            AND PCT >= 0.5 ng/mL            Continuing antibiotics                                                  encouraged.           Continuing antibiotics                encouraged.        ----------------------------     ------------------------------         PCT level increase compared          PCT > 0.5 ng/mL             with peak PCT AND              PCT >= 0.5 ng/mL             Escalation of antibiotics                                              strongly encouraged.          Escalation of antibiotics            strongly encouraged.  MAGNESIUM     Status: Abnormal   Collection Time    05/09/14 10:04 AM      Result Value Ref Range   Magnesium 0.7 (*) 1.5 - 2.5 mg/dL   Comment: CRITICAL RESULT CALLED TO, READ BACK BY AND VERIFIED WITH:     A.ARNOLD RN AT 5462 ON 13SEP15 BY C.BONGEL  PHOSPHORUS     Status: Abnormal   Collection Time    05/09/14 10:04 AM      Result Value Ref Range   Phosphorus 1.1 (*) 2.3 -  4.6 mg/dL  CBC WITH DIFFERENTIAL     Status: Abnormal   Collection Time    05/09/14 10:05 AM      Result Value Ref Range   WBC 6.3  4.0 - 10.5 K/uL   RBC 1.66 (*) 3.87 - 5.11 MIL/uL   Hemoglobin 4.9 (*) 12.0 - 15.0 g/dL   Comment: REPEATED TO VERIFY     CRITICAL RESULT  CALLED TO, READ BACK BY AND VERIFIED WITH:     ASIA KHALID,RN [id number] @ 1049 BY J SCOTTON   HCT 15.3 (*) 36.0 - 46.0 %   MCV 92.2  78.0 - 100.0 fL   MCH 29.5  26.0 - 34.0 pg   MCHC 32.0  30.0 - 36.0 g/dL   RDW 15.1  11.5 - 15.5 %   Platelets 105 (*) 150 - 400 K/uL   Comment: REPEATED TO VERIFY     SPECIMEN CHECKED FOR CLOTS     PLATELET COUNT CONFIRMED BY SMEAR   Neutrophils Relative % 86 (*) 43 - 77 %   Lymphocytes Relative 9 (*) 12 - 46 %   Monocytes Relative 5  3 - 12 %   Eosinophils Relative 0  0 - 5 %   Basophils Relative 0  0 - 1 %   Neutro Abs 5.4  1.7 - 7.7 K/uL   Lymphs Abs 0.6 (*) 0.7 - 4.0 K/uL   Monocytes Absolute 0.3  0.1 - 1.0 K/uL   Eosinophils Absolute 0.0  0.0 - 0.7 K/uL   Basophils Absolute 0.0  0.0 - 0.1 K/uL   Smear Review PLATELET COUNT CONFIRMED BY SMEAR    COMPREHENSIVE METABOLIC PANEL     Status: Abnormal   Collection Time    05/09/14 10:05 AM      Result Value Ref Range   Sodium 145  137 - 147 mEq/L   Potassium 2.6 (*) 3.7 - 5.3 mEq/L   Comment: CRITICAL RESULT CALLED TO, READ BACK BY AND VERIFIED WITH:     B.DUNCAN RN AT 1100 ON 13SEP15 BY C.BONGEL   Chloride 122 (*) 96 - 112 mEq/L   CO2 12 (*) 19 - 32 mEq/L   Glucose, Bld 82  70 - 99 mg/dL   BUN 10  6 - 23 mg/dL   Creatinine, Ser 0.55  0.50 - 1.10 mg/dL   Calcium 4.2 (*) 8.4 - 10.5 mg/dL   Comment: CRITICAL RESULT CALLED TO, READ BACK BY AND VERIFIED WITH:     B.DUNCAN RN AT 1100 ON 13SEP15 BY C.BONGEL   Total Protein 3.7 (*) 6.0 - 8.3 g/dL   Albumin 1.1 (*) 3.5 - 5.2 g/dL   AST 6  0 - 37 U/L   ALT 5  0 - 35 U/L   Alkaline Phosphatase 54  39 - 117 U/L   Total Bilirubin <0.2 (*) 0.3 - 1.2 mg/dL   GFR calc non Af Amer >90  >90 mL/min   GFR calc Af Amer >90  >90 mL/min   Comment: (NOTE)     The eGFR has been calculated using the CKD EPI equation.     This calculation has not been validated in all clinical situations.     eGFR's persistently <90 mL/min signify possible Chronic Kidney      Disease.   Anion gap 11  5 - 15  CULTURE, BLOOD (ROUTINE X 2)     Status: None   Collection Time    05/09/14 10:06 AM      Result Value Ref  Range   Specimen Description BLOOD LEFT FOREARM     Special Requests BOTTLES DRAWN AEROBIC AND ANAEROBIC 3CC     Culture  Setup Time       Value: 05/09/2014 15:16     Performed at [facility]   Culture       Value: [place] IN CHAINS     Note: Gram Stain Report Called to,Read Back By and Verified With: Gladys Damme RN on 05/10/14 at 04:03 by Rise Mu     Performed at [facility]   Report Status PENDING    CULTURE, BLOOD (ROUTINE X 2)     Status: None   Collection Time    05/09/14 10:16 AM      Result Value Ref Range   Specimen Description BLOOD RIGHT ANTECUBITAL     Special Requests BOTTLES DRAWN AEROBIC AND ANAEROBIC 3CC     Culture  Setup Time       Value: 05/09/2014 15:16     Performed at [facility]   Culture       Value: [place] IN CHAINS     Note: Gram Stain Report Called to,Read Back By and Verified With: ANGIE WELCH@1 :51PM ON 05/10/14 BY COOKV     Performed at [facility]   Report Status PENDING    URINALYSIS, ROUTINE W REFLEX MICROSCOPIC     Status: Abnormal   Collection Time    05/09/14 10:29 AM      Result Value Ref Range   Color, Urine YELLOW  YELLOW   APPearance CLEAR  CLEAR   Specific Gravity, Urine 1.015  1.005 - 1.030   pH 7.5  5.0 - 8.0   Glucose, UA NEGATIVE  NEGATIVE mg/dL   Hgb urine dipstick NEGATIVE  NEGATIVE   Bilirubin Urine NEGATIVE  NEGATIVE   Ketones, ur NEGATIVE  NEGATIVE mg/dL   Protein, ur 100 (*) NEGATIVE mg/dL   Urobilinogen, UA 0.2  0.0 - 1.0 mg/dL   Nitrite NEGATIVE  NEGATIVE   Leukocytes, UA NEGATIVE  NEGATIVE  URINE MICROSCOPIC-ADD ON     Status: None   Collection Time    05/09/14 10:29 AM      Result Value Ref Range   Squamous Epithelial / LPF RARE  RARE   WBC, UA 0-2  <3 WBC/hpf  TYPE AND SCREEN     Status: None   Collection Time     05/09/14 10:30 AM      Result Value Ref Range   ABO/RH(D) O POS     Antibody Screen NEG     Sample Expiration 05/12/2014     Unit Number [id number]     Blood Component Type RED CELLS,LR     Unit division 00     Status of Unit ISSUED,FINAL     Transfusion Status OK TO TRANSFUSE     Crossmatch Result Compatible     Unit Number [id number]     Blood Component Type RED CELLS,LR     Unit division 00     Status of Unit ISSUED,FINAL     Transfusion Status OK TO TRANSFUSE     Crossmatch Result Compatible    PREPARE RBC (CROSSMATCH)     Status: None   Collection Time    05/09/14 10:57 AM      Result Value Ref Range   Order Confirmation ORDER PROCESSED BY BLOOD BANK    I-STAT CG4 LACTIC ACID, ED     Status: None   Collection  Time    05/09/14 11:27 AM      Result Value Ref Range   Lactic Acid, Venous 1.10  0.5 - 2.2 mmol/L  MRSA PCR SCREENING     Status: Abnormal   Collection Time    05/09/14  3:52 PM      Result Value Ref Range   MRSA by PCR POSITIVE (*) NEGATIVE   Comment:            The GeneXpert MRSA Assay (FDA     approved for NASAL specimens     only), is one component of a     comprehensive MRSA colonization     surveillance program. It is not     intended to diagnose MRSA     infection nor to guide or     monitor treatment for     MRSA infections.     RESULT CALLED TO, READ BACK BY AND VERIFIED WITH:     S.Feltz AT 1725 ON 13SEP15 BY C.BONGEL  PROTIME-INR     Status: Abnormal   Collection Time    05/09/14  5:00 PM      Result Value Ref Range   Prothrombin Time 16.0 (*) 11.6 - 15.2 seconds   INR 1.28  0.00 - 1.49  VITAMIN B12     Status: None   Collection Time    05/09/14  5:50 PM      Result Value Ref Range   Vitamin B-12 805  211 - 911 pg/mL   Comment: Performed at [place]     Status: None   Collection Time    05/09/14  5:50 PM      Result Value Ref Range   Folate >20.0     Comment: (NOTE)     Reference Ranges             Deficient:       0.4 - 3.3 ng/mL            Indeterminate:   3.4 - 5.4 ng/mL            Normal:              > 5.4 ng/mL     Performed at [facility]     Status: Abnormal   Collection Time    05/09/14  5:50 PM      Result Value Ref Range   Iron 18 (*) 42 - 135 ug/dL   TIBC 229 (*) 250 - 470 ug/dL   Saturation Ratios 8 (*) 20 - 55 %   UIBC 211  125 - 400 ug/dL   Comment: Performed at [place]     Status: Abnormal   Collection Time    05/09/14  5:50 PM      Result Value Ref Range   Ferritin 653 (*) 10 - 291 ng/mL   Comment: Performed at [facility]  PTH, INTACT AND CALCIUM     Status: Abnormal   Collection Time    05/09/14  5:51 PM      Result Value Ref Range   PTH 161 (*) 14 - 64 pg/mL   Comment: ** Please note change in reference range(s). **   Calcium, Total (PTH) 8.1 (*) 8.4 - 10.5 mg/dL   Comment: (NOTE)     Interpretive Guide:  Intact PTH               Calcium                                 ----------               -------     Normal Parathyroid           Normal                   Normal     Hypoparathyroidism           Low or Low Normal        Low     Hyperparathyroidism         Primary                 Normal or High           High         Secondary               High                     Normal or Low         Tertiary                High                     High     Non-Parathyroid      Hypercalcemia              Low or Low Normal        High     **Please note change in methodology. If re-baselining is needed, for     patients who are being serially tested, please call customer service,     within 3 days of collection, to request to add on the appropriate     re-baselining test code for the previous methodology, at no charge.**     Performed at [place], IONIZED     Status: None   Collection Time    05/09/14  5:51 PM      Result Value Ref Range   Calcium, Ion  1.18  1.13 - 1.30 mmol/L   Comment: Performed at [facility] D 25 HYDROXY     Status: Abnormal   Collection Time    05/09/14  5:51 PM      Result Value Ref Range   Vit D, 25-Hydroxy 19 (*) 30 - 89 ng/mL   Comment: (NOTE)     This assay accurately quantifies Vitamin D, which is the sum of the     25-Hydroxy forms of Vitamin D2 and D3.  Studies have shown that the     optimum concentration of 25-Hydroxy Vitamin D is 30 ng/mL or higher.      Concentrations of Vitamin D between 20 and 29 ng/mL are considered to     be insufficient and concentrations less than 20 ng/mL are considered     to be deficient for Vitamin D.     Performed at [place], BLOOD     Status: Abnormal   Collection Time    05/09/14 10:00 PM      Result Value Ref Range   Hemoglobin 10.7 (*) 12.0 - 15.0 g/dL  Comment: DELTA CHECK NOTED     POST TRANSFUSION SPECIMEN   HCT 32.2 (*) 36.0 - 88.1 %  BASIC METABOLIC PANEL     Status: Abnormal   Collection Time    05/09/14 10:00 PM      Result Value Ref Range   Sodium 136 (*) 137 - 147 mEq/L   Comment: DELTA CHECK NOTED     REPEATED TO VERIFY   Potassium 5.0  3.7 - 5.3 mEq/L   Comment: DELTA CHECK NOTED     REPEATED TO VERIFY   Chloride 103  96 - 112 mEq/L   Comment: DELTA CHECK NOTED     REPEATED TO VERIFY   CO2 21  19 - 32 mEq/L   Glucose, Bld 184 (*) 70 - 99 mg/dL   BUN 17  6 - 23 mg/dL   Creatinine, Ser 1.13 (*) 0.50 - 1.10 mg/dL   Comment: DELTA CHECK NOTED     REPEATED TO VERIFY   Calcium 8.3 (*) 8.4 - 10.5 mg/dL   GFR calc non Af Amer 49 (*) >90 mL/min   GFR calc Af Amer 57 (*) >90 mL/min   Comment: (NOTE)     The eGFR has been calculated using the CKD EPI equation.     This calculation has not been validated in all clinical situations.     eGFR's persistently <90 mL/min signify possible Chronic Kidney     Disease.   Anion gap 12  5 - 15  PROCALCITONIN     Status: None   Collection Time     05/10/14  4:50 AM      Result Value Ref Range   Procalcitonin 4.75     Comment:            Interpretation:     PCT > 2 ng/mL:     Systemic infection (sepsis) is likely,     unless other causes are known.     (NOTE)             ICU PCT Algorithm               Non ICU PCT Algorithm        ----------------------------     ------------------------------             PCT < 0.25 ng/mL                 PCT < 0.1 ng/mL         Stopping of antibiotics            Stopping of antibiotics           strongly encouraged.               strongly encouraged.        ----------------------------     ------------------------------           PCT level decrease by               PCT < 0.25 ng/mL           >= 80% from peak PCT           OR PCT 0.25 - 0.5 ng/mL          Stopping of antibiotics  encouraged.         Stopping of antibiotics               encouraged.        ----------------------------     ------------------------------           PCT level decrease by              PCT >= 0.25 ng/mL           < 80% from peak PCT            AND PCT >= 0.5 ng/mL            Continuing antibiotics                                                  encouraged.           Continuing antibiotics                encouraged.        ----------------------------     ------------------------------         PCT level increase compared          PCT > 0.5 ng/mL             with peak PCT AND              PCT >= 0.5 ng/mL             Escalation of antibiotics                                              strongly encouraged.          Escalation of antibiotics            strongly encouraged.  COMPREHENSIVE METABOLIC PANEL     Status: Abnormal   Collection Time    05/10/14  4:50 AM      Result Value Ref Range   Sodium 139  137 - 147 mEq/L   Potassium 5.6 (*) 3.7 - 5.3 mEq/L   Chloride 106  96 - 112 mEq/L   CO2 22  19 - 32 mEq/L   Glucose, Bld 168 (*) 70 - 99 mg/dL   BUN 18  6 - 23  mg/dL   Creatinine, Ser 1.25 (*) 0.50 - 1.10 mg/dL   Calcium 8.1 (*) 8.4 - 10.5 mg/dL   Total Protein 6.4  6.0 - 8.3 g/dL   Albumin 1.9 (*) 3.5 - 5.2 g/dL   AST 9  0 - 37 U/L   ALT 7  0 - 35 U/L   Alkaline Phosphatase 88  39 - 117 U/L   Total Bilirubin 0.4  0.3 - 1.2 mg/dL   GFR calc non Af Amer 43 (*) >90 mL/min   GFR calc Af Amer 50 (*) >90 mL/min   Comment: (NOTE)     The eGFR has been calculated using the CKD EPI equation.     This calculation has not been validated in all clinical situations.     eGFR's persistently <90 mL/min signify possible Chronic Kidney     Disease.   Anion gap 11  5 - 15  CBC     Status: Abnormal  Collection Time    05/10/14  4:50 AM      Result Value Ref Range   WBC 12.2 (*) 4.0 - 10.5 K/uL   RBC 3.31 (*) 3.87 - 5.11 MIL/uL   Hemoglobin 9.7 (*) 12.0 - 15.0 g/dL   HCT 29.3 (*) 36.0 - 46.0 %   MCV 88.5  78.0 - 100.0 fL   MCH 29.3  26.0 - 34.0 pg   MCHC 33.1  30.0 - 36.0 g/dL   RDW 15.2  11.5 - 15.5 %   Platelets 200  150 - 400 K/uL   Comment: DELTA CHECK NOTED     REPEATED TO VERIFY  PROTIME-INR     Status: Abnormal   Collection Time    05/10/14  4:50 AM      Result Value Ref Range   Prothrombin Time 18.9 (*) 11.6 - 15.2 seconds   INR 1.58 (*) 0.00 - 1.49  MAGNESIUM     Status: None   Collection Time    05/10/14  4:50 AM      Result Value Ref Range   Magnesium 2.2  1.5 - 2.5 mg/dL  GLUCOSE, CAPILLARY     Status: Abnormal   Collection Time    05/10/14  8:41 AM      Result Value Ref Range   Glucose-Capillary 171 (*) 70 - 99 mg/dL   Comment 1 Documented in Chart     Comment 2 Notify RN    GLUCOSE, CAPILLARY     Status: Abnormal   Collection Time    05/10/14 11:35 AM      Result Value Ref Range   Glucose-Capillary 179 (*) 70 - 99 mg/dL   Comment 1 Notify RN     Comment 2 Documented in Chart    BASIC METABOLIC PANEL     Status: Abnormal   Collection Time    05/10/14 12:50 PM      Result Value Ref Range   Sodium 134 (*) 137 - 147  mEq/L   Potassium 5.2  3.7 - 5.3 mEq/L   Chloride 102  96 - 112 mEq/L   CO2 23  19 - 32 mEq/L   Glucose, Bld 184 (*) 70 - 99 mg/dL   BUN 19  6 - 23 mg/dL   Creatinine, Ser 1.23 (*) 0.50 - 1.10 mg/dL   Calcium 8.4  8.4 - 10.5 mg/dL   GFR calc non Af Amer 44 (*) >90 mL/min   GFR calc Af Amer 51 (*) >90 mL/min   Comment: (NOTE)     The eGFR has been calculated using the CKD EPI equation.     This calculation has not been validated in all clinical situations.     eGFR's persistently <90 mL/min signify possible Chronic Kidney     Disease.   Anion gap 9  5 - 15  LACTIC ACID, PLASMA     Status: None   Collection Time    05/10/14 12:50 PM      Result Value Ref Range   Lactic Acid, Venous 1.6  0.5 - 2.2 mmol/L    Dg Chest Port 1 View  05/09/2014   CLINICAL DATA:  Central line placement.  EXAM: PORTABLE CHEST - 1 VIEW 5:22 p.m.  COMPARISON:  05/09/2014 at 9:54 a.m.  FINDINGS: Right jugular vein catheter has been inserted and the tip is in the superior vena cava above the right atrium in good position. Heart size is within normal limits. Slight pulmonary vascular prominence. Peribronchial thickening as noted  previously. No infiltrates or effusions.  IMPRESSION: Central line in good position. No pneumothorax. Persistent peribronchial thickening and slight pulmonary vascular prominence.   Electronically Signed   By: Rozetta Nunnery M.D.   On: 05/09/2014 18:04   Dg Chest Port 1 View  05/09/2014   CLINICAL DATA:  68 year old female with shortness of breath and fever.  EXAM: PORTABLE CHEST - 1 VIEW  COMPARISON:  03/03/2014  FINDINGS: Upper limits normal heart size again noted.  Diffuse peribronchial thickening is noted and increased.  No definite airspace disease, pleural effusion or pneumothorax noted.  No acute bony abnormalities are identified.  IMPRESSION: Increasing diffuse peribronchial thickening/bronchitis. No definite focal airspace opacity on this single view.   Electronically Signed   By: Hassan Rowan  M.D.   On: 05/09/2014 11:34   Ct Extrem Lower W Cm Bil  05/09/2014   CLINICAL DATA:  Fever. Soft tissue wound in the left thigh with cellulitis.  EXAM: CT OF THE LOWER BILATERAL EXTREMITY WITH CONTRAST  TECHNIQUE: Multidetector CT imaging of the lower bilateral extremities was performed according to the standard protocol following intravenous contrast administration.  COMPARISON:  None.  CONTRAST:  185m OMNIPAQUE IOHEXOL 300 MG/ML  SOLN  FINDINGS: There is extensive subcutaneous edema throughout the left lower extremity. No acute abnormality of the bones. 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 No osteomyelitis. No joint effusions.  There is no evidence of a soft tissue abscess. There is similar but less extensive subcutaneous edema in the medial aspect of the right thigh.  There is a focal retracted area in the skin surface of the medial aspect of the left thigh consistent with the wound.  Incidental note is made of multiple small dense nodular areas in the subcutaneous fat of the panniculus consistent with injection granulomas.  IMPRESSION: Extensive subcutaneous edema throughout the left lower extremity with a soft tissue wound in the medial aspect of the left thigh. There is no abscess, osteomyelitis, joint effusion, or myositis.   Electronically Signed   By: JRozetta NunneryM.D.   On: 05/09/2014 13:43    Review of Systems  Constitutional: Positive for fever.  HENT: Negative.   Eyes: Negative.   Respiratory: Negative.   Cardiovascular: Negative.   Skin: Negative.   Psychiatric/Behavioral: Negative.    Blood pressure 159/111, pulse 77, temperature 100 F (37.8 C), temperature source Core (Comment), resp. rate 23, height 5' 5"  (1.651 m), weight 158.6 kg (349 lb 10.4 oz), SpO2 100.00%. Physical Exam  Constitutional: She is oriented to person, place, and time. She appears well-developed.  HENT:  Head: Normocephalic and  atraumatic.  Eyes: Conjunctivae are normal. Pupils are equal, round, and reactive to light.  Respiratory: Effort normal.  GI: Soft.  Musculoskeletal:       Legs: Neurological: She is alert and oriented to person, place, and time.  Psychiatric: She has a normal mood and affect. Her behavior is normal. Judgment and thought content normal.    Assessment/Plan: Recommend evaluation for new or recurrent DVT in the lower extremities.  Continue with wet to dry dressings on the thigh wound with kerlex/4x4 gauze, ABD and saline twice a day. Multivitamin, Vit C and Zinc daily. Continue to encourage high protein intake.  SANGER,Talynn Lebon 05/10/2014, 2:59 PM

## 2014-05-10 NOTE — Progress Notes (Signed)
ANTIBIOTIC CONSULT NOTE - FOLLOW UP  Pharmacy Consult for Levaquin, Azactam, Vancomycin Indication: rule out sepsis  Allergies  Allergen Reactions  . Bactrim [Sulfamethoxazole-Tmp Ds] Other (See Comments)    Hyperkalemia (July 2015)  . Penicillins Hives    Patient Measurements: Height: 5\' 5"  (165.1 cm) Weight: 349 lb 10.4 oz (158.6 kg) IBW/kg (Calculated) : 57 Adjusted Body Weight:   Vital Signs: Temp: 99.7 F (37.6 C) (09/14 0800) Temp src: Core (Comment) (09/14 0700) BP: 167/71 mmHg (09/14 0800) Pulse Rate: 77 (09/14 0800) Intake/Output from previous day: 09/13 0701 - 09/14 0700 In: 3525 [P.O.:200; I.V.:1125; Blood:525; IV Piggyback:625] Out: 9518 [ACZYS:0630]  Labs:  Recent Labs  05/09/14 1005 05/09/14 2200 05/10/14 0450  WBC 6.3  --  12.2*  HGB 4.9* 10.7* 9.7*  PLT 105*  --  200  CREATININE 0.55 1.13* 1.25*   Estimated Creatinine Clearance: 67.3 ml/min (by C-G formula based on Cr of 1.25). No results found for this basename: VANCOTROUGH, Corlis Leak, VANCORANDOM, [place], G[place]AK, [place], [place], TOBRAPEAK, TOBRARND, AMIKACINPEAK, AMIKACINTROU, AMIKACIN,  in the last 72 hours   Microbiology: Recent Results (from the past 720 hour(s))  CULTURE, BLOOD (ROUTINE X 2)     Status: None   Collection Time    05/09/14 10:06 AM      Result Value Ref Range Status   Specimen Description BLOOD LEFT FOREARM   Final   Special Requests BOTTLES DRAWN AEROBIC AND ANAEROBIC 3CC   Final   Culture  Setup Time     Final   Value: 05/09/2014 15:16     Performed at [facility]   Culture     Final   Value: GRAM POSITIVE COCCI IN CHAINS     Note: Gram Stain Report Called to,Read Back By and Verified With: Gladys Damme RN on 05/10/14 at 04:03 by Rise Mu     Performed at [facility]   Report Status PENDING   Incomplete  CULTURE, BLOOD (ROUTINE X 2)     Status: None   Collection Time    05/09/14 10:16 AM      Result Value Ref Range Status   Specimen  Description BLOOD RIGHT ANTECUBITAL   Final   Special Requests BOTTLES DRAWN AEROBIC AND ANAEROBIC 3CC   Final   Culture  Setup Time     Final   Value: 05/09/2014 15:16     Performed at [facility]   Culture     Final   Value: [place]     Performed at [facility]   Report Status PENDING   Incomplete  MRSA PCR SCREENING     Status: Abnormal   Collection Time    05/09/14  3:52 PM      Result Value Ref Range Status   MRSA by PCR POSITIVE (*) NEGATIVE Final   Comment:            The GeneXpert MRSA Assay (FDA     approved for NASAL specimens     only), is one component of a     comprehensive MRSA colonization     surveillance program. It is not     intended to diagnose MRSA     infection nor to guide or     monitor treatment for     MRSA infections.     RESULT CALLED TO, READ BACK BY AND VERIFIED WITH:     S.Lawn AT 1725 ON 13SEP15 BY C.BONGEL   Anti-infectives: 9/13 >> aztreonam >>  9/13 >> levofloxacin >>   9/13 >> vancomycin >>   Assessment: 61 yoF admitted 9/13 after having fever up to 102 at home.  PMH includes chronic leg ulcer with wound VAC removed from 2 days PTA. She also notes increasing cough and congestion.   9/14, Day #2 vanc, levaquin, aztreonam  Tmax: 104.4  WBCs: increased to 12.2  Renal: SCr increased to 1.25, CrCl ~ 67 CG, ~ 49 N  Blood cultures with GPC chains  Goal of Therapy:  Vancomycin trough level 15-20 mcg/ml  Plan:   Continue Levaquin 750mg  IV q24h   Continue Aztreonam 2g IV q8h  Decrease to Vancomycin 2000 mg IV q24h.  Measure Vanc trough at steady state.  Follow up renal fxn and culture results.  Gretta Arab PharmD, BCPS Pager [phone] 05/10/2014 8:51 AM

## 2014-05-10 NOTE — Consult Note (Signed)
WOC wound consult note Reason for Consult: Chronic wound on left medial thigh; has been treated in the past at [facility] (where she had surgery) and post operative care by Dr. Migdalia Dk (Plastics).  She has been receiving NPWT at the Rehab facility where she is residing and this was discontinued late last week. The would was to be seen in the outpatient wound care center at [facility] hospital today (05/10/14). 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 Wound type: Infectious, then surgical, non-healing.  Pressure. Pressure Ulcer POA: Yes (Right IT) Measurement:left medial thigh:  21cm x 1.5cm x 1cm (Red, moist base).   Right IT:  7cm x 3cm  Moisture associated lesion (Moist wound bed, beefy red), Stage III PrU measuring 1.5cm x 0.5cm x 0.4cm  (Moist, red wound bed) Wound bed: Drainage (amount, consistency, odor) Scant serous from left LE and scant serosanguinous from right IT Periwound:Dry, white tissue in the periwound for left LE and Macerated white tissue for the right IT  Dressing procedure/placement/frequency:Patient will be placed on a bariatric low air loss mattress replacement to decrease friction and shear.  A soft silicone foam dressing will be used to aid in the healing and to prevent further injury on the right IT affected tissue.  The left medial thigh wound is filling in and is clean.  I will implement twice daily saline dressings to this wound. [facility] nursing team will not follow, but will remain available to this patient, the nursing and medical teams.  Please re-consult if needed. Thanks, Maudie Flakes, MSN, RN, [place], [place], [place] [phone])

## 2014-05-10 NOTE — Progress Notes (Signed)
Patient is refusing CPAP. Patient encouraged to call if she decides to wear one. RN aware.

## 2014-05-10 NOTE — Consult Note (Signed)
[facility] for Infectious Disease    Date of Admission:  05/09/2014           Day 2 vancomycin        Day 2 aztreonam       Reason for Consult: Left leg cellulitis and streptococcal bacteremia    Referring Physician: Dr. Irine Seal   Principal Problem:   Sepsis Active Problems:   Cellulitis of left leg: Probable   Bacteremia   HYPERTENSION   Diabetes mellitus type 2 with neurological manifestations   Obesity hypoventilation syndrome   Morbid obesity   Chronic diastolic heart failure   Ulcer of lower limb, unspecified   Chronic venous insufficiency   Pulmonary embolism   Sinus tachycardia   Chronic kidney disease, stage 3   Diabetic neuropathy   Esophageal reflux   Coronary atherosclerosis of native coronary artery   Hypokalemia   Hypocalcemia   Anemia   Bronchitis: Per CXR   Acidosis   Open thigh wound   DVT (deep venous thrombosis)   . allopurinol  100 mg Oral Daily  . atorvastatin  40 mg Oral Daily  . aztreonam  2 g Intravenous Q8H  . calcium citrate  500 mg of elemental calcium Oral QID  . Chlorhexidine Gluconate Cloth  6 each Topical Q0600  . cholecalciferol  1,000 Units Oral Daily  . docusate sodium  100 mg Oral BID  . enoxaparin  140 mg Subcutaneous Q12H  . furosemide  20 mg Oral Daily  . Influenza vac split quadrivalent PF  0.5 mL Intramuscular Tomorrow-1000  . insulin aspart  0-15 Units Subcutaneous TID WC  . iron polysaccharides  150 mg Oral Daily  . isosorbide mononitrate  10 mg Oral BID  . magnesium oxide  400 mg Oral Daily  . metoprolol tartrate  37.5 mg Oral BID  . mupirocin ointment  1 application Nasal BID  . pantoprazole  80 mg Oral Q1200  . polyethylene glycol  17 g Oral Daily  . senna  1 tablet Oral BID  . sodium chloride  3 mL Intravenous Q12H  . vancomycin  2,000 mg Intravenous Q24H    Recommendations: 1. Continue vancomycin pending final blood culture results 2. Discontinue aztreonam   Assessment: Ms.  Scherger has acute left lower leg cellulitis with streptococcal bacteremia. I will treat her with vancomycin alone pending final culture results. She may have some fungal dermatitis on her left leg that could serve as a portal of entry for skin bacteria.   HPI: Kelly [name] is a 68 y.o. female who developed high fevers, shaking chills and sudden onset of swelling, redness, and pain in her left lower leg 2 days ago leading to admission. She has a remote history of cellulitis in both legs. She is currently being treated for a chronic left thigh wound that resulted after her panniculectomy and a postoperative hematoma. She is also recently been treated for a fungal skin infection on her left foot. She states she is feeling a little bit better today.   Review of Systems: Pertinent items are noted in HPI.  Past Medical History  Diagnosis Date  . Hypertension   . Hyperlipidemia   . CHF (congestive heart failure)   . GERD (gastroesophageal reflux disease)   . Depression   . Lymphedema of leg     left leg  . Pulmonary embolism 02/18/14    diagnosed at [facility] with CT angio chest  . Chronic indwelling  Foley catheter   . Heart murmur     "just found out I have one" (04/07/2014)  . DVT (deep venous thrombosis) 01/2014    LLE; "went from my leg to my lungs"  . Pneumonia 1990's X 2  . Sleep apnea     doesn't use cpap machine or 02 at night (04/07/2014)  . Anemia   . History of blood transfusion 1990's X 2    "when they did my surgeries"  . Migraines     "none for years now" (04/07/2014)  . Arthritis     "arms, legs" (04/07/2014)  . Gout   . Urinary incontinence   . Chronic kidney disease (CKD), stage III (moderate)     [name] 04/07/2014  . Neuropathy   . Diabetes mellitus     "at one time" (04/07/2014)    History  Substance Use Topics  . Smoking status: Former Smoker -- 1.00 packs/day for 33 years    Types: Cigarettes    Quit date: 08/27/1981  . Smokeless tobacco: Never Used  . Alcohol  Use: Yes     Comment: 04/07/2014 "used to drink; stopped in ~ 1983"    Family History  Problem Relation Age of Onset  . Emphysema Father   . Asthma Brother   . Heart disease Mother   . Stomach cancer Brother   . Heart disease Maternal Grandmother   . Diabetes Mother   . Diabetes Sister    Allergies  Allergen Reactions  . Bactrim [Sulfamethoxazole-Tmp Ds] Other (See Comments)    Hyperkalemia (July 2015)  . Penicillins Hives    OBJECTIVE: Blood pressure 164/63, pulse 74, temperature 100 F (37.8 C), temperature source Core (Comment), resp. rate 15, height 5\' 5"  (1.651 m), weight 349 lb 10.4 oz (158.6 kg), SpO2 100.00%. General: She is laying in bed covered with blankets. She is in no distress. Skin: She has multiple plaque-like, scaly patches on her left foot and left popliteal area. Her left lower leg is erythematous and warm Lungs: Clear Cor: Regular S1 and S2 with no murmurs Abdomen: Obese, soft and nontender Joints and extremities: Left thigh wound dressed  Lab Results Lab Results  Component Value Date   WBC 12.2* 05/10/2014   HGB 9.7* 05/10/2014   HCT 29.3* 05/10/2014   MCV 88.5 05/10/2014   PLT 200 05/10/2014    Lab Results  Component Value Date   CREATININE 1.23* 05/10/2014   BUN 19 05/10/2014   NA 134* 05/10/2014   K 5.2 05/10/2014   CL 102 05/10/2014   CO2 23 05/10/2014    Lab Results  Component Value Date   ALT 7 05/10/2014   AST 9 05/10/2014   ALKPHOS 88 05/10/2014   BILITOT 0.4 05/10/2014     Microbiology: Recent Results (from the past 240 hour(s))  CULTURE, BLOOD (ROUTINE X 2)     Status: None   Collection Time    05/09/14 10:06 AM      Result Value Ref Range Status   Specimen Description BLOOD LEFT FOREARM   Final   Special Requests BOTTLES DRAWN AEROBIC AND ANAEROBIC 3CC   Final   Culture  Setup Time     Final   Value: 05/09/2014 15:16     Performed at [facility]   Culture     Final   Value: GRAM POSITIVE COCCI IN CHAINS     Note: Gram  Stain Report Called to,Read Back By and Verified With: Gladys Damme RN on 05/10/14 at 04:03 by  Rise Mu     Performed at [facility]   Report Status PENDING   Incomplete  CULTURE, BLOOD (ROUTINE X 2)     Status: None   Collection Time    05/09/14 10:16 AM      Result Value Ref Range Status   Specimen Description BLOOD RIGHT ANTECUBITAL   Final   Special Requests BOTTLES DRAWN AEROBIC AND ANAEROBIC 3CC   Final   Culture  Setup Time     Final   Value: 05/09/2014 15:16     Performed at [facility]   Culture     Final   Value: GRAM POSITIVE COCCI IN CHAINS     Note: Gram Stain Report Called to,Read Back By and Verified With: ANGIE WELCH@1 :51PM ON 05/10/14 BY COOKV     Performed at [facility]   Report Status PENDING   Incomplete  MRSA PCR SCREENING     Status: Abnormal   Collection Time    05/09/14  3:52 PM      Result Value Ref Range Status   MRSA by PCR POSITIVE (*) NEGATIVE Final   Comment:            The GeneXpert MRSA Assay (FDA     approved for NASAL specimens     only), is one component of a     comprehensive MRSA colonization     surveillance program. It is not     intended to diagnose MRSA     infection nor to guide or     monitor treatment for     MRSA infections.     RESULT CALLED TO, READ BACK BY AND VERIFIED WITH:     S.Idris AT [place] ON 13SEP15 BY C.BONGEL    Michel Bickers, MD [facility] [phone] pager   [phone] cell 05/10/2014, 5:44 PM

## 2014-05-10 NOTE — Progress Notes (Signed)
[facility] PROGRESS NOTE  Kelly [name] RUE:[id number] DOB: 05-02-1946 DOA: 05/09/2014 PCP: Alvester Chou, NP  Assessment/Plan: #1 sepsis Likely secondary to bacteremia and left lower extremity cellulitis/plus or minus left thigh wound. Urinalysis was negative. Chest x-ray negative for acute infiltrate. Urinalysis was negative. Patient still with fevers early this morning. Patient noted to have a leukocytosis with a white count of 12.2 today which was normal yesterday. Patient with a pro calcitonin level of 4.75. Blood cultures growing 2 out of 2 gram-positive cocci in chains. Continue empiric IV vancomycin, IV aztreonam. Continue supportive care. Follow.  #2 gram-positive bacteremia Preliminary wound cultures growing gram-positive cocci in chains. Likely seeded from left thigh wound. Continue empiric IV vancomycin and IV aztreonam. Will consult with ID for further evaluation and management.?? May need a 2-D echo in the next one to 2 days to rule out endocarditis however will defer to ID.  #3 left lower extremity cellulitis with open left thigh wound Preliminary blood cultures growing gram positive cocci in chains. Patient with left thigh wound with good granulation tissue. The patient drainage noted. Continue empiric IV vancomycin IV aztreonam. Consult with ID. Will also consult with Dr. Migdalia Dk of wound care to assess patient's wound.  #4 hypokalemia Potassium has been overcorrected. Repeat be met this afternoon. If still elevated will give some Kayexalate.  #5 hypocalcemia/hypomagnesemia Corrected calcium is 9.8. Ionized calcium was 1.18. Patient's magnesium on admission was 0.7 which has been repleted and currently at 2.2. PTH pending. Vitamin D levels pending. Will continue oral calcium supplementation for one more day and if calcium levels to within normal limits we'll discontinue.  #6 anemia On admission patient's hemoglobin was noted to be 4.9. Patient is status post 2 units packed  red blood cells and hemoglobin currently of 9.7. Initial labs may have been spurious. Patient with no overt bleeding. Anemia panel consistent with anemia of chronic disease/iron deficiency anemia. Continue iron supplementation. Follow H&H.   #7 metabolic acidosis May be secondary to problems #1 and 2. Acidosis has improved on bicarbonate drip. Repeat labs this afternoon and if acidosis has resolved we'll discontinue bicarbonate drip.  #8 type 2 diabetes Hemoglobin A1c pending. CBGs 171. Continue sliding scale insulin.  #9 history of recent PE/DVT/status post IVC filter Diagnosis 02/18/2014. Continue full dose Lovenox.  #10 sinus tachycardia Likely secondary to problems #1,2, 3, 6. Heart rate improved. Patient is status post 2 units packed red blood cells. Follow.  #14 chronic diastolic CHF/coronary artery disease Stable. Continue home regimen of beta blocker, Lasix, Imdur.  #12 hypertension Stable. Continue Lopressor and Imdur.  #13 gastroesophageal reflux disease PPI.  #14 chronic kidney disease stage III Stable.  #15 obesity hypoventilation syndrome/obstructive sleep apnea Patient refused CPAP at night.  #16 prophylaxis PPI for GI prophylaxis Lovenox for DVT prophylaxis.   Code Status: Full Family Communication: Updated patient no family present. Disposition Plan: Remain in step down unit   Consultants:  Pulmonary critical care medicine: Dr. Liane Comber 05/09/2014  Procedures:  CT left lower extremity 05/09/2014  Chest x-ray 05/09/2014  Central line placement 05/09/2014 per Dr. Lake Bells  2 units packed red blood cells 05/09/2014  Antibiotics:  IV vancomycin 05/09/2014  IV history a 05/09/2014  IV Levaquin 05/09/2014>>> 05/10/2014  HPI/Subjective: Patient states she is feeling better.  Objective: Filed Vitals:   05/10/14 0800  BP: 167/71  Pulse: 77  Temp: 99.7 F (37.6 C)  Resp: 15    Intake/Output Summary (Last 24 hours) at 05/10/14 7829 Last data  filed  at 05/10/14 0900  Gross per 24 hour  Intake   3640 ml  Output   1655 ml  Net   1985 ml   Filed Weights   05/09/14 1120 05/09/14 1600 05/10/14 0452  Weight: 144.244 kg (318 lb) 157.4 kg (347 lb 0.1 oz) 158.6 kg (349 lb 10.4 oz)    Exam:   General:  NAD  Cardiovascular: RRR  Respiratory: CTAB  Abdomen: Soft/NT/ND/+BS  Musculoskeletal: No c/c. LLE with 2-3+edema, warmth, TTP, Left thigh wound with wet to dry dressing, with good granulation tissue. Per patient some discharge overnight  Data Reviewed: Basic Metabolic Panel:  Recent Labs Lab 05/09/14 1004 05/09/14 1005 05/09/14 2200 05/10/14 0450  NA  --  145 136* 139  K  --  2.6* 5.0 5.6*  CL  --  122* 103 106  CO2  --  12* 21 22  GLUCOSE  --  82 184* 168*  BUN  --  _0 CREATININE  --  0.55 1.13* 1.25*  CALCIUM  --  4.2* 8.3* 8.1*  MG 0.7*  --   --  2.2  PHOS 1.1*  --   --   --    Liver Function Tests:  Recent Labs Lab 05/09/14 1005 05/10/14 0450  AST 6 9  ALT 5 7  ALKPHOS 54 88  BILITOT <0.2* 0.4  PROT 3.7* 6.4  ALBUMIN 1.1* 1.9*   No results found for this basename: LIPASE, AMYLASE,  in the last 168 hours No results found for this basename: AMMONIA,  in the last 168 hours CBC:  Recent Labs Lab 05/09/14 1005 05/09/14 2200 05/10/14 0450  WBC 6.3  --  12.2*  NEUTROABS 5.4  --   --   HGB 4.9* 10.7* 9.7*  HCT 15.3* 32.2* 29.3*  MCV 92.2  --  88.5  PLT 105*  --  200   Cardiac Enzymes: No results found for this basename: CKTOTAL, CKMB, CKMBINDEX, TROPONINI,  in the last 168 hours BNP (last 3 results) No results found for this basename: PROBNP,  in the last 8760 hours CBG:  Recent Labs Lab 05/10/14 0841  GLUCAP 171*    Recent Results (from the past 240 hour(s))  CULTURE, BLOOD (ROUTINE X 2)     Status: None   Collection Time    05/09/14 10:06 AM      Result Value Ref Range Status   Specimen Description BLOOD LEFT FOREARM   Final   Special Requests BOTTLES DRAWN AEROBIC AND  ANAEROBIC 3CC   Final   Culture  Setup Time     Final   Value: 05/09/2014 15:16     Performed at [facility]   Culture     Final   Value: GRAM POSITIVE COCCI IN CHAINS     Note: Gram Stain Report Called to,Read Back By and Verified With: Gladys Damme RN on 05/10/14 at 04:03 by Rise Mu     Performed at [facility]   Report Status PENDING   Incomplete  CULTURE, BLOOD (ROUTINE X 2)     Status: None   Collection Time    05/09/14 10:16 AM      Result Value Ref Range Status   Specimen Description BLOOD RIGHT ANTECUBITAL   Final   Special Requests BOTTLES DRAWN AEROBIC AND ANAEROBIC 3CC   Final   Culture  Setup Time     Final   Value: 05/09/2014 15:16     Performed at [facility]  Final   Value: GRAM POSITIVE COCCI IN CHAINS     Performed at [facility]   Report Status PENDING   Incomplete  MRSA PCR SCREENING     Status: Abnormal   Collection Time    05/09/14  3:52 PM      Result Value Ref Range Status   MRSA by PCR POSITIVE (*) NEGATIVE Final   Comment:            The GeneXpert MRSA Assay (FDA     approved for NASAL specimens     only), is one component of a     comprehensive MRSA colonization     surveillance program. It is not     intended to diagnose MRSA     infection nor to guide or     monitor treatment for     MRSA infections.     RESULT CALLED TO, READ BACK BY AND VERIFIED WITH:     S.Seay AT [id number] ON [id number] BY C.BONGEL     Studies: Dg Chest Port 1 View  05/09/2014   CLINICAL DATA:  Central line placement.  EXAM: PORTABLE CHEST - 1 VIEW 5:22 p.m.  COMPARISON:  05/09/2014 at 9:54 a.m.  FINDINGS: Right jugular vein catheter has been inserted and the tip is in the superior vena cava above the right atrium in good position. Heart size is within normal limits. Slight pulmonary vascular prominence. Peribronchial thickening as noted previously. No infiltrates or effusions.  IMPRESSION: Central line in good position. No  pneumothorax. Persistent peribronchial thickening and slight pulmonary vascular prominence.   Electronically Signed   By: Rozetta Nunnery M.D.   On: 05/09/2014 18:04   Dg Chest Port 1 View  05/09/2014   CLINICAL DATA:  68 year old female with shortness of breath and fever.  EXAM: PORTABLE CHEST - 1 VIEW  COMPARISON:  03/03/2014  FINDINGS: Upper limits normal heart size again noted.  Diffuse peribronchial thickening is noted and increased.  No definite airspace disease, pleural effusion or pneumothorax noted.  No acute bony abnormalities are identified.  IMPRESSION: Increasing diffuse peribronchial thickening/bronchitis. No definite focal airspace opacity on this single view.   Electronically Signed   By: Hassan Rowan M.D.   On: 05/09/2014 11:34   Ct Extrem Lower W Cm Bil  05/09/2014   CLINICAL DATA:  Fever. Soft tissue wound in the left thigh with cellulitis.  EXAM: CT OF THE LOWER BILATERAL EXTREMITY WITH CONTRAST  TECHNIQUE: Multidetector CT imaging of the lower bilateral extremities was performed according to the standard protocol following intravenous contrast administration.  COMPARISON:  None.  CONTRAST:  180m OMNIPAQUE IOHEXOL 300 MG/ML  SOLN  FINDINGS: There is extensive subcutaneous edema throughout the left lower extremity. No acute abnormality of the bones. 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 No osteomyelitis. No joint effusions.  There is no evidence of a soft tissue abscess. There is similar but less extensive subcutaneous edema in the medial aspect of the right thigh.  There is a focal retracted area in the skin surface of the medial aspect of the left thigh consistent with the wound.  Incidental note is made of multiple small dense nodular areas in the subcutaneous fat of the panniculus consistent with injection granulomas.  IMPRESSION: Extensive subcutaneous edema throughout the left lower extremity with a soft tissue wound in  the medial aspect of the left thigh. There is no abscess, osteomyelitis, joint effusion, or myositis.  Electronically Signed   By: Rozetta Nunnery M.D.   On: 05/09/2014 13:43    Scheduled Meds: . allopurinol  100 mg Oral Daily  . atorvastatin  40 mg Oral Daily  . aztreonam  2 g Intravenous Q8H  . calcium citrate  500 mg of elemental calcium Oral QID  . Chlorhexidine Gluconate Cloth  6 each Topical Q0600  . cholecalciferol  1,000 Units Oral Daily  . docusate sodium  100 mg Oral BID  . enoxaparin  140 mg Subcutaneous Q12H  . furosemide  20 mg Oral Daily  . Influenza vac split quadrivalent PF  0.5 mL Intramuscular Tomorrow-1000  . insulin aspart  0-15 Units Subcutaneous TID WC  . iron polysaccharides  150 mg Oral Daily  . isosorbide mononitrate  10 mg Oral BID  . magnesium oxide  400 mg Oral Daily  . metoprolol tartrate  37.5 mg Oral BID  . mupirocin ointment  1 application Nasal BID  . pantoprazole  80 mg Oral Q1200  . polyethylene glycol  17 g Oral Daily  . senna  1 tablet Oral BID  . sodium chloride  3 mL Intravenous Q12H  . vancomycin  2,000 mg Intravenous Q24H   Continuous Infusions: . sodium chloride    . dextrose 5 % 1,000 mL with sodium bicarbonate 150 mEq infusion 10 mL/hr at 05/10/14 3888    Principal Problem:   Sepsis Active Problems:   Bacteremia   HYPERTENSION   Diabetes mellitus type 2 with neurological manifestations   Obesity hypoventilation syndrome   Morbid obesity   Chronic diastolic heart failure   Ulcer of lower limb, unspecified   Chronic venous insufficiency   Pulmonary embolism   Sinus tachycardia   Chronic kidney disease, stage 3   Diabetic neuropathy   Esophageal reflux   Coronary atherosclerosis of native coronary artery   Cellulitis of left leg: Probable   Hypokalemia   Hypocalcemia   Anemia   Bronchitis: Per CXR   Acidosis   Open thigh wound    Time spent: 40 mins    [facility] MD [facility] Pager [phone]. If  7PM-7AM, please contact night-coverage at [email or web address], password [facility] 05/10/2014, 9:59 AM  LOS: 1 day

## 2014-05-10 NOTE — Progress Notes (Signed)
OT Cancellation Note  Patient Details Name: [name] MRN: [id number] DOB: Nov 26, 1945   Cancelled Treatment:    Spoke with RN.  Pt had worked with PT and needed significant assistance and was weak. OT will recheck on pt next day . From reading OT eval - pt will likely need significant A with ADL activity  Thanks, Kari Baars, OT Lin Landsman, Thereasa Parkin 05/10/2014, 3:00 PM

## 2014-05-10 NOTE — Evaluation (Signed)
Physical Therapy Evaluation Patient Details Name: MCKINZEY ENTWISTLE MRN: [id number] DOB: March 08, 1946 Today's Date: 05/10/2014   History of Present Illness  68 year old female admitted 05/09/14 with fever and LLE pain.Patient has a  history of chronic diastolic heart failure, hypertension, hyperlipidemia, diabetes mellitus type 2, obstructive sleep apnea, GERD, depression, chronic lymphedema, and indwelling Foley catheter. Recently had I/D  L thigh wound  in 6/15 with multiple complications  including PE. Patient 's leg wound being managed by Wound center.  Clinical Impression  Patient c/o increased pain of L leg with attempts to stand. Much effort to get to the edge of the bed and back into bed. Unable  To stand today due to pain. . Pt will  Benefit from PT to address problems listed in note.     Follow Up Recommendations SNF    Equipment Recommendations  None recommended by PT    Recommendations for Other Services       Precautions / Restrictions Precautions Precautions: Fall Precaution Comments: L thigh wound,      Mobility  Bed Mobility Overal bed mobility: Needs Assistance;+2 for physical assistance;+ 2 for safety/equipment Bed Mobility: Supine to Sit     Supine to sit: Max assist;+2 for physical assistance;+2 for safety/equipment;HOB elevated     General bed mobility comments: use of Rail and bed pad to  turn patient, assist for legs to edge of bed . Use of bed pad to get patient back into bed and assist with both legs lifted back onto bed.  Transfers                 General transfer comment: patient unable to tolerated any weight on L leg with attempts to stand.  Ambulation/Gait                Stairs            Wheelchair Mobility    Modified Rankin (Stroke Patients Only)       Balance Overall balance assessment: Needs assistance Sitting-balance support: Bilateral upper extremity supported;Feet supported Sitting balance-Leahy Scale: Fair                                       Pertinent Vitals/Pain Pain Assessment: 0-10 Pain Score: 10-Worst pain ever Pain Descriptors / Indicators: Crying;Throbbing;Tightness Pain Intervention(s): Limited activity within patient's tolerance;Premedicated before session;Repositioned    Home Living Family/patient expects to be discharged to:: Skilled nursing facility                 Additional Comments: Pt from [facility] and plans to return    Prior Function Level of Independence: Needs assistance   Gait / Transfers Assistance Needed: pt reports she could ambulate short distances in SNF and did have PT 05/08/14 and ambulated  With RW.          Hand Dominance        Extremity/Trunk Assessment   Upper Extremity Assessment: Generalized weakness           Lower Extremity Assessment: RLE deficits/detail;LLE deficits/detail RLE Deficits / Details: moves R leg in bed. LLE Deficits / Details: pt requires assist to move leg   Cervical / Trunk Assessment: Normal  Communication   Communication: No difficulties  Cognition Arousal/Alertness: Awake/alert Behavior During Therapy: WFL for tasks assessed/performed Overall Cognitive Status: Within Functional Limits for tasks assessed  General Comments      Exercises        Assessment/Plan    PT Assessment Patient needs continued PT services  PT Diagnosis Difficulty walking;Generalized weakness;Acute pain   PT Problem List Decreased strength;Decreased activity tolerance;Decreased mobility;Decreased knowledge of use of DME;Decreased safety awareness;Decreased knowledge of precautions;Decreased range of motion;Pain  PT Treatment Interventions DME instruction;Gait training;Functional mobility training;Therapeutic activities;Therapeutic exercise;Patient/family education   PT Goals (Current goals can be found in the Care Plan section) Acute Rehab PT Goals Patient Stated Goal: i want to  be able to get up and walk. PT Goal Formulation: With patient Time For Goal Achievement: 05/24/14 Potential to Achieve Goals: Good    Frequency Min 3X/week   Barriers to discharge        Co-evaluation               End of Session   Activity Tolerance: Patient limited by pain Patient left: in bed;with nursing/sitter in room Nurse Communication: Mobility status (overhead lift  not able to be used due to location of the  wound on the thigh and the lift pad.)         Time: 6659-9357 PT Time Calculation (min): 45 min   Charges:   PT Evaluation $Initial PT Evaluation Tier I: 1 Procedure PT Treatments $Therapeutic Activity: 38-52 mins   PT G Codes:          Claretha Cooper 05/10/2014, 2:32 PM Tresa Endo PT [phone]

## 2014-05-10 NOTE — Progress Notes (Signed)
Call received from [facility] reporting Gram(+) cocci in chains in blood culture from 05/09/14. Dr Grandville Silos aware as noted in a.m. Progress note. Amelia Jo, RN

## 2014-05-10 NOTE — Consult Note (Signed)
PULMONARY  / CRITICAL CARE MEDICINE CONSULTATION   Name: Kelly [name] MRN: [id number] DOB: 1945-10-31    ADMISSION DATE:  05/09/2014 CONSULTATION DATE: 05/09/14  REQUESTING CLINICIAN: Dr. Grandville Silos PRIMARY SERVICE: [facility]  CHIEF COMPLAINT:  Sepsis  BRIEF PATIENT DESCRIPTION: 68yof with morbid obesity, diastolic CHF, HTN, DM and chronic left leg ulcer, DVT presents with cc of AMS, fevers, found to be septic likely 2/2 leg source.     SIGNIFICANT EVENTS / STUDIES:  05/09/14:  Admission 05/09/14:  Hg on admit was 4.9  LINES / TUBES: RIJ TLC:   05/09/14-->  CULTURES: 05/09/14:  Blood cx >> GPC chains 05/09/14  Urine  >>   ANTIBIOTICS: Vanc: 05/09/14-->   Aztreonam: 05/09/14-->   Levaquin: 05/09/14-->    SUBJECTIVE:  CT LLE performed as above > no evidence tracking infxn or gas Positive blood cx C/o LLE pain, no other issues  VITAL SIGNS: Temp:  [99.7 F (37.6 C)-104.4 F (40.2 C)] 99.7 F (37.6 C) (09/14 0800) Pulse Rate:  [77-127] 77 (09/14 0800) Resp:  [14-28] 15 (09/14 0800) BP: (114-211)/(37-76) 167/71 mmHg (09/14 0800) SpO2:  [94 %-100 %] 100 % (09/14 0800) Weight:  [144.244 kg (318 lb)-158.6 kg (349 lb 10.4 oz)] 158.6 kg (349 lb 10.4 oz) (09/14 0452) HEMODYNAMICS:   VENTILATOR SETTINGS:   INTAKE / OUTPUT: Intake/Output     09/13 0701 - 09/14 0700 09/14 0701 - 09/15 0700   P.O. 200    I.V. (mL/kg) 1125 (7.1) 75 (0.5)   Blood 525    Other 1050    IV Piggyback 625    Total Intake(mL/kg) 3525 (22.2) 75 (0.5)   Urine (mL/kg/hr) 1575 80 (0.2)   Total Output 1575 80   Net +1950 -5          PHYSICAL EXAMINATION: General:  Obese, NAD Neuro:  Nonfocal, PERRL HEENT:  Supple, unable to appreciate JVD 2/2 habitus Cardiovascular:  Slightly tachy but regular, no murmurs Lungs:  CTAB with good air movement, no wheezes or crackles Abdomen:  +BS, Soft Musculoskeletal:  MAEW Skin:  Large L inner thigh wound, bandage c/d/i, significant LLE swelling, some  warmth  LABS:  CBC  Recent Labs Lab 05/09/14 1005 05/09/14 2200 05/10/14 0450  WBC 6.3  --  12.2*  HGB 4.9* 10.7* 9.7*  HCT 15.3* 32.2* 29.3*  PLT 105*  --  200   Coag's  Recent Labs Lab 05/09/14 1700 05/10/14 0450  INR 1.28 1.58*   BMET  Recent Labs Lab 05/09/14 1005 05/09/14 2200 05/10/14 0450  NA 145 136* 139  K 2.6* 5.0 5.6*  CL 122* 103 106  CO2 12* 21 22  BUN 10 17 18   CREATININE 0.55 1.13* 1.25*  GLUCOSE 82 184* 168*   Electrolytes  Recent Labs Lab 05/09/14 1004 05/09/14 1005 05/09/14 2200 05/10/14 0450  CALCIUM  --  4.2* 8.3* 8.1*  MG 0.7*  --   --  2.2  PHOS 1.1*  --   --   --    Sepsis Markers  Recent Labs Lab 05/09/14 1004 05/09/14 1127 05/10/14 0450  LATICACIDVEN  --  1.10  --   PROCALCITON 0.85  --  4.75   ABG No results found for this basename: PHART, PCO2ART, PO2ART,  in the last 168 hours Liver Enzymes  Recent Labs Lab 05/09/14 1005 05/10/14 0450  AST 6 9  ALT 5 7  ALKPHOS 54 88  BILITOT <0.2* 0.4  ALBUMIN 1.1* 1.9*   Cardiac Enzymes No results found for  this basename: TROPONINI, PROBNP,  in the last 168 hours Glucose  Recent Labs Lab 05/10/14 0841  GLUCAP 171*    Imaging Dg Chest Port 1 View  05/09/2014   CLINICAL DATA:  Central line placement.  EXAM: PORTABLE CHEST - 1 VIEW 5:22 p.m.  COMPARISON:  05/09/2014 at 9:54 a.m.  FINDINGS: Right jugular vein catheter has been inserted and the tip is in the superior vena cava above the right atrium in good position. Heart size is within normal limits. Slight pulmonary vascular prominence. Peribronchial thickening as noted previously. No infiltrates or effusions.  IMPRESSION: Central line in good position. No pneumothorax. Persistent peribronchial thickening and slight pulmonary vascular prominence.   Electronically Signed   By: Rozetta Nunnery M.D.   On: 05/09/2014 18:04   Dg Chest Port 1 View  05/09/2014   CLINICAL DATA:  68 year old female with shortness of breath and  fever.  EXAM: PORTABLE CHEST - 1 VIEW  COMPARISON:  03/03/2014  FINDINGS: Upper limits normal heart size again noted.  Diffuse peribronchial thickening is noted and increased.  No definite airspace disease, pleural effusion or pneumothorax noted.  No acute bony abnormalities are identified.  IMPRESSION: Increasing diffuse peribronchial thickening/bronchitis. No definite focal airspace opacity on this single view.   Electronically Signed   By: Hassan Rowan M.D.   On: 05/09/2014 11:34   Ct Extrem Lower W Cm Bil  05/09/2014   CLINICAL DATA:  Fever. Soft tissue wound in the left thigh with cellulitis.  EXAM: CT OF THE LOWER BILATERAL EXTREMITY WITH CONTRAST  TECHNIQUE: Multidetector CT imaging of the lower bilateral extremities was performed according to the standard protocol following intravenous contrast administration.  COMPARISON:  None.  CONTRAST:  122mL OMNIPAQUE IOHEXOL 300 MG/ML  SOLN  FINDINGS: There is extensive subcutaneous edema throughout the left lower extremity. No acute abnormality of the bones. 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 No osteomyelitis. No joint effusions.  There is no evidence of a soft tissue abscess. There is similar but less extensive subcutaneous edema in the medial aspect of the right thigh.  There is a focal retracted area in the skin surface of the medial aspect of the left thigh consistent with the wound.  Incidental note is made of multiple small dense nodular areas in the subcutaneous fat of the panniculus consistent with injection granulomas.  IMPRESSION: Extensive subcutaneous edema throughout the left lower extremity with a soft tissue wound in the medial aspect of the left thigh. There is no abscess, osteomyelitis, joint effusion, or myositis.   Electronically Signed   By: Rozetta Nunnery M.D.   On: 05/09/2014 13:43    EKG: Sinus tach with rate of about 100, no ST changes CXR: Increase vascular  prominence, no focal infiltates  ASSESSMENT / PLAN:  PULMONARY A: No acute resp issues or complaints.  Stable on RA.  Suspicion for PNA low.  Pt does have h/o PE in late June.     P:    O2 prn Agree with treatment dose lovenox for PE  CARDIOVASCULAR A: Chronic diastolic CHF HTN  P:   Agree with continuing home meds Would aim for euvolemia for now and use additional PRN lasix to achieve this  RENAL A: Hyperkalemia Metabolic acidosis Acute renal failure, non-oliguric P:   Potassium has overcorrected (? Whether her initial labs were accurate) Follow BMET with hydration Will change bicarb to [facility] pending next BMP 9/14 pm. Suspect we  will be able to d/c  GASTROINTESTINAL A: No signs of GIB  P:   Modified carb diet Monitor for signs of possible GIB  HEMATOLOGIC A: Pt anemic with Hg of 4.9.  Now s/p 2 units PRBC's.  No clear sign of bleed.  Question whether this is spurious value as recent Hg was 9.0  P:   Follow CBC post PRBC Would check iron studies Consider hemolysis labs if Hgb drops again  INFECTIOUS A: Sepsis (without shock) GPC bacteremia, likely source is LLE wound; CT LLE reassuring  P:   D/c Levaquin Cont Vanc/ aztreonam Plastics and wound care consults   ENDOCRINE A: DM  P:   Agree with SSI  NEUROLOGIC A: No acute issues  P:   Monitor for changes  TODAY'S SUMMARY: 68yof with morbid obesity, diastolic CHF, HTN, DM and chronic left leg ulcer, DVT presents with sepsis and ARF due to [place] bacteremia  I have personally obtained a history, examined the patient, evaluated laboratory and imaging results, formulated the assessment and plan and placed orders.   Baltazar Apo, MD, PhD 05/10/2014, 9:19 AM [facility] [phone] or if no answer [phone]

## 2014-05-11 ENCOUNTER — Encounter (HOSPITAL_COMMUNITY): Payer: Self-pay | Admitting: *Deleted

## 2014-05-11 DIAGNOSIS — M79609 Pain in unspecified limb: Secondary | ICD-10-CM

## 2014-05-11 LAB — CBC
HEMATOCRIT: 28.2 % — AB (ref 36.0–46.0)
Hemoglobin: 9.4 g/dL — ABNORMAL LOW (ref 12.0–15.0)
MCH: 29.4 pg (ref 26.0–34.0)
MCHC: 33.3 g/dL (ref 30.0–36.0)
MCV: 88.1 fL (ref 78.0–100.0)
Platelets: 184 10*3/uL (ref 150–400)
RBC: 3.2 MIL/uL — AB (ref 3.87–5.11)
RDW: 15.1 % (ref 11.5–15.5)
WBC: 11.1 10*3/uL — ABNORMAL HIGH (ref 4.0–10.5)

## 2014-05-11 LAB — GLUCOSE, CAPILLARY
Glucose-Capillary: 123 mg/dL — ABNORMAL HIGH (ref 70–99)
Glucose-Capillary: 151 mg/dL — ABNORMAL HIGH (ref 70–99)
Glucose-Capillary: 139 mg/dL — ABNORMAL HIGH (ref 70–99)
Glucose-Capillary: 148 mg/dL — ABNORMAL HIGH (ref 70–99)

## 2014-05-11 LAB — PROCALCITONIN: Procalcitonin: 3.94 ng/mL

## 2014-05-11 LAB — BASIC METABOLIC PANEL
Anion gap: 9 (ref 5–15)
BUN: 18 mg/dL (ref 6–23)
CO2: 24 meq/L (ref 19–32)
Calcium: 8.4 mg/dL (ref 8.4–10.5)
Chloride: 103 mEq/L (ref 96–112)
Creatinine, Ser: 1.21 mg/dL — ABNORMAL HIGH (ref 0.50–1.10)
GFR calc Af Amer: 52 mL/min — ABNORMAL LOW (ref >90)
GFR, EST NON AFRICAN AMERICAN: 45 mL/min — AB (ref >90)
GLUCOSE: 119 mg/dL — AB (ref 70–99)
POTASSIUM: 4.7 meq/L (ref 3.7–5.3)
Sodium: 136 mEq/L — ABNORMAL LOW (ref 137–147)

## 2014-05-11 LAB — PHOSPHORUS: Phosphorus: 3 mg/dL (ref 2.3–4.6)

## 2014-05-11 LAB — MAGNESIUM: Magnesium: 1.9 mg/dL (ref 1.5–2.5)

## 2014-05-11 MED ORDER — LEVALBUTEROL HCL 0.63 MG/3ML IN NEBU: 0.6300 mg | INHALATION_SOLUTION | RESPIRATORY_TRACT | Status: DC | PRN

## 2014-05-11 MED ORDER — IPRATROPIUM BROMIDE 0.02 % IN SOLN: 0.5000 mg | RESPIRATORY_TRACT | Status: DC | PRN

## 2014-05-11 MED ORDER — HYDRALAZINE HCL 20 MG/ML IJ SOLN
10.0000 mg | Freq: Once | INTRAMUSCULAR | Status: AC
  Administered 2014-05-11: 10 mg via INTRAVENOUS
  Filled 2014-05-11: qty 1

## 2014-05-11 NOTE — Progress Notes (Addendum)
Clinical Social Work Department CLINICAL SOCIAL WORK PLACEMENT NOTE 05/11/2014  Patient:  Kelly [name], Kelly [name]  Account Number:  [id number] Admit date:  05/09/2014  Clinical Social Worker:  Ulyess Blossom  Date/time:  05/11/2014 05:00 PM  Clinical Social Work is seeking post-discharge placement for this patient at the following level of care:   [place]   (*CSW will update this form in Epic as items are completed)   05/11/2014  Patient/family provided with [place] Department of Clinical Social Work's list of facilities offering this level of care within the geographic area requested by the patient (or if unable, by the patient's family).  05/11/2014  Patient/family informed of their freedom to choose among providers that offer the needed level of care, that participate in Medicare, Medicaid or managed care program needed by the patient, have an available bed and are willing to accept the patient.  05/11/2014  Patient/family informed of [facility]' ownership interest in [facility], as well as of the fact that they are under no obligation to receive care at this facility.  PASARR submitted to EDS on 05/11/2014 PASARR number received on 05/11/2014  FL2 transmitted to all facilities in geographic area requested by pt/family on  05/11/2014 FL2 transmitted to all facilities within larger geographic area on   Patient informed that his/her managed care company has contracts with or will negotiate with  certain facilities, including the following:     Patient/family informed of bed offers received: 05/12/2014  Patient chooses bed at  Physician recommends and patient chooses bed at    Patient to be transferred to  on   Patient to be transferred to facility by  Patient and family notified of transfer on  Name of family member notified:    The following physician request were entered in Epic:   Additional Comments:   Alison Murray, MSW, [place]  Work [phone]

## 2014-05-11 NOTE — Progress Notes (Signed)
OT Cancellation Note  Patient Details Name: Kelly [name] MRN: [id number] DOB: 06-18-1946   Cancelled Treatment:    Reason Eval/Treat Not Completed: Other (comment) Note pt requiring +2 max assist with sitting EOB only and pt is from a SNF. Will defer OT eval at SNF. Please notify if OT eval needed however for return. [phone]  Jules Schick [phone] 05/11/2014, 11:32 AM

## 2014-05-11 NOTE — Progress Notes (Signed)
Midlevel called d/t pt's b/p >570 systolic. B/p checked in bil arms. Cuff changed and pt given labetolo w/o any change awaiting call back.

## 2014-05-11 NOTE — Progress Notes (Signed)
[facility] PROGRESS NOTE  Kelly [name] HQI:[id number] DOB: Jun 17, 1946 DOA: 05/09/2014 PCP: Alvester Chou, NP  Assessment/Plan: #1 sepsis Likely secondary to bacteremia and left lower extremity cellulitis. Urinalysis was negative. Chest x-ray negative for acute infiltrate. Urinalysis was negative. Patient still with fevers early this morning. Patient noted to have a leukocytosis with a white count of 11. Patient with a pro calcitonin level of 3.94 from 4.75. Blood cultures growing 2 out of 2 gram-positive cocci in chains. Continue empiric IV vancomycin. Continue supportive care. Follow.  #2 gram-positive bacteremia Preliminary wound cultures growing gram-positive cocci in chains. Likely seeded from left thigh wound or fungal dermatitis noted on foot. Continue empiric IV vancomycin. ID has assessed patient and aztreonam d/c'd. ID ff and appreciate input and rxcs.  #3 left lower extremity cellulitis with open left thigh wound Preliminary blood cultures growing gram positive cocci in chains. Patient with left thigh wound with good granulation tissue. The patient drainage noted. Continue empiric IV vancomycin.  ID has seen patient and aztreonam d/c'd. Patient has also been seen by plastics, Dr Migdalia Dk. Continue wound care.  #4 hypokalemia Potassium has been repleted.   #5 hypocalcemia/hypomagnesemia Likely secondary to VIT D deficiency. Corrected calcium is 9.8. Ionized calcium was 1.18. Patient's magnesium on admission was 0.7 which has been repleted and currently at 2.2. Phosphorus level is 3.0. PTH elevated at 161.  Vitamin D 25- hydroxy levels low at 19.  Will continue oral calcium supplementation and VIT D. Outpatient follow up.  #6 anemia On admission patient's hemoglobin was noted to be 4.9. Patient is status post 2 units packed red blood cells and hemoglobin currently of 9.4. Initial labs may have been spurious. Patient with no overt bleeding. Anemia panel consistent with anemia of  chronic disease/iron deficiency anemia. Continue iron supplementation. Follow H&H.   #7 metabolic acidosis May be secondary to problems #1 and 2. Acidosis has improved on bicarbonate drip. D/C bicarbonate drip.  #8 type 2 diabetes Hemoglobin A1c pending. CBGs 152-163. Continue sliding scale insulin.  #9 history of recent PE/DVT/status post IVC filter Diagnosis 02/18/2014. Continue full dose Lovenox.  #10 sinus tachycardia Likely secondary to problems #1,2, 3, 6. Heart rate improved. Patient is status post 2 units packed red blood cells. Follow.  #13 chronic diastolic CHF/coronary artery disease Stable. Continue home regimen of beta blocker, Lasix, Imdur.  #12 hypertension Stable. Continue Lopressor,lasix and Imdur.  #13 gastroesophageal reflux disease PPI.  #14 chronic kidney disease stage III Stable.  #15 obesity hypoventilation syndrome/obstructive sleep apnea Patient refused CPAP at night.  #16 prophylaxis PPI for GI prophylaxis Lovenox for DVT prophylaxis.   Code Status: Full Family Communication: Updated patient no family present. Disposition Plan: Remain in step down unit   Consultants:  Pulmonary critical care medicine: Dr. Liane Comber 05/09/2014  ID: Dr Megan Salon 05/10/14  Plastics/wound care: Dr Migdalia Dk 05/10/14  Procedures:  CT left lower extremity 05/09/2014  Chest x-ray 05/09/2014  Central line placement 05/09/2014 per Dr. Lake Bells  2 units packed red blood cells 05/09/2014  Antibiotics:  IV vancomycin 05/09/2014  IV Aztreonam 05/09/2014>>>05/10/14  IV Levaquin 05/09/2014>>> 05/10/2014  HPI/Subjective: Patient states she is feeling better.   Objective: Filed Vitals:   05/11/14 0700  BP: 181/58  Pulse: 76  Temp: 99.1 F (37.3 C)  Resp: 13    Intake/Output Summary (Last 24 hours) at 05/11/14 0818 Last data filed at 05/11/14 0600  Gross per 24 hour  Intake 2102.75 ml  Output   1450 ml  Net 652.75 ml  Filed Weights   05/09/14 1600  05/10/14 0452 05/11/14 0400  Weight: 157.4 kg (347 lb 0.1 oz) 158.6 kg (349 lb 10.4 oz) 148 kg (326 lb 4.5 oz)    Exam:   General:  NAD  Cardiovascular: RRR  Respiratory: CTAB  Abdomen: Soft/NT/ND/+BS  Musculoskeletal: No c/c. LLE with 2-3+edema,erythema, warmth, TTP, Left thigh wound with wet to dry dressing, with good granulation tissue.  Data Reviewed: Basic Metabolic Panel:  Recent Labs Lab 05/09/14 1004 05/09/14 1005 05/09/14 1751 05/09/14 2200 05/10/14 0450 05/10/14 1250 05/11/14 0515  NA  --  145  --  136* 139 134* 136*  K  --  2.6*  --  5.0 5.6* 5.2 4.7  CL  --  122*  --  103 106 102 103  CO2  --  12*  --  21 22 23 24   GLUCOSE  --  82  --  184* 168* 184* 119*  BUN  --  10  --  17 18 19 18   CREATININE  --  0.55  --  1.13* 1.25* 1.23* 1.21*  CALCIUM  --  4.2* 8.1* 8.3* 8.1* 8.4 8.4  MG 0.7*  --   --   --  2.2  --  1.9  PHOS 1.1*  --   --   --   --   --  3.0   Liver Function Tests:  Recent Labs Lab 05/09/14 1005 05/10/14 0450  AST 6 9  ALT 5 7  ALKPHOS 54 88  BILITOT <0.2* 0.4  PROT 3.7* 6.4  ALBUMIN 1.1* 1.9*   No results found for this basename: LIPASE, AMYLASE,  in the last 168 hours No results found for this basename: AMMONIA,  in the last 168 hours CBC:  Recent Labs Lab 05/09/14 1005 05/09/14 2200 05/10/14 0450 05/11/14 0515  WBC 6.3  --  12.2* 11.1*  NEUTROABS 5.4  --   --   --   HGB 4.9* 10.7* 9.7* 9.4*  HCT 15.3* 32.2* 29.3* 28.2*  MCV 92.2  --  88.5 88.1  PLT 105*  --  200 184   Cardiac Enzymes: No results found for this basename: CKTOTAL, CKMB, CKMBINDEX, TROPONINI,  in the last 168 hours BNP (last 3 results) No results found for this basename: PROBNP,  in the last 8760 hours CBG:  Recent Labs Lab 05/09/14 2215 05/10/14 0841 05/10/14 1135 05/10/14 1726 05/10/14 2103  GLUCAP 201* 171* 179* 163* 152*    Recent Results (from the past 240 hour(s))  CULTURE, BLOOD (ROUTINE X 2)     Status: None   Collection Time     05/09/14 10:06 AM      Result Value Ref Range Status   Specimen Description BLOOD LEFT FOREARM   Final   Special Requests BOTTLES DRAWN AEROBIC AND ANAEROBIC 3CC   Final   Culture  Setup Time     Final   Value: 05/09/2014 15:16     Performed at [facility]   Culture     Final   Value: GRAM POSITIVE COCCI IN CHAINS     Note: Gram Stain Report Called to,Read Back By and Verified With: Gladys Damme RN on 05/10/14 at 04:03 by Rise Mu     Performed at [facility]   Report Status PENDING   Incomplete  CULTURE, BLOOD (ROUTINE X 2)     Status: None   Collection Time    05/09/14 10:16 AM      Result Value Ref Range Status  Specimen Description BLOOD RIGHT ANTECUBITAL   Final   Special Requests BOTTLES DRAWN AEROBIC AND ANAEROBIC 3CC   Final   Culture  Setup Time     Final   Value: 05/09/2014 15:16     Performed at [facility]   Culture     Final   Value: GRAM POSITIVE COCCI IN CHAINS     Note: Gram Stain Report Called to,Read Back By and Verified With: ANGIE WELCH@1 :51PM ON 05/10/14 BY COOKV     Performed at [facility]   Report Status PENDING   Incomplete  URINE CULTURE     Status: None   Collection Time    05/09/14 10:29 AM      Result Value Ref Range Status   Specimen Description URINE, CATHETERIZED   Final   Special Requests NONE   Final   Culture  Setup Time     Final   Value: 05/09/2014 19:58     Performed at [place] PENDING   Incomplete   Culture     Final   Value: Culture reincubated for better growth     Performed at [facility]   Report Status PENDING   Incomplete  MRSA PCR SCREENING     Status: Abnormal   Collection Time    05/09/14  3:52 PM      Result Value Ref Range Status   MRSA by PCR POSITIVE (*) NEGATIVE Final   Comment:            The GeneXpert MRSA Assay (FDA     approved for NASAL specimens     only), is one component of a     comprehensive MRSA colonization     surveillance program.  It is not     intended to diagnose MRSA     infection nor to guide or     monitor treatment for     MRSA infections.     RESULT CALLED TO, READ BACK BY AND VERIFIED WITH:     S.Leece AT [id number] ON [id number] BY C.BONGEL     Studies: Dg Chest Port 1 View  05/09/2014   CLINICAL DATA:  Central line placement.  EXAM: PORTABLE CHEST - 1 VIEW 5:22 p.m.  COMPARISON:  05/09/2014 at 9:54 a.m.  FINDINGS: Right jugular vein catheter has been inserted and the tip is in the superior vena cava above the right atrium in good position. Heart size is within normal limits. Slight pulmonary vascular prominence. Peribronchial thickening as noted previously. No infiltrates or effusions.  IMPRESSION: Central line in good position. No pneumothorax. Persistent peribronchial thickening and slight pulmonary vascular prominence.   Electronically Signed   By: Rozetta Nunnery M.D.   On: 05/09/2014 18:04   Dg Chest Port 1 View  05/09/2014   CLINICAL DATA:  68 year old female with shortness of breath and fever.  EXAM: PORTABLE CHEST - 1 VIEW  COMPARISON:  03/03/2014  FINDINGS: Upper limits normal heart size again noted.  Diffuse peribronchial thickening is noted and increased.  No definite airspace disease, pleural effusion or pneumothorax noted.  No acute bony abnormalities are identified.  IMPRESSION: Increasing diffuse peribronchial thickening/bronchitis. No definite focal airspace opacity on this single view.   Electronically Signed   By: Hassan Rowan M.D.   On: 05/09/2014 11:34   Ct Extrem Lower W Cm Bil  05/09/2014   CLINICAL DATA:  Fever. Soft tissue wound in the left thigh with cellulitis.  EXAM: CT OF  THE LOWER BILATERAL EXTREMITY WITH CONTRAST  TECHNIQUE: Multidetector CT imaging of the lower bilateral extremities was performed according to the standard protocol following intravenous contrast administration.  COMPARISON:  None.  CONTRAST:  146mL OMNIPAQUE IOHEXOL 300 MG/ML  SOLN  FINDINGS: There is extensive subcutaneous edema  throughout the left lower extremity. No acute abnormality of the bones. 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 No osteomyelitis. No joint effusions.  There is no evidence of a soft tissue abscess. There is similar but less extensive subcutaneous edema in the medial aspect of the right thigh.  There is a focal retracted area in the skin surface of the medial aspect of the left thigh consistent with the wound.  Incidental note is made of multiple small dense nodular areas in the subcutaneous fat of the panniculus consistent with injection granulomas.  IMPRESSION: Extensive subcutaneous edema throughout the left lower extremity with a soft tissue wound in the medial aspect of the left thigh. There is no abscess, osteomyelitis, joint effusion, or myositis.   Electronically Signed   By: Rozetta Nunnery M.D.   On: 05/09/2014 13:43    Scheduled Meds: . allopurinol  100 mg Oral Daily  . atorvastatin  40 mg Oral Daily  . calcium citrate  500 mg of elemental calcium Oral QID  . Chlorhexidine Gluconate Cloth  6 each Topical Q0600  . cholecalciferol  1,000 Units Oral Daily  . docusate sodium  100 mg Oral BID  . enoxaparin  140 mg Subcutaneous Q12H  . furosemide  20 mg Oral Daily  . Influenza vac split quadrivalent PF  0.5 mL Intramuscular Tomorrow-1000  . insulin aspart  0-15 Units Subcutaneous TID WC  . iron polysaccharides  150 mg Oral Daily  . isosorbide mononitrate  10 mg Oral BID  . magnesium oxide  400 mg Oral Daily  . metoprolol tartrate  37.5 mg Oral BID  . mupirocin ointment  1 application Nasal BID  . pantoprazole  80 mg Oral Q1200  . polyethylene glycol  17 g Oral Daily  . senna  1 tablet Oral BID  . sodium chloride  3 mL Intravenous Q12H  . vancomycin  2,000 mg Intravenous Q24H   Continuous Infusions: . sodium chloride 20 mL/hr at 05/10/14 0945  . dextrose 5 % 1,000 mL with sodium bicarbonate 150 mEq infusion 20 mL/hr at  05/10/14 9702    Principal Problem:   Sepsis Active Problems:   Bacteremia   HYPERTENSION   Diabetes mellitus type 2 with neurological manifestations   Obesity hypoventilation syndrome   Morbid obesity   Chronic diastolic heart failure   Ulcer of lower limb, unspecified   Chronic venous insufficiency   Pulmonary embolism   Sinus tachycardia   Chronic kidney disease, stage 3   Diabetic neuropathy   Esophageal reflux   Coronary atherosclerosis of native coronary artery   Cellulitis of left leg: Probable   Hypokalemia   Hypocalcemia   Anemia   Bronchitis: Per CXR   Acidosis   Open thigh wound   DVT (deep venous thrombosis)    Time spent: 40 mins    [facility] MD [facility] Pager [phone]. If 7PM-7AM, please contact night-coverage at [email or web address], password [facility] 05/11/2014, 8:18 AM  LOS: 2 days

## 2014-05-11 NOTE — Progress Notes (Signed)
Clinical Social Work Department BRIEF PSYCHOSOCIAL ASSESSMENT 05/11/2014  Patient:  Kelly [name], Kelly [name]     Account Number:  [id number]     Admit date:  05/09/2014  Clinical Social Worker:  Ulyess Blossom  Date/Time:  05/11/2014 02:00 PM  Referred by:  RN  Date Referred:  05/11/2014 Referred for  SNF Placement   Other Referral:   Interview type:  Patient Other interview type:    PSYCHOSOCIAL DATA Living Status:  FACILITY Admitted from facility:  [facility] Level of care:  [place] Primary support name:  [name] Houston/niece/[id number] Primary support relationship to patient:  FAMILY Degree of support available:   adequate, pt reports adequate support from pt niece    CURRENT CONCERNS Current Concerns  Post-Acute Placement   Other Concerns:    SOCIAL WORK ASSESSMENT / PLAN CSW received referral that pt admitted from [facility]. CSW received phone call from pt RN stating that pt requesting to speak with CSW regarding options for another SNF placement.    CSW met with pt at bedside. CSW introduced self and explained role. Pt stated that she is a resident at [facility]. Pt shared that she has been at [facility] since July, but recently has become not satsified with the care she is receiving at the facility. CSW clarified with pt if she is in the facility for short term or long term placement and pt states that she is just at the facility for short term placement as she has her own home and does not want the facility to take her check. CSW provided support and discussed that CSW can do a [facility] search to explore options for pt. Pt agreeable. Pt states that pt niece will assist pt in making a decision regarding SNF placement.    CSW completed FL2 and initiated SNF search to [place].    CSW to follow up with pt and pt niece regarding bed offers.    CSW to continue ot follow and assist with pt disposition  needs.   Assessment/plan status:  Psychosocial Support/Ongoing Assessment of Needs Other assessment/ plan:   discharge planning   Information/referral to community resources:   [place] SNF list excluding [facility]    PATIENT'S/FAMILY'S RESPONSE TO PLAN OF CARE: Pt alert and oriented x 4. Pt eager to find another option for SNF placement. Pt very hopeful that she will have other options. Pt appreciative of CSW support and assistance with exploring other SNF options.   Alison Murray, MSW, [place] Work [phone]

## 2014-05-11 NOTE — Progress Notes (Signed)
Patient refuses CPAP 

## 2014-05-11 NOTE — Progress Notes (Signed)
PULMONARY  / CRITICAL CARE MEDICINE CONSULTATION   Name: Kelly [name] MRN: [id number] DOB: 02-01-1946    ADMISSION DATE:  05/09/2014 CONSULTATION DATE: 05/09/14  REQUESTING CLINICIAN: Dr. Grandville Silos PRIMARY SERVICE: [facility]  CHIEF COMPLAINT:  Sepsis  BRIEF PATIENT DESCRIPTION: Kelly [name] with morbid obesity, diastolic CHF, HTN, DM and chronic left leg ulcer, DVT presents with cc of AMS, fevers, found to be septic likely 2/2 leg source.     SIGNIFICANT EVENTS / STUDIES:  05/09/14:  Admission 05/09/14:  Hg on admit was 4.9  LINES / TUBES: RIJ TLC:   05/09/14-->  CULTURES: 05/09/14:  Blood cx >> GPC chains 05/09/14  Urine  >>   ANTIBIOTICS: Vanc: 05/09/14-->   Aztreonam: 05/09/14-->   Levaquin: 05/09/14-->    SUBJECTIVE:  CT LLE performed as above > no evidence tracking infxn or gas Positive blood cx C/o LLE pain, no other issues  VITAL SIGNS: Temp:  [98.4 F (36.9 C)-100.8 F (38.2 C)] 99.1 F (37.3 C) (09/15 0700) Pulse Rate:  [73-88] 76 (09/15 0700) Resp:  [12-23] 13 (09/15 0700) BP: (95-183)/(34-111) 181/58 mmHg (09/15 0700) SpO2:  [100 %] 100 % (09/15 0700) Weight:  [148 kg (326 lb 4.5 oz)] 148 kg (326 lb 4.5 oz) (09/15 0400) HEMODYNAMICS:   VENTILATOR SETTINGS:   INTAKE / OUTPUT: Intake/Output     09/14 0701 - 09/15 0700 09/15 0701 - 09/16 0700   P.O. 870    I.V. (mL/kg) 787.8 (5.3)    Blood     Other 40    IV Piggyback 500    Total Intake(mL/kg) 2197.8 (14.8)    Urine (mL/kg/hr) 1530 (0.4)    Total Output 1530     Net +667.8          Stool Occurrence 1 x      PHYSICAL EXAMINATION: General:  Obese, NAD Neuro:  Nonfocal, PERRL HEENT:  Supple, unable to appreciate JVD 2/2 habitus Cardiovascular:  Slightly tachy but regular, no murmurs Lungs:  CTAB with good air movement, no wheezes or crackles Abdomen:  +BS, Soft Musculoskeletal:  MAEW Skin:  Large L inner thigh wound, bandage c/d/i, significant LLE swelling, some  warmth  LABS:  CBC  Recent Labs Lab 05/09/14 1005 05/09/14 2200 05/10/14 0450 05/11/14 0515  WBC 6.3  --  12.2* 11.1*  HGB 4.9* 10.7* 9.7* 9.4*  HCT 15.3* 32.2* 29.3* 28.2*  PLT 105*  --  200 184   Coag's  Recent Labs Lab 05/09/14 1700 05/10/14 0450  INR 1.28 1.58*   BMET  Recent Labs Lab 05/10/14 0450 05/10/14 1250 05/11/14 0515  NA 139 134* 136*  K 5.6* 5.2 4.7  CL 106 102 103  CO2 22 23 24   BUN 18 19 18   CREATININE 1.25* 1.23* 1.21*  GLUCOSE 168* 184* 119*   Electrolytes  Recent Labs Lab 05/09/14 1004  05/10/14 0450 05/10/14 1250 05/11/14 0515  CALCIUM  --   < > 8.1* 8.4 8.4  MG 0.7*  --  2.2  --  1.9  PHOS 1.1*  --   --   --  3.0  < > = values in this interval not displayed. Sepsis Markers  Recent Labs Lab 05/09/14 1004 05/09/14 1127 05/10/14 0450 05/10/14 1250 05/11/14 0515  LATICACIDVEN  --  1.10  --  1.6  --   PROCALCITON 0.85  --  4.75  --  3.94   ABG No results found for this basename: PHART, PCO2ART, PO2ART,  in the last 168 hours Liver Enzymes  Recent Labs Lab 05/09/14 1005 05/10/14 0450  AST 6 9  ALT 5 7  ALKPHOS 54 88  BILITOT <0.2* 0.4  ALBUMIN 1.1* 1.9*   Cardiac Enzymes No results found for this basename: TROPONINI, PROBNP,  in the last 168 hours Glucose  Recent Labs Lab 05/09/14 2215 05/10/14 0841 05/10/14 1135 05/10/14 1726 05/10/14 2103 05/11/14 0840  GLUCAP 201* 171* 179* 163* 152* 123*    Imaging Dg Chest Port 1 View  05/09/2014   CLINICAL DATA:  Central line placement.  EXAM: PORTABLE CHEST - 1 VIEW 5:22 p.m.  COMPARISON:  05/09/2014 at 9:54 a.m.  FINDINGS: Right jugular vein catheter has been inserted and the tip is in the superior vena cava above the right atrium in good position. Heart size is within normal limits. Slight pulmonary vascular prominence. Peribronchial thickening as noted previously. No infiltrates or effusions.  IMPRESSION: Central line in good position. No pneumothorax. Persistent  peribronchial thickening and slight pulmonary vascular prominence.   Electronically Signed   By: Rozetta Nunnery M.D.   On: 05/09/2014 18:04   Dg Chest Port 1 View  05/09/2014   CLINICAL DATA:  68 year old female with shortness of breath and fever.  EXAM: PORTABLE CHEST - 1 VIEW  COMPARISON:  03/03/2014  FINDINGS: Upper limits normal heart size again noted.  Diffuse peribronchial thickening is noted and increased.  No definite airspace disease, pleural effusion or pneumothorax noted.  No acute bony abnormalities are identified.  IMPRESSION: Increasing diffuse peribronchial thickening/bronchitis. No definite focal airspace opacity on this single view.   Electronically Signed   By: Hassan Rowan M.D.   On: 05/09/2014 11:34   Ct Extrem Lower W Cm Bil  05/09/2014   CLINICAL DATA:  Fever. Soft tissue wound in the left thigh with cellulitis.  EXAM: CT OF THE LOWER BILATERAL EXTREMITY WITH CONTRAST  TECHNIQUE: Multidetector CT imaging of the lower bilateral extremities was performed according to the standard protocol following intravenous contrast administration.  COMPARISON:  None.  CONTRAST:  1102mL OMNIPAQUE IOHEXOL 300 MG/ML  SOLN  FINDINGS: There is extensive subcutaneous edema throughout the left lower extremity. No acute abnormality of the bones. 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 No osteomyelitis. No joint effusions.  There is no evidence of a soft tissue abscess. There is similar but less extensive subcutaneous edema in the medial aspect of the right thigh.  There is a focal retracted area in the skin surface of the medial aspect of the left thigh consistent with the wound.  Incidental note is made of multiple small dense nodular areas in the subcutaneous fat of the panniculus consistent with injection granulomas.  IMPRESSION: Extensive subcutaneous edema throughout the left lower extremity with a soft tissue wound in the medial aspect of the  left thigh. There is no abscess, osteomyelitis, joint effusion, or myositis.   Electronically Signed   By: Rozetta Nunnery M.D.   On: 05/09/2014 13:43    EKG: Sinus tach with rate of about 100, no ST changes CXR: Increase vascular prominence, no focal infiltates  ASSESSMENT / PLAN:  PULMONARY A: No acute resp issues or complaints.  Stable on RA.  Suspicion for PNA low.  Pt does have h/o PE in late June.     P:   O2 prn Agree with treatment dose lovenox for PE  CARDIOVASCULAR A: Chronic diastolic CHF HTN  P:   Agree with continuing home meds Would aim for euvolemia  for now and use additional PRN lasix to achieve this  RENAL A: Hyperkalemia Metabolic acidosis Acute renal failure, non-oliguric P:   Potassium initially overcorrected > improved Follow BMET with hydration Off bicarb 9/14pm  GASTROINTESTINAL A: No signs of GIB  P:   Modified carb diet Monitor for signs of possible GIB  HEMATOLOGIC A: Pt anemic with Hg of 4.9.  Now s/p 2 units PRBC's.  No clear sign of bleed.  Question whether this is spurious value as recent Hg was 9.0  P:   Follow CBC post PRBC Would check iron studies Consider hemolysis labs if Hgb drops again  INFECTIOUS A: Sepsis (without shock) GPC bacteremia, likely source is LLE wound; CT LLE reassuring  P:   Appreciate Dr Hale Bogus assistance  D/c'd  Levaquin and aztreonam Continue vanco Plastics and wound care consults  May need to consider TTE > she is at risk for endocarditis   ENDOCRINE A: DM  P:   Agree with SSI  NEUROLOGIC A: No acute issues  P:   Monitor for changes  TODAY'S SUMMARY: Kelly [name] with morbid obesity, diastolic CHF, HTN, DM and chronic left leg ulcer, DVT presents with sepsis and ARF due to [place] bacteremia  I have personally obtained a history, examined the patient, evaluated laboratory and imaging results, formulated the assessment and plan and placed orders.  PCCM will sign off. Please call if we can  assist you   Baltazar Apo, MD, PhD 05/11/2014, 9:19 AM [facility] [phone] or if no answer [phone]

## 2014-05-11 NOTE — Progress Notes (Signed)
*  Preliminary Results* Bilateral lower extremity venous duplex completed. Study was very technically limited due to patient body habitus and depth of vessels. Visualized veins of bilateral lower extremities are negative for deep vein thrombosis. There is no evidence of Baker's cyst bilaterally. Findings the same as the venous duplex from 04/09/2014, which is available for comparison in [facility].  05/11/2014  Maudry Mayhew, RVT, RDCS, RDMS

## 2014-05-12 DIAGNOSIS — A409 Streptococcal sepsis, unspecified: Principal | ICD-10-CM

## 2014-05-12 DIAGNOSIS — I82409 Acute embolism and thrombosis of unspecified deep veins of unspecified lower extremity: Secondary | ICD-10-CM

## 2014-05-12 LAB — CULTURE, BLOOD (ROUTINE X 2)

## 2014-05-12 LAB — CBC WITH DIFFERENTIAL/PLATELET
BASOS PCT: 0 % (ref 0–1)
Basophils Absolute: 0 10*3/uL (ref 0.0–0.1)
EOS ABS: 0.4 10*3/uL (ref 0.0–0.7)
Eosinophils Relative: 5 % (ref 0–5)
HEMATOCRIT: 28.9 % — AB (ref 36.0–46.0)
HEMOGLOBIN: 9.6 g/dL — AB (ref 12.0–15.0)
Lymphocytes Relative: 13 % (ref 12–46)
Lymphs Abs: 1.2 10*3/uL (ref 0.7–4.0)
MCH: 29.6 pg (ref 26.0–34.0)
MCHC: 33.2 g/dL (ref 30.0–36.0)
MCV: 89.2 fL (ref 78.0–100.0)
MONO ABS: 0.7 10*3/uL (ref 0.1–1.0)
MONOS PCT: 8 % (ref 3–12)
Neutro Abs: 6.6 10*3/uL (ref 1.7–7.7)
Neutrophils Relative %: 74 % (ref 43–77)
Platelets: 184 10*3/uL (ref 150–400)
RBC: 3.24 MIL/uL — ABNORMAL LOW (ref 3.87–5.11)
RDW: 14.9 % (ref 11.5–15.5)
WBC: 8.9 10*3/uL (ref 4.0–10.5)

## 2014-05-12 LAB — BASIC METABOLIC PANEL
Anion gap: 13 (ref 5–15)
BUN: 16 mg/dL (ref 6–23)
CHLORIDE: 101 meq/L (ref 96–112)
CO2: 21 meq/L (ref 19–32)
CREATININE: 1.01 mg/dL (ref 0.50–1.10)
Calcium: 8.6 mg/dL (ref 8.4–10.5)
GFR calc Af Amer: 65 mL/min — ABNORMAL LOW (ref >90)
GFR calc non Af Amer: 56 mL/min — ABNORMAL LOW (ref >90)
GLUCOSE: 108 mg/dL — AB (ref 70–99)
Potassium: 4.2 mEq/L (ref 3.7–5.3)
Sodium: 135 mEq/L — ABNORMAL LOW (ref 137–147)

## 2014-05-12 LAB — GLUCOSE, CAPILLARY
GLUCOSE-CAPILLARY: 173 mg/dL — AB (ref 70–99)
GLUCOSE-CAPILLARY: 175 mg/dL — AB (ref 70–99)
Glucose-Capillary: 134 mg/dL — ABNORMAL HIGH (ref 70–99)
Glucose-Capillary: 143 mg/dL — ABNORMAL HIGH (ref 70–99)

## 2014-05-12 LAB — VITAMIN D 1,25 DIHYDROXY
Vitamin D 1, 25 (OH)2 Total: 26 pg/mL (ref 18–72)
Vitamin D2 1, 25 (OH)2: 26 pg/mL
Vitamin D3 1, 25 (OH)2: <8 pg/mL

## 2014-05-12 MED ORDER — LEVOFLOXACIN 500 MG PO TABS
500.0000 mg | ORAL_TABLET | Freq: Every day | ORAL | Status: DC
  Administered 2014-05-12 – 2014-05-14 (×3): 500 mg via ORAL
  Filled 2014-05-12 (×3): qty 1

## 2014-05-12 NOTE — Progress Notes (Signed)
CSW continuing to follow for disposition planning.  CSW met with pt at bedside and shared with pt that options at this time for placement are return to [facility] or pt received bed offers from [place], [place], and [place] if pt wanted to consider on of these options to transition to a different facility. Pt wishes to speak with pt niece regarding options before making a decision.   CSW contacted pt niece via telephone with pt permission. CSW shared disposition options with pt niece. Pt niece discussed that she plans to tour other facilities that offered a bed to assist pt in making a decision. Support provided.  CSW to continue to follow to provide support and assist with pt disposition needs.   Alison Murray, MSW, [name] Work [phone]

## 2014-05-12 NOTE — Progress Notes (Signed)
Physical Therapy Treatment Patient Details Name: Kelly [name] MRN: [id number] DOB: 08/04/1946 Today's Date: 05/12/2014    History of Present Illness 68 year old female admitted 05/09/14 with fever and LLE pain.Patient has a  history of chronic diastolic heart failure, hypertension, hyperlipidemia, diabetes mellitus type 2, obstructive sleep apnea, GERD, depression, chronic lymphedema, and indwelling Foley catheter. Recently had I/D  L thigh wound  in 6/15 with multiple complications  including PE. Patient 's leg wound being managed by Wound center.    PT Comments    Patient made several attempts to stand with 3 persons asisting but unable to clear bed. Unable to use a  Lift with sling due to location of L thigh wound. Patient expresses  Being upset that she cannot stand up. Encouraged patient   To continue efforts .  Follow Up Recommendations  SNF     Equipment Recommendations  None recommended by PT    Recommendations for Other Services       Precautions / Restrictions Precautions Precautions: Fall Precaution Comments: L thigh wound,morbid obesity    Mobility  Bed Mobility Overal bed mobility: Needs Assistance;+2 for physical assistance;+ 2 for safety/equipment Bed Mobility: Rolling;Sit to Supine Rolling: Min assist   Supine to sit: Max assist;+2 for physical assistance;+2 for safety/equipment;HOB elevated Sit to supine: Max assist;+2 for physical assistance;+2 for safety/equipment   General bed mobility comments: use of Rail and bed pad to  turn patient, assist for legs to edge of bed . Use of bed pad to get patient back into bed and assist with both legs lifted back onto bed.  Transfers Overall transfer level: Needs assistance Equipment used: Rolling walker (2 wheeled) Transfers: Sit to/from Stand           General transfer comment: + 3 available , pt made 4 attempts to stand from bed raised, concern for bed to be too high and patient sliding off.Patient unable to  clear bed.  Ambulation/Gait                 Stairs            Wheelchair Mobility    Modified Rankin (Stroke Patients Only)       Balance   Sitting-balance support: Bilateral upper extremity supported;Feet supported Sitting balance-Leahy Scale: Fair                              Cognition Arousal/Alertness: Awake/alert                          Exercises      General Comments        Pertinent Vitals/Pain Pain Score: 6  Pain Location: L thigh Pain Descriptors / Indicators: Sharp;Constant Pain Intervention(s): Monitored during session;Repositioned    Home Living                      Prior Function            PT Goals (current goals can now be found in the care plan section) Progress towards PT goals: Progressing toward goals    Frequency  Min 3X/week    PT Plan Current plan remains appropriate    Co-evaluation             End of Session   Activity Tolerance: Patient limited by fatigue Patient left: in bed;with call bell/phone within reach     Time: 8546-2703  PT Time Calculation (min): 63 min  Charges:  $Therapeutic Activity: 23-37 mins $Self Care/Home Management: 8-22                    G Codes:      Claretha Cooper 05/12/2014, 5:25 PM Tresa Endo PT [phone]

## 2014-05-12 NOTE — Progress Notes (Signed)
Chaplain consult for support.  Introduced service, provided emotional and spiritual support with pt at bedside.  Pt has not been home since June 15 and is missing day to day activities.  Shared prayers with pt at request.  Will continue to follow for support.    [place], [place]

## 2014-05-12 NOTE — Progress Notes (Signed)
PROGRESS NOTE  Kelly [name] DPO:[id number] DOB: 1945/09/16 DOA: 05/09/2014 PCP: Alvester Chou, NP  Assessment/Plan: #1 sepsis  -secondary to bacteremia and left lower extremity cellulitis.  -Urinalysis was negative.  -Chest x-ray negative for acute infiltrate. Urinalysis was negative.  -pro-calcitonin level of 3.94 from 4.75. Blood cultures growing 2 out of 2 gram-positive cocci in chains. Continue empiric IV vancomycin. Continue supportive care. Follow.  #2 bacteremia--group C Streptococcus  Preliminary wound cultures growing gram-positive cocci in chains. Likely seeded from left thigh wound or fungal dermatitis noted on foot. Continue empiric IV vancomycin. ID has assessed patient and aztreonam d/c'd. ID ff and appreciate input and rxcs. -surveillance blood cultures  #3 left lower extremity cellulitis with open left thigh wound  -left thigh wound with good granulation tissue.  -likely the portal of entry for the patient's bacteremia -plan to narrow spectrum of abx--defer to ID -appreciate ID -Patient has also been seen by plastics, Dr Migdalia Dk. Continue wound care.  #4 hypokalemia  Potassium has been repleted.  #5 hypocalcemia/hypomagnesemia  -Likely secondary to VIT D deficiency.  -Corrected calcium is 9.8. Ionized calcium was 1.18. -Patient's magnesium on admission was 0.7 which has been repleted  -Phosphorus level is 3.0. PTH elevated at 161.  -Vitamin D 25- hydroxy levels low at 19. Will continue oral calcium supplementation and VIT D. Outpatient follow up.  #6 anemia  -On admission patient's hemoglobin was noted to be 4.9. Patient is status post 2 units packed red blood cells and hemoglobin currently of 9.4.  -Initial labs may have been spurious.  -Patient with no overt bleeding.  -Anemia panel consistent with anemia of chronic disease/iron deficiency anemia. Continue iron supplementation. -Follow H&H.  #7 metabolic acidosis  -May be secondary to problems #1 and 2.  Acidosis has improved on bicarbonate drip.  -D/C bicarbonate drip.  #8 type 2 diabetes  -Hemoglobin A1c--6.5  -CBGs controlled -Continue sliding scale insulin.  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 Continue full dose Lovenox.  -switch to oral therapy if no future surgery is planned #10 sinus tachycardia  Likely secondary to problems #1,2, 3, 6.  -Heart rate improved.  -Patient is status post 2 units packed red blood cells #43 chronic diastolic CHF/coronary artery disease  -Stable.  -Continue home regimen of beta blocker, Lasix, Imdur.  #12 hypertension  -Stable.  -Continue Lopressor,lasix and Imdur.  #13 gastroesophageal reflux disease  PPI.  #14 chronic kidney disease stage III  Stable.  Family Communication:   Pt at beside Disposition Plan:   SNFwhen medically stable   Antibiotics:  Vancomycin 05/09/14>>>     Procedures/Studies: Dg Chest Port 1 View  05/09/2014   CLINICAL DATA:  Central line placement.  EXAM: PORTABLE CHEST - 1 VIEW 5:22 p.m.  COMPARISON:  05/09/2014 at 9:54 a.m.  FINDINGS: Right jugular vein catheter has been inserted and the tip is in the superior vena cava above the right atrium in good position. Heart size is within normal limits. Slight pulmonary vascular prominence. Peribronchial thickening as noted previously. No infiltrates or effusions.  IMPRESSION: Central line in good position. No pneumothorax. Persistent peribronchial thickening and slight pulmonary vascular prominence.   Electronically Signed   By: Rozetta Nunnery M.D.   On: 05/09/2014 18:04   Dg Chest Port 1 View  05/09/2014   CLINICAL DATA:  68 year old female with shortness of breath and fever.  EXAM: PORTABLE CHEST - 1 VIEW  COMPARISON:  03/03/2014  FINDINGS: Upper limits normal heart size again noted.  Diffuse peribronchial thickening is noted and increased.  No definite airspace disease, pleural effusion or pneumothorax noted.  No acute bony abnormalities  are identified.  IMPRESSION: Increasing diffuse peribronchial thickening/bronchitis. No definite focal airspace opacity on this single view.   Electronically Signed   By: Hassan Rowan M.D.   On: 05/09/2014 11:34   Dg Foot 2 Views Left  04/14/2014   CLINICAL DATA:  Possible great toe osteomyelitis.  EXAM: LEFT FOOT - 2 VIEW  COMPARISON:  None.  FINDINGS: There is no evidence of fracture or dislocation. Vascular calcifications are noted. No definite lytic destruction is seen to suggest osteomyelitis. Spurring of posterior calcaneus is noted. Swelling of dorsal soft tissues is noted suggesting cellulitis.  IMPRESSION: Swelling of dorsal soft tissues is seen suggesting cellulitis. No lytic destruction is seen to suggest osteomyelitis. However, MRI would be more sensitive for the detection of osteomyelitis.   Electronically Signed   By: Sabino Dick M.D.   On: 04/14/2014 15:29   Ct Extrem Lower W Cm Bil  05/09/2014   CLINICAL DATA:  Fever. Soft tissue wound in the left thigh with cellulitis.  EXAM: CT OF THE LOWER BILATERAL EXTREMITY WITH CONTRAST  TECHNIQUE: Multidetector CT imaging of the lower bilateral extremities was performed according to the standard protocol following intravenous contrast administration.  COMPARISON:  None.  CONTRAST:  174mL OMNIPAQUE IOHEXOL 300 MG/ML  SOLN  FINDINGS: There is extensive subcutaneous edema throughout the left lower extremity. No acute abnormality of the bones. 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 No osteomyelitis. No joint effusions.  There is no evidence of a soft tissue abscess. There is similar but less extensive subcutaneous edema in the medial aspect of the right thigh.  There is a focal retracted area in the skin surface of the medial aspect of the left thigh consistent with the wound.  Incidental note is made of multiple small dense nodular areas in the subcutaneous fat of the panniculus consistent  with injection granulomas.  IMPRESSION: Extensive subcutaneous edema throughout the left lower extremity with a soft tissue wound in the medial aspect of the left thigh. There is no abscess, osteomyelitis, joint effusion, or myositis.   Electronically Signed   By: Rozetta Nunnery M.D.   On: 05/09/2014 13:43         Subjective: Patient complains of intermittent shortness of breath but currently denies any fevers, chills, chest pain, shortness breath, nausea, vomiting, diarrhea, abdominal pain. Like pain is gradually improving.  Objective: Filed Vitals:   05/12/14 0800 05/12/14 1000 05/12/14 1200 05/12/14 1300  BP:  186/64 201/67   Pulse:  75 72 77  Temp: 98.3 F (36.8 C) 99 F (37.2 C) 99.3 F (37.4 C) 99.3 F (37.4 C)  TempSrc: Oral     Resp:  14 18 16   Height:      Weight:      SpO2:  98% 98% 98%    Intake/Output Summary (Last 24 hours) at 05/12/14 1403 Last data filed at 05/12/14 1300  Gross per 24 hour  Intake   1600 ml  Output   3050 ml  Net  -1450 ml   Weight change: -16 kg (-35 lb 4.4 oz) Exam:   General:  Pt is alert, follows commands appropriately, not in acute distress  HEENT: No icterus, No thrush,  [place]/AT  Cardiovascular: RRR, S1/S2, no rubs, no gallops  Respiratory: CTA bilaterally, no wheezing, no crackles, no rhonchi  Abdomen: Soft/+BS, non tender, non distended, no guarding  Extremities: 3+LE edema, No lymphangitis, No petechiae, No rashes, no synovitis;  L-leg wound with good granulation  Data Reviewed: Basic Metabolic Panel:  Recent Labs Lab 05/09/14 1004  05/09/14 2200 05/10/14 0450 05/10/14 1250 05/11/14 0515 05/12/14 0620  NA  --   < > 136* 139 134* 136* 135*  K  --   < > 5.0 5.6* 5.2 4.7 4.2  CL  --   < > 103 106 102 103 101  CO2  --   < > 21 22 23 24 21   GLUCOSE  --   < > 184* 168* 184* 119* 108*  BUN  --   < > 17 18 19 18 16   CREATININE  --   < > 1.13* 1.25* 1.23* 1.21* 1.01  CALCIUM  --   < > 8.3* 8.1* 8.4 8.4 8.6  MG 0.7*  --    --  2.2  --  1.9  --   PHOS 1.1*  --   --   --   --  3.0  --   < > = values in this interval not displayed. Liver Function Tests:  Recent Labs Lab 05/09/14 1005 05/10/14 0450  AST 6 9  ALT 5 7  ALKPHOS 54 88  BILITOT <0.2* 0.4  PROT 3.7* 6.4  ALBUMIN 1.1* 1.9*   No results found for this basename: LIPASE, AMYLASE,  in the last 168 hours No results found for this basename: AMMONIA,  in the last 168 hours CBC:  Recent Labs Lab 05/09/14 1005 05/09/14 2200 05/10/14 0450 05/11/14 0515 05/12/14 0620  WBC 6.3  --  12.2* 11.1* 8.9  NEUTROABS 5.4  --   --   --  6.6  HGB 4.9* 10.7* 9.7* 9.4* 9.6*  HCT 15.3* 32.2* 29.3* 28.2* 28.9*  MCV 92.2  --  88.5 88.1 89.2  PLT 105*  --  200 184 184   Cardiac Enzymes: No results found for this basename: CKTOTAL, CKMB, CKMBINDEX, TROPONINI,  in the last 168 hours BNP: No components found with this basename: POCBNP,  CBG:  Recent Labs Lab 05/11/14 1321 05/11/14 1727 05/11/14 2127 05/12/14 0811 05/12/14 1221  GLUCAP 151* 139* 148* 134* 173*    Recent Results (from the past 240 hour(s))  CULTURE, BLOOD (ROUTINE X 2)     Status: None   Collection Time    05/09/14 10:06 AM      Result Value Ref Range Status   Specimen Description BLOOD LEFT FOREARM   Final   Special Requests BOTTLES DRAWN AEROBIC AND ANAEROBIC 3CC   Final   Culture  Setup Time     Final   Value: 05/09/2014 15:16     Performed at [facility]   Culture     Final   Value: STREPTOCOCCUS GROUP C     Note: SUSCEPTIBILITIES PERFORMED ON PREVIOUS CULTURE WITHIN THE LAST 5 DAYS.     Note: Gram Stain Report Called to,Read Back By and Verified With: Gladys Damme RN on 05/10/14 at 04:03 by Rise Mu     Performed at [facility]   Report Status 05/12/2014 FINAL   Final  CULTURE, BLOOD (ROUTINE X 2)     Status: None   Collection Time    05/09/14 10:16 AM      Result Value Ref Range Status   Specimen Description BLOOD RIGHT ANTECUBITAL   Final  Special  Requests BOTTLES DRAWN AEROBIC AND ANAEROBIC 3CC   Final   Culture  Setup Time     Final   Value: 05/09/2014 15:16     Performed at [facility]   Culture     Final   Value: STREPTOCOCCUS GROUP C     Note: Gram Stain Report Called to,Read Back By and Verified With: ANGIE WELCH@1 :51PM ON 05/10/14 BY COOKV     Performed at [facility]   Report Status 05/12/2014 FINAL   Final   Organism ID, Bacteria STREPTOCOCCUS GROUP C   Final  URINE CULTURE     Status: None   Collection Time    05/09/14 10:29 AM      Result Value Ref Range Status   Specimen Description URINE, CATHETERIZED   Final   Special Requests NONE   Final   Culture  Setup Time     Final   Value: 05/09/2014 19:58     Performed at [facility]     Final   Value: 10,000 COLONIES/ML     Performed at [facility]   Culture     Final   Value: PROTEUS MIRABILIS     Performed at [facility]   Report Status PENDING   Incomplete  MRSA PCR SCREENING     Status: Abnormal   Collection Time    05/09/14  3:52 PM      Result Value Ref Range Status   MRSA by PCR POSITIVE (*) NEGATIVE Final   Comment:            The GeneXpert MRSA Assay (FDA     approved for NASAL specimens     only), is one component of a     comprehensive MRSA colonization     surveillance program. It is not     intended to diagnose MRSA     infection nor to guide or     monitor treatment for     MRSA infections.     RESULT CALLED TO, READ BACK BY AND VERIFIED WITH:     S.Muradyan AT 1725 ON 13SEP15 BY C.BONGEL     Scheduled Meds: . allopurinol  100 mg Oral Daily  . atorvastatin  40 mg Oral Daily  . calcium citrate  500 mg of elemental calcium Oral QID  . Chlorhexidine Gluconate Cloth  6 each Topical Q0600  . cholecalciferol  1,000 Units Oral Daily  . docusate sodium  100 mg Oral BID  . enoxaparin  140 mg Subcutaneous Q12H  . furosemide  20 mg Oral Daily  . Influenza vac split quadrivalent PF  0.5 mL  Intramuscular Tomorrow-1000  . insulin aspart  0-15 Units Subcutaneous TID WC  . iron polysaccharides  150 mg Oral Daily  . isosorbide mononitrate  10 mg Oral BID  . magnesium oxide  400 mg Oral Daily  . metoprolol tartrate  37.5 mg Oral BID  . mupirocin ointment  1 application Nasal BID  . pantoprazole  80 mg Oral Q1200  . polyethylene glycol  17 g Oral Daily  . senna  1 tablet Oral BID  . sodium chloride  3 mL Intravenous Q12H  . vancomycin  2,000 mg Intravenous Q24H   Continuous Infusions: . sodium chloride 20 mL/hr at 05/10/14 0945     Baljit Liebert, DO  [facility] Pager [phone]  If 7PM-7AM, please contact night-coverage [email or web address] Password [email or web address] 05/12/2014, 2:03 PM   LOS: 3 days

## 2014-05-12 NOTE — Progress Notes (Signed)
Pt has b/p of >620 systolic. Called midlevel awaiting call back.

## 2014-05-12 NOTE — Progress Notes (Signed)
Pt refuses to wear CPAP by nasal mask or full face mask.  Pt states that she no longer wears CPAP at night and "hasn't in years."  States that she told the MD the same thing and that she didn't want one.  Pt has been instructed to request the RT should she feel like she needs the CPAP at any point during the night.

## 2014-05-12 NOTE — Progress Notes (Signed)
Patient ID: Kelly [name], female   DOB: [birth year], 68 y.o.   MRN: [id number]         [facility]    Date of Admission:  05/09/2014           Day 4 vancomycin  Principal Problem:   Sepsis Active Problems:   Cellulitis of left leg: Probable   Bacteremia   HYPERTENSION   Diabetes mellitus type 2 with neurological manifestations   Obesity hypoventilation syndrome   Morbid obesity   Chronic diastolic heart failure   Ulcer of lower limb, unspecified   Chronic venous insufficiency   Pulmonary embolism   Sinus tachycardia   Chronic kidney disease, stage 3   Diabetic neuropathy   Esophageal reflux   Coronary atherosclerosis of native coronary artery   Hypokalemia   Hypocalcemia   Anemia   Bronchitis: Per CXR   Acidosis   Open thigh wound   DVT (deep venous thrombosis)   . allopurinol  100 mg Oral Daily  . atorvastatin  40 mg Oral Daily  . calcium citrate  500 mg of elemental calcium Oral QID  . Chlorhexidine Gluconate Cloth  6 each Topical Q0600  . cholecalciferol  1,000 Units Oral Daily  . docusate sodium  100 mg Oral BID  . enoxaparin  140 mg Subcutaneous Q12H  . furosemide  20 mg Oral Daily  . Influenza vac split quadrivalent PF  0.5 mL Intramuscular Tomorrow-1000  . insulin aspart  0-15 Units Subcutaneous TID WC  . iron polysaccharides  150 mg Oral Daily  . isosorbide mononitrate  10 mg Oral BID  . magnesium oxide  400 mg Oral Daily  . metoprolol tartrate  37.5 mg Oral BID  . mupirocin ointment  1 application Nasal BID  . pantoprazole  80 mg Oral Q1200  . polyethylene glycol  17 g Oral Daily  . senna  1 tablet Oral BID  . sodium chloride  3 mL Intravenous Q12H  . vancomycin  2,000 mg Intravenous Q24H    Subjective: She is feeling better with less pain.  Objective: Temp:  [98.1 F (36.7 C)-99.7 F (37.6 C)] 99.5 F (37.5 C) (09/16 1400) Pulse Rate:  [71-81] 81 (09/16 1400) Resp:  [13-20] 15 (09/16 1400) BP: (154-222)/(47-80)  175/75 mmHg (09/16 1400) SpO2:  [91 %-100 %] 97 % (09/16 1400) Weight:  [291 lb 0.1 oz (132 kg)] 291 lb 0.1 oz (132 kg) (09/16 0500)  General: She is alert and comfortable in bed Left leg: Her left lower leg remained slightly warm to touch but the erythema has resolved and the swelling is decreasing.  Lab Results Lab Results  Component Value Date   WBC 8.9 05/12/2014   HGB 9.6* 05/12/2014   HCT 28.9* 05/12/2014   MCV 89.2 05/12/2014   PLT 184 05/12/2014    Lab Results  Component Value Date   CREATININE 1.01 05/12/2014   BUN 16 05/12/2014   NA 135* 05/12/2014   K 4.2 05/12/2014   CL 101 05/12/2014   CO2 21 05/12/2014    Lab Results  Component Value Date   ALT 7 05/10/2014   AST 9 05/10/2014   ALKPHOS 88 05/10/2014   BILITOT 0.4 05/10/2014      Microbiology: Recent Results (from the past 240 hour(s))  CULTURE, BLOOD (ROUTINE X 2)     Status: None   Collection Time    05/09/14 10:06 AM      Result Value Ref Range Status   Specimen  Description BLOOD LEFT FOREARM   Final   Special Requests BOTTLES DRAWN AEROBIC AND ANAEROBIC 3CC   Final   Culture  Setup Time     Final   Value: 05/09/2014 15:16     Performed at [facility]   Culture     Final   Value: STREPTOCOCCUS GROUP C     Note: SUSCEPTIBILITIES PERFORMED ON PREVIOUS CULTURE WITHIN THE LAST 5 DAYS.     Note: Gram Stain Report Called to,Read Back By and Verified With: Gladys Damme RN on 05/10/14 at 04:03 by Rise Mu     Performed at [facility]   Report Status 05/12/2014 FINAL   Final  CULTURE, BLOOD (ROUTINE X 2)     Status: None   Collection Time    05/09/14 10:16 AM      Result Value Ref Range Status   Specimen Description BLOOD RIGHT ANTECUBITAL   Final   Special Requests BOTTLES DRAWN AEROBIC AND ANAEROBIC 3CC   Final   Culture  Setup Time     Final   Value: 05/09/2014 15:16     Performed at [facility]   Culture     Final   Value: STREPTOCOCCUS GROUP C     Note: Gram Stain Report Called  to,Read Back By and Verified With: ANGIE WELCH@1 :51PM ON 05/10/14 BY COOKV     Performed at [facility]   Report Status 05/12/2014 FINAL   Final   Organism ID, Bacteria STREPTOCOCCUS GROUP C   Final  URINE CULTURE     Status: None   Collection Time    05/09/14 10:29 AM      Result Value Ref Range Status   Specimen Description URINE, CATHETERIZED   Final   Special Requests NONE   Final   Culture  Setup Time     Final   Value: 05/09/2014 19:58     Performed at [place]     Final   Value: 10,000 COLONIES/ML     Performed at [facility]   Culture     Final   Value: PROTEUS MIRABILIS     Performed at [facility]   Report Status PENDING   Incomplete  MRSA PCR SCREENING     Status: Abnormal   Collection Time    05/09/14  3:52 PM      Result Value Ref Range Status   MRSA by PCR POSITIVE (*) NEGATIVE Final   Comment:            The GeneXpert MRSA Assay (FDA     approved for NASAL specimens     only), is one component of a     comprehensive MRSA colonization     surveillance program. It is not     intended to diagnose MRSA     infection nor to guide or     monitor treatment for     MRSA infections.     RESULT CALLED TO, READ BACK BY AND VERIFIED WITH:     S.Mclester AT 1725 ON 13SEP15 BY C.BONGEL    Studies/Results: No results found.  Assessment: She is improving slowly, as expected, on therapy for group C strep cellulitis and bacteremia. She has a remote history of hives with penicillin and does not know if she's ever taken and tolerated cephalosporins. I will complete therapy with 6 more days of oral levofloxacin.  Plan: 1. Change IV vancomycin to oral levofloxacin and treat 6  more days 2. I will sign off now  Michel Bickers, MD [facility] [place] [phone] pager   [phone] cell 05/12/2014, 2:27 PM

## 2014-05-13 DIAGNOSIS — E1129 Type 2 diabetes mellitus with other diabetic kidney complication: Secondary | ICD-10-CM

## 2014-05-13 LAB — BASIC METABOLIC PANEL
Anion gap: 10 (ref 5–15)
BUN: 14 mg/dL (ref 6–23)
CHLORIDE: 103 meq/L (ref 96–112)
CO2: 23 mEq/L (ref 19–32)
Calcium: 8.5 mg/dL (ref 8.4–10.5)
Creatinine, Ser: 0.96 mg/dL (ref 0.50–1.10)
GFR calc non Af Amer: 60 mL/min — ABNORMAL LOW (ref >90)
GFR, EST AFRICAN AMERICAN: 69 mL/min — AB (ref >90)
Glucose, Bld: 109 mg/dL — ABNORMAL HIGH (ref 70–99)
Potassium: 4 mEq/L (ref 3.7–5.3)
Sodium: 136 mEq/L — ABNORMAL LOW (ref 137–147)

## 2014-05-13 LAB — GLUCOSE, CAPILLARY
Glucose-Capillary: 127 mg/dL — ABNORMAL HIGH (ref 70–99)
Glucose-Capillary: 141 mg/dL — ABNORMAL HIGH (ref 70–99)
Glucose-Capillary: 127 mg/dL — ABNORMAL HIGH (ref 70–99)
Glucose-Capillary: 87 mg/dL (ref 70–99)

## 2014-05-13 MED ORDER — METOPROLOL TARTRATE 50 MG PO TABS
50.0000 mg | ORAL_TABLET | Freq: Two times a day (BID) | ORAL | Status: DC
  Administered 2014-05-13 (×2): 50 mg via ORAL
  Filled 2014-05-13 (×4): qty 1

## 2014-05-13 MED ORDER — OXYCODONE HCL 5 MG PO TABS
10.0000 mg | ORAL_TABLET | ORAL | Status: DC | PRN
  Administered 2014-05-14 (×2): 10 mg via ORAL
  Filled 2014-05-13 (×2): qty 2

## 2014-05-13 MED ORDER — SODIUM CHLORIDE 0.9 % IJ SOLN
10.0000 mL | INTRAMUSCULAR | Status: DC | PRN
  Administered 2014-05-14: 20 mL

## 2014-05-13 MED ORDER — APIXABAN 5 MG PO TABS
5.0000 mg | ORAL_TABLET | Freq: Two times a day (BID) | ORAL | Status: DC
  Administered 2014-05-13 – 2014-05-14 (×3): 5 mg via ORAL
  Filled 2014-05-13 (×4): qty 1

## 2014-05-13 NOTE — Care Management Note (Addendum)
    Page 1 of 1   05/14/2014     3:10:02 PM CARE MANAGEMENT NOTE 05/14/2014  Patient:  Kelly [name], Kelly [name]   Account Number:  [id number]  Date Initiated:  05/10/2014  Documentation initiated by:  Gabriel Earing  Subjective/Objective Assessment:   Pt admitted with T104, Hgb 4.9     Action/Plan:   from [facility]   Anticipated DC Date:  05/14/2014   Anticipated DC Plan:  [place]  In-house referral  Clinical Social Worker      DC Planning Services  CM consult      Choice offered to / List presented to:             Status of service:  Completed, signed off Medicare Important Message given?  YES (If response is "NO", the following Medicare IM given date fields will be blank) Date Medicare IM given:  05/13/2014 Medicare IM given by:  [facility] Date Additional Medicare IM given:   Additional Medicare IM given by:    Discharge Disposition:  [place]  Per UR Regulation:  Reviewed for med. necessity/level of care/duration of stay  If discussed at [employer] of Stay Meetings, dates discussed:    Comments:  05/13/14 Contrell Ballentine RN,BSN NCM [phone] PNA.D/C PLAN RETURN SNF.  05/10/14 MMcGibboney, RN, BSN Chart reviewed.

## 2014-05-13 NOTE — Progress Notes (Signed)
Subjective: Patient reports she is feeling better but still having some pain associated with cellulitis of the left lower leg. The left thigh wound is clean, pink granulation.   Objective: Vital signs in last 24 hours: Temp:  [98.2 F (36.8 C)-98.4 F (36.9 C)] 98.4 F (36.9 C) (09/17 1334) Pulse Rate:  [80-86] 86 (09/17 1334) Resp:  [20-23] 23 (09/17 1334) BP: (170-182)/(61-75) 174/68 mmHg (09/17 1334) SpO2:  [93 %-100 %] 98 % (09/17 1334) Weight:  [113 kg (249 lb 1.9 oz)] 113 kg (249 lb 1.9 oz) (09/17 0629) Last BM Date: 05/13/14  Intake/Output from previous day: 09/16 0701 - 09/17 0700 In: 210 [I.V.:210] Out: 2425 [Urine:2425] Intake/Output this shift: Total I/O In: 940 [P.O.:480; I.V.:460] Out: -   General appearance: alert, cooperative and morbidly obese Left thigh wound continues to heal well and has good granulation tissue  Lab Results:   Recent Labs  05/11/14 0515 05/12/14 0620  WBC 11.1* 8.9  HGB 9.4* 9.6*  HCT 28.2* 28.9*  PLT 184 184   BMET  Recent Labs  05/12/14 0620 05/13/14 0525  NA 135* 136*  K 4.2 4.0  CL 101 103  CO2 21 23  GLUCOSE 108* 109*  BUN 16 14  CREATININE 1.01 0.96  CALCIUM 8.6 8.5   PT/INR No results found for this basename: LABPROT, INR,  in the last 72 hours ABG No results found for this basename: PHART, PCO2, PO2, HCO3,  in the last 72 hours  Studies/Results: No results found.  Anti-infectives: Anti-infectives   Start     Dose/Rate Route Frequency Ordered Stop   05/12/14 1600  levofloxacin (LEVAQUIN) tablet 500 mg     500 mg Oral Daily 05/12/14 1430 05/18/14 0959   05/10/14 2200  vancomycin (VANCOCIN) 2,000 mg in sodium chloride 0.9 % 500 mL IVPB  Status:  Discontinued     2,000 mg 250 mL/hr over 120 Minutes Intravenous Every 24 hours 05/10/14 0824 05/12/14 1430   05/10/14 1200  levofloxacin (LEVAQUIN) IVPB 750 mg  Status:  Discontinued     750 mg 100 mL/hr over 90 Minutes Intravenous Every 24 hours 05/09/14  1142 05/10/14 0920   05/09/14 2359  vancomycin (VANCOCIN) 1,500 mg in sodium chloride 0.9 % 500 mL IVPB  Status:  Discontinued     1,500 mg 250 mL/hr over 120 Minutes Intravenous Every 12 hours 05/09/14 1144 05/10/14 0824   05/09/14 2000  aztreonam (AZACTAM) 2 g in dextrose 5 % 50 mL IVPB  Status:  Discontinued     2 g 100 mL/hr over 30 Minutes Intravenous Every 8 hours 05/09/14 1143 05/10/14 1757   05/09/14 1200  vancomycin (VANCOCIN) 2,500 mg in sodium chloride 0.9 % 500 mL IVPB     2,500 mg 250 mL/hr over 120 Minutes Intravenous  Once 05/09/14 1128 05/09/14 1638   05/09/14 1130  levofloxacin (LEVAQUIN) IVPB 750 mg     750 mg 100 mL/hr over 90 Minutes Intravenous  Once 05/09/14 1117 05/09/14 1324   05/09/14 1130  aztreonam (AZACTAM) 2 g in dextrose 5 % 50 mL IVPB     2 g 100 mL/hr over 30 Minutes Intravenous  Once 05/09/14 1117 05/09/14 1426   05/09/14 1130  vancomycin (VANCOCIN) IVPB 1000 mg/200 mL premix  Status:  Discontinued     1,000 mg 200 mL/hr over 60 Minutes Intravenous  Once 05/09/14 1117 05/09/14 1128      Assessment/Plan: s/p * No surgery found * Patient should continue to [facility] Monday mornings with Dr. Migdalia Dk [phone] Continue NS wet to dry dressings to the left thigh wound at least daily  She may shower and have dressing applied after showering.    LOS: 4 days   Velecia Ovitt,PA-C Plastic Surgery for Dr. Migdalia Dk [phone]

## 2014-05-13 NOTE — Progress Notes (Signed)
Spoke with pt regarding cpap.  Pt states she does not wear it at home and does not want to wear it tonight.  Pt was advised that RT is available all night and encouraged her to call should she change her mind.  RN aware.

## 2014-05-13 NOTE — Progress Notes (Signed)
ANTICOAGULATION CONSULT NOTE - Initial Consult  Pharmacy Consult for apixaban Indication: DVT  Allergies  Allergen Reactions  . Bactrim [Sulfamethoxazole-Tmp Ds] Other (See Comments)    Hyperkalemia (July 2015)  . Penicillins Hives    Patient Measurements: Height: 5\' 5"  (165.1 cm) Weight: 249 lb 1.9 oz (113 kg) (pt weighed on bariatric bed w/ air tank on board) IBW/kg (Calculated) : 57  Vital Signs: Temp: 98.4 F (36.9 C) (09/17 0629) Temp src: Oral (09/17 0629) BP: 182/75 mmHg (09/17 0629) Pulse Rate: 80 (09/17 0629)  Labs:  Recent Labs  05/11/14 0515 05/12/14 0620 05/13/14 0525  HGB 9.4* 9.6*  --   HCT 28.2* 28.9*  --   PLT 184 184  --   CREATININE 1.21* 1.01 0.96    Estimated Creatinine Clearance: 71.3 ml/min (by C-G formula based on Cr of 0.96).   Medical History: Past Medical History  Diagnosis Date  . Hypertension   . Hyperlipidemia   . CHF (congestive heart failure)   . GERD (gastroesophageal reflux disease)   . Depression   . Lymphedema of leg     left leg  . Pulmonary embolism 02/18/14    diagnosed at [facility] with CT angio chest  . Chronic indwelling Foley catheter   . Heart murmur     "just found out I have one" (04/07/2014)  . DVT (deep venous thrombosis) 01/2014    LLE; "went from my leg to my lungs"  . Pneumonia 1990's X 2  . Sleep apnea     doesn't use cpap machine or 02 at night (04/07/2014)  . Anemia   . History of blood transfusion 1990's X 2    "when they did my surgeries"  . Migraines     "none for years now" (04/07/2014)  . Arthritis     "arms, legs" (04/07/2014)  . Gout   . Urinary incontinence   . Chronic kidney disease (CKD), stage III (moderate)     [name] 04/07/2014  . Neuropathy   . Diabetes mellitus     "at one time" (04/07/2014)    Medications:  Scheduled:  . allopurinol  100 mg Oral Daily  . apixaban  5 mg Oral BID  . atorvastatin  40 mg Oral Daily  . calcium citrate  500 mg of elemental calcium Oral QID  .  Chlorhexidine Gluconate Cloth  6 each Topical Q0600  . cholecalciferol  1,000 Units Oral Daily  . docusate sodium  100 mg Oral BID  . furosemide  20 mg Oral Daily  . Influenza vac split quadrivalent PF  0.5 mL Intramuscular Tomorrow-1000  . insulin aspart  0-15 Units Subcutaneous TID WC  . iron polysaccharides  150 mg Oral Daily  . isosorbide mononitrate  10 mg Oral BID  . levofloxacin  500 mg Oral Daily  . magnesium oxide  400 mg Oral Daily  . metoprolol tartrate  50 mg Oral BID  . mupirocin ointment  1 application Nasal BID  . pantoprazole  80 mg Oral Q1200  . polyethylene glycol  17 g Oral Daily  . senna  1 tablet Oral BID  . sodium chloride  3 mL Intravenous Q12H   Infusions:  . sodium chloride 20 mL/hr at 05/10/14 0945    Assessment: 68 yo female admitted with Hgb of 4.9 requiring transfusion. Patient was on Lovenox 140mg  SQ q12 PTA for Hx PE/DVT and this was continued on admission starting 9/13. Hgb now holding steady at 9.6. Per Md orders, to d/c full dose Lovenox  and start PO apixaban instead.   Goal of Therapy:  monitor renal function and signs/symptoms of bleeding   Plan:  1) D/C Lovenox - last dose was given last night around 9pm 2) Start apixaban 5mg  PO BID beginning this AM when next dose of Lovenox would have been due 3) Monitor for bleeding 4) Will educate patient on new medication prior to discharge  Adrian Saran, PharmD, BCPS Pager [phone] 05/13/2014 10:20 AM

## 2014-05-13 NOTE — Progress Notes (Addendum)
PROGRESS NOTE  Kelly [name] EHM:[id number] DOB: 02/06/1946 DOA: 05/09/2014 PCP: Alvester Chou, NP  Assessment/Plan: #1 sepsis  -secondary to bacteremia and left lower extremity cellulitis.  -Urinalysis was negative.  -Chest x-ray negative for acute infiltrate. Urinalysis was negative.  -pro-calcitonin level of 3.94 from 4.75. Blood cultures growing 2 out of 2 gram-positive cocci in chains. Continue empiric IV vancomycin. Continue supportive care. Follow.  #2 bacteremia--group C Streptococcus  Preliminary wound cultures growing gram-positive cocci in chains. Likely seeded from left thigh wound or fungal dermatitis noted on foot. Continue empiric IV vancomycin. ID has assessed patient and aztreonam d/c'd. ID ff and appreciate input and rxcs.  -surveillance blood cultures negative #3 left lower extremity cellulitis with open left thigh wound  -left thigh wound with good granulation tissue.  -likely the portal of entry for the patient's bacteremia  -plan to narrow spectrum of abx--defer to ID--.started levofloxacin 05/12/14 -appreciate ID  -Patient has also been seen by plastics, Dr Migdalia Dk. Continue wound care.  -increase oxycodone to 10mg  q 4 hrs prn pain #4 hypokalemia  Potassium has been repleted.  #5 hypocalcemia/hypomagnesemia  -Likely secondary to VIT D deficiency.  -Corrected calcium is 9.8. Ionized calcium was 1.18.  -Patient's magnesium on admission was 0.7 which has been repleted -Phosphorus level is 3.0. PTH elevated at 161.  -Vitamin D 25- hydroxy levels low at 19. Will continue oral calcium supplementation and VIT D. Outpatient follow up.  #6 anemia  -On admission patient's hemoglobin was noted to be 4.9. Patient is status post 2 units packed red blood cells and hemoglobin currently of 9.4.  -Initial labs may have been spurious.  -Patient with no overt bleeding.  -Anemia panel consistent with anemia of chronic disease/iron deficiency anemia. Continue iron  supplementation. -Follow H&H.  #7 metabolic acidosis  -May be secondary to problems #1 and 2. Acidosis has improved on bicarbonate drip.  -D/C bicarbonate drip.  #8 type 2 diabetes  -Hemoglobin A1c--6.5  -CBGs controlled  -Continue sliding scale insulin.  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 Continue full dose Lovenox.  -05/13/14--d/c lovenox, started apixiban #10 sinus tachycardia  Likely secondary to problems #1,2, 3, 6.  -Heart rate improved.  -Patient is status post 2 units packed red blood cells  #36 chronic diastolic CHF/coronary artery disease  -Stable.  -Continue home regimen of beta blocker, Lasix, Imdur.  #12 hypertension  -Stable.  -Continue Lopressor,lasix and Imdur.  -05/13/14--increase metoprolol tartrate to 50mg  bid #13 gastroesophageal reflux disease  PPI.  #14 chronic kidney disease stage III  Stable.  Family Communication: Pt at beside  Disposition Plan: SNF 05/14/14 if stable  Antibiotics:  Vancomycin 05/09/14>>>05/12/14 Levofloxacin 05/12/14>>>          Procedures/Studies: Dg Chest Port 1 View  05/09/2014   CLINICAL DATA:  Central line placement.  EXAM: PORTABLE CHEST - 1 VIEW 5:22 p.m.  COMPARISON:  05/09/2014 at 9:54 a.m.  FINDINGS: Right jugular vein catheter has been inserted and the tip is in the superior vena cava above the right atrium in good position. Heart size is within normal limits. Slight pulmonary vascular prominence. Peribronchial thickening as noted previously. No infiltrates or effusions.  IMPRESSION: Central line in good position. No pneumothorax. Persistent peribronchial thickening and slight pulmonary vascular prominence.   Electronically Signed   By: Rozetta Nunnery M.D.   On: 05/09/2014 18:04   Dg Chest Port 1 View  05/09/2014   CLINICAL DATA:  68 year old female with shortness of breath and fever.  EXAM: PORTABLE CHEST - 1 VIEW  COMPARISON:  03/03/2014  FINDINGS: Upper limits normal heart size  again noted.  Diffuse peribronchial thickening is noted and increased.  No definite airspace disease, pleural effusion or pneumothorax noted.  No acute bony abnormalities are identified.  IMPRESSION: Increasing diffuse peribronchial thickening/bronchitis. No definite focal airspace opacity on this single view.   Electronically Signed   By: Hassan Rowan M.D.   On: 05/09/2014 11:34   Dg Foot 2 Views Left  04/14/2014   CLINICAL DATA:  Possible great toe osteomyelitis.  EXAM: LEFT FOOT - 2 VIEW  COMPARISON:  None.  FINDINGS: There is no evidence of fracture or dislocation. Vascular calcifications are noted. No definite lytic destruction is seen to suggest osteomyelitis. Spurring of posterior calcaneus is noted. Swelling of dorsal soft tissues is noted suggesting cellulitis.  IMPRESSION: Swelling of dorsal soft tissues is seen suggesting cellulitis. No lytic destruction is seen to suggest osteomyelitis. However, MRI would be more sensitive for the detection of osteomyelitis.   Electronically Signed   By: Sabino Dick M.D.   On: 04/14/2014 15:29   Ct Extrem Lower W Cm Bil  05/09/2014   CLINICAL DATA:  Fever. Soft tissue wound in the left thigh with cellulitis.  EXAM: CT OF THE LOWER BILATERAL EXTREMITY WITH CONTRAST  TECHNIQUE: Multidetector CT imaging of the lower bilateral extremities was performed according to the standard protocol following intravenous contrast administration.  COMPARISON:  None.  CONTRAST:  164mL OMNIPAQUE IOHEXOL 300 MG/ML  SOLN  FINDINGS: There is extensive subcutaneous edema throughout the left lower extremity. No acute abnormality of the bones. 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 No osteomyelitis. No joint effusions.  There is no evidence of a soft tissue abscess. There is similar but less extensive subcutaneous edema in the medial aspect of the right thigh.  There is a focal retracted area in the skin surface of the  medial aspect of the left thigh consistent with the wound.  Incidental note is made of multiple small dense nodular areas in the subcutaneous fat of the panniculus consistent with injection granulomas.  IMPRESSION: Extensive subcutaneous edema throughout the left lower extremity with a soft tissue wound in the medial aspect of the left thigh. There is no abscess, osteomyelitis, joint effusion, or myositis.   Electronically Signed   By: Rozetta Nunnery M.D.   On: 05/09/2014 13:43         Subjective: Patient complains of pain in her left thigh area near her wound. She denies any fevers, chills, chest pain, shortness breath, nausea, vomiting, diarrhea.  Objective: Filed Vitals:   05/12/14 1600 05/12/14 2017 05/13/14 0629 05/13/14 1334  BP: 169/75 170/61 182/75 174/68  Pulse: 77 82 80 86  Temp: 99.1 F (37.3 C) 98.2 F (36.8 C) 98.4 F (36.9 C) 98.4 F (36.9 C)  TempSrc: Core (Comment) Oral Oral Oral  Resp:  20 20 23   Height:      Weight:   113 kg (249 lb 1.9 oz)   SpO2: 98% 100% 93% 98%    Intake/Output Summary (Last 24 hours) at 05/13/14 1723 Last data filed at 05/13/14 1500  Gross per 24 hour  Intake    940 ml  Output   1150 ml  Net   -210 ml   Weight change: -19 kg (-41 lb 14.2 oz) Exam:   General:  Pt is  alert, follows commands appropriately, not in acute distress  HEENT: No icterus, No thrush, [place]/AT  Cardiovascular: RRR, S1/S2, no rubs, no gallops  Respiratory: CTA bilaterally, no wheezing, no crackles, no rhonchi  Abdomen: Soft/+BS, non tender, non distended, no guarding Extremities: 3+LE edema, No lymphangitis, No petechiae, No rashes, no synovitis;L-leg wound with good granulation, no necrosis, no crepitance    Data Reviewed: Basic Metabolic Panel:  Recent Labs Lab 05/09/14 1004  05/10/14 0450 05/10/14 1250 05/11/14 0515 05/12/14 0620 05/13/14 0525  NA  --   < > 139 134* 136* 135* 136*  K  --   < > 5.6* 5.2 4.7 4.2 4.0  CL  --   < > 106 102 103 101  103  CO2  --   < > 22 23 24 21 23   GLUCOSE  --   < > 168* 184* 119* 108* 109*  BUN  --   < > 18 19 18 16 14   CREATININE  --   < > 1.25* 1.23* 1.21* 1.01 0.96  CALCIUM  --   < > 8.1* 8.4 8.4 8.6 8.5  MG 0.7*  --  2.2  --  1.9  --   --   PHOS 1.1*  --   --   --  3.0  --   --   < > = values in this interval not displayed. Liver Function Tests:  Recent Labs Lab 05/09/14 1005 05/10/14 0450  AST 6 9  ALT 5 7  ALKPHOS 54 88  BILITOT <0.2* 0.4  PROT 3.7* 6.4  ALBUMIN 1.1* 1.9*   No results found for this basename: LIPASE, AMYLASE,  in the last 168 hours No results found for this basename: AMMONIA,  in the last 168 hours CBC:  Recent Labs Lab 05/09/14 1005 05/09/14 2200 05/10/14 0450 05/11/14 0515 05/12/14 0620  WBC 6.3  --  12.2* 11.1* 8.9  NEUTROABS 5.4  --   --   --  6.6  HGB 4.9* 10.7* 9.7* 9.4* 9.6*  HCT 15.3* 32.2* 29.3* 28.2* 28.9*  MCV 92.2  --  88.5 88.1 89.2  PLT 105*  --  200 184 184   Cardiac Enzymes: No results found for this basename: CKTOTAL, CKMB, CKMBINDEX, TROPONINI,  in the last 168 hours BNP: No components found with this basename: POCBNP,  CBG:  Recent Labs Lab 05/12/14 1623 05/12/14 2147 05/13/14 0850 05/13/14 1248 05/13/14 1613  GLUCAP 175* 143* 127* 141* 127*    Recent Results (from the past 240 hour(s))  CULTURE, BLOOD (ROUTINE X 2)     Status: None   Collection Time    05/09/14 10:06 AM      Result Value Ref Range Status   Specimen Description BLOOD LEFT FOREARM   Final   Special Requests BOTTLES DRAWN AEROBIC AND ANAEROBIC 3CC   Final   Culture  Setup Time     Final   Value: 05/09/2014 15:16     Performed at [facility]   Culture     Final   Value: STREPTOCOCCUS GROUP C     Note: SUSCEPTIBILITIES PERFORMED ON PREVIOUS CULTURE WITHIN THE LAST 5 DAYS.     Note: Gram Stain Report Called to,Read Back By and Verified With: Gladys Damme RN on 05/10/14 at 04:03 by Rise Mu     Performed at [facility]   Report Status  05/12/2014 FINAL   Final  CULTURE, BLOOD (ROUTINE X 2)     Status: None   Collection Time  05/09/14 10:16 AM      Result Value Ref Range Status   Specimen Description BLOOD RIGHT ANTECUBITAL   Final   Special Requests BOTTLES DRAWN AEROBIC AND ANAEROBIC 3CC   Final   Culture  Setup Time     Final   Value: 05/09/2014 15:16     Performed at [facility]   Culture     Final   Value: STREPTOCOCCUS GROUP C     Note: Gram Stain Report Called to,Read Back By and Verified With: ANGIE WELCH@1 :51PM ON 05/10/14 BY COOKV     Performed at [facility]   Report Status 05/12/2014 FINAL   Final   Organism ID, Bacteria STREPTOCOCCUS GROUP C   Final  URINE CULTURE     Status: None   Collection Time    05/09/14 10:29 AM      Result Value Ref Range Status   Specimen Description URINE, CATHETERIZED   Final   Special Requests NONE   Final   Culture  Setup Time     Final   Value: 05/09/2014 19:58     Performed at [place]     Final   Value: 10,000 COLONIES/ML     Performed at [facility]   Culture     Final   Value: PROTEUS MIRABILIS     Performed at [facility]   Report Status PENDING   Incomplete  MRSA PCR SCREENING     Status: Abnormal   Collection Time    05/09/14  3:52 PM      Result Value Ref Range Status   MRSA by PCR POSITIVE (*) NEGATIVE Final   Comment:            The GeneXpert MRSA Assay (FDA     approved for NASAL specimens     only), is one component of a     comprehensive MRSA colonization     surveillance program. It is not     intended to diagnose MRSA     infection nor to guide or     monitor treatment for     MRSA infections.     RESULT CALLED TO, READ BACK BY AND VERIFIED WITH:     S.Holzman AT 1725 ON [id number] BY C.BONGEL  CULTURE, BLOOD (ROUTINE X 2)     Status: None   Collection Time    05/12/14  3:42 PM      Result Value Ref Range Status   Specimen Description BLOOD LEFT HAND   Final   Special Requests  BOTTLES DRAWN AEROBIC AND ANAEROBIC 5CC   Final   Culture  Setup Time     Final   Value: 05/12/2014 20:08     Performed at [facility]   Culture     Final   Value:        BLOOD CULTURE RECEIVED NO GROWTH TO DATE CULTURE WILL BE HELD FOR 5 DAYS BEFORE ISSUING A FINAL NEGATIVE REPORT     Performed at [facility]   Report Status PENDING   Incomplete  CULTURE, BLOOD (ROUTINE X 2)     Status: None   Collection Time    05/12/14  3:46 PM      Result Value Ref Range Status   Specimen Description BLOOD RIGHT HAND   Final   Special Requests BOTTLES DRAWN AEROBIC AND ANAEROBIC 5CC   Final   Culture  Setup Time     Final  Value: 05/12/2014 20:08     Performed at [facility]   Culture     Final   Value:        BLOOD CULTURE RECEIVED NO GROWTH TO DATE CULTURE WILL BE HELD FOR 5 DAYS BEFORE ISSUING A FINAL NEGATIVE REPORT     Performed at [facility]   Report Status PENDING   Incomplete     Scheduled Meds: . allopurinol  100 mg Oral Daily  . apixaban  5 mg Oral BID  . atorvastatin  40 mg Oral Daily  . calcium citrate  500 mg of elemental calcium Oral QID  . Chlorhexidine Gluconate Cloth  6 each Topical Q0600  . cholecalciferol  1,000 Units Oral Daily  . docusate sodium  100 mg Oral BID  . furosemide  20 mg Oral Daily  . Influenza vac split quadrivalent PF  0.5 mL Intramuscular Tomorrow-1000  . insulin aspart  0-15 Units Subcutaneous TID WC  . iron polysaccharides  150 mg Oral Daily  . isosorbide mononitrate  10 mg Oral BID  . levofloxacin  500 mg Oral Daily  . magnesium oxide  400 mg Oral Daily  . metoprolol tartrate  50 mg Oral BID  . mupirocin ointment  1 application Nasal BID  . pantoprazole  80 mg Oral Q1200  . polyethylene glycol  17 g Oral Daily  . senna  1 tablet Oral BID  . sodium chloride  3 mL Intravenous Q12H   Continuous Infusions: . sodium chloride 20 mL/hr at 05/10/14 0945     Gae Bihl, DO  [facility] Pager  [phone]  If 7PM-7AM, please contact night-coverage [email or web address] Password [email or web address] 05/13/2014, 5:23 PM   LOS: 4 days

## 2014-05-14 ENCOUNTER — Other Ambulatory Visit: Payer: Self-pay | Admitting: *Deleted

## 2014-05-14 LAB — BASIC METABOLIC PANEL
Anion gap: 11 (ref 5–15)
BUN: 14 mg/dL (ref 6–23)
CHLORIDE: 105 meq/L (ref 96–112)
CO2: 24 meq/L (ref 19–32)
Calcium: 8.5 mg/dL (ref 8.4–10.5)
Creatinine, Ser: 1.01 mg/dL (ref 0.50–1.10)
GFR calc Af Amer: 65 mL/min — ABNORMAL LOW (ref >90)
GFR, EST NON AFRICAN AMERICAN: 56 mL/min — AB (ref >90)
GLUCOSE: 110 mg/dL — AB (ref 70–99)
POTASSIUM: 4 meq/L (ref 3.7–5.3)
SODIUM: 140 meq/L (ref 137–147)

## 2014-05-14 LAB — CBC
HEMATOCRIT: 28.8 % — AB (ref 36.0–46.0)
HEMOGLOBIN: 9.1 g/dL — AB (ref 12.0–15.0)
MCH: 28.4 pg (ref 26.0–34.0)
MCHC: 31.6 g/dL (ref 30.0–36.0)
MCV: 90 fL (ref 78.0–100.0)
PLATELETS: 196 10*3/uL (ref 150–400)
RBC: 3.2 MIL/uL — AB (ref 3.87–5.11)
RDW: 14.7 % (ref 11.5–15.5)
WBC: 9.8 10*3/uL (ref 4.0–10.5)

## 2014-05-14 LAB — URINE CULTURE

## 2014-05-14 LAB — GLUCOSE, CAPILLARY: Glucose-Capillary: 112 mg/dL — ABNORMAL HIGH (ref 70–99)

## 2014-05-14 MED ORDER — OXYCODONE HCL 10 MG PO TABS: 10.0000 mg | ORAL_TABLET | ORAL | Status: DC | PRN

## 2014-05-14 MED ORDER — OXYCODONE HCL 10 MG PO TABS: ORAL_TABLET | ORAL | Status: DC

## 2014-05-14 MED ORDER — VITAMIN D3 25 MCG (1000 UNIT) PO TABS: 1000.0000 [IU] | ORAL_TABLET | Freq: Every day | ORAL | Status: DC

## 2014-05-14 MED ORDER — LEVOFLOXACIN 500 MG PO TABS: 500.0000 mg | ORAL_TABLET | Freq: Every day | ORAL | Status: DC

## 2014-05-14 MED ORDER — APIXABAN 5 MG PO TABS: 5.0000 mg | ORAL_TABLET | Freq: Two times a day (BID) | ORAL | Status: DC

## 2014-05-14 MED ORDER — METOPROLOL TARTRATE 50 MG PO TABS
75.0000 mg | ORAL_TABLET | Freq: Two times a day (BID) | ORAL | Status: DC
  Administered 2014-05-14: 75 mg via ORAL
  Filled 2014-05-14 (×2): qty 1

## 2014-05-14 MED ORDER — CALCIUM CITRATE 950 (200 CA) MG PO TABS: 500.0000 mg | ORAL_TABLET | Freq: Two times a day (BID) | ORAL | Status: DC

## 2014-05-14 MED ORDER — METOPROLOL TARTRATE 25 MG PO TABS: 75.0000 mg | ORAL_TABLET | Freq: Two times a day (BID) | ORAL | Status: AC

## 2014-05-14 NOTE — Telephone Encounter (Signed)
[facility] 

## 2014-05-14 NOTE — Discharge Instructions (Signed)
Saline wet to dry dressings to the left thigh daily and as needed if dressings fall off Patient may shower daily  Information on my medicine - ELIQUIS (apixaban)  This medication education was reviewed with me or my healthcare representative as part of my discharge preparation.  The pharmacist that spoke with me during my hospital stay was:  Rudean Haskell, [facility]  Why was Eliquis prescribed for you? Eliquis was prescribed to treat blood clots that may have been found in the veins of your legs (deep vein thrombosis) or in your lungs (pulmonary embolism) and to reduce the risk of them occurring again.  What do You need to know about Eliquis ? Take Apixaban (Eliquis) 5 mg tablet orallyTWICE daily.  Eliquis may be taken with or without food.   Try to take the dose about the same time in the morning and in the evening. If you have difficulty swallowing the tablet whole please discuss with your pharmacist how to take the medication safely.  Take Eliquis exactly as prescribed and DO NOT stop taking Eliquis without talking to the doctor who prescribed the medication.  Stopping may increase your risk of developing a new blood clot.  Refill your prescription before you run out.  After discharge, you should have regular check-up appointments with your healthcare provider that is prescribing your Eliquis.    What do you do if you miss a dose? If a dose of ELIQUIS is not taken at the scheduled time, take it as soon as possible on the same day and twice-daily administration should be resumed. The dose should not be doubled to make up for a missed dose.  Important Safety Information A possible side effect of Eliquis is bleeding. You should call your healthcare provider right away if you experience any of the following:   Bleeding from an injury or your nose that does not stop.   Unusual colored urine (red or dark brown) or unusual colored stools (red or black).   Unusual bruising for unknown  reasons.   A serious fall or if you hit your head (even if there is no bleeding).  Some medicines may interact with Eliquis and might increase your risk of bleeding or clotting while on Eliquis. To help avoid this, consult your healthcare provider or pharmacist prior to using any new prescription or non-prescription medications, including herbals, vitamins, non-steroidal anti-inflammatory drugs (NSAIDs) and supplements.  This website has more information on Eliquis (apixaban): http://www.eliquis.com/eliquis/home

## 2014-05-14 NOTE — Progress Notes (Signed)
Report called to [facility]. Hospital course and discharge instructions reviewed. All questions answered. Callie Fielding RN

## 2014-05-14 NOTE — Progress Notes (Signed)
Patient is set to discharge back to [facility] today. Patient & niece, [name] aware - patient states that she would prefer to just return there rather than go to another SNF. Discharge packet given to RN, Robert Bellow. [facility] called for transport.   Clinical Social Work Department CLINICAL SOCIAL WORK PLACEMENT NOTE 05/14/2014  Patient:  Kelly [name], Kelly [name]  Account Number:  [id number] Admit date:  05/09/2014  Clinical Social Worker:  Ulyess Blossom  Date/time:  05/11/2014 05:00 PM  Clinical Social Work is seeking post-discharge placement for this patient at the following level of care:   [place]   (*CSW will update this form in Epic as items are completed)   05/11/2014  Patient/family provided with [facility] Department of Clinical Social Work's list of facilities offering this level of care within the geographic area requested by the patient (or if unable, by the patient's family).  05/11/2014  Patient/family informed of their freedom to choose among providers that offer the needed level of care, that participate in Medicare, Medicaid or managed care program needed by the patient, have an available bed and are willing to accept the patient.  05/11/2014  Patient/family informed of [facility]' ownership interest in [facility], as well as of the fact that they are under no obligation to receive care at this facility.  PASARR submitted to EDS on 05/11/2014 PASARR number received on 05/11/2014  FL2 transmitted to all facilities in geographic area requested by pt/family on  05/11/2014 FL2 transmitted to all facilities within larger geographic area on   Patient informed that his/her managed care company has contracts with or will negotiate with  certain facilities, including the following:     Patient/family informed of bed offers received:  05/11/2014 Patient chooses bed at [facility] Physician recommends and patient chooses bed at    Patient to be  transferred to [facility] on  05/14/2014 Patient to be transferred to facility by [facility] Patient and family notified of transfer on 05/14/2014 Name of family member notified:  patient's niece, [name] via phone  The following physician request were entered in Epic:   Additional Comments:   Raynaldo Opitz, [place] Social Worker cell #: [phone]

## 2014-05-14 NOTE — Discharge Summary (Signed)
Physician Discharge Summary  Kelly [name] TMH:[id number] DOB: [birth year] DOA: 05/09/2014  PCP: Alvester Chou, NP  Admit date: 05/09/2014 Discharge date: 05/14/2014  Recommendations for Outpatient Follow-up:  1. Pt will need to follow up with PCP in 2 weeks post discharge 2. Follow up at [facility] on 05/17/14 @0830  BID wet to dry dressings to patient's left thigh wound--wet to dry dressings on the thigh wound with kerlex/4x4 gauze, ABD      Discharge Diagnoses:  #1 sepsis  -secondary to bacteremia and left lower extremity cellulitis.  -Urinalysis was negative.  -Chest x-ray negative for acute infiltrate. -pro-calcitonin level of 3.94 from 4.75.  Blood cultures growing 2 out of 2 streptococcus  -Initially started on IV Vancomycin -Continue supportive care. #2 bacteremia--group C Streptococcus  Preliminary wound cultures growing gram-positive cocci in chains. Likely seeded from left thigh wound or fungal dermatitis noted on foot. Continue empiric IV vancomycin. ID has assessed patient and aztreonam d/c'd. ID ff and appreciate input and rxcs.  -surveillance blood cultures negative  -plan levofloxacin 500mg  x 8 more days which will complete 14 days of therapy #3 left lower extremity cellulitis with open left thigh wound  -left thigh wound with good granulation tissue.  -likely the portal of entry for the patient's bacteremia  -plan to narrow spectrum of abx--defer to ID--.started levofloxacin 05/12/14  -appreciate ID  -Patient has also been seen by plastics, Dr Migdalia Dk. Continue wound care.  -increase oxycodone to 10mg  q 4 hrs prn pain  #4 hypokalemia  Potassium has been repleted.  #5 hypocalcemia/hypomagnesemia  -Likely secondary to VIT D deficiency.  -Corrected calcium is 9.8. Ionized calcium was 1.18.  -Patient's magnesium on admission was 0.7 which has been repleted -Phosphorus level is 3.0. PTH elevated at 161.  -Vitamin D 25- hydroxy levels low at 19. Will  continue oral calcium supplementation and VIT D. Outpatient follow up.  #6 anemia  -On admission patient's hemoglobin was noted to be 4.9. Patient is status post 2 units packed red blood cells and hemoglobin currently of 9.4.  -Initial labs may have been spurious.  -Patient's hemoglobin remained stable throughout the rest of hospitalization. -Patient with no overt bleeding.  -Anemia panel consistent with anemia of chronic disease/iron deficiency anemia. Continue iron supplementation #7 metabolic acidosis  -May be secondary to problems #1 and 2. Acidosis has improved on bicarbonate drip.  -D/C bicarbonate drip.  #8 type 2 diabetes  -Hemoglobin A1c--6.5  -CBGs controlled  -Continue sliding scale insulin.  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  -Heart rate improved.  -Patient is status post 2 units packed red blood cells  #92 chronic diastolic CHF/coronary artery disease  -Stable.  -Continue home regimen of beta blocker, Lasix, Imdur.  #12 hypertension  -Stable.  -Continue Lopressor,lasix and Imdur.  -05/13/14--increase metoprolol tartrate to 50mg  bid  -05/14/14--increase metoprolol tartrate to 75mg  bid #13 gastroesophageal reflux disease  PPI.  #14 chronic kidney disease stage III  Stable.  -Serum creatinine 1.01 on the day of discharge Family Communication: Pt at beside  Disposition Plan: SNF 05/14/14 if stable  Antibiotics:  Vancomycin 05/09/14>>>05/12/14  Levofloxacin 05/12/14>>>   Discharge Condition: Stable  Disposition: Skilled nursing facility Follow-up Information   Follow up with [facility]              On 05/17/2014. (For wound re-check on 05/17/14  @0830 )    Contact information:   [address] [place] [address] [phone]      Diet: Heart healthy Wt Readings from Last 3 Encounters:  05/14/14 117 kg (257 lb 15 oz)  05/07/14 144.244 kg (318 lb)  04/29/14 144.244 kg (318 lb)    History of present illness:  68 y.o. female  Skilled Nursing home resident at [facility], with a past medical history of hypertension, obstructive sleep apnea, gastroesophageal reflux disease, hyperlipidemia, recent diagnosis of pulmonary embolism and DVT 02/18/2014 supposedly on chronic anticoagulation, diabetes mellitus, chronic kidney disease stage III, prior history of panniculectomy type procedure, left thigh ulcer being managed by Dr. Migdalia Dk in the wound care clinic status post multiple I and D and a cell with VAC placement , history of IVC filter who presented to the ED with fever.  Patient states wound VAC was discontinued 2 days prior to admission as wound VAC wouldn't stay on and M.D. at the skilled nursing facility had it removed and wet-to-dry dressing changes were being applied.  Patient endorses a one-day history of fevers, chills, bilateral leg pain, some shortness of breath, nausea. . Patient denies any diarrhea no constipation.  Patient was seen in the emergency room chest x-ray which was done was consistent with bronchitis. Comprehensive metabolic profile done at a potassium of 2.6 chloride of 122 bicarbonate of 12 albumin of 1.1 otherwise was within normal limits. Lactic acid level was 1.10. CBC obtained had a hemoglobin of 4.9 and a platelet count of 105 otherwise is within normal limits. Urinalysis done was nitrite negative leukocytes negative.  CT of the left lower extremity done showed extensive subcutaneous edema throughout the left lower extremity with a soft tissue wound in the medial aspect of the left eye there was no abscess, no osteomyelitis, no joint effusion, no myositis. 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  Patient was typed and crossed and a unit of packed red blood cells was being transfused in the emergency room. Patient was pan cultured and placed empirically on IV Levaquin, vancomycin, aztreonam.  Blood cultures ultimately grew Streptococcus group C. The patient's vancomycin was changed to levofloxacin by Dr. Megan Salon. The patient improved clinically and was moved out of the second unit. She remained clinically stable and afebrile. Plastic surgery saw the patient and recommended continuing wet-to-dry dressings on the patient's left leg. There is recommended followup at the [facility] clinic 05/17/2014 in the morning. The patient was transfused 2 units PRBCs. Her hemoglobin responded properly and remained stable throughout the rest of the hospitalization. The blood cultures were obtained and remained negative.   Consultants: Plastic surgery--Dr. Migdalia Dk  Discharge Exam: Filed Vitals:   05/14/14 0617  BP: 173/67  Pulse: 76  Temp: 98.2 F (36.8 C)  Resp: 20   Filed Vitals:   05/13/14 0629 05/13/14 1334 05/13/14 2017 05/14/14 0617  BP: 182/75 174/68 169/65 173/67  Pulse: 80 86 77 76  Temp: 98.4 F (36.9 C) 98.4 F (36.9 C) 98.9 F (37.2 C) 98.2 F (36.8 C)  TempSrc: Oral Oral Oral Oral  Resp: 20 23 20 20   Height:      Weight: 113 kg (249 lb 1.9 oz)   117 kg (257 lb 15 oz)  SpO2: 93% 98%  99% 96%   General: A&O x 3, NAD, pleasant, cooperative Cardiovascular: RRR, no rub, no gallop, no S3 Respiratory: CTAB, no wheeze, no rhonchi Abdomen:soft, nontender, nondistended, positive bowel sounds Extremities: 3+ LE edema, No lymphangitis, no petechiae  Discharge Instructions      Discharge Instructions   Diet - low sodium heart healthy    Complete by:  As directed      Increase activity slowly    Complete by:  As directed             Medication List    STOP taking these medications       chlorthalidone  25 MG tablet  Commonly known as:  HYGROTON     ciprofloxacin 500 MG tablet  Commonly known as:  CIPRO     enoxaparin 150 MG/ML injection  Commonly known as:  LOVENOX      TAKE these medications       acetaminophen 325 MG tablet  Commonly known as:  TYLENOL  Take 650 mg by mouth every 6 (six) hours as needed for fever.     allopurinol 100 MG tablet  Commonly known as:  ZYLOPRIM  Take 100 mg by mouth daily.     apixaban 5 MG Tabs tablet  Commonly known as:  ELIQUIS  Take 1 tablet (5 mg total) by mouth 2 (two) times daily.     atorvastatin 40 MG tablet  Commonly known as:  LIPITOR  Take 40 mg by mouth daily.     calcium citrate 950 MG tablet  Commonly known as:  CALCITRATE - dosed in mg elemental calcium  Take 2.5 tablets (500 mg of elemental calcium total) by mouth 2 (two) times daily.     cholecalciferol 1000 UNITS tablet  Commonly known as:  VITAMIN D  Take 1 tablet (1,000 Units total) by mouth daily.     docusate sodium 100 MG capsule  Commonly known as:  COLACE  Take 100 mg by mouth 2 (two) times daily.     esomeprazole 40 MG capsule  Commonly known as:  NEXIUM  Take 40 mg by mouth daily before breakfast. Take on an empty stomach     furosemide 20 MG tablet  Commonly known as:  LASIX  Take 20 mg by mouth daily.     insulin aspart 100 UNIT/ML injection  Commonly known as:  novoLOG  - Inject 0-9 Units into the skin 3 (three) times daily before meals. 70-120=0  - 121-150=1  - 151-200=2  - 201-250=3  - 251-300=5  - 301-350=7  - 351-400=9     Iron Polysacch Cmplx-B12-FA 150-0.025-1 MG Caps  Take 1 tablet by mouth every morning.     isosorbide mononitrate 10 MG tablet  Commonly known as:  ISMO,MONOKET  Take 10 mg by mouth 2 (two) times daily.     levofloxacin 500 MG tablet  Commonly known as:  LEVAQUIN  Take 1 tablet (500 mg total) by mouth daily.     magnesium oxide 400 MG tablet  Commonly known as:  MAG-OX  Take 400 mg by mouth daily.      metoprolol tartrate 25 MG tablet  Commonly known as:  LOPRESSOR  Take 3 tablets (75 mg total) by mouth 2 (two) times daily.     Oxycodone HCl 10 MG Tabs  Take 1 tablet (10 mg total) by mouth every 4 (four) hours as needed for moderate pain.     polyethylene glycol packet  Commonly known as:  MIRALAX / GLYCOLAX  Take 17 g by mouth daily. Mix with 4-6 oz liquid     potassium chloride 10 MEQ tablet  Commonly known as:  K-DUR  Take 10 mEq by mouth daily.     senna 8.6 MG Tabs tablet  Commonly known as:  SENOKOT  Take 1 tablet (8.6 mg total) by mouth 2 (two) times daily.         The results of significant diagnostics from this hospitalization (including imaging, microbiology, ancillary and laboratory) are listed below for reference.    Significant Diagnostic Studies: Dg Chest Port 1 View  05/09/2014   CLINICAL DATA:  Central line placement.  EXAM: PORTABLE CHEST - 1 VIEW 5:22 p.m.  COMPARISON:  05/09/2014 at 9:54 a.m.  FINDINGS: Right jugular vein catheter has been inserted and the tip is in the superior vena cava above the right atrium in good position. Heart size is within normal limits. Slight pulmonary vascular prominence. Peribronchial thickening as noted previously. No infiltrates or effusions.  IMPRESSION: Central line in good position. No pneumothorax. Persistent peribronchial thickening and slight pulmonary vascular prominence.   Electronically Signed   By: Rozetta Nunnery M.D.   On: 05/09/2014 18:04   Dg Chest Port 1 View  05/09/2014   CLINICAL DATA:  68 year old female with shortness of breath and fever.  EXAM: PORTABLE CHEST - 1 VIEW  COMPARISON:  03/03/2014  FINDINGS: Upper limits normal heart size again noted.  Diffuse peribronchial thickening is noted and increased.  No definite airspace disease, pleural effusion or pneumothorax noted.  No acute bony abnormalities are identified.  IMPRESSION: Increasing diffuse peribronchial thickening/bronchitis. No definite focal airspace  opacity on this single view.   Electronically Signed   By: Hassan Rowan M.D.   On: 05/09/2014 11:34   Dg Foot 2 Views Left  04/14/2014   CLINICAL DATA:  Possible great toe osteomyelitis.  EXAM: LEFT FOOT - 2 VIEW  COMPARISON:  None.  FINDINGS: There is no evidence of fracture or dislocation. Vascular calcifications are noted. No definite lytic destruction is seen to suggest osteomyelitis. Spurring of posterior calcaneus is noted. Swelling of dorsal soft tissues is noted suggesting cellulitis.  IMPRESSION: Swelling of dorsal soft tissues is seen suggesting cellulitis. No lytic destruction is seen to suggest osteomyelitis. However, MRI would be more sensitive for the detection of osteomyelitis.   Electronically Signed   By: Sabino Dick M.D.   On: 04/14/2014 15:29   Ct Extrem Lower W Cm Bil  05/09/2014   CLINICAL DATA:  Fever. Soft tissue wound in the left thigh with cellulitis.  EXAM: CT OF THE LOWER BILATERAL EXTREMITY WITH CONTRAST  TECHNIQUE: Multidetector CT imaging of the lower bilateral extremities was performed according to the standard protocol following intravenous contrast administration.  COMPARISON:  None.  CONTRAST:  171mL OMNIPAQUE IOHEXOL 300 MG/ML  SOLN  FINDINGS: There is extensive subcutaneous edema throughout the left lower extremity. No acute abnormality of the bones. 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 No osteomyelitis. No joint effusions.  There is no evidence of a soft tissue abscess. There is similar but less extensive subcutaneous edema in the medial aspect of the right thigh.  There is a focal retracted area in the skin surface of the medial aspect of the left thigh consistent with the wound.  Incidental note is made of multiple small dense nodular areas in the subcutaneous fat of the panniculus consistent with injection granulomas.  IMPRESSION: Extensive subcutaneous  edema throughout the left lower extremity with a  soft tissue wound in the medial aspect of the left thigh. There is no abscess, osteomyelitis, joint effusion, or myositis.   Electronically Signed   By: Rozetta Nunnery M.D.   On: 05/09/2014 13:43     Microbiology: Recent Results (from the past 240 hour(s))  CULTURE, BLOOD (ROUTINE X 2)     Status: None   Collection Time    05/09/14 10:06 AM      Result Value Ref Range Status   Specimen Description BLOOD LEFT FOREARM   Final   Special Requests BOTTLES DRAWN AEROBIC AND ANAEROBIC 3CC   Final   Culture  Setup Time     Final   Value: 05/09/2014 15:16     Performed at [facility]   Culture     Final   Value: STREPTOCOCCUS GROUP C     Note: SUSCEPTIBILITIES PERFORMED ON PREVIOUS CULTURE WITHIN THE LAST 5 DAYS.     Note: Gram Stain Report Called to,Read Back By and Verified With: Gladys Damme RN on 05/10/14 at 04:03 by Rise Mu     Performed at [facility]   Report Status 05/12/2014 FINAL   Final  CULTURE, BLOOD (ROUTINE X 2)     Status: None   Collection Time    05/09/14 10:16 AM      Result Value Ref Range Status   Specimen Description BLOOD RIGHT ANTECUBITAL   Final   Special Requests BOTTLES DRAWN AEROBIC AND ANAEROBIC 3CC   Final   Culture  Setup Time     Final   Value: 05/09/2014 15:16     Performed at [facility]   Culture     Final   Value: STREPTOCOCCUS GROUP C     Note: Gram Stain Report Called to,Read Back By and Verified With: ANGIE WELCH@1 :51PM ON 05/10/14 BY COOKV     Performed at [facility]   Report Status 05/12/2014 FINAL   Final   Organism ID, Bacteria STREPTOCOCCUS GROUP C   Final  URINE CULTURE     Status: None   Collection Time    05/09/14 10:29 AM      Result Value Ref Range Status   Specimen Description URINE, CATHETERIZED   Final   Special Requests NONE   Final   Culture  Setup Time     Final   Value: 05/09/2014 19:58     Performed at [facility]     Final   Value: 10,000 COLONIES/ML     Performed  at [facility]   Culture     Final   Value: PROTEUS MIRABILIS     Performed at [facility]   Report Status PENDING   Incomplete  MRSA PCR SCREENING     Status: Abnormal   Collection Time    05/09/14  3:52 PM      Result Value Ref Range Status   MRSA by PCR POSITIVE (*) NEGATIVE Final   Comment:            The GeneXpert MRSA Assay (FDA     approved for NASAL specimens     only), is one component of a     comprehensive MRSA colonization     surveillance program. It is not     intended to diagnose MRSA     infection nor to guide or     monitor treatment for     MRSA  infections.     RESULT CALLED TO, READ BACK BY AND VERIFIED WITH:     S.Montufar AT 1725 ON [id number] BY C.BONGEL  CULTURE, BLOOD (ROUTINE X 2)     Status: None   Collection Time    05/12/14  3:42 PM      Result Value Ref Range Status   Specimen Description BLOOD LEFT HAND   Final   Special Requests BOTTLES DRAWN AEROBIC AND ANAEROBIC 5CC   Final   Culture  Setup Time     Final   Value: 05/12/2014 20:08     Performed at [facility]   Culture     Final   Value:        BLOOD CULTURE RECEIVED NO GROWTH TO DATE CULTURE WILL BE HELD FOR 5 DAYS BEFORE ISSUING A FINAL NEGATIVE REPORT     Performed at [facility]   Report Status PENDING   Incomplete  CULTURE, BLOOD (ROUTINE X 2)     Status: None   Collection Time    05/12/14  3:46 PM      Result Value Ref Range Status   Specimen Description BLOOD RIGHT HAND   Final   Special Requests BOTTLES DRAWN AEROBIC AND ANAEROBIC 5CC   Final   Culture  Setup Time     Final   Value: 05/12/2014 20:08     Performed at [facility]   Culture     Final   Value:        BLOOD CULTURE RECEIVED NO GROWTH TO DATE CULTURE WILL BE HELD FOR 5 DAYS BEFORE ISSUING A FINAL NEGATIVE REPORT     Performed at [facility]   Report Status PENDING   Incomplete     Labs: Basic Metabolic Panel:  Recent Labs Lab 05/09/14 1004  05/10/14 0450  05/10/14 1250 05/11/14 0515 05/12/14 0620 05/13/14 0525 05/14/14 0458  NA  --   < > 139 134* 136* 135* 136* 140  K  --   < > 5.6* 5.2 4.7 4.2 4.0 4.0  CL  --   < > 106 102 103 101 103 105  CO2  --   < > 22 23 24 21 23 24   GLUCOSE  --   < > 168* 184* 119* 108* 109* 110*  BUN  --   < > 18 19 18 16 14 14   CREATININE  --   < > 1.25* 1.23* 1.21* 1.01 0.96 1.01  CALCIUM  --   < > 8.1* 8.4 8.4 8.6 8.5 8.5  MG 0.7*  --  2.2  --  1.9  --   --   --   PHOS 1.1*  --   --   --  3.0  --   --   --   < > = values in this interval not displayed. Liver Function Tests:  Recent Labs Lab 05/09/14 1005 05/10/14 0450  AST 6 9  ALT 5 7  ALKPHOS 54 88  BILITOT <0.2* 0.4  PROT 3.7* 6.4  ALBUMIN 1.1* 1.9*   No results found for this basename: LIPASE, AMYLASE,  in the last 168 hours No results found for this basename: AMMONIA,  in the last 168 hours CBC:  Recent Labs Lab 05/09/14 1005 05/09/14 2200 05/10/14 0450 05/11/14 0515 05/12/14 0620 05/14/14 0458  WBC 6.3  --  12.2* 11.1* 8.9 9.8  NEUTROABS 5.4  --   --   --  6.6  --   HGB 4.9* 10.7*  9.7* 9.4* 9.6* 9.1*  HCT 15.3* 32.2* 29.3* 28.2* 28.9* 28.8*  MCV 92.2  --  88.5 88.1 89.2 90.0  PLT 105*  --  200 184 184 196   Cardiac Enzymes: No results found for this basename: CKTOTAL, CKMB, CKMBINDEX, TROPONINI,  in the last 168 hours BNP: No components found with this basename: POCBNP,  CBG:  Recent Labs Lab 05/13/14 0850 05/13/14 1248 05/13/14 1613 05/13/14 2135 05/14/14 0713  GLUCAP 127* 141* 127* 87 112*    Time coordinating discharge:  Greater than 30 minutes  Signed:  Jayley Hustead, DO [facility] Pager: [phone] 05/14/2014, 9:23 AM

## 2014-05-16 NOTE — Progress Notes (Addendum)
Patient ID: Kelly [name], female   DOB: [birth year], 68 y.o.   MRN: [id number]               PROGRESS NOTE  DATE:  05/07/2014    FACILITY: [facility]    LEVEL OF CARE:   SNF   Acute Visit   CHIEF COMPLAINT:  Review of wounds.    HISTORY OF PRESENT ILLNESS:  This is a patient whom I only know superficially in the facility.   She follows carefully with Dr. Theodoro Kos at the [facility]. She has an extensive surgical wound in her left medial thigh for which she has been receiving a wound VAC for many months now.  As mentioned, she follows carefully at the [facility].     The facility asked me to look at this today as the wound VAC would no longer stay attached when the patient got up to walk or even when she was moving in bed.  I contacted Dr. Migdalia Dk about this.  We have agreed to discontinue the wound VAC at this point.  Dr. Migdalia Dk suggested a collagen-based dressing.    The patient also has a painful pruritic area on her left great toe and in the first and second toe interspace.    Finally, over a large area of pannus on the posterior aspect of her right thigh, there are superficial wounds there, as well.    PHYSICAL EXAMINATION:   SKIN:  INSPECTION:  The surgical wound on the inner aspect of the left thigh does not look bad at all and it certainly improved dramatically with the [facility] as directed by Dr. Migdalia Dk.  The area was anesthetized with topical lidocaine and debrided with a curette.   There is a fair amount of surface eschar.  I suspect she is going to need further debridements, both autolytic and mechanical.    Over the left great toe is an area of erythema.  The epidermis is friable in this area.    On the posterior right thigh, there is a large pannus here with lymphedema.  She has some superficial wounds here.    ASSESSMENT/PLAN:  Surgical wound, nonhealing, inner aspect of the left thigh.  This is actually a lot better.  In discussion with  Dr. Migdalia Dk, we agreed to discontinue the wound VAC.  I debrided this.  We will use collagen/hydrogel covered with a large ABD pad.    Tinea pedis.  Cleaning and drying will be important here.  I have used ketoconazole 2% over this area.   Posterior right thigh wounds.   These do not require debridement.  We used collagen/hydrogel and an ABD pad in this area.  Ideally, I would like to wrap this or give her external compression pumps.   However, I think the wrapping is simply not feasible and external compression pumps will only be able to be entertained when she is at home.

## 2014-05-17 ENCOUNTER — Other Ambulatory Visit: Payer: Self-pay | Admitting: *Deleted

## 2014-05-17 ENCOUNTER — Non-Acute Institutional Stay (SKILLED_NURSING_FACILITY): Payer: Medicare Other | Admitting: Internal Medicine

## 2014-05-17 DIAGNOSIS — E1129 Type 2 diabetes mellitus with other diabetic kidney complication: Secondary | ICD-10-CM

## 2014-05-17 DIAGNOSIS — A419 Sepsis, unspecified organism: Secondary | ICD-10-CM

## 2014-05-17 DIAGNOSIS — N039 Chronic nephritic syndrome with unspecified morphologic changes: Secondary | ICD-10-CM

## 2014-05-17 DIAGNOSIS — N183 Chronic kidney disease, stage 3 unspecified: Secondary | ICD-10-CM

## 2014-05-17 DIAGNOSIS — R7881 Bacteremia: Secondary | ICD-10-CM

## 2014-05-17 DIAGNOSIS — D631 Anemia in chronic kidney disease: Secondary | ICD-10-CM

## 2014-05-17 DIAGNOSIS — A409 Streptococcal sepsis, unspecified: Secondary | ICD-10-CM

## 2014-05-17 MED ORDER — ZOLPIDEM TARTRATE 5 MG PO TABS: ORAL_TABLET | ORAL | Status: DC

## 2014-05-17 NOTE — Telephone Encounter (Signed)
[facility] 

## 2014-05-18 LAB — CULTURE, BLOOD (ROUTINE X 2)
Culture: NO GROWTH
Culture: NO GROWTH

## 2014-05-18 NOTE — Progress Notes (Signed)
HISTORY & PHYSICAL  DATE: 05/17/2014   FACILITY: [facility]  LEVEL OF CARE: SNF (31)  ALLERGIES:  Allergies  Allergen Reactions  . Bactrim [Sulfamethoxazole-Tmp Ds] Other (See Comments)    Hyperkalemia (July 2015)  . Penicillins Hives    CHIEF COMPLAINT:  Manage sepsis and bacteremia, diabetes mellitus and anemia of chronic kidney disease  HISTORY OF PRESENT ILLNESS: [name] was hospitalized secondary to sepsis and bacteremia. After hospitalization she is  readmitted to the facility for short-term rehabilitation and wound care.  SEPSIS/BACTEREMIA: Blood cultures grew 2 out of 2 Streptococcus. Source of bacteremia and sepsis was left lower extremity cellulitis. She was initially treated with IV vancomycin then switched to Levaquin per ID recommendation to complete 14 days of therapy.  ANEMIA: The anemia has been stable. The patient denies fatigue, melena or hematochezia. No complications from the medications currently being used. On admission patient's hemoglobin was 4.9. She was transfused 2 units of packed red blood cells after which hemoglobin keen to 9.4. Anemia is secondary to chronic kidney disease.  DM:pt's DM remains stable.  Pt denies polyuria, polydipsia, polyphagia, changes in vision or hypoglycemic episodes.  No complications noted from the medication presently being used.  Last hemoglobin A1c is: 6.5.  PAST MEDICAL HISTORY :  Past Medical History  Diagnosis Date  . Hypertension   . Hyperlipidemia   . CHF (congestive heart failure)   . GERD (gastroesophageal reflux disease)   . Depression   . Lymphedema of leg     left leg  . Pulmonary embolism 02/18/14    diagnosed at [facility] with CT angio chest  . Chronic indwelling Foley catheter   . Heart murmur     "just found out I have one" (04/07/2014)  . DVT (deep venous thrombosis) 01/2014    LLE; "went from my leg to my lungs"  . Pneumonia 1990's X 2  . Sleep apnea      doesn't use cpap machine or 02 at night (04/07/2014)  . Anemia   . History of blood transfusion 1990's X 2    "when they did my surgeries"  . Migraines     "none for years now" (04/07/2014)  . Arthritis     "arms, legs" (04/07/2014)  . Gout   . Urinary incontinence   . Chronic kidney disease (CKD), stage III (moderate)     Kelly [name] 04/07/2014  . Neuropathy   . Diabetes mellitus     "at one time" (04/07/2014)    PAST SURGICAL HISTORY: Past Surgical History  Procedure Laterality Date  . Shoulder open rotator cuff repair Right ~ 2000  . Knee arthroscopy Right ?1980's  . Temporal artery biopsy / ligation Right ?1990's  . Colonoscopy with propofol  09/23/2012    Procedure: COLONOSCOPY WITH PROPOFOL;  Surgeon: Jerene Bears, MD;  Location: [facility];  Service: Gastroenterology;  Laterality: N/A;  . Panniculectomy Left 02/08/2014    w/wound debridement @ medial thigh  . Irrigation and debridement abscess Left 02/24/2014    Procedure: MINOR INCISION AND DRAINAGE OF ABSCESS;  Surgeon: Theodoro Kos, DO;  Location: [facility];  Service: Plastics;  Laterality: Left;  . I&d extremity Left 03/03/2014    Procedure: IRRIGATION AND DEBRIDEMENT LEFT LEG WOUND AND PLACEMENT OF A CELL AND VAC;  Surgeon: Theodoro Kos, DO;  Location: [place];  Service: Plastics;  Laterality: Left;  . Incision and drainage of wound Left 03/16/2014    Procedure:  IRRIGATION AND DEBRIDEMENT OF LEFT THIGH WOUND, APPLICATION ACELL;  Surgeon: Irene Limbo, MD;  Location: [place];  Service: Plastics;  Laterality: Left;  . Incision and drainage of wound Left 03/25/2014    Procedure: IRRIGATION AND DEBRIDEMENT OF LEFT LEG WOUND WITH PLACEMENT OF ACELL/VAC;  Surgeon: Theodoro Kos, DO;  Location: [place];  Service: Plastics;  Laterality: Left;  . Incision and drainage of wound Left 03/31/2014    Procedure: IRRIGATION AND DEBRIDEMENT LEFT LEG WOUND WITH PLACEMENT OF A-CELL/VAC;  Surgeon: Theodoro Kos, DO;  Location: [place];  Service: Plastics;   Laterality: Left;  . Cataract extraction w/ intraocular lens implant Right 2014  . Vena cava filter placement  02/2014  . Incision and drainage of wound Left 04/07/2014    Procedure: IRRIGATION AND DEBRIDEMENT LEFT LEG WOUND WITH PLACEMENT OF A CELL AND VAC;  Surgeon: Theodoro Kos, DO;  Location: [place];  Service: Plastics;  Laterality: Left;  . Incision and drainage of wound Left 04/14/2014    Procedure: IRRIGATION AND DEBRIDEMENT WOUND;  Surgeon: Theodoro Kos, DO;  Location: [place];  Service: Plastics;  Laterality: Left;  . Application of a-cell of extremity Left 04/14/2014    Procedure: APPLICATION OF A-CELL OF EXTREMITY;  Surgeon: Theodoro Kos, DO;  Location: [place];  Service: Plastics;  Laterality: Left;  Marland Kitchen Minor application of wound vac Left 04/14/2014    Procedure: MINOR APPLICATION OF WOUND VAC;  Surgeon: Theodoro Kos, DO;  Location: [place];  Service: Plastics;  Laterality: Left;  . Incision and drainage of wound Left 04/21/2014    Procedure: IRRIGATION AND DEBRIDEMENT OF LEFT UPPER LEG WOUND WITH PLACEMENT OF ACELL/VAC;  Surgeon: Theodoro Kos, DO;  Location: [place];  Service: Plastics;  Laterality: Left;    SOCIAL HISTORY:  reports that she quit smoking about 32 years ago. Her smoking use included Cigarettes. She has a 33 pack-year smoking history. She has never used smokeless tobacco. She reports that she drinks alcohol. She reports that she does not use illicit drugs.  FAMILY HISTORY:  Family History  Problem Relation Age of Onset  . Emphysema Father   . Asthma Brother   . Heart disease Mother   . Stomach cancer Brother   . Heart disease Maternal Grandmother   . Diabetes Mother   . Diabetes Sister     CURRENT MEDICATIONS: Reviewed per MAR/see medication list  REVIEW OF SYSTEMS:  Cardiac-complains of chronic lower extremity swelling, See HPI otherwise 14 point ROS is negative.  PHYSICAL EXAMINATION  VS:  See VS section  GENERAL: no acute distress, morbidly obese body  habitus EYES: conjunctivae normal, sclerae normal, normal eye lids MOUTH/THROAT: lips without lesions,no lesions in the mouth,tongue is without lesions,uvula elevates in midline NECK: supple, trachea midline, no neck masses, no thyroid tenderness, no thyromegaly LYMPHATICS: no LAN in the neck, no supraclavicular LAN RESPIRATORY: breathing is even & unlabored, BS CTAB CARDIAC: RRR, no murmur,no extra heart sounds, left lower extremity +3 edema and right lower extremity +2 edema GI:  ABDOMEN: abdomen soft, normal BS, no masses, no tenderness  LIVER/SPLEEN: no hepatomegaly, no splenomegaly MUSCULOSKELETAL: HEAD: normal to inspection  EXTREMITIES: LEFT UPPER EXTREMITY: full range of motion, normal strength & tone RIGHT UPPER EXTREMITY:  full range of motion, normal strength & tone LEFT LOWER EXTREMITY: Minimal range of motion, normal strength & tone RIGHT LOWER EXTREMITY: Minimal range of motion, normal strength & tone PSYCHIATRIC: the patient is alert & oriented to person, affect & behavior appropriate  LABS/RADIOLOGY:  Labs reviewed: Basic Metabolic Panel:  Recent Labs  05/09/14 1004  05/10/14 0450  05/11/14 0515 05/12/14 0620 05/13/14 0525 05/14/14 0458  NA  --   < > 139  < > 136* 135* 136* 140  K  --   < > 5.6*  < > 4.7 4.2 4.0 4.0  CL  --   < > 106  < > 103 101 103 105  CO2  --   < > 22  < > 24 21 23 24   GLUCOSE  --   < > 168*  < > 119* 108* 109* 110*  BUN  --   < > 18  < > 18 16 14 14   CREATININE  --   < > 1.25*  < > 1.21* 1.01 0.96 1.01  CALCIUM  --   < > 8.1*  < > 8.4 8.6 8.5 8.5  MG 0.7*  --  2.2  --  1.9  --   --   --   PHOS 1.1*  --   --   --  3.0  --   --   --   < > = values in this interval not displayed. Liver Function Tests:  Recent Labs  02/20/14 2335 05/09/14 1005 05/10/14 0450  AST 23 6 9   ALT 25 5 7   ALKPHOS 153* 54 88  BILITOT 0.2* <0.2* 0.4  PROT 7.0 3.7* 6.4  ALBUMIN 1.9* 1.1* 1.9*   CBC:  Recent Labs  03/12/14 0800  05/09/14 1005   05/11/14 0515 05/12/14 0620 05/14/14 0458  WBC 6.2  < > 6.3  < > 11.1* 8.9 9.8  NEUTROABS 3.6  --  5.4  --   --  6.6  --   HGB 8.8*  < > 4.9*  < > 9.4* 9.6* 9.1*  HCT 27.6*  < > 15.3*  < > 28.2* 28.9* 28.8*  MCV 92.0  < > 92.2  < > 88.1 89.2 90.0  PLT 278  < > 105*  < > 184 184 196  < > = values in this interval not displayed.  CBG:  Recent Labs  05/13/14 1613 05/13/14 2135 05/14/14 0713  GLUCAP 127* 87 112*    Bilateral Lower Extremity Venous Duplex Evaluation  Patient:    Kelly [name], Kelly [name] MR #:       [id number] Study Date: 05/11/2014 Gender:     F Age:        66 Height: Weight: BSA: Pt. Status: Room:       [place], [place]     Thompson, Morrison, RVT, RDCS, RDMS  ATTENDING    Orson Eva [id number]  Reports also to:  ------------------------------------------------------------------- History and indications:  Indications  729.5 Pain in limb.  Cellulitis.  History  Diagnostic evaluation.  ------------------------------------------------------------------- Study information:  Study status:  Routine.  Procedure:  A vascular evaluation was performed with the patient in the supine position. The right common femoral, right femoral, right profunda femoral, right popliteal, right peroneal, right posterior tibial, and left common femoral veins were studied. Image quality was poor. The study was technically limited due to body habitus, edema, and surgical dressings.    Bilateral lower extremity venous duplex evaluation.   Doppler flow study including B-mode compression maneuvers of all visualized segments, color flow Doppler and selected views of pulsed wave Doppler.  Birthdate:  Patient birthdate: [birth year]. Age:  Patient is 68 yr old.  Sex:  Gender: female.  Study date: Study date: 05/11/2014. Study time: 02:46 PM.  Location:  Bedside. Patient status:   Inpatient.  Venous flow:  +--------------------+-------------------+------------------------+ Location            Overall            Flow properties          +--------------------+-------------------+------------------------+ Right common femoralPatent             Phasic; spontaneous;                                            compressible             +--------------------+-------------------+------------------------+ Right femoral       Not all segments   Compressible                                 well visualized                             +--------------------+-------------------+------------------------+ Right profunda      Patent             Compressible             femoral                                                         +--------------------+-------------------+------------------------+ Right popliteal     Patent             Phasic; spontaneous;                                            compressible             +--------------------+-------------------+------------------------+ Right posterior     Not all segments   Compressible             tibial              well visualized                             +--------------------+-------------------+------------------------+ Right peroneal      Not all segments   Compressible                                 well visualized                             +--------------------+-------------------+------------------------+ Right saphenofemoralPatent             Compressible             junction                                                        +--------------------+-------------------+------------------------+  Left common femoral Patent             Phasic; spontaneous;                                            compressible             +--------------------+-------------------+------------------------+ Left femoral        Not visualized      ------------------------ +--------------------+-------------------+------------------------+ Left profunda       Not visualized     ------------------------ femoral                                                         +--------------------+-------------------+------------------------+ Left popliteal      Not visualized     ------------------------ +--------------------+-------------------+------------------------+ Left posterior      Not visualized     ------------------------ tibial                                                          +--------------------+-------------------+------------------------+ Left peroneal       Not visualized     ------------------------ +--------------------+-------------------+------------------------+ Left saphenofemoral Patent             Compressible             junction                                                        +--------------------+-------------------+------------------------+  ------------------------------------------------------------------- Summary:  - Study was very technically limited due to patient body habitus,   depth of vessels, and bandages. No evidence of deep vein   thrombosis involving the visualized veins of the right lower   extremity and left lower extremity. - No evidence of Baker&'s cyst on the right. Unable to assess for   Baker&'s cyst on the left due to bandaging. CT OF THE LOWER BILATERAL EXTREMITY WITH CONTRAST   TECHNIQUE: Multidetector CT imaging of the lower bilateral extremities was performed according to the standard protocol following intravenous contrast administration.   COMPARISON:  None.   CONTRAST:  137mL OMNIPAQUE IOHEXOL 300 MG/ML  SOLN   FINDINGS: There is extensive subcutaneous edema throughout the left lower extremity. No acute abnormality of the bones. There are slight degenerative changes at both hips and there is moderately severe arthritis of the  left knee. No osteomyelitis. No joint effusions.   There is no evidence of a soft tissue abscess. There is similar but less extensive subcutaneous edema in the medial aspect of the right thigh.   There is a focal retracted area in the skin surface of the medial aspect of the left thigh consistent with the wound.   Incidental note is made of multiple small dense nodular areas in the subcutaneous fat of the panniculus consistent with injection granulomas.   IMPRESSION: Extensive subcutaneous edema throughout the left lower extremity with a soft  tissue wound in the medial aspect of the left thigh. There is no abscess, osteomyelitis, joint effusion, or myositis.     PORTABLE CHEST - 1 VIEW 5:22 p.m.   COMPARISON:  05/09/2014 at 9:54 a.m.   FINDINGS: Right jugular vein catheter has been inserted and the tip is in the superior vena cava above the right atrium in good position. Heart size is within normal limits. Slight pulmonary vascular prominence. Peribronchial thickening as noted previously. No infiltrates or effusions.   IMPRESSION: Central line in good position. No pneumothorax. Persistent peribronchial thickening and slight pulmonary vascular prominence.    Transthoracic Echocardiography  Patient:    Kelly [name], Kelly [name] MR #:       [id number] Study Date: 11/28/2012 Gender:     F Age:        19 Height:     165.1cm Weight:     168.2kg BSA:        2.5m^2 Pt. Status: Room:    ORDERING     Nahser, Jasmine Awe    Nahser, Kathlen Mody  SONOGRAPHER  Cindy Hazy, RDCS  PERFORMING   [facility], [facility] cc:  ------------------------------------------------------------ LV EF: 55% -   60%  ------------------------------------------------------------ Indications:      CHF - 428.0.  ------------------------------------------------------------ History:   PMH:  Acquired from the patient and from the patient's chart.  PMH:  CHF. Pulmonary  hypertension. Acute Renal Failure. Lower extremity edema. Palpitations. Chest pain. Obesity hypoventilation syndrome. Sleep apnea-CPAP. Risk factors:  Former tobacco use. Hypertension. Diabetes mellitus. Morbidly obese. Dyslipidemia.  ------------------------------------------------------------ Study Conclusions  - Procedure narrative: Transthoracic echocardiography for   left ventricular function evaluation. Image quality was   adequate. The study was technically difficult. - Left ventricle: The cavity size was normal. Wall thickness   was increased in a pattern of mild LVH. Systolic function   was normal. The estimated ejection fraction was in the   range of 55% to 60%. Wall motion was normal; there were no   regional wall motion abnormalities. Doppler parameters are   consistent with abnormal left ventricular relaxation   (grade 1 diastolic dysfunction). - Left atrium: The atrium was mildly dilated. Transthoracic echocardiography.  M-mode, complete 2D, spectral Doppler, and color Doppler.  Height:  Height: 165.1cm. Height: 65in.  Weight:  Weight: 168.2kg. Weight: 370lb.  Body mass index:  BMI: 61.7kg/m^2.  Body surface area:    BSA: 2.11m^2.  Blood pressure:     134/76.  Patient status:  Outpatient.  Location:  [facility]  ------------------------------------------------------------  ------------------------------------------------------------ Left ventricle:  The cavity size was normal. Wall thickness was increased in a pattern of mild LVH. Systolic function was normal. The estimated ejection fraction was in the range of 55% to 60%. Wall motion was normal; there were no regional wall motion abnormalities. Doppler parameters are consistent with abnormal left ventricular relaxation (grade 1 diastolic dysfunction).  ------------------------------------------------------------ Aortic valve:   Trileaflet; mildly thickened leaflets. Mobility was not restricted.  Doppler:   Transvalvular velocity was within the normal range. There was no stenosis.  No regurgitation.  ------------------------------------------------------------ Aorta:  Aortic root: The aortic root was normal in size.  ------------------------------------------------------------ Mitral valve:   Structurally normal valve.   Mobility was not restricted.  Doppler:  Transvalvular velocity was within the normal range. There was no evidence for stenosis.  No regurgitation.    Peak gradient: 75mm Hg (D).  ------------------------------------------------------------ Left atrium:  The atrium was mildly dilated.  ------------------------------------------------------------  Right ventricle:  The cavity size was normal. Systolic function was normal.  ------------------------------------------------------------ Pulmonic valve:    Doppler:  Transvalvular velocity was within the normal range. There was no evidence for stenosis.   ------------------------------------------------------------ Tricuspid valve:   Structurally normal valve.    Doppler: Transvalvular velocity was within the normal range.  Trivial regurgitation.  ------------------------------------------------------------ Right atrium:  The atrium was normal in size.  ------------------------------------------------------------ Pericardium:  There was no pericardial effusion.  ------------------------------------------------------------  2D measurements        Normal  Doppler measurements   Normal Left ventricle                 Left ventricle LVID ED,   46.5 mm     43-52   Ea, lat     10.6 cm/s  ------ chord,                         ann, tiss PLAX                           DP LVID ES,   28.5 mm     23-38   E/Ea, lat  10.38       ------ chord,                         ann, tiss PLAX                           DP FS, chord,   39 %      >29     Ea, med     8.22 cm/s  ------ PLAX                           ann, tiss LVPW, ED   12.9 mm      ------  DP IVS/LVPW      1        <1.3    E/Ea, med  13.38       ------ ratio, ED                      ann, tiss Ventricular septum             DP IVS, ED    12.9 mm     ------  LVOT LVOT                           Peak vel,    137 cm/s  ------ Diam, S      19 mm     ------  S Area       2.84 cm^2   ------  VTI, S      31.8 cm    ------ Diam         19 mm     ------  Peak           8 mm Hg ------ Aorta                          gradient, Root diam,   32 mm     ------  S ED  Stroke vol  90.2 ml    ------ Left atrium                    Stroke      31.2 ml/m^ ------ AP dim       48 mm     ------  index            2 AP dim     1.66 cm/m^2 <2.2    Mitral valve index                          Peak E vel   110 cm/s  ------                                Peak A vel   117 cm/s  ------                                Decelerati   254 ms    150-23                                on time                0                                Peak           5 mm Hg ------                                gradient,                                D                                Peak E/A     0.9       ------                                ratio                                Right ventricle                                Sa vel,     19.6 cm/s  ------                                lat ann,                                tiss DP    ASSESSMENT/PLAN:  Sepsis and Bacteremia-continue Levaquin for 14 days. Diabetes mellitus with renal complications-well-controlled Anemia of chronic kidney disease-status post transfusion. Continue iron. Recheck  hemoglobin. Left lower extremity cellulitis-continue Levaquin  Chronic kidney disease stage III-recheck renal functions Renovascular hypertension-blood pressure borderline. Will monitor. CHF-compensated History of DVT/PE-status post filter placement. Check CBC and BMP  I have reviewed patient's medical records received at admission/from  hospitalization.  CPT CODE: 41423  Gayani Y Dasanayaka, [place] [phone]

## 2014-05-19 ENCOUNTER — Non-Acute Institutional Stay (SKILLED_NURSING_FACILITY): Payer: Medicare Other | Admitting: Internal Medicine

## 2014-05-19 DIAGNOSIS — D631 Anemia in chronic kidney disease: Secondary | ICD-10-CM

## 2014-05-19 DIAGNOSIS — N183 Chronic kidney disease, stage 3 unspecified: Secondary | ICD-10-CM

## 2014-05-19 DIAGNOSIS — N039 Chronic nephritic syndrome with unspecified morphologic changes: Secondary | ICD-10-CM

## 2014-05-19 NOTE — Progress Notes (Signed)
Agree with the above

## 2014-05-21 ENCOUNTER — Non-Acute Institutional Stay (SKILLED_NURSING_FACILITY): Payer: Medicare Other | Admitting: Internal Medicine

## 2014-05-21 DIAGNOSIS — R609 Edema, unspecified: Secondary | ICD-10-CM

## 2014-05-24 NOTE — Progress Notes (Signed)
Patient ID: Kelly [name], female   DOB: 05/15/1946, 68 y.o.   MRN: [id number]           PROGRESS NOTE  DATE: 05/19/2014          FACILITY:  [facility]  LEVEL OF CARE: SNF (31)  Acute Visit  CHIEF COMPLAINT:  Manage anemia of chronic kidney disease and chronic kidney disease.     HISTORY OF PRESENT ILLNESS: I was requested by the staff to assess the patient regarding above problem(s):  ANEMIA: The anemia has been stable. The patient denies fatigue, melena or hematochezia. No complications from the medications currently being used.   On 05/18/2014:  Hemoglobin 10.2, MCV 90.  In 03/2014:  Hemoglobin 8.1.    CHRONIC KIDNEY DISEASE: The patient's chronic kidney disease remains stable.  Patient denies increasing lower extremity swelling or confusion. Last BUN and creatinine are:   On 05/18/2014:  BUN 16, creatinine 1.07.  On 05/04/2014:  Creatinine 1.13.  Patient has a history of chronic kidney disease stage III.    PAST MEDICAL HISTORY : Reviewed.  No changes/see problem list  CURRENT MEDICATIONS: Reviewed per MAR/see medication list  REVIEW OF SYSTEMS:  GENERAL: no change in appetite, no fatigue, no weight changes, no fever, chills or weakness RESPIRATORY: no cough, SOB, DOE,, wheezing, hemoptysis CARDIAC: no chest pain or palpitations;  chronic lower extremity swelling        GI: no abdominal pain, diarrhea, constipation, heart burn, nausea or vomiting  PHYSICAL EXAMINATION  VS: see VS section  GENERAL: no acute distress, morbidly obese body habitus NECK: supple, trachea midline, no neck masses, no thyroid tenderness, no thyromegaly RESPIRATORY: breathing is even & unlabored, BS CTAB CARDIAC: RRR, no murmur,no extra heart sounds, left lower extremity +3 edema, right lower extremity +2 edema          GI: abdomen soft, normal BS, no masses, no tenderness, no hepatomegaly, no splenomegaly PSYCHIATRIC: the patient is alert & oriented to person, affect & behavior  appropriate  ASSESSMENT/PLAN:  Anemia of chronic kidney disease.  Hemoglobin improved.     Chronic kidney disease stage III.  Renal functions improved.    CPT CODE: 72094          Gayani Y Dasanayaka, [place] [phone]

## 2014-05-25 NOTE — Progress Notes (Addendum)
Patient ID: Kelly [name], female   DOB: 05/14/1946, 68 y.o.   MRN: [id number]               PROGRESS NOTE  DATE:  05/21/2014    FACILITY: [facility]    LEVEL OF CARE:   SNF   Acute Visit   HISTORY OF PRESENT ILLNESS:  Kelly [name] is a patient who has been predominantly in the facility for treatment of a nonhealing surgical wound on the left posterior thigh, although this has been improving.    She was recently admitted to hospital from 05/09/2014 through 05/14/2014 with 2/2 blood cultures growing Streptococcus.  The source of this was felt to be secondary to a distal left lower extremity cellulitis (not associated with the actual wound).  She received vancomycin and Levaquin 500 for eight more days after her arrival here to complete 14 days' worth of antibiotic therapy.    She also has a history of a DVT in the left leg.  She has a filter in place that was put in at [facility].  She is on Eliquis 5 mg b.i.d., in addition.    I had previously seen her on 03/26/2014 and was quite convinced she had a DVT in the left leg, although a duplex ultrasound was negative.    I was called in to see her today as she has noticed increasing pain and swelling in the left leg.    REVIEW OF SYSTEMS:   GENERAL:  The patient states she has pain in the left leg, but otherwise is systemically well with no fever.   CHEST/RESPIRATORY:  No shortness of breath.     PHYSICAL EXAMINATION:   SKIN:   INSPECTION:  Extremities:  Her left posterior wound is actually doing very well.  There is significant discoloration.  Some swelling, warmth, and tenderness of the left leg diffusely.   There is also significant demarcation in the skin just proximal to the base of her toes dorsally in terms of color, although this is not the area where she is most tender.    ASSESSMENT/PLAN:  Left leg swelling, warmth, and tenderness.  It would be difficult clinically to sort this out from recurrent cellulitis, superficial  thrombophlebitis, and a deep venous thrombosis.  The deep venous thrombosis angle is made less likely by the fact that she has oral anticoagulation with Eliquis and an IVC filter in place.   Also, I had seen this leg almost two months ago in the same type of condition, at which time a duplex ultrasound was negative.  The possibility of concomitant cellulitis exists even though she is coming off Levaquin.  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    I am going to continue the Levaquin.  I would like to keep an eye on this leg.  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

## 2014-05-26 ENCOUNTER — Non-Acute Institutional Stay (SKILLED_NURSING_FACILITY): Payer: Medicare Other | Admitting: Internal Medicine

## 2014-05-26 DIAGNOSIS — L03119 Cellulitis of unspecified part of limb: Secondary | ICD-10-CM

## 2014-05-26 DIAGNOSIS — L03116 Cellulitis of left lower limb: Secondary | ICD-10-CM

## 2014-05-26 DIAGNOSIS — L02419 Cutaneous abscess of limb, unspecified: Secondary | ICD-10-CM

## 2014-05-27 ENCOUNTER — Encounter: Payer: Self-pay | Admitting: Internal Medicine

## 2014-05-27 ENCOUNTER — Non-Acute Institutional Stay (SKILLED_NURSING_FACILITY): Payer: Medicare Other | Admitting: Internal Medicine

## 2014-05-27 DIAGNOSIS — M79605 Pain in left leg: Secondary | ICD-10-CM

## 2014-05-27 DIAGNOSIS — R609 Edema, unspecified: Secondary | ICD-10-CM

## 2014-05-27 NOTE — Progress Notes (Signed)
This encounter was created in error - please disregard.  This encounter was created in error - please disregard.

## 2014-05-27 NOTE — Progress Notes (Signed)
Patient ID: Kelly [name], female   DOB: 1945-09-22, 68 y.o.   MRN: [id number]               PROGRESS NOTE  DATE:  05/27/2014   FACILITY: [facility]    LEVEL OF CARE:   SNF   Acute Visit  Chief complaint; continued pain and swelling in the left distal leg   HISTORY OF PRESENT ILLNESS:  Kelly [name] is a patient who has been predominantly in the facility for treatment of a nonhealing surgical wound on the left posterior thigh, although this has been improving.    She was recently admitted to hospital from 05/09/2014 through 05/14/2014 with 2/2 blood cultures growing Streptococcus.  The source of this was felt to be secondary to a distal left lower extremity cellulitis (not associated with the actual wound).  She received vancomycin and Levaquin 500 for eight more days after her arrival here to complete 14 days' worth of antibiotic therapy.  She had a CT scan of the left leg done before she left the hospital, and this did not show evidence of abscess, osteomyelitis, myositis. It did show diffuse edema.  She also has a history of a DVT in the left leg.  She has a filter in place that was put in at [facility].  She is on Eliquis 5 mg b.i.d., in addition.    I had previously seen her on 03/26/2014 and was quite convinced she had a DVT in the left leg, although a duplex ultrasound was negative.  I saw her again for this problem last week I extended the Levaquin for a further 5 days. She was seen by our service yesterday with 5 more days of Levaquin blood cultures were ordered. CBC that I had ordered last week from 9/30 showed a white count of 5.3 with normal differential. BUN of 14 creatinine of 1.13 her total CO2 was 22     REVIEW OF SYSTEMS:   GENERAL:  The patient states she has pain in the left leg, but otherwise is systemically well with no fever.   CHEST/RESPIRATORY:  No shortness of breath.     PHYSICAL EXAMINATION:   SKIN:   Left leg; patient has chronic lymphedema and also chronic  venous stasis. Her left leg is much darker than the right normal the patient makes it sound like something subacute I think this is probably similar to what I saw at the end of July. Her surgical wound on the inner aspect of her left thigh it looks quite good there is no evidence of infection around this there is tight swelling of the left distal leg especially in the lateral aspect and this is tender yet there is no further evidence of cellulitis. Her dorsal left foot is swollen and somewhat tender but again the foot does not appear to be threatened either from an infectious or vascular viewpoint  ASSESSMENT/PLAN:  Left leg swelling, warmth, and tenderness.  Once again I find these complaints difficult to sort through. She had a CT scan of the leg done in the hospital [see below]. Her white count and differential are normal. Her hemoglobin is low at 9.9 however if anything her hemoglobin is higher than it was earlier in this admission [not indicative of bleeding into the leg]. She has a history of left leg DVT although she is on Elaquis and has an IVC filter. I previously wondered whether this was superficial phlebitis in the setting of chronic venous insufficiency and lymphedema. I am  going to repeat arterial and venous Dopplers of the left leg. If her arterial Dopplers are satisfactory I would like to consider wrapping the left leg. I also ordered a sedimentation rate CK for next week 2. Blood cultures have already been done which seems recent  I am going to continue the Levaquin, which had already been continued yesterday by our service. I am of the opinion however that this is probably not infectious at least not currently.   EXAM: CT OF THE LOWER BILATERAL EXTREMITY WITH CONTRAST   TECHNIQUE: Multidetector CT imaging of the lower bilateral extremities was performed according to the standard protocol following intravenous contrast administration.   COMPARISON:  None.   CONTRAST:  1110mL  OMNIPAQUE IOHEXOL 300 MG/ML  SOLN   FINDINGS: There is extensive subcutaneous edema throughout the left lower extremity. No acute abnormality of the bones. 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 No osteomyelitis. No joint effusions.   There is no evidence of a soft tissue abscess. There is similar but less extensive subcutaneous edema in the medial aspect of the right thigh.   There is a focal retracted area in the skin surface of the medial aspect of the left thigh consistent with the wound.   Incidental note is made of multiple small dense nodular areas in the subcutaneous fat of the panniculus consistent with injection granulomas.   IMPRESSION: Extensive subcutaneous edema throughout the left lower extremity with a soft tissue wound in the medial aspect of the left thigh. There is no abscess, osteomyelitis, joint effusion, or myositis.     Electronically Signed   By: Vanita Ingles.D.

## 2014-05-31 NOTE — Progress Notes (Signed)
Patient ID: Kelly [name], female   DOB: 1945-09-22, 68 y.o.   MRN: [id number]           PROGRESS NOTE  DATE: 05/26/2014          FACILITY:  [facility]  LEVEL OF CARE: SNF (31)  Acute Visit  CHIEF COMPLAINT:  Manage increasing left lower extremity pain.     HISTORY OF PRESENT ILLNESS: I was requested by the staff to assess the patient regarding above problem(s):  Staff report that patient is complaining of increased pain to lower part of left lower extremity with some burning and stabbing sensation.  The area is warm to touch with increased discoloration.  The patient is currently on Levaquin, but finishing with one more dose.  She denies fever, chills or night sweats.  Patient cannot identify precipitating or alleviating factors.  There is no temporal relationship.    PAST MEDICAL HISTORY : Reviewed.  No changes/see problem list  CURRENT MEDICATIONS: Reviewed per MAR/see medication list  REVIEW OF SYSTEMS:  GENERAL: no change in appetite, no fatigue, no weight changes, no fever, chills or weakness RESPIRATORY: no cough, SOB, DOE,, wheezing, hemoptysis CARDIAC: no chest pain or palpitations;  chronic lower extremity swelling     GI: no abdominal pain, diarrhea, constipation, heart burn, nausea or vomiting  PHYSICAL EXAMINATION  VS: see VS section  GENERAL: no acute distress, morbidly obese body habitus EYES: conjunctivae normal, sclerae normal, normal eye lids NECK: supple, trachea midline, no neck masses, no thyroid tenderness, no thyromegaly LYMPHATICS: no LAN in the neck, no supraclavicular LAN RESPIRATORY: breathing is even & unlabored, BS CTAB CARDIAC: RRR, no murmur,no extra heart sounds, left lower extremity +4 edema, right lower extremity +2 edema      GI: abdomen soft, normal BS, no masses, no tenderness, no hepatomegaly, no splenomegaly PSYCHIATRIC: the patient is alert & oriented to person, affect & behavior appropriate SKIN:  INSPECTION: lower  part of left lower extremity warm to touch, exquisitely tender to palpation, and there is dark discoloration      ASSESSMENT/PLAN:  Left lower extremity cellulitis.   Ongoing problem.  We will extend Levaquin for five more days.  Add probiotics b.i.d. for 10 days.  Obtain blood culture x2.  Also, obtain arterial doppler of the left foot.       CPT CODE: 27782            [name], [place] [phone]

## 2014-06-02 ENCOUNTER — Non-Acute Institutional Stay (SKILLED_NURSING_FACILITY): Payer: Medicare Other | Admitting: Internal Medicine

## 2014-06-02 DIAGNOSIS — D631 Anemia in chronic kidney disease: Secondary | ICD-10-CM

## 2014-06-02 DIAGNOSIS — N183 Chronic kidney disease, stage 3 unspecified: Secondary | ICD-10-CM

## 2014-06-07 ENCOUNTER — Non-Acute Institutional Stay (SKILLED_NURSING_FACILITY): Payer: Medicare Other | Admitting: Internal Medicine

## 2014-06-07 DIAGNOSIS — E0843 Diabetes mellitus due to underlying condition with diabetic autonomic (poly)neuropathy: Secondary | ICD-10-CM

## 2014-06-07 DIAGNOSIS — N183 Chronic kidney disease, stage 3 unspecified: Secondary | ICD-10-CM

## 2014-06-07 DIAGNOSIS — I5032 Chronic diastolic (congestive) heart failure: Secondary | ICD-10-CM

## 2014-06-07 DIAGNOSIS — K5900 Constipation, unspecified: Secondary | ICD-10-CM

## 2014-06-07 NOTE — Progress Notes (Signed)
         PROGRESS NOTE  DATE: 06-07-14  FACILITY: [facility] Home Location: [facility]  LEVEL OF CARE: SNF (31)  Routine Visit  CHIEF COMPLAINT:  Manage constipation, neuropathy and CHF  HISTORY OF PRESENT ILLNESS:  REASSESSMENT OF ONGOING PROBLEM(S):  CHF:The patient does not relate significant weight changes, denies orthopnea, PNDs, palpitations or chest pain.  CHF is unstable.  No complications form the medications being used. Complains of chronic lower extremity swelling.  CONSTIPATION: The constipation is stable. No complications from the medications presently being used. Patient denies ongoing constipation, abdominal pain, nausea or vomiting.  PERIPHERAL NEUROPATHY: The peripheral neuropathy is stable. The patient denies pain in the feet, tingling, and numbness. No complications noted from the medication presently being used.  PAST MEDICAL HISTORY : Reviewed.  No changes/see problem list  CURRENT MEDICATIONS: Reviewed per MAR/see medication list  REVIEW OF SYSTEMS:  GENERAL: no change in appetite, no fatigue, no weight changes, no fever, chills or weakness RESPIRATORY: no cough, wheezing, hemoptysis CARDIAC: no chest pain,  or palpitations, complains of chronic lower extremity swelling GI: no abdominal pain, diarrhea, constipation, heart burn, nausea or vomiting  PHYSICAL EXAMINATION  VS:  See VS section  GENERAL: no acute distress, morbidly obese body habitus EYES: conjunctivae normal, sclerae normal, normal eye lids NECK: supple, trachea midline, no neck masses, no thyroid tenderness, no thyromegaly LYMPHATICS: no LAN in the neck, no supraclavicular LAN RESPIRATORY: breathing is even & unlabored, BS CTAB CARDIAC: RRR, no murmur,no extra heart sounds, +3 left lower extremity edema, +2 right lower extremity edema GI: abdomen soft, normal BS, no masses, no tenderness, no hepatomegaly, no splenomegaly PSYCHIATRIC: the patient is alert & oriented  to person, affect & behavior appropriate  LABS/RADIOLOGY: 10-15 hemoglobin 9.8, MCV 87 otherwise CBC normal 9-15 BUN 20, creatinine 1.13, glucose 113 otherwise BMP normal, HDL 37 otherwise FLP normal, hemoglobin A1c 6.5 8-15 creatinine 1.13 otherwise BMP normal, hemoglobin 8.1, MCV 91 otherwise CBC normal, total protein 5.7, albumin 2.4, alkaline phosphatase 161 otherwise liver profile normal, magnesium level 1.7 7-15 hemoglobin 8.2, MCV 91 otherwise CBC normal, BMP normal  ASSESSMENT/PLAN:  Constipation-well controlled Neuropathy-continue Neurontin CHF-compensated Chronic kidney disease-renal functions improved.  Hypokalemia-continue supplementation CAD-stable GERD-continue Nexium Gout-continue allopurinol Hyperlipidemia-well controlled Hypomagnesemia-continue supplementation. Well repleted. Diabetes mellitus-. Diet controlled  CPT CODE: 54650  Evangelene Vora Y Jannett Schmall, [place] [phone]

## 2014-06-09 ENCOUNTER — Non-Acute Institutional Stay (SKILLED_NURSING_FACILITY): Payer: Medicare Other | Admitting: Internal Medicine

## 2014-06-09 DIAGNOSIS — K5900 Constipation, unspecified: Secondary | ICD-10-CM

## 2014-06-09 DIAGNOSIS — E0843 Diabetes mellitus due to underlying condition with diabetic autonomic (poly)neuropathy: Secondary | ICD-10-CM

## 2014-06-09 DIAGNOSIS — N183 Chronic kidney disease, stage 3 unspecified: Secondary | ICD-10-CM

## 2014-06-09 DIAGNOSIS — I5032 Chronic diastolic (congestive) heart failure: Secondary | ICD-10-CM

## 2014-06-09 NOTE — Progress Notes (Signed)
Patient ID: Kelly [name], female   DOB: 05-31-1946, 68 y.o.   MRN: [id number]           PROGRESS NOTE  DATE: 06/02/2014         FACILITY:  [facility]  LEVEL OF CARE: SNF (31)  Acute Visit  CHIEF COMPLAINT:  Manage anemia of chronic kidney disease.    HISTORY OF PRESENT ILLNESS: I was requested by the staff to assess the patient regarding above problem(s):  ANEMIA: The anemia has been stable. The patient denies fatigue, melena or hematochezia. No complications from the medications currently being used.  On 05/31/2014:  Hemoglobin 9.8, MCV 87.  On 05/26/2014:  Hemoglobin 9.9.    PAST MEDICAL HISTORY : Reviewed.  No changes/see problem list  CURRENT MEDICATIONS: Reviewed per MAR/see medication list  PHYSICAL EXAMINATION  VS: see VS section  GENERAL: no acute distress, morbidly obese body habitus RESPIRATORY: breathing is even & unlabored, BS CTAB CARDIAC: RRR, no murmur,no extra heart sounds, left lower extremity has +4 edema, right lower extremity has +2 edema.    ASSESSMENT/PLAN:  Anemia of chronic kidney disease.  Hemoglobin stable.    CPT CODE: 29518           Gayani Y Dasanayaka, [place] [phone]

## 2014-06-10 NOTE — Progress Notes (Signed)
         PROGRESS NOTE  DATE: 06-09-14  FACILITY: [facility] Home Location: [facility]  LEVEL OF CARE: SNF (31)  Discharge Visit  CHIEF COMPLAINT:  Manage constipation, neuropathy and CHF  HISTORY OF PRESENT ILLNESS: I was requested by the social worker to perform face-to-face evaluation for discharge on 06-11-14.  REASSESSMENT OF ONGOING PROBLEM(S):  CHF:The patient does not relate significant weight changes, denies orthopnea, PNDs, palpitations or chest pain.  CHF is unstable.  No complications form the medications being used. Complains of chronic lower extremity swelling.  CONSTIPATION: The constipation is stable. No complications from the medications presently being used. Patient denies ongoing constipation, abdominal pain, nausea or vomiting.  PERIPHERAL NEUROPATHY: The peripheral neuropathy is stable. The patient denies pain in the feet, tingling, and numbness. No complications noted from the medication presently being used.  PAST MEDICAL HISTORY : Reviewed.  No changes/see problem list  CURRENT MEDICATIONS: Reviewed per MAR/see medication list  REVIEW OF SYSTEMS:  GENERAL: no change in appetite, no fatigue, no weight changes, no fever, chills or weakness RESPIRATORY: no cough, wheezing, hemoptysis CARDIAC: no chest pain,  or palpitations, complains of chronic lower extremity swelling GI: no abdominal pain, diarrhea, constipation, heart burn, nausea or vomiting  PHYSICAL EXAMINATION  VS:  See VS section  GENERAL: no acute distress, morbidly obese body habitus EYES: conjunctivae normal, sclerae normal, normal eye lids NECK: supple, trachea midline, no neck masses, no thyroid tenderness, no thyromegaly LYMPHATICS: no LAN in the neck, no supraclavicular LAN RESPIRATORY: breathing is even & unlabored, BS CTAB CARDIAC: RRR, no murmur,no extra heart sounds, +3 left lower extremity edema, +2 right lower extremity edema GI: abdomen soft, normal BS, no  masses, no tenderness, no hepatomegaly, no splenomegaly PSYCHIATRIC: the patient is alert & oriented to person, affect & behavior appropriate  LABS/RADIOLOGY: 10-15 hemoglobin 9.8, MCV 87 otherwise CBC normal 9-15 BUN 20, creatinine 1.13, glucose 113 otherwise BMP normal, HDL 37 otherwise FLP normal, hemoglobin A1c 6.5 8-15 creatinine 1.13 otherwise BMP normal, hemoglobin 8.1, MCV 91 otherwise CBC normal, total protein 5.7, albumin 2.4, alkaline phosphatase 161 otherwise liver profile normal, magnesium level 1.7 7-15 hemoglobin 8.2, MCV 91 otherwise CBC normal, BMP normal  ASSESSMENT/PLAN:  Constipation-well controlled Neuropathy-continue Neurontin CHF-compensated Chronic kidney disease-renal functions improved.  Hypokalemia-continue supplementation CAD-stable GERD-continue Nexium Gout-continue allopurinol Hyperlipidemia-well controlled Hypomagnesemia-continue supplementation. Well repleted. Diabetes mellitus-. Diet controlled  I have filled out patient's discharge paperwork and written prescriptions. Patient will receive home health PT, OT, RN and CNA DME: None  Discharge time involved coordination of discharge process with nursing staff, physical therapy department and social worker.  CPT CODE: 73710  Tavarus Poteete Y Chaundra Abreu, [place] [phone]

## 2014-07-01 ENCOUNTER — Ambulatory Visit (INDEPENDENT_AMBULATORY_CARE_PROVIDER_SITE_OTHER): Payer: Medicare Other | Admitting: Podiatry

## 2014-07-01 VITALS — BP 151/91 | HR 115 | Resp 20

## 2014-07-01 DIAGNOSIS — R600 Localized edema: Secondary | ICD-10-CM

## 2014-07-01 DIAGNOSIS — R609 Edema, unspecified: Secondary | ICD-10-CM

## 2014-07-01 DIAGNOSIS — M79676 Pain in unspecified toe(s): Secondary | ICD-10-CM

## 2014-07-01 DIAGNOSIS — L309 Dermatitis, unspecified: Secondary | ICD-10-CM

## 2014-07-01 DIAGNOSIS — B351 Tinea unguium: Secondary | ICD-10-CM

## 2014-07-01 DIAGNOSIS — I2510 Atherosclerotic heart disease of native coronary artery without angina pectoris: Secondary | ICD-10-CM

## 2014-07-01 NOTE — Progress Notes (Signed)
She presents today for chief complaint of painfully elongated toenails 1 through 5 bilateral. She also complains of a rash and swelling to her left lower extremity.  Objective: Vital signs demonstrate hypertension and tachycardia. Pulses are palpable bilateral severe edema bilateral nails are thick yellow dystrophic onychomycotic. Superficial skin breakdown and ulceration dorsal aspect left foot. She is currently taking fluconazole and doxycycline.  Assessment: Pain in limb secondary to onychomycosis and edema with ulceration left foot.  Plan: We discussed the etiology pathology conservative versus surgical therapies. Debrided nails 1 through 5 bilateral. Place her in a Unna boot left foot. Made an appointment for dermatology. Follow-up with her in 1 week to remove the [employer].

## 2014-07-05 ENCOUNTER — Encounter (HOSPITAL_BASED_OUTPATIENT_CLINIC_OR_DEPARTMENT_OTHER): Payer: Medicare Other | Attending: Plastic Surgery

## 2014-07-05 DIAGNOSIS — E11621 Type 2 diabetes mellitus with foot ulcer: Secondary | ICD-10-CM | POA: Insufficient documentation

## 2014-07-05 DIAGNOSIS — L97119 Non-pressure chronic ulcer of right thigh with unspecified severity: Secondary | ICD-10-CM | POA: Insufficient documentation

## 2014-07-05 DIAGNOSIS — L97129 Non-pressure chronic ulcer of left thigh with unspecified severity: Secondary | ICD-10-CM | POA: Insufficient documentation

## 2014-07-05 DIAGNOSIS — I872 Venous insufficiency (chronic) (peripheral): Secondary | ICD-10-CM | POA: Insufficient documentation

## 2014-07-05 DIAGNOSIS — L97529 Non-pressure chronic ulcer of other part of left foot with unspecified severity: Secondary | ICD-10-CM | POA: Insufficient documentation

## 2014-07-05 NOTE — Progress Notes (Signed)
[facility]  NAME:  Kelly [name], Kelly [name]               ACCOUNT NO.:  [id number]  MEDICAL RECORD NO.:  [id number]      DATE OF BIRTH:  08/31/1945  PHYSICIAN:  Theodoro Kos, DO       VISIT DATE:  07/05/2014                                  OFFICE VISIT   HISTORY OF PRESENT ILLNESS:  The patient is a 68 year old female, who is here for followup on her left lower extremity and left leg inner thigh ulcer.  She underwent panniculectomy at [facility] and unfortunately had pulmonary embolism, was put on anticoagulation, had bleeding hematoma and the wound opened.  She underwent multiple debridements with VAC. She is now at home.  She is doing much better.  She is using wet-to-dry dressings on the thigh area.  It is pretty well flushed with the surrounding skin and granulating in.  There is no sign of infection. The foot is at least a Wagner 1.  It is swollen, a little bit red.  It is being treated by a podiatrist and she has had an Haematologist on this. There is no change in her medications.  SOCIAL HISTORY:  She is at home now.  Her granddaughter is helping with her care.  PHYSICAL EXAMINATION:  She is alert and oriented, cooperative, not in any acute distress.  She is very pleasant, as usual very appreciative. Her breathing is unlabored and her heart rate is regular.  The wound is clean as described above on the thigh area.  The foot is a little bit swollen at the dorsal aspect at the transmetatarsal area.  RECOMMENDATION:  Protein __________, multivitamin, vitamin C, and zinc. We will check a prealbumin.  Elevation on Unna boot for the leg. Continue wet-to-dry __________, and we will see her back in 1-2 weeks.     Theodoro Kos, DO     CS/MEDQ  D:  07/05/2014  T:  07/05/2014  Job:  [id number]

## 2014-07-07 ENCOUNTER — Telehealth: Payer: Self-pay | Admitting: *Deleted

## 2014-07-07 NOTE — Telephone Encounter (Signed)
"  Dr. Milinda Pointer had made a referral to a Dermatologist and they haven't called me.  Are they still going to call me?  I'm wondering if they are still going to call me because he wanted them to see me about my foot.  Please call me.  I called and apologized to the patient for not being called about the dermatology appointment.  The order was put in the computer but it was not sent to the dermatology office.  I will take care of that today.  "Okay thank you."  I called [facility] to schedule her an appointment.  I was told the patient will have to get her primary care to call and make the appointment because of her Medicaid.  I called and informed the patient that she will have to get her primary care physician to call and make the appointment due to Medicaid.  She stated, "I only use the Medicaid if I am admitted to a facility."  I told her she would still have to get the primary care doctor to make the referral.  I asked if she would like the number to [facility].  "I am still in bed I don't have access to anything to write it down.  Can I call you back?"  I told her to just look up [facility].

## 2014-07-08 ENCOUNTER — Ambulatory Visit: Payer: Medicare Other | Admitting: Podiatry

## 2014-07-11 ENCOUNTER — Encounter (HOSPITAL_COMMUNITY): Payer: Self-pay | Admitting: Emergency Medicine

## 2014-07-11 ENCOUNTER — Emergency Department (HOSPITAL_COMMUNITY): Payer: Medicare Other

## 2014-07-11 ENCOUNTER — Inpatient Hospital Stay (HOSPITAL_COMMUNITY)
Admission: AD | Admit: 2014-07-11 | Discharge: 2014-07-19 | DRG: 872 | Disposition: A | Discharge status: Home Health | Payer: Medicare Other | Attending: Internal Medicine | Admitting: Internal Medicine

## 2014-07-11 DIAGNOSIS — I129 Hypertensive chronic kidney disease with stage 1 through stage 4 chronic kidney disease, or unspecified chronic kidney disease: Secondary | ICD-10-CM | POA: Diagnosis present

## 2014-07-11 DIAGNOSIS — N183 Chronic kidney disease, stage 3 unspecified: Secondary | ICD-10-CM | POA: Diagnosis present

## 2014-07-11 DIAGNOSIS — L0390 Cellulitis, unspecified: Secondary | ICD-10-CM

## 2014-07-11 DIAGNOSIS — Z86718 Personal history of other venous thrombosis and embolism: Secondary | ICD-10-CM | POA: Diagnosis not present

## 2014-07-11 DIAGNOSIS — Z6841 Body Mass Index (BMI) 40.0 and over, adult: Secondary | ICD-10-CM | POA: Diagnosis not present

## 2014-07-11 DIAGNOSIS — E872 Acidosis: Secondary | ICD-10-CM | POA: Diagnosis present

## 2014-07-11 DIAGNOSIS — Z79899 Other long term (current) drug therapy: Secondary | ICD-10-CM | POA: Diagnosis not present

## 2014-07-11 DIAGNOSIS — Z86711 Personal history of pulmonary embolism: Secondary | ICD-10-CM

## 2014-07-11 DIAGNOSIS — I10 Essential (primary) hypertension: Secondary | ICD-10-CM | POA: Diagnosis present

## 2014-07-11 DIAGNOSIS — Z87891 Personal history of nicotine dependence: Secondary | ICD-10-CM

## 2014-07-11 DIAGNOSIS — E875 Hyperkalemia: Secondary | ICD-10-CM | POA: Diagnosis present

## 2014-07-11 DIAGNOSIS — R6520 Severe sepsis without septic shock: Secondary | ICD-10-CM | POA: Diagnosis present

## 2014-07-11 DIAGNOSIS — K219 Gastro-esophageal reflux disease without esophagitis: Secondary | ICD-10-CM | POA: Diagnosis present

## 2014-07-11 DIAGNOSIS — I2699 Other pulmonary embolism without acute cor pulmonale: Secondary | ICD-10-CM | POA: Diagnosis present

## 2014-07-11 DIAGNOSIS — Z794 Long term (current) use of insulin: Secondary | ICD-10-CM

## 2014-07-11 DIAGNOSIS — E1122 Type 2 diabetes mellitus with diabetic chronic kidney disease: Secondary | ICD-10-CM | POA: Diagnosis present

## 2014-07-11 DIAGNOSIS — D631 Anemia in chronic kidney disease: Secondary | ICD-10-CM | POA: Diagnosis present

## 2014-07-11 DIAGNOSIS — L03119 Cellulitis of unspecified part of limb: Secondary | ICD-10-CM | POA: Diagnosis present

## 2014-07-11 DIAGNOSIS — A408 Other streptococcal sepsis: Principal | ICD-10-CM | POA: Diagnosis present

## 2014-07-11 DIAGNOSIS — A419 Sepsis, unspecified organism: Secondary | ICD-10-CM | POA: Diagnosis present

## 2014-07-11 DIAGNOSIS — L03116 Cellulitis of left lower limb: Secondary | ICD-10-CM | POA: Diagnosis present

## 2014-07-11 DIAGNOSIS — R509 Fever, unspecified: Secondary | ICD-10-CM | POA: Diagnosis present

## 2014-07-11 DIAGNOSIS — Z7901 Long term (current) use of anticoagulants: Secondary | ICD-10-CM | POA: Diagnosis not present

## 2014-07-11 DIAGNOSIS — I5032 Chronic diastolic (congestive) heart failure: Secondary | ICD-10-CM | POA: Diagnosis present

## 2014-07-11 DIAGNOSIS — E785 Hyperlipidemia, unspecified: Secondary | ICD-10-CM | POA: Diagnosis present

## 2014-07-11 DIAGNOSIS — I2510 Atherosclerotic heart disease of native coronary artery without angina pectoris: Secondary | ICD-10-CM | POA: Diagnosis present

## 2014-07-11 LAB — CBC WITH DIFFERENTIAL/PLATELET
BASOS PCT: 0 % (ref 0–1)
Basophils Absolute: 0 10*3/uL (ref 0.0–0.1)
EOS PCT: 0 % (ref 0–5)
Eosinophils Absolute: 0 10*3/uL (ref 0.0–0.7)
HEMATOCRIT: 35.9 % — AB (ref 36.0–46.0)
Hemoglobin: 11.4 g/dL — ABNORMAL LOW (ref 12.0–15.0)
Lymphocytes Relative: 4 % — ABNORMAL LOW (ref 12–46)
Lymphs Abs: 0.5 10*3/uL — ABNORMAL LOW (ref 0.7–4.0)
MCH: 27.9 pg (ref 26.0–34.0)
MCHC: 31.8 g/dL (ref 30.0–36.0)
MCV: 88 fL (ref 78.0–100.0)
Monocytes Absolute: 0.4 10*3/uL (ref 0.1–1.0)
Monocytes Relative: 3 % (ref 3–12)
Neutro Abs: 12 10*3/uL — ABNORMAL HIGH (ref 1.7–7.7)
Neutrophils Relative %: 93 % — ABNORMAL HIGH (ref 43–77)
Platelets: 187 10*3/uL (ref 150–400)
RBC: 4.08 MIL/uL (ref 3.87–5.11)
RDW: 15.9 % — AB (ref 11.5–15.5)
WBC: 12.9 10*3/uL — ABNORMAL HIGH (ref 4.0–10.5)

## 2014-07-11 LAB — URINALYSIS, ROUTINE W REFLEX MICROSCOPIC
Bilirubin Urine: NEGATIVE
GLUCOSE, UA: NEGATIVE mg/dL
Ketones, ur: NEGATIVE mg/dL
LEUKOCYTES UA: NEGATIVE
Nitrite: NEGATIVE
SPECIFIC GRAVITY, URINE: 1.016 (ref 1.005–1.030)
Urobilinogen, UA: 0.2 mg/dL (ref 0.0–1.0)
pH: 5 (ref 5.0–8.0)

## 2014-07-11 LAB — COMPREHENSIVE METABOLIC PANEL
ALBUMIN: 2.9 g/dL — AB (ref 3.5–5.2)
ALT: 16 U/L (ref 0–35)
AST: 24 U/L (ref 0–37)
Alkaline Phosphatase: 135 U/L — ABNORMAL HIGH (ref 39–117)
Anion gap: 15 (ref 5–15)
BUN: 40 mg/dL — ABNORMAL HIGH (ref 6–23)
CALCIUM: 9.5 mg/dL (ref 8.4–10.5)
CO2: 19 mEq/L (ref 19–32)
CREATININE: 1.42 mg/dL — AB (ref 0.50–1.10)
Chloride: 102 mEq/L (ref 96–112)
GFR calc Af Amer: 43 mL/min — ABNORMAL LOW (ref >90)
GFR calc non Af Amer: 37 mL/min — ABNORMAL LOW (ref >90)
Glucose, Bld: 155 mg/dL — ABNORMAL HIGH (ref 70–99)
Potassium: 6.1 mEq/L — ABNORMAL HIGH (ref 3.7–5.3)
Sodium: 136 mEq/L — ABNORMAL LOW (ref 137–147)
Total Bilirubin: 0.4 mg/dL (ref 0.3–1.2)
Total Protein: 9.3 g/dL — ABNORMAL HIGH (ref 6.0–8.3)

## 2014-07-11 LAB — URINE MICROSCOPIC-ADD ON

## 2014-07-11 LAB — I-STAT CG4 LACTIC ACID, ED
LACTIC ACID, VENOUS: 2.49 mmol/L — AB (ref 0.5–2.2)
LACTIC ACID, VENOUS: 1.75 mmol/L (ref 0.5–2.2)

## 2014-07-11 LAB — PROTIME-INR
INR: 1.15 (ref 0.00–1.49)
Prothrombin Time: 14.8 seconds (ref 11.6–15.2)

## 2014-07-11 LAB — TROPONIN I

## 2014-07-11 LAB — POTASSIUM: POTASSIUM: 5.5 meq/L — AB (ref 3.7–5.3)

## 2014-07-11 LAB — LACTIC ACID, PLASMA: Lactic Acid, Venous: 2.5 mmol/L — ABNORMAL HIGH (ref 0.5–2.2)

## 2014-07-11 LAB — PRO B NATRIURETIC PEPTIDE: Pro B Natriuretic peptide (BNP): 2482 pg/mL — ABNORMAL HIGH (ref 0–125)

## 2014-07-11 MED ORDER — SODIUM CHLORIDE 0.9 % IV SOLN
1.0000 g | Freq: Once | INTRAVENOUS | Status: AC
  Administered 2014-07-12: 1 g via INTRAVENOUS
  Filled 2014-07-11: qty 10

## 2014-07-11 MED ORDER — SODIUM CHLORIDE 0.9 % IV BOLUS (SEPSIS)
1000.0000 mL | Freq: Once | INTRAVENOUS | Status: AC
  Administered 2014-07-11: 1000 mL via INTRAVENOUS

## 2014-07-11 MED ORDER — OSELTAMIVIR PHOSPHATE 75 MG PO CAPS
75.0000 mg | ORAL_CAPSULE | Freq: Two times a day (BID) | ORAL | Status: DC
  Administered 2014-07-12 – 2014-07-13 (×4): 75 mg via ORAL
  Filled 2014-07-11 (×6): qty 1

## 2014-07-11 MED ORDER — DEXTROSE 5 % IV SOLN
2.0000 g | Freq: Once | INTRAVENOUS | Status: AC
  Administered 2014-07-11: 2 g via INTRAVENOUS
  Filled 2014-07-11: qty 2

## 2014-07-11 MED ORDER — AZTREONAM 2 G IJ SOLR
2.0000 g | Freq: Three times a day (TID) | INTRAMUSCULAR | Status: DC
  Administered 2014-07-12 – 2014-07-17 (×16): 2 g via INTRAVENOUS
  Filled 2014-07-11 (×18): qty 2

## 2014-07-11 MED ORDER — SODIUM CHLORIDE 0.9 % IV SOLN
2000.0000 mg | INTRAVENOUS | Status: DC
  Administered 2014-07-12 – 2014-07-13 (×2): 2000 mg via INTRAVENOUS
  Filled 2014-07-11 (×3): qty 2000

## 2014-07-11 MED ORDER — LEVOFLOXACIN IN D5W 750 MG/150ML IV SOLN
750.0000 mg | INTRAVENOUS | Status: DC
  Administered 2014-07-12 – 2014-07-14 (×3): 750 mg via INTRAVENOUS
  Filled 2014-07-11 (×4): qty 150

## 2014-07-11 MED ORDER — VANCOMYCIN HCL IN DEXTROSE 1-5 GM/200ML-% IV SOLN
1000.0000 mg | Freq: Once | INTRAVENOUS | Status: DC
  Administered 2014-07-11: 1000 mg via INTRAVENOUS
  Filled 2014-07-11: qty 200

## 2014-07-11 MED ORDER — VANCOMYCIN HCL 10 G IV SOLR
2500.0000 mg | INTRAVENOUS | Status: AC
  Administered 2014-07-11: 2500 mg via INTRAVENOUS
  Filled 2014-07-11: qty 2500

## 2014-07-11 MED ORDER — LEVOFLOXACIN IN D5W 750 MG/150ML IV SOLN
750.0000 mg | Freq: Once | INTRAVENOUS | Status: AC
  Administered 2014-07-11: 750 mg via INTRAVENOUS
  Filled 2014-07-11: qty 150

## 2014-07-11 MED ORDER — ACETAMINOPHEN 325 MG PO TABS: 650.0000 mg | ORAL_TABLET | Freq: Four times a day (QID) | ORAL | Status: DC | PRN

## 2014-07-11 MED ORDER — ACETAMINOPHEN 500 MG PO TABS
1000.0000 mg | ORAL_TABLET | Freq: Once | ORAL | Status: AC
  Administered 2014-07-11: 1000 mg via ORAL
  Filled 2014-07-11: qty 2

## 2014-07-11 NOTE — ED Provider Notes (Signed)
CSN: [id number]     Arrival date & time 07/11/14  1904 History   First MD Initiated Contact with Patient 07/11/14 1918     Chief Complaint  Patient presents with  . Weakness  . Fever     (Consider location/radiation/quality/duration/timing/severity/associated sxs/prior Treatment) Patient is a 68 y.o. female presenting with fever.  Fever Temp source:  Subjective Severity:  Severe Onset quality:  Gradual Duration:  1 day Timing:  Constant Progression:  Worsening Chronicity:  New Relieved by:  Nothing Worsened by:  Nothing tried Associated symptoms: chills, headaches and nausea   Associated symptoms: no chest pain, no diarrhea and no vomiting     Past Medical History  Diagnosis Date  . Hypertension   . Hyperlipidemia   . CHF (congestive heart failure)   . GERD (gastroesophageal reflux disease)   . Depression   . Lymphedema of leg     left leg  . Pulmonary embolism 02/18/14    diagnosed at [facility] with CT angio chest  . Chronic indwelling Foley catheter   . Heart murmur     "just found out I have one" (04/07/2014)  . DVT (deep venous thrombosis) 01/2014    LLE; "went from my leg to my lungs"  . Pneumonia 1990's X 2  . Sleep apnea     doesn't use cpap machine or 02 at night (04/07/2014)  . Anemia   . History of blood transfusion 1990's X 2    "when they did my surgeries"  . Migraines     "none for years now" (04/07/2014)  . Arthritis     "arms, legs" (04/07/2014)  . Gout   . Urinary incontinence   . Chronic kidney disease (CKD), stage III (moderate)     [name] 04/07/2014  . Neuropathy   . Diabetes mellitus     "at one time" (04/07/2014)   Past Surgical History  Procedure Laterality Date  . Shoulder open rotator cuff repair Right ~ 2000  . Knee arthroscopy Right ?1980's  . Temporal artery biopsy / ligation Right ?1990's  . Colonoscopy with propofol  09/23/2012    Procedure: COLONOSCOPY WITH PROPOFOL;  Surgeon: Jerene Bears, MD;  Location: [facility];  Service:  Gastroenterology;  Laterality: N/A;  . Panniculectomy Left 02/08/2014    w/wound debridement @ medial thigh  . Irrigation and debridement abscess Left 02/24/2014    Procedure: MINOR INCISION AND DRAINAGE OF ABSCESS;  Surgeon: Theodoro Kos, DO;  Location: [facility];  Service: Plastics;  Laterality: Left;  . I&d extremity Left 03/03/2014    Procedure: IRRIGATION AND DEBRIDEMENT LEFT LEG WOUND AND PLACEMENT OF A CELL AND VAC;  Surgeon: Theodoro Kos, DO;  Location: [place];  Service: Plastics;  Laterality: Left;  . Incision and drainage of wound Left 03/16/2014    Procedure: IRRIGATION AND DEBRIDEMENT OF LEFT THIGH WOUND, APPLICATION ACELL;  Surgeon: Irene Limbo, MD;  Location: [place];  Service: Plastics;  Laterality: Left;  . Incision and drainage of wound Left 03/25/2014    Procedure: IRRIGATION AND DEBRIDEMENT OF LEFT LEG WOUND WITH PLACEMENT OF ACELL/VAC;  Surgeon: Theodoro Kos, DO;  Location: [place];  Service: Plastics;  Laterality: Left;  . Incision and drainage of wound Left 03/31/2014    Procedure: IRRIGATION AND DEBRIDEMENT LEFT LEG WOUND WITH PLACEMENT OF A-CELL/VAC;  Surgeon: Theodoro Kos, DO;  Location: [place];  Service: Plastics;  Laterality: Left;  . Cataract extraction w/ intraocular lens implant Right 2014  . Vena cava filter placement  02/2014  .  Incision and drainage of wound Left 04/07/2014    Procedure: IRRIGATION AND DEBRIDEMENT LEFT LEG WOUND WITH PLACEMENT OF A CELL AND VAC;  Surgeon: Theodoro Kos, DO;  Location: [place];  Service: Plastics;  Laterality: Left;  . Incision and drainage of wound Left 04/14/2014    Procedure: IRRIGATION AND DEBRIDEMENT WOUND;  Surgeon: Theodoro Kos, DO;  Location: [place];  Service: Plastics;  Laterality: Left;  . Application of a-cell of extremity Left 04/14/2014    Procedure: APPLICATION OF A-CELL OF EXTREMITY;  Surgeon: Theodoro Kos, DO;  Location: [place];  Service: Plastics;  Laterality: Left;  Marland Kitchen Minor application of wound vac Left 04/14/2014    Procedure:  MINOR APPLICATION OF WOUND VAC;  Surgeon: Theodoro Kos, DO;  Location: [place];  Service: Plastics;  Laterality: Left;  . Incision and drainage of wound Left 04/21/2014    Procedure: IRRIGATION AND DEBRIDEMENT OF LEFT UPPER LEG WOUND WITH PLACEMENT OF ACELL/VAC;  Surgeon: Theodoro Kos, DO;  Location: [place];  Service: Plastics;  Laterality: Left;   Family History  Problem Relation Age of Onset  . Emphysema Father   . Asthma Brother   . Heart disease Mother   . Stomach cancer Brother   . Heart disease Maternal Grandmother   . Diabetes Mother   . Diabetes Sister    History  Substance Use Topics  . Smoking status: Former Smoker -- 1.00 packs/day for 33 years    Types: Cigarettes    Quit date: 08/27/1981  . Smokeless tobacco: Never Used  . Alcohol Use: Yes     Comment: 04/07/2014 "used to drink; stopped in ~ 1983"   OB History    No data available     Review of Systems  Constitutional: Positive for fever and chills.  Cardiovascular: Negative for chest pain.  Gastrointestinal: Positive for nausea. Negative for vomiting and diarrhea.  Neurological: Positive for headaches.  All other systems reviewed and are negative.     Allergies  Bactrim and Penicillins  Home Medications   Prior to Admission medications   Medication Sig Start Date End Date Taking? Authorizing Provider  acetaminophen (TYLENOL) 325 MG tablet Take 650 mg by mouth every 6 (six) hours as needed for fever.    Historical Provider, MD  allopurinol (ZYLOPRIM) 100 MG tablet Take 100 mg by mouth daily.     Historical Provider, MD  apixaban (ELIQUIS) 5 MG TABS tablet Take 1 tablet (5 mg total) by mouth 2 (two) times daily. 05/14/14   Orson Eva, MD  atorvastatin (LIPITOR) 40 MG tablet Take 40 mg by mouth daily.     Historical Provider, MD  calcium citrate (CALCITRATE - DOSED IN MG ELEMENTAL CALCIUM) 950 MG tablet Take 2.5 tablets (500 mg of elemental calcium total) by mouth 2 (two) times daily. 05/14/14   Orson Eva, MD   cholecalciferol (VITAMIN D) 1000 UNITS tablet Take 1 tablet (1,000 Units total) by mouth daily. 05/14/14   Orson Eva, MD  docusate sodium (COLACE) 100 MG capsule Take 100 mg by mouth 2 (two) times daily.    Historical Provider, MD  esomeprazole (NEXIUM) 40 MG capsule Take 40 mg by mouth daily before breakfast. Take on an empty stomach    Historical Provider, MD  furosemide (LASIX) 20 MG tablet Take 20 mg by mouth daily.    Historical Provider, MD  insulin aspart (NOVOLOG) 100 UNIT/ML injection Inject 0-9 Units into the skin 3 (three) times daily before meals. 70-120=0 121-150=1 151-200=2 201-250=3 251-300=5 301-350=7 351-400=9  Historical Provider, MD  Iron Polysacch Cmplx-B12-FA 150-0.025-1 MG CAPS Take 1 tablet by mouth every morning. 04/09/14   Bobby Rumpf [name], PA-C  isosorbide mononitrate (ISMO,MONOKET) 10 MG tablet Take 10 mg by mouth 2 (two) times daily.    Historical Provider, MD  levofloxacin (LEVAQUIN) 500 MG tablet Take 1 tablet (500 mg total) by mouth daily. 05/14/14   Orson Eva, MD  magnesium oxide (MAG-OX) 400 MG tablet Take 400 mg by mouth daily.     Historical Provider, MD  metoprolol tartrate (LOPRESSOR) 25 MG tablet Take 3 tablets (75 mg total) by mouth 2 (two) times daily. 05/14/14   Orson Eva, MD  Oxycodone HCl 10 MG TABS Take one tablet by mouth every 4 hours as needed for moderate pain 05/14/14   Wille Celeste, PA-C  polyethylene glycol (MIRALAX / GLYCOLAX) packet Take 17 g by mouth daily. Mix with 4-6 oz liquid    Historical Provider, MD  potassium chloride (K-DUR) 10 MEQ tablet Take 10 mEq by mouth daily.    Historical Provider, MD  senna (SENOKOT) 8.6 MG TABS tablet Take 1 tablet (8.6 mg total) by mouth 2 (two) times daily. 04/09/14   Melton Alar, PA-C  zolpidem (AMBIEN) 5 MG tablet Take one tablet by mouth every night at bedtime for rest 05/17/14   Tiffany L Reed, DO   BP 157/56 mmHg  Pulse 114  Temp(Src) 99.4 F (37.4 C) (Oral)  Resp 23  SpO2 97% Physical Exam   Constitutional: She is oriented to person, place, and time. She appears well-developed and well-nourished. No distress.  obese  HENT:  Head: Normocephalic and atraumatic.  Mouth/Throat: Oropharynx is clear and moist.  Eyes: Conjunctivae are normal. Pupils are equal, round, and reactive to light. No scleral icterus.  Neck: Neck supple.  Cardiovascular: Normal rate, regular rhythm, normal heart sounds and intact distal pulses.   No murmur heard. Pulmonary/Chest: Effort normal and breath sounds normal. No stridor. No respiratory distress. She has no rales.  Abdominal: Soft. Bowel sounds are normal. She exhibits no distension. There is no tenderness. There is no rebound and no guarding.  Musculoskeletal: Normal range of motion.  Neurological: She is alert and oriented to person, place, and time.  Skin: Skin is warm and dry. No rash noted.  Skin exam limited by body habitus, particularly of lower extremities.  However, she has open leg wounds on her left upper leg, but they do not appear grossly infected.  No surrounding erythema, no drainage, no purulence.    Psychiatric: She has a normal mood and affect. Her behavior is normal.  Nursing note and vitals reviewed.   ED Course  Procedures (including critical care time) Labs Review Labs Reviewed  URINALYSIS, ROUTINE W REFLEX MICROSCOPIC - Abnormal; Notable for the following:    APPearance CLOUDY (*)    Hgb urine dipstick TRACE (*)    Protein, ur >300 (*)    All other components within normal limits  LACTIC ACID, PLASMA - Abnormal; Notable for the following:    Lactic Acid, Venous 2.5 (*)    All other components within normal limits  CBC WITH DIFFERENTIAL - Abnormal; Notable for the following:    WBC 12.9 (*)    Hemoglobin 11.4 (*)    HCT 35.9 (*)    RDW 15.9 (*)    Neutrophils Relative % 93 (*)    Neutro Abs 12.0 (*)    Lymphocytes Relative 4 (*)    Lymphs Abs 0.5 (*)  All other components within normal limits  COMPREHENSIVE  METABOLIC PANEL - Abnormal; Notable for the following:    Sodium 136 (*)    Potassium 6.1 (*)    Glucose, Bld 155 (*)    BUN 40 (*)    Creatinine, Ser 1.42 (*)    Total Protein 9.3 (*)    Albumin 2.9 (*)    Alkaline Phosphatase 135 (*)    GFR calc non Af Amer 37 (*)    GFR calc Af Amer 43 (*)    All other components within normal limits  PRO B NATRIURETIC PEPTIDE - Abnormal; Notable for the following:    Pro B Natriuretic peptide (BNP) 2482.0 (*)    All other components within normal limits  URINE MICROSCOPIC-ADD ON - Abnormal; Notable for the following:    Squamous Epithelial / LPF FEW (*)    Bacteria, UA FEW (*)    All other components within normal limits  POTASSIUM - Abnormal; Notable for the following:    Potassium 5.5 (*)    All other components within normal limits  I-STAT CG4 LACTIC ACID, ED - Abnormal; Notable for the following:    Lactic Acid, Venous 2.49 (*)    All other components within normal limits  CULTURE, BLOOD (ROUTINE X 2)  CULTURE, BLOOD (ROUTINE X 2)  URINE CULTURE  TROPONIN I  PROTIME-INR  INFLUENZA PANEL BY PCR (TYPE A & B, H1N1)  I-STAT CG4 LACTIC ACID, ED  I-STAT CG4 LACTIC ACID, ED    Imaging Review Dg Chest Port 1 View  (if Code Sepsis Called)  07/11/2014   CLINICAL DATA:  68 year old female with fever, tachycardia and shortness of breath.  EXAM: PORTABLE CHEST - 1 VIEW  COMPARISON:  05/09/2014 and prior chest radiographs  FINDINGS: The cardiomediastinal silhouette is unremarkable.  Slightly increasing peribronchial/interstitial thickening noted.  There is no evidence of focal airspace disease, pulmonary edema, suspicious pulmonary nodule/mass, pleural effusion, or pneumothorax. No acute bony abnormalities are identified.  IMPRESSION: Slightly increasing peribronchial/interstitial thickening -question interstitial infection.   Electronically Signed   By: Hassan Rowan M.D.   On: 07/11/2014 19:58  All radiology studies independently viewed by me.       EKG Interpretation   Date/Time:  Sunday July 11 2014 19:09:04 EST Ventricular Rate:  109 PR Interval:  149 QRS Duration: 138 QT Interval:  359 QTC Calculation: 483 R Axis:   -158 Text Interpretation:  Sinus tachycardia RBBB and LPFB No significant  change was found Confirmed by [facility]  MD, TREY ([id number]) on 07/11/2014  8:44:16 PM      MDM   Final diagnoses:  Sepsis, due to unspecified organism  Fever, unspecified fever cause    68 yo female with recent admission 2 months ago for cellulitis, sepsis who presents now with fever and malaise.  Code Sepsis due to tachycardia and fever.  Unclear source.  After re-eval, she stated sudden onset of symptoms and myalgias as her biggest complaint.  Suspect she might have flu.  Treated empirically.  She also had mild hyperkalemia without EKG changes.  Level improved after fluids.  Discussed case with Dr. Roel Cluck, who will admit.      Artis Delay, MD 07/11/14 [phone]

## 2014-07-11 NOTE — H&P (Signed)
Kelly [name], Kelly Free, NP    Chief Complaint:  Fever and body aches  HPI: Kelly [name] is a 68 y.o. female   has a past medical history of Hypertension; Hyperlipidemia; CHF (congestive heart failure); GERD (gastroesophageal reflux disease); Depression; Lymphedema of leg; Pulmonary embolism (02/18/14); Chronic indwelling Foley catheter; Heart murmur; DVT (deep venous thrombosis) (01/2014); Pneumonia (1990's X 2); Sleep apnea; Anemia; History of blood transfusion (1990's X 2); Migraines; Arthritis; Gout; Urinary incontinence; Chronic kidney disease (CKD), stage III (moderate); Neuropathy; and Diabetes mellitus.   Presented with  patient was admitted in September for left leg cellulitis Into left thigh wound and severe sepsis with group C Streptococcus bacteremia. She has history of morbid obesity, diabetes chronic heart failure and chronic renal failure contributing to overall comorbidity.  Last night she developed sudden onset of fever and chills up to 105. She reports myalgias, denies any cough. Patient actively rigoring. In ER she was noted to be transiently hypotensive down to 98/32. Currently BP up to 176/65. Lactic acid was noted to be 2.5. Now improved to 1.75. Initially patient was thought to be hyperkalemic repeat potassium down to 5.5. Chest x-ray was worrisome for typical infection.  Patient reports persistent soreness of her left thigh but apparently the redness have improved although still persistent.  Patient room more day not taken her flu vaccination Hospitalist was called for admission for Sepsis, febrile illness  Review of Systems:    Pertinent positives include: Fevers, chills, myalgias  Constitutional:  No weight loss, night sweats,  fatigue, weight loss  HEENT:  No headaches, Difficulty swallowing,Tooth/dental problems,Sore throat,  No sneezing, itching, ear ache, nasal congestion, post nasal drip,  Cardio-vascular:  No chest pain, Orthopnea, PND, anasarca, dizziness,  palpitations.no Bilateral lower extremity swelling  GI:  No heartburn, indigestion, abdominal pain, nausea, vomiting, diarrhea, change in bowel habits, loss of appetite, melena, blood in stool, hematemesis Resp:  no shortness of breath at rest. No dyspnea on exertion, No excess mucus, no productive cough, No non-productive cough, No coughing up of blood.No change in color of mucus.No wheezing. Skin:  no rash or lesions. No jaundice GU:  no dysuria, change in color of urine, no urgency or frequency. No straining to urinate.  No flank pain.  Musculoskeletal:  No joint pain or no joint swelling. No decreased range of motion. No back pain.  Psych:  No change in mood or affect. No depression or anxiety. No memory loss.  Neuro: no localizing neurological complaints, no tingling, no weakness, no double vision, no gait abnormality, no slurred speech, no confusion  Otherwise ROS are negative except for above, 10 systems were reviewed  Past Medical History: Past Medical History  Diagnosis Date  . Hypertension   . Hyperlipidemia   . CHF (congestive heart failure)   . GERD (gastroesophageal reflux disease)   . Depression   . Lymphedema of leg     left leg  . Pulmonary embolism 02/18/14    diagnosed at [facility] with CT angio chest  . Chronic indwelling Foley catheter   . Heart murmur     "just found out I have one" (04/07/2014)  . DVT (deep venous thrombosis) 01/2014    LLE; "went from my leg to my lungs"  . Pneumonia 1990's X 2  . Sleep apnea     doesn't use cpap machine or 02 at night (04/07/2014)  . Anemia   . History of blood transfusion 1990's X 2    "when they did my surgeries"  .  Migraines     "none for years now" (04/07/2014)  . Arthritis     "arms, legs" (04/07/2014)  . Gout   . Urinary incontinence   . Chronic kidney disease (CKD), stage III (moderate)     Kelly [name] 04/07/2014  . Neuropathy   . Diabetes mellitus     "at one time" (04/07/2014)   Past Surgical History  Procedure  Laterality Date  . Shoulder open rotator cuff repair Right ~ 2000  . Knee arthroscopy Right ?1980's  . Temporal artery biopsy / ligation Right ?1990's  . Colonoscopy with propofol  09/23/2012    Procedure: COLONOSCOPY WITH PROPOFOL;  Surgeon: Jerene Bears, MD;  Location: [facility];  Service: Gastroenterology;  Laterality: N/A;  . Panniculectomy Left 02/08/2014    w/wound debridement @ medial thigh  . Irrigation and debridement abscess Left 02/24/2014    Procedure: MINOR INCISION AND DRAINAGE OF ABSCESS;  Surgeon: Theodoro Kos, DO;  Location: [facility];  Service: Plastics;  Laterality: Left;  . I&d extremity Left 03/03/2014    Procedure: IRRIGATION AND DEBRIDEMENT LEFT LEG WOUND AND PLACEMENT OF A CELL AND VAC;  Surgeon: Theodoro Kos, DO;  Location: [place];  Service: Plastics;  Laterality: Left;  . Incision and drainage of wound Left 03/16/2014    Procedure: IRRIGATION AND DEBRIDEMENT OF LEFT THIGH WOUND, APPLICATION ACELL;  Surgeon: Irene Limbo, MD;  Location: [place];  Service: Plastics;  Laterality: Left;  . Incision and drainage of wound Left 03/25/2014    Procedure: IRRIGATION AND DEBRIDEMENT OF LEFT LEG WOUND WITH PLACEMENT OF ACELL/VAC;  Surgeon: Theodoro Kos, DO;  Location: [place];  Service: Plastics;  Laterality: Left;  . Incision and drainage of wound Left 03/31/2014    Procedure: IRRIGATION AND DEBRIDEMENT LEFT LEG WOUND WITH PLACEMENT OF A-CELL/VAC;  Surgeon: Theodoro Kos, DO;  Location: [place];  Service: Plastics;  Laterality: Left;  . Cataract extraction w/ intraocular lens implant Right 2014  . Vena cava filter placement  02/2014  . Incision and drainage of wound Left 04/07/2014    Procedure: IRRIGATION AND DEBRIDEMENT LEFT LEG WOUND WITH PLACEMENT OF A CELL AND VAC;  Surgeon: Theodoro Kos, DO;  Location: [place];  Service: Plastics;  Laterality: Left;  . Incision and drainage of wound Left 04/14/2014    Procedure: IRRIGATION AND DEBRIDEMENT WOUND;  Surgeon: Theodoro Kos, DO;  Location:  [place];  Service: Plastics;  Laterality: Left;  . Application of a-cell of extremity Left 04/14/2014    Procedure: APPLICATION OF A-CELL OF EXTREMITY;  Surgeon: Theodoro Kos, DO;  Location: [place];  Service: Plastics;  Laterality: Left;  Marland Kitchen Minor application of wound vac Left 04/14/2014    Procedure: MINOR APPLICATION OF WOUND VAC;  Surgeon: Theodoro Kos, DO;  Location: [place];  Service: Plastics;  Laterality: Left;  . Incision and drainage of wound Left 04/21/2014    Procedure: IRRIGATION AND DEBRIDEMENT OF LEFT UPPER LEG WOUND WITH PLACEMENT OF ACELL/VAC;  Surgeon: Theodoro Kos, DO;  Location: [place];  Service: Plastics;  Laterality: Left;     Medications: Prior to Admission medications   Medication Sig Start Date End Date Taking? Authorizing Provider  acetaminophen (TYLENOL) 325 MG tablet Take 650 mg by mouth every 6 (six) hours as needed for fever.   Yes Historical Provider, MD  allopurinol (ZYLOPRIM) 100 MG tablet Take 100 mg by mouth daily.    Yes Historical Provider, MD  esomeprazole (NEXIUM) 40 MG capsule Take 40 mg by mouth daily before breakfast. Take  on an empty stomach   Yes Historical Provider, MD  furosemide (LASIX) 40 MG tablet Take 40 mg by mouth daily.   Yes Historical Provider, MD  gabapentin (NEURONTIN) 300 MG capsule Take 300 mg by mouth 3 (three) times daily.   Yes Historical Provider, MD  isosorbide mononitrate (ISMO,MONOKET) 10 MG tablet Take 10 mg by mouth 2 (two) times daily.   Yes Historical Provider, MD  metoprolol tartrate (LOPRESSOR) 25 MG tablet Take 3 tablets (75 mg total) by mouth 2 (two) times daily. 05/14/14  Yes Orson Eva, MD  Oxycodone HCl 10 MG TABS Take one tablet by mouth every 4 hours as needed for moderate pain 05/14/14  Yes Arlo C Lassen, PA-C  potassium chloride SA (K-DUR,KLOR-CON) 20 MEQ tablet Take 20 mEq by mouth daily.   Yes Historical Provider, MD  promethazine (PHENERGAN) 25 MG tablet Take 25 mg by mouth every 6 (six) hours as needed for nausea or  vomiting.   Yes Historical Provider, MD  senna (SENOKOT) 8.6 MG TABS tablet Take 1 tablet (8.6 mg total) by mouth 2 (two) times daily. Patient taking differently: Take 1 tablet by mouth daily as needed for mild constipation or moderate constipation.  04/09/14  Yes Bobby Rumpf [name], PA-C  apixaban (ELIQUIS) 5 MG TABS tablet Take 1 tablet (5 mg total) by mouth 2 (two) times daily. 05/14/14   Orson Eva, MD  atorvastatin (LIPITOR) 40 MG tablet Take 40 mg by mouth daily.     Historical Provider, MD  calcium citrate (CALCITRATE - DOSED IN MG ELEMENTAL CALCIUM) 950 MG tablet Take 2.5 tablets (500 mg of elemental calcium total) by mouth 2 (two) times daily. 05/14/14   Orson Eva, MD  cholecalciferol (VITAMIN D) 1000 UNITS tablet Take 1 tablet (1,000 Units total) by mouth daily. 05/14/14   Orson Eva, MD  docusate sodium (COLACE) 100 MG capsule Take 100 mg by mouth 2 (two) times daily.    Historical Provider, MD  furosemide (LASIX) 20 MG tablet Take 20 mg by mouth daily.    Historical Provider, MD  insulin aspart (NOVOLOG) 100 UNIT/ML injection Inject 0-9 Units into the skin 3 (three) times daily before meals. 70-120=0; 121-150=1; 151-200=2; 201-250=3; 251-300=5; 301-350=7; 351-400=9    Historical Provider, MD  Iron Polysacch Cmplx-B12-FA 150-0.025-1 MG CAPS Take 1 tablet by mouth every morning. 04/09/14   Bobby Rumpf [name], PA-C  magnesium oxide (MAG-OX) 400 MG tablet Take 400 mg by mouth daily.     Historical Provider, MD  metolazone (ZAROXOLYN) 5 MG tablet Take 5 mg by mouth every morning. Prior to [place] Provider, MD  polyethylene glycol (MIRALAX / GLYCOLAX) packet Take 17 g by mouth daily. Mix with 4-6 oz liquid    Historical Provider, MD  potassium chloride (K-DUR) 10 MEQ tablet Take 10 mEq by mouth daily.    Historical Provider, MD  zolpidem (AMBIEN) 5 MG tablet Take one tablet by mouth every night at bedtime for rest 05/17/14   Gayland Curry, DO    Allergies:   Allergies  Allergen  Reactions  . Bactrim [Sulfamethoxazole-Trimethoprim] Other (See Comments)    Hyperkalemia (July 2015)  . Penicillins Hives    Social History:  Ambulatory walker cane, Lives at home    reports that she quit smoking about 32 years ago. Her smoking use included Cigarettes. She has a 33 pack-year smoking history. She has never used smokeless tobacco. She reports that she drinks alcohol. She reports that she does not use illicit  drugs.    Family History: family history includes Asthma in her brother; Diabetes in her mother and sister; Emphysema in her father; Heart disease in her maternal grandmother and mother; Stomach cancer in her brother.    Physical Exam: Patient Vitals for the past 24 hrs:  BP Temp Temp src Pulse Resp SpO2 Height Weight  07/11/14 2200 (!) 132/34 mmHg - - 93 16 95 % - -  07/11/14 2130 (!) 98/32 mmHg - - 98 18 97 % - -  07/11/14 2103 - - - - - - 5\' 6"  (1.676 m) (!) 150.141 kg (331 lb)  07/11/14 1903 157/56 mmHg 99.4 F (37.4 C) Oral 114 23 97 % - -    1. General:  in No Acute distress 2. Psychological: Alert and Oriented 3. Head/ENT:   Moist  Mucous Membranes                          Head Non traumatic, neck supple                          Normal   Dentition 4. SKIN:  decreased Skin turgor,  Skin clean Dry , flaking and dry skin present over left foot. Left thigh significant for induration and swelling and redness 5. Heart: Regular rate and rhythm no Murmur, Rub or gallop 6. Lungs:  no wheezes or crackles  Distant lung sounds difficult exam due to morbid obesity 7. Abdomen: Soft, non-tender, Non distended, severely obese 8. Lower extremities: no clubbing, cyanosis, or edema, severely obese 9. Neurologically Grossly intact, moving all 4 extremities equally 10. MSK: Normal range of motion  body mass index is 53.45 kg/(m^2).   Labs on Admission:   Results for orders placed or performed during the hospital encounter of 07/11/14 (from the past 24 hour(s))    Lactic acid, plasma     Status: Abnormal   Collection Time: 07/11/14  7:58 PM  Result Value Ref Range   Lactic Acid, Venous 2.5 (H) 0.5 - 2.2 mmol/L  CBC WITH DIFFERENTIAL     Status: Abnormal   Collection Time: 07/11/14  7:59 PM  Result Value Ref Range   WBC 12.9 (H) 4.0 - 10.5 K/uL   RBC 4.08 3.87 - 5.11 MIL/uL   Hemoglobin 11.4 (L) 12.0 - 15.0 g/dL   HCT 35.9 (L) 36.0 - 46.0 %   MCV 88.0 78.0 - 100.0 fL   MCH 27.9 26.0 - 34.0 pg   MCHC 31.8 30.0 - 36.0 g/dL   RDW 15.9 (H) 11.5 - 15.5 %   Platelets 187 150 - 400 K/uL   Neutrophils Relative % 93 (H) 43 - 77 %   Neutro Abs 12.0 (H) 1.7 - 7.7 K/uL   Lymphocytes Relative 4 (L) 12 - 46 %   Lymphs Abs 0.5 (L) 0.7 - 4.0 K/uL   Monocytes Relative 3 3 - 12 %   Monocytes Absolute 0.4 0.1 - 1.0 K/uL   Eosinophils Relative 0 0 - 5 %   Eosinophils Absolute 0.0 0.0 - 0.7 K/uL   Basophils Relative 0 0 - 1 %   Basophils Absolute 0.0 0.0 - 0.1 K/uL  Comprehensive metabolic panel     Status: Abnormal   Collection Time: 07/11/14  7:59 PM  Result Value Ref Range   Sodium 136 (L) 137 - 147 mEq/L   Potassium 6.1 (H) 3.7 - 5.3 mEq/L   Chloride 102 96 - 112  mEq/L   CO2 19 19 - 32 mEq/L   Glucose, Bld 155 (H) 70 - 99 mg/dL   BUN 40 (H) 6 - 23 mg/dL   Creatinine, Ser 1.42 (H) 0.50 - 1.10 mg/dL   Calcium 9.5 8.4 - 10.5 mg/dL   Total Protein 9.3 (H) 6.0 - 8.3 g/dL   Albumin 2.9 (L) 3.5 - 5.2 g/dL   AST 24 0 - 37 U/L   ALT 16 0 - 35 U/L   Alkaline Phosphatase 135 (H) 39 - 117 U/L   Total Bilirubin 0.4 0.3 - 1.2 mg/dL   GFR calc non Af Amer 37 (L) >90 mL/min   GFR calc Af Amer 43 (L) >90 mL/min   Anion gap 15 5 - 15  Pro b natriuretic peptide (BNP)  - IF patient is dyspneic     Status: Abnormal   Collection Time: 07/11/14  7:59 PM  Result Value Ref Range   Pro B Natriuretic peptide (BNP) 2482.0 (H) 0 - 125 pg/mL  Troponin I     Status: None   Collection Time: 07/11/14  7:59 PM  Result Value Ref Range   Troponin I <0.30 <0.30 ng/mL   Protime-INR     Status: None   Collection Time: 07/11/14  7:59 PM  Result Value Ref Range   Prothrombin Time 14.8 11.6 - 15.2 seconds   INR 1.15 0.00 - 1.49  I-Stat CG4 Lactic Acid, ED     Status: Abnormal   Collection Time: 07/11/14  8:13 PM  Result Value Ref Range   Lactic Acid, Venous 2.49 (H) 0.5 - 2.2 mmol/L  Urinalysis, Routine w reflex microscopic     Status: Abnormal   Collection Time: 07/11/14  8:44 PM  Result Value Ref Range   Color, Urine YELLOW YELLOW   APPearance CLOUDY (A) CLEAR   Specific Gravity, Urine 1.016 1.005 - 1.030   pH 5.0 5.0 - 8.0   Glucose, UA NEGATIVE NEGATIVE mg/dL   Hgb urine dipstick TRACE (A) NEGATIVE   Bilirubin Urine NEGATIVE NEGATIVE   Ketones, ur NEGATIVE NEGATIVE mg/dL   Protein, ur >300 (A) NEGATIVE mg/dL   Urobilinogen, UA 0.2 0.0 - 1.0 mg/dL   Nitrite NEGATIVE NEGATIVE   Leukocytes, UA NEGATIVE NEGATIVE  Urine microscopic-add on     Status: Abnormal   Collection Time: 07/11/14  8:44 PM  Result Value Ref Range   Squamous Epithelial / LPF FEW (A) RARE   WBC, UA 0-2 <3 WBC/hpf   RBC / HPF 0-2 <3 RBC/hpf   Bacteria, UA FEW (A) RARE   Urine-Other AMORPHOUS URATES/PHOSPHATES   I-Stat CG4 Lactic Acid, ED     Status: None   Collection Time: 07/11/14 10:30 PM  Result Value Ref Range   Lactic Acid, Venous 1.75 0.5 - 2.2 mmol/L    UA no evidence of UTI  Lab Results  Component Value Date   HGBA1C 6.5* 05/09/2014    Estimated Creatinine Clearance: 58 mL/min (by C-G formula based on Cr of 1.42).  BNP (last 3 results)  Recent Labs  07/11/14 1959  PROBNP 2482.0*    Other results:  I have pearsonaly reviewed this: ECG REPORT  Rate: 109  Rhythm: ST RBBB ST&T Change: no ischemia   Filed Weights   07/11/14 2103  Weight: 150.141 kg (331 lb)    Cultures:    Component Value Date/Time   SDES BLOOD RIGHT HAND 05/12/2014 1546   SPECREQUEST BOTTLES DRAWN AEROBIC AND ANAEROBIC 5CC 05/12/2014 1546   CULT  05/12/2014 1546    NO  GROWTH 5 DAYS Performed at [facility] 05/18/2014 FINAL 05/12/2014 1546     Radiological Exams on Admission: Dg Chest Port 1 View  (if Code Sepsis Called)  07/11/2014   CLINICAL DATA:  68 year old female with fever, tachycardia and shortness of breath.  EXAM: PORTABLE CHEST - 1 VIEW  COMPARISON:  05/09/2014 and prior chest radiographs  FINDINGS: The cardiomediastinal silhouette is unremarkable.  Slightly increasing peribronchial/interstitial thickening noted.  There is no evidence of focal airspace disease, pulmonary edema, suspicious pulmonary nodule/mass, pleural effusion, or pneumothorax. No acute bony abnormalities are identified.  IMPRESSION: Slightly increasing peribronchial/interstitial thickening -question interstitial infection.   Electronically Signed   By: Hassan Rowan M.D.   On: 07/11/2014 19:58    Chart has been reviewed  Assessment/Plan  68 yo F w hx of DM2, CHF, CKD diastolic, morbid obesity, PE on eliquis here with sepsis likely secondary to a typical pneumonia versus left thigh cellulitis viral illness could not be ruled out.   Present on Admission:  . Sepsis - etiology includes possible atypical pneumonia versus cellulitis. Viral infection also cannot be ruled out. We'll cover broadly with vancomycin, aztreonam, Levaquin, also added Tamiflu. Blood cultures pending urine culture pending. Admit to step down. Discussed with PCCM e-link monitoring . Hyperkalemia - transient currently improving. Hold Potassium. Gentle IV fluids, calcium given in the ER . Coronary atherosclerosis of native coronary artery - currently stable continue statins holding beta blocker given transient hypertension we'll restart her blood pressure is stable . DM type 2 causing CKD stage 3 - continue insulin, sliding scale . Chronic diastolic heart failure - holding Lasix given transient hypotension and sepsis. . Chronic kidney disease, stage 3 - currently stable, continue to monitor .  Essential hypertension - codeine home medications at this point given soft blood pressures in the emergency department. If blood pressure continues to tolerate will need to restart . Morbid obesity - chronic, patient risk of multiple medical complications secondary to morbid obesity . Pulmonary embolism - continue anticoagulation . Cellulitis of left leg: - persistent redness stopped Abilify although  Improved we will cover with IV antibiotics   Prophylaxis:  eliquis, Protonix  CODE STATUS:  FULL CODE  As per patient's wishes  Other plan as per orders.  I have spent a total of  65 min on this admission care was discussed with PCCM  [place] 07/11/2014, 11:29 PM  [facility]  Pager [phone]   after 2 AM please page floor coverage PA If 7AM-7PM, please contact the day team taking care of the patient  [email or web address]  Password [email or web address]

## 2014-07-11 NOTE — ED Notes (Signed)
EMS reports care giver called regarding pt was in bathroom and too weak to get up. Last 3 weeks bil leg edema, with infection. Fever 104, tachycardia, HTN 200/100, DOE and Shortness of breath. Caregiver stated she was treated and released by [facility], pt took all antibiotics and now infection is worse.

## 2014-07-11 NOTE — ED Notes (Signed)
Kelly [name] [phone]

## 2014-07-11 NOTE — Progress Notes (Signed)
ANTIBIOTIC CONSULT NOTE - INITIAL  Pharmacy Consult for Vancomycin, Aztreonam, Levaquin Indication: rule out sepsis  Allergies  Allergen Reactions  . Bactrim [Sulfamethoxazole-Trimethoprim] Other (See Comments)    Hyperkalemia (July 2015)  . Penicillins Hives    Patient Measurements: Height: 5\' 6"  (167.6 cm) Weight: (!) 331 lb (150.141 kg) IBW/kg (Calculated) : 59.3  Vital Signs: Temp: 99.4 F (37.4 C) (11/15 1903) Temp Source: Oral (11/15 1903) BP: 157/56 mmHg (11/15 1903) Pulse Rate: 114 (11/15 1903) Intake/Output from previous day:   Intake/Output from this shift:    Labs:  Recent Labs  07/11/14 1959  WBC 12.9*  HGB 11.4*  PLT 187  CREATININE 1.42*   Estimated Creatinine Clearance: 58 mL/min (by C-G formula based on Cr of 1.42). No results for input(s): VANCOTROUGH, VANCOPEAK, VANCORANDOM, GENTTROUGH, GENTPEAK, GENTRANDOM, TOBRATROUGH, TOBRAPEAK, TOBRARND, AMIKACINPEAK, AMIKACINTROU, AMIKACIN in the last 72 hours.   Microbiology: No results found for this or any previous visit (from the past 720 hour(s)).  Medical History: Past Medical History  Diagnosis Date  . Hypertension   . Hyperlipidemia   . CHF (congestive heart failure)   . GERD (gastroesophageal reflux disease)   . Depression   . Lymphedema of leg     left leg  . Pulmonary embolism 02/18/14    diagnosed at [facility] with [facility] angio chest  . Chronic indwelling Foley catheter   . Heart murmur     "just found out I have one" (04/07/2014)  . DVT (deep venous thrombosis) 01/2014    LLE; "went from my leg to my lungs"  . Pneumonia 1990's X 2  . Sleep apnea     doesn't use cpap machine or 02 at night (04/07/2014)  . Anemia   . History of blood transfusion 1990's X 2    "when they did my surgeries"  . Migraines     "none for years now" (04/07/2014)  . Arthritis     "arms, legs" (04/07/2014)  . Gout   . Urinary incontinence   . Chronic kidney disease (CKD), stage III (moderate)     [name] 04/07/2014  .  Neuropathy   . Diabetes mellitus     "at one time" (04/07/2014)    Medications:  Scheduled:   Infusions:  . levofloxacin (LEVAQUIN) IV    . sodium chloride 1,000 mL (07/11/14 2042)  . vancomycin     PRN: acetaminophen  Assessment: 69 yof brought to ED with fever and weakness. 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 CXR shows questionable interstitial infection. Previous cultures from 9/13 - urine: Proteus miribalis (sensitive zosyn, bactrim only), Group C strep for which she received several days of IV vancomycin then PO levofloxacin.  11/15 >> Vancomycin >> 11/15 >> Aztreonam >> 11/15 >> Levofloxacin >>  Tmax: 99.4 WBC: 12.9k Renal: SCr 1.42 (baseline ~1.0) CrCl 43 ml/min Normalized, 58 ml/min CG Lactate: 2.49  11/15 blood: sent 11/15 urine: sent  Goal of Therapy:  Vancomycin trough level 15-20 mcg/ml  Antibiotic doses appropriate for indication/weight/renal function Eradication of infection  Plan:   Vancomycin 2500mg  IV x 1, then 2000mg  IV q24h Check trough at steady state Aztreonam 2g IV q8h Levaquin 750mg  IV q24h Follow up renal function & cultures, clinical course  Peggyann Juba, PharmD, BCPS Pager: [phone] 07/11/2014,9:18 PM

## 2014-07-12 DIAGNOSIS — L03116 Cellulitis of left lower limb: Secondary | ICD-10-CM | POA: Insufficient documentation

## 2014-07-12 LAB — GLUCOSE, CAPILLARY
GLUCOSE-CAPILLARY: 118 mg/dL — AB (ref 70–99)
GLUCOSE-CAPILLARY: 154 mg/dL — AB (ref 70–99)
Glucose-Capillary: 151 mg/dL — ABNORMAL HIGH (ref 70–99)
Glucose-Capillary: 153 mg/dL — ABNORMAL HIGH (ref 70–99)
Glucose-Capillary: 163 mg/dL — ABNORMAL HIGH (ref 70–99)
Glucose-Capillary: 165 mg/dL — ABNORMAL HIGH (ref 70–99)
Glucose-Capillary: 170 mg/dL — ABNORMAL HIGH (ref 70–99)

## 2014-07-12 LAB — COMPREHENSIVE METABOLIC PANEL
ALBUMIN: 2 g/dL — AB (ref 3.5–5.2)
ALT: 11 U/L (ref 0–35)
ANION GAP: 11 (ref 5–15)
AST: 13 U/L (ref 0–37)
Alkaline Phosphatase: 93 U/L (ref 39–117)
BUN: 37 mg/dL — AB (ref 6–23)
CO2: 20 mEq/L (ref 19–32)
CREATININE: 1.42 mg/dL — AB (ref 0.50–1.10)
Calcium: 8.4 mg/dL (ref 8.4–10.5)
Chloride: 107 mEq/L (ref 96–112)
GFR calc Af Amer: 43 mL/min — ABNORMAL LOW (ref >90)
GFR calc non Af Amer: 37 mL/min — ABNORMAL LOW (ref >90)
Glucose, Bld: 170 mg/dL — ABNORMAL HIGH (ref 70–99)
POTASSIUM: 5 meq/L (ref 3.7–5.3)
Sodium: 138 mEq/L (ref 137–147)
TOTAL PROTEIN: 6.8 g/dL (ref 6.0–8.3)
Total Bilirubin: 0.3 mg/dL (ref 0.3–1.2)

## 2014-07-12 LAB — CBC WITH DIFFERENTIAL/PLATELET
BASOS PCT: 0 % (ref 0–1)
Basophils Absolute: 0 10*3/uL (ref 0.0–0.1)
EOS ABS: 0 10*3/uL (ref 0.0–0.7)
Eosinophils Relative: 0 % (ref 0–5)
HCT: 27.4 % — ABNORMAL LOW (ref 36.0–46.0)
Hemoglobin: 8.7 g/dL — ABNORMAL LOW (ref 12.0–15.0)
Lymphocytes Relative: 4 % — ABNORMAL LOW (ref 12–46)
Lymphs Abs: 0.5 10*3/uL — ABNORMAL LOW (ref 0.7–4.0)
MCH: 28.2 pg (ref 26.0–34.0)
MCHC: 31.8 g/dL (ref 30.0–36.0)
MCV: 88.7 fL (ref 78.0–100.0)
MONO ABS: 0.3 10*3/uL (ref 0.1–1.0)
Monocytes Relative: 2 % — ABNORMAL LOW (ref 3–12)
NEUTROS PCT: 94 % — AB (ref 43–77)
Neutro Abs: 13.5 10*3/uL — ABNORMAL HIGH (ref 1.7–7.7)
Platelets: 159 10*3/uL (ref 150–400)
RBC: 3.09 MIL/uL — ABNORMAL LOW (ref 3.87–5.11)
RDW: 15.8 % — AB (ref 11.5–15.5)
WBC: 14.3 10*3/uL — ABNORMAL HIGH (ref 4.0–10.5)

## 2014-07-12 LAB — LEGIONELLA ANTIGEN, URINE

## 2014-07-12 LAB — MAGNESIUM: Magnesium: 1.3 mg/dL — ABNORMAL LOW (ref 1.5–2.5)

## 2014-07-12 LAB — PROCALCITONIN: Procalcitonin: 11.12 ng/mL

## 2014-07-12 LAB — HEMOGLOBIN A1C
Hgb A1c MFr Bld: 6.7 % — ABNORMAL HIGH (ref <5.7)
MEAN PLASMA GLUCOSE: 146 mg/dL — AB (ref <117)

## 2014-07-12 LAB — INFLUENZA PANEL BY PCR (TYPE A & B)
H1N1FLUPCR: NOT DETECTED
Influenza A By PCR: NEGATIVE
Influenza B By PCR: NEGATIVE

## 2014-07-12 LAB — MRSA PCR SCREENING: MRSA by PCR: POSITIVE — AB

## 2014-07-12 LAB — STREP PNEUMONIAE URINARY ANTIGEN: STREP PNEUMO URINARY ANTIGEN: NEGATIVE

## 2014-07-12 MED ORDER — MAGNESIUM SULFATE 2 GM/50ML IV SOLN
2.0000 g | Freq: Once | INTRAVENOUS | Status: AC
  Administered 2014-07-12: 2 g via INTRAVENOUS
  Filled 2014-07-12: qty 50

## 2014-07-12 MED ORDER — MUPIROCIN 2 % EX OINT
1.0000 "application " | TOPICAL_OINTMENT | Freq: Two times a day (BID) | CUTANEOUS | Status: AC
  Administered 2014-07-12 – 2014-07-16 (×10): 1 via NASAL
  Filled 2014-07-12 (×2): qty 22

## 2014-07-12 MED ORDER — ONDANSETRON HCL 4 MG/2ML IJ SOLN: 4.0000 mg | Freq: Four times a day (QID) | INTRAMUSCULAR | Status: DC | PRN

## 2014-07-12 MED ORDER — FUROSEMIDE 40 MG PO TABS
40.0000 mg | ORAL_TABLET | Freq: Every day | ORAL | Status: DC
  Administered 2014-07-12 – 2014-07-13 (×2): 40 mg via ORAL
  Filled 2014-07-12 (×2): qty 1

## 2014-07-12 MED ORDER — IBUPROFEN 200 MG PO TABS
400.0000 mg | ORAL_TABLET | Freq: Once | ORAL | Status: AC
  Administered 2014-07-12: 400 mg via ORAL
  Filled 2014-07-12: qty 2

## 2014-07-12 MED ORDER — SODIUM CHLORIDE 0.9 % IJ SOLN
3.0000 mL | Freq: Two times a day (BID) | INTRAMUSCULAR | Status: DC
  Administered 2014-07-12 – 2014-07-19 (×6): 3 mL via INTRAVENOUS

## 2014-07-12 MED ORDER — ACETAMINOPHEN 325 MG PO TABS
650.0000 mg | ORAL_TABLET | Freq: Four times a day (QID) | ORAL | Status: DC | PRN
  Administered 2014-07-12 (×2): 650 mg via ORAL
  Filled 2014-07-12 (×2): qty 2

## 2014-07-12 MED ORDER — OXYCODONE HCL 5 MG PO TABS
10.0000 mg | ORAL_TABLET | Freq: Once | ORAL | Status: AC
  Administered 2014-07-12: 10 mg via ORAL
  Filled 2014-07-12: qty 2

## 2014-07-12 MED ORDER — ATORVASTATIN CALCIUM 40 MG PO TABS
40.0000 mg | ORAL_TABLET | Freq: Every day | ORAL | Status: DC
  Administered 2014-07-12 – 2014-07-19 (×8): 40 mg via ORAL
  Filled 2014-07-12 (×8): qty 1

## 2014-07-12 MED ORDER — HYDROMORPHONE HCL 1 MG/ML IJ SOLN
1.0000 mg | INTRAMUSCULAR | Status: DC | PRN
  Administered 2014-07-12 – 2014-07-18 (×17): 1 mg via INTRAVENOUS
  Filled 2014-07-12 (×18): qty 1

## 2014-07-12 MED ORDER — SODIUM CHLORIDE 0.9 % IV SOLN
INTRAVENOUS | Status: DC
  Administered 2014-07-12 (×2): via INTRAVENOUS

## 2014-07-12 MED ORDER — APIXABAN 5 MG PO TABS
5.0000 mg | ORAL_TABLET | Freq: Two times a day (BID) | ORAL | Status: DC
  Administered 2014-07-12 – 2014-07-19 (×15): 5 mg via ORAL
  Filled 2014-07-12 (×17): qty 1

## 2014-07-12 MED ORDER — METOPROLOL TARTRATE 50 MG PO TABS
75.0000 mg | ORAL_TABLET | Freq: Two times a day (BID) | ORAL | Status: DC
  Administered 2014-07-12 – 2014-07-19 (×14): 75 mg via ORAL
  Filled 2014-07-12 (×2): qty 1
  Filled 2014-07-12 (×2): qty 3
  Filled 2014-07-12 (×6): qty 1
  Filled 2014-07-12: qty 3
  Filled 2014-07-12 (×4): qty 1
  Filled 2014-07-12: qty 3

## 2014-07-12 MED ORDER — IPRATROPIUM BROMIDE 0.02 % IN SOLN: 0.5000 mg | Freq: Four times a day (QID) | RESPIRATORY_TRACT | Status: DC

## 2014-07-12 MED ORDER — OXYCODONE HCL 5 MG PO TABS
5.0000 mg | ORAL_TABLET | ORAL | Status: DC | PRN
  Administered 2014-07-12 (×2): 5 mg via ORAL
  Filled 2014-07-12 (×2): qty 1

## 2014-07-12 MED ORDER — ACETAMINOPHEN 650 MG RE SUPP: 650.0000 mg | Freq: Four times a day (QID) | RECTAL | Status: DC | PRN

## 2014-07-12 MED ORDER — ALLOPURINOL 100 MG PO TABS
100.0000 mg | ORAL_TABLET | Freq: Every day | ORAL | Status: DC
  Administered 2014-07-12 – 2014-07-19 (×8): 100 mg via ORAL
  Filled 2014-07-12 (×8): qty 1

## 2014-07-12 MED ORDER — INSULIN ASPART 100 UNIT/ML ~~LOC~~ SOLN
0.0000 [IU] | SUBCUTANEOUS | Status: DC
  Administered 2014-07-12 – 2014-07-13 (×8): 2 [IU] via SUBCUTANEOUS
  Administered 2014-07-13: 1 [IU] via SUBCUTANEOUS
  Administered 2014-07-14: 2 [IU] via SUBCUTANEOUS

## 2014-07-12 MED ORDER — CHLORHEXIDINE GLUCONATE CLOTH 2 % EX PADS
6.0000 | MEDICATED_PAD | Freq: Every day | CUTANEOUS | Status: AC
  Administered 2014-07-12 – 2014-07-16 (×5): 6 via TOPICAL

## 2014-07-12 MED ORDER — ONDANSETRON HCL 4 MG PO TABS
4.0000 mg | ORAL_TABLET | Freq: Four times a day (QID) | ORAL | Status: DC | PRN
  Administered 2014-07-15: 4 mg via ORAL
  Filled 2014-07-12: qty 1

## 2014-07-12 MED ORDER — HYDRALAZINE HCL 20 MG/ML IJ SOLN
5.0000 mg | Freq: Once | INTRAMUSCULAR | Status: DC
Start: 2014-07-12 — End: 2014-07-13

## 2014-07-12 MED ORDER — GABAPENTIN 300 MG PO CAPS
300.0000 mg | ORAL_CAPSULE | Freq: Three times a day (TID) | ORAL | Status: DC
  Administered 2014-07-12 – 2014-07-19 (×22): 300 mg via ORAL
  Filled 2014-07-12 (×26): qty 1

## 2014-07-12 MED ORDER — ZOLPIDEM TARTRATE 5 MG PO TABS
5.0000 mg | ORAL_TABLET | Freq: Every evening | ORAL | Status: DC | PRN
  Administered 2014-07-15 – 2014-07-18 (×3): 5 mg via ORAL
  Filled 2014-07-12 (×3): qty 1

## 2014-07-12 MED ORDER — GUAIFENESIN ER 600 MG PO TB12
600.0000 mg | ORAL_TABLET | Freq: Two times a day (BID) | ORAL | Status: DC
  Administered 2014-07-12 – 2014-07-19 (×14): 600 mg via ORAL
  Filled 2014-07-12 (×17): qty 1

## 2014-07-12 MED ORDER — ALBUTEROL SULFATE (2.5 MG/3ML) 0.083% IN NEBU: 2.5000 mg | INHALATION_SOLUTION | RESPIRATORY_TRACT | Status: DC | PRN

## 2014-07-12 MED ORDER — PANTOPRAZOLE SODIUM 40 MG PO TBEC
40.0000 mg | DELAYED_RELEASE_TABLET | Freq: Every day | ORAL | Status: DC
  Administered 2014-07-12 – 2014-07-19 (×8): 40 mg via ORAL
  Filled 2014-07-12 (×8): qty 1

## 2014-07-12 MED ORDER — CETYLPYRIDINIUM CHLORIDE 0.05 % MT LIQD
7.0000 mL | Freq: Two times a day (BID) | OROMUCOSAL | Status: DC
  Administered 2014-07-12 – 2014-07-19 (×14): 7 mL via OROMUCOSAL

## 2014-07-12 MED ORDER — OXYCODONE HCL 5 MG PO TABS
10.0000 mg | ORAL_TABLET | ORAL | Status: DC | PRN
  Administered 2014-07-12 – 2014-07-18 (×4): 10 mg via ORAL
  Filled 2014-07-12 (×5): qty 2

## 2014-07-12 NOTE — Plan of Care (Signed)
Problem: Phase I Progression Outcomes Goal: Pain controlled with appropriate interventions Outcome: Progressing Goal: Hemodynamically stable Outcome: Progressing     

## 2014-07-12 NOTE — Plan of Care (Signed)
Problem: Phase I Progression Outcomes Goal: Pain controlled with appropriate interventions Outcome: Progressing  Comments:  Pain level 5/10 prior to medicine, 1hr later pain level 2/10.

## 2014-07-12 NOTE — Progress Notes (Signed)
Patient ID: Kelly [name], female   DOB: July 25, 1946, 68 y.o.   MRN: [id number]  [facility] PROGRESS NOTE  Kelly [name] NWG:[id number] DOB: 1946/02/13 DOA: 07-26-14 PCP: Alvester Chou, NP  Brief narrative: 68 y.o. 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 Pt denied chest pain or shortness of breath on admission, no specific abd or urinary concerns. She reports lining at home, ambulates with walker.   In ER she was noted to be transiently hypotensive with SBP 98/32, lactic acid 2.5, T 101.7 F, leukocytosis 12.9, K 6.1. [facility] asked to admit to SDU for sepsis secondary to L thigh cellulitis.   Assessment and Plan:    Active Problems:   Severe sepsis secondary to left thigh cellulitis and ? PNA (HCAP given recent hospitalization)  - sepsis criteria met with T 101.7 F, elevated lactic acid, tachycardia with HR 110 - 120's, WBC 12.9, hypotension - pt clinically stable this AM and reports feeling better, still febrile with T max 101 F since admission - continue broad spectrum ABX Vancomycin, Levaquin, Aztreonam, day #2 - blood culture, urine culture, sputum culture pending - pt is responding well to current therapy, hypotension resolved, HR is WNL, lactic acid stable and WNL   Left thigh cellulitis - if no significant improvement in next 24 hours, will need to ask for MRI of that area for further evaluation    Chronic diastolic CHF - last known EF 60% per 2 D ECHO in 2014 - weight on last  discharge recorded was 318 lbs and now up to 355 lbs  - pt with significant LE edema and says its worse since last discharge 04/2014 - will ask for 2  D ECHO, since BP stable with stop IVF - monitor daily weight, strict I's and O's - avoid use of ACEI due to hyperkalemia and renal failure  - place on home dose Lasix   Pulmonary embolism and DVT - diagnosed on June 2015 - continue Apixaban   Chronic kidney disease, stage 3 - Cr remains stable for now - stop IVF due to concern of volume overload    Hyperkalemia - resolved and WNL this AM, will repeat BMP in AM   Hypomagnesemia - will supplement and repeat Mg in AM   DM type 2 causing CKD stage 3, neuropathies  - A1C 6.7, continue SSI for now until oral intake improves    HTN - restart home dose Lasix 40 mg PO QD and Metoprolol - pt is also on Imdur and will hold off for now until indicated    Morbid obesity, BMI > 40   Anemia of chronic disease, CKD - drop in Hg since admission likely dilutional - no signs of active bleeding, repeat CBC in AM  DVT prophylaxis  Apixaban  Code Status: Full Family Communication: Pt at bedside Disposition Plan: Keep in SDU   IV Access:   Peripheral IV Procedures and diagnostic studies:   Dg Chest Port 1 View  07/26/2014   Slightly increasing peribronchial/interstitial thickening -question interstitial infection.  Medical Consultants:   None Other Consultants:   None  Anti-Infectives:   Levaquin 11/15 --> Aztreonam 11/15 --> Vancomycin 11/15 -->  Faye Ramsay, MD  [facility] Pager [phone]  If 7PM-7AM, please contact night-coverage [email or web address] Password [facility] 07/12/2014, 8:33 AM   LOS: 1 day   HPI/Subjective: No events overnight.   Objective: Filed Vitals:   07/12/14 0256 07/12/14 0300 07/12/14 0400 07/12/14 0600  BP: 138/41 153/48 156/42 145/54  Pulse:  86 78 71  Temp:  101.3 F (38.5 C) 100.8 F (38.2 C) 100 F (37.8 C)  TempSrc:      Resp:  17 15 14   Height:       Weight:      SpO2:  97% 95% 97%    Intake/Output Summary (Last 24 hours) at 07/12/14 0833 Last data filed at 07/12/14 0600  Gross per 24 hour  Intake 259.17 ml  Output    825 ml  Net -565.83 ml    Exam:   General:  Pt is alert, follows commands appropriately, not in acute distress  Cardiovascular: Regular rate and rhythm, S1/S2, no murmurs, no rubs, no gallops  Respiratory: Poor inspiratory effort, diminished air movement at bases   Abdomen: Soft, non tender, non distended, bowel sounds present, no guarding  Extremities: Chronic venous stasis changes, pitting + 1 LE edema, left thigh area redness with warmth to touch  Neuro: Grossly nonfocal  Data Reviewed: Basic Metabolic Panel:  Recent Labs Lab 07/11/14 1959 07/11/14 2258 07/12/14 0410  NA 136*  --  138  K 6.1* 5.5* 5.0  CL 102  --  107  CO2 19  --  20  GLUCOSE 155*  --  170*  BUN 40*  --  37*  CREATININE 1.42*  --  1.42*  CALCIUM 9.5  --  8.4  MG  --   --  1.3*   Liver Function Tests:  Recent Labs Lab 07/11/14 1959 07/12/14 0410  AST 24 13  ALT 16 11  ALKPHOS 135* 93  BILITOT 0.4 0.3  PROT 9.3* 6.8  ALBUMIN 2.9* 2.0*   CBC:  Recent Labs Lab 07/11/14 1959 07/12/14 0410  WBC 12.9* 14.3*  NEUTROABS 12.0* 13.5*  HGB 11.4* 8.7*  HCT 35.9* 27.4*  MCV 88.0 88.7  PLT 187 159   Cardiac Enzymes:  Recent Labs Lab 07/11/14 1959  TROPONINI <0.30   CBG:  Recent Labs Lab 07/12/14 0234 07/12/14 0439  GLUCAP 165* 151*    Recent Results (from the past 240 hour(s))  MRSA PCR Screening     Status: Abnormal   Collection Time: 07/12/14  2:10 AM  Result Value Ref Range Status   MRSA by PCR POSITIVE (A) NEGATIVE Final    Comment:        The GeneXpert MRSA Assay (FDA approved for NASAL specimens only), is one component of a comprehensive MRSA colonization surveillance program. It is not intended to diagnose MRSA infection nor to guide or monitor treatment for MRSA infections. RESULT  CALLED TO, READ BACK BY AND VERIFIED WITH: H.JOHNSON,RN AT 0330 ON 07/12/14 BY [email or web address]      Scheduled Meds: . allopurinol  100 mg Oral Daily  . apixaban  5 mg Oral BID  . atorvastatin  40 mg Oral Daily  . aztreonam  2 g Intravenous 3 times per day  . gabapentin  300 mg Oral TID  . guaiFENesin  600 mg Oral BID  . hydrALAZINE  5 mg Intravenous Once  . insulin aspart  0-9 Units  Subcutaneous 6 times per day  . levofloxacin  IV  750 mg Intravenous Q24H  . oseltamivir  75 mg Oral BID  . pantoprazole  40 mg Oral Daily  . vancomycin  2,000 mg Intravenous Q24H   Continuous Infusions: . sodium chloride 50 mL/hr at 07/12/14 0531

## 2014-07-13 ENCOUNTER — Inpatient Hospital Stay (HOSPITAL_COMMUNITY): Payer: Medicare Other

## 2014-07-13 DIAGNOSIS — I509 Heart failure, unspecified: Secondary | ICD-10-CM

## 2014-07-13 LAB — CBC
HCT: 29 % — ABNORMAL LOW (ref 36.0–46.0)
Hemoglobin: 9.6 g/dL — ABNORMAL LOW (ref 12.0–15.0)
MCH: 28.5 pg (ref 26.0–34.0)
MCHC: 33.1 g/dL (ref 30.0–36.0)
MCV: 86.1 fL (ref 78.0–100.0)
Platelets: 139 10*3/uL — ABNORMAL LOW (ref 150–400)
RBC: 3.37 MIL/uL — AB (ref 3.87–5.11)
RDW: 16.1 % — ABNORMAL HIGH (ref 11.5–15.5)
WBC: 14.6 10*3/uL — ABNORMAL HIGH (ref 4.0–10.5)

## 2014-07-13 LAB — BASIC METABOLIC PANEL
ANION GAP: 15 (ref 5–15)
BUN: 34 mg/dL — ABNORMAL HIGH (ref 6–23)
CALCIUM: 8.5 mg/dL (ref 8.4–10.5)
CHLORIDE: 105 meq/L (ref 96–112)
CO2: 17 meq/L — AB (ref 19–32)
Creatinine, Ser: 1.45 mg/dL — ABNORMAL HIGH (ref 0.50–1.10)
GFR calc Af Amer: 42 mL/min — ABNORMAL LOW (ref >90)
GFR calc non Af Amer: 36 mL/min — ABNORMAL LOW (ref >90)
Glucose, Bld: 99 mg/dL (ref 70–99)
Potassium: 5.2 mEq/L (ref 3.7–5.3)
SODIUM: 137 meq/L (ref 137–147)

## 2014-07-13 LAB — CULTURE, BLOOD (ROUTINE X 2)

## 2014-07-13 LAB — GLUCOSE, CAPILLARY
GLUCOSE-CAPILLARY: 116 mg/dL — AB (ref 70–99)
GLUCOSE-CAPILLARY: 165 mg/dL — AB (ref 70–99)
Glucose-Capillary: 103 mg/dL — ABNORMAL HIGH (ref 70–99)
Glucose-Capillary: 99 mg/dL (ref 70–99)
Glucose-Capillary: 130 mg/dL — ABNORMAL HIGH (ref 70–99)
Glucose-Capillary: 157 mg/dL — ABNORMAL HIGH (ref 70–99)

## 2014-07-13 LAB — URINE CULTURE
CULTURE: NO GROWTH
Colony Count: NO GROWTH

## 2014-07-13 MED ORDER — FUROSEMIDE 10 MG/ML IJ SOLN
40.0000 mg | Freq: Every day | INTRAMUSCULAR | Status: DC
  Administered 2014-07-13: 40 mg via INTRAVENOUS
  Filled 2014-07-13: qty 4

## 2014-07-13 NOTE — Progress Notes (Signed)
Patient ID: Kelly [name], female   DOB: Apr 04, 1946, 68 y.o.   MRN: [id number]  [facility] PROGRESS NOTE  Kelly [name] [id number] DOB: 1945-12-24 DOA: 07/18/14 PCP: Alvester Chou, NP   Brief narrative: 68 y.o. 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 Pt denied chest pain or shortness of breath on admission, no specific abd or urinary concerns. She reports lining at home, ambulates with walker.   In ER she was noted to be transiently hypotensive with SBP 98/32, lactic acid 2.5, T 101.7 F, leukocytosis 12.9, K 6.1. TR[facility] asked to admit to SDU for sepsis secondary to L thigh cellulitis.   Assessment and Plan:    Active Problems:  Severe sepsis secondary to left thigh cellulitis and ? PNA (HCAP given recent hospitalization)  - sepsis criteria met with T 101.7 F, elevated lactic acid, tachycardia with HR 110 - 120's, WBC 12.9, hypotension - pt clinically stable this AM and reports feeling better, still febrile with T max 101.5 F since admission - continue broad spectrum ABX Vancomycin, Levaquin, Aztreonam, day #3 - blood culture, urine culture, sputum culture pending  Left thigh cellulitis - cellulitis is slight better but still worrisome for underlying abscess  - unable to do MRI due to weight limits - will ask for US of the area to see if we can identify pocket of fluid - consulted plastic surgery Dr. Migdalia Dk   Chronic diastolic CHF - last known EF 60% per 2 D ECHO in  2014 - weight on last discharge recorded was 318 lbs and now up to 355 lbs - 356 lbs - pt with significant LE edema and says its worse since last discharge 04/2014 - off IVF since 11/16, 2 D ECHO pending  - monitor daily weight, strict I's and O's - avoid use of ACEI due to hyperkalemia and renal failure  - change home dose Lasix to Lasix 40 mg IV to improve diuresis given stable BP   Pulmonary embolism and DVT - diagnosed on June 2015 - continue Apixaban  Chronic kidney disease, stage 3 - Cr remains stable for now - stopped IVF 11/16 due to concern of volume overload   Hyperkalemia - resolved and WNL this AM, will repeat BMP in AM  Hypomagnesemia - supplemented, will repeat Mg in AM  DM type 2 causing CKD stage 3, neuropathies  - A1C 6.7, continue SSI for now until oral intake improves   HTN - continue Lasix (change to IV), cont Metoprolol  - pt is also on Imdur and will hold off for now until indicated   Morbid obesity, BMI > 40  Anemia of chronic disease, CKD - Hg stable  - no signs of active bleeding, repeat CBC in AM  DVT prophylaxis  Apixaban  Code Status: Full Family Communication: Pt and family at bedside Disposition Plan: Keep in SDU  IV Access:    Peripheral IV Procedures and diagnostic studies:    Dg Chest Port 1 View 07/18/2014 Slightly increasing peribronchial/interstitial thickening -question interstitial infection.  Medical Consultants:  Surgery, plastic - Dr.Sanger Other Consultants:    None  Anti-Infectives:    Levaquin 11/15 -->  Aztreonam 11/15 -->  Vancomycin 11/15 -->   Debbora Presto, MD  [facility] Pager [phone]  If 7PM-7AM, please contact night-coverage [email or web address] Password [email or web address] 07/13/2014, 11:56 AM   LOS: 2 days   HPI/Subjective: No events overnight.   Objective: Filed Vitals:   07/13/14 0400 07/13/14 0500 07/13/14 0600 07/13/14 1006  BP: 134/39  179/57 164/57  Pulse: 75  81 81  Temp: 101.5 F  (38.6 C)  101.5 F (38.6 C)   TempSrc: Core (Comment)     Resp: 14  14   Height:      Weight:  161.481 kg (356 lb)    SpO2: 97%  97%     Intake/Output Summary (Last 24 hours) at 07/13/14 1156 Last data filed at 07/13/14 1011  Gross per 24 hour  Intake    883 ml  Output    875 ml  Net      8 ml    Exam:   General: Pt is alert, follows commands appropriately, not in acute distress  Cardiovascular: Regular rate and rhythm, S1/S2, no murmurs, no rubs, no gallops  Respiratory: Poor inspiratory effort, diminished air movement at bases   Abdomen: Soft, non tender, non distended, bowel sounds present, no guarding  Extremities: Chronic venous stasis changes, pitting + 1 LE edema, left thigh area redness with warmth to touch  Neuro: Grossly nonfocal  Data Reviewed: Basic Metabolic Panel:  Recent Labs Lab 07/11/14 1959 07/11/14 2258 07/12/14 0410 07/13/14 0425  NA 136*  --  138 137  K 6.1* 5.5* 5.0 5.2  CL 102  --  107 105  CO2 19  --  20 17*  GLUCOSE 155*  --  170* 99  BUN 40*  --  37* 34*  CREATININE 1.42*  --  1.42* 1.45*  CALCIUM 9.5  --  8.4 8.5  MG  --   --  1.3*  --    Liver Function Tests:  Recent Labs Lab 07/11/14 1959 07/12/14 0410  AST 24 13  ALT 16 11  ALKPHOS 135* 93  BILITOT 0.4 0.3  PROT 9.3* 6.8  ALBUMIN 2.9* 2.0*   No results for input(s): LIPASE, AMYLASE in the last 168 hours. No results for input(s): AMMONIA in the last 168 hours. CBC:  Recent Labs Lab 07/11/14 1959 07/12/14 0410 07/13/14 0425  WBC 12.9* 14.3* 14.6*  NEUTROABS 12.0* 13.5*  --   HGB 11.4* 8.7* 9.6*  HCT 35.9* 27.4* 29.0*  MCV 88.0 88.7 86.1  PLT 187 159 139*   Cardiac Enzymes:  Recent Labs Lab 07/11/14 1959  TROPONINI <0.30   BNP: Invalid input(s): POCBNP CBG:  Recent Labs Lab 07/12/14 1719 07/12/14 2038 07/12/14 2344 07/13/14 0411 07/13/14 0826  GLUCAP 154* 170* 163* 103* 99    Recent Results (from the past 240 hour(s))  Blood Culture  (routine x 2)     Status: None (Preliminary result)   Collection Time: 07/11/14  7:58 PM  Result Value Ref Range Status   Specimen Description BLOOD R WRIST  Final   Special Requests BOTTLES DRAWN AEROBIC ONLY  Final   Culture  Setup Time   Final    07/12/2014 03:31 Performed at [facility]    Culture   Final           BLOOD CULTURE RECEIVED NO GROWTH TO DATE CULTURE WILL BE HELD FOR 5 DAYS BEFORE ISSUING  A FINAL NEGATIVE REPORT Performed at [facility]    Report Status PENDING  Incomplete  Blood Culture (routine x 2)     Status: None   Collection Time: 07/11/14  7:59 PM  Result Value Ref Range Status   Specimen Description BLOOD R HAND  Final   Special Requests BOTTLES DRAWN AEROBIC ONLY 3 ML  Final   Culture  Setup Time   Final    07/12/2014 03:31 Performed at [facility]    Culture   Final    STREPTOCOCCUS GROUP C Note: Gram Stain Report Called to,Read Back By and Verified With: RENATA TURNAGE ON 07/12/2014 AT 9:45P BY WILEJ Performed at [facility]    Report Status 07/13/2014 FINAL  Final  MRSA PCR Screening     Status: Abnormal   Collection Time: 07/12/14  2:10 AM  Result Value Ref Range Status   MRSA by PCR POSITIVE (A) NEGATIVE Final    Comment:        The GeneXpert MRSA Assay (FDA approved for NASAL specimens only), is one component of a comprehensive MRSA colonization surveillance program. It is not intended to diagnose MRSA infection nor to guide or monitor treatment for MRSA infections. RESULT CALLED TO, READ BACK BY AND VERIFIED WITH: H.JOHNSON,RN AT 0330 ON 07/12/14 BY SHEA.W      Scheduled Meds: . allopurinol  100 mg Oral Daily  . antiseptic oral rinse  7 mL Mouth Rinse BID  . apixaban  5 mg Oral BID  . atorvastatin  40 mg Oral Daily  . aztreonam  2 g Intravenous 3 times per day  . Chlorhexidine Gluconate Cloth  6 each Topical Q0600  . furosemide  40 mg Oral Daily  . gabapentin  300 mg Oral TID  .  guaiFENesin  600 mg Oral BID  . insulin aspart  0-9 Units Subcutaneous 6 times per day  . levofloxacin (LEVAQUIN) IV  750 mg Intravenous Q24H  . metoprolol tartrate  75 mg Oral BID  . mupirocin ointment  1 application Nasal BID  . oseltamivir  75 mg Oral BID  . pantoprazole  40 mg Oral Daily  . sodium chloride  3 mL Intravenous Q12H  . vancomycin  2,000 mg Intravenous Q24H   Continuous Infusions:

## 2014-07-13 NOTE — Plan of Care (Signed)
Problem: Phase I Progression Outcomes Goal: Pain controlled with appropriate interventions Outcome: Progressing     

## 2014-07-13 NOTE — Progress Notes (Signed)
Echocardiogram 2D Echocardiogram has been performed.  Kelly [name] 07/13/2014, 11:35 AM

## 2014-07-13 NOTE — Care Management Note (Signed)
    Page 1 of 2   07/13/2014     2:38:41 PM CARE MANAGEMENT NOTE 07/13/2014  Patient:  Kelly [name], Kelly [name]   Account Number:  [id number]  Date Initiated:  07/13/2014  Documentation initiated by:  Tavionna Grout  Subjective/Objective Assessment:   sepsis recurrent entercoccus     Action/Plan:   home may require snf for sepsis recurrent and to ensure good skin ands wound care/patient was   Anticipated DC Date:  07/16/2014   Anticipated DC Plan:  [place] referral  Clinical Social Worker      DC Planning Services  CM consult      [facility] Choice  NA   Choice offered to / List presented to:  NA   DME arranged  NA      DME agency  NA     [place] arranged  NA      [place] agency  NA   Status of service:  In process, will continue to follow Medicare Important Message given?   (If response is "NO", the following Medicare IM given date fields will be blank) Date Medicare IM given:   Medicare IM given by:   Date Additional Medicare IM given:   Additional Medicare IM given by:    Discharge Disposition:    Per UR Regulation:  Reviewed for med. necessity/level of care/duration of stay  If discussed at [place] of Stay Meetings, dates discussed:    Comments:  [id number]/Kelly Shroff Rosana Hoes, RN, BSN, CCM Chart reviewed. Discharge needs and patient's stay to be reviewed and followed by case manager. Chart note for progression of stay: 68 y.o. 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

## 2014-07-14 ENCOUNTER — Encounter (HOSPITAL_COMMUNITY): Payer: Self-pay | Admitting: Physician Assistant

## 2014-07-14 LAB — BASIC METABOLIC PANEL
Anion gap: 12 (ref 5–15)
BUN: 34 mg/dL — ABNORMAL HIGH (ref 6–23)
CHLORIDE: 106 meq/L (ref 96–112)
CO2: 18 meq/L — AB (ref 19–32)
Calcium: 8.4 mg/dL (ref 8.4–10.5)
Creatinine, Ser: 1.54 mg/dL — ABNORMAL HIGH (ref 0.50–1.10)
GFR calc Af Amer: 39 mL/min — ABNORMAL LOW (ref >90)
GFR, EST NON AFRICAN AMERICAN: 34 mL/min — AB (ref >90)
GLUCOSE: 105 mg/dL — AB (ref 70–99)
POTASSIUM: 4.6 meq/L (ref 3.7–5.3)
SODIUM: 136 meq/L — AB (ref 137–147)

## 2014-07-14 LAB — CBC
HEMATOCRIT: 27.5 % — AB (ref 36.0–46.0)
HEMOGLOBIN: 8.9 g/dL — AB (ref 12.0–15.0)
MCH: 28 pg (ref 26.0–34.0)
MCHC: 32.4 g/dL (ref 30.0–36.0)
MCV: 86.5 fL (ref 78.0–100.0)
Platelets: 155 10*3/uL (ref 150–400)
RBC: 3.18 MIL/uL — AB (ref 3.87–5.11)
RDW: 16.1 % — ABNORMAL HIGH (ref 11.5–15.5)
WBC: 10.7 10*3/uL — ABNORMAL HIGH (ref 4.0–10.5)

## 2014-07-14 LAB — GLUCOSE, CAPILLARY
GLUCOSE-CAPILLARY: 165 mg/dL — AB (ref 70–99)
Glucose-Capillary: 101 mg/dL — ABNORMAL HIGH (ref 70–99)
Glucose-Capillary: 96 mg/dL (ref 70–99)
Glucose-Capillary: 178 mg/dL — ABNORMAL HIGH (ref 70–99)

## 2014-07-14 LAB — MAGNESIUM: Magnesium: 1.6 mg/dL (ref 1.5–2.5)

## 2014-07-14 LAB — VANCOMYCIN, TROUGH: VANCOMYCIN TR: 25.4 ug/mL — AB (ref 10.0–20.0)

## 2014-07-14 MED ORDER — METOLAZONE 5 MG PO TABS
5.0000 mg | ORAL_TABLET | Freq: Every morning | ORAL | Status: DC
  Administered 2014-07-14 – 2014-07-15 (×2): 5 mg via ORAL
  Filled 2014-07-14 (×3): qty 1

## 2014-07-14 MED ORDER — INSULIN ASPART 100 UNIT/ML ~~LOC~~ SOLN
0.0000 [IU] | Freq: Three times a day (TID) | SUBCUTANEOUS | Status: DC
  Administered 2014-07-14: 2 [IU] via SUBCUTANEOUS
  Administered 2014-07-15: 1 [IU] via SUBCUTANEOUS
  Administered 2014-07-15: 2 [IU] via SUBCUTANEOUS
  Administered 2014-07-16 – 2014-07-18 (×6): 1 [IU] via SUBCUTANEOUS
  Administered 2014-07-18: 2 [IU] via SUBCUTANEOUS

## 2014-07-14 MED ORDER — HYDRALAZINE HCL 20 MG/ML IJ SOLN
10.0000 mg | INTRAMUSCULAR | Status: DC | PRN
  Administered 2014-07-14 – 2014-07-15 (×2): 10 mg via INTRAVENOUS
  Filled 2014-07-14 (×2): qty 1

## 2014-07-14 MED ORDER — POLYETHYLENE GLYCOL 3350 17 G PO PACK
17.0000 g | PACK | Freq: Two times a day (BID) | ORAL | Status: DC
  Administered 2014-07-14 – 2014-07-16 (×5): 17 g via ORAL
  Filled 2014-07-14 (×9): qty 1

## 2014-07-14 MED ORDER — ISOSORBIDE MONONITRATE 10 MG PO TABS
10.0000 mg | ORAL_TABLET | Freq: Two times a day (BID) | ORAL | Status: DC
  Administered 2014-07-14 – 2014-07-19 (×11): 10 mg via ORAL
  Filled 2014-07-14 (×13): qty 1

## 2014-07-14 MED ORDER — HYDRALAZINE HCL 20 MG/ML IJ SOLN
10.0000 mg | Freq: Once | INTRAMUSCULAR | Status: AC
  Administered 2014-07-14: 10 mg via INTRAVENOUS
  Filled 2014-07-14: qty 1

## 2014-07-14 MED ORDER — VANCOMYCIN HCL 10 G IV SOLR
1500.0000 mg | INTRAVENOUS | Status: DC
  Administered 2014-07-15 – 2014-07-19 (×5): 1500 mg via INTRAVENOUS
  Filled 2014-07-14 (×6): qty 1500

## 2014-07-14 MED ORDER — FUROSEMIDE 40 MG PO TABS
40.0000 mg | ORAL_TABLET | Freq: Every day | ORAL | Status: DC
Start: 2014-07-14 — End: 2014-07-19
  Administered 2014-07-14 – 2014-07-19 (×6): 40 mg via ORAL
  Filled 2014-07-14 (×6): qty 1

## 2014-07-14 MED ORDER — SENNA 8.6 MG PO TABS
1.0000 | ORAL_TABLET | Freq: Two times a day (BID) | ORAL | Status: DC
  Administered 2014-07-14 – 2014-07-16 (×6): 8.6 mg via ORAL
  Filled 2014-07-14 (×6): qty 1

## 2014-07-14 NOTE — Progress Notes (Signed)
Report given to [employer]. Patient is stable at transfer.

## 2014-07-14 NOTE — Consult Note (Signed)
Reason for Consult:Cellulitis of left lower extremity Referring Physician: Dr. Donnamarie Poag is an 68 y.o. female.  HPI: 68 y.o. 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 Pt denied chest pain or shortness of breath on admission, no specific abd or urinary concerns. She reports lining at home, ambulates with walker.  She was started on broad spectrum antibiotic coverage (Vancomycin, Levaquin, aztreonam) for cellulitis of the left anterior thigh and left hip area. Ultrasound of the areas has not shown any discreet areas of abscess. The left thigh wound medially has continued to granulate in and has only a very small residual open area superficially which is draining some scant serous fluid. There is induration and some erythema of the right anterior upper thigh and left hip area. There is also erythema and induration of the right thigh medially.  Fever curve and leukocytosis slowly improving with antibiotics. No CT or MRI scan due to weight.   Past Medical History  Diagnosis Date  . Hypertension   . Hyperlipidemia   . CHF (congestive heart failure)   . GERD (gastroesophageal reflux disease)   . Depression   . Lymphedema of leg     left leg  . Pulmonary embolism 02/18/14    diagnosed at [facility] with CT angio chest  . Chronic indwelling Foley catheter   . Heart murmur     "just found out I have one" (04/07/2014)  . DVT (deep venous thrombosis) 01/2014    LLE; "went from my leg to my lungs"  .  Pneumonia 1990's X 2  . Sleep apnea     doesn't use cpap machine or 02 at night (04/07/2014)  . Anemia   . History of blood transfusion 1990's X 2    "when they did my surgeries"  . Migraines     "none for years now" (04/07/2014)  . Arthritis     "arms, legs" (04/07/2014)  . Gout   . Urinary incontinence   . Chronic kidney disease (CKD), stage III (moderate)     [name] 04/07/2014  . Neuropathy   . Diabetes mellitus     "at one time" (04/07/2014)    Past Surgical History  Procedure Laterality Date  . Shoulder open rotator cuff repair Right ~ 2000  . Knee arthroscopy Right ?1980's  . Temporal artery biopsy / ligation Right ?1990's  . Colonoscopy with propofol  09/23/2012    Procedure: COLONOSCOPY WITH PROPOFOL;  Surgeon: Jerene Bears, MD;  Location: [facility];  Service: Gastroenterology;  Laterality: N/A;  . Panniculectomy Left 02/08/2014    w/wound debridement @ medial thigh  . Irrigation and debridement abscess Left 02/24/2014    Procedure: MINOR INCISION AND DRAINAGE OF ABSCESS;  Surgeon: Theodoro Kos, DO;  Location: [facility];  Service: Plastics;  Laterality: Left;  . I&d extremity Left 03/03/2014    Procedure: IRRIGATION AND DEBRIDEMENT LEFT LEG WOUND AND PLACEMENT OF A CELL AND VAC;  Surgeon: Theodoro Kos, DO;  Location: [place];  Service: Plastics;  Laterality: Left;  . Incision  and drainage of wound Left 03/16/2014    Procedure: IRRIGATION AND DEBRIDEMENT OF LEFT THIGH WOUND, APPLICATION ACELL;  Surgeon: Irene Limbo, MD;  Location: [place];  Service: Plastics;  Laterality: Left;  . Incision and drainage of wound Left 03/25/2014    Procedure: IRRIGATION AND DEBRIDEMENT OF LEFT LEG WOUND WITH PLACEMENT OF ACELL/VAC;  Surgeon: Theodoro Kos, DO;  Location: [place];  Service: Plastics;  Laterality: Left;  . Incision and drainage of wound Left 03/31/2014    Procedure: IRRIGATION AND DEBRIDEMENT LEFT LEG WOUND WITH PLACEMENT OF A-CELL/VAC;  Surgeon: Theodoro Kos, DO;  Location: [place];   Service: Plastics;  Laterality: Left;  . Cataract extraction w/ intraocular lens implant Right 2014  . Vena cava filter placement  02/2014  . Incision and drainage of wound Left 04/07/2014    Procedure: IRRIGATION AND DEBRIDEMENT LEFT LEG WOUND WITH PLACEMENT OF A CELL AND VAC;  Surgeon: Theodoro Kos, DO;  Location: [place];  Service: Plastics;  Laterality: Left;  . Incision and drainage of wound Left 04/14/2014    Procedure: IRRIGATION AND DEBRIDEMENT WOUND;  Surgeon: Theodoro Kos, DO;  Location: [place];  Service: Plastics;  Laterality: Left;  . Application of a-cell of extremity Left 04/14/2014    Procedure: APPLICATION OF A-CELL OF EXTREMITY;  Surgeon: Theodoro Kos, DO;  Location: [place];  Service: Plastics;  Laterality: Left;  Marland Kitchen Minor application of wound vac Left 04/14/2014    Procedure: MINOR APPLICATION OF WOUND VAC;  Surgeon: Theodoro Kos, DO;  Location: [place];  Service: Plastics;  Laterality: Left;  . Incision and drainage of wound Left 04/21/2014    Procedure: IRRIGATION AND DEBRIDEMENT OF LEFT UPPER LEG WOUND WITH PLACEMENT OF ACELL/VAC;  Surgeon: Theodoro Kos, DO;  Location: [place];  Service: Plastics;  Laterality: Left;    Family History  Problem Relation Age of Onset  . Emphysema Father   . Asthma Brother   . Heart disease Mother   . Stomach cancer Brother   . Heart disease Maternal Grandmother   . Diabetes Mother   . Diabetes Sister     Social History:  reports that she quit smoking about 32 years ago. Her smoking use included Cigarettes. She has a 33 pack-year smoking history. She has never used smokeless tobacco. She reports that she drinks alcohol. She reports that she does not use illicit drugs.  Allergies:  Allergies  Allergen Reactions  . Bactrim [Sulfamethoxazole-Trimethoprim] Other (See Comments)    Hyperkalemia (July 2015)  . Penicillins Hives    Medications: I have reviewed the patient's current medications.  Results for orders placed or performed during the  hospital encounter of 07/11/14 (from the past 48 hour(s))  Glucose, capillary     Status: Abnormal   Collection Time: 07/12/14  8:37 AM  Result Value Ref Range   Glucose-Capillary 118 (H) 70 - 99 mg/dL  Glucose, capillary     Status: Abnormal   Collection Time: 07/12/14 12:51 PM  Result Value Ref Range   Glucose-Capillary 153 (H) 70 - 99 mg/dL   Comment 1 Documented in Chart    Comment 2 Notify RN   Glucose, capillary     Status: Abnormal   Collection Time: 07/12/14  5:19 PM  Result Value Ref Range   Glucose-Capillary 154 (H) 70 - 99 mg/dL   Comment 1 Documented in Chart    Comment 2 Notify RN   Glucose, capillary     Status: Abnormal   Collection Time: 07/12/14  8:38 PM  Result Value Ref Range   Glucose-Capillary 170 (H) 70 - 99 mg/dL   Comment 1 Documented in Chart    Comment 2 Notify RN   Glucose, capillary     Status: Abnormal   Collection Time: 07/12/14 11:44 PM  Result Value Ref Range   Glucose-Capillary 163 (H) 70 - 99 mg/dL  Glucose, capillary     Status: Abnormal   Collection Time: 07/13/14  4:11 AM  Result Value Ref Range   Glucose-Capillary 103 (H) 70 - 99 mg/dL  CBC     Status: Abnormal   Collection Time: 07/13/14  4:25 AM  Result Value Ref Range   WBC 14.6 (H) 4.0 - 10.5 K/uL   RBC 3.37 (L) 3.87 - 5.11 MIL/uL   Hemoglobin 9.6 (L) 12.0 - 15.0 g/dL   HCT 29.0 (L) 36.0 - 46.0 %   MCV 86.1 78.0 - 100.0 fL   MCH 28.5 26.0 - 34.0 pg   MCHC 33.1 30.0 - 36.0 g/dL   RDW 16.1 (H) 11.5 - 15.5 %   Platelets 139 (L) 150 - 400 K/uL  Basic metabolic panel     Status: Abnormal   Collection Time: 07/13/14  4:25 AM  Result Value Ref Range   Sodium 137 137 - 147 mEq/L   Potassium 5.2 3.7 - 5.3 mEq/L   Chloride 105 96 - 112 mEq/L   CO2 17 (L) 19 - 32 mEq/L   Glucose, Bld 99 70 - 99 mg/dL   BUN 34 (H) 6 - 23 mg/dL   Creatinine, Ser 1.45 (H) 0.50 - 1.10 mg/dL   Calcium 8.5 8.4 - 10.5 mg/dL   GFR calc non Af Amer 36 (L) >90 mL/min   GFR calc Af Amer 42 (L) >90 mL/min     Comment: (NOTE) The eGFR has been calculated using the CKD EPI equation. This calculation has not been validated in all clinical situations. eGFR's persistently <90 mL/min signify possible Chronic Kidney Disease.    Anion gap 15 5 - 15  Glucose, capillary     Status: None   Collection Time: 07/13/14  8:26 AM  Result Value Ref Range   Glucose-Capillary 99 70 - 99 mg/dL  Glucose, capillary     Status: Abnormal   Collection Time: 07/13/14 11:57 AM  Result Value Ref Range   Glucose-Capillary 130 (H) 70 - 99 mg/dL  Glucose, capillary     Status: Abnormal   Collection Time: 07/13/14  4:37 PM  Result Value Ref Range   Glucose-Capillary 157 (H) 70 - 99 mg/dL   Comment 1 Documented in Chart    Comment 2 Notify RN   Glucose, capillary     Status: Abnormal   Collection Time: 07/13/14  7:48 PM  Result Value Ref Range   Glucose-Capillary 165 (H) 70 - 99 mg/dL  Glucose, capillary     Status: Abnormal   Collection Time: 07/13/14 11:48 PM  Result Value Ref Range   Glucose-Capillary 116 (H) 70 - 99 mg/dL   Comment 1 Documented in Chart    Comment 2 Notify RN   CBC     Status: Abnormal   Collection Time: 07/14/14  3:39 AM  Result Value Ref Range   WBC 10.7 (H) 4.0 - 10.5 K/uL   RBC 3.18 (L) 3.87 - 5.11 MIL/uL   Hemoglobin 8.9 (L) 12.0 - 15.0 g/dL   HCT 27.5 (L) 36.0 - 46.0 %   MCV 86.5 78.0 - 100.0 fL   MCH 28.0 26.0 - 34.0 pg  MCHC 32.4 30.0 - 36.0 g/dL   RDW 16.1 (H) 11.5 - 15.5 %   Platelets 155 150 - 400 K/uL  Basic metabolic panel     Status: Abnormal   Collection Time: 07/14/14  3:39 AM  Result Value Ref Range   Sodium 136 (L) 137 - 147 mEq/L   Potassium 4.6 3.7 - 5.3 mEq/L   Chloride 106 96 - 112 mEq/L   CO2 18 (L) 19 - 32 mEq/L   Glucose, Bld 105 (H) 70 - 99 mg/dL   BUN 34 (H) 6 - 23 mg/dL   Creatinine, Ser 1.54 (H) 0.50 - 1.10 mg/dL   Calcium 8.4 8.4 - 10.5 mg/dL   GFR calc non Af Amer 34 (L) >90 mL/min   GFR calc Af Amer 39 (L) >90 mL/min    Comment: (NOTE) The eGFR  has been calculated using the CKD EPI equation. This calculation has not been validated in all clinical situations. eGFR's persistently <90 mL/min signify possible Chronic Kidney Disease.    Anion gap 12 5 - 15  Glucose, capillary     Status: Abnormal   Collection Time: 07/14/14  4:28 AM  Result Value Ref Range   Glucose-Capillary 101 (H) 70 - 99 mg/dL    [place] Extrem Low Left Ltd  07/13/2014   CLINICAL DATA:  Evaluate area of cellulitis along the left thigh and hip region. Evaluate for possible abscess.  EXAM: ULTRASOUND LEFT LOWER EXTREMITY LIMITED  TECHNIQUE: Ultrasound examination of the lower extremity soft tissues was performed in the area of clinical concern.  COMPARISON:  CT, 05/09/2014  FINDINGS: Edema is noted throughout soft tissues in the area of interest along the left hip and proximal thigh. There is no fluid collection to suggest an abscess. There is no mass.  IMPRESSION: No evidence of an abscess.   Electronically Signed   By: Lajean Manes M.D.   On: 07/13/2014 13:24    Review of Systems  Constitutional: Positive for fever and chills.  Neurological: Positive for weakness.   Blood pressure 168/51, pulse 71, temperature 99.9 F (37.7 C), temperature source Core (Comment), resp. rate 14, height 5' 6"  (1.676 m), weight 161.934 kg (357 lb), SpO2 98 %. Physical Exam  Constitutional:  Morbidly obese female lying in bed in NAD.   HENT:  Head: Normocephalic and atraumatic.  Cardiovascular: Normal rate and regular rhythm.   Respiratory: Effort normal. No respiratory distress.  Skin:  The left hip and anterior thigh skin is indurated and erythematous. The left thigh wound medially has continued to granulate in and has only a very small residual open area superficially which is draining some scant serous fluidThere is some erythema and induration about the right medial thigh as well.    Psychiatric: She has a normal mood and affect. Her behavior is normal. Judgment and thought  content normal.    Assessment/Plan: Left medial thigh ulcer, superficial with skin involvement only at this point.  Left thigh and hip cellulitis without evidence of abscess pocket on [place]- Discussed with Dr. Romualdo Bolk operative at this point. Wound recommend ID consult for antibiotic regimen.  She was on wet to dry dressings to the very small residual wound over the medial thigh, but there is minimal wound and could just use triple antibiotic ointment to this area at this point.   Kaci Dillie,PA-C Plastic Surgery [phone]

## 2014-07-14 NOTE — Progress Notes (Signed)
Patient ID: Kelly [name], female   DOB: Jul 09, 1946, 68 y.o.   MRN: [id number]   [facility] PROGRESS NOTE  Kelly [name] QIH:[id number] DOB: 06-05-1946 DOA: 2014-07-24 PCP: Alvester Chou, NP  Brief narrative: 68 y.o. 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 Pt denied chest pain or shortness of breath on admission, no specific abd or urinary concerns. She reports lining at home, ambulates with walker.   In ER she was noted to be transiently hypotensive with SBP 98/32, lactic acid 2.5, T 101.7 F, leukocytosis 12.9, K 6.1. [facility] asked to admit to SDU for sepsis secondary to L thigh cellulitis.   Assessment and Plan:    Active Problems:  Severe sepsis secondary to left thigh cellulitis and ? PNA (HCAP given recent hospitalization)  - sepsis criteria met with T 101.7 F, elevated lactic acid, tachycardia with HR 110 - 120's, WBC 12.9, hypotension - pt clinically stable this AM and reports feeling better, still febrile with T max 99.3 F over the past 24 hours  - continue broad spectrum ABX Vancomycin, Levaquin, Aztreonam, day #4 - blood culture, urine culture, sputum culture pending, WBC trending down: 14.3 --> 14.6 --> 10.7  - appreciate Plastic Surgery team assistance   Left thigh cellulitis - cellulitis is slight better but still worrisome for underlying abscess  - unable to do MRI due to weight limits - [place] with no identifiable pocket of fluid  - consulted plastic surgery Dr.  Migdalia Dk   Chronic diastolic CHF - last known EF 60% per 2 D ECHO in 2014 - weight on last discharge recorded was 318 lbs and now up to 355 lbs - 356 lbs - pt with significant LE edema and says its worse since last discharge 04/2014 - off IVF since 11/16, 2 D ECHO with normal systolic function Ef 87%, difficult to evaluate diastolic parameters  - monitor daily weight, strict I's and O's - avoid use of ACEI due to hyperkalemia and renal failure  - continue home dose lasix   Pulmonary embolism and DVT - diagnosed on June 2015 - continue Apixaban  Chronic kidney disease, stage 3 - Cr remains stable for now - stopped IVF 11/16 due to concern of volume overload  - Cr up over the past 24 hours, close monitoring   Hyperkalemia - resolved and WNL this AM, will repeat BMP in AM  Hypomagnesemia - supplemented, will check Mg level this AM  DM type 2 causing CKD stage 3, neuropathies  - A1C 6.7, continue SSI for now until oral intake improves   HTN - continue Lasix, cont Metoprolol  - add Imdur per home medical regimen   Morbid obesity, BMI > 40  Anemia of chronic disease, CKD - no signs of active bleeding, repeat CBC in AM  DVT prophylaxis  Apixaban  Code Status: Full Family Communication: Pt and family at bedside Disposition Plan: transfer to telemetry unit   IV Access:    Peripheral IV Procedures and diagnostic studies:    Dg Chest Port 1 View 2014-07-24  Slightly increasing peribronchial/interstitial thickening -question interstitial infection.  Medical Consultants:    Surgery, plastic - Dr.Sanger Other Consultants:    None  Anti-Infectives:    Levaquin 11/15 -->  Aztreonam 11/15 -->  Vancomycin 11/15 -->  Faye Ramsay, MD  [facility] Pager [phone]  If 7PM-7AM, please contact night-coverage [email or web address] Password [facility] 07/14/2014, 6:52 AM   LOS: 3 days   HPI/Subjective: No events overnight.   Objective: Filed Vitals:    07/14/14 0000 07/14/14 0200 07/14/14 0400 07/14/14 0500  BP: 201/67 151/47 168/51   Pulse: 63 65 71   Temp: 99.9 F (37.7 C) 99.9 F (37.7 C) 99.9 F (37.7 C)   TempSrc: Core (Comment) Core (Comment) Core (Comment)   Resp: 21 17 14    Height:      Weight:   160.573 kg (354 lb) 161.934 kg (357 lb)  SpO2: 97% 98% 98%     Intake/Output Summary (Last 24 hours) at 07/14/14 0652 Last data filed at 07/14/14 0546  Gross per 24 hour  Intake    913 ml  Output   2675 ml  Net  -1762 ml    Exam:   General:  Pt is alert, follows commands appropriately, not in acute distress  Cardiovascular: Regular rate and rhythm, S1/S2, no murmurs, no rubs, no gallops  Respiratory: Clear to auscultation bilaterally, no wheezing, diminished breath sounds at bases   Abdomen: Soft, non tender, non distended, bowel sounds present, no guarding  Extremities: chronic venous stasis changes bilaterally, left thigh cellulitis with erythema and TTP  Data Reviewed: Basic Metabolic Panel:  Recent Labs Lab 07/11/14 1959 07/11/14 2258 07/12/14 0410 07/13/14 0425 07/14/14 0339  NA 136*  --  138 137 136*  K 6.1* 5.5* 5.0 5.2 4.6  CL 102  --  107 105 106  CO2 19  --  20 17* 18*  GLUCOSE 155*  --  170* 99 105*  BUN 40*  --  37* 34* 34*  CREATININE 1.42*  --  1.42* 1.45* 1.54*  CALCIUM 9.5  --  8.4 8.5 8.4  MG  --   --  1.3*  --   --    Liver Function Tests:  Recent Labs Lab 07/11/14 1959 07/12/14 0410  AST 24 13  ALT 16 11  ALKPHOS 135* 93  BILITOT 0.4 0.3  PROT 9.3* 6.8  ALBUMIN 2.9* 2.0*   CBC:  Recent Labs Lab 07/11/14 1959 07/12/14 0410 07/13/14 0425 07/14/14 0339  WBC 12.9* 14.3* 14.6* 10.7*  NEUTROABS 12.0* 13.5*  --   --   HGB 11.4* 8.7* 9.6* 8.9*  HCT 35.9* 27.4* 29.0* 27.5*  MCV 88.0 88.7 86.1 86.5  PLT 187 159 139* 155   Cardiac Enzymes:  Recent Labs Lab 07/11/14 1959  TROPONINI <0.30   CBG:  Recent Labs Lab 07/13/14 1157 07/13/14 1637 07/13/14 1948  07/13/14 2348 07/14/14 0428  GLUCAP 130* 157* 165* 116* 101*    Recent Results (from the past 240 hour(s))  Blood Culture (routine x 2)     Status: None (Preliminary result)   Collection Time: 07/11/14  7:58 PM  Result Value Ref Range Status   Specimen Description BLOOD R WRIST  Final   Special Requests BOTTLES DRAWN AEROBIC ONLY 3ML  Final   Culture  Setup Time   Final    07/12/2014 03:31 Performed at [facility]    Culture   Final           BLOOD CULTURE RECEIVED NO GROWTH TO DATE CULTURE  WILL BE HELD FOR 5 DAYS BEFORE ISSUING A FINAL NEGATIVE REPORT Performed at [facility]    Report Status PENDING  Incomplete  Blood Culture (routine x 2)     Status: None   Collection Time: 07/11/14  7:59 PM  Result Value Ref Range Status   Specimen Description BLOOD R HAND  Final   Special Requests BOTTLES DRAWN AEROBIC ONLY 3 ML  Final   Culture  Setup Time   Final    07/12/2014 03:31 Performed at [facility]    Culture   Final    STREPTOCOCCUS GROUP C Note: Gram Stain Report Called to,Read Back By and Verified With: RENATA TURNAGE ON 07/12/2014 AT 9:45P BY WILEJ Performed at [facility]    Report Status 07/13/2014 FINAL  Final  Urine culture     Status: None   Collection Time: 07/11/14  8:44 PM  Result Value Ref Range Status   Specimen Description URINE, CATHETERIZED  Final   Special Requests NONE  Final   Culture  Setup Time   Final    07/12/2014 08:17 Performed at [place] Performed at [facility]   Final   Culture NO GROWTH Performed at [facility]   Final   Report Status 07/13/2014 FINAL  Final  MRSA PCR Screening     Status: Abnormal   Collection Time: 07/12/14  2:10 AM  Result Value Ref Range Status   MRSA by PCR POSITIVE (A) NEGATIVE Final    Comment:        The GeneXpert MRSA Assay (FDA approved for NASAL specimens only), is one component of a comprehensive MRSA  colonization surveillance program. It is not intended to diagnose MRSA infection nor to guide or monitor treatment for MRSA infections. RESULT CALLED TO, READ BACK BY AND VERIFIED WITH: H.JOHNSON,RN AT 0330 ON 07/12/14 BY [email or web address]      Scheduled Meds: . allopurinol  100 mg Oral Daily  . antiseptic oral rinse  7 mL Mouth Rinse BID  . apixaban  5 mg Oral BID  . atorvastatin  40 mg Oral Daily  . aztreonam  2 g Intravenous 3 times per day  . Chlorhexidine Gluconate Cloth  6 each Topical Q0600  . furosemide  40 mg Intravenous Daily  . gabapentin  300 mg Oral TID  . guaiFENesin  600 mg Oral BID  . insulin aspart  0-9 Units Subcutaneous 6 times per day  . levofloxacin (LEVAQUIN) IV  750 mg Intravenous Q24H  . metoprolol tartrate  75 mg Oral BID  . mupirocin ointment  1 application Nasal BID  . pantoprazole  40 mg Oral Daily  . sodium chloride  3 mL Intravenous Q12H  . vancomycin  2,000 mg Intravenous Q24H   Continuous Infusions:

## 2014-07-14 NOTE — Progress Notes (Signed)
Ward for Vancomycin, Aztreonam, Levaquin Indication: rule out sepsis  See progress note from Gretta Arab, PharmD for full details.  Vancomycin trough is supratherapeutic 25.4 mcg/ml before 4th dose.  Plan:  Hold dose tonight  Decrease dose to 1500mg  IV q24h - starting 11/19 at 10:00  Follow renal function closely  Suggest narrowing spectrum  Peggyann Juba, PharmD, BCPS 07/14/2014 9:55 PM

## 2014-07-14 NOTE — Plan of Care (Signed)
Problem: Phase I Progression Outcomes Goal: Pain controlled with appropriate interventions Outcome: Completed/Met Date Met:  24-Dec-2013

## 2014-07-14 NOTE — Progress Notes (Signed)
[place] for Vancomycin, Aztreonam, Levaquin Indication: rule out sepsis  Allergies  Allergen Reactions  . Bactrim [Sulfamethoxazole-Trimethoprim] Other (See Comments)    Hyperkalemia (July 2015)  . Penicillins Hives    Patient Measurements: Height: 5\' 6"  (167.6 cm) Weight: (!) 357 lb (161.934 kg) IBW/kg (Calculated) : 59.3  Vital Signs: Temp: 98.2 F (36.8 C) (11/18 1300) Temp Source: Oral (11/18 0800) BP: 141/59 mmHg (11/18 1200) Pulse Rate: 64 (11/18 1300) Intake/Output from previous day: 11/17 0701 - 11/18 0700 In: 913 [I.V.:3; IV Piggyback:800] Out: 2675 [Urine:2675] Intake/Output from this shift: Total I/O In: -  Out: 400 [Urine:400]  Labs:  Recent Labs  07/12/14 0410 07/13/14 0425 07/14/14 0339  WBC 14.3* 14.6* 10.7*  HGB 8.7* 9.6* 8.9*  PLT 159 139* 155  CREATININE 1.42* 1.45* 1.54*   Estimated Creatinine Clearance: 56.1 mL/min (by C-G formula based on Cr of 1.54).   Microbiology: Recent Results (from the past 720 hour(s))  Blood Culture (routine x 2)     Status: None (Preliminary result)   Collection Time: 07/11/14  7:58 PM  Result Value Ref Range Status   Specimen Description BLOOD R WRIST  Final   Special Requests BOTTLES DRAWN AEROBIC ONLY 3ML  Final   Culture  Setup Time   Final    07/12/2014 03:31 Performed at [facility]    Culture   Final           BLOOD CULTURE RECEIVED NO GROWTH TO DATE CULTURE WILL BE HELD FOR 5 DAYS BEFORE ISSUING A FINAL NEGATIVE REPORT Performed at [facility]    Report Status PENDING  Incomplete  Blood Culture (routine x 2)     Status: None   Collection Time: 07/11/14  7:59 PM  Result Value Ref Range Status   Specimen Description BLOOD R HAND  Final   Special Requests BOTTLES DRAWN AEROBIC ONLY 3 ML  Final   Culture  Setup Time   Final    07/12/2014 03:31 Performed at [facility]    Culture   Final    STREPTOCOCCUS GROUP C Note: Gram Stain  Report Called to,Read Back By and Verified With: RENATA TURNAGE ON 07/12/2014 AT 9:45P BY WILEJ Performed at [facility]    Report Status 07/13/2014 FINAL  Final  Urine culture     Status: None   Collection Time: 07/11/14  8:44 PM  Result Value Ref Range Status   Specimen Description URINE, CATHETERIZED  Final   Special Requests NONE  Final   Culture  Setup Time   Final    07/12/2014 08:17 Performed at [place] Performed at [facility]   Final   Culture NO GROWTH Performed at [facility]   Final   Report Status 07/13/2014 FINAL  Final  MRSA PCR Screening     Status: Abnormal   Collection Time: 07/12/14  2:10 AM  Result Value Ref Range Status   MRSA by PCR POSITIVE (A) NEGATIVE Final    Comment:        The GeneXpert MRSA Assay (FDA approved for NASAL specimens only), is one component of a comprehensive MRSA colonization surveillance program. It is not intended to diagnose MRSA infection nor to guide or monitor treatment for MRSA infections. RESULT CALLED TO, READ BACK BY AND VERIFIED WITH: H.JOHNSON,RN AT 0330 ON 07/12/14 BY SHEA.W       Assessment: 51 yof brought to ED  with fever and weakness. 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 CXR shows questionable interstitial infection. Previous cultures from 9/13 - urine: Proteus miribalis (sensitive zosyn, bactrim only), Group C strep for which she received several days of IV vancomycin then PO levofloxacin.  11/15 >> Vancomycin >> 11/15 >> Aztreonam >> 11/15 >> Levofloxacin >> 11/15 >> Tamiflu >> 11/17  Today, 07/14/2014 : Day # 4 antibiotics  Tmax: 101.5  WBC: improved, 10.7  Renal: SCr increased to 1.54 (baseline ~1) CrCl 40 N, 56 CG  11/15 blood x2: 1/2 GroupC Strep  Goal of Therapy:  Vancomycin trough level 15-20 mcg/ml  Antibiotic doses appropriate for indication/weight/renal  function Eradication of infection  Plan:   Continue Vancomycin 2000mg  IV q24h Check trough at steady state Aztreonam 2g IV q8h Levaquin 750mg  IV q24h Follow up renal function & cultures, clinical course   Gretta Arab PharmD, BCPS Pager [phone] 07/14/2014 2:52 PM

## 2014-07-14 NOTE — Progress Notes (Signed)
CRITICAL VALUE ALERT  Critical value received:  Vancomycin 25.4  Date of notification:  07/14/2014  Time of notification:  2150  Critical value read back:Yes.    Nurse who received alert:  Edwyna Ready   MD notified (1st page): Schorr  Time of first page:  2157

## 2014-07-15 LAB — GLUCOSE, CAPILLARY
GLUCOSE-CAPILLARY: 134 mg/dL — AB (ref 70–99)
GLUCOSE-CAPILLARY: 137 mg/dL — AB (ref 70–99)
GLUCOSE-CAPILLARY: 154 mg/dL — AB (ref 70–99)
Glucose-Capillary: 103 mg/dL — ABNORMAL HIGH (ref 70–99)
Glucose-Capillary: 142 mg/dL — ABNORMAL HIGH (ref 70–99)
Glucose-Capillary: 144 mg/dL — ABNORMAL HIGH (ref 70–99)
Glucose-Capillary: 157 mg/dL — ABNORMAL HIGH (ref 70–99)

## 2014-07-15 LAB — CBC
HCT: 26.1 % — ABNORMAL LOW (ref 36.0–46.0)
HEMOGLOBIN: 8.5 g/dL — AB (ref 12.0–15.0)
MCH: 28.1 pg (ref 26.0–34.0)
MCHC: 32.6 g/dL (ref 30.0–36.0)
MCV: 86.1 fL (ref 78.0–100.0)
Platelets: 177 10*3/uL (ref 150–400)
RBC: 3.03 MIL/uL — AB (ref 3.87–5.11)
RDW: 16.2 % — ABNORMAL HIGH (ref 11.5–15.5)
WBC: 7.8 10*3/uL (ref 4.0–10.5)

## 2014-07-15 LAB — BASIC METABOLIC PANEL
Anion gap: 11 (ref 5–15)
BUN: 33 mg/dL — AB (ref 6–23)
CO2: 20 meq/L (ref 19–32)
Calcium: 8.7 mg/dL (ref 8.4–10.5)
Chloride: 109 mEq/L (ref 96–112)
Creatinine, Ser: 1.37 mg/dL — ABNORMAL HIGH (ref 0.50–1.10)
GFR calc Af Amer: 45 mL/min — ABNORMAL LOW (ref >90)
GFR, EST NON AFRICAN AMERICAN: 39 mL/min — AB (ref >90)
GLUCOSE: 104 mg/dL — AB (ref 70–99)
POTASSIUM: 4.6 meq/L (ref 3.7–5.3)
SODIUM: 140 meq/L (ref 137–147)

## 2014-07-15 LAB — MAGNESIUM: MAGNESIUM: 1.6 mg/dL (ref 1.5–2.5)

## 2014-07-15 MED ORDER — METRONIDAZOLE IN NACL 5-0.79 MG/ML-% IV SOLN
500.0000 mg | Freq: Three times a day (TID) | INTRAVENOUS | Status: DC
  Administered 2014-07-15 – 2014-07-17 (×6): 500 mg via INTRAVENOUS
  Filled 2014-07-15 (×8): qty 100

## 2014-07-15 NOTE — Progress Notes (Addendum)
Patient ID: Kelly [name], female   DOB: October 28, 1945, 68 y.o.   MRN: [id number]  [facility] PROGRESS NOTE  JASLYNN THOME OFB:[id number] DOB: 1946/07/09 DOA: 07/11/2014 PCP: Alvester Chou, NP   Brief narrative: 68 y.o. 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 Pt denied chest pain or shortness of breath on admission, no specific abd or urinary concerns. She reports lining at home, ambulates with walker.   In ER she was noted to be transiently hypotensive with SBP 98/32, lactic acid 2.5, T 101.7 F, leukocytosis 12.9, K 6.1. TRH asked to admit to SDU for sepsis secondary to L thigh cellulitis.   Assessment and Plan:    Active Problems:  Severe sepsis secondary to left thigh cellulitis and ? PNA (HCAP given recent hospitalization)  - sepsis criteria met with T 101.7 F, elevated lactic acid, tachycardia with HR 110 - 120's, WBC 12.9, hypotension - pt clinically stable this AM and reports feeling better, still febrile with T max 99.3 F over the past 24 hours  - continue broad spectrum ABX Vancomycin, Aztreonam, day #5, stop Levaquin today 11/19, start Flagyl for anaerobic coverage  - blood culture, urine culture, sputum culture pending, WBC trending down: 14.3 --> 14.6 --> 10.7 --> 7.9 - appreciate Plastic Surgery team assistance   Left thigh cellulitis - cellulitis is slight better, less TTP and less warmth to touch  - unable to do MRI due to weight limits - [place] with no  identifiable pocket of fluid  - appreciate plastic surgery team assistance, will consult ID for ABX management   Chronic diastolic CHF - last known EF 60% per 2 D ECHO in 2014 - weight on last discharge recorded was 318 lbs and now up to 355 lbs - 356 lbs - pt with significant LE edema and says its worse since last discharge 04/2014 - off IVF since 11/16, 2 D ECHO with normal systolic function Ef 78%, difficult to evaluate diastolic parameters  - monitor daily weight, strict I's and O's - avoid use of ACEI due to hyperkalemia and renal failure  - continue home dose lasix   Pulmonary embolism and DVT - diagnosed on June 2015 - continue Apixaban  Chronic kidney disease, stage 3 - Cr remains stable for now and even trending down slightly  - stopped IVF 11/16 due to concern of volume overload   Hyperkalemia - resolved and WNL this AM, will repeat BMP in AM  Hypomagnesemia - supplemented, WNL this AM  DM type 2 causing CKD stage 3, neuropathies  - A1C 6.7, continue SSI for now until oral intake improves   HTN - continue Lasix, cont Metoprolol  - continue Imdur per home medical regimen   Morbid obesity, BMI > 40  Anemia of chronic disease, CKD - no signs of active bleeding, repeat CBC in AM  DVT prophylaxis  Apixaban  Code Status: Full Family Communication: Pt and family at bedside Disposition Plan: continue with inpatient status, d/c telemetry   IV Access:    Peripheral IV Procedures and  diagnostic studies:    Dg Chest Port 1 View 26-Jul-2014 Slightly increasing peribronchial/interstitial thickening -question interstitial infection.  Medical Consultants:    Surgery, plastic - Dr.Sanger Other Consultants:    None  Anti-Infectives:    Levaquin July 27, 2023 --> 11/19  Aztreonam 27-Jul-2023 -->  Vancomycin Jul 27, 2023 -->  Flagyl 11/19 -->   Faye Ramsay, MD  [facility] Pager [phone]  If 7PM-7AM, please contact  night-coverage [email or web address] Password [email or web address] 07/15/2014, 11:54 AM   LOS: 4 days   HPI/Subjective: No events overnight.   Objective: Filed Vitals:   07/14/14 1300 07/14/14 2141 07/15/14 0210 07/15/14 0534  BP:  172/68 158/61 164/62  Pulse: 64 69 68 65  Temp: 98.2 F (36.8 C) 98.3 F (36.8 C) 98.2 F (36.8 C) 98 F (36.7 C)  TempSrc:  Oral Oral Oral  Resp: 12 16 18 16   Height:      Weight:    161.8 kg (356 lb 11.3 oz)  SpO2: 100% 100% 100% 100%    Intake/Output Summary (Last 24 hours) at 07/15/14 1154 Last data filed at 07/14/14 1700  Gross per 24 hour  Intake    120 ml  Output    350 ml  Net   -230 ml    Exam:   General:  Pt is alert, follows commands appropriately, not in acute distress  Cardiovascular: Regular rate and rhythm, S1/S2  Respiratory: Clear to auscultation bilaterally, no wheezing, diminished air movement at bases   Abdomen: Soft, non tender, non distended, bowel sounds present, no guarding  Extremities: inner thigh cellulitis, still TTP but less warm, left lateral thigh and hip cellulitis improving with less TTP   Neuro: Grossly nonfocal  Data Reviewed: Basic Metabolic Panel:  Recent Labs Lab Jul 26, 2014 1959 07/26/14 2258 07/12/14 0410 07/13/14 0425 07/14/14 0339 07/15/14 0439  NA 136*  --  138 137 136* 140  K 6.1* 5.5* 5.0 5.2 4.6 4.6  CL 102  --  107 105 106 109  CO2 19  --  20 17* 18* 20  GLUCOSE 155*  --  170* 99 105* 104*  BUN 40*  --  37* 34* 34* 33*  CREATININE 1.42*  --  1.42* 1.45* 1.54* 1.37*  CALCIUM 9.5  --  8.4 8.5 8.4 8.7  MG  --   --  1.3*  --  1.6 1.6   Liver Function Tests:  Recent Labs Lab 2014-07-26 1959 07/12/14 0410  AST 24 13  ALT 16 11  ALKPHOS 135* 93  BILITOT 0.4 0.3  PROT 9.3* 6.8  ALBUMIN 2.9* 2.0*   CBC:  Recent Labs Lab 2014-07-26 1959 07/12/14 0410 07/13/14 0425 07/14/14 0339 07/15/14 0439  WBC 12.9* 14.3* 14.6* 10.7* 7.8  NEUTROABS 12.0* 13.5*  --   --   --   HGB 11.4* 8.7* 9.6* 8.9*  8.5*  HCT 35.9* 27.4* 29.0* 27.5* 26.1*  MCV 88.0 88.7 86.1 86.5 86.1  PLT 187 159 139* 155 177   Cardiac Enzymes:  Recent Labs Lab 07/26/2014 1959  TROPONINI <0.30   CBG:  Recent Labs Lab 07/14/14 1137 07/14/14 1658 07/15/14 0029 07/15/14 0401 07/15/14 0821  GLUCAP 178* 165* 137* 134* 103*    Recent Results (from the past 240 hour(s))  Blood Culture (routine x 2)     Status: None (Preliminary result)   Collection Time: Jul 26, 2014  7:58 PM  Result Value Ref Range Status   Specimen Description BLOOD R WRIST  Final   Special Requests BOTTLES DRAWN AEROBIC ONLY 3ML  Final  Culture  Setup Time   Final    07/12/2014 03:31 Performed at [facility]    Culture   Final           BLOOD CULTURE RECEIVED NO GROWTH TO DATE CULTURE WILL BE HELD FOR 5 DAYS BEFORE ISSUING A FINAL NEGATIVE REPORT Performed at [facility]    Report Status PENDING  Incomplete  Blood Culture (routine x 2)     Status: None   Collection Time: 07/11/14  7:59 PM  Result Value Ref Range Status   Specimen Description BLOOD R HAND  Final   Special Requests BOTTLES DRAWN AEROBIC ONLY 3 ML  Final   Culture  Setup Time   Final    07/12/2014 03:31 Performed at [facility]    Culture   Final    STREPTOCOCCUS GROUP C Note: Gram Stain Report Called to,Read Back By and Verified With: RENATA TURNAGE ON 07/12/2014 AT 9:45P BY WILEJ Performed at [facility]    Report Status 07/13/2014 FINAL  Final  Urine culture     Status: None   Collection Time: 07/11/14  8:44 PM  Result Value Ref Range Status   Specimen Description URINE, CATHETERIZED  Final   Special Requests NONE  Final   Culture  Setup Time   Final    07/12/2014 08:17 Performed at [place] Performed at [facility]   Final   Culture NO GROWTH Performed at [facility]   Final   Report Status 07/13/2014 FINAL  Final  MRSA PCR Screening     Status: Abnormal    Collection Time: 07/12/14  2:10 AM  Result Value Ref Range Status   MRSA by PCR POSITIVE (A) NEGATIVE Final    Comment:        The GeneXpert MRSA Assay (FDA approved for NASAL specimens only), is one component of a comprehensive MRSA colonization surveillance program. It is not intended to diagnose MRSA infection nor to guide or monitor treatment for MRSA infections. RESULT CALLED TO, READ BACK BY AND VERIFIED WITH: H.JOHNSON,RN AT 0330 ON 07/12/14 BY SHEA.W      Scheduled Meds: . allopurinol  100 mg Oral Daily  . antiseptic oral rinse  7 mL Mouth Rinse BID  . apixaban  5 mg Oral BID  . atorvastatin  40 mg Oral Daily  . aztreonam  2 g Intravenous 3 times per day  . Chlorhexidine Gluconate Cloth  6 each Topical Q0600  . furosemide  40 mg Oral Daily  . gabapentin  300 mg Oral TID  . guaiFENesin  600 mg Oral BID  . insulin aspart  0-9 Units Subcutaneous TID WC  . isosorbide mononitrate  10 mg Oral BID  . levofloxacin (LEVAQUIN) IV  750 mg Intravenous Q24H  . metolazone  5 mg Oral q morning - 10a  . metoprolol tartrate  75 mg Oral BID  . mupirocin ointment  1 application Nasal BID  . pantoprazole  40 mg Oral Daily  . polyethylene glycol  17 g Oral BID  . senna  1 tablet Oral BID  . sodium chloride  3 mL Intravenous Q12H  . vancomycin  1,500 mg Intravenous Q24H   Continuous Infusions:

## 2014-07-15 NOTE — Progress Notes (Signed)
CARE MANAGEMENT NOTE 07/15/2014  Patient:  Kelly [name], Kelly [name]   Account Number:  [id number]  Date Initiated:  07/13/2014  Documentation initiated by:  DAVIS,RHONDA  Subjective/Objective Assessment:   sepsis recurrent entercoccus     Action/Plan:   home may require snf for sepsis recurrent and to ensure good skin ands wound care/patient was   Anticipated DC Date:  07/16/2014   Anticipated DC Plan:  [place] referral  Clinical Social Worker      DC Planning Services  CM consult      [facility] Choice  NA   Choice offered to / List presented to:  NA   DME arranged  NA      DME agency  NA     [place] arranged  NA      [place] agency  NA   Status of service:  In process, will continue to follow Medicare Important Message given?  YES (If response is "NO", the following Medicare IM given date fields will be blank) Date Medicare IM given:  07/15/2014 Medicare IM given by:  Edwyna Shell Date Additional Medicare IM given:   Additional Medicare IM given by:    Discharge Disposition:    Per UR Regulation:  Reviewed for med. necessity/level of care/duration of stay  If discussed at [place] of Stay Meetings, dates discussed:    Comments:  07/15/14 Edwyna Shell RN, BSN, CM Patient states that she lives at home with her nephew, she has a rolling walker, shower chair, and hospital bed, her PCP makes home visits and her medications are delivered. The patient stated that she was discharged home from [facility] for rehab on 10/18 and was followed in the home by [place] and PT and if [facility] services are appropriate after discharge she would like to remain with [facility].  [id number] Rosana Hoes, RN, BSN, CCM Chart reviewed. Discharge needs and patient's stay to be reviewed and followed by case manager. Chart note for progression of stay: 68 y.o. 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

## 2014-07-15 NOTE — Consult Note (Signed)
WOC wound consult note Reason for Consult: Intertriginous Dermatitis (ITD) in the inframammary and pannicular skin folds. Wound type:Moisture Associated Skin Damage (MASD), specifically intertriginous dermatitis (ITD) Pressure Ulcer POA: No Dressing procedure/placement/frequency: Nursing staff requesting antimicrobial textile for [place] in the intertriginous areas beneath the breasts and the abdominal skin folds. Orders provided for skin care using InterDry Ag+ in these areas. [place] nursing team will not follow, but will remain available to this patient, the nursing and medical teams.  Please re-consult if needed. Thanks, Maudie Flakes, MSN, RN, [place], [place], [place] [phone])

## 2014-07-16 DIAGNOSIS — A408 Other streptococcal sepsis: Principal | ICD-10-CM

## 2014-07-16 DIAGNOSIS — R7881 Bacteremia: Secondary | ICD-10-CM

## 2014-07-16 LAB — GLUCOSE, CAPILLARY
GLUCOSE-CAPILLARY: 122 mg/dL — AB (ref 70–99)
GLUCOSE-CAPILLARY: 135 mg/dL — AB (ref 70–99)
Glucose-Capillary: 122 mg/dL — ABNORMAL HIGH (ref 70–99)
Glucose-Capillary: 133 mg/dL — ABNORMAL HIGH (ref 70–99)
Glucose-Capillary: 136 mg/dL — ABNORMAL HIGH (ref 70–99)

## 2014-07-16 LAB — CBC
HCT: 27 % — ABNORMAL LOW (ref 36.0–46.0)
Hemoglobin: 8.7 g/dL — ABNORMAL LOW (ref 12.0–15.0)
MCH: 28.1 pg (ref 26.0–34.0)
MCHC: 32.2 g/dL (ref 30.0–36.0)
MCV: 87.1 fL (ref 78.0–100.0)
Platelets: 202 10*3/uL (ref 150–400)
RBC: 3.1 MIL/uL — AB (ref 3.87–5.11)
RDW: 16 % — AB (ref 11.5–15.5)
WBC: 8 10*3/uL (ref 4.0–10.5)

## 2014-07-16 LAB — BASIC METABOLIC PANEL
ANION GAP: 13 (ref 5–15)
BUN: 32 mg/dL — ABNORMAL HIGH (ref 6–23)
CO2: 20 meq/L (ref 19–32)
Calcium: 8.7 mg/dL (ref 8.4–10.5)
Chloride: 105 mEq/L (ref 96–112)
Creatinine, Ser: 1.29 mg/dL — ABNORMAL HIGH (ref 0.50–1.10)
GFR, EST AFRICAN AMERICAN: 49 mL/min — AB (ref >90)
GFR, EST NON AFRICAN AMERICAN: 42 mL/min — AB (ref >90)
Glucose, Bld: 137 mg/dL — ABNORMAL HIGH (ref 70–99)
Potassium: 4.6 mEq/L (ref 3.7–5.3)
SODIUM: 138 meq/L (ref 137–147)

## 2014-07-16 MED ORDER — METOLAZONE 5 MG PO TABS
5.0000 mg | ORAL_TABLET | Freq: Once | ORAL | Status: AC
  Administered 2014-07-16: 5 mg via ORAL
  Filled 2014-07-16: qty 1

## 2014-07-16 MED ORDER — GI COCKTAIL ~~LOC~~
30.0000 mL | Freq: Three times a day (TID) | ORAL | Status: DC | PRN
  Administered 2014-07-17: 30 mL via ORAL
  Filled 2014-07-16 (×2): qty 30

## 2014-07-16 MED ORDER — METOLAZONE 5 MG PO TABS
5.0000 mg | ORAL_TABLET | ORAL | Status: DC
  Administered 2014-07-17 – 2014-07-19 (×3): 5 mg via ORAL
  Filled 2014-07-16 (×4): qty 1

## 2014-07-16 MED ORDER — GI COCKTAIL ~~LOC~~
30.0000 mL | Freq: Once | ORAL | Status: AC
  Administered 2014-07-16: 30 mL via ORAL
  Filled 2014-07-16: qty 30

## 2014-07-16 MED ORDER — SIMETHICONE 80 MG PO CHEW
160.0000 mg | CHEWABLE_TABLET | Freq: Four times a day (QID) | ORAL | Status: DC | PRN
  Administered 2014-07-16 – 2014-07-17 (×3): 160 mg via ORAL
  Filled 2014-07-16 (×4): qty 2

## 2014-07-16 NOTE — Evaluation (Signed)
Physical Therapy Evaluation Patient Details Name: Kelly [name] MRN: [id number] DOB: 1945/11/13 Today's Date: 07/16/2014   History of Present Illness  52-yo female admitted  07/11/14   with fever and LLE pain/cellulitis.   PMHx:  chronic diastolic heart failure, hypertension, hyperlipidemia, diabetes mellitus type 2, obstructive sleep apnea, GERD, depression, chronic lymphedema, and indwelling Foley catheter. Recently had I/D  L thigh wound  in 6/15 with multiple complications  including PE. Patient 's leg wound being managed by Wound center.  Clinical Impression  Pt admitted with cellulitis/sepsis. Pt currently with functional limitations due to the deficits listed below (see PT Problem List). * Pt will benefit from skilled PT to increase their independence and safety with mobility to allow discharge to the venue listed below.  Pt is planning to go home with HHPT;     Follow Up Recommendations  HHPT    Equipment Recommendations  3in1 (PT);Wheelchair (measurements PT);Hospital bed (needs bariatric hospital bed, w/c and 3in1)    Recommendations for Other Services       Precautions / Restrictions Precautions Precautions: Fall      Mobility  Bed Mobility Overal bed mobility: +2 for physical assistance;+ 2 for safety/equipment Bed Mobility: Supine to Sit     Supine to sit: +2 for physical assistance;Max assist;+2 for safety/equipment     General bed mobility comments: +2 for trunk and to get LEs off bed, difficulty partially due to bed mattress--mattress needing to be more firm for pt to move but then edges are higher than middle making a "trough" and also making ir more difficult for pt to assist self  Transfers Overall transfer level: Needs assistance Equipment used: Rolling walker (2 wheeled) Transfers: Sit to/from [facility] Sit to Stand: +2 physical assistance;Min assist;+2 safety/equipment;Mod assist Stand pivot transfers: +2 physical assistance;Min  assist;+2 safety/equipment       General transfer comment: bed softened for pt feet to reach floor; +2 for anterior superior wt shift and control of descent; pt relies on momentum to stand  Ambulation/Gait             General Gait Details: stand pivot only today  Stairs            Wheelchair Mobility    Modified Rankin (Stroke Patients Only)       Balance Overall balance assessment: Needs assistance Sitting-balance support: Feet supported;No upper extremity supported Sitting balance-Leahy Scale: Good     Standing balance support: Bilateral upper extremity supported;During functional activity Standing balance-Leahy Scale: Poor                               Pertinent Vitals/Pain Pain Assessment: 0-10 Pain Score: 3  Pain Location: LLE, diffuse, pt nonspecific Pain Intervention(s): Limited activity within patient's tolerance;Monitored during session    Home Living Family/patient expects to be discharged to:: Private residence Living Arrangements: Other relatives Available Help at Discharge: Family Type of Home: Apartment Home Access: Level entry     Home Layout: One level   Additional Comments: working on getting a bariatric bed and  w/c     Prior Function Level of Independence: Needs assistance   Gait / Transfers Assistance Needed: pt reports she could ambulate short distances with RW and HHPT prior to adm           Hand Dominance        Extremity/Trunk Assessment   Upper Extremity Assessment: Defer to OT evaluation  Lower Extremity Assessment: Generalized weakness;RLE deficits/detail;LLE deficits/detail RLE Deficits / Details: pt limited in ROM by soft tissue approximation, body habitus; grossly 3/5 LLE Deficits / Details: pt limited in ROM by soft tissue approximation, body habitus; grossly 3 to 3+/5     Communication   Communication: No difficulties  Cognition Arousal/Alertness: Awake/alert Behavior During  Therapy: WFL for tasks assessed/performed Overall Cognitive Status: Within Functional Limits for tasks assessed                      General Comments      Exercises        Assessment/Plan    PT Assessment Patient needs continued PT services  PT Diagnosis Difficulty walking;Generalized weakness   PT Problem List Decreased strength;Decreased activity tolerance;Decreased mobility  PT Treatment Interventions DME instruction;Gait training;Functional mobility training;Therapeutic activities;Therapeutic exercise;Patient/family education   PT Goals (Current goals can be found in the Care Plan section) Acute Rehab PT Goals Patient Stated Goal: pt wants to go home PT Goal Formulation: With patient Time For Goal Achievement: 07/23/14 Potential to Achieve Goals: Fair    Frequency Min 4X/week (incr frequency d/t going home, has no SNF days)   Barriers to discharge        Co-evaluation               End of Session Equipment Utilized During Treatment: Gait belt Activity Tolerance: Patient limited by fatigue Patient left: in chair;with call bell/phone within reach Nurse Communication: Mobility status         Time: 2080-2233 PT Time Calculation (min) (ACUTE ONLY): 58 min   Charges:   PT Evaluation $Initial PT Evaluation Tier I: 1 Procedure PT Treatments $Therapeutic Activity: 53-67 mins   PT G Codes:          Krissia Schreier Aug 04, 2014, 1:17 PM

## 2014-07-16 NOTE — Progress Notes (Signed)
Clinical Social Work Department BRIEF PSYCHOSOCIAL ASSESSMENT 07/16/2014  Patient:  Kelly [name],Kelly [name]     Account Number:  [id number]     Admit date:  07/11/2014  Clinical Social Worker:  GERBER,HOLLY, LCSW  Date/Time:  07/16/2014 11:50 AM  Referred by:  Physician  Date Referred:  07/16/2014 Referred for  SNF Placement   Other Referral:   Interview type:  Patient Other interview type:    PSYCHOSOCIAL DATA Living Status:  FAMILY Admitted from facility:   Level of care:   Primary support name:  Charles Primary support relationship to patient:  FAMILY Degree of support available:   Strong    CURRENT CONCERNS Current Concerns  Post-Acute Placement   Other Concerns:    SOCIAL WORK ASSESSMENT / PLAN CSW received referral in order to assist with DC planning. CSW reviewed chart and met with patient at bedside. CSW introduced myself and explained role.    Patient reports she lives at home with nephew. Patient recently DC from [facility] in October and reports she used all Medicare days for SNF. Patient reports she has good support at home and lots of equipment. Patient thanked CSW for visit but reports she does not need any information for SNF placement because she will only DC home. Patient reports no safety concerns and reports that niece will transport her home when she is DC from the hospital.    CSW is signing off but available if further needs arise.   Assessment/plan status:  No Further Intervention Required Other assessment/ plan:   Information/referral to community resources:   SNF information    PATIENT'S/FAMILY'S RESPONSE TO PLAN OF CARE: Patient alert and oriented. Patient reports she is grateful for family support and knows that she will be safe at home. Patient reports that she has been to SNF in the past and she did feel it was helpful but she is not wanting to return at DC. Patient reports no further concerns but thanked CSW for visit.     Holly Gerber, LCSW  [phone]  

## 2014-07-16 NOTE — Progress Notes (Signed)
CARE MANAGEMENT NOTE 07/16/2014  Patient:  Kelly [name], Kelly [name]   Account Number:  [id number]  Date Initiated:  07/13/2014  Documentation initiated by:  DAVIS,RHONDA  Subjective/Objective Assessment:   sepsis recurrent entercoccus     Action/Plan:   home may require snf for sepsis recurrent and to ensure good skin ands wound care/patient was   Anticipated DC Date:  07/16/2014   Anticipated DC Plan:  [place] referral  Clinical Social Worker      DC Planning Services  CM consult      [facility] Choice  NA   Choice offered to / List presented to:  NA   DME arranged  NA      DME agency  NA     [place] arranged  NA      [place] agency  NA   Status of service:  In process, will continue to follow Medicare Important Message given?  YES (If response is "NO", the following Medicare IM given date fields will be blank) Date Medicare IM given:  07/15/2014 Medicare IM given by:  Edwyna Shell Date Additional Medicare IM given:   Additional Medicare IM given by:    Discharge Disposition:    Per UR Regulation:  Reviewed for med. necessity/level of care/duration of stay  If discussed at Circle of Stay Meetings, dates discussed:    Comments:  07/16/14 Edwyna Shell RN, BSN, CM Spoke with [facility] DME rep and she stated that based on weight the patient is not eligible for a bariatric bed.  07/15/14 Edwyna Shell RN, BSN, CM Patient states that she lives at home with her nephew, she has a rolling walker, shower chair, and hospital bed, her PCP makes home visits and her medications are delivered. The patient stated that she was discharged home from [facility] for rehab on 10/18 and was followed in the home by [place] and PT and if [facility] services are appropriate after discharge she would like to remain with [facility].  [id number] Rosana Hoes, RN, BSN, CCM Chart reviewed. Discharge needs and patient's stay to be reviewed and followed by case  manager. Chart note for progression of stay: 68 y.o. 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

## 2014-07-16 NOTE — Progress Notes (Signed)
Patient ID: Kelly [name], female   DOB: 1946-04-21, 68 y.o.   MRN: [id number] [facility] PROGRESS NOTE  Kelly [name] BWG:[id number] DOB: 05-17-1946 DOA: 07/11/2014 PCP: Alvester Chou, NP  Brief narrative: 68 y.o. 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 Pt denied chest pain or shortness of breath on admission, no specific abd or urinary concerns. She reports lining at home, ambulates with walker.   In ER she was noted to be transiently hypotensive with SBP 98/32, lactic acid 2.5, T 101.7 F, leukocytosis 12.9, K 6.1. Patient was admitted to SDU for sepsis secondary to L thigh cellulitis. Hospital course complicated with streptococcus bacteremia (1 of the blood cultures is growing Strep C).  Assessment and Plan:    Principal Problem:  Severe sepsis secondary to left thigh cellulitis / streptococcus bacteremia / leukocytosis  - sepsis criteria met with T 101.7 F, elevated lactic acid, tachycardia with HR 110 - 120's, WBC 12.9, hypotension. In addition, patient has evidence of infection, source left thigh cellulitis. Please note blood culture from 07/11/2014 (1 of the blood cultures) is growing streptococcus C. Will repeat blood cultures today to see if bacteremia cleared up. - patient remains afebrile in past 48 hours. WBC count normalized.  - continue broad spectrum ABX Vancomycin, Aztreonam, day #5, stopped Levaquin 07/15/14. Flagyl was added for anaerobic  coverage.  - appreciate Plastic Surgery team assistance  - will ask ID for input on bacteremia management  Active problems:  Left thigh cellulitis - cellulitis seems to be improving, less TTP and less warmth to touch  - unable to do MRI due to weight limits - [place] with no identifiable pocket of fluid  - appreciate plastic surgery team assistance  Chronic diastolic CHF - last known EF 60% per 2 D ECHO in 2014. 2 D ECHO on this admission showed normal systolic function but study not adequate to evaluate diastolic function  - weight on last discharge recorded was 318 lbs and now up to 355 lbs - 356 lbs - pt has significant LE edema and says its worse since last discharge 04/2014 - IV fluids stopped 07/12/2014 - monitor daily weight, strict intake and outptu; replete electrolytes as needed - avoid use of ACEI due to hyperkalemia and renal failure  - continue home dose lasix 40 mg daily   Pulmonary embolism and DVT - diagnosed on June 2015 - on anticoagulation with apixaban  Chronic kidney disease, stage 3 / metabolic acidosis  - Cr remains stable with overall downward trend since admission - stopped IVF 11/16 due to concern of volume overload   Hyperkalemia - resolved and remains WNL  Hypomagnesemia - supplemented, WNL  DM type 2 causing CKD stage 3, neuropathies  - A1C 6.7, continue SSI for now until oral intake improves   HTN - continue Lasix, cont Metoprolol  - continue Imdur per home medical regimen   Morbid obesity, BMI > 40 - Counseled on diet  Anemia of chronic disease, CKD - no signs of active bleeding -  hemoglobin is stable at 8.7 - no current indications for transfusion   DVT prophylaxis  Apixaban  Code Status: Full Family Communication: Pt and family at bedside Disposition Plan: continue with inpatient status    IV Access:   Peripheral IV Procedures and diagnostic studies:     Dg Chest Port 1 View 07/11/2014 Slightly increasing  peribronchial/interstitial thickening -question interstitial infection. Medical Consultants:   Surgery, plastic - Dr.Sanger ID - phone call only Other Consultants:   Physical therapy  Anti-Infectives:    Levaquin 11/15 --> 11/19  Aztreonam 11/15 -->  Vancomycin 11/15 -->  Flagyl 11/19 -->    Faye Ramsay, MD  [facility] Pager [phone]  If 7PM-7AM, please contact night-coverage [email or web address] Password [facility] 07/16/2014, 9:53 AM   LOS: 5 days   HPI/Subjective: No events overnight.   Objective: Filed Vitals:   07/15/14 1900 07/15/14 2135 07/16/14 0228 07/16/14 0553  BP: 178/59 169/45 151/53 149/52  Pulse: 73 79 68 70  Temp:  98.2 F (36.8 C) 98.4 F (36.9 C) 98.5 F (36.9 C)  TempSrc:  Oral Oral Oral  Resp:   16 18  Height:      Weight:      SpO2:  97% 97% 98%    Intake/Output Summary (Last 24 hours) at 07/16/14 0953 Last data filed at 07/16/14 0513  Gross per 24 hour  Intake    480 ml  Output   2351 ml  Net  -1871 ml    Exam:  General: Pt is not in acute distress  Cardiovascular: Regular rate and rhythm, S1/S2 appreciated   Respiratory: bilateral air entry, no wheezing, diminished air movement at bases   Abdomen: non tender, non distended, bowel sounds present  Extremities: inner thigh cellulitis, TTP with warmth to touch, left lateral thigh and hip cellulitis improving although still TTP  Neuro: No focal neurologic deficits   Data Reviewed: Basic Metabolic Panel:  Recent Labs Lab 07/12/14 0410 07/13/14 0425 07/14/14 0339 07/15/14 0439 07/16/14 0500  NA 138 137 136* 140 138  K 5.0 5.2 4.6 4.6 4.6  CL 107 105 106 109 105  CO2 20 17* 18* 20 20  GLUCOSE 170* 99 105* 104* 137*  BUN 37* 34* 34* 33* 32*  CREATININE 1.42* 1.45* 1.54* 1.37* 1.29*  CALCIUM 8.4 8.5 8.4 8.7 8.7  MG 1.3*  --  1.6 1.6  --    Liver Function Tests:  Recent Labs Lab 07/11/14 1959 07/12/14 0410  AST 24 13  ALT 16 11  ALKPHOS 135* 93  BILITOT 0.4 0.3   PROT 9.3* 6.8  ALBUMIN 2.9* 2.0*   No results for input(s): LIPASE, AMYLASE in the last 168 hours. No results for input(s): AMMONIA in the last 168 hours. CBC:  Recent Labs Lab 07/11/14 1959 07/12/14 0410 07/13/14 0425 07/14/14 0339 07/15/14 0439 07/16/14 0500  WBC 12.9* 14.3* 14.6* 10.7* 7.8 8.0  NEUTROABS 12.0* 13.5*  --   --   --   --   HGB 11.4* 8.7* 9.6* 8.9* 8.5* 8.7*  HCT 35.9* 27.4* 29.0* 27.5* 26.1* 27.0*  MCV 88.0 88.7 86.1 86.5 86.1 87.1  PLT 187 159 139* 155 177 202   Cardiac Enzymes:  Recent Labs Lab 07/11/14 1959  TROPONINI <0.30   BNP: Invalid input(s): POCBNP CBG:  Recent Labs Lab 07/15/14 1629 07/15/14 2049 07/16/14 0001 07/16/14 0527 07/16/14 0735  GLUCAP 157* 154* 144* 122* 135*    Blood Culture (routine x 2)     Status: None (Preliminary result)  Collection Time: 07/11/14  7:58 PM  Result Value Ref [place] Status   Specimen Description BLOOD R WRIST  Final   Special Requests BOTTLES DRAWN AEROBIC ONLY   Final   Culture  Setup Time   Final   Culture   Final           BLOOD CULTURE RECEIVED NO GROWTH TO DATE    Report Status PENDING  Incomplete  Blood Culture (routine x 2)     Status: None   Collection Time: 07/11/14  7:59 PM  Result Value Ref Range Status   Specimen Description BLOOD R HAND  Final   Special Requests BOTTLES DRAWN AEROBIC ONLY 3   Final   Culture  Setup Time   Final   Culture   Final    STREPTOCOCCUS GROUP    Report Status 07/13/2014 FINAL  Final  Urine culture     Status: None   Collection Time: 07/11/14  8:44 PM  Result Value Ref Range Status   Specimen Description URINE, CATHETERIZED  Final   Special Requests NONE  Final   Culture  Setup Time   Final   Culture NO GROWTH  Final   Report Status 07/13/2014 FINAL  Final  MRSA PCR Screening     Status: Abnormal   Collection Time: 07/12/14  2:10 AM  Result Value Ref Range Status   MRSA by PCR POSITIVE (A) NEGATIVE Final    Comment:           Scheduled  Meds: . allopurinol  100 mg Oral Daily  . apixaban  5 mg Oral BID  . atorvastatin  40 mg Oral Daily  . aztreonam  2 g Intravenous 3 times per day  . furosemide  40 mg Oral Daily  . gabapentin  300 mg Oral TID  . guaiFENesin  600 mg Oral BID  . insulin aspart  0-9 Units Subcutaneous TID WC  . isosorbide mononitrate  10 mg Oral BID  . metolazone  5 mg Oral Q24H  . metoprolol tartrate  75 mg Oral BID  . metronidazole  500 mg Intravenous Q8H  . mupirocin ointment  1 application Nasal BID  . pantoprazole  40 mg Oral Daily  . polyethylene glycol  17 g Oral BID  . senna  1 tablet Oral BID  . vancomycin  1,500 mg Intravenous Q24H

## 2014-07-17 LAB — GLUCOSE, CAPILLARY
GLUCOSE-CAPILLARY: 145 mg/dL — AB (ref 70–99)
GLUCOSE-CAPILLARY: 118 mg/dL — AB (ref 70–99)
GLUCOSE-CAPILLARY: 133 mg/dL — AB (ref 70–99)
Glucose-Capillary: 131 mg/dL — ABNORMAL HIGH (ref 70–99)
Glucose-Capillary: 150 mg/dL — ABNORMAL HIGH (ref 70–99)
Glucose-Capillary: 155 mg/dL — ABNORMAL HIGH (ref 70–99)

## 2014-07-17 MED ORDER — LOPERAMIDE HCL 2 MG PO CAPS
2.0000 mg | ORAL_CAPSULE | ORAL | Status: DC | PRN
  Administered 2014-07-17: 2 mg via ORAL
  Filled 2014-07-17: qty 1

## 2014-07-17 NOTE — Progress Notes (Signed)
Patient ID: Kelly [name], female   DOB: Apr 01, 1946, 68 y.o.   MRN: [id number]  [facility] PROGRESS NOTE  Kelly [name]:[id number] DOB: 10/08/1945 DOA: 07/11/2014 PCP: Alvester Chou, NP   Brief narrative: 68 y.o. 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 Pt denied chest pain or shortness of breath on admission, no specific abd or urinary concerns. She reports lining at home, ambulates with walker.   In ER she was noted to be transiently hypotensive with SBP 98/32, lactic acid 2.5, T 101.7 F, leukocytosis 12.9, K 6.1. Patient was admitted to SDU for sepsis secondary to L thigh cellulitis. Hospital course complicated with streptococcus bacteremia (1 of the blood cultures is growing Strep C).  Assessment and Plan:    Principal Problem:  Severe sepsis secondary to left thigh cellulitis / streptococcus bacteremia / leukocytosis  - sepsis criteria met with T 101.7 F, elevated lactic acid, tachycardia with HR 110 - 120's, WBC 12.9, hypotension. In addition, patient has evidence of infection, source left thigh cellulitis. Please note blood culture from 07/11/2014 (1 of the blood cultures) is growing streptococcus C. Will repeat blood cultures today to see if bacteremia cleared up. - patient remains afebrile in past 72 hours. WBC count normalized.  - pt initially started  on Vancomycin, Aztreonam, Levaquin 07/11/2014  - today is day #6 for Vanc and Aztreonam, we stopped Levaquin 07/15/14. Flagyl was added for anaerobic coverage 11/19 - since we have isolated micro organism group C strep, will narrow ABX regimen to Vancomycin only  - continue to monitor progress and change to oral ABX once continues improvement documented and clearance of the infection from the blood  - appreciate Plastic Surgery team assistance  - ABX regimen d/c with ID doctor, Dr. Baxter Flattery  Active problems:  Left thigh cellulitis - cellulitis seems to be improving, less TTP and less warmth to touch  - unable to do MRI due to weight limits - [place] with no identifiable pocket of fluid  - appreciate plastic surgery team assistance - continue ABX as noted above   Chronic diastolic CHF - last known EF 60% per 2 D ECHO in 2014. 2 D ECHO on this admission showed normal systolic function but study not adequate to evaluate diastolic function  - weight on last discharge recorded was 318 lbs and now up to 355 lbs - 356 lbs - weight trending down: 324 lbs (11/21) which is closer to pt's baseline  - pt has significant LE edema and says its worse since last discharge 04/2014 - IV fluids stopped 07/12/2014 - monitor daily weight, strict intake and outptu; replete electrolytes as needed - avoid use of ACEI due to hyperkalemia and renal failure  - continue home dose lasix 40 mg daily   Pulmonary embolism and DVT - diagnosed on June 2015 - on anticoagulation with apixaban  Chronic kidney disease, stage 3 / metabolic acidosis  - Cr remains  stable with overall downward trend since admission - stopped IVF 11/16 due to concern of volume overload   Hyperkalemia - resolved and remains WNL  Hypomagnesemia - supplemented, WNL  DM type 2 causing CKD stage 3, neuropathies  - A1C 6.7, continue SSI for now until oral intake improves   HTN - continue Lasix, cont Metoprolol  - continue Imdur per  home medical regimen   Morbid obesity, BMI > 40 - Counseled on diet  Anemia of chronic disease, CKD - no signs of active bleeding - hemoglobin is stable at 8.7 - no current indications for transfusion   DVT prophylaxis  Apixaban  Code Status: Full Family Communication: Pt and family at bedside Disposition Plan: continue with inpatient status  IV Access:    Peripheral IV Procedures and diagnostic studies:    Dg Chest Port 1 View Aug 10, 2014 Slightly increasing peribronchial/interstitial thickening -question interstitial infection. Medical Consultants:    Surgery, plastic - Dr.Sanger  ID - phone call only Other Consultants:    Physical therapy  Anti-Infectives:    Levaquin 2023-08-11 --> 11/19  Aztreonam 2023-08-11 --> 11/21  Vancomycin 2023-08-11 -->  Flagyl 11/19 --> 11/21   Kelly Ramsay, MD  [facility] Pager [phone]  If 7PM-7AM, please contact night-coverage [email or web address] Password [email or web address] 07/17/2014, 1:00 PM   LOS: 6 days   HPI/Subjective: No events overnight.   Objective: Filed Vitals:   07/16/14 1644 07/16/14 2111 07/17/14 0442 07/17/14 0804  BP: 169/63 163/60 160/51   Pulse:  63 74   Temp:  98.2 F (36.8 C) 98.8 F (37.1 C)   TempSrc:  Oral Oral   Resp:  16 20   Height:      Weight:    146.965 kg (324 lb)  SpO2:  100% 94%     Intake/Output Summary (Last 24 hours) at 07/17/14 1300 Last data filed at 07/17/14 0448  Gross per 24 hour  Intake      0 ml  Output   1551 ml  Net  -1551 ml    Exam:   General:  Pt is alert, follows commands appropriately, not in acute distress  Cardiovascular: Regular rate and rhythm, no rubs, no gallops  Respiratory: Clear to auscultation bilaterally, no wheezing, diminished breath sounds at bases   Abdomen: Soft, non tender, non distended, bowel sounds present, no guarding  Extremities: left thigh cellulitis with much less erythema, no TTP, no drainage, inner left thigh cellulitis also improving with  no erythema and no TTP,  pulses DP and PT palpable bilaterally  Neuro: Grossly nonfocal  Data Reviewed: Basic Metabolic Panel:  Recent Labs Lab 07/12/14 0410 07/13/14 0425 07/14/14 0339 07/15/14 0439 07/16/14 0500  NA 138 137 136* 140 138  K 5.0 5.2 4.6 4.6 4.6  CL 107 105 106 109 105  CO2 20 17* 18* 20 20  GLUCOSE 170* 99 105* 104* 137*  BUN 37* 34* 34* 33* 32*  CREATININE 1.42* 1.45* 1.54* 1.37* 1.29*  CALCIUM 8.4 8.5 8.4 8.7 8.7  MG 1.3*  --  1.6 1.6  --    Liver Function Tests:  Recent Labs Lab August 10, 2014 1959 07/12/14 0410  AST 24 13  ALT 16 11  ALKPHOS 135* 93  BILITOT 0.4 0.3  PROT 9.3* 6.8  ALBUMIN 2.9* 2.0*   CBC:  Recent Labs Lab 08-10-2014 1959 07/12/14 0410 07/13/14 0425 07/14/14 0339 07/15/14 0439 07/16/14 0500  WBC 12.9* 14.3* 14.6* 10.7* 7.8 8.0  NEUTROABS 12.0* 13.5*  --   --   --   --  HGB 11.4* 8.7* 9.6* 8.9* 8.5* 8.7*  HCT 35.9* 27.4* 29.0* 27.5* 26.1* 27.0*  MCV 88.0 88.7 86.1 86.5 86.1 87.1  PLT 187 159 139* 155 177 202   Cardiac Enzymes:  Recent Labs Lab 07/11/14 1959  TROPONINI <0.30   BNP: Invalid input(s): POCBNP CBG:  Recent Labs Lab 07/16/14 2002 07/17/14 0052 07/17/14 0429 07/17/14 0746 07/17/14 1214  GLUCAP 136* 150* 131* 145* 118*    Recent Results (from the past 240 hour(s))  Blood Culture (routine x 2)     Status: None (Preliminary result)   Collection Time: 07/11/14  7:58 PM  Result Value Ref Range Status   Specimen Description BLOOD R WRIST  Final   Special Requests BOTTLES DRAWN AEROBIC ONLY 3ML  Final   Culture  Setup Time   Final    07/12/2014 03:31 Performed at [facility]    Culture   Final           BLOOD CULTURE RECEIVED NO GROWTH TO DATE CULTURE WILL BE HELD FOR 5 DAYS BEFORE ISSUING A FINAL NEGATIVE REPORT Performed at [facility]    Report Status PENDING  Incomplete  Blood Culture (routine x 2)     Status: None   Collection Time: 07/11/14  7:59 PM  Result Value Ref  Range Status   Specimen Description BLOOD R HAND  Final   Special Requests BOTTLES DRAWN AEROBIC ONLY 3 ML  Final   Culture  Setup Time   Final    07/12/2014 03:31 Performed at [facility]    Culture   Final    STREPTOCOCCUS GROUP C Note: Gram Stain Report Called to,Read Back By and Verified With: RENATA TURNAGE ON 07/12/2014 AT 9:45P BY WILEJ Performed at [facility]    Report Status 07/13/2014 FINAL  Final  Urine culture     Status: None   Collection Time: 07/11/14  8:44 PM  Result Value Ref Range Status   Specimen Description URINE, CATHETERIZED  Final   Special Requests NONE  Final   Culture  Setup Time   Final    07/12/2014 08:17 Performed at [place] Performed at [facility]   Final   Culture NO GROWTH Performed at [facility]   Final   Report Status 07/13/2014 FINAL  Final  MRSA PCR Screening     Status: Abnormal   Collection Time: 07/12/14  2:10 AM  Result Value Ref Range Status   MRSA by PCR POSITIVE (A) NEGATIVE Final    Comment:        The GeneXpert MRSA Assay (FDA approved for NASAL specimens only), is one component of a comprehensive MRSA colonization surveillance program. It is not intended to diagnose MRSA infection nor to guide or monitor treatment for MRSA infections. RESULT CALLED TO, READ BACK BY AND VERIFIED WITH: H.JOHNSON,RN AT 0330 ON 07/12/14 BY SHEA.W      Scheduled Meds: . allopurinol  100 mg Oral Daily  . antiseptic oral rinse  7 mL Mouth Rinse BID  . apixaban  5 mg Oral BID  . atorvastatin  40 mg Oral Daily  . aztreonam  2 g Intravenous 3 times per day  . furosemide  40 mg Oral Daily  . gabapentin  300 mg Oral TID  . guaiFENesin  600 mg Oral BID  . insulin aspart  0-9 Units Subcutaneous TID WC  . isosorbide mononitrate  10 mg Oral BID  . metolazone  5 mg Oral Q24H  . metoprolol tartrate  75 mg Oral BID  . metronidazole  500 mg Intravenous Q8H  .  pantoprazole  40 mg Oral Daily  . polyethylene glycol  17 g Oral BID  . senna  1 tablet Oral BID  . sodium chloride  3 mL Intravenous Q12H  . vancomycin  1,500 mg Intravenous Q24H   Continuous Infusions:

## 2014-07-17 NOTE — Progress Notes (Signed)
Physical Therapy Treatment Patient Details Name: Kelly [name] MRN: [id number] DOB: 08-30-1945 Today's Date: 07/17/2014    History of Present Illness 24-yo female admitted  07/11/14   with fever and LLE pain/cellulitis.   PMHx:  chronic diastolic heart failure, hypertension, hyperlipidemia, diabetes mellitus type 2, obstructive sleep apnea, GERD, depression, chronic lymphedema, and indwelling Foley catheter. Recently had I/D  L thigh wound  in 6/15 with multiple complications  including [name]. Patient 's leg wound being managed by Wound center.    PT Comments    Pt progressing slowly, able to amb short distance today but with more pain  Follow Up Recommendations  Home health PT;Supervision for mobility/OOB     Equipment Recommendations  3in1 (PT);Wheelchair (measurements PT);Hospital bed (pt reports she needs bariatric DME)    Recommendations for Other Services       Precautions / Restrictions Precautions Precautions: Fall    Mobility  Bed Mobility Overal bed mobility: +2 for physical assistance;+ 2 for safety/equipment Bed Mobility: Supine to Sit           General bed mobility comments: +2 for trunk, LEs, to bring hips forward, bed pad used to assist pt in scooting  Transfers Overall transfer level: Needs assistance Equipment used: Rolling walker (2 wheeled) Transfers: Sit to/from Stand Sit to Stand: +2 physical assistance;Mod assist;Min assist;From elevated surface         General transfer comment: bed softened for pt feet to reach floor; +2 for anterior superior wt shift and control of descent; pt relies on momentum to stand; 2 trials of standing  Ambulation/Gait Ambulation/Gait assistance: +2 physical assistance;Min assist Ambulation Distance (Feet): 7 Feet Assistive device: Rolling walker (2 wheeled) Gait Pattern/deviations: Step-to pattern;Decreased step length - right;Decreased step length - left;Trunk flexed;Wide base of support   Gait velocity  interpretation: Below normal speed for age/gender General Gait Details: +2 for safety, chair follow; verbal cues for posture and RW position   Stairs            Wheelchair Mobility    Modified Rankin (Stroke Patients Only)       Balance Overall balance assessment: Needs assistance Sitting-balance support: Feet supported;No upper extremity supported Sitting balance-Leahy Scale: Good Sitting balance - Comments: pt has incr BOS in sitting due to body habitus;    Standing balance support: Bilateral upper extremity supported Standing balance-Leahy Scale: Poor                      Cognition Arousal/Alertness: Awake/alert Behavior During Therapy: WFL for tasks assessed/performed Overall Cognitive Status: Within Functional Limits for tasks assessed                      Exercises General Exercises - Lower Extremity Ankle Circles/Pumps: Both;10 reps Heel Slides: Strengthening;Both;15 reps    General Comments        Pertinent Vitals/Pain Pain Assessment: 0-10 Pain Score: 6  Pain Location: LLE Pain Descriptors / Indicators: Jabbing Pain Intervention(s): Limited activity within patient's tolerance;Monitored during session;Patient requesting pain meds-RN notified    Home Living                      Prior Function            PT Goals (current goals can now be found in the care plan section) Acute Rehab PT Goals Patient Stated Goal: pt wants to go home PT Goal Formulation: With patient Time For Goal Achievement: 07/23/14 Potential to  Achieve Goals: Fair Progress towards PT goals: Progressing toward goals    Frequency  Min 4X/week    PT Plan Current plan remains appropriate    Co-evaluation             End of Session Equipment Utilized During Treatment: Gait belt Activity Tolerance: Patient limited by fatigue;Patient limited by pain Patient left: in chair;with call bell/phone within reach     Time: 1230-1313 PT Time  Calculation (min) (ACUTE ONLY): 43 min  Charges:  $Gait Training: 8-22 mins $Therapeutic Activity: 23-37 mins                    G Codes:      Kelly [name] 07-21-14, 2:03 PM

## 2014-07-17 NOTE — Progress Notes (Signed)
ANTIBIOTIC CONSULT NOTE   Pharmacy Consult for Vancomycin, Aztreonam Indication: severe sepsis secondary to L thigh cellulitis / Group C strep bacteremia  Allergies  Allergen Reactions  . Bactrim [Sulfamethoxazole-Trimethoprim] Other (See Comments)    Hyperkalemia (July 2015)  . Penicillins Hives    Patient Measurements: Height: 5\' 6"  (167.6 cm) Weight: (!) 324 lb (146.965 kg) IBW/kg (Calculated) : 59.3  Vital Signs: Temp: 98.8 F (37.1 C) (11/21 0442) Temp Source: Oral (11/21 0442) BP: 160/51 mmHg (11/21 0442) Pulse Rate: 74 (11/21 0442) Intake/Output from previous day: 11/20 0701 - 11/21 0700 In: -  Out: 3007 [Urine:1550; Stool:1] Intake/Output from this shift:    Labs:  Recent Labs  07/15/14 0439 07/16/14 0500  WBC 7.8 8.0  HGB 8.5* 8.7*  PLT 177 202  CREATININE 1.37* 1.29*   Estimated Creatinine Clearance: 63.1 mL/min (by C-G formula based on Cr of 1.29).   Microbiology: 11/15 blood x2: 1 of 2 GroupC Strep, 1 of 2 ngtd 11/15 urine: NGF 11/16 MRSA PCR: positive 11/16 Legionella Ag, urine: negative 11/16 Strep pneumo Ag, urine: negative 11/16 Influenza panel PCR: negative    Assessment: 63 yof brought to ED with fever and weakness. 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 CXR showed questionable interstitial infection. Previous cultures from 9/13 - urine: Proteus miribalis (sensitive zosyn, bactrim only), Group C strep for which she received several days of IV vancomycin then PO levofloxacin.  Vancomycin, aztreonam, and levofloxacin were ordered with pharmacy dosing assistance requested.   Goal of Therapy:  Vancomycin trough level 15-20 mcg/ml  Antibiotic doses appropriate for indication/weight/renal function Eradication of infection  Antimicrobial Drugs: 11/15 >> Vancomycin >> 11/15 >> Aztreonam >> 11/15 >> Levofloxacin >>11/10 11/15 >> Tamiflu >> 11/17 11/19 >> Flagyl >>  Drug levels and dosage  changes: 11/18: Vancomycin trough 25.4 on 2 g q24h after a 2.5 g loading dose. (Question of whether patient may have inadvertently received an additional 1 g loading dose)  -- dosage was reduced to 1.5 g q24h  Today, 07/17/2014 : Day # 7 antibiotics D#7 vancomycin, now 1.5 grams IV q24h D#7 aztreonam 2 grams IV q8h D#3 metronidazole 500 mg IV q8h  Afebrile since 11/17 WBC WNL since 11/19 SCr improved on 11/20 (1.29), est CrCl 48 N, 63 CG  According to attending MD, Infectious Diseases service input is being sought.     Plan:  1.   Continue present vancomycin, aztreonam, (and metronidazole) dosages for now 2.   Check vancomycin trough and serum creatinine tomorrow AM. 3.   Await ID physician input. 4.   Follow renal function, culture results, clinical course.  Clayburn Pert, PharmD, BCPS Pager: [phone] 07/17/2014  10:48 AM

## 2014-07-18 LAB — GLUCOSE, CAPILLARY
GLUCOSE-CAPILLARY: 140 mg/dL — AB (ref 70–99)
Glucose-Capillary: 109 mg/dL — ABNORMAL HIGH (ref 70–99)
Glucose-Capillary: 117 mg/dL — ABNORMAL HIGH (ref 70–99)
Glucose-Capillary: 107 mg/dL — ABNORMAL HIGH (ref 70–99)
Glucose-Capillary: 188 mg/dL — ABNORMAL HIGH (ref 70–99)
Glucose-Capillary: 138 mg/dL — ABNORMAL HIGH (ref 70–99)

## 2014-07-18 LAB — CBC
HCT: 26.5 % — ABNORMAL LOW (ref 36.0–46.0)
Hemoglobin: 8.6 g/dL — ABNORMAL LOW (ref 12.0–15.0)
MCH: 28 pg (ref 26.0–34.0)
MCHC: 32.5 g/dL (ref 30.0–36.0)
MCV: 86.3 fL (ref 78.0–100.0)
PLATELETS: 204 10*3/uL (ref 150–400)
RBC: 3.07 MIL/uL — AB (ref 3.87–5.11)
RDW: 15.9 % — AB (ref 11.5–15.5)
WBC: 7.3 10*3/uL (ref 4.0–10.5)

## 2014-07-18 LAB — BASIC METABOLIC PANEL
ANION GAP: 11 (ref 5–15)
BUN: 32 mg/dL — ABNORMAL HIGH (ref 6–23)
CHLORIDE: 109 meq/L (ref 96–112)
CO2: 21 meq/L (ref 19–32)
Calcium: 8.8 mg/dL (ref 8.4–10.5)
Creatinine, Ser: 1.23 mg/dL — ABNORMAL HIGH (ref 0.50–1.10)
GFR calc Af Amer: 51 mL/min — ABNORMAL LOW (ref >90)
GFR calc non Af Amer: 44 mL/min — ABNORMAL LOW (ref >90)
Glucose, Bld: 116 mg/dL — ABNORMAL HIGH (ref 70–99)
POTASSIUM: 4.2 meq/L (ref 3.7–5.3)
SODIUM: 141 meq/L (ref 137–147)

## 2014-07-18 LAB — CULTURE, BLOOD (ROUTINE X 2): Culture: NO GROWTH

## 2014-07-18 LAB — VANCOMYCIN, TROUGH: Vancomycin Tr: 18.9 ug/mL (ref 10.0–20.0)

## 2014-07-18 LAB — CREATININE, SERUM
CREATININE: 1.18 mg/dL — AB (ref 0.50–1.10)
GFR calc Af Amer: 54 mL/min — ABNORMAL LOW (ref >90)
GFR calc non Af Amer: 47 mL/min — ABNORMAL LOW (ref >90)

## 2014-07-18 MED ORDER — FUROSEMIDE 10 MG/ML IJ SOLN
40.0000 mg | Freq: Once | INTRAMUSCULAR | Status: AC
  Administered 2014-07-18: 40 mg via INTRAVENOUS
  Filled 2014-07-18: qty 4

## 2014-07-18 MED ORDER — VANCOMYCIN HCL 10 G IV SOLR: 1500.0000 mg | INTRAVENOUS | Status: DC

## 2014-07-18 MED ORDER — ZOLPIDEM TARTRATE 5 MG PO TABS: 5.0000 mg | ORAL_TABLET | Freq: Every evening | ORAL | Status: DC | PRN

## 2014-07-18 MED ORDER — OXYCODONE HCL 10 MG PO TABS: 10.0000 mg | ORAL_TABLET | ORAL | Status: DC | PRN

## 2014-07-18 NOTE — Progress Notes (Signed)
Physical Therapy Treatment Patient Details Name: Kelly [name] MRN: [id number] DOB: [birth year] Today's Date: 07/18/2014    History of Present Illness 42-yo female admitted  07/11/14   with fever and LLE pain/cellulitis.   PMHx:  chronic diastolic heart failure, hypertension, hyperlipidemia, diabetes mellitus type 2, obstructive sleep apnea, GERD, depression, chronic lymphedema, and indwelling Foley catheter. Recently had I/D  L thigh wound  in 6/15 with multiple complications  including PE. Patient 's leg wound being managed by Wound center.    PT Comments    Pt with incr amb distance today, incr activity tolerance, much better; Pt motivated to work with PT and go home; pt reports her left foot was draining yesterday and LLE with incr edema today; encouraged pt to elevate feet later but she likes to sit with them on floor  Follow Up Recommendations  Home health PT;Supervision for mobility/OOB     Equipment Recommendations  3in1 (PT);Wheelchair (measurements PT);Hospital bed (pt reports needing bariatric equipment)    Recommendations for Other Services       Precautions / Restrictions Precautions Precautions: Fall Restrictions Weight Bearing Restrictions: No    Mobility  Bed Mobility Overal bed mobility: +2 for physical assistance;+ 2 for safety/equipment Bed Mobility: Rolling;Sidelying to Sit Rolling: +2 for physical assistance;Mod assist Sidelying to sit: +2 for physical assistance;Min assist       General bed mobility comments: +2 for safety, to bring LEs forward and off bed after rolling with use of rail; pt able to do more of work to bring trunk to sitting position today; requires incr time to complete task; bed flat--on autofirm for rolling and s/l to sit, softened after pt in sitting for feet to reach floor  Transfers Overall transfer level: Needs assistance Equipment used: Rolling walker (2 wheeled) Transfers: Sit to/from Stand Sit to Stand: Min assist;+2 physical  assistance;+2 safety/equipment         General transfer comment: bed softened for pt feet to reach floor; +2 for anterior superior wt shift and control of descent; pt relies on momentum to stand; able to stand on first attempt today  Ambulation/Gait Ambulation/Gait assistance: +2 physical assistance;Min assist;Min guard Ambulation Distance (Feet): 75 Feet Assistive device: Rolling walker (2 wheeled) Gait Pattern/deviations: Step-through pattern;Decreased stride length;Trunk flexed     General Gait Details: +2 for safety, chair follow; verbal cues for posture and RW position   Stairs            Wheelchair Mobility    Modified Rankin (Stroke Patients Only)       Balance                                    Cognition Arousal/Alertness: Awake/alert Behavior During Therapy: WFL for tasks assessed/performed Overall Cognitive Status: Within Functional Limits for tasks assessed                      Exercises      General Comments        Pertinent Vitals/Pain Pain Assessment: 0-10 Pain Score: 3  Pain Location: LLE Pain Descriptors / Indicators: Pressure Pain Intervention(s): Limited activity within patient's tolerance;Monitored during session;Repositioned    Home Living                      Prior Function            PT Goals (current goals can now  be found in the care plan section) Acute Rehab PT Goals Patient Stated Goal: pt wants to go home PT Goal Formulation: With patient Time For Goal Achievement: 07/23/14 Potential to Achieve Goals: Fair Progress towards PT goals: Progressing toward goals    Frequency  Min 4X/week    PT Plan Current plan remains appropriate    Co-evaluation             End of Session Equipment Utilized During Treatment: Gait belt Activity Tolerance: Patient tolerated treatment well Patient left: in chair;with call bell/phone within reach     Time: 1121-1151 PT Time Calculation (min)  (ACUTE ONLY): 30 min  Charges:  $Gait Training: 23-37 mins                    G Codes:      Kelly [name] 08-10-2014, 12:03 PM

## 2014-07-18 NOTE — Progress Notes (Signed)
ANTIBIOTIC CONSULT NOTE   Pharmacy Consult for Vancomycin, Aztreonam Indication: severe sepsis secondary to L thigh cellulitis / Group C strep bacteremia  Allergies  Allergen Reactions  . Bactrim [Sulfamethoxazole-Trimethoprim] Other (See Comments)    Hyperkalemia (July 2015)  . Penicillins Hives    Patient Measurements: Height: 5\' 6"  (167.6 cm) Weight: (!) 335 lb 1.6 oz (152 kg) IBW/kg (Calculated) : 59.3  Vital Signs: Temp: 98.4 F (36.9 C) (11/22 0421) Temp Source: Oral (11/22 0421) BP: 163/53 mmHg (11/22 0421) Pulse Rate: 69 (11/22 0421) Intake/Output from previous day: 11/21 0701 - 11/22 0700 In: 720 [P.O.:720] Out: 2800 [Urine:2800] Intake/Output from this shift:    Labs:  Recent Labs  07/16/14 0500 07/18/14 0516 07/18/14 0918  WBC 8.0 7.3  --   HGB 8.7* 8.6*  --   PLT 202 204  --   CREATININE 1.29* 1.23* 1.18*   Estimated Creatinine Clearance: 70.4 mL/min (by C-G formula based on Cr of 1.18).   Microbiology: 11/15 blood x2: 1 of 2 GroupC Strep, 1 of 2 ngtd 11/15 urine: NGF 11/16 MRSA PCR: positive 11/16 Legionella Ag, urine: negative 11/16 Strep pneumo Ag, urine: negative 11/16 Influenza panel PCR: negative    Assessment: 76 yof brought to ED with fever and weakness. 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 CXR showed questionable interstitial infection. Previous cultures from 9/13 - urine: Proteus miribalis (sensitive zosyn, bactrim only), Group C strep for which she received several days of IV vancomycin then PO levofloxacin.  Vancomycin, aztreonam, and levofloxacin were ordered with pharmacy dosing assistance requested.   Goal of Therapy:  Vancomycin trough level 15-20 mcg/ml  Antibiotic doses appropriate for indication/weight/renal function Eradication of infection  Antimicrobial Drugs: 11/15 >> Vancomycin >> 11/15 >> Aztreonam >> 11/21 11/15 >> Levofloxacin >>11/10 11/15 >> Tamiflu >>  11/17 11/19 >> Flagyl >> 11/21  Drug levels and dosage changes: 11/18: Vancomycin trough 25.4 on 2 g q24h after a 2.5 g loading dose. (Question of whether patient may have inadvertently received an additional 1 g loading dose)  -- dosage was reduced to 1.5 g q24h 11/22:  vanc trough 18.9  Today, 07/18/2014 : Day # 8 antibiotics  D#8 vancomycin, now 1.5 grams IV q24h  Aztreonam and metronidazole d/c'd 11/21  Afebrile since 11/17 WBC WNL since 11/19 SCr improved  (1.18), est CrCl 52 N,  70 CG   Plan:     Vanc trough= 18.9, Continue present vancomycin dose     Follow renal function, clinical course.  Dolly Rias RPh 07/18/2014, 10:35 AM Pager [phone]

## 2014-07-18 NOTE — Discharge Summary (Addendum)
Physician Discharge Summary  Kelly [name] QTM:[id number] DOB: 08-06-1946 DOA: 07/11/2014  PCP: Alvester Chou, NP  Admit date: 07/11/2014 Discharge date: 07/19/2014  Recommendations for Outpatient Follow-up:  1. Pt will need to follow up with PCP in 2-3 weeks post discharge ( patient is followed at home) 2. Please obtain BMP to evaluate electrolytes and kidney function 3. Please also check CBC to evaluate Hg and Hct levels 4. Patient will be discharged on oral Keflex 500 mg by mouth every 6 hours for next 5 days to complete a 2 weeks course of antibiotic. 5. Orders placed for [facility] PT, RN, Aid, 3:1, hospital bed  6. Please also note that A1C is 6.8, consider adding Glipizide in near future if indicated   Discharge Diagnoses:  left hip and thigh cellulitis (second episode since 04/2014, history of group C streptococcus infection)  Active Problems:   Essential hypertension   Morbid obesity   Chronic diastolic heart failure   Pulmonary embolism   Chronic kidney disease, stage 3   Hyperkalemia   DM type 2 causing CKD stage 3   Coronary atherosclerosis of native coronary artery   Sepsis   Cellulitis of left leg: Probable  Discharge Condition: Stable  Diet recommendation: Heart healthy diet discussed in details   Brief narrative: 68 y.o. 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 Pt denied chest pain or shortness of breath on admission, no specific abd or urinary concerns. She reports lining at home, ambulates with walker.   In ER she was noted to be transiently hypotensive with SBP 98/32, lactic acid 2.5, T 101.7 F, leukocytosis 12.9, K 6.1. Patient was admitted to SDU for sepsis secondary to L thigh cellulitis. Hospital course complicated with streptococcus bacteremia (1 of the blood cultures is growing Strep C).  Assessment and Plan:    Principal Problem:  Severe sepsis secondary to left thigh cellulitis / streptococcus bacteremia / leukocytosis  - sepsis criteria met with T 101.7 F, elevated lactic acid, tachycardia with HR 110 - 120's, WBC 12.9, hypotension. In addition, patient has evidence of infection, source left thigh cellulitis. Please note blood culture from 07/11/2014 (1 of the blood cultures) is growing streptococcus C. Repeat blood cultures 11/20 still with no growth to date - patient remains afebrile in past 72 hours. WBC count normalized.  - pt initially started on Vancomycin, Aztreonam, Levaquin 07/11/2014  - Patient has received 9 days of IV vancomycin already. Given stage IIIc daily will not place a PICC line and discharge her on oral Keflex for 5 more days to complete a two-week course of antibiotic. stop date for ABX is 07/24/2014  ( discussed with Dr Linus Salmons on 11/23) - appreciate Plastic Surgery team assistance    Active problems:  Left thigh cellulitis - cellulitis seems to be improving, less TTP and less warmth to touch, erythema almost resolved . Has a small mucus  discharge from her left thigh .  Antibiotic course as above - unable to do MRI due to weight limits - [place] with no identifiable pocket of fluid  - appreciate plastic surgery team assistance   Chronic diastolic CHF - last known EF 60% per 2 D ECHO in 2014. 2 D ECHO on this admission showed normal systolic function but study not adequate to evaluate diastolic  function  - weight on last discharge recorded was 318 lbs and now up to 339 lb - ? Accuracy of the weight checks due to morbid obesity  - IV fluids stopped 07/12/2014 - avoid use of ACEI due to hyperkalemia and renal insufficiency  - continue home dose lasix 40 mg daily upon discharge    Pulmonary embolism and DVT - diagnosed on June 2015 - on anticoagulation with apixaban   Chronic kidney disease, stage 3 / metabolic acidosis  - Cr remains stable with overall downward trend since admission - stopped IVF 11/16 due to concern of volume overload    Hyperkalemia - resolved and remains WNL - would avoid ACEI due to propensity for hyperkalemia    Hypomagnesemia - supplemented, WNL   DM type 2 causing CKD stage 3, neuropathies  - A1C 6.7,  - diet regimen upon discharge with close follow up with PCP    HTN - continue Lasix, cont Metoprolol  - continue Imdur per home medical regimen    Morbid obesity, BMI > 40 - Counseled on diet   Anemia of chronic disease, CKD - no signs of active bleeding - hemoglobin is stable ~8 - no current indications for transfusion     Code Status: Full Family Communication: Pt and family at bedside Disposition Plan: Discharge home with [facility] PT, RN, Aid  IV Access:    Peripheral IV Procedures and diagnostic studies:    Dg Chest Port 1 View 08-05-14 Slightly increasing peribronchial/interstitial thickening -question interstitial infection. Medical Consultants:    Surgery, plastic - Dr.Sanger  ID - phone call only Other Consultants:    Physical therapy  Anti-Infectives:    Levaquin Aug 06, 2023 --> 11/19  Aztreonam 06-Aug-2023 --> 11/21  Vancomycin 06-Aug-2023 --> 11/23  Flagyl 11/19 --> 11/21  Keflex 11/23-11/28    Discharge Exam: Filed Vitals:   07/18/14 1500  BP: 163/59  Pulse: 63  Temp: 97.5 F (36.4 C)  Resp: 18   Filed Vitals:   07/17/14 1609 07/17/14 1954 07/18/14 0421 07/18/14 1500  BP:  167/58  163/53 163/59  Pulse:  65 69 63  Temp:  98.4 F (36.9 C) 98.4 F (36.9 C) 97.5 F (36.4 C)  TempSrc:  Oral Oral Oral  Resp:  _0 Height:      Weight: 152 kg (335 lb 1.6 oz)     SpO2:  100% 97% 97%    General: Pt is alert, follows commands appropriately, not in acute distress Cardiovascular: Regular rate and rhythm, S1/S2 +, no murmurs, no rubs, no gallops Respiratory: Clear to auscultation bilaterally, no wheezing, diminished breath sounds at bases  Abdominal: Soft, non tender, non distended, bowel sounds +, no guarding Extremities: +2 bilateral LE pitting edema with bilateral chronic venous stasis changes, dry skin  Skin: left inner thigh cellulitis with left hip area cellulitis, now with almost resolve erythema and no TTP, still areas of skin hardening but overall much better , clean left medial thigh ulcer with scanty mucus discharge Neuro: Grossly nonfocal  Discharge Instructions     Medication List    STOP taking these medications  atorvastatin 40 MG tablet  Commonly known as:  LIPITOR      TAKE these medications        acetaminophen 325 MG tablet  Commonly known as:  TYLENOL  Take 650 mg by mouth every 6 (six) hours as needed for fever.     allopurinol 100 MG tablet  Commonly known as:  ZYLOPRIM  Take 100 mg by mouth daily.     apixaban 5 MG Tabs tablet  Commonly known as:  ELIQUIS  Take 1 tablet (5 mg total) by mouth 2 (two) times daily.     docusate sodium 100 MG capsule  Commonly known as:  COLACE  Take 100 mg by mouth daily as needed.     ergocalciferol 50000 UNITS capsule  Commonly known as:  VITAMIN D2  Take 50,000 Units by mouth once a week. Every thursday     esomeprazole 40 MG capsule  Commonly known as:  NEXIUM  Take 40 mg by mouth daily before breakfast. Take on an empty stomach     furosemide 40 MG tablet  Commonly known as:  LASIX  Take 40 mg by mouth daily.     gabapentin 300 MG capsule  Commonly known as:  NEURONTIN   Take 300 mg by mouth 3 (three) times daily.     isosorbide mononitrate 10 MG tablet  Commonly known as:  ISMO,MONOKET  Take 10 mg by mouth 2 (two) times daily.     JUVEN NUTRIVIGOR Pack  Take 1 Container by mouth 2 (two) times daily.     metoprolol tartrate 25 MG tablet  Commonly known as:  LOPRESSOR  Take 3 tablets (75 mg total) by mouth 2 (two) times daily.     Oxycodone HCl 10 MG Tabs  Take 1 tablet (10 mg total) by mouth every 4 (four) hours as needed (moderate pain).     polyethylene glycol packet  Commonly known as:  MIRALAX / GLYCOLAX  Take 17 g by mouth daily. Mix with 4-6 oz liquid     promethazine 25 MG tablet  Commonly known as:  PHENERGAN  Take 25 mg by mouth every 6 (six) hours as needed for nausea or vomiting.     senna 8.6 MG Tabs tablet  Commonly known as:  SENOKOT  Take 1 tablet (8.6 mg total) by mouth 2 (two) times daily.     Keflex 500 mg po 4 times a da y until 07/24/2014       zolpidem 5 MG tablet  Commonly known as:  AMBIEN  Take 1 tablet (5 mg total) by mouth at bedtime as needed for sleep.           Follow-up Information    Follow up with Alvester Chou, NP.   Specialty:  Nurse Practitioner   Contact information:   [facility] [place] [address] [phone]       Follow up with Faye Ramsay, MD.   Specialty:  Internal Medicine   Why:  As needed, If symptoms worsen   Contact information:   [address] [place] [address] [phone]        The results of significant diagnostics from this hospitalization (including imaging, microbiology, ancillary and laboratory) are listed below for reference.     Microbiology: Recent Results (from the past 240 hour(s))  Blood Culture (routine x 2)     Status: None   Collection Time: 07/11/14  7:58 PM  Result Value  Ref Range Status   Specimen Description BLOOD R WRIST  Final   Special Requests BOTTLES DRAWN AEROBIC  ONLY 3ML  Final   Culture  Setup Time   Final    07/12/2014 03:31 Performed at [facility]    Culture   Final    NO GROWTH 5 DAYS Performed at [facility]    Report Status 07/18/2014 FINAL  Final  Blood Culture (routine x 2)     Status: None   Collection Time: 07/11/14  7:59 PM  Result Value Ref Range Status   Specimen Description BLOOD R HAND  Final   Special Requests BOTTLES DRAWN AEROBIC ONLY 3 ML  Final   Culture  Setup Time   Final    07/12/2014 03:31 Performed at [facility]    Culture   Final    STREPTOCOCCUS GROUP C Note: Gram Stain Report Called to,Read Back By and Verified With: RENATA TURNAGE ON 07/12/2014 AT 9:45P BY WILEJ Performed at [facility]    Report Status 07/13/2014 FINAL  Final  Urine culture     Status: None   Collection Time: 07/11/14  8:44 PM  Result Value Ref Range Status   Specimen Description URINE, CATHETERIZED  Final   Special Requests NONE  Final   Culture  Setup Time   Final    07/12/2014 08:17 Performed at [place] Performed at [facility]   Final   Culture NO GROWTH Performed at [facility]   Final   Report Status 07/13/2014 FINAL  Final  MRSA PCR Screening     Status: Abnormal   Collection Time: 07/12/14  2:10 AM  Result Value Ref Range Status   MRSA by PCR POSITIVE (A) NEGATIVE Final    Comment:        The GeneXpert MRSA Assay (FDA approved for NASAL specimens only), is one component of a comprehensive MRSA colonization surveillance program. It is not intended to diagnose MRSA infection nor to guide or monitor treatment for MRSA infections. RESULT CALLED TO, READ BACK BY AND VERIFIED WITH: H.JOHNSON,RN AT 0330 ON 07/12/14 BY SHEA.W   Culture, blood (routine x 2)     Status: None (Preliminary result)   Collection Time: 07/16/14 10:30 AM  Result Value Ref Range Status   Specimen Description BLOOD LEFT HAND  Final   Special Requests  BOTTLES DRAWN AEROBIC AND ANAEROBIC [facility]  Final   Culture  Setup Time   Final    07/16/2014 12:10 Performed at [facility]    Culture   Final           BLOOD CULTURE RECEIVED NO GROWTH TO DATE CULTURE WILL BE HELD FOR 5 DAYS BEFORE ISSUING A FINAL NEGATIVE REPORT Performed at [facility]    Report Status PENDING  Incomplete  Culture, blood (routine x 2)     Status: None (Preliminary result)   Collection Time: 07/16/14 10:38 AM  Result Value Ref Range Status   Specimen Description BLOOD RIGHT ARM  Final   Special Requests BOTTLES DRAWN AEROBIC AND ANAEROBIC 10CC  Final   Culture  Setup Time   Final    07/16/2014 12:10 Performed at [facility]    Culture   Final           BLOOD CULTURE RECEIVED NO GROWTH TO DATE CULTURE WILL BE HELD FOR 5 DAYS BEFORE ISSUING A FINAL NEGATIVE REPORT Performed at [facility]    Report Status PENDING  Incomplete     Labs: Basic Metabolic Panel:  Recent Labs Lab 07/12/14 0410 07/13/14 0425 07/14/14 0339 07/15/14 0439 07/16/14 0500 07/18/14 0516 07/18/14 0918  NA 138 137 136* 140 138 141  --   K 5.0 5.2 4.6 4.6 4.6 4.2  --   CL 107 105 106 109 105 109  --   CO2 20 17* 18* _0 --   GLUCOSE 170* 99 105* 104* 137* 116*  --   BUN 37* 34* 34* 33* 32* 32*  --   CREATININE 1.42* 1.45* 1.54* 1.37* 1.29* 1.23* 1.18*  CALCIUM 8.4 8.5 8.4 8.7 8.7 8.8  --   MG 1.3*  --  1.6 1.6  --   --   --    Liver Function Tests:  Recent Labs Lab 07/11/14 1959 07/12/14 0410  AST 24 13  ALT 16 11  ALKPHOS 135* 93  BILITOT 0.4 0.3  PROT 9.3* 6.8  ALBUMIN 2.9* 2.0*   CBC:  Recent Labs Lab 07/11/14 1959 07/12/14 0410 07/13/14 0425 07/14/14 0339 07/15/14 0439 07/16/14 0500 07/18/14 0516  WBC 12.9* 14.3* 14.6* 10.7* 7.8 8.0 7.3  NEUTROABS 12.0* 13.5*  --   --   --   --   --   HGB 11.4* 8.7* 9.6* 8.9* 8.5* 8.7* 8.6*  HCT 35.9* 27.4* 29.0* 27.5* 26.1* 27.0* 26.5*  MCV 88.0 88.7 86.1 86.5 86.1 87.1 86.3  PLT  187 159 139* 155 177 202 204   Cardiac Enzymes:  Recent Labs Lab 07/11/14 1959  TROPONINI <0.30   BNP: BNP (last 3 results)  Recent Labs  07/11/14 1959  PROBNP 2482.0*   CBG:  Recent Labs Lab 07/17/14 1947 07/18/14 0038 07/18/14 0406 07/18/14 0727 07/18/14 1227  GLUCAP 155* 109* 117* 107* 140*   SIGNED: Time coordinating discharge: Over 30 minutes  Faye Ramsay, MD  [facility] 07/18/2014, 3:53 PM Pager [phone]  If 7PM-7AM, please contact night-coverage [email or web address] Password [email or web address]

## 2014-07-18 NOTE — Discharge Instructions (Signed)

## 2014-07-19 LAB — GLUCOSE, CAPILLARY
Glucose-Capillary: 131 mg/dL — ABNORMAL HIGH (ref 70–99)
Glucose-Capillary: 135 mg/dL — ABNORMAL HIGH (ref 70–99)
Glucose-Capillary: 102 mg/dL — ABNORMAL HIGH (ref 70–99)
Glucose-Capillary: 139 mg/dL — ABNORMAL HIGH (ref 70–99)

## 2014-07-19 LAB — CBC
HCT: 26.9 % — ABNORMAL LOW (ref 36.0–46.0)
Hemoglobin: 8.6 g/dL — ABNORMAL LOW (ref 12.0–15.0)
MCH: 28.1 pg (ref 26.0–34.0)
MCHC: 32 g/dL (ref 30.0–36.0)
MCV: 87.9 fL (ref 78.0–100.0)
PLATELETS: 204 10*3/uL (ref 150–400)
RBC: 3.06 MIL/uL — ABNORMAL LOW (ref 3.87–5.11)
RDW: 16 % — AB (ref 11.5–15.5)
WBC: 7.1 10*3/uL (ref 4.0–10.5)

## 2014-07-19 LAB — BASIC METABOLIC PANEL
ANION GAP: 11 (ref 5–15)
BUN: 30 mg/dL — ABNORMAL HIGH (ref 6–23)
CHLORIDE: 108 meq/L (ref 96–112)
CO2: 21 mEq/L (ref 19–32)
Calcium: 8.6 mg/dL (ref 8.4–10.5)
Creatinine, Ser: 1.24 mg/dL — ABNORMAL HIGH (ref 0.50–1.10)
GFR, EST AFRICAN AMERICAN: 51 mL/min — AB (ref >90)
GFR, EST NON AFRICAN AMERICAN: 44 mL/min — AB (ref >90)
Glucose, Bld: 127 mg/dL — ABNORMAL HIGH (ref 70–99)
POTASSIUM: 4 meq/L (ref 3.7–5.3)
Sodium: 140 mEq/L (ref 137–147)

## 2014-07-19 MED ORDER — CEPHALEXIN 500 MG PO CAPS
500.0000 mg | ORAL_CAPSULE | Freq: Four times a day (QID) | ORAL | Status: DC
Start: 2014-07-19 — End: 2014-09-07

## 2014-07-19 NOTE — Plan of Care (Signed)
Problem: Phase III Progression Outcomes Goal: IV/normal saline lock discontinued Outcome: Completed/Met Date Met:  07/19/14

## 2014-07-19 NOTE — Plan of Care (Signed)
Problem: Phase III Progression Outcomes Goal: Activity at appropriate level-compared to baseline (UP IN CHAIR FOR HEMODIALYSIS)  Outcome: Completed/Met Date Met:  07/19/14

## 2014-07-19 NOTE — Plan of Care (Signed)
Problem: Phase III Progression Outcomes Goal: Discharge plan remains appropriate-arrangements made Outcome: Completed/Met Date Met:  07/19/14

## 2014-07-19 NOTE — Plan of Care (Signed)
Problem: Phase III Progression Outcomes Goal: Other Phase III Outcomes/Goals Outcome: Not Applicable Date Met:  54/30/14

## 2014-07-19 NOTE — Progress Notes (Signed)
CARE MANAGEMENT NOTE 07/19/2014  Patient:  Kelly [name], Kelly [name]   Account Number:  [id number]  Date Initiated:  07/13/2014  Documentation initiated by:  DAVIS,RHONDA  Subjective/Objective Assessment:   sepsis recurrent entercoccus     Action/Plan:   home may require snf for sepsis recurrent and to ensure good skin ands wound care/patient was   Anticipated DC Date:  07/16/2014   Anticipated DC Plan:  [place] referral  Clinical Social Worker      DC Planning Services  CM consult      [facility] Choice  NA   Choice offered to / List presented to:  C-1 Patient   DME arranged  3-N-1  [place]      DME agency  [place] arranged  HH-1 RN  [place]      [place] agency  [place]   Status of service:  Completed, signed off Medicare Important Message given?  YES (If response is "NO", the following Medicare IM given date fields will be blank) Date Medicare IM given:  07/15/2014 Medicare IM given by:  Edwyna Shell Date Additional Medicare IM given:  07/19/2014 Additional Medicare IM given by:  Edwyna Shell  Discharge Disposition:  [place]  Per UR Regulation:  Reviewed for med. necessity/level of care/duration of stay  If discussed at [place] of Stay Meetings, dates discussed:    Comments:  07/19/14 Edwyna Shell RN, BSN, [facility] services arranged per patient request and DME: w/c and 3N1 will be delivered to patient's home per patient request.  07/16/14 Edwyna Shell RN, BSN, CM Spoke with [facility] DME rep and she stated that based on weight the patient is not eligible for a bariatric bed.  07/15/14 Edwyna Shell RN, BSN, CM Patient states that she lives at home with her nephew, she has a rolling walker, shower chair, and hospital bed, her PCP makes home visits and her medications are delivered. The patient stated that she was  discharged home from [facility] for rehab on 10/18 and was followed in the home by [place] and PT and if [facility] services are appropriate after discharge she would like to remain with [facility].  [id number]/EHMCNO Rosana Hoes, RN, BSN, CCM Chart reviewed. Discharge needs and patient's stay to be reviewed and followed by case manager. Chart note for progression of stay: 68 y.o. 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

## 2014-07-19 NOTE — Progress Notes (Signed)
Discharge instructions and medications reviewed with patient. Patient verbalizes understanding and has no questions at this time. Patient confirms she has all personal belongings in her possession. Patient discharged home. Transported home via private vehicle provided by family.

## 2014-07-19 NOTE — Progress Notes (Signed)
[facility] PROGRESS NOTE  Kelly [name] MBW:[id number] DOB: 04/06/1946 DOA: 07/11/2014 PCP: Alvester Chou, NP  Assessment/Plan: Severe sepsis secondary to left thigh cellulitis Resolved. Treated with empiric antibiotics. Blood Cultures negative. Plan to discharge him on oral Keflex to complete 2 weeks course of antibiotic. Has received 9 days of IV vancomycin.(Discussed divided course with Dr. Linus Salmons, initially planned for discharge on IV vancomycin but given her stage III CK D avoided putting in a PICC line)  Chronic diastolic CHF Does not appear to be volume overloaded. Off IV fluids. Held ACE inhibitor due to hyperkalemia and renal insufficiency. Continue home dose Lasix  History of PE On Eliquis  CKD stage III Renal function stable  Code Status: Full code Family Communication: Discussed with nephew Disposition Plan: Home with home health   Consultants:  plastic surgery  Procedures:  none     HPI/Subjective: Patient seen and examined. Reports some discharge from left thigh Denies any specific symptoms  Objective: Filed Vitals:   07/19/14 1005  BP: 171/74  Pulse: 66  Temp: 98.1 F (36.7 C)  Resp: 18    Intake/Output Summary (Last 24 hours) at 07/19/14 1133 Last data filed at 07/19/14 1005  Gross per 24 hour  Intake    720 ml  Output   3451 ml  Net  -2731 ml   Filed Weights   07/17/14 1609 07/18/14 1500 07/19/14 0438  Weight: 152 kg (335 lb 1.6 oz) 152 kg (335 lb 1.6 oz) 154 kg (339 lb 8.1 oz)    Exam:   General:  Elderly obese female in no distress  HEENT: Moist oral mucosa  Chest: Clear to auscultation bilaterally  CVS: Normal S1 and S2, no murmurs  Abdomen: Soft, nondistended, nontender, bowel sounds present  Extremities: No erythema or swelling of left lower extremity, scanty discharge from left medial thigh ulcer, 1+ pitting edema b/l, chr Foley  CNS: Alert and oriented  Data Reviewed: Basic Metabolic Panel:  Recent Labs Lab  07/14/14 0339 07/15/14 0439 07/16/14 0500 07/18/14 0516 07/18/14 0918 07/19/14 0530  NA 136* 140 138 141  --  140  K 4.6 4.6 4.6 4.2  --  4.0  CL 106 109 105 109  --  108  CO2 18* 20 20 21   --  21  GLUCOSE 105* 104* 137* 116*  --  127*  BUN 34* 33* 32* 32*  --  30*  CREATININE 1.54* 1.37* 1.29* 1.23* 1.18* 1.24*  CALCIUM 8.4 8.7 8.7 8.8  --  8.6  MG 1.6 1.6  --   --   --   --    Liver Function Tests: No results for input(s): AST, ALT, ALKPHOS, BILITOT, PROT, ALBUMIN in the last 168 hours. No results for input(s): LIPASE, AMYLASE in the last 168 hours. No results for input(s): AMMONIA in the last 168 hours. CBC:  Recent Labs Lab 07/14/14 0339 07/15/14 0439 07/16/14 0500 07/18/14 0516 07/19/14 0530  WBC 10.7* 7.8 8.0 7.3 7.1  HGB 8.9* 8.5* 8.7* 8.6* 8.6*  HCT 27.5* 26.1* 27.0* 26.5* 26.9*  MCV 86.5 86.1 87.1 86.3 87.9  PLT 155 177 202 204 204   Cardiac Enzymes: No results for input(s): CKTOTAL, CKMB, CKMBINDEX, TROPONINI in the last 168 hours. BNP (last 3 results)  Recent Labs  07/11/14 1959  PROBNP 2482.0*   CBG:  Recent Labs Lab 07/18/14 1637 07/18/14 2008 07/19/14 0019 07/19/14 0426 07/19/14 0732  GLUCAP 188* 138* 131* 135* 102*    Recent Results (from the past 240  hour(s))  Blood Culture (routine x 2)     Status: None   Collection Time: 07/11/14  7:58 PM  Result Value Ref Range Status   Specimen Description BLOOD R WRIST  Final   Special Requests BOTTLES DRAWN AEROBIC ONLY 3ML  Final   Culture  Setup Time   Final    07/12/2014 03:31 Performed at [facility]    Culture   Final    NO GROWTH 5 DAYS Performed at [facility]    Report Status 07/18/2014 FINAL  Final  Blood Culture (routine x 2)     Status: None   Collection Time: 07/11/14  7:59 PM  Result Value Ref Range Status   Specimen Description BLOOD R HAND  Final   Special Requests BOTTLES DRAWN AEROBIC ONLY 3 ML  Final   Culture  Setup Time   Final    07/12/2014  03:31 Performed at [facility]    Culture   Final    STREPTOCOCCUS GROUP C Note: Gram Stain Report Called to,Read Back By and Verified With: RENATA TURNAGE ON 07/12/2014 AT 9:45P BY WILEJ Performed at [facility]    Report Status 07/13/2014 FINAL  Final  Urine culture     Status: None   Collection Time: 07/11/14  8:44 PM  Result Value Ref Range Status   Specimen Description URINE, CATHETERIZED  Final   Special Requests NONE  Final   Culture  Setup Time   Final    07/12/2014 08:17 Performed at [place] Performed at [facility]   Final   Culture NO GROWTH Performed at [facility]   Final   Report Status 07/13/2014 FINAL  Final  MRSA PCR Screening     Status: Abnormal   Collection Time: 07/12/14  2:10 AM  Result Value Ref Range Status   MRSA by PCR POSITIVE (A) NEGATIVE Final    Comment:        The GeneXpert MRSA Assay (FDA approved for NASAL specimens only), is one component of a comprehensive MRSA colonization surveillance program. It is not intended to diagnose MRSA infection nor to guide or monitor treatment for MRSA infections. RESULT CALLED TO, READ BACK BY AND VERIFIED WITH: H.JOHNSON,RN AT 0330 ON 07/12/14 BY [email or web address]   Culture, blood (routine x 2)     Status: None (Preliminary result)   Collection Time: 07/16/14 10:30 AM  Result Value Ref Range Status   Specimen Description BLOOD LEFT HAND  Final   Special Requests BOTTLES DRAWN AEROBIC AND ANAEROBIC [facility]  Final   Culture  Setup Time   Final    07/16/2014 12:10 Performed at [facility]    Culture   Final           BLOOD CULTURE RECEIVED NO GROWTH TO DATE CULTURE WILL BE HELD FOR 5 DAYS BEFORE ISSUING A FINAL NEGATIVE REPORT Performed at [facility]    Report Status PENDING  Incomplete  Culture, blood (routine x 2)     Status: None (Preliminary result)   Collection Time: 07/16/14 10:38 AM  Result Value Ref Range  Status   Specimen Description BLOOD RIGHT ARM  Final   Special Requests BOTTLES DRAWN AEROBIC AND ANAEROBIC 10CC  Final   Culture  Setup Time   Final    07/16/2014 12:10 Performed at [facility]    Culture   Final  BLOOD CULTURE RECEIVED NO GROWTH TO DATE CULTURE WILL BE HELD FOR 5 DAYS BEFORE ISSUING A FINAL NEGATIVE REPORT Performed at [facility]    Report Status PENDING  Incomplete     Studies: No results found.  Scheduled Meds: . allopurinol  100 mg Oral Daily  . antiseptic oral rinse  7 mL Mouth Rinse BID  . apixaban  5 mg Oral BID  . atorvastatin  40 mg Oral Daily  . furosemide  40 mg Oral Daily  . gabapentin  300 mg Oral TID  . guaiFENesin  600 mg Oral BID  . insulin aspart  0-9 Units Subcutaneous TID WC  . isosorbide mononitrate  10 mg Oral BID  . metolazone  5 mg Oral Q24H  . metoprolol tartrate  75 mg Oral BID  . pantoprazole  40 mg Oral Daily  . sodium chloride  3 mL Intravenous Q12H  . vancomycin  1,500 mg Intravenous Q24H   Continuous Infusions:      Time spent: 25 minutes    Kelly [name], [place]  [facility] Pager [phone] If 7PM-7AM, please contact night-coverage at [email or web address], password [facility] 07/19/2014, 11:33 AM  LOS: 8 days

## 2014-07-19 NOTE — Plan of Care (Signed)
Problem: Phase III Progression Outcomes Goal: Foley discontinued Outcome: Completed/Met Date Met:  07/19/14

## 2014-07-19 NOTE — Plan of Care (Signed)
Problem: Phase III Progression Outcomes Goal: Pain controlled on oral analgesia Outcome: Completed/Met Date Met:  07/19/14     

## 2014-07-19 NOTE — Progress Notes (Signed)
[facility] is providing the following services: [facility] (patient declined hospital bed and wheelchair)  Has both items at home already.  If patient discharges after hours, please call [phone].   Linward Headland 07/19/2014, 12:35 PM

## 2014-07-19 NOTE — Plan of Care (Signed)
Problem: Phase II Progression Outcomes Goal: IV changed to normal saline lock Outcome: Completed/Met Date Met:  07/19/14     

## 2014-07-22 LAB — CULTURE, BLOOD (ROUTINE X 2)
CULTURE: NO GROWTH
CULTURE: NO GROWTH

## 2014-09-02 ENCOUNTER — Inpatient Hospital Stay (HOSPITAL_COMMUNITY)
Admission: EM | Admit: 2014-09-02 | Discharge: 2014-09-09 | DRG: 872 | Disposition: A | Discharge status: Home Health | Payer: Medicare Other | Attending: Internal Medicine | Admitting: Internal Medicine

## 2014-09-02 ENCOUNTER — Encounter (HOSPITAL_COMMUNITY): Payer: Self-pay | Admitting: Emergency Medicine

## 2014-09-02 ENCOUNTER — Inpatient Hospital Stay (HOSPITAL_COMMUNITY): Payer: Medicare Other

## 2014-09-02 DIAGNOSIS — Z882 Allergy status to sulfonamides status: Secondary | ICD-10-CM | POA: Diagnosis not present

## 2014-09-02 DIAGNOSIS — M1990 Unspecified osteoarthritis, unspecified site: Secondary | ICD-10-CM | POA: Diagnosis present

## 2014-09-02 DIAGNOSIS — Z794 Long term (current) use of insulin: Secondary | ICD-10-CM

## 2014-09-02 DIAGNOSIS — Z79899 Other long term (current) drug therapy: Secondary | ICD-10-CM

## 2014-09-02 DIAGNOSIS — B955 Unspecified streptococcus as the cause of diseases classified elsewhere: Secondary | ICD-10-CM | POA: Diagnosis present

## 2014-09-02 DIAGNOSIS — I2510 Atherosclerotic heart disease of native coronary artery without angina pectoris: Secondary | ICD-10-CM | POA: Diagnosis present

## 2014-09-02 DIAGNOSIS — Z7901 Long term (current) use of anticoagulants: Secondary | ICD-10-CM

## 2014-09-02 DIAGNOSIS — I82409 Acute embolism and thrombosis of unspecified deep veins of unspecified lower extremity: Secondary | ICD-10-CM | POA: Diagnosis present

## 2014-09-02 DIAGNOSIS — Z833 Family history of diabetes mellitus: Secondary | ICD-10-CM

## 2014-09-02 DIAGNOSIS — L03119 Cellulitis of unspecified part of limb: Secondary | ICD-10-CM | POA: Diagnosis present

## 2014-09-02 DIAGNOSIS — B954 Other streptococcus as the cause of diseases classified elsewhere: Secondary | ICD-10-CM | POA: Diagnosis present

## 2014-09-02 DIAGNOSIS — K219 Gastro-esophageal reflux disease without esophagitis: Secondary | ICD-10-CM | POA: Diagnosis present

## 2014-09-02 DIAGNOSIS — M109 Gout, unspecified: Secondary | ICD-10-CM | POA: Diagnosis present

## 2014-09-02 DIAGNOSIS — L03115 Cellulitis of right lower limb: Secondary | ICD-10-CM

## 2014-09-02 DIAGNOSIS — R7881 Bacteremia: Secondary | ICD-10-CM

## 2014-09-02 DIAGNOSIS — E1122 Type 2 diabetes mellitus with diabetic chronic kidney disease: Secondary | ICD-10-CM | POA: Diagnosis present

## 2014-09-02 DIAGNOSIS — R011 Cardiac murmur, unspecified: Secondary | ICD-10-CM | POA: Diagnosis present

## 2014-09-02 DIAGNOSIS — Z86711 Personal history of pulmonary embolism: Secondary | ICD-10-CM | POA: Diagnosis not present

## 2014-09-02 DIAGNOSIS — L03116 Cellulitis of left lower limb: Secondary | ICD-10-CM | POA: Diagnosis present

## 2014-09-02 DIAGNOSIS — I5032 Chronic diastolic (congestive) heart failure: Secondary | ICD-10-CM | POA: Diagnosis present

## 2014-09-02 DIAGNOSIS — D649 Anemia, unspecified: Secondary | ICD-10-CM | POA: Diagnosis present

## 2014-09-02 DIAGNOSIS — E1149 Type 2 diabetes mellitus with other diabetic neurological complication: Secondary | ICD-10-CM | POA: Diagnosis present

## 2014-09-02 DIAGNOSIS — S71109A Unspecified open wound, unspecified thigh, initial encounter: Secondary | ICD-10-CM | POA: Diagnosis present

## 2014-09-02 DIAGNOSIS — I129 Hypertensive chronic kidney disease with stage 1 through stage 4 chronic kidney disease, or unspecified chronic kidney disease: Secondary | ICD-10-CM | POA: Diagnosis present

## 2014-09-02 DIAGNOSIS — N183 Chronic kidney disease, stage 3 unspecified: Secondary | ICD-10-CM | POA: Diagnosis present

## 2014-09-02 DIAGNOSIS — N179 Acute kidney failure, unspecified: Secondary | ICD-10-CM | POA: Diagnosis present

## 2014-09-02 DIAGNOSIS — Z8249 Family history of ischemic heart disease and other diseases of the circulatory system: Secondary | ICD-10-CM

## 2014-09-02 DIAGNOSIS — Z6841 Body Mass Index (BMI) 40.0 and over, adult: Secondary | ICD-10-CM | POA: Diagnosis not present

## 2014-09-02 DIAGNOSIS — R197 Diarrhea, unspecified: Secondary | ICD-10-CM | POA: Diagnosis present

## 2014-09-02 DIAGNOSIS — E1140 Type 2 diabetes mellitus with diabetic neuropathy, unspecified: Secondary | ICD-10-CM | POA: Diagnosis present

## 2014-09-02 DIAGNOSIS — I890 Lymphedema, not elsewhere classified: Secondary | ICD-10-CM | POA: Diagnosis present

## 2014-09-02 DIAGNOSIS — Z87891 Personal history of nicotine dependence: Secondary | ICD-10-CM

## 2014-09-02 DIAGNOSIS — Z86718 Personal history of other venous thrombosis and embolism: Secondary | ICD-10-CM | POA: Diagnosis not present

## 2014-09-02 DIAGNOSIS — G43909 Migraine, unspecified, not intractable, without status migrainosus: Secondary | ICD-10-CM | POA: Diagnosis present

## 2014-09-02 DIAGNOSIS — E785 Hyperlipidemia, unspecified: Secondary | ICD-10-CM | POA: Diagnosis present

## 2014-09-02 DIAGNOSIS — I2699 Other pulmonary embolism without acute cor pulmonale: Secondary | ICD-10-CM | POA: Diagnosis present

## 2014-09-02 DIAGNOSIS — A419 Sepsis, unspecified organism: Secondary | ICD-10-CM | POA: Diagnosis present

## 2014-09-02 DIAGNOSIS — N189 Chronic kidney disease, unspecified: Secondary | ICD-10-CM

## 2014-09-02 DIAGNOSIS — E662 Morbid (severe) obesity with alveolar hypoventilation: Secondary | ICD-10-CM | POA: Diagnosis present

## 2014-09-02 DIAGNOSIS — D631 Anemia in chronic kidney disease: Secondary | ICD-10-CM | POA: Diagnosis present

## 2014-09-02 DIAGNOSIS — I10 Essential (primary) hypertension: Secondary | ICD-10-CM

## 2014-09-02 DIAGNOSIS — F329 Major depressive disorder, single episode, unspecified: Secondary | ICD-10-CM | POA: Diagnosis present

## 2014-09-02 DIAGNOSIS — Z880 Allergy status to penicillin: Secondary | ICD-10-CM | POA: Diagnosis not present

## 2014-09-02 DIAGNOSIS — R609 Edema, unspecified: Secondary | ICD-10-CM

## 2014-09-02 LAB — CBC WITH DIFFERENTIAL/PLATELET
BASOS ABS: 0 10*3/uL (ref 0.0–0.1)
BASOS PCT: 0 % (ref 0–1)
EOS ABS: 0 10*3/uL (ref 0.0–0.7)
Eosinophils Relative: 0 % (ref 0–5)
HCT: 31.9 % — ABNORMAL LOW (ref 36.0–46.0)
Hemoglobin: 10.1 g/dL — ABNORMAL LOW (ref 12.0–15.0)
LYMPHS ABS: 0.5 10*3/uL — AB (ref 0.7–4.0)
Lymphocytes Relative: 4 % — ABNORMAL LOW (ref 12–46)
MCH: 28.6 pg (ref 26.0–34.0)
MCHC: 31.7 g/dL (ref 30.0–36.0)
MCV: 90.4 fL (ref 78.0–100.0)
Monocytes Absolute: 0.7 10*3/uL (ref 0.1–1.0)
Monocytes Relative: 5 % (ref 3–12)
NEUTROS PCT: 91 % — AB (ref 43–77)
Neutro Abs: 11.6 10*3/uL — ABNORMAL HIGH (ref 1.7–7.7)
PLATELETS: 238 10*3/uL (ref 150–400)
RBC: 3.53 MIL/uL — AB (ref 3.87–5.11)
RDW: 16.5 % — ABNORMAL HIGH (ref 11.5–15.5)
WBC: 12.7 10*3/uL — ABNORMAL HIGH (ref 4.0–10.5)

## 2014-09-02 LAB — BASIC METABOLIC PANEL
Anion gap: 11 (ref 5–15)
BUN: 35 mg/dL — ABNORMAL HIGH (ref 6–23)
CO2: 22 mmol/L (ref 19–32)
CREATININE: 1.66 mg/dL — AB (ref 0.50–1.10)
Calcium: 8.6 mg/dL (ref 8.4–10.5)
Chloride: 106 mEq/L (ref 96–112)
GFR, EST AFRICAN AMERICAN: 36 mL/min — AB (ref >90)
GFR, EST NON AFRICAN AMERICAN: 31 mL/min — AB (ref >90)
GLUCOSE: 166 mg/dL — AB (ref 70–99)
Potassium: 4.7 mmol/L (ref 3.5–5.1)
Sodium: 139 mmol/L (ref 135–145)

## 2014-09-02 LAB — I-STAT CG4 LACTIC ACID, ED: LACTIC ACID, VENOUS: 2.03 mmol/L (ref 0.5–2.2)

## 2014-09-02 MED ORDER — PROMETHAZINE HCL 25 MG PO TABS
25.0000 mg | ORAL_TABLET | Freq: Four times a day (QID) | ORAL | Status: DC | PRN
  Administered 2014-09-07 – 2014-09-09 (×2): 25 mg via ORAL
  Filled 2014-09-02 (×2): qty 1

## 2014-09-02 MED ORDER — HYDRALAZINE HCL 20 MG/ML IJ SOLN
5.0000 mg | INTRAMUSCULAR | Status: DC | PRN
  Administered 2014-09-03: 5 mg via INTRAVENOUS
  Filled 2014-09-02: qty 1

## 2014-09-02 MED ORDER — AMLODIPINE BESYLATE 10 MG PO TABS
10.0000 mg | ORAL_TABLET | Freq: Every day | ORAL | Status: DC
  Administered 2014-09-03 – 2014-09-09 (×8): 10 mg via ORAL
  Filled 2014-09-02 (×8): qty 1

## 2014-09-02 MED ORDER — JUVEN PO PACK
1.0000 | PACK | Freq: Two times a day (BID) | ORAL | Status: DC
  Administered 2014-09-03 – 2014-09-09 (×7): 1 via ORAL
  Filled 2014-09-02 (×14): qty 1

## 2014-09-02 MED ORDER — VANCOMYCIN HCL 10 G IV SOLR
2500.0000 mg | INTRAVENOUS | Status: AC
  Administered 2014-09-02: 2500 mg via INTRAVENOUS
  Filled 2014-09-02: qty 2500

## 2014-09-02 MED ORDER — ALLOPURINOL 100 MG PO TABS
100.0000 mg | ORAL_TABLET | Freq: Every day | ORAL | Status: DC
Start: 2014-09-03 — End: 2014-09-09
  Administered 2014-09-03 – 2014-09-09 (×7): 100 mg via ORAL
  Filled 2014-09-02 (×7): qty 1

## 2014-09-02 MED ORDER — ONDANSETRON HCL 4 MG PO TABS: 4.0000 mg | ORAL_TABLET | Freq: Four times a day (QID) | ORAL | Status: DC | PRN

## 2014-09-02 MED ORDER — VITAMIN D3 25 MCG (1000 UNIT) PO TABS
1000.0000 [IU] | ORAL_TABLET | Freq: Every day | ORAL | Status: DC
  Administered 2014-09-03 – 2014-09-09 (×7): 1000 [IU] via ORAL
  Filled 2014-09-02 (×7): qty 1

## 2014-09-02 MED ORDER — OXYCODONE HCL 5 MG PO TABS
10.0000 mg | ORAL_TABLET | ORAL | Status: DC | PRN
  Administered 2014-09-03 – 2014-09-08 (×9): 10 mg via ORAL
  Filled 2014-09-02 (×10): qty 2

## 2014-09-02 MED ORDER — METOPROLOL TARTRATE 50 MG PO TABS
75.0000 mg | ORAL_TABLET | Freq: Two times a day (BID) | ORAL | Status: DC
  Administered 2014-09-03 – 2014-09-09 (×14): 75 mg via ORAL
  Filled 2014-09-02 (×28): qty 1

## 2014-09-02 MED ORDER — GABAPENTIN 300 MG PO CAPS
300.0000 mg | ORAL_CAPSULE | Freq: Three times a day (TID) | ORAL | Status: DC
  Administered 2014-09-03 – 2014-09-09 (×20): 300 mg via ORAL
  Filled 2014-09-02 (×22): qty 1

## 2014-09-02 MED ORDER — SODIUM CHLORIDE 0.9 % IV SOLN
500.0000 mg | Freq: Once | INTRAVENOUS | Status: AC
  Administered 2014-09-03: 500 mg via INTRAVENOUS
  Filled 2014-09-02 (×3): qty 500

## 2014-09-02 MED ORDER — POLYETHYLENE GLYCOL 3350 17 G PO PACK
17.0000 g | PACK | Freq: Every day | ORAL | Status: DC
  Administered 2014-09-03 – 2014-09-09 (×5): 17 g via ORAL
  Filled 2014-09-02 (×7): qty 1

## 2014-09-02 MED ORDER — ACETAMINOPHEN 325 MG PO TABS
650.0000 mg | ORAL_TABLET | Freq: Once | ORAL | Status: AC
  Administered 2014-09-02: 650 mg via ORAL
  Filled 2014-09-02: qty 2

## 2014-09-02 MED ORDER — FUROSEMIDE 40 MG PO TABS
40.0000 mg | ORAL_TABLET | Freq: Every day | ORAL | Status: DC
  Administered 2014-09-03: 40 mg via ORAL
  Filled 2014-09-02: qty 1

## 2014-09-02 MED ORDER — VANCOMYCIN HCL 10 G IV SOLR
1500.0000 mg | INTRAVENOUS | Status: DC
  Administered 2014-09-03: 1500 mg via INTRAVENOUS
  Filled 2014-09-02 (×2): qty 1500

## 2014-09-02 MED ORDER — ACETAMINOPHEN 325 MG PO TABS
650.0000 mg | ORAL_TABLET | Freq: Four times a day (QID) | ORAL | Status: DC | PRN
  Administered 2014-09-02 – 2014-09-03 (×3): 650 mg via ORAL
  Filled 2014-09-02 (×3): qty 2

## 2014-09-02 MED ORDER — INSULIN ASPART 100 UNIT/ML ~~LOC~~ SOLN
0.0000 [IU] | Freq: Three times a day (TID) | SUBCUTANEOUS | Status: DC
  Administered 2014-09-03: 3 [IU] via SUBCUTANEOUS
  Administered 2014-09-03: 2 [IU] via SUBCUTANEOUS
  Administered 2014-09-03: 1 [IU] via SUBCUTANEOUS
  Administered 2014-09-04 (×2): 2 [IU] via SUBCUTANEOUS
  Administered 2014-09-04: 1 [IU] via SUBCUTANEOUS
  Administered 2014-09-05: 2 [IU] via SUBCUTANEOUS
  Administered 2014-09-05: 1 [IU] via SUBCUTANEOUS
  Administered 2014-09-06 (×2): 2 [IU] via SUBCUTANEOUS
  Administered 2014-09-07: 1 [IU] via SUBCUTANEOUS
  Administered 2014-09-07: 2 [IU] via SUBCUTANEOUS
  Administered 2014-09-08 – 2014-09-09 (×4): 1 [IU] via SUBCUTANEOUS

## 2014-09-02 MED ORDER — ZOLPIDEM TARTRATE 5 MG PO TABS
5.0000 mg | ORAL_TABLET | Freq: Every evening | ORAL | Status: DC | PRN
  Administered 2014-09-03 – 2014-09-09 (×6): 5 mg via ORAL
  Filled 2014-09-02 (×7): qty 1

## 2014-09-02 MED ORDER — HYDROMORPHONE HCL 1 MG/ML IJ SOLN
1.0000 mg | INTRAMUSCULAR | Status: DC | PRN
Start: 2014-09-02 — End: 2014-09-09
  Administered 2014-09-02 – 2014-09-08 (×10): 1 mg via INTRAVENOUS
  Filled 2014-09-02 (×11): qty 1

## 2014-09-02 MED ORDER — HEPARIN (PORCINE) IN NACL 100-0.45 UNIT/ML-% IJ SOLN
1850.0000 [IU]/h | INTRAMUSCULAR | Status: DC
  Administered 2014-09-03: 1850 [IU]/h via INTRAVENOUS
  Filled 2014-09-02 (×3): qty 250

## 2014-09-02 MED ORDER — SENNA 8.6 MG PO TABS
1.0000 | ORAL_TABLET | Freq: Two times a day (BID) | ORAL | Status: DC
  Administered 2014-09-03 – 2014-09-09 (×13): 8.6 mg via ORAL
  Filled 2014-09-02 (×13): qty 1

## 2014-09-02 MED ORDER — SODIUM CHLORIDE 0.9 % IJ SOLN
3.0000 mL | Freq: Two times a day (BID) | INTRAMUSCULAR | Status: DC
  Administered 2014-09-09: 3 mL via INTRAVENOUS

## 2014-09-02 MED ORDER — ISOSORBIDE MONONITRATE 10 MG PO TABS
10.0000 mg | ORAL_TABLET | Freq: Two times a day (BID) | ORAL | Status: DC
  Administered 2014-09-03 – 2014-09-09 (×14): 10 mg via ORAL
  Filled 2014-09-02 (×16): qty 1

## 2014-09-02 MED ORDER — ONDANSETRON HCL 4 MG/2ML IJ SOLN: 4.0000 mg | Freq: Four times a day (QID) | INTRAMUSCULAR | Status: DC | PRN

## 2014-09-02 MED ORDER — PANTOPRAZOLE SODIUM 40 MG PO TBEC
40.0000 mg | DELAYED_RELEASE_TABLET | Freq: Every day | ORAL | Status: DC
  Administered 2014-09-03 – 2014-09-09 (×7): 40 mg via ORAL
  Filled 2014-09-02 (×9): qty 1

## 2014-09-02 NOTE — ED Notes (Signed)
Per RN do not stick for second set of cultures at this time.

## 2014-09-02 NOTE — ED Notes (Signed)
Per EMS- from home-cellulitis and open sore on right leg with increased swelling and pitting edema. Fever 103.4 upon arrival. 1000 mg Tylenol PO given at 1709. VS: 168/94 HR 101 ST on monitor SpO2 95% on RA and RR 24. Chills and fever today. Denies N/V/D. Does have constipation. Has had increased malodorous urination without blood. A&Ox4.

## 2014-09-02 NOTE — ED Notes (Signed)
Bed: SU86 Expected date:  Expected time:  Means of arrival:  Comments: Hold for [employer]

## 2014-09-02 NOTE — ED Notes (Signed)
IV team in room attempting to start IV

## 2014-09-02 NOTE — H&P (Signed)
[facility] History and Physical  Kelly [name] TKP:[id number] DOB: [birth year] DOA: 09/02/2014  Referring physician: ED physician PCP: Alvester Chou, NP  Specialists:   Chief Complaint: leg pain  HPI: Kelly [name] is a 69 y.o. female with past medical history of Hypertension; Hyperlipidemia; diastolic CHF (congestive heart failure); GERD (gastroesophageal reflux disease); Depression; Lymphedema of leg; Pulmonary embolism (02/18/14); Chronic indwelling Foley catheter; DVT (deep venous thrombosis) (01/2014); Pneumonia (1990's X 2); Sleep apnea; Anemia; History of blood transfusion (1990's X 2); Migraines; Arthritis; Gout; Urinary incontinence; Chronic kidney disease (CKD), stage III (moderate); Neuropathy; and Diabetes mellitus, hx of group C Streptococcus bacteremia and severe sepsis due to left leg cellulitis Into left thigh in September, who presents with leg pain.   Patient is morbidly obese. She has chronic bilateral leg edema due to severe lymphedema. Patient reports that in the last week, her leg edema is getting worse. She started having severe pain over bilateral upper thighs, right side is worse than the left, particularly over the right medial thigh. She has tenderness and redness over right medial thigh. Patient has fever with temperature of 103.4 at home. Patient has mild nausea, but no vomiting, abdominal pain or diarrhea. She dose not have SOB, chest pain, unilateral weakness.   Also note, patient has history of DVT and pulmonary embolism 01/2014. She is supposed to take Eliquis, however she has not been taking this medications since July 19, 2014.    Work up in the ED demonstrates leukocytosis with a WBC 12.7, worsening renal function with creatinine up from a 1.2-1.6-6, fever with a temperature 101 in the emergency room. Patient is admitted to inpatient for further evaluation and treatment.  Review of Systems: As presented in the history of presenting illness, rest  negative.  Where does patient live?  With nephew Can patient participate in ADLs? none  Allergy:  Allergies  Allergen Reactions  . Bactrim [Sulfamethoxazole-Trimethoprim] Other (See Comments)    Hyperkalemia (July 2015)  . Penicillins Hives    Has taken cephalexin 06/2014    Past Medical History  Diagnosis Date  . Hypertension   . Hyperlipidemia   . CHF (congestive heart failure)   . GERD (gastroesophageal reflux disease)   . Depression   . Lymphedema of leg     left leg  . Pulmonary embolism 02/18/14    diagnosed at [facility] with [facility] angio chest  . Chronic indwelling Foley catheter   . Heart murmur     "just found out I have one" (04/07/2014)  . DVT (deep venous thrombosis) 01/2014    LLE; "went from my leg to my lungs"  . Pneumonia 1990's X 2  . Sleep apnea     doesn't use cpap machine or 02 at night (04/07/2014)  . Anemia   . History of blood transfusion 1990's X 2    "when they did my surgeries"  . Migraines     "none for years now" (04/07/2014)  . Arthritis     "arms, legs" (04/07/2014)  . Gout   . Urinary incontinence   . Chronic kidney disease (CKD), stage III (moderate)     [name] 04/07/2014  . Neuropathy   . Diabetes mellitus     "at one time" (04/07/2014)    Past Surgical History  Procedure Laterality Date  . Shoulder open rotator cuff repair Right ~ 2000  . Knee arthroscopy Right ?1980's  . Temporal artery biopsy / ligation Right ?1990's  . Colonoscopy with propofol  09/23/2012  Procedure: COLONOSCOPY WITH PROPOFOL;  Surgeon: Jerene Bears, MD;  Location: [facility];  Service: Gastroenterology;  Laterality: N/A;  . Panniculectomy Left 02/08/2014    w/wound debridement @ medial thigh  . Irrigation and debridement abscess Left 02/24/2014    Procedure: MINOR INCISION AND DRAINAGE OF ABSCESS;  Surgeon: Theodoro Kos, DO;  Location: [facility];  Service: Plastics;  Laterality: Left;  . I&d extremity Left 03/03/2014    Procedure: IRRIGATION AND DEBRIDEMENT LEFT LEG WOUND  AND PLACEMENT OF A CELL AND VAC;  Surgeon: Theodoro Kos, DO;  Location: [place];  Service: Plastics;  Laterality: Left;  . Incision and drainage of wound Left 03/16/2014    Procedure: IRRIGATION AND DEBRIDEMENT OF LEFT THIGH WOUND, APPLICATION ACELL;  Surgeon: Irene Limbo, MD;  Location: [place];  Service: Plastics;  Laterality: Left;  . Incision and drainage of wound Left 03/25/2014    Procedure: IRRIGATION AND DEBRIDEMENT OF LEFT LEG WOUND WITH PLACEMENT OF ACELL/VAC;  Surgeon: Theodoro Kos, DO;  Location: [place];  Service: Plastics;  Laterality: Left;  . Incision and drainage of wound Left 03/31/2014    Procedure: IRRIGATION AND DEBRIDEMENT LEFT LEG WOUND WITH PLACEMENT OF A-CELL/VAC;  Surgeon: Theodoro Kos, DO;  Location: [place];  Service: Plastics;  Laterality: Left;  . Cataract extraction w/ intraocular lens implant Right 2014  . Vena cava filter placement  02/2014  . Incision and drainage of wound Left 04/07/2014    Procedure: IRRIGATION AND DEBRIDEMENT LEFT LEG WOUND WITH PLACEMENT OF A CELL AND VAC;  Surgeon: Theodoro Kos, DO;  Location: [place];  Service: Plastics;  Laterality: Left;  . Incision and drainage of wound Left 04/14/2014    Procedure: IRRIGATION AND DEBRIDEMENT WOUND;  Surgeon: Theodoro Kos, DO;  Location: [place];  Service: Plastics;  Laterality: Left;  . Application of a-cell of extremity Left 04/14/2014    Procedure: APPLICATION OF A-CELL OF EXTREMITY;  Surgeon: Theodoro Kos, DO;  Location: [place];  Service: Plastics;  Laterality: Left;  Marland Kitchen Minor application of wound vac Left 04/14/2014    Procedure: MINOR APPLICATION OF WOUND VAC;  Surgeon: Theodoro Kos, DO;  Location: [place];  Service: Plastics;  Laterality: Left;  . Incision and drainage of wound Left 04/21/2014    Procedure: IRRIGATION AND DEBRIDEMENT OF LEFT UPPER LEG WOUND WITH PLACEMENT OF ACELL/VAC;  Surgeon: Theodoro Kos, DO;  Location: [place];  Service: Plastics;  Laterality: Left;    Social History:  reports that she  quit smoking about 33 years ago. Her smoking use included Cigarettes. She has a 33 pack-year smoking history. She has never used smokeless tobacco. She reports that she drinks alcohol. She reports that she does not use illicit drugs.  Family History:  Family History  Problem Relation Age of Onset  . Emphysema Father   . Asthma Brother   . Heart disease Mother   . Stomach cancer Brother   . Heart disease Maternal Grandmother   . Diabetes Mother   . Diabetes Sister      Prior to Admission medications   Medication Sig Start Date End Date Taking? Authorizing Provider  acetaminophen (TYLENOL) 325 MG tablet Take 650 mg by mouth every 6 (six) hours as needed for fever.   Yes Historical Provider, MD  allopurinol (ZYLOPRIM) 100 MG tablet Take 100 mg by mouth daily.    Yes Historical Provider, MD  cholecalciferol (VITAMIN D) 1000 UNITS tablet Take 1,000 Units by mouth daily.   Yes Historical Provider, MD  esomeprazole (  NEXIUM) 40 MG capsule Take 40 mg by mouth daily before breakfast. Take on an empty stomach   Yes Historical Provider, MD  furosemide (LASIX) 40 MG tablet Take 40 mg by mouth 2 (two) times daily.    Yes Historical Provider, MD  gabapentin (NEURONTIN) 300 MG capsule Take 300 mg by mouth 3 (three) times daily.   Yes Historical Provider, MD  isosorbide mononitrate (ISMO,MONOKET) 10 MG tablet Take 10 mg by mouth 2 (two) times daily.   Yes Historical Provider, MD  metoprolol tartrate (LOPRESSOR) 25 MG tablet Take 3 tablets (75 mg total) by mouth 2 (two) times daily. 05/14/14  Yes Orson Eva, MD  Nutritional Supplements (JUVEN NUTRIVIGOR) PACK Take 1 Container by mouth 2 (two) times daily.   Yes Historical Provider, MD  Oxycodone HCl 10 MG TABS Take 1 tablet (10 mg total) by mouth every 4 (four) hours as needed (moderate pain). 07/18/14  Yes Theodis Blaze, MD  polyethylene glycol (MIRALAX / Floria Raveling) packet Take 17 g by mouth daily. Mix with 4-6 oz liquid   Yes Historical Provider, MD   promethazine (PHENERGAN) 25 MG tablet Take 25 mg by mouth every 6 (six) hours as needed for nausea or vomiting.   Yes Historical Provider, MD  senna (SENOKOT) 8.6 MG TABS tablet Take 1 tablet (8.6 mg total) by mouth 2 (two) times daily. 04/09/14  Yes Marianne L [name], PA-C  zolpidem (AMBIEN) 5 MG tablet Take 1 tablet (5 mg total) by mouth at bedtime as needed for sleep. 07/18/14  Yes Theodis Blaze, MD  apixaban (ELIQUIS) 5 MG TABS tablet Take 1 tablet (5 mg total) by mouth 2 (two) times daily. Patient not taking: Reported on 09/02/2014 05/14/14   Orson Eva, MD  cephALEXin (KEFLEX) 500 MG capsule Take 1 capsule (500 mg total) by mouth 4 (four) times daily. 07/19/14   Nishant Dhungel, MD    Physical Exam: Filed Vitals:   09/02/14 1920 09/02/14 2153 09/02/14 2300 09/02/14 2358  BP: 194/56 180/61  197/63  Pulse: 92 92  118  Temp: 101 F (38.3 C) 100.5 F (38.1 C)  102 F (38.9 C)  TempSrc: Oral Oral  Oral  Resp: 16 20  20   Height:   5\' 6"  (1.676 m)   Weight:   161.8 kg (356 lb 11.3 oz)   SpO2: 92% 96%  95%   General: Not in acute distress HEENT:       Eyes: PERRL, EOMI, no scleral icterus       ENT: No discharge from the ears and nose, no pharynx injection, no tonsillar enlargement.        Neck: No JVD, no bruit, no mass felt. Cardiac: S1/S2, RRR, No murmurs, No gallops or rubs Pulm: Good air movement bilaterally. Clear to auscultation bilaterally. No rales, wheezing, rhonchi or rubs. Abd: Soft, nondistended, nontender, no rebound pain, no organomegaly, BS present Ext: 2+ pitting leg edema bilaterally. 2+DP/PT pulse bilaterally Musculoskeletal: No joint deformities. Skin:  Left inner thigh with 8-10 cm healing wound which seems to be healing well, with good granulation tissue and with no purulent discharge. There is large area over the right medial thigh with redness, warmth and tenderness. Neuro: Alert and oriented X3, cranial nerves II-XII grossly intact, muscle strength 5/5 in all  extremeties, sensation to light touch intact.  Psych: Patient is not psychotic, no suicidal or hemocidal ideation.  Labs on Admission:  Basic Metabolic Panel:  Recent Labs Lab 09/02/14 1952  NA 139  K 4.7  CL 106  CO2 22  GLUCOSE 166*  BUN 35*  CREATININE 1.66*  CALCIUM 8.6   Liver Function Tests: No results for input(s): AST, ALT, ALKPHOS, BILITOT, PROT, ALBUMIN in the last 168 hours. No results for input(s): LIPASE, AMYLASE in the last 168 hours. No results for input(s): AMMONIA in the last 168 hours. CBC:  Recent Labs Lab 09/02/14 1952  WBC 12.7*  NEUTROABS 11.6*  HGB 10.1*  HCT 31.9*  MCV 90.4  PLT 238   Cardiac Enzymes: No results for input(s): CKTOTAL, CKMB, CKMBINDEX, TROPONINI in the last 168 hours.  BNP (last 3 results)  Recent Labs  07/11/14 1959  PROBNP 2482.0*   CBG: No results for input(s): GLUCAP in the last 168 hours.  Radiological Exams on Admission: US Renal  09/02/2014   CLINICAL DATA:  Acute kidney injury.  EXAM: RENAL/URINARY TRACT ULTRASOUND COMPLETE  COMPARISON:  None.  FINDINGS: Technically limited exam due to body habitus.  Right Kidney:  Length: 12.8 Cm. Echogenicity within normal limits. No mass or hydronephrosis visualized.  Left Kidney:  Length: 12.2 cm. Echogenicity within normal limits. No mass or hydronephrosis visualized.  Bladder:  Appears normal for degree of bladder distention. Both ureteral jets are seen.  IMPRESSION: No hydronephrosis or obstructive uropathy.   Electronically Signed   By: Jeb Levering M.D.   On: 09/02/2014 23:58    EKG: will get  one  Assessment/Plan Principal Problem:   Cellulitis, leg Active Problems:   Essential hypertension   Diabetes mellitus type 2 with neurological manifestations   Acute on chronic renal failure   Morbid obesity   Pulmonary embolism   Anemia of chronic renal failure, stage 3 (moderate)   Diabetic neuropathy   Gout   DM type 2 causing CKD stage 3   Coronary  atherosclerosis of native coronary artery   Sepsis   DVT (deep venous thrombosis)   Cellulitis of Right leg: Patient has redness, warmth and tenderness over right medial thigh, which is consistent with a cellulitis. Patient has mild sepsis with fever and leukocytosis. Currently hemodynamically stable, actually his blood pressure is elevated on admission.   -admitted to telemetry bed given history of pulmonary embolism and congestive heart failure. -start IV vancomycin and Primaxin (allergic to PCN) - follow up blood culture -watch bp pressure carefully, will d/c lasix and bp med if bp drops. - Symptomatic treatment: oxycodone and dilaudid, tylenol for fever  - Check lactic acid  -consult to wound care team  Type 2 diabetes: Last A1c was 6.7 on 07/12/14, not on medications at home -Sliding-scale insulin  History of PE/DVT/ status post IVC filter: Diagnosed 02/18/2014. Not taking her Eliquis since 07/19/14 at home  -start Heparin gtt now  Obesity hypoventilation syndrome/obstructive sleep apnea: -CPAP prn  Chronic diastolic heart failure/coronary artery disease: 2-D on11/17/15 showed a year 55-60%. Patient is on Lasix 40 mg twice a day for both congestive heart failure and chronic elg lymphedema. -will continue lasix, but decrease from 40 mg bid to qdaily. will d/c if bp drops -Continue her Toprol and isosorbide mononitrate  -check BNP  Hypertension: bp=194/56. -Continue Lopressor and Imdur. -start Amlodipin  Gastroesophageal reflux disease PPI.  AoCKD-III: Baseline creatinine 1.2. Her creatinine is 1.66 on admission. - will decrease lasix from 40 bid to qdaily - check FeUrea and US-renal - follow  Up renal fx by BMP  DVT ppx: on heparin gttt Code Status: Full code Family Communication: Yes, patient's   niece    at bed side Disposition  Plan: Admit to inpatient   Date of Service 09/03/2014    Ivor Costa [facility] Pager [phone]  If 7PM-7AM, please contact  night-coverage [email or web address] Password [email or web address] 09/03/2014, 1:56 AM

## 2014-09-02 NOTE — Progress Notes (Addendum)
ANTIBIOTIC CONSULT NOTE - INITIAL  Pharmacy Consult for Vancomycin/primaxin Indication: Cellulitis  Allergies  Allergen Reactions  . Bactrim [Sulfamethoxazole-Trimethoprim] Other (See Comments)    Hyperkalemia (July 2015)  . Penicillins Hives    Patient Measurements:   Weight ~150 kg as of 07/12/14  Vital Signs: Temp: 101 F (38.3 C) (01/07 1920) Temp Source: Oral (01/07 1920) BP: 194/56 mmHg (01/07 1920) Pulse Rate: 92 (01/07 1920) Intake/Output from previous day:   Intake/Output from this shift:    Labs:  Recent Labs  09/02/14 1952  WBC 12.7*  HGB 10.1*  PLT 238  CREATININE 1.66*   CrCl cannot be calculated (Unknown ideal weight.). No results for input(s): VANCOTROUGH, VANCOPEAK, VANCORANDOM, GENTTROUGH, GENTPEAK, GENTRANDOM, TOBRATROUGH, TOBRAPEAK, TOBRARND, AMIKACINPEAK, AMIKACINTROU, AMIKACIN in the last 72 hours.   Microbiology: No results found for this or any previous visit (from the past 720 hour(s)).  Medical History: Past Medical History  Diagnosis Date  . Hypertension   . Hyperlipidemia   . CHF (congestive heart failure)   . GERD (gastroesophageal reflux disease)   . Depression   . Lymphedema of leg     left leg  . Pulmonary embolism 02/18/14    diagnosed at [facility] with CT angio chest  . Chronic indwelling Foley catheter   . Heart murmur     "just found out I have one" (04/07/2014)  . DVT (deep venous thrombosis) 01/2014    LLE; "went from my leg to my lungs"  . Pneumonia 1990's X 2  . Sleep apnea     doesn't use cpap machine or 02 at night (04/07/2014)  . Anemia   . History of blood transfusion 1990's X 2    "when they did my surgeries"  . Migraines     "none for years now" (04/07/2014)  . Arthritis     "arms, legs" (04/07/2014)  . Gout   . Urinary incontinence   . Chronic kidney disease (CKD), stage III (moderate)     [name] 04/07/2014  . Neuropathy   . Diabetes mellitus     "at one time" (04/07/2014)    Assessment: 69 yo morbidly  obese female brought from home by EMS with cellulitis and open sore on right leg with increased swelling and pitting edema, malodorous urination. Temp 103.4 and chills upon arrival. Patient has history of chronic leg ulcer s/p wound VAC and recurrent hip and thigh cellulitis with hx group C strep infection and sepsis. Pharmacy consulted to dose vancomycin.  1/7 >> Vancomycin >>  Tmax: 103.4 >> 101 WBC: 12.7k Renal: CKD, SCr baseline ~1.2, today 1.66 with CrCl 37 ml/min Normalized Lactate: 2.03  1/7 blood x 2: sent  Goal of Therapy:  Vancomycin trough level 15-20 mcg/ml  Primaxin dosed based on patient weight and renal function   Plan:   Vancomycin 2500mg  IV x 1 dose NOW, then 1500mg  IV 24h Check trough at steady state Follow up renal function & cultures, clinical course Primaxin 500mg  iv q8hr  Peggyann Juba, PharmD, BCPS Pager: [phone] 09/02/2014,9:06 PM

## 2014-09-02 NOTE — ED Notes (Signed)
Per Nurse Lattie Haw don;t draw the protime-INR it's under admission orders.

## 2014-09-02 NOTE — ED Notes (Signed)
[facility]W called and they need a bed with a scale on it  They will call when bed is ready

## 2014-09-03 ENCOUNTER — Other Ambulatory Visit: Payer: Self-pay

## 2014-09-03 DIAGNOSIS — I2510 Atherosclerotic heart disease of native coronary artery without angina pectoris: Secondary | ICD-10-CM

## 2014-09-03 DIAGNOSIS — D631 Anemia in chronic kidney disease: Secondary | ICD-10-CM

## 2014-09-03 DIAGNOSIS — E1122 Type 2 diabetes mellitus with diabetic chronic kidney disease: Secondary | ICD-10-CM

## 2014-09-03 DIAGNOSIS — E0843 Diabetes mellitus due to underlying condition with diabetic autonomic (poly)neuropathy: Secondary | ICD-10-CM

## 2014-09-03 DIAGNOSIS — A419 Sepsis, unspecified organism: Principal | ICD-10-CM

## 2014-09-03 DIAGNOSIS — N183 Chronic kidney disease, stage 3 (moderate): Secondary | ICD-10-CM

## 2014-09-03 LAB — COMPREHENSIVE METABOLIC PANEL
ALBUMIN: 2.6 g/dL — AB (ref 3.5–5.2)
ALT: 14 U/L (ref 0–35)
AST: 22 U/L (ref 0–37)
Alkaline Phosphatase: 103 U/L (ref 39–117)
Anion gap: 7 (ref 5–15)
BUN: 33 mg/dL — ABNORMAL HIGH (ref 6–23)
CALCIUM: 7.8 mg/dL — AB (ref 8.4–10.5)
CO2: 20 mmol/L (ref 19–32)
CREATININE: 1.65 mg/dL — AB (ref 0.50–1.10)
Chloride: 106 mEq/L (ref 96–112)
GFR calc Af Amer: 36 mL/min — ABNORMAL LOW (ref >90)
GFR calc non Af Amer: 31 mL/min — ABNORMAL LOW (ref >90)
Glucose, Bld: 185 mg/dL — ABNORMAL HIGH (ref 70–99)
POTASSIUM: 4.3 mmol/L (ref 3.5–5.1)
Sodium: 133 mmol/L — ABNORMAL LOW (ref 135–145)
Total Bilirubin: 0.6 mg/dL (ref 0.3–1.2)
Total Protein: 7.5 g/dL (ref 6.0–8.3)

## 2014-09-03 LAB — HEMOGLOBIN A1C
Hgb A1c MFr Bld: 6.6 % — ABNORMAL HIGH (ref <5.7)
MEAN PLASMA GLUCOSE: 143 mg/dL — AB (ref <117)

## 2014-09-03 LAB — GLUCOSE, CAPILLARY
GLUCOSE-CAPILLARY: 166 mg/dL — AB (ref 70–99)
GLUCOSE-CAPILLARY: 141 mg/dL — AB (ref 70–99)
Glucose-Capillary: 208 mg/dL — ABNORMAL HIGH (ref 70–99)
Glucose-Capillary: 134 mg/dL — ABNORMAL HIGH (ref 70–99)

## 2014-09-03 LAB — URINALYSIS, ROUTINE W REFLEX MICROSCOPIC
Bilirubin Urine: NEGATIVE
Glucose, UA: NEGATIVE mg/dL
Hgb urine dipstick: NEGATIVE
Ketones, ur: NEGATIVE mg/dL
LEUKOCYTES UA: NEGATIVE
Nitrite: NEGATIVE
SPECIFIC GRAVITY, URINE: 1.018 (ref 1.005–1.030)
Urobilinogen, UA: 0.2 mg/dL (ref 0.0–1.0)
pH: 6 (ref 5.0–8.0)

## 2014-09-03 LAB — CREATININE, URINE, RANDOM: Creatinine, Urine: 84.72 mg/dL

## 2014-09-03 LAB — UREA NITROGEN, URINE: Urea Nitrogen, Ur: 616 mg/dL

## 2014-09-03 LAB — CBC
HEMATOCRIT: 28.5 % — AB (ref 36.0–46.0)
Hemoglobin: 8.9 g/dL — ABNORMAL LOW (ref 12.0–15.0)
MCH: 27.9 pg (ref 26.0–34.0)
MCHC: 31.2 g/dL (ref 30.0–36.0)
MCV: 89.3 fL (ref 78.0–100.0)
Platelets: ADEQUATE 10*3/uL (ref 150–400)
RBC: 3.19 MIL/uL — ABNORMAL LOW (ref 3.87–5.11)
RDW: 16.6 % — ABNORMAL HIGH (ref 11.5–15.5)
WBC: 14.8 10*3/uL — ABNORMAL HIGH (ref 4.0–10.5)

## 2014-09-03 LAB — URINE MICROSCOPIC-ADD ON

## 2014-09-03 LAB — PROTIME-INR
INR: 1.15 (ref 0.00–1.49)
Prothrombin Time: 14.8 seconds (ref 11.6–15.2)

## 2014-09-03 LAB — BRAIN NATRIURETIC PEPTIDE: B NATRIURETIC PEPTIDE 5: 507.4 pg/mL — AB (ref 0.0–100.0)

## 2014-09-03 LAB — APTT: aPTT: 35 seconds (ref 24–37)

## 2014-09-03 LAB — MRSA PCR SCREENING: MRSA by PCR: POSITIVE — AB

## 2014-09-03 LAB — HEPARIN LEVEL (UNFRACTIONATED): HEPARIN UNFRACTIONATED: 0.26 [IU]/mL — AB (ref 0.30–0.70)

## 2014-09-03 LAB — LACTIC ACID, PLASMA: Lactic Acid, Venous: 1.8 mmol/L (ref 0.5–2.2)

## 2014-09-03 MED ORDER — ACETAMINOPHEN 500 MG PO TABS
1000.0000 mg | ORAL_TABLET | Freq: Four times a day (QID) | ORAL | Status: DC | PRN
  Administered 2014-09-07: 1000 mg via ORAL
  Filled 2014-09-03: qty 2

## 2014-09-03 MED ORDER — SODIUM CHLORIDE 0.9 % IJ SOLN
10.0000 mL | Freq: Two times a day (BID) | INTRAMUSCULAR | Status: DC
  Administered 2014-09-03: 10 mL

## 2014-09-03 MED ORDER — SODIUM CHLORIDE 0.9 % IV SOLN
500.0000 mg | Freq: Three times a day (TID) | INTRAVENOUS | Status: DC
  Administered 2014-09-03 – 2014-09-04 (×4): 500 mg via INTRAVENOUS
  Filled 2014-09-03 (×5): qty 500

## 2014-09-03 MED ORDER — HEPARIN BOLUS VIA INFUSION
4000.0000 [IU] | Freq: Once | INTRAVENOUS | Status: AC
  Administered 2014-09-03: 4000 [IU] via INTRAVENOUS
  Filled 2014-09-03: qty 4000

## 2014-09-03 MED ORDER — SODIUM CHLORIDE 0.9 % IJ SOLN
10.0000 mL | INTRAMUSCULAR | Status: DC | PRN
  Administered 2014-09-04 – 2014-09-05 (×2): 10 mL
  Filled 2014-09-03 (×2): qty 40

## 2014-09-03 MED ORDER — FUROSEMIDE 10 MG/ML IJ SOLN
40.0000 mg | Freq: Two times a day (BID) | INTRAMUSCULAR | Status: DC
  Administered 2014-09-03 – 2014-09-09 (×12): 40 mg via INTRAVENOUS
  Filled 2014-09-03 (×13): qty 4

## 2014-09-03 NOTE — Progress Notes (Signed)
ANTICOAGULATION CONSULT NOTE - Initial Consult  Pharmacy Consult for Heparin Indication: pulmonary embolus  Allergies  Allergen Reactions  . Bactrim [Sulfamethoxazole-Trimethoprim] Other (See Comments)    Hyperkalemia (July 2015)  . Penicillins Hives    Has taken cephalexin 06/2014    Patient Measurements: Height: 5\' 6"  (167.6 cm) Weight: (!) 356 lb 11.3 oz (161.8 kg) IBW/kg (Calculated) : 59.3 Heparin Dosing Weight:   Vital Signs: Temp: 102 F (38.9 C) (01/07 2358) Temp Source: Oral (01/07 2358) BP: 197/63 mmHg (01/07 2358) Pulse Rate: 118 (01/07 2358)  Labs:  Recent Labs  09/02/14 1952 09/03/14 0010 09/03/14 0015  HGB 10.1*  --   --   HCT 31.9*  --   --   PLT 238  --   --   APTT  --  35  --   LABPROT  --   --  14.8  INR  --   --  1.15  CREATININE 1.66*  --   --     Estimated Creatinine Clearance: 52.1 mL/min (by C-G formula based on Cr of 1.66).   Medical History: Past Medical History  Diagnosis Date  . Hypertension   . Hyperlipidemia   . CHF (congestive heart failure)   . GERD (gastroesophageal reflux disease)   . Depression   . Lymphedema of leg     left leg  . Pulmonary embolism 02/18/14    diagnosed at [facility] with CT angio chest  . Chronic indwelling Foley catheter   . Heart murmur     "just found out I have one" (04/07/2014)  . DVT (deep venous thrombosis) 01/2014    LLE; "went from my leg to my lungs"  . Pneumonia 1990's X 2  . Sleep apnea     doesn't use cpap machine or 02 at night (04/07/2014)  . Anemia   . History of blood transfusion 1990's X 2    "when they did my surgeries"  . Migraines     "none for years now" (04/07/2014)  . Arthritis     "arms, legs" (04/07/2014)  . Gout   . Urinary incontinence   . Chronic kidney disease (CKD), stage III (moderate)     [name] 04/07/2014  . Neuropathy   . Diabetes mellitus     "at one time" (04/07/2014)    Medications:  Prescriptions prior to admission  Medication Sig Dispense Refill Last Dose   . acetaminophen (TYLENOL) 325 MG tablet Take 650 mg by mouth every 6 (six) hours as needed for fever.   Past Month at Unknown time  . allopurinol (ZYLOPRIM) 100 MG tablet Take 100 mg by mouth daily.    09/02/2014 at Unknown time  . cholecalciferol (VITAMIN D) 1000 UNITS tablet Take 1,000 Units by mouth daily.   09/02/2014 at Unknown time  . esomeprazole (NEXIUM) 40 MG capsule Take 40 mg by mouth daily before breakfast. Take on an empty stomach   09/02/2014 at Unknown time  . furosemide (LASIX) 40 MG tablet Take 40 mg by mouth 2 (two) times daily.    09/02/2014 at Unknown time  . gabapentin (NEURONTIN) 300 MG capsule Take 300 mg by mouth 3 (three) times daily.   09/02/2014 at Unknown time  . isosorbide mononitrate (ISMO,MONOKET) 10 MG tablet Take 10 mg by mouth 2 (two) times daily.   09/02/2014 at Unknown time  . metoprolol tartrate (LOPRESSOR) 25 MG tablet Take 3 tablets (75 mg total) by mouth 2 (two) times daily. 180 tablet 0 09/02/2014 at 1000  . Nutritional  Supplements (JUVEN NUTRIVIGOR) PACK Take 1 Container by mouth 2 (two) times daily.   Past Week at Unknown time  . Oxycodone HCl 10 MG TABS Take 1 tablet (10 mg total) by mouth every 4 (four) hours as needed (moderate pain). 180 tablet 0 09/02/2014 at Unknown time  . polyethylene glycol (MIRALAX / GLYCOLAX) packet Take 17 g by mouth daily. Mix with 4-6 oz liquid   unknown at unknown  . promethazine (PHENERGAN) 25 MG tablet Take 25 mg by mouth every 6 (six) hours as needed for nausea or vomiting.   Past Week at Unknown time  . senna (SENOKOT) 8.6 MG TABS tablet Take 1 tablet (8.6 mg total) by mouth 2 (two) times daily. 120 each 0 09/02/2014 at Unknown time  . zolpidem (AMBIEN) 5 MG tablet Take 1 tablet (5 mg total) by mouth at bedtime as needed for sleep. 30 tablet 5 Past Week at Unknown time  . apixaban (ELIQUIS) 5 MG TABS tablet Take 1 tablet (5 mg total) by mouth 2 (two) times daily. (Patient not taking: Reported on 09/02/2014) 60 tablet 0 Not Taking at Unknown  time  . cephALEXin (KEFLEX) 500 MG capsule Take 1 capsule (500 mg total) by mouth 4 (four) times daily. 20 capsule 0    Infusions:  . heparin 1,850 Units/hr (09/03/14 0144)    Assessment: Patient with hx of PE.  Per MD: patient supposted to take Eliquis, but not taking it since 07/19/14.  Baseline labs WNL.   Goal of Therapy:  Heparin level 0.3-0.7 units/ml Monitor platelets by anticoagulation protocol: Yes   Plan:  Heparin bolus 4000 units iv x1 Heparin drip at 1850 units/hr Daily CBC Heparin level at [place], Shea Stakes [place] 09/03/2014,2:27 AM

## 2014-09-03 NOTE — H&P (Deleted)
[facility] PROGRESS NOTE   BREASIA KARGES EZM:[id number] DOB: August 25, 1946 DOA: 09/02/2014 PCP: Alvester Chou, NP  HPI/Subjective: Feels better, cannot get out of the bed, feels wet all the time as [place]. Asked for Foley catheter, Foley will be placed.  Assessment/Plan: Principal Problem:   Cellulitis, leg Active Problems:   Essential hypertension   Diabetes mellitus type 2 with neurological manifestations   Acute on chronic renal failure   Morbid obesity   Pulmonary embolism   Anemia of chronic renal failure, stage 3 (moderate)   Diabetic neuropathy   Gout   DM type 2 causing CKD stage 3   Coronary atherosclerosis of native coronary artery   Sepsis   DVT (deep venous thrombosis)    Cellulitis of the lower extremities, severe non-purulent Bilateral low-density cellulitis, patient has chronic lymphedema and recurrent cellulitis bilaterally. Had previous sepsis from cellulitis. Blood cultures obtained. Started on vancomycin and Primaxin, continued.  Sepsis Presented with temp of 102 and heart rate of 118 the presence of cellulitis. Started on broad-spectrum antibiotics, namely vancomycin and Primaxin. No aggressive IV fluid hydration because of lower extremity edema and concerns of fluid overload.  Diabetes mellitus type 2 Insulin sliding scale, check hemoglobin A1c. Carbohydrate modified diet.  CKD stage III Likely secondary to diabetic nephropathy and history of hypertension. Baseline creatinine about 1.3, presented with creatinine of 1.6. Has lower extremity edema elevate lower extremities and continue Lasix.   History of PE/DVT Has history of PE/DVT reportedly last year, she was been on Eliquis for about 5 months. Last lower extremity Doppler done on 05/11/2014 and showed no evidence of DVT involving the lower extremities. Patient was on heparin drip, I do not see any indication to continue therapeutic heparin, discontinue heparin drip.   Code  Status: Full code Family Communication: Plan discussed with the patient. Disposition Plan: Remains inpatient   Consultants:  None  Procedures:  None  Antibiotics:  Vancomycin and Primaxin   Objective: Filed Vitals:   09/03/14 1100  BP:   Pulse:   Temp: 100.6 F (38.1 C)  Resp:     Intake/Output Summary (Last 24 hours) at 09/03/14 1359 Last data filed at 09/03/14 1245  Gross per 24 hour  Intake 403.81 ml  Output      0 ml  Net 403.81 ml   Filed Weights   09/02/14 2300 09/03/14 0500  Weight: 161.8 kg (356 lb 11.3 oz) 162.2 kg (357 lb 9.4 oz)    Exam: General: Alert and awake, oriented x3, not in any acute distress. HEENT: anicteric sclera, pupils reactive to light and accommodation, EOMI CVS: S1-S2 clear, no murmur rubs or gallops Chest: clear to auscultation bilaterally, no wheezing, rales or rhonchi Abdomen: soft nontender, nondistended, normal bowel sounds, no organomegaly Extremities: no cyanosis, clubbing or edema noted bilaterally Neuro: Cranial nerves II-XII intact, no focal neurological deficits  Data Reviewed: Basic Metabolic Panel:  Recent Labs Lab 09/02/14 1952 09/03/14 0407  NA 139 133*  K 4.7 4.3  CL 106 106  CO2 22 20  GLUCOSE 166* 185*  BUN 35* 33*  CREATININE 1.66* 1.65*  CALCIUM 8.6 7.8*   Liver Function Tests:  Recent Labs Lab 09/03/14 0407  AST 22  ALT 14  ALKPHOS 103  BILITOT 0.6  PROT 7.5  ALBUMIN 2.6*   No results for input(s): LIPASE, AMYLASE in the last 168 hours. No results for input(s): AMMONIA in the last 168 hours. CBC:  Recent Labs Lab 09/02/14 1952 09/03/14 0407  WBC 12.7* 14.8*  NEUTROABS 11.6*  --   HGB 10.1* 8.9*  HCT 31.9* 28.5*  MCV 90.4 89.3  PLT 238 PLATELET CLUMPS NOTED ON SMEAR, COUNT APPEARS ADEQUATE   Cardiac Enzymes: No results for input(s): CKTOTAL, CKMB, CKMBINDEX, TROPONINI in the last 168 hours. BNP (last 3 results)  Recent Labs  07/11/14 1959  PROBNP 2482.0*    CBG:  Recent Labs Lab 09/03/14 0807 09/03/14 1153  GLUCAP 166* 208*    Micro Recent Results (from the past 240 hour(s))  MRSA PCR Screening     Status: Abnormal   Collection Time: 09/03/14  2:25 AM  Result Value Ref Range Status   MRSA by PCR POSITIVE (A) NEGATIVE Final    Comment:        The GeneXpert MRSA Assay (FDA approved for NASAL specimens only), is one component of a comprehensive MRSA colonization surveillance program. It is not intended to diagnose MRSA infection nor to guide or monitor treatment for MRSA infections. RESULT CALLED TO, READ BACK BY AND VERIFIED WITH: Donne Anon RN AT [phone] ON 01.08.16 BY SHUEA      Studies: US Renal  09/02/2014   CLINICAL DATA:  Acute kidney injury.  EXAM: RENAL/URINARY TRACT ULTRASOUND COMPLETE  COMPARISON:  None.  FINDINGS: Technically limited exam due to body habitus.  Right Kidney:  Length: 12.8 Cm. Echogenicity within normal limits. No mass or hydronephrosis visualized.  Left Kidney:  Length: 12.2 cm. Echogenicity within normal limits. No mass or hydronephrosis visualized.  Bladder:  Appears normal for degree of bladder distention. Both ureteral jets are seen.  IMPRESSION: No hydronephrosis or obstructive uropathy.   Electronically Signed   By: Jeb Levering M.D.   On: 09/02/2014 23:58    Scheduled Meds: . allopurinol  100 mg Oral Daily  . amLODipine  10 mg Oral Daily  . cholecalciferol  1,000 Units Oral Daily  . furosemide  40 mg Oral Daily  . gabapentin  300 mg Oral TID  . imipenem-cilastatin  500 mg Intravenous 3 times per day  . insulin aspart  0-9 Units Subcutaneous TID WC  . isosorbide mononitrate  10 mg Oral BID  . metoprolol tartrate  75 mg Oral BID  . nutrition supplement (JUVEN)  1 packet Oral BID  . pantoprazole  40 mg Oral Daily  . polyethylene glycol  17 g Oral Daily  . senna  1 tablet Oral BID  . sodium chloride  3 mL Intravenous Q12H  . vancomycin  1,500 mg Intravenous Q24H   Continuous Infusions:       Time spent: 35 minutes    [facility] A  [facility] Pager [phone] If 7PM-7AM, please contact night-coverage at [email or web address], password [facility] 09/03/2014, 1:59 PM  LOS: 1 day

## 2014-09-03 NOTE — Progress Notes (Deleted)
Will hold 0600 Primaxin due to lack of venous access. IV team was only able to secure 1 line and patient has heparin running. MD agreed with RNs that patient needs a PICC and IV team will place one in the morning. When PICC is present, the antibiotic will be given. Will continue to monitor patient and report to day shift.

## 2014-09-03 NOTE — Progress Notes (Signed)
IV consult for PICC entered. Primary RN notified that we cannot place a PICC without an MD order (not a consult) and that she needed to enter or have the MD enter the order.

## 2014-09-03 NOTE — Progress Notes (Signed)
INITIAL NUTRITION ASSESSMENT  DOCUMENTATION CODES Per approved criteria  -Morbid Obesity   INTERVENTION: -Recommend Juven supplement BID to assist with skin integrity -Encouraged pt to continue with Juven supplement upon d/c; with incorporation of high protein snack if unable to consume Juven BID -RD to continue to monitor  NUTRITION DIAGNOSIS: Increased nutrient needs related to wound healing as evidenced by left leg wound.   Goal: Pt to meet >/= 90% of their estimated nutrition needs    Monitor:  Total protein intake, labs, weights, glucose profile, education needs  Reason for Assessment: MST  69 y.o. female  Admitting Dx: Cellulitis, leg  ASSESSMENT: 69 y.o. Female. Patient is morbidly obese. She has chronic bilateral leg edema due to severe lymphedema. Patient reports that in the last week, her leg edema is getting worse. She started having severe pain over bilateral upper thighs, right side is worse than the left, particularly over the right medial thigh. She has tenderness and redness over right medial thigh. Patient has fever with temperature of 103.4 at home  -Pt reported decreased appetite d/t lack of desire to eat, denied nausea abd pain or taste changes that would contribute to poor PO intake -Diet recall indicates pt consuming two meals daily, usually breakfast of toast and eggs or sausage, and a lunch or dinner of chicken or fish. Consume one Juven supplement daily. Does not consume BID d/t expenses. Recommend pt incorporate high protein snack as Juven alternative to assist in skin integrity -Current PO intake ~50% -Juven ordered BID; pt drinking 100% -Denied changes in weight. Usual body weight 340 lbs. Current weight likely elevated d/t bilateral leg edema -WOC eval pending for right leg cellulitis -Has been educated for weight loss/DM2 diet recommendation by RN during a previous admit -CBG elevated d/t fevers, DM2 -Renal profile elevated d/t CKD; baseline Crt  1.2, currently at 1.65  Height: Ht Readings from Last 1 Encounters:  09/02/14 5\' 6"  (1.676 m)    Weight: Wt Readings from Last 1 Encounters:  09/03/14 357 lb 9.4 oz (162.2 kg)    Ideal Body Weight: 130 lb  % Ideal Body Weight: 274%  Wt Readings from Last 10 Encounters:  09/03/14 357 lb 9.4 oz (162.2 kg)  07/19/14 339 lb 8.1 oz (154 kg)  06/10/14 333 lb (151.048 kg)  06/07/14 340 lb (154.223 kg)  06/02/14 333 lb (151.048 kg)  05/26/14 340 lb (154.223 kg)  05/20/14 340 lb (154.223 kg)  05/18/14 340 lb (154.223 kg)  05/14/14 257 lb 15 oz (117 kg)  05/07/14 318 lb (144.244 kg)    Usual Body Weight: 340 lb  % Usual Body Weight: 105%  BMI:  Body mass index is 57.74 kg/(m^2).  Estimated Nutritional Needs: Kcal: 1750-1950 Protein: ~120 gram Fluid: per MD  Skin: nonpitting bilateral lower extremity edema  Diet Order: Diet heart healthy/carb modified  EDUCATION NEEDS: -Education needs addressed   Intake/Output Summary (Last 24 hours) at 09/03/14 1231 Last data filed at 09/03/14 0600  Gross per 24 hour  Intake    200 ml  Output      0 ml  Net    200 ml    Last BM: PTA WDL   Labs:   Recent Labs Lab 09/02/14 1952 09/03/14 0407  NA 139 133*  K 4.7 4.3  CL 106 106  CO2 22 20  BUN 35* 33*  CREATININE 1.66* 1.65*  CALCIUM 8.6 7.8*  GLUCOSE 166* 185*    CBG (last 3)   Recent Labs  09/03/14  4854 09/03/14 1153  GLUCAP 166* 208*    Scheduled Meds: . allopurinol  100 mg Oral Daily  . amLODipine  10 mg Oral Daily  . cholecalciferol  1,000 Units Oral Daily  . furosemide  40 mg Oral Daily  . gabapentin  300 mg Oral TID  . imipenem-cilastatin  500 mg Intravenous 3 times per day  . insulin aspart  0-9 Units Subcutaneous TID WC  . isosorbide mononitrate  10 mg Oral BID  . metoprolol tartrate  75 mg Oral BID  . nutrition supplement (JUVEN)  1 packet Oral BID  . pantoprazole  40 mg Oral Daily  . polyethylene glycol  17 g Oral Daily  . senna  1 tablet  Oral BID  . sodium chloride  3 mL Intravenous Q12H  . vancomycin  1,500 mg Intravenous Q24H    Continuous Infusions: . heparin 1,850 Units/hr (09/03/14 0144)    Past Medical History  Diagnosis Date  . Hypertension   . Hyperlipidemia   . CHF (congestive heart failure)   . GERD (gastroesophageal reflux disease)   . Depression   . Lymphedema of leg     left leg  . Pulmonary embolism 02/18/14    diagnosed at [facility] with CT angio chest  . Chronic indwelling Foley catheter   . Heart murmur     "just found out I have one" (04/07/2014)  . DVT (deep venous thrombosis) 01/2014    LLE; "went from my leg to my lungs"  . Pneumonia 1990's X 2  . Sleep apnea     doesn't use cpap machine or 02 at night (04/07/2014)  . Anemia   . History of blood transfusion 1990's X 2    "when they did my surgeries"  . Migraines     "none for years now" (04/07/2014)  . Arthritis     "arms, legs" (04/07/2014)  . Gout   . Urinary incontinence   . Chronic kidney disease (CKD), stage III (moderate)     [name] 04/07/2014  . Neuropathy   . Diabetes mellitus     "at one time" (04/07/2014)    Past Surgical History  Procedure Laterality Date  . Shoulder open rotator cuff repair Right ~ 2000  . Knee arthroscopy Right ?1980's  . Temporal artery biopsy / ligation Right ?1990's  . Colonoscopy with propofol  09/23/2012    Procedure: COLONOSCOPY WITH PROPOFOL;  Surgeon: Jerene Bears, MD;  Location: [facility];  Service: Gastroenterology;  Laterality: N/A;  . Panniculectomy Left 02/08/2014    w/wound debridement @ medial thigh  . Irrigation and debridement abscess Left 02/24/2014    Procedure: MINOR INCISION AND DRAINAGE OF ABSCESS;  Surgeon: Theodoro Kos, DO;  Location: [facility];  Service: Plastics;  Laterality: Left;  . I&d extremity Left 03/03/2014    Procedure: IRRIGATION AND DEBRIDEMENT LEFT LEG WOUND AND PLACEMENT OF A CELL AND VAC;  Surgeon: Theodoro Kos, DO;  Location: [place];  Service: Plastics;  Laterality: Left;   . Incision and drainage of wound Left 03/16/2014    Procedure: IRRIGATION AND DEBRIDEMENT OF LEFT THIGH WOUND, APPLICATION ACELL;  Surgeon: Irene Limbo, MD;  Location: [place];  Service: Plastics;  Laterality: Left;  . Incision and drainage of wound Left 03/25/2014    Procedure: IRRIGATION AND DEBRIDEMENT OF LEFT LEG WOUND WITH PLACEMENT OF ACELL/VAC;  Surgeon: Theodoro Kos, DO;  Location: [place];  Service: Plastics;  Laterality: Left;  . Incision and drainage of wound Left 03/31/2014    Procedure:  IRRIGATION AND DEBRIDEMENT LEFT LEG WOUND WITH PLACEMENT OF A-CELL/VAC;  Surgeon: Theodoro Kos, DO;  Location: [employer];  Service: Plastics;  Laterality: Left;  . Cataract extraction w/ intraocular lens implant Right 2014  . Vena cava filter placement  02/2014  . Incision and drainage of wound Left 04/07/2014    Procedure: IRRIGATION AND DEBRIDEMENT LEFT LEG WOUND WITH PLACEMENT OF A CELL AND VAC;  Surgeon: Theodoro Kos, DO;  Location: [place];  Service: Plastics;  Laterality: Left;  . Incision and drainage of wound Left 04/14/2014    Procedure: IRRIGATION AND DEBRIDEMENT WOUND;  Surgeon: Theodoro Kos, DO;  Location: [place];  Service: Plastics;  Laterality: Left;  . Application of a-cell of extremity Left 04/14/2014    Procedure: APPLICATION OF A-CELL OF EXTREMITY;  Surgeon: Theodoro Kos, DO;  Location: [place];  Service: Plastics;  Laterality: Left;  Marland Kitchen Minor application of wound vac Left 04/14/2014    Procedure: MINOR APPLICATION OF WOUND VAC;  Surgeon: Theodoro Kos, DO;  Location: [place];  Service: Plastics;  Laterality: Left;  . Incision and drainage of wound Left 04/21/2014    Procedure: IRRIGATION AND DEBRIDEMENT OF LEFT UPPER LEG WOUND WITH PLACEMENT OF ACELL/VAC;  Surgeon: Theodoro Kos, DO;  Location: [place];  Service: Plastics;  Laterality: Left;    Atlee Abide MS RD [place] Clinical Dietitian [phone]

## 2014-09-03 NOTE — Progress Notes (Signed)
PT Cancellation Note  Patient Details Name: Kelly [name] MRN: [id number] DOB: [birth year]   Cancelled Treatment:    Reason Eval/Treat Not Completed: Pain limiting ability to participate (Pt refused PT 2* BLE pain, RN notified of request for pain medication. Will follow. )   Philomena Doheny 09/03/2014, 10:39 AM  [phone]

## 2014-09-03 NOTE — Progress Notes (Signed)
Patient declines the use of nocturnal CPAP. She states she had tried it at home and has not tolerated it in the past. She refuses to try again at this time.

## 2014-09-03 NOTE — Progress Notes (Signed)
Pt has multiple sores on legs including one displaying cellulitis. Will recommend foley for patient due to bouts of incontinence. Pt's temp is elevated and pt is somewhat slower to respond with mild confusion.

## 2014-09-03 NOTE — Progress Notes (Signed)
Peripherally Inserted Central Catheter/Midline Placement  The IV Nurse has discussed with the patient and/or persons authorized to consent for the patient, the purpose of this procedure and the potential benefits and risks involved with this procedure.  The benefits include less needle sticks, lab draws from the catheter and patient may be discharged home with the catheter.  Risks include, but not limited to, infection, bleeding, blood clot (thrombus formation), and puncture of an artery; nerve damage and irregular heat beat.  Alternatives to this procedure were also discussed.  PICC/Midline Placement Documentation  PICC / Midline Single Lumen 09/03/14 PICC Right Brachial 46 cm 0 cm (Active)  Indication for Insertion or Continuance of Line Poor Vasculature-patient has had multiple peripheral attempts or PIVs lasting less than 24 hours 09/03/2014  6:01 PM  Exposed Catheter (cm) 0 cm 09/03/2014  6:01 PM  Site Assessment Clean;Dry;Intact 09/03/2014  6:01 PM  Line Status Flushed;Saline locked;Blood return noted 09/03/2014  6:01 PM  Dressing Change Due 09/10/14 09/03/2014  6:01 PM       Gordan Payment 09/03/2014, 6:15 PM

## 2014-09-03 NOTE — Progress Notes (Signed)
[place] for Heparin Indication: pulmonary embolus  Allergies  Allergen Reactions  . Bactrim [Sulfamethoxazole-Trimethoprim] Other (See Comments)    Hyperkalemia (July 2015)  . Penicillins Hives    Has taken cephalexin 06/2014    Patient Measurements: Height: 5\' 6"  (167.6 cm) Weight: (!) 357 lb 9.4 oz (162.2 kg) IBW/kg (Calculated) : 59.3 Heparin Dosing Weight:   Vital Signs: Temp: 100.6 F (38.1 C) (01/08 1100) Temp Source: Oral (01/08 1100) BP: 153/53 mmHg (01/08 0551) Pulse Rate: 97 (01/08 0551)  Labs:  Recent Labs  09/02/14 1952 09/03/14 0010 09/03/14 0015 09/03/14 0407 09/03/14 1149  HGB 10.1*  --   --  8.9*  --   HCT 31.9*  --   --  28.5*  --   PLT 238  --   --  PLATELET CLUMPS NOTED ON SMEAR, COUNT APPEARS ADEQUATE  --   APTT  --  35  --   --   --   LABPROT  --   --  14.8  --   --   INR  --   --  1.15  --   --   HEPARINUNFRC  --   --   --   --  0.26*  CREATININE 1.66*  --   --  1.65*  --     Estimated Creatinine Clearance: 52.5 mL/min (by C-G formula based on Cr of 1.65).   Medical History: Past Medical History  Diagnosis Date  . Hypertension   . Hyperlipidemia   . CHF (congestive heart failure)   . GERD (gastroesophageal reflux disease)   . Depression   . Lymphedema of leg     left leg  . Pulmonary embolism 02/18/14    diagnosed at [facility] with CT angio chest  . Chronic indwelling Foley catheter   . Heart murmur     "just found out I have one" (04/07/2014)  . DVT (deep venous thrombosis) 01/2014    LLE; "went from my leg to my lungs"  . Pneumonia 1990's X 2  . Sleep apnea     doesn't use cpap machine or 02 at night (04/07/2014)  . Anemia   . History of blood transfusion 1990's X 2    "when they did my surgeries"  . Migraines     "none for years now" (04/07/2014)  . Arthritis     "arms, legs" (04/07/2014)  . Gout   . Urinary incontinence   . Chronic kidney disease (CKD), stage III (moderate)     [name]  04/07/2014  . Neuropathy   . Diabetes mellitus     "at one time" (04/07/2014)    Medications:  Prescriptions prior to admission  Medication Sig Dispense Refill Last Dose  . acetaminophen (TYLENOL) 325 MG tablet Take 650 mg by mouth every 6 (six) hours as needed for fever.   Past Month at Unknown time  . allopurinol (ZYLOPRIM) 100 MG tablet Take 100 mg by mouth daily.    09/02/2014 at Unknown time  . cholecalciferol (VITAMIN D) 1000 UNITS tablet Take 1,000 Units by mouth daily.   09/02/2014 at Unknown time  . esomeprazole (NEXIUM) 40 MG capsule Take 40 mg by mouth daily before breakfast. Take on an empty stomach   09/02/2014 at Unknown time  . furosemide (LASIX) 40 MG tablet Take 40 mg by mouth 2 (two) times daily.    09/02/2014 at Unknown time  . gabapentin (NEURONTIN) 300 MG capsule Take 300 mg by mouth 3 (three) times daily.  09/02/2014 at Unknown time  . isosorbide mononitrate (ISMO,MONOKET) 10 MG tablet Take 10 mg by mouth 2 (two) times daily.   09/02/2014 at Unknown time  . metoprolol tartrate (LOPRESSOR) 25 MG tablet Take 3 tablets (75 mg total) by mouth 2 (two) times daily. 180 tablet 0 09/02/2014 at 1000  . Nutritional Supplements (JUVEN NUTRIVIGOR) PACK Take 1 Container by mouth 2 (two) times daily.   Past Week at Unknown time  . Oxycodone HCl 10 MG TABS Take 1 tablet (10 mg total) by mouth every 4 (four) hours as needed (moderate pain). 180 tablet 0 09/02/2014 at Unknown time  . polyethylene glycol (MIRALAX / GLYCOLAX) packet Take 17 g by mouth daily. Mix with 4-6 oz liquid   unknown at unknown  . promethazine (PHENERGAN) 25 MG tablet Take 25 mg by mouth every 6 (six) hours as needed for nausea or vomiting.   Past Week at Unknown time  . senna (SENOKOT) 8.6 MG TABS tablet Take 1 tablet (8.6 mg total) by mouth 2 (two) times daily. 120 each 0 09/02/2014 at Unknown time  . zolpidem (AMBIEN) 5 MG tablet Take 1 tablet (5 mg total) by mouth at bedtime as needed for sleep. 30 tablet 5 Past Week at Unknown time   . apixaban (ELIQUIS) 5 MG TABS tablet Take 1 tablet (5 mg total) by mouth 2 (two) times daily. (Patient not taking: Reported on 09/02/2014) 60 tablet 0 Not Taking at Unknown time  . cephALEXin (KEFLEX) 500 MG capsule Take 1 capsule (500 mg total) by mouth 4 (four) times daily. 20 capsule 0    Infusions:  . heparin 1,850 Units/hr (09/03/14 0144)    Assessment: [name] with Hx PE/DVT in June 2015; was on Eliquis but stopped taking 07/19/14.  Started on heparin per pharmacy after admission.  Appears to have had IVC filter placed shortly after clot found.  No cardiology notes available in medical record  Baseline labs WNL   Goal of Therapy:  Heparin level 0.3-0.7 units/ml Monitor platelets by anticoagulation protocol: Yes   Plan:  Discussed with MD, who feels stopping treatment in November (5 months) is a reasonable duration for treatment of new DVT/PE.  Will not continue any further anticoagulation at this time.   Reuel Boom, PharmD Pager: [phone] 09/03/2014, 12:39 PM

## 2014-09-03 NOTE — Progress Notes (Signed)
OT Cancellation Note  Patient Details Name: Kelly [name] MRN: [id number] DOB: 1946-06-15   Cancelled Treatment:    Reason Eval/Treat Not Completed: Other (comment). Pt is having a hard time staying awake:  She states that she doesn't know if she can sit EOB as pain in back of legs is bad.  Will check tomorrow if schedule permits.  Janelle Spellman 09/03/2014, 2:07 PM  Lesle Chris, OTR/L [phone] 09/03/2014

## 2014-09-03 NOTE — Progress Notes (Signed)
Pt has had mild fevers of 101 throughout the day with no response to Tylenol. MD made aware, increase Tylenol dose. Will continue to monitor. Callie Fielding RN

## 2014-09-03 NOTE — Consult Note (Signed)
WOC wound consult note Reason for Consult:Cellulitis and open wounds Wound type:venous insufficiency vs lymphedema Pressure Ulcer POA: No Measurement: left LE 16cm x 2cm x 0.2cm.  Right LE:  Affected area measures 15cm x 10cm with reabsorbing blisters.  The residual blistered area of stretched epithelial tissue remains white/grey, but no longer contains fluid. Wound NWG:NFAOZ, pink, moist (but not wet) Drainage (amount, consistency, odor) scant amount of serous drainage at this time.  Patient has been in bed with LEs elevated and open to air e extremities have been open to air. Periwound:intact, dry Dressing procedure/placement/frequency: I will implement a daily dressing change after wound cleansing with xeroform gauze in a single layer as it will donate an antiseptic property and keep wounds therapeutically moist for contraction and granulation. [facility] nursing team will not follow, but will remain available to this patient, the nursing and medical team.  Please re-consult if needed. Thanks, Maudie Flakes, MSN, RN, [place], [place], [place] [phone])

## 2014-09-03 NOTE — Progress Notes (Signed)
OT Cancellation Note  Patient Details Name: Kelly [name] MRN: [id number] DOB: [birth year]   Cancelled Treatment:    Reason Eval/Treat Not Completed: Other (comment).  Noted pt refused PT due to pain approximately 20 minutes ago.  Will check back after pt has pain medication.  Marlea Gambill 09/03/2014, 11:00 AM  Lesle Chris, OTR/L [phone] 09/03/2014

## 2014-09-04 DIAGNOSIS — R609 Edema, unspecified: Secondary | ICD-10-CM

## 2014-09-04 LAB — CREATININE, SERUM
Creatinine, Ser: 1.71 mg/dL — ABNORMAL HIGH (ref 0.50–1.10)
GFR calc Af Amer: 35 mL/min — ABNORMAL LOW (ref >90)
GFR calc non Af Amer: 30 mL/min — ABNORMAL LOW (ref >90)

## 2014-09-04 LAB — URINE CULTURE: Colony Count: 1000

## 2014-09-04 LAB — GLUCOSE, CAPILLARY
GLUCOSE-CAPILLARY: 126 mg/dL — AB (ref 70–99)
GLUCOSE-CAPILLARY: 167 mg/dL — AB (ref 70–99)
Glucose-Capillary: 168 mg/dL — ABNORMAL HIGH (ref 70–99)
Glucose-Capillary: 125 mg/dL — ABNORMAL HIGH (ref 70–99)

## 2014-09-04 LAB — CBC
HEMATOCRIT: 26.7 % — AB (ref 36.0–46.0)
Hemoglobin: 8.2 g/dL — ABNORMAL LOW (ref 12.0–15.0)
MCH: 27.4 pg (ref 26.0–34.0)
MCHC: 30.7 g/dL (ref 30.0–36.0)
MCV: 89.3 fL (ref 78.0–100.0)
PLATELETS: 193 10*3/uL (ref 150–400)
RBC: 2.99 MIL/uL — ABNORMAL LOW (ref 3.87–5.11)
RDW: 16.8 % — AB (ref 11.5–15.5)
WBC: 13 10*3/uL — ABNORMAL HIGH (ref 4.0–10.5)

## 2014-09-04 LAB — BASIC METABOLIC PANEL
ANION GAP: 9 (ref 5–15)
BUN: 38 mg/dL — AB (ref 6–23)
CO2: 25 mmol/L (ref 19–32)
CREATININE: 1.71 mg/dL — AB (ref 0.50–1.10)
Calcium: 8.1 mg/dL — ABNORMAL LOW (ref 8.4–10.5)
Chloride: 105 mEq/L (ref 96–112)
GFR calc Af Amer: 35 mL/min — ABNORMAL LOW (ref >90)
GFR calc non Af Amer: 30 mL/min — ABNORMAL LOW (ref >90)
GLUCOSE: 131 mg/dL — AB (ref 70–99)
Potassium: 4.4 mmol/L (ref 3.5–5.1)
SODIUM: 139 mmol/L (ref 135–145)

## 2014-09-04 MED ORDER — SODIUM CHLORIDE 0.9 % IV SOLN
250.0000 mg | Freq: Four times a day (QID) | INTRAVENOUS | Status: DC
  Administered 2014-09-04 – 2014-09-06 (×9): 250 mg via INTRAVENOUS
  Filled 2014-09-04 (×10): qty 250

## 2014-09-04 MED ORDER — HEPARIN SODIUM (PORCINE) 5000 UNIT/ML IJ SOLN
5000.0000 [IU] | Freq: Three times a day (TID) | INTRAMUSCULAR | Status: DC
  Administered 2014-09-04 – 2014-09-09 (×15): 5000 [IU] via SUBCUTANEOUS
  Filled 2014-09-04 (×18): qty 1

## 2014-09-04 NOTE — Evaluation (Signed)
Physical Therapy Evaluation Patient Details Name: Kelly [name] MRN: [id number] DOB: 02/19/1946 Today's Date: 09/04/2014   History of Present Illness  28-yo female admitted  09/02/13  bil LE pain/cellulitis.   PMHx:  chronic diastolic heart failure, hypertension, hyperlipidemia, diabetes mellitus type 2, obstructive sleep apnea, GERD, depression, chronic lymphedema, and indwelling Foley catheter. Recently had I/D  L thigh wound  in 6/15 with multiple complications  including PE. Patient 's leg wound being managed by Wound center.  Clinical Impression  Pt admitted with above diagnosis. Pt currently with functional limitations due to the deficits listed below (see PT Problem List).  Pt will benefit from skilled PT to increase their independence and safety with mobility to allow discharge to the venue listed below.   Will continue to follow while in acute setting, pt will need to resume HHPT at D/C.     Follow Up Recommendations Home health PT    Equipment Recommendations  None recommended by PT    Recommendations for Other Services       Precautions / Restrictions Precautions Precautions: Fall      Mobility  Bed Mobility Overal bed mobility: Needs Assistance Bed Mobility: Supine to Sit;Sit to Supine     Supine to sit: HOB elevated;Mod assist Sit to supine: +2 for physical assistance   General bed mobility comments: assist of 1 for LEs to come to sit; +2 to bring LEs onto bed d/t pt body habitus  Transfers Overall transfer level: Needs assistance Equipment used: Rolling walker (2 wheeled) Transfers: Sit to/from Stand Sit to Stand: +2 physical assistance;+2 safety/equipment;Min assist         General transfer comment: incr time, multi-modal cues for hip/trunk extension  Ambulation/Gait Ambulation/Gait assistance: Min guard Ambulation Distance (Feet): 20 Feet (in room) Assistive device: Rolling walker (2 wheeled) Gait Pattern/deviations: Step-through pattern;Decreased  stride length;Wide base of support;Trunk flexed     General Gait Details: min/guard for balance and safety,   Stairs            Wheelchair Mobility    Modified Rankin (Stroke Patients Only)       Balance Overall balance assessment: Needs assistance           Standing balance-Leahy Scale: Poor Standing balance comment: requires support of RW to stand, able to stand for 3.42min prior to ambulating                             Pertinent Vitals/Pain Pain Assessment: 0-10 Pain Score: 6  Pain Location: LEs  Pain Intervention(s): Limited activity within patient's tolerance;Monitored during session;Repositioned    Home Living Family/patient expects to be discharged to:: Private residence Living Arrangements: Other relatives (nephew) Available Help at Discharge: Family Type of Home: Apartment Home Access: Level entry     Home Layout: One level Home Equipment: Hospital bed;Walker - 2 wheels Additional Comments: getting HHPT prior to adm ; amb with RW in house daily--multiple short distances    Prior Function Level of Independence: Needs assistance   Gait / Transfers Assistance Needed: amb with RW prior to adm, househild distances, does not "go out much"           Hand Dominance        Extremity/Trunk Assessment   Upper Extremity Assessment: Defer to OT evaluation           Lower Extremity Assessment: Generalized weakness         Communication   Communication:  No difficulties  Cognition Arousal/Alertness: Awake/alert Behavior During Therapy: WFL for tasks assessed/performed Overall Cognitive Status: Within Functional Limits for tasks assessed                      General Comments General comments (skin integrity, edema, etc.): HR 78-88, O2 sats 97-100%; mild DOE    Exercises        Assessment/Plan    PT Assessment Patient needs continued PT services  PT Diagnosis Difficulty walking   PT Problem List Decreased  strength;Decreased activity tolerance;Decreased mobility;Obesity  PT Treatment Interventions DME instruction;Gait training;Functional mobility training;Therapeutic activities;Therapeutic exercise;Patient/family education   PT Goals (Current goals can be found in the Care Plan section) Acute Rehab PT Goals Patient Stated Goal: to get back home, wants to be as I as possible PT Goal Formulation: With patient Time For Goal Achievement: 09/18/14 Potential to Achieve Goals: Good    Frequency Min 3X/week   Barriers to discharge        Co-evaluation    partial co-eval for pt safety to address mobility           End of Session   Activity Tolerance: Patient tolerated treatment well Patient left: in bed;with call bell/phone within reach;with bed alarm set Nurse Communication: Mobility status         Time: 7124-5809 PT Time Calculation (min) (ACUTE ONLY): 34 min   Charges:   PT Evaluation $Initial PT Evaluation Tier I: 1 Procedure PT Treatments $Gait Training: 8-22 mins   PT G CodesKenyon Ana 09/15/14, 5:22 PM

## 2014-09-04 NOTE — Progress Notes (Signed)
*  Preliminary Results* Bilateral lower extremity venous duplex completed. Study was very technically limited/difficult due to patient body habitus, depth of vessels, and lower extremity lymphedema. Visualized veins of bilateral lower extremities are negative for deep vein thrombosis. There is no evidence of Baker's cyst bilaterally. No change from previous exams within the past year dated 05/11/14, 04/09/14, 11/18/12.  09/04/2014  Maudry Mayhew, RVT, RDCS, RDMS

## 2014-09-04 NOTE — Progress Notes (Signed)
ANTIBIOTIC CONSULT NOTE - FOLLOW UP  Pharmacy Consult for Vancomycin, Primaxin Indication: Cellulitis, r/o Sepsis  Allergies  Allergen Reactions  . Bactrim [Sulfamethoxazole-Trimethoprim] Other (See Comments)    Hyperkalemia (July 2015)  . Penicillins Hives    Has taken cephalexin 06/2014    Patient Measurements: Height: 5\' 6"  (167.6 cm) Weight: (!) 359 lb 5.6 oz (163 kg) IBW/kg (Calculated) : 59.3  Vital Signs: Temp: 98.9 F (37.2 C) (01/09 0458) Temp Source: Oral (01/09 0458) BP: 151/63 mmHg (01/09 0458) Pulse Rate: 77 (01/09 0458) Intake/Output from previous day: 01/08 0701 - 01/09 0700 In: 1143.8 [P.O.:240; I.V.:203.8; IV Piggyback:700] Out: 1400 [Urine:1400]  Labs:  Recent Labs  09/02/14 1952 09/03/14 0407 09/03/14 1120 09/04/14 0410  WBC 12.7* 14.8*  --  13.0*  HGB 10.1* 8.9*  --  8.2*  PLT 238 PLATELET CLUMPS NOTED ON SMEAR, COUNT APPEARS ADEQUATE  --  193  LABCREA  --   --  84.72  --   CREATININE 1.66* 1.65*  --  1.71*   Estimated Creatinine Clearance: 50.8 mL/min (by C-G formula based on Cr of 1.71). No results for input(s): VANCOTROUGH, VANCOPEAK, VANCORANDOM, GENTTROUGH, GENTPEAK, GENTRANDOM, TOBRATROUGH, TOBRAPEAK, TOBRARND, AMIKACINPEAK, AMIKACINTROU, AMIKACIN in the last 72 hours.    Assessment: 69 yo morbidly obese female brought from home by EMS with cellulitis and open sore on right leg with increased swelling and pitting edema, malodorous urination. Temp 103.4 and chills upon arrival. Patient has history of chronic leg ulcer s/p wound VAC and recurrent hip and thigh cellulitis with hx group C strep infection and sepsis.  Pharmacy is consulted to dose Vancomycin and Primaxin.  1/7 >> Vanc >> 1/8 >> Primaxin >>  Micro: 1/7 blood x 2: 1/2 GPC chains 1/8 MRSA pca: positive 1/8 urine: IP  Today, 09/04/2014 :   Tmax: 101.8 (improved)  WBC: elevated but improved, 13  Renal: SCr elevated and increased (1.65 > 1.71). Hx CKD w/ baseline ~1.2.  CrCl 36N, 51 CG  Lactate: 2.03   Goal of Therapy:  Vancomycin trough level 15-20 mcg/ml  Plan:   Decrease to Primaxin 250mg  q6h  Continue Vancomycin 1500 mg IV q24h.  Measure Vanc trough tonight before next dose d/t rising SCr.  Follow up renal fxn and blood culture results.   Gretta Arab PharmD, BCPS Pager [phone] 09/04/2014 10:13 AM

## 2014-09-04 NOTE — H&P (Deleted)
[facility] PROGRESS NOTE   Kelly [name] WEX:[id number] DOB: 1946/02/05 DOA: 09/02/2014 PCP: Alvester Chou, NP  HPI/Subjective: Feels better than yesterday, afebrile this morning.  Assessment/Plan: Principal Problem:   Cellulitis, leg Active Problems:   Essential hypertension   Diabetes mellitus type 2 with neurological manifestations   Acute on chronic renal failure   Morbid obesity   Pulmonary embolism   Anemia of chronic renal failure, stage 3 (moderate)   Diabetic neuropathy   Gout   DM type 2 causing CKD stage 3   Coronary atherosclerosis of native coronary artery   Sepsis   DVT (deep venous thrombosis)    Cellulitis of the lower extremities, severe non-purulent Bilateral low-density cellulitis, patient has chronic lymphedema and recurrent cellulitis bilaterally. Had previous sepsis from cellulitis. Blood cultures obtained. Started on vancomycin and Primaxin, continued. Obtain bilateral lower extremity duplex to rule out DVT  Sepsis Presented with temp of 102 and heart rate of 118 the presence of cellulitis. Started on broad-spectrum antibiotics, namely vancomycin and Primaxin. No aggressive IV fluid hydration because of lower extremity edema and concerns of fluid overload.  Diabetes mellitus type 2 Insulin sliding scale, check hemoglobin A1c. Carbohydrate modified diet.  CKD stage III Likely secondary to diabetic nephropathy and history of hypertension. Baseline creatinine about 1.3, presented with creatinine of 1.6. Has lower extremity edema elevate lower extremities and continue Lasix.   History of PE/DVT Has history of PE/DVT reportedly last year, she was been on Eliquis for about 5 months. Last lower extremity Doppler done on 05/11/2014 and showed no evidence of DVT involving the lower extremities. Patient was on heparin drip, I do not see any indication to continue therapeutic heparin, discontinue heparin drip.   Code Status: Full code Family  Communication: Plan discussed with the patient. Disposition Plan: Remains inpatient   Consultants:  None  Procedures:  None  Antibiotics:  Vancomycin and Primaxin   Objective: Filed Vitals:   09/04/14 1046  BP: 128/57  Pulse: 76  Temp: 98.7 F (37.1 C)  Resp: 16    Intake/Output Summary (Last 24 hours) at 09/04/14 1209 Last data filed at 09/04/14 1052  Gross per 24 hour  Intake 1263.81 ml  Output   1400 ml  Net -136.19 ml   Filed Weights   09/02/14 2300 09/03/14 0500 09/04/14 0616  Weight: 161.8 kg (356 lb 11.3 oz) 162.2 kg (357 lb 9.4 oz) 163 kg (359 lb 5.6 oz)    Exam: General: Alert and awake, oriented x3, not in any acute distress. HEENT: anicteric sclera, pupils reactive to light and accommodation, EOMI CVS: S1-S2 clear, no murmur rubs or gallops Chest: clear to auscultation bilaterally, no wheezing, rales or rhonchi Abdomen: soft nontender, nondistended, normal bowel sounds, no organomegaly Extremities: no cyanosis, clubbing or edema noted bilaterally Neuro: Cranial nerves II-XII intact, no focal neurological deficits  Data Reviewed: Basic Metabolic Panel:  Recent Labs Lab 09/02/14 1952 09/03/14 0407 09/04/14 0410  NA 139 133* 139  K 4.7 4.3 4.4  CL 106 106 105  CO2 22 20 25   GLUCOSE 166* 185* 131*  BUN 35* 33* 38*  CREATININE 1.66* 1.65* 1.71*  CALCIUM 8.6 7.8* 8.1*   Liver Function Tests:  Recent Labs Lab 09/03/14 0407  AST 22  ALT 14  ALKPHOS 103  BILITOT 0.6  PROT 7.5  ALBUMIN 2.6*   No results for input(s): LIPASE, AMYLASE in the last 168 hours. No results for input(s): AMMONIA in the last 168 hours. CBC:  Recent Labs Lab  09/02/14 1952 09/03/14 0407 09/04/14 0410  WBC 12.7* 14.8* 13.0*  NEUTROABS 11.6*  --   --   HGB 10.1* 8.9* 8.2*  HCT 31.9* 28.5* 26.7*  MCV 90.4 89.3 89.3  PLT 238 PLATELET CLUMPS NOTED ON SMEAR, COUNT APPEARS ADEQUATE 193   Cardiac Enzymes: No results for input(s): CKTOTAL, CKMB, CKMBINDEX,  TROPONINI in the last 168 hours. BNP (last 3 results)  Recent Labs  07/11/14 1959  PROBNP 2482.0*   CBG:  Recent Labs Lab 09/03/14 1153 09/03/14 1653 09/03/14 2124 09/04/14 0752 09/04/14 1137  GLUCAP 208* 141* 134* 126* 168*    Micro Recent Results (from the past 240 hour(s))  Culture, blood (routine x 2)     Status: None (Preliminary result)   Collection Time: 09/02/14  7:52 PM  Result Value Ref Range Status   Specimen Description BLOOD RIGHT HAND  Final   Special Requests BOTTLES DRAWN AEROBIC AND ANAEROBIC 5ML  Final   Culture   Final    STREPTOCOCCUS GROUP C Note: Gram Stain Report Called to,Read Back By and Verified With: Lethea Killings RN 76P Performed at [facility]    Report Status PENDING  Incomplete  MRSA PCR Screening     Status: Abnormal   Collection Time: 09/03/14  2:25 AM  Result Value Ref Range Status   MRSA by PCR POSITIVE (A) NEGATIVE Final    Comment:        The GeneXpert MRSA Assay (FDA approved for NASAL specimens only), is one component of a comprehensive MRSA colonization surveillance program. It is not intended to diagnose MRSA infection nor to guide or monitor treatment for MRSA infections. RESULT CALLED TO, READ BACK BY AND VERIFIED WITH: Donne Anon RN AT [phone] ON 01.08.16 BY SHUEA      Studies: US Renal  09/02/2014   CLINICAL DATA:  Acute kidney injury.  EXAM: RENAL/URINARY TRACT ULTRASOUND COMPLETE  COMPARISON:  None.  FINDINGS: Technically limited exam due to body habitus.  Right Kidney:  Length: 12.8 Cm. Echogenicity within normal limits. No mass or hydronephrosis visualized.  Left Kidney:  Length: 12.2 cm. Echogenicity within normal limits. No mass or hydronephrosis visualized.  Bladder:  Appears normal for degree of bladder distention. Both ureteral jets are seen.  IMPRESSION: No hydronephrosis or obstructive uropathy.   Electronically Signed   By: Jeb Levering M.D.   On: 09/02/2014 23:58    Scheduled Meds: .  allopurinol  100 mg Oral Daily  . amLODipine  10 mg Oral Daily  . cholecalciferol  1,000 Units Oral Daily  . furosemide  40 mg Intravenous BID  . gabapentin  300 mg Oral TID  . imipenem-cilastatin  250 mg Intravenous Q6H  . insulin aspart  0-9 Units Subcutaneous TID WC  . isosorbide mononitrate  10 mg Oral BID  . metoprolol tartrate  75 mg Oral BID  . nutrition supplement (JUVEN)  1 packet Oral BID  . pantoprazole  40 mg Oral Daily  . polyethylene glycol  17 g Oral Daily  . senna  1 tablet Oral BID  . sodium chloride  10-40 mL Intracatheter Q12H  . sodium chloride  3 mL Intravenous Q12H  . vancomycin  1,500 mg Intravenous Q24H   Continuous Infusions:      Time spent: 35 minutes    [facility] A  [facility] Pager [phone] If 7PM-7AM, please contact night-coverage at [email or web address], password [facility] 09/04/2014, 12:09 PM  LOS: 2 days

## 2014-09-04 NOTE — Progress Notes (Signed)
Pt refuses CPAP  RT to monitor and assess as needed.  

## 2014-09-04 NOTE — Evaluation (Signed)
Occupational Therapy Evaluation Patient Details Name: Kelly [name] MRN: [id number] DOB: October 19, 1945 Today's Date: 09/04/2014    History of Present Illness 89-yo female admitted  09/02/13  bil LE pain/cellulitis.   PMHx:  chronic diastolic heart failure, hypertension, hyperlipidemia, diabetes mellitus type 2, obstructive sleep apnea, GERD, depression, chronic lymphedema, and indwelling Foley catheter. Recently had I/D  L thigh wound  in 6/15 with multiple complications  including PE. Patient 's leg wound being managed by Wound center.   Clinical Impression   Pt admitted with above. She demonstrates the below listed deficits and will benefit from continued OT to maximize safety and independence with BADLs.  Pt presents to OT with generalized weakness and pain.  She currently requires mod A for BADLs, and min A +2 for functional mobility.   She has intermittent assist at home.  Recommend HHOT at discharge.       Follow Up Recommendations  Home health OT;Supervision/Assistance - 24 hour    Equipment Recommendations  None recommended by OT    Recommendations for Other Services       Precautions / Restrictions Precautions Precautions: Fall      Mobility Bed Mobility Overal bed mobility: Needs Assistance Bed Mobility: Sit to Supine     Supine to sit: HOB elevated;Mod assist Sit to supine: Mod assist;+2 for physical assistance   General bed mobility comments: assist +2 to lift LEs onto bed due to body habitus  Transfers Overall transfer level: Needs assistance Equipment used: Rolling walker (2 wheeled) Transfers: Sit to/from [facility] Sit to Stand: Min assist;+2 physical assistance;+2 safety/equipment Stand pivot transfers: Min assist;+2 safety/equipment       General transfer comment: incr time, multi-modal cues for hip/trunk extension    Balance Overall balance assessment: Needs assistance Sitting-balance support: Feet supported Sitting balance-Leahy  Scale: Good       Standing balance-Leahy Scale: Poor Standing balance comment: requries UE support with RW.  Able to stand 3-5 mins statically prior to ambulating                             ADL Overall ADL's : Needs assistance/impaired Eating/Feeding: Independent;Sitting   Grooming: Minimal assistance;Standing   Upper Body Bathing: Set up;Supervision/ safety;Sitting   Lower Body Bathing: Moderate assistance;Sit to/from stand   Upper Body Dressing : Set up;Sitting   Lower Body Dressing: Moderate assistance;Sit to/from stand   Toilet Transfer: Minimal assistance;+2 for safety/equipment;Ambulation;Comfort height toilet;BSC;RW   Toileting- Clothing Manipulation and Hygiene: Minimal assistance;Sit to/from stand       Functional mobility during ADLs: Minimal assistance;+2 for safety/equipment;Rolling walker General ADL Comments: requires +2 due to size and is unsteady     Vision                     Perception     Praxis      Pertinent Vitals/Pain Pain Assessment: 0-10 Pain Score: 6  Pain Location: LEs Pain Descriptors / Indicators: Aching;Constant Pain Intervention(s): Limited activity within patient's tolerance;Repositioned;Monitored during session     Hand Dominance     Extremity/Trunk Assessment Upper Extremity Assessment Upper Extremity Assessment: Generalized weakness   Lower Extremity Assessment Lower Extremity Assessment: Defer to PT evaluation   Cervical / Trunk Assessment Cervical / Trunk Assessment: Normal   Communication Communication Communication: No difficulties   Cognition Arousal/Alertness: Awake/alert Behavior During Therapy: WFL for tasks assessed/performed Overall Cognitive Status: Within Functional Limits for tasks assessed  General Comments       Exercises       Shoulder Instructions      Home Living Family/patient expects to be discharged to:: Private residence Living  Arrangements: Other relatives (nephew) Available Help at Discharge: Family;Available PRN/intermittently Type of Home: Apartment Home Access: Level entry     Home Layout: One level     Bathroom Shower/Tub: Occupational psychologist: Handicapped height Bathroom Accessibility: Yes How Accessible: Accessible via walker Home Equipment: Hospital bed;Walker - 2 wheels;Shower seat - built in;Bedside commode;Adaptive equipment Adaptive Equipment: Reacher;Sock aid Additional Comments: getting HHPT prior to adm ; amb with RW in house daily--multiple short distances      Prior Functioning/Environment Level of Independence: Needs assistance  Gait / Transfers Assistance Needed: amb with RW prior to adm, househild distances, does not "go out much" ADL's / Homemaking Assistance Needed: Pt reports she does her own BADLs.  She uses AE when needed.  She sometimes sits to perform cooking and cleaning tasks        OT Diagnosis: Generalized weakness;Acute pain   OT Problem List: Decreased strength;Decreased activity tolerance;Impaired balance (sitting and/or standing);Decreased safety awareness;Decreased knowledge of use of DME or AE;Obesity;Pain   OT Treatment/Interventions: Self-care/ADL training;DME and/or AE instruction;Therapeutic activities;Patient/family education;Balance training    OT Goals(Current goals can be found in the care plan section) Acute Rehab OT Goals Patient Stated Goal: to get back home, wants to be as I as possible OT Goal Formulation: With patient Time For Goal Achievement: 09/18/14 Potential to Achieve Goals: Good ADL Goals Pt Will Perform Grooming: with modified independence;standing Pt Will Perform Lower Body Bathing: with set-up;with adaptive equipment;sit to/from stand Pt Will Perform Lower Body Dressing: with set-up;with adaptive equipment;sit to/from stand Pt Will Transfer to Toilet: ambulating;regular height toilet;bedside commode;grab bars;with  supervision Pt Will Perform Toileting - Clothing Manipulation and hygiene: with supervision;sit to/from stand Pt Will Perform Tub/Shower Transfer: Shower transfer;with supervision;ambulating;rolling walker;shower seat  OT Frequency: Min 2X/week   Barriers to D/C: Decreased caregiver support          Co-evaluation PT/OT/SLP Co-Evaluation/Treatment: Yes Reason for Co-Treatment: For patient/therapist safety PT goals addressed during session: Mobility/safety with mobility OT goals addressed during session: ADL's and self-care      End of Session    Activity Tolerance:   Patient left:     Time: 3748-2707 OT Time Calculation (min): 18 min Charges:  OT General Charges $OT Visit: 1 Procedure OT Evaluation $Initial OT Evaluation Tier I: 1 Procedure OT Treatments $Therapeutic Activity: 8-22 mins G-Codes:    Deola Rewis M September 10, 2014, 5:43 PM

## 2014-09-05 DIAGNOSIS — L03119 Cellulitis of unspecified part of limb: Secondary | ICD-10-CM

## 2014-09-05 DIAGNOSIS — R7881 Bacteremia: Secondary | ICD-10-CM

## 2014-09-05 DIAGNOSIS — B954 Other streptococcus as the cause of diseases classified elsewhere: Secondary | ICD-10-CM

## 2014-09-05 LAB — BASIC METABOLIC PANEL
ANION GAP: 3 — AB (ref 5–15)
BUN: 42 mg/dL — ABNORMAL HIGH (ref 6–23)
CO2: 25 mmol/L (ref 19–32)
Calcium: 7.8 mg/dL — ABNORMAL LOW (ref 8.4–10.5)
Chloride: 107 mEq/L (ref 96–112)
Creatinine, Ser: 1.57 mg/dL — ABNORMAL HIGH (ref 0.50–1.10)
GFR calc Af Amer: 38 mL/min — ABNORMAL LOW (ref >90)
GFR, EST NON AFRICAN AMERICAN: 33 mL/min — AB (ref >90)
Glucose, Bld: 136 mg/dL — ABNORMAL HIGH (ref 70–99)
Potassium: 4.1 mmol/L (ref 3.5–5.1)
Sodium: 135 mmol/L (ref 135–145)

## 2014-09-05 LAB — CBC
HCT: 25.3 % — ABNORMAL LOW (ref 36.0–46.0)
Hemoglobin: 8.1 g/dL — ABNORMAL LOW (ref 12.0–15.0)
MCH: 28.6 pg (ref 26.0–34.0)
MCHC: 32 g/dL (ref 30.0–36.0)
MCV: 89.4 fL (ref 78.0–100.0)
Platelets: 196 10*3/uL (ref 150–400)
RBC: 2.83 MIL/uL — ABNORMAL LOW (ref 3.87–5.11)
RDW: 16.3 % — ABNORMAL HIGH (ref 11.5–15.5)
WBC: 10.5 10*3/uL (ref 4.0–10.5)

## 2014-09-05 LAB — CULTURE, BLOOD (ROUTINE X 2)

## 2014-09-05 LAB — GLUCOSE, CAPILLARY
GLUCOSE-CAPILLARY: 153 mg/dL — AB (ref 70–99)
GLUCOSE-CAPILLARY: 135 mg/dL — AB (ref 70–99)
GLUCOSE-CAPILLARY: 153 mg/dL — AB (ref 70–99)
Glucose-Capillary: 119 mg/dL — ABNORMAL HIGH (ref 70–99)

## 2014-09-05 MED ORDER — MUPIROCIN 2 % EX OINT
1.0000 "application " | TOPICAL_OINTMENT | Freq: Two times a day (BID) | CUTANEOUS | Status: DC
  Administered 2014-09-05 – 2014-09-09 (×8): 1 via NASAL
  Filled 2014-09-05 (×3): qty 22

## 2014-09-05 MED ORDER — CHLORHEXIDINE GLUCONATE CLOTH 2 % EX PADS
6.0000 | MEDICATED_PAD | Freq: Every day | CUTANEOUS | Status: DC
  Administered 2014-09-06 – 2014-09-09 (×4): 6 via TOPICAL

## 2014-09-05 NOTE — Progress Notes (Signed)
[facility] PROGRESS NOTE   Kelly [name] KHT:[id number] DOB: [birth year] DOA: 09/02/2014 PCP: Alvester Chou, NP  HPI/Subjective: Feels much better, blood cultures positive for Streptococcus group C.  Assessment/Plan: Principal Problem:   Cellulitis, leg Active Problems:   Essential hypertension   Diabetes mellitus type 2 with neurological manifestations   Acute on chronic renal failure   Morbid obesity   Pulmonary embolism   Anemia of chronic renal failure, stage 3 (moderate)   Diabetic neuropathy   Gout   DM type 2 causing CKD stage 3   Coronary atherosclerosis of native coronary artery   Sepsis   DVT (deep venous thrombosis)    Streptococcus group C bacteremia Blood culture grew Streptococcus group C, patient is allergic to penicillin currently on Primaxin. Patient has right-sided PICC line placed for difficult IV access, placed on 09/03/14. I have asked Dr. Megan Salon to recommend antibiotics.  Cellulitis of the lower extremities, severe non-purulent Bilateral low-density cellulitis, patient has chronic lymphedema and recurrent cellulitis bilaterally. Had previous sepsis from cellulitis. Blood cultures obtained. Started on vancomycin and Primaxin, continued. Primaxin continued and vancomycin discontinued.  Sepsis Presented with temp of 102 and heart rate of 118 the presence of cellulitis. Started on broad-spectrum antibiotics, namely vancomycin and Primaxin. No aggressive IV fluid hydration because of lower extremity edema and concerns of fluid overload.  Diabetes mellitus type 2 Insulin sliding scale, check hemoglobin A1c. Carbohydrate modified diet.  CKD stage III Likely secondary to diabetic nephropathy and history of hypertension. Baseline creatinine about 1.3, presented with creatinine of 1.6. Has lower extremity edema elevate lower extremities and continue Lasix.   History of PE/DVT Has history of PE/DVT reportedly last year, she was been on Eliquis  for about 5 months. Last lower extremity Doppler done on 05/11/2014 and showed no evidence of DVT involving the lower extremities. Patient was on heparin drip, I do not see any indication to continue therapeutic heparin, discontinue heparin drip.   Code Status: Full code Family Communication: Plan discussed with the patient. Disposition Plan: Remains inpatient   Consultants:  None  Procedures:  None  Antibiotics:  Vancomycin and Primaxin   Objective: Filed Vitals:   09/05/14 1035  BP: 139/60  Pulse: 74  Temp:   Resp:     Intake/Output Summary (Last 24 hours) at 09/05/14 1106 Last data filed at 09/05/14 1045  Gross per 24 hour  Intake    600 ml  Output   3850 ml  Net  -3250 ml   Filed Weights   09/03/14 0500 09/04/14 0616 09/05/14 0500  Weight: 162.2 kg (357 lb 9.4 oz) 163 kg (359 lb 5.6 oz) 162.2 kg (357 lb 9.4 oz)    Exam: General: Alert and awake, oriented x3, not in any acute distress. HEENT: anicteric sclera, pupils reactive to light and accommodation, EOMI CVS: S1-S2 clear, no murmur rubs or gallops Chest: clear to auscultation bilaterally, no wheezing, rales or rhonchi Abdomen: soft nontender, nondistended, normal bowel sounds, no organomegaly Extremities: no cyanosis, clubbing or edema noted bilaterally Neuro: Cranial nerves II-XII intact, no focal neurological deficits  Data Reviewed: Basic Metabolic Panel:  Recent Labs Lab 09/02/14 1952 09/03/14 0407 09/04/14 0410 09/04/14 2059 09/05/14 0400  NA 139 133* 139  --  135  K 4.7 4.3 4.4  --  4.1  CL 106 106 105  --  107  CO2 22 20 25   --  25  GLUCOSE 166* 185* 131*  --  136*  BUN 35* 33* 38*  --  42*  CREATININE 1.66* 1.65* 1.71* 1.71* 1.57*  CALCIUM 8.6 7.8* 8.1*  --  7.8*   Liver Function Tests:  Recent Labs Lab 09/03/14 0407  AST 22  ALT 14  ALKPHOS 103  BILITOT 0.6  PROT 7.5  ALBUMIN 2.6*   No results for input(s): LIPASE, AMYLASE in the last 168 hours. No results for  input(s): AMMONIA in the last 168 hours. CBC:  Recent Labs Lab 09/02/14 1952 09/03/14 0407 09/04/14 0410 09/05/14 0400  WBC 12.7* 14.8* 13.0* 10.5  NEUTROABS 11.6*  --   --   --   HGB 10.1* 8.9* 8.2* 8.1*  HCT 31.9* 28.5* 26.7* 25.3*  MCV 90.4 89.3 89.3 89.4  PLT 238 PLATELET CLUMPS NOTED ON SMEAR, COUNT APPEARS ADEQUATE 193 196   Cardiac Enzymes: No results for input(s): CKTOTAL, CKMB, CKMBINDEX, TROPONINI in the last 168 hours. BNP (last 3 results)  Recent Labs  07/11/14 1959  PROBNP 2482.0*   CBG:  Recent Labs Lab 09/04/14 0752 09/04/14 1137 09/04/14 1657 09/04/14 2145 09/05/14 0745  GLUCAP 126* 168* 167* 125* 119*    Micro Recent Results (from the past 240 hour(s))  Culture, blood (routine x 2)     Status: None   Collection Time: 09/02/14  7:52 PM  Result Value Ref Range Status   Specimen Description BLOOD RIGHT HAND  Final   Special Requests BOTTLES DRAWN AEROBIC AND ANAEROBIC 5ML  Final   Culture   Final    STREPTOCOCCUS GROUP C Note: Gram Stain Report Called to,Read Back By and Verified With: Lethea Killings RN 32P Performed at [facility]    Report Status 09/05/2014 FINAL  Final   Organism ID, Bacteria STREPTOCOCCUS GROUP C  Final      Susceptibility   Streptococcus group c - MIC (ETEST)*    PENICILLIN 0.032 SENSITIVE Sensitive     * STREPTOCOCCUS GROUP C  Culture, blood (routine x 2)     Status: None (Preliminary result)   Collection Time: 09/02/14  9:38 PM  Result Value Ref Range Status   Specimen Description BLOOD LEFT HAND  Final   Special Requests BOTTLES DRAWN AEROBIC AND ANAEROBIC 3CC  Final   Culture   Final           BLOOD CULTURE RECEIVED NO GROWTH TO DATE CULTURE WILL BE HELD FOR 5 DAYS BEFORE ISSUING A FINAL NEGATIVE REPORT Performed at [facility]    Report Status PENDING  Incomplete  MRSA PCR Screening     Status: Abnormal   Collection Time: 09/03/14  2:25 AM  Result Value Ref Range Status   MRSA by PCR  POSITIVE (A) NEGATIVE Final    Comment:        The GeneXpert MRSA Assay (FDA approved for NASAL specimens only), is one component of a comprehensive MRSA colonization surveillance program. It is not intended to diagnose MRSA infection nor to guide or monitor treatment for MRSA infections. RESULT CALLED TO, READ BACK BY AND VERIFIED WITH: L. REINERT RN AT [phone] ON 01.08.16 BY SHUEA   Urine culture     Status: None   Collection Time: 09/03/14 11:20 AM  Result Value Ref Range Status   Specimen Description URINE, CLEAN CATCH  Final   Special Requests NONE  Final   Colony Count   Final    1,000 COLONIES/ML Performed at [facility]    Culture   Final    INSIGNIFICANT GROWTH Performed at [facility]    Report Status  09/04/2014 FINAL  Final     Studies: No results found.  Scheduled Meds: . allopurinol  100 mg Oral Daily  . amLODipine  10 mg Oral Daily  . cholecalciferol  1,000 Units Oral Daily  . furosemide  40 mg Intravenous BID  . gabapentin  300 mg Oral TID  . heparin subcutaneous  5,000 Units Subcutaneous 3 times per day  . imipenem-cilastatin  250 mg Intravenous Q6H  . insulin aspart  0-9 Units Subcutaneous TID WC  . isosorbide mononitrate  10 mg Oral BID  . metoprolol tartrate  75 mg Oral BID  . nutrition supplement (JUVEN)  1 packet Oral BID  . pantoprazole  40 mg Oral Daily  . polyethylene glycol  17 g Oral Daily  . senna  1 tablet Oral BID  . sodium chloride  10-40 mL Intracatheter Q12H  . sodium chloride  3 mL Intravenous Q12H   Continuous Infusions:      Time spent: 35 minutes    [facility] A  [facility] Pager [phone] If 7PM-7AM, please contact night-coverage at [email or web address], password [facility] 09/05/2014, 11:06 AM  LOS: 3 days

## 2014-09-05 NOTE — Progress Notes (Signed)
CARE MANAGEMENT NOTE 09/05/2014  Patient:  Kelly [name], Kelly [name]   Account Number:  [id number]  Date Initiated:  09/05/2014  Documentation initiated by:  [facility]  Subjective/Objective Assessment:   Cellulitis, leg     Action/Plan:   Anticipated DC Date:     Anticipated DC Plan:  [place]  CM consult      Choice offered to / List presented to:             Status of service:  In process, will continue to follow Medicare Important Message given?  YES (If response is "NO", the following Medicare IM given date fields will be blank) Date Medicare IM given:  09/05/2014 Medicare IM given by:  [facility] Date Additional Medicare IM given:   Additional Medicare IM given by:    Discharge Disposition:    Per UR Regulation:    If discussed at Long Length of Stay Meetings, dates discussed:    Comments:  09/05/2014 1742 NCM spoke to pt and states she is active with [facility]. Waiting final dc orders. Jonnie Finner RN CCM Case Mgmt phone [phone]

## 2014-09-05 NOTE — Progress Notes (Signed)
Pt refused CPAP for tonight. Pt states she has not used CPAP in a while. RT will continue to monitor as needed.

## 2014-09-05 NOTE — Progress Notes (Signed)
[facility] PROGRESS NOTE   Kelly [name] VZD:[id number] DOB: June 17, 1946 DOA: 09/02/2014 PCP: Alvester Chou, NP  HPI/Subjective: Feels better than yesterday, afebrile this morning.  Assessment/Plan: Principal Problem:   Cellulitis, leg Active Problems:   Essential hypertension   Diabetes mellitus type 2 with neurological manifestations   Acute on chronic renal failure   Morbid obesity   Pulmonary embolism   Anemia of chronic renal failure, stage 3 (moderate)   Diabetic neuropathy   Gout   DM type 2 causing CKD stage 3   Coronary atherosclerosis of native coronary artery   Sepsis   DVT (deep venous thrombosis)    Cellulitis of the lower extremities, severe non-purulent Bilateral low-density cellulitis, patient has chronic lymphedema and recurrent cellulitis bilaterally. Had previous sepsis from cellulitis. Blood cultures obtained. Started on vancomycin and Primaxin, continued. Obtain bilateral lower extremity duplex to rule out DVT  Sepsis Presented with temp of 102 and heart rate of 118 the presence of cellulitis. Started on broad-spectrum antibiotics, namely vancomycin and Primaxin. No aggressive IV fluid hydration because of lower extremity edema and concerns of fluid overload.  Diabetes mellitus type 2 Insulin sliding scale, check hemoglobin A1c. Carbohydrate modified diet.  CKD stage III Likely secondary to diabetic nephropathy and history of hypertension. Baseline creatinine about 1.3, presented with creatinine of 1.6. Has lower extremity edema elevate lower extremities and continue Lasix.   History of PE/DVT Has history of PE/DVT reportedly last year, she was been on Eliquis for about 5 months. Last lower extremity Doppler done on 05/11/2014 and showed no evidence of DVT involving the lower extremities. Patient was on heparin drip, I do not see any indication to continue therapeutic heparin, discontinue heparin drip.   Code Status: Full code Family  Communication: Plan discussed with the patient. Disposition Plan: Remains inpatient   Consultants:  None  Procedures:  None  Antibiotics:  Vancomycin and Primaxin   Objective: Filed Vitals:   09/05/14 1035  BP: 139/60  Pulse: 74  Temp:   Resp:     Intake/Output Summary (Last 24 hours) at 09/05/14 1106 Last data filed at 09/05/14 1045  Gross per 24 hour  Intake    600 ml  Output   3850 ml  Net  -3250 ml   Filed Weights   09/03/14 0500 09/04/14 0616 09/05/14 0500  Weight: 162.2 kg (357 lb 9.4 oz) 163 kg (359 lb 5.6 oz) 162.2 kg (357 lb 9.4 oz)    Exam: General: Alert and awake, oriented x3, not in any acute distress. HEENT: anicteric sclera, pupils reactive to light and accommodation, EOMI CVS: S1-S2 clear, no murmur rubs or gallops Chest: clear to auscultation bilaterally, no wheezing, rales or rhonchi Abdomen: soft nontender, nondistended, normal bowel sounds, no organomegaly Extremities: no cyanosis, clubbing or edema noted bilaterally Neuro: Cranial nerves II-XII intact, no focal neurological deficits  Data Reviewed: Basic Metabolic Panel:  Recent Labs Lab 09/02/14 1952 09/03/14 0407 09/04/14 0410 09/04/14 2059 09/05/14 0400  NA 139 133* 139  --  135  K 4.7 4.3 4.4  --  4.1  CL 106 106 105  --  107  CO2 22 20 25   --  25  GLUCOSE 166* 185* 131*  --  136*  BUN 35* 33* 38*  --  42*  CREATININE 1.66* 1.65* 1.71* 1.71* 1.57*  CALCIUM 8.6 7.8* 8.1*  --  7.8*   Liver Function Tests:  Recent Labs Lab 09/03/14 0407  AST 22  ALT 14  ALKPHOS 103  BILITOT  0.6  PROT 7.5  ALBUMIN 2.6*   No results for input(s): LIPASE, AMYLASE in the last 168 hours. No results for input(s): AMMONIA in the last 168 hours. CBC:  Recent Labs Lab 09/02/14 1952 09/03/14 0407 09/04/14 0410 09/05/14 0400  WBC 12.7* 14.8* 13.0* 10.5  NEUTROABS 11.6*  --   --   --   HGB 10.1* 8.9* 8.2* 8.1*  HCT 31.9* 28.5* 26.7* 25.3*  MCV 90.4 89.3 89.3 89.4  PLT 238 PLATELET  CLUMPS NOTED ON SMEAR, COUNT APPEARS ADEQUATE 193 196   Cardiac Enzymes: No results for input(s): CKTOTAL, CKMB, CKMBINDEX, TROPONINI in the last 168 hours. BNP (last 3 results)  Recent Labs  07/11/14 1959  PROBNP 2482.0*   CBG:  Recent Labs Lab 09/04/14 0752 09/04/14 1137 09/04/14 1657 09/04/14 2145 09/05/14 0745  GLUCAP 126* 168* 167* 125* 119*    Micro Recent Results (from the past 240 hour(s))  Culture, blood (routine x 2)     Status: None   Collection Time: 09/02/14  7:52 PM  Result Value Ref Range Status   Specimen Description BLOOD RIGHT HAND  Final   Special Requests BOTTLES DRAWN AEROBIC AND ANAEROBIC 5ML  Final   Culture   Final    STREPTOCOCCUS GROUP C Note: Gram Stain Report Called to,Read Back By and Verified With: Lethea Killings RN 62P Performed at [facility]    Report Status 09/05/2014 FINAL  Final   Organism ID, Bacteria STREPTOCOCCUS GROUP C  Final      Susceptibility   Streptococcus group c - MIC (ETEST)*    PENICILLIN 0.032 SENSITIVE Sensitive     * STREPTOCOCCUS GROUP C  Culture, blood (routine x 2)     Status: None (Preliminary result)   Collection Time: 09/02/14  9:38 PM  Result Value Ref Range Status   Specimen Description BLOOD LEFT HAND  Final   Special Requests BOTTLES DRAWN AEROBIC AND ANAEROBIC 3CC  Final   Culture   Final           BLOOD CULTURE RECEIVED NO GROWTH TO DATE CULTURE WILL BE HELD FOR 5 DAYS BEFORE ISSUING A FINAL NEGATIVE REPORT Performed at [facility]    Report Status PENDING  Incomplete  MRSA PCR Screening     Status: Abnormal   Collection Time: 09/03/14  2:25 AM  Result Value Ref Range Status   MRSA by PCR POSITIVE (A) NEGATIVE Final    Comment:        The GeneXpert MRSA Assay (FDA approved for NASAL specimens only), is one component of a comprehensive MRSA colonization surveillance program. It is not intended to diagnose MRSA infection nor to guide or monitor treatment for MRSA  infections. RESULT CALLED TO, READ BACK BY AND VERIFIED WITH: L. REINERT RN AT [phone] ON 01.08.16 BY SHUEA   Urine culture     Status: None   Collection Time: 09/03/14 11:20 AM  Result Value Ref Range Status   Specimen Description URINE, CLEAN CATCH  Final   Special Requests NONE  Final   Colony Count   Final    1,000 COLONIES/ML Performed at [facility]    Culture   Final    INSIGNIFICANT GROWTH Performed at [facility]    Report Status 09/04/2014 FINAL  Final     Studies: No results found.  Scheduled Meds: . allopurinol  100 mg Oral Daily  . amLODipine  10 mg Oral Daily  . cholecalciferol  1,000 Units Oral Daily  .  furosemide  40 mg Intravenous BID  . gabapentin  300 mg Oral TID  . heparin subcutaneous  5,000 Units Subcutaneous 3 times per day  . imipenem-cilastatin  250 mg Intravenous Q6H  . insulin aspart  0-9 Units Subcutaneous TID WC  . isosorbide mononitrate  10 mg Oral BID  . metoprolol tartrate  75 mg Oral BID  . nutrition supplement (JUVEN)  1 packet Oral BID  . pantoprazole  40 mg Oral Daily  . polyethylene glycol  17 g Oral Daily  . senna  1 tablet Oral BID  . sodium chloride  10-40 mL Intracatheter Q12H  . sodium chloride  3 mL Intravenous Q12H   Continuous Infusions:      Time spent: 35 minutes    [facility] A  [facility] Pager [phone] If 7PM-7AM, please contact night-coverage at [email or web address], password [facility] 09/05/2014, 11:06 AM  LOS: 3 days

## 2014-09-05 NOTE — Progress Notes (Signed)
[facility] PROGRESS NOTE   Kelly [name] FFM:[id number] DOB: [birth year] DOA: 09/02/2014 PCP: Alvester Chou, NP  HPI/Subjective: Feels better, cannot get out of the bed, feels wet all the time [place]. Asked for Foley catheter, Foley will be placed.  Assessment/Plan: Principal Problem:   Cellulitis, leg Active Problems:   Essential hypertension   Diabetes mellitus type 2 with neurological manifestations   Acute on chronic renal failure   Morbid obesity   Pulmonary embolism   Anemia of chronic renal failure, stage 3 (moderate)   Diabetic neuropathy   Gout   DM type 2 causing CKD stage 3   Coronary atherosclerosis of native coronary artery   Sepsis   DVT (deep venous thrombosis)    Cellulitis of the lower extremities, severe non-purulent Bilateral low-density cellulitis, patient has chronic lymphedema and recurrent cellulitis bilaterally. Had previous sepsis from cellulitis. Blood cultures obtained. Started on vancomycin and Primaxin, continued.  Sepsis Presented with temp of 102 and heart rate of 118 the presence of cellulitis. Started on broad-spectrum antibiotics, namely vancomycin and Primaxin. No aggressive IV fluid hydration because of lower extremity edema and concerns of fluid overload.  Diabetes mellitus type 2 Insulin sliding scale, check hemoglobin A1c. Carbohydrate modified diet.  CKD stage III Likely secondary to diabetic nephropathy and history of hypertension. Baseline creatinine about 1.3, presented with creatinine of 1.6. Has lower extremity edema elevate lower extremities and continue Lasix.   History of PE/DVT Has history of PE/DVT reportedly last year, she was been on Eliquis for about 5 months. Last lower extremity Doppler done on 05/11/2014 and showed no evidence of DVT involving the lower extremities. Patient was on heparin drip, I do not see any indication to continue therapeutic heparin, discontinue heparin drip.   Code  Status: Full code Family Communication: Plan discussed with the patient. Disposition Plan: Remains inpatient   Consultants:  None  Procedures:  None  Antibiotics:  Vancomycin and Primaxin   Objective: Filed Vitals:   09/05/14 1035  BP: 139/60  Pulse: 74  Temp:   Resp:     Intake/Output Summary (Last 24 hours) at 09/05/14 1105 Last data filed at 09/05/14 1045  Gross per 24 hour  Intake    600 ml  Output   3850 ml  Net  -3250 ml   Filed Weights   09/03/14 0500 09/04/14 0616 09/05/14 0500  Weight: 162.2 kg (357 lb 9.4 oz) 163 kg (359 lb 5.6 oz) 162.2 kg (357 lb 9.4 oz)    Exam: General: Alert and awake, oriented x3, not in any acute distress. HEENT: anicteric sclera, pupils reactive to light and accommodation, EOMI CVS: S1-S2 clear, no murmur rubs or gallops Chest: clear to auscultation bilaterally, no wheezing, rales or rhonchi Abdomen: soft nontender, nondistended, normal bowel sounds, no organomegaly Extremities: no cyanosis, clubbing or edema noted bilaterally Neuro: Cranial nerves II-XII intact, no focal neurological deficits  Data Reviewed: Basic Metabolic Panel:  Recent Labs Lab 09/02/14 1952 09/03/14 0407 09/04/14 0410 09/04/14 2059 09/05/14 0400  NA 139 133* 139  --  135  K 4.7 4.3 4.4  --  4.1  CL 106 106 105  --  107  CO2 22 20 25   --  25  GLUCOSE 166* 185* 131*  --  136*  BUN 35* 33* 38*  --  42*  CREATININE 1.66* 1.65* 1.71* 1.71* 1.57*  CALCIUM 8.6 7.8* 8.1*  --  7.8*   Liver Function Tests:  Recent Labs Lab 09/03/14 0407  AST 22  ALT 14  ALKPHOS 103  BILITOT 0.6  PROT 7.5  ALBUMIN 2.6*   No results for input(s): LIPASE, AMYLASE in the last 168 hours. No results for input(s): AMMONIA in the last 168 hours. CBC:  Recent Labs Lab 09/02/14 1952 09/03/14 0407 09/04/14 0410 09/05/14 0400  WBC 12.7* 14.8* 13.0* 10.5  NEUTROABS 11.6*  --   --   --   HGB 10.1* 8.9* 8.2* 8.1*  HCT 31.9* 28.5* 26.7* 25.3*  MCV 90.4 89.3  89.3 89.4  PLT 238 PLATELET CLUMPS NOTED ON SMEAR, COUNT APPEARS ADEQUATE 193 196   Cardiac Enzymes: No results for input(s): CKTOTAL, CKMB, CKMBINDEX, TROPONINI in the last 168 hours. BNP (last 3 results)  Recent Labs  07/11/14 1959  PROBNP 2482.0*   CBG:  Recent Labs Lab 09/04/14 0752 09/04/14 1137 09/04/14 1657 09/04/14 2145 09/05/14 0745  GLUCAP 126* 168* 167* 125* 119*    Micro Recent Results (from the past 240 hour(s))  Culture, blood (routine x 2)     Status: None   Collection Time: 09/02/14  7:52 PM  Result Value Ref Range Status   Specimen Description BLOOD RIGHT HAND  Final   Special Requests BOTTLES DRAWN AEROBIC AND ANAEROBIC 5ML  Final   Culture   Final    STREPTOCOCCUS GROUP C Note: Gram Stain Report Called to,Read Back By and Verified With: Lethea Killings RN 81P Performed at [facility]    Report Status 09/05/2014 FINAL  Final   Organism ID, Bacteria STREPTOCOCCUS GROUP C  Final      Susceptibility   Streptococcus group c - MIC (ETEST)*    PENICILLIN 0.032 SENSITIVE Sensitive     * STREPTOCOCCUS GROUP C  Culture, blood (routine x 2)     Status: None (Preliminary result)   Collection Time: 09/02/14  9:38 PM  Result Value Ref Range Status   Specimen Description BLOOD LEFT HAND  Final   Special Requests BOTTLES DRAWN AEROBIC AND ANAEROBIC 3CC  Final   Culture   Final           BLOOD CULTURE RECEIVED NO GROWTH TO DATE CULTURE WILL BE HELD FOR 5 DAYS BEFORE ISSUING A FINAL NEGATIVE REPORT Performed at [facility]    Report Status PENDING  Incomplete  MRSA PCR Screening     Status: Abnormal   Collection Time: 09/03/14  2:25 AM  Result Value Ref Range Status   MRSA by PCR POSITIVE (A) NEGATIVE Final    Comment:        The GeneXpert MRSA Assay (FDA approved for NASAL specimens only), is one component of a comprehensive MRSA colonization surveillance program. It is not intended to diagnose MRSA infection nor to guide  or monitor treatment for MRSA infections. RESULT CALLED TO, READ BACK BY AND VERIFIED WITH: L. REINERT RN AT [phone] ON 01.08.16 BY SHUEA   Urine culture     Status: None   Collection Time: 09/03/14 11:20 AM  Result Value Ref Range Status   Specimen Description URINE, CLEAN CATCH  Final   Special Requests NONE  Final   Colony Count   Final    1,000 COLONIES/ML Performed at [facility]    Culture   Final    INSIGNIFICANT GROWTH Performed at [facility]    Report Status 09/04/2014 FINAL  Final     Studies: No results found.  Scheduled Meds: . allopurinol  100 mg Oral Daily  . amLODipine  10 mg Oral  Daily  . cholecalciferol  1,000 Units Oral Daily  . furosemide  40 mg Intravenous BID  . gabapentin  300 mg Oral TID  . heparin subcutaneous  5,000 Units Subcutaneous 3 times per day  . imipenem-cilastatin  250 mg Intravenous Q6H  . insulin aspart  0-9 Units Subcutaneous TID WC  . isosorbide mononitrate  10 mg Oral BID  . metoprolol tartrate  75 mg Oral BID  . nutrition supplement (JUVEN)  1 packet Oral BID  . pantoprazole  40 mg Oral Daily  . polyethylene glycol  17 g Oral Daily  . senna  1 tablet Oral BID  . sodium chloride  10-40 mL Intracatheter Q12H  . sodium chloride  3 mL Intravenous Q12H   Continuous Infusions:      Time spent: 35 minutes    [facility]  [facility] Pager [phone] If 7PM-7AM, please contact night-coverage at [email or web address], password [facility] 09/05/2014, 11:05 AM  LOS: 3 days

## 2014-09-05 NOTE — Consult Note (Signed)
[facility]    Date of Admission:  09/02/2014            Day 3 vancomycin        Day 3 imipenem       Reason for Consult: Recurrent streptococcal cellulitis of lower extremities with bacteremia    Referring Physician: Dr. Verlee Monte  Principal Problem:   Recurrent cellulitis of lower leg Active Problems:   Sepsis   Streptococcal bacteremia   Essential hypertension   Diabetes mellitus type 2 with neurological manifestations   Acute on chronic renal failure   Morbid obesity   Pulmonary embolism   Anemia of chronic renal failure, stage 3 (moderate)   Diabetic neuropathy   Gout   DM type 2 causing CKD stage 3   Coronary atherosclerosis of native coronary artery   Open thigh wound   DVT (deep venous thrombosis)   Cellulitis, leg   . allopurinol  100 mg Oral Daily  . amLODipine  10 mg Oral Daily  . cholecalciferol  1,000 Units Oral Daily  . furosemide  40 mg Intravenous BID  . gabapentin  300 mg Oral TID  . heparin subcutaneous  5,000 Units Subcutaneous 3 times per day  . imipenem-cilastatin  250 mg Intravenous Q6H  . insulin aspart  0-9 Units Subcutaneous TID WC  . isosorbide mononitrate  10 mg Oral BID  . metoprolol tartrate  75 mg Oral BID  . nutrition supplement (JUVEN)  1 packet Oral BID  . pantoprazole  40 mg Oral Daily  . polyethylene glycol  17 g Oral Daily  . senna  1 tablet Oral BID  . sodium chloride  10-40 mL Intracatheter Q12H  . sodium chloride  3 mL Intravenous Q12H    Recommendations: 1. Continue vancomycin 2. Discontinue imipenem   Assessment: Her major risk factors for recurrent cellulitis are her obesity, chronic edema, poorly healing left thigh wound and chronic dermatitis. She is obviously colonized with group C strep leading to recurrent infections and transient bacteremia. She is not likely to have endocarditis or other deep infection. As before, she responded promptly to antibiotic therapy. I will  continue vancomycin alone for now. I will call [facility] in [place], [place] [phone]) in the morning to determine what antibiotic was prescribed by Dr. Posey Pronto. She should be ready for conversion to an oral antibiotic regimen tomorrow.   HPI: Kelly [name] is a 69 y.o. female with morbid obesity, chronic lower extremity lymphedema and dermatitis. She underwent left thigh panniculectomy by Dr. Luetta Nutting at [facility] last June. She has been followed by Dr. Theodoro Kos since that time for her left thigh wound. She was hospitalized in September with left leg cellulitis and group C streptococcal bacteremia. She improved promptly with antibiotic therapy. She had a recurrence of left thigh cellulitis and transient bacteremia in November. She improved with therapy. She was readmitted 2 days ago with similar findings and fever. One of 2 blood cultures has again grown group C strep. No evidence of endocarditis was found on a transthoracic echocardiogram last November. She is feeling much better now and is afebrile. She is listed as being allergic to penicillin but apparently has tolerated cephalexin. However, she states that when she was discharged last time on cephalexin she had difficulty taking it 4 times a day and did not feel like it worked very well. She states that her nephrologist, Dr. Posey Pronto,  switched her to some antibiotic that she took twice daily. She states that she tolerated that much better and that her cellulitis resolved promptly. She does not recall the name of that antibiotic.   Review of Systems: Pertinent items are noted in HPI.  Past Medical History  Diagnosis Date  . Hypertension   . Hyperlipidemia   . CHF (congestive heart failure)   . GERD (gastroesophageal reflux disease)   . Depression   . Lymphedema of leg     left leg  . Pulmonary embolism 02/18/14    diagnosed at [facility] with CT angio chest  . Chronic indwelling Foley catheter   .  Heart murmur     "just found out I have one" (04/07/2014)  . DVT (deep venous thrombosis) 01/2014    LLE; "went from my leg to my lungs"  . Pneumonia 1990's X 2  . Sleep apnea     doesn't use cpap machine or 02 at night (04/07/2014)  . Anemia   . History of blood transfusion 1990's X 2    "when they did my surgeries"  . Migraines     "none for years now" (04/07/2014)  . Arthritis     "arms, legs" (04/07/2014)  . Gout   . Urinary incontinence   . Chronic kidney disease (CKD), stage III (moderate)     [name] 04/07/2014  . Neuropathy   . Diabetes mellitus     "at one time" (04/07/2014)    History  Substance Use Topics  . Smoking status: Former Smoker -- 1.00 packs/day for 33 years    Types: Cigarettes    Quit date: 08/27/1981  . Smokeless tobacco: Never Used  . Alcohol Use: Yes     Comment: 04/07/2014 "used to drink; stopped in ~ 1983"    Family History  Problem Relation Age of Onset  . Emphysema Father   . Asthma Brother   . Heart disease Mother   . Stomach cancer Brother   . Heart disease Maternal Grandmother   . Diabetes Mother   . Diabetes Sister    Allergies  Allergen Reactions  . Bactrim [Sulfamethoxazole-Trimethoprim] Other (See Comments)    Hyperkalemia (July 2015)  . Penicillins Hives    Has taken cephalexin 06/2014    OBJECTIVE: Blood pressure 135/52, pulse 74, temperature 98.4 F (36.9 C), temperature source Oral, resp. rate 19, height 5\' 6"  (1.676 m), weight 357 lb 9.4 oz (162.2 kg), SpO2 100 %. General: She is alert and in no distress Lungs: Clear anteriorly Cor: Distant heart sounds. Regular with no murmur heard Abdomen: Obese, soft and nontender Extremities: She has chronic nonpitting edema. She has multiple areas of chronic thickened, scaly skin on her feet and legs. She still has an open wound on her left inner thigh. She has minimal cellulitis currently.  Lab Results Lab Results  Component Value Date   WBC 10.5 09/05/2014   HGB 8.1* 09/05/2014    HCT 25.3* 09/05/2014   MCV 89.4 09/05/2014   PLT 196 09/05/2014    Lab Results  Component Value Date   CREATININE 1.57* 09/05/2014   BUN 42* 09/05/2014   NA 135 09/05/2014   K 4.1 09/05/2014   CL 107 09/05/2014   CO2 25 09/05/2014    Lab Results  Component Value Date   ALT 14 09/03/2014   AST 22 09/03/2014   ALKPHOS 103 09/03/2014   BILITOT 0.6 09/03/2014     Microbiology: Recent Results (from the past 240 hour(s))  Culture, blood (routine  x 2)     Status: None   Collection Time: 09/02/14  7:52 PM  Result Value Ref Range Status   Specimen Description BLOOD RIGHT HAND  Final   Special Requests BOTTLES DRAWN AEROBIC AND ANAEROBIC 5ML  Final   Culture   Final    STREPTOCOCCUS GROUP C Note: Gram Stain Report Called to,Read Back By and Verified With: Lethea Killings RN 3P Performed at [facility]    Report Status 09/05/2014 FINAL  Final   Organism ID, Bacteria STREPTOCOCCUS GROUP C  Final      Susceptibility   Streptococcus group c - MIC (ETEST)*    PENICILLIN 0.032 SENSITIVE Sensitive     * STREPTOCOCCUS GROUP C  Culture, blood (routine x 2)     Status: None (Preliminary result)   Collection Time: 09/02/14  9:38 PM  Result Value Ref Range Status   Specimen Description BLOOD LEFT HAND  Final   Special Requests BOTTLES DRAWN AEROBIC AND ANAEROBIC 3CC  Final   Culture   Final           BLOOD CULTURE RECEIVED NO GROWTH TO DATE CULTURE WILL BE HELD FOR 5 DAYS BEFORE ISSUING A FINAL NEGATIVE REPORT Performed at [facility]    Report Status PENDING  Incomplete  MRSA PCR Screening     Status: Abnormal   Collection Time: 09/03/14  2:25 AM  Result Value Ref Range Status   MRSA by PCR POSITIVE (A) NEGATIVE Final    Comment:        The GeneXpert MRSA Assay (FDA approved for NASAL specimens only), is one component of a comprehensive MRSA colonization surveillance program. It is not intended to diagnose MRSA infection nor to guide or monitor treatment  for MRSA infections. RESULT CALLED TO, READ BACK BY AND VERIFIED WITH: LValora Corporal RN AT [phone] ON 01.08.16 BY SHUEA   Urine culture     Status: None   Collection Time: 09/03/14 11:20 AM  Result Value Ref Range Status   Specimen Description URINE, CLEAN CATCH  Final   Special Requests NONE  Final   Colony Count   Final    1,000 COLONIES/ML Performed at [facility]    Culture   Final    INSIGNIFICANT GROWTH Performed at [facility]    Report Status 09/04/2014 FINAL  Final    Michel Bickers, MD [place] [facility] [phone] pager   [phone] cell 09/05/2014, 4:45 PM

## 2014-09-06 ENCOUNTER — Encounter (HOSPITAL_BASED_OUTPATIENT_CLINIC_OR_DEPARTMENT_OTHER): Payer: Medicare Other | Attending: Plastic Surgery

## 2014-09-06 DIAGNOSIS — A408 Other streptococcal sepsis: Secondary | ICD-10-CM

## 2014-09-06 LAB — CBC
HEMATOCRIT: 25.4 % — AB (ref 36.0–46.0)
Hemoglobin: 8 g/dL — ABNORMAL LOW (ref 12.0–15.0)
MCH: 28 pg (ref 26.0–34.0)
MCHC: 31.5 g/dL (ref 30.0–36.0)
MCV: 88.8 fL (ref 78.0–100.0)
Platelets: 198 10*3/uL (ref 150–400)
RBC: 2.86 MIL/uL — ABNORMAL LOW (ref 3.87–5.11)
RDW: 16 % — AB (ref 11.5–15.5)
WBC: 7.2 10*3/uL (ref 4.0–10.5)

## 2014-09-06 LAB — GLUCOSE, CAPILLARY
GLUCOSE-CAPILLARY: 106 mg/dL — AB (ref 70–99)
Glucose-Capillary: 154 mg/dL — ABNORMAL HIGH (ref 70–99)
Glucose-Capillary: 161 mg/dL — ABNORMAL HIGH (ref 70–99)
Glucose-Capillary: 132 mg/dL — ABNORMAL HIGH (ref 70–99)

## 2014-09-06 LAB — BASIC METABOLIC PANEL
Anion gap: 3 — ABNORMAL LOW (ref 5–15)
BUN: 38 mg/dL — ABNORMAL HIGH (ref 6–23)
CHLORIDE: 106 meq/L (ref 96–112)
CO2: 26 mmol/L (ref 19–32)
Calcium: 7.8 mg/dL — ABNORMAL LOW (ref 8.4–10.5)
Creatinine, Ser: 1.4 mg/dL — ABNORMAL HIGH (ref 0.50–1.10)
GFR calc Af Amer: 44 mL/min — ABNORMAL LOW (ref >90)
GFR, EST NON AFRICAN AMERICAN: 38 mL/min — AB (ref >90)
Glucose, Bld: 117 mg/dL — ABNORMAL HIGH (ref 70–99)
Potassium: 4.2 mmol/L (ref 3.5–5.1)
SODIUM: 135 mmol/L (ref 135–145)

## 2014-09-06 MED ORDER — CEPHALEXIN 500 MG PO CAPS
500.0000 mg | ORAL_CAPSULE | Freq: Four times a day (QID) | ORAL | Status: DC
  Administered 2014-09-06 – 2014-09-07 (×4): 500 mg via ORAL
  Filled 2014-09-06 (×8): qty 1

## 2014-09-06 MED ORDER — VITAMIN C 500 MG PO TABS
500.0000 mg | ORAL_TABLET | Freq: Every day | ORAL | Status: DC
  Administered 2014-09-06 – 2014-09-09 (×4): 500 mg via ORAL
  Filled 2014-09-06 (×4): qty 1

## 2014-09-06 MED ORDER — TAB-A-VITE/IRON PO TABS
1.0000 | ORAL_TABLET | Freq: Every day | ORAL | Status: DC
  Administered 2014-09-06 – 2014-09-09 (×4): 1 via ORAL
  Filled 2014-09-06 (×4): qty 1

## 2014-09-06 MED ORDER — ZINC SULFATE 220 (50 ZN) MG PO CAPS
220.0000 mg | ORAL_CAPSULE | Freq: Every day | ORAL | Status: DC
  Administered 2014-09-06 – 2014-09-09 (×4): 220 mg via ORAL
  Filled 2014-09-06 (×4): qty 1

## 2014-09-06 NOTE — Consult Note (Signed)
Reason for Consult:left leg swelling Referring Physician: medicine  Kelly [name] is an 68 y.o. female.  HPI: the patient is a 69 yrs old bf here for left leg swelling and cellulitis.  She underwent an extensive panniculectomy last year at [facility].  She had multiple complications with bleeding, PE and wound complications.  She is able to transfer and is working with physical therapy.  The medial thigh wound has healed very well.  She is prone to lymphedema and this is occuring again in the upper and lower leg.  There is no sign of infection noted today.  She is in the chair with her legs dependent.    Past Medical History  Diagnosis Date  . Hypertension   . Hyperlipidemia   . CHF (congestive heart failure)   . GERD (gastroesophageal reflux disease)   . Depression   . Lymphedema of leg     left leg  . Pulmonary embolism 02/18/14    diagnosed at [facility] with CT angio chest  . Chronic indwelling Foley catheter   . Heart murmur     "just found out I have one" (04/07/2014)  . DVT (deep venous thrombosis) 01/2014    LLE; "went from my leg to my lungs"  . Pneumonia 1990's X 2  . Sleep apnea     doesn't use cpap machine or 02 at night (04/07/2014)  . Anemia   . History of blood transfusion 1990's X 2    "when they did my surgeries"  . Migraines     "none for years now" (04/07/2014)  . Arthritis     "arms, legs" (04/07/2014)  . Gout   . Urinary incontinence   . Chronic kidney disease (CKD), stage III (moderate)     [name] 04/07/2014  . Neuropathy   . Diabetes mellitus     "at one time" (04/07/2014)    Past Surgical History  Procedure Laterality Date  . Shoulder open rotator cuff repair Right ~ 2000  . Knee arthroscopy Right ?1980's  . Temporal artery biopsy / ligation Right ?1990's  . Colonoscopy with propofol  09/23/2012    Procedure: COLONOSCOPY WITH PROPOFOL;  Surgeon: Jerene Bears, MD;  Location: [facility];  Service: Gastroenterology;  Laterality: N/A;  . Panniculectomy Left  02/08/2014    w/wound debridement @ medial thigh  . Irrigation and debridement abscess Left 02/24/2014    Procedure: MINOR INCISION AND DRAINAGE OF ABSCESS;  Surgeon: Theodoro Kos, DO;  Location: [facility];  Service: Plastics;  Laterality: Left;  . I&d extremity Left 03/03/2014    Procedure: IRRIGATION AND DEBRIDEMENT LEFT LEG WOUND AND PLACEMENT OF A CELL AND VAC;  Surgeon: Theodoro Kos, DO;  Location: [place];  Service: Plastics;  Laterality: Left;  . Incision and drainage of wound Left 03/16/2014    Procedure: IRRIGATION AND DEBRIDEMENT OF LEFT THIGH WOUND, APPLICATION ACELL;  Surgeon: Irene Limbo, MD;  Location: [place];  Service: Plastics;  Laterality: Left;  . Incision and drainage of wound Left 03/25/2014    Procedure: IRRIGATION AND DEBRIDEMENT OF LEFT LEG WOUND WITH PLACEMENT OF ACELL/VAC;  Surgeon: Theodoro Kos, DO;  Location: [place];  Service: Plastics;  Laterality: Left;  . Incision and drainage of wound Left 03/31/2014    Procedure: IRRIGATION AND DEBRIDEMENT LEFT LEG WOUND WITH PLACEMENT OF A-CELL/VAC;  Surgeon: Theodoro Kos, DO;  Location: [place];  Service: Plastics;  Laterality: Left;  . Cataract extraction w/ intraocular lens implant Right 2014  . Vena cava filter placement  02/2014  . Incision and drainage of wound Left 04/07/2014    Procedure: IRRIGATION AND DEBRIDEMENT LEFT LEG WOUND WITH PLACEMENT OF A CELL AND VAC;  Surgeon: Theodoro Kos, DO;  Location: [place];  Service: Plastics;  Laterality: Left;  . Incision and drainage of wound Left 04/14/2014    Procedure: IRRIGATION AND DEBRIDEMENT WOUND;  Surgeon: Theodoro Kos, DO;  Location: [place];  Service: Plastics;  Laterality: Left;  . Application of a-cell of extremity Left 04/14/2014    Procedure: APPLICATION OF A-CELL OF EXTREMITY;  Surgeon: Theodoro Kos, DO;  Location: [place];  Service: Plastics;  Laterality: Left;  Marland Kitchen Minor application of wound vac Left 04/14/2014    Procedure: MINOR APPLICATION OF WOUND VAC;  Surgeon: Theodoro Kos,  DO;  Location: [place];  Service: Plastics;  Laterality: Left;  . Incision and drainage of wound Left 04/21/2014    Procedure: IRRIGATION AND DEBRIDEMENT OF LEFT UPPER LEG WOUND WITH PLACEMENT OF ACELL/VAC;  Surgeon: Theodoro Kos, DO;  Location: [place];  Service: Plastics;  Laterality: Left;    Family History  Problem Relation Age of Onset  . Emphysema Father   . Asthma Brother   . Heart disease Mother   . Stomach cancer Brother   . Heart disease Maternal Grandmother   . Diabetes Mother   . Diabetes Sister     Social History:  reports that she quit smoking about 33 years ago. Her smoking use included Cigarettes. She has a 33 pack-year smoking history. She has never used smokeless tobacco. She reports that she drinks alcohol. She reports that she does not use illicit drugs.  Allergies:  Allergies  Allergen Reactions  . Bactrim [Sulfamethoxazole-Trimethoprim] Other (See Comments)    Hyperkalemia (July 2015)  . Penicillins Hives    Has taken cephalexin 06/2014    Medications: I have reviewed the patient's current medications.  Results for orders placed or performed during the hospital encounter of 09/02/14 (from the past 48 hour(s))  Glucose, capillary     Status: Abnormal   Collection Time: 09/04/14  4:57 PM  Result Value Ref Range   Glucose-Capillary 167 (H) 70 - 99 mg/dL  Creatinine, serum     Status: Abnormal   Collection Time: 09/04/14  8:59 PM  Result Value Ref Range   Creatinine, Ser 1.71 (H) 0.50 - 1.10 mg/dL   GFR calc non Af Amer 30 (L) >90 mL/min   GFR calc Af Amer 35 (L) >90 mL/min    Comment: (NOTE) The eGFR has been calculated using the CKD EPI equation. This calculation has not been validated in all clinical situations. eGFR's persistently <90 mL/min signify possible Chronic Kidney Disease.   Glucose, capillary     Status: Abnormal   Collection Time: 09/04/14  9:45 PM  Result Value Ref Range   Glucose-Capillary 125 (H) 70 - 99 mg/dL  CBC     Status: Abnormal    Collection Time: 09/05/14  4:00 AM  Result Value Ref Range   WBC 10.5 4.0 - 10.5 K/uL   RBC 2.83 (L) 3.87 - 5.11 MIL/uL   Hemoglobin 8.1 (L) 12.0 - 15.0 g/dL   HCT 25.3 (L) 36.0 - 46.0 %   MCV 89.4 78.0 - 100.0 fL   MCH 28.6 26.0 - 34.0 pg   MCHC 32.0 30.0 - 36.0 g/dL   RDW 16.3 (H) 11.5 - 15.5 %   Platelets 196 150 - 400 K/uL  Basic metabolic panel     Status: Abnormal   Collection  Time: 09/05/14  4:00 AM  Result Value Ref Range   Sodium 135 135 - 145 mmol/L    Comment: Please note change in reference range.   Potassium 4.1 3.5 - 5.1 mmol/L    Comment: Please note change in reference range.   Chloride 107 96 - 112 mEq/L   CO2 25 19 - 32 mmol/L   Glucose, Bld 136 (H) 70 - 99 mg/dL   BUN 42 (H) 6 - 23 mg/dL   Creatinine, Ser 1.57 (H) 0.50 - 1.10 mg/dL   Calcium 7.8 (L) 8.4 - 10.5 mg/dL   GFR calc non Af Amer 33 (L) >90 mL/min   GFR calc Af Amer 38 (L) >90 mL/min    Comment: (NOTE) The eGFR has been calculated using the CKD EPI equation. This calculation has not been validated in all clinical situations. eGFR's persistently <90 mL/min signify possible Chronic Kidney Disease.    Anion gap 3 (L) 5 - 15  Glucose, capillary     Status: Abnormal   Collection Time: 09/05/14  7:45 AM  Result Value Ref Range   Glucose-Capillary 119 (H) 70 - 99 mg/dL  Glucose, capillary     Status: Abnormal   Collection Time: 09/05/14 11:38 AM  Result Value Ref Range   Glucose-Capillary 153 (H) 70 - 99 mg/dL  Glucose, capillary     Status: Abnormal   Collection Time: 09/05/14  6:19 PM  Result Value Ref Range   Glucose-Capillary 135 (H) 70 - 99 mg/dL  Glucose, capillary     Status: Abnormal   Collection Time: 09/05/14  9:47 PM  Result Value Ref Range   Glucose-Capillary 153 (H) 70 - 99 mg/dL  CBC     Status: Abnormal   Collection Time: 09/06/14  6:45 AM  Result Value Ref Range   WBC 7.2 4.0 - 10.5 K/uL   RBC 2.86 (L) 3.87 - 5.11 MIL/uL   Hemoglobin 8.0 (L) 12.0 - 15.0 g/dL   HCT 25.4 (L)  36.0 - 46.0 %   MCV 88.8 78.0 - 100.0 fL   MCH 28.0 26.0 - 34.0 pg   MCHC 31.5 30.0 - 36.0 g/dL   RDW 16.0 (H) 11.5 - 15.5 %   Platelets 198 150 - 400 K/uL  Basic metabolic panel     Status: Abnormal   Collection Time: 09/06/14  6:45 AM  Result Value Ref Range   Sodium 135 135 - 145 mmol/L    Comment: Please note change in reference range.   Potassium 4.2 3.5 - 5.1 mmol/L    Comment: Please note change in reference range.   Chloride 106 96 - 112 mEq/L   CO2 26 19 - 32 mmol/L   Glucose, Bld 117 (H) 70 - 99 mg/dL   BUN 38 (H) 6 - 23 mg/dL   Creatinine, Ser 1.40 (H) 0.50 - 1.10 mg/dL   Calcium 7.8 (L) 8.4 - 10.5 mg/dL   GFR calc non Af Amer 38 (L) >90 mL/min   GFR calc Af Amer 44 (L) >90 mL/min    Comment: (NOTE) The eGFR has been calculated using the CKD EPI equation. This calculation has not been validated in all clinical situations. eGFR's persistently <90 mL/min signify possible Chronic Kidney Disease.    Anion gap 3 (L) 5 - 15  Glucose, capillary     Status: Abnormal   Collection Time: 09/06/14  7:44 AM  Result Value Ref Range   Glucose-Capillary 106 (H) 70 - 99 mg/dL  Glucose, capillary  Status: Abnormal   Collection Time: 09/06/14 11:46 AM  Result Value Ref Range   Glucose-Capillary 154 (H) 70 - 99 mg/dL    No results found.  Review of Systems  Constitutional: Negative.   HENT: Negative.   Eyes: Negative.   Respiratory: Negative.   Cardiovascular: Negative.   Gastrointestinal: Negative.   Genitourinary: Negative.   Musculoskeletal: Negative.   Skin: Negative.   Psychiatric/Behavioral: Negative.    Blood pressure 163/62, pulse 73, temperature 97.9 F (36.6 C), temperature source Oral, resp. rate 17, height 5' 6"  (1.676 m), weight 161.4 kg (355 lb 13.2 oz), SpO2 98 %. Physical Exam  Constitutional: She is oriented to person, place, and time. She appears well-developed and well-nourished.  HENT:  Head: Normocephalic and atraumatic.  Eyes: Conjunctivae and  EOM are normal. Pupils are equal, round, and reactive to light.  Cardiovascular: Normal rate.   Respiratory: Effort normal.  Neurological: She is alert and oriented to person, place, and time.  Skin: There is erythema.  Psychiatric: She has a normal mood and affect. Her behavior is normal. Judgment and thought content normal.    Assessment/Plan: Recommend compression stockings to the lower extremities bilaterally, protein intake, multivitamins, vit C, Zinc.  Recommend soaking the feet daily in dial soap daily for 15 minutes.  Triple antibiotic ointment on the left medial groin area.  Follow up in the wound care center.  [place] 09/06/2014, 1:39 PM

## 2014-09-06 NOTE — Progress Notes (Signed)
OT Cancellation Note  Patient Details Name: Kelly [name] MRN: [id number] DOB: 10/24/1945   Cancelled Treatment:    Reason Eval/Treat Not Completed: Fatigue/lethargy limiting ability to participate Will check on pt next day.  Betsy Pries 09/06/2014, 1:54 PM

## 2014-09-06 NOTE — Progress Notes (Signed)
[facility] PROGRESS NOTE   Kelly [name] IEP:[id number] DOB: [birth year] DOA: 09/02/2014 PCP: Alvester Chou, NP  HPI/Subjective: No fever overnight, no events, ambulate and likely switch to oral antibiotics per ID today.  Assessment/Plan: Principal Problem:   Recurrent cellulitis of lower leg Active Problems:   Essential hypertension   Diabetes mellitus type 2 with neurological manifestations   Acute on chronic renal failure   Morbid obesity   Pulmonary embolism   Anemia of chronic renal failure, stage 3 (moderate)   Diabetic neuropathy   Gout   DM type 2 causing CKD stage 3   Coronary atherosclerosis of native coronary artery   Sepsis   Open thigh wound   Streptococcal bacteremia   DVT (deep venous thrombosis)   Cellulitis, leg    Streptococcus group C bacteremia Blood culture grew Streptococcus group C, patient is allergic to penicillin currently on Primaxin. Patient has right-sided PICC line placed for difficult IV access, placed on 09/03/14. Less likely to have persistent bacteremia or deep infection, likely recurrent bacteremia secondary to recurrent cellulitis. ID recommended to continue vancomycin, it appears that patient is still on Primaxin. Likely patient will be appropriate for discharge in a.m. Await final recommendation from ID.  Cellulitis of the lower extremities, severe non-purulent Bilateral low-density cellulitis, patient has chronic lymphedema and recurrent cellulitis bilaterally. Had previous sepsis from cellulitis. Blood cultures obtained. Started on vancomycin and Primaxin, continued. Primaxin continued and vancomycin discontinued.  Sepsis Presented with temp of 102 and heart rate of 118 the presence of cellulitis. Started on broad-spectrum antibiotics, namely vancomycin and Primaxin. No aggressive IV fluid hydration because of lower extremity edema and concerns of fluid overload.  Diabetes mellitus type 2 Insulin sliding scale, check  hemoglobin A1c. Carbohydrate modified diet.  CKD stage III Likely secondary to diabetic nephropathy and history of hypertension. Baseline creatinine about 1.3, presented with creatinine of 1.6. Has lower extremity edema elevate lower extremities and continue Lasix. Creatinine improving with Lasix administration.   History of PE/DVT Has history of PE/DVT reportedly last year, she was been on Eliquis for about 5 months. Doppler repeated showed no evidence of DVT, no indication for anticoagulation, on prophylactic dose of SubQ heparin.  Code Status: Full code Family Communication: Plan discussed with the patient. Disposition Plan: Remains inpatient   Consultants:  None  Procedures:  None  Antibiotics:  Vancomycin and Primaxin   Objective: Filed Vitals:   09/06/14 0455  BP: 163/62  Pulse: 73  Temp: 97.9 F (36.6 C)  Resp: 17    Intake/Output Summary (Last 24 hours) at 09/06/14 0925 Last data filed at 09/06/14 0521  Gross per 24 hour  Intake    360 ml  Output   4550 ml  Net  -4190 ml   Filed Weights   09/04/14 0616 09/05/14 0500 09/06/14 0455  Weight: 163 kg (359 lb 5.6 oz) 162.2 kg (357 lb 9.4 oz) 161.4 kg (355 lb 13.2 oz)    Exam: General: Alert and awake, oriented x3, not in any acute distress. HEENT: anicteric sclera, pupils reactive to light and accommodation, EOMI CVS: S1-S2 clear, no murmur rubs or gallops Chest: clear to auscultation bilaterally, no wheezing, rales or rhonchi Abdomen: soft nontender, nondistended, normal bowel sounds, no organomegaly Extremities: no cyanosis, clubbing or edema noted bilaterally Neuro: Cranial nerves II-XII intact, no focal neurological deficits  Data Reviewed: Basic Metabolic Panel:  Recent Labs Lab 09/02/14 1952 09/03/14 0407 09/04/14 0410 09/04/14 2059 09/05/14 0400 09/06/14 0645  NA 139 133* 139  --  135 135  K 4.7 4.3 4.4  --  4.1 4.2  CL 106 106 105  --  107 106  CO2 22 20 25   --  25 26  GLUCOSE 166*  185* 131*  --  136* 117*  BUN 35* 33* 38*  --  42* 38*  CREATININE 1.66* 1.65* 1.71* 1.71* 1.57* 1.40*  CALCIUM 8.6 7.8* 8.1*  --  7.8* 7.8*   Liver Function Tests:  Recent Labs Lab 09/03/14 0407  AST 22  ALT 14  ALKPHOS 103  BILITOT 0.6  PROT 7.5  ALBUMIN 2.6*   No results for input(s): LIPASE, AMYLASE in the last 168 hours. No results for input(s): AMMONIA in the last 168 hours. CBC:  Recent Labs Lab 09/02/14 1952 09/03/14 0407 09/04/14 0410 09/05/14 0400 09/06/14 0645  WBC 12.7* 14.8* 13.0* 10.5 7.2  NEUTROABS 11.6*  --   --   --   --   HGB 10.1* 8.9* 8.2* 8.1* 8.0*  HCT 31.9* 28.5* 26.7* 25.3* 25.4*  MCV 90.4 89.3 89.3 89.4 88.8  PLT 238 PLATELET CLUMPS NOTED ON SMEAR, COUNT APPEARS ADEQUATE 193 196 198   Cardiac Enzymes: No results for input(s): CKTOTAL, CKMB, CKMBINDEX, TROPONINI in the last 168 hours. BNP (last 3 results)  Recent Labs  07/11/14 1959  PROBNP 2482.0*   CBG:  Recent Labs Lab 09/05/14 0745 09/05/14 1138 09/05/14 1819 09/05/14 2147 09/06/14 0744  GLUCAP 119* 153* 135* 153* 106*    Micro Recent Results (from the past 240 hour(s))  Culture, blood (routine x 2)     Status: None   Collection Time: 09/02/14  7:52 PM  Result Value Ref Range Status   Specimen Description BLOOD RIGHT HAND  Final   Special Requests BOTTLES DRAWN AEROBIC AND ANAEROBIC 5ML  Final   Culture   Final    STREPTOCOCCUS GROUP C Note: Gram Stain Report Called to,Read Back By and Verified With: Lethea Killings RN 70P Performed at [facility]    Report Status 09/05/2014 FINAL  Final   Organism ID, Bacteria STREPTOCOCCUS GROUP C  Final      Susceptibility   Streptococcus group c - MIC (ETEST)*    PENICILLIN 0.032 SENSITIVE Sensitive     * STREPTOCOCCUS GROUP C  Culture, blood (routine x 2)     Status: None (Preliminary result)   Collection Time: 09/02/14  9:38 PM  Result Value Ref Range Status   Specimen Description BLOOD LEFT HAND  Final    Special Requests BOTTLES DRAWN AEROBIC AND ANAEROBIC 3CC  Final   Culture   Final           BLOOD CULTURE RECEIVED NO GROWTH TO DATE CULTURE WILL BE HELD FOR 5 DAYS BEFORE ISSUING A FINAL NEGATIVE REPORT Performed at [facility]    Report Status PENDING  Incomplete  MRSA PCR Screening     Status: Abnormal   Collection Time: 09/03/14  2:25 AM  Result Value Ref Range Status   MRSA by PCR POSITIVE (A) NEGATIVE Final    Comment:        The GeneXpert MRSA Assay (FDA approved for NASAL specimens only), is one component of a comprehensive MRSA colonization surveillance program. It is not intended to diagnose MRSA infection nor to guide or monitor treatment for MRSA infections. RESULT CALLED TO, READ BACK BY AND VERIFIED WITH: Donne Anon RN AT [phone] ON 01.08.16 BY SHUEA   Urine culture     Status: None   Collection Time: 09/03/14  11:20 AM  Result Value Ref Range Status   Specimen Description URINE, CLEAN CATCH  Final   Special Requests NONE  Final   Colony Count   Final    1,000 COLONIES/ML Performed at [facility]    Culture   Final    INSIGNIFICANT GROWTH Performed at [facility]    Report Status 09/04/2014 FINAL  Final     Studies: No results found.  Scheduled Meds: . allopurinol  100 mg Oral Daily  . amLODipine  10 mg Oral Daily  . Chlorhexidine Gluconate Cloth  6 each Topical Q0600  . cholecalciferol  1,000 Units Oral Daily  . furosemide  40 mg Intravenous BID  . gabapentin  300 mg Oral TID  . heparin subcutaneous  5,000 Units Subcutaneous 3 times per day  . imipenem-cilastatin  250 mg Intravenous Q6H  . insulin aspart  0-9 Units Subcutaneous TID WC  . isosorbide mononitrate  10 mg Oral BID  . metoprolol tartrate  75 mg Oral BID  . mupirocin ointment  1 application Nasal BID  . nutrition supplement (JUVEN)  1 packet Oral BID  . pantoprazole  40 mg Oral Daily  . polyethylene glycol  17 g Oral Daily  . senna  1 tablet Oral BID  . sodium  chloride  10-40 mL Intracatheter Q12H  . sodium chloride  3 mL Intravenous Q12H   Continuous Infusions:      Time spent: 35 minutes    [facility] A  [facility] Pager [phone] If 7PM-7AM, please contact night-coverage at [email or web address], password [facility] 09/06/2014, 9:25 AM  LOS: 4 days

## 2014-09-06 NOTE — Progress Notes (Signed)
Pt has refused CPAP QHS.  Risks discussed with the Pt.  Pt states she has not used CPAP QHS in a while and does not want it.  RT will continue to monitor and assess as needed

## 2014-09-06 NOTE — Progress Notes (Signed)
Patient ID: Kelly [name], female   DOB: February 28, 1946, 69 y.o.   MRN: [id number]         [facility]    Date of Admission:  09/02/2014           Day 4 imipenem   Principal Problem:   Recurrent cellulitis of lower leg Active Problems:   Sepsis   Streptococcal bacteremia   Essential hypertension   Diabetes mellitus type 2 with neurological manifestations   Acute on chronic renal failure   Morbid obesity   Pulmonary embolism   Anemia of chronic renal failure, stage 3 (moderate)   Diabetic neuropathy   Gout   DM type 2 causing CKD stage 3   Coronary atherosclerosis of native coronary artery   Open thigh wound   DVT (deep venous thrombosis)   Cellulitis, leg   . allopurinol  100 mg Oral Daily  . amLODipine  10 mg Oral Daily  . cephALEXin  500 mg Oral 4 times per day  . Chlorhexidine Gluconate Cloth  6 each Topical Q0600  . cholecalciferol  1,000 Units Oral Daily  . furosemide  40 mg Intravenous BID  . gabapentin  300 mg Oral TID  . heparin subcutaneous  5,000 Units Subcutaneous 3 times per day  . insulin aspart  0-9 Units Subcutaneous TID WC  . isosorbide mononitrate  10 mg Oral BID  . metoprolol tartrate  75 mg Oral BID  . multivitamins with iron  1 tablet Oral Daily  . mupirocin ointment  1 application Nasal BID  . nutrition supplement (JUVEN)  1 packet Oral BID  . pantoprazole  40 mg Oral Daily  . polyethylene glycol  17 g Oral Daily  . senna  1 tablet Oral BID  . sodium chloride  10-40 mL Intracatheter Q12H  . sodium chloride  3 mL Intravenous Q12H  . vitamin C  500 mg Oral Daily  . zinc sulfate  220 mg Oral Daily    Subjective: She is feeling better.  Review of Systems: Pertinent items are noted in HPI.  Past Medical History  Diagnosis Date  . Hypertension   . Hyperlipidemia   . CHF (congestive heart failure)   . GERD (gastroesophageal reflux disease)   . Depression   . Lymphedema of leg     left leg  . Pulmonary embolism  02/18/14    diagnosed at [facility] with CT angio chest  . Chronic indwelling Foley catheter   . Heart murmur     "just found out I have one" (04/07/2014)  . DVT (deep venous thrombosis) 01/2014    LLE; "went from my leg to my lungs"  . Pneumonia 1990's X 2  . Sleep apnea     doesn't use cpap machine or 02 at night (04/07/2014)  . Anemia   . History of blood transfusion 1990's X 2    "when they did my surgeries"  . Migraines     "none for years now" (04/07/2014)  . Arthritis     "arms, legs" (04/07/2014)  . Gout   . Urinary incontinence   . Chronic kidney disease (CKD), stage III (moderate)     [name] 04/07/2014  . Neuropathy   . Diabetes mellitus     "at one time" (04/07/2014)    History  Substance Use Topics  . Smoking status: Former Smoker -- 1.00 packs/day for 33 years    Types: Cigarettes    Quit date: 08/27/1981  . Smokeless tobacco: Never Used  .  Alcohol Use: Yes     Comment: 04/07/2014 "used to drink; stopped in ~ 1983"    Family History  Problem Relation Age of Onset  . Emphysema Father   . Asthma Brother   . Heart disease Mother   . Stomach cancer Brother   . Heart disease Maternal Grandmother   . Diabetes Mother   . Diabetes Sister    Allergies  Allergen Reactions  . Bactrim [Sulfamethoxazole-Trimethoprim] Other (See Comments)    Hyperkalemia (July 2015)  . Penicillins Hives    Has taken cephalexin 06/2014    OBJECTIVE: Blood pressure 138/47, pulse 73, temperature 98.1 F (36.7 C), temperature source Oral, resp. rate 18, height 5\' 6"  (1.676 m), weight 355 lb 13.2 oz (161.4 kg), SpO2 98 %. General: She is smiling and in good spirits  Cor: Distant but regular S1 and S2 with no murmur Lower extremities: No change in chronic, hyperpigmented and scaly dermatitis. No acute cellulitis remaining   Lab Results Lab Results  Component Value Date   WBC 7.2 09/06/2014   HGB 8.0* 09/06/2014   HCT 25.4* 09/06/2014   MCV 88.8 09/06/2014   PLT 198 09/06/2014    Lab  Results  Component Value Date   CREATININE 1.40* 09/06/2014   BUN 38* 09/06/2014   NA 135 09/06/2014   K 4.2 09/06/2014   CL 106 09/06/2014   CO2 26 09/06/2014    Lab Results  Component Value Date   ALT 14 09/03/2014   AST 22 09/03/2014   ALKPHOS 103 09/03/2014   BILITOT 0.6 09/03/2014     Microbiology: Recent Results (from the past 240 hour(s))  Culture, blood (routine x 2)     Status: None   Collection Time: 09/02/14  7:52 PM  Result Value Ref Range Status   Specimen Description BLOOD RIGHT HAND  Final   Special Requests BOTTLES DRAWN AEROBIC AND ANAEROBIC 5ML  Final   Culture   Final    STREPTOCOCCUS GROUP C Note: Gram Stain Report Called to,Read Back By and Verified With: Lethea Killings RN 15P Performed at [facility]    Report Status 09/05/2014 FINAL  Final   Organism ID, Bacteria STREPTOCOCCUS GROUP C  Final      Susceptibility   Streptococcus group c - MIC (ETEST)*    PENICILLIN 0.032 SENSITIVE Sensitive     * STREPTOCOCCUS GROUP C  Culture, blood (routine x 2)     Status: None (Preliminary result)   Collection Time: 09/02/14  9:38 PM  Result Value Ref Range Status   Specimen Description BLOOD LEFT HAND  Final   Special Requests BOTTLES DRAWN AEROBIC AND ANAEROBIC 3CC  Final   Culture   Final           BLOOD CULTURE RECEIVED NO GROWTH TO DATE CULTURE WILL BE HELD FOR 5 DAYS BEFORE ISSUING A FINAL NEGATIVE REPORT Performed at [facility]    Report Status PENDING  Incomplete  MRSA PCR Screening     Status: Abnormal   Collection Time: 09/03/14  2:25 AM  Result Value Ref Range Status   MRSA by PCR POSITIVE (A) NEGATIVE Final    Comment:        The GeneXpert MRSA Assay (FDA approved for NASAL specimens only), is one component of a comprehensive MRSA colonization surveillance program. It is not intended to diagnose MRSA infection nor to guide or monitor treatment for MRSA infections. RESULT CALLED TO, READ BACK BY AND VERIFIED WITH: Donne Anon RN AT  0435 ON 01.08.16 BY SHUEA   Urine culture     Status: None   Collection Time: 09/03/14 11:20 AM  Result Value Ref Range Status   Specimen Description URINE, CLEAN CATCH  Final   Special Requests NONE  Final   Colony Count   Final    1,000 COLONIES/ML Performed at [facility]    Culture   Final    INSIGNIFICANT GROWTH Performed at [facility]    Report Status 09/04/2014 FINAL  Final    Assessment: She has responded well to antibiotic therapy for recurrent group C streptococcal cellulitis complicated by transient bacteremia. I called her pharmacy and she was recently placed on doxycycline. This is not likely to be very effective for strep so I will convert her to oral cephalexin and plan on 6 more days of therapy.   Plan: Change imipenem to cephalexin and treat for 6 more days  Remove PICC if okay with Dr. Hartford Poli I will sign off now   Michel Bickers, MD [facility] [place] [phone] pager   [phone] cell 09/06/2014, 4:47 PM

## 2014-09-06 NOTE — Progress Notes (Signed)
Physical Therapy Treatment Patient Details Name: Kelly [name] MRN: [id number] DOB: 13-Jan-1946 Today's Date: 09/21/2014    History of Present Illness 29-yo female admitted  09/02/13  bil LE pain/cellulitis.   PMHx:  chronic diastolic heart failure, hypertension, hyperlipidemia, diabetes mellitus type 2, obstructive sleep apnea, GERD, depression, chronic lymphedema, and indwelling Foley catheter. Recently had I/D  L thigh wound  in 6/15 with multiple complications  including PE. Patient 's leg wound being managed by Wound center.    PT Comments    Pt assisted with ambulating in hallway.  Pt reports likely d/c home tomorrow.  Follow Up Recommendations  Home health PT     Equipment Recommendations  None recommended by PT    Recommendations for Other Services       Precautions / Restrictions Precautions Precautions: Fall    Mobility  Bed Mobility Overal bed mobility: Needs Assistance Bed Mobility: Supine to Sit     Supine to sit: Min guard;HOB elevated     General bed mobility comments: increased time and effort however no physical assist provided  Transfers Overall transfer level: Needs assistance Equipment used: Rolling walker (2 wheeled) Transfers: Sit to/from Stand Sit to Stand: Min assist Stand pivot transfers: Min assist       General transfer comment: verbal cues for technique, assist to rise and control descent, wide BOS and rocking technique  Ambulation/Gait Ambulation/Gait assistance: Min guard Ambulation Distance (Feet): 90 Feet Assistive device: Rolling walker (2 wheeled) Gait Pattern/deviations: Step-through pattern;Wide base of support;Trunk flexed;Decreased stride length     General Gait Details: min/guard for safety, increased work of breathing however pt reports her baseline   Financial trader Rankin (Stroke Patients Only)       Balance                                    Cognition  Arousal/Alertness: Awake/alert Behavior During Therapy: WFL for tasks assessed/performed Overall Cognitive Status: Within Functional Limits for tasks assessed                      Exercises      General Comments        Pertinent Vitals/Pain Pain Assessment: 0-10 Pain Score: 2  Pain Location: LEs Pain Descriptors / Indicators: Aching Pain Intervention(s): Limited activity within patient's tolerance;Monitored during session;Repositioned    Home Living                      Prior Function            PT Goals (current goals can now be found in the care plan section) Progress towards PT goals: Progressing toward goals    Frequency  Min 3X/week    PT Plan Current plan remains appropriate    Co-evaluation             End of Session   Activity Tolerance: Patient tolerated treatment well Patient left: with call bell/phone within reach;in chair     Time: 1448-1856 PT Time Calculation (min) (ACUTE ONLY): 15 min  Charges:  $Gait Training: 8-22 mins                    G Codes:      Denesia Donelan,KATHrine E 2014/09/21, 1:05 PM Carmelia Bake, PT, DPT 09-21-14 Pager: [phone]

## 2014-09-07 LAB — BASIC METABOLIC PANEL
ANION GAP: 7 (ref 5–15)
BUN: 34 mg/dL — ABNORMAL HIGH (ref 6–23)
CO2: 28 mmol/L (ref 19–32)
Calcium: 8.2 mg/dL — ABNORMAL LOW (ref 8.4–10.5)
Chloride: 105 mEq/L (ref 96–112)
Creatinine, Ser: 1.37 mg/dL — ABNORMAL HIGH (ref 0.50–1.10)
GFR calc Af Amer: 45 mL/min — ABNORMAL LOW (ref >90)
GFR, EST NON AFRICAN AMERICAN: 39 mL/min — AB (ref >90)
Glucose, Bld: 180 mg/dL — ABNORMAL HIGH (ref 70–99)
POTASSIUM: 4.1 mmol/L (ref 3.5–5.1)
SODIUM: 140 mmol/L (ref 135–145)

## 2014-09-07 LAB — PREALBUMIN: Prealbumin: 5.9 mg/dL — ABNORMAL LOW (ref 17.0–34.0)

## 2014-09-07 LAB — CBC
HEMATOCRIT: 24.9 % — AB (ref 36.0–46.0)
HEMOGLOBIN: 7.8 g/dL — AB (ref 12.0–15.0)
MCH: 27.9 pg (ref 26.0–34.0)
MCHC: 31.3 g/dL (ref 30.0–36.0)
MCV: 88.9 fL (ref 78.0–100.0)
Platelets: 214 10*3/uL (ref 150–400)
RBC: 2.8 MIL/uL — AB (ref 3.87–5.11)
RDW: 15.8 % — ABNORMAL HIGH (ref 11.5–15.5)
WBC: 7 10*3/uL (ref 4.0–10.5)

## 2014-09-07 LAB — GLUCOSE, CAPILLARY
Glucose-Capillary: 112 mg/dL — ABNORMAL HIGH (ref 70–99)
Glucose-Capillary: 144 mg/dL — ABNORMAL HIGH (ref 70–99)
Glucose-Capillary: 151 mg/dL — ABNORMAL HIGH (ref 70–99)
Glucose-Capillary: 119 mg/dL — ABNORMAL HIGH (ref 70–99)

## 2014-09-07 MED ORDER — LEVOFLOXACIN 750 MG PO TABS
750.0000 mg | ORAL_TABLET | Freq: Every day | ORAL | Status: DC
  Filled 2014-09-07: qty 1

## 2014-09-07 MED ORDER — CEPHALEXIN 500 MG PO CAPS: 500.0000 mg | ORAL_CAPSULE | Freq: Four times a day (QID) | ORAL | Status: DC

## 2014-09-07 MED ORDER — SACCHAROMYCES BOULARDII 250 MG PO CAPS
250.0000 mg | ORAL_CAPSULE | Freq: Two times a day (BID) | ORAL | Status: DC
  Administered 2014-09-07 – 2014-09-09 (×5): 250 mg via ORAL
  Filled 2014-09-07 (×9): qty 1

## 2014-09-07 MED ORDER — LEVOFLOXACIN IN D5W 750 MG/150ML IV SOLN
750.0000 mg | INTRAVENOUS | Status: DC
  Administered 2014-09-07 – 2014-09-08 (×2): 750 mg via INTRAVENOUS
  Filled 2014-09-07 (×3): qty 150

## 2014-09-07 NOTE — Progress Notes (Signed)
Pt refused CPAP QHS.  RT will continue to monitor.

## 2014-09-07 NOTE — Discharge Summary (Signed)
Physician Discharge Summary  Kelly [name] [id number]:[id number] DOB: May 31, 1946 DOA: 09/02/2014  PCP: Alvester Chou, NP  Admit date: 09/02/2014 Discharge date: 09/07/2014  Time spent: 40 minutes  Recommendations for Outpatient Follow-up:  1. Follow-up with primary care physician within one week.  Discharge Diagnoses:  Principal Problem:   Recurrent cellulitis of lower leg Active Problems:   Essential hypertension   Diabetes mellitus type 2 with neurological manifestations   Acute on chronic renal failure   Morbid obesity   Pulmonary embolism   Anemia of chronic renal failure, stage 3 (moderate)   Diabetic neuropathy   Gout   DM type 2 causing CKD stage 3   Coronary atherosclerosis of native coronary artery   Sepsis   Open thigh wound   Streptococcal bacteremia   DVT (deep venous thrombosis)   Cellulitis, leg   Discharge Condition: Stable  Diet recommendation: Heart Healthy  Filed Weights   09/04/14 0616 09/05/14 0500 09/06/14 0455  Weight: 163 kg (359 lb 5.6 oz) 162.2 kg (357 lb 9.4 oz) 161.4 kg (355 lb 13.2 oz)    History of present illness:  Kelly [name] is a 69 y.o. female with past medical history of Hypertension; Hyperlipidemia; diastolic CHF (congestive heart failure); GERD (gastroesophageal reflux disease); Depression; Lymphedema of leg; Pulmonary embolism (02/18/14); Chronic indwelling Foley catheter; DVT (deep venous thrombosis) (01/2014); Pneumonia (1990's X 2); Sleep apnea; Anemia; History of blood transfusion (1990's X 2); Migraines; Arthritis; Gout; Urinary incontinence; Chronic kidney disease (CKD), stage III (moderate); Neuropathy; and Diabetes mellitus, hx of group C Streptococcus bacteremia and severe sepsis due to left leg cellulitis Into left thigh in September, who presents with leg pain.   Patient is morbidly obese. She has chronic bilateral leg edema due to severe lymphedema. Patient reports that in the last week, her leg edema is getting worse. She started  having severe pain over bilateral upper thighs, right side is worse than the left, particularly over the right medial thigh. She has tenderness and redness over right medial thigh. Patient has fever with temperature of 103.4 at home. Patient has mild nausea, but no vomiting, abdominal pain or diarrhea. She dose not have SOB, chest pain, unilateral weakness.   Also note, patient has history of DVT and pulmonary embolism 01/2014. She is supposed to take Eliquis, however she has not been taking this medications since July 19, 2014.  Work up in the ED demonstrates leukocytosis with a WBC 12.7, worsening renal function with creatinine up from a 1.2-1.6-6, fever with a temperature 101 in the emergency room. Patient is admitted to inpatient for further evaluation and treatment.   Hospital Course:    Streptococcus group C bacteremia Blood culture grew Streptococcus group C, patient is allergic to penicillin was on Primaxin and vancomycin. Patient has right-sided PICC line placed for difficult IV access, placed on 09/03/14. Less likely to have persistent bacteremia or deep infection, likely recurrent bacteremia secondary to recurrent cellulitis. ID consulted and recommended to discontinue PICC line. Dr. Megan Salon recommended to discharge and cephalexin, discharged on cephalexin for 5 more days.  Cellulitis of the lower extremities, severe non-purulent Bilateral low-density cellulitis, patient has chronic lymphedema and recurrent cellulitis bilaterally. Had previous sepsis from cellulitis. Blood cultures obtained. Started initially on vancomycin and Primaxin, after the positivity of the blood culture vancomycin discontinued. ID recommended to discontinue IV antibiotics and discharge and treated with cephalexin for 5 more days.  Sepsis Presented with temp of 102 and heart rate of 118 the presence of cellulitis. Started  on broad-spectrum antibiotics, namely vancomycin and Primaxin. No aggressive IV  fluid hydration because of lower extremity edema and concerns of fluid overload. Sepsis physiology resolved at the time of discharge.  Diabetes mellitus type 2 Paced on insulin sliding scale of the time of admission. Hemoglobin A1c is 6.6 indicating tight glycemic control, continue home regimen.  CKD stage III Likely secondary to diabetic nephropathy and history of hypertension. Baseline creatinine about 1.3, presented with creatinine of 1.6. Was placed on higher dose of Lasix because of lower extremity edema. Patient placed on 40 mg IV twice a day, creatinine improved to 1.3, back to her home dose of 40 mg orally BID on discharge.  History of PE/DVT Has history of PE/DVT reportedly last year, she was been on Eliquis for about 5 months. she reports she was taken off of it. Doppler repeated showed no evidence of DVT, no indication for anticoagulation.    Procedures:  None  Consultations:  None  Discharge Exam: Filed Vitals:   09/07/14 0713  BP: 156/57  Pulse: 77  Temp: 98.9 F (37.2 C)  Resp: 18   General: Alert and awake, oriented x3, not in any acute distress. HEENT: anicteric sclera, pupils reactive to light and accommodation, EOMI CVS: S1-S2 clear, no murmur rubs or gallops Chest: clear to auscultation bilaterally, no wheezing, rales or rhonchi Abdomen: soft nontender, nondistended, normal bowel sounds, no organomegaly Extremities: Bilateral lower extremity edema, stasis dermatitis, chronic skin changes bilaterally Neuro: Cranial nerves II-XII intact, no focal neurological deficits  Discharge Instructions   Discharge Instructions    Diet - low sodium heart healthy    Complete by:  As directed      Increase activity slowly    Complete by:  As directed           Current Discharge Medication List    CONTINUE these medications which have CHANGED   Details  cephALEXin (KEFLEX) 500 MG capsule Take 1 capsule (500 mg total) by mouth 4 (four) times daily. Qty: 20  capsule, Refills: 0      CONTINUE these medications which have NOT CHANGED   Details  acetaminophen (TYLENOL) 325 MG tablet Take 650 mg by mouth every 6 (six) hours as needed for fever.    allopurinol (ZYLOPRIM) 100 MG tablet Take 100 mg by mouth daily.     cholecalciferol (VITAMIN D) 1000 UNITS tablet Take 1,000 Units by mouth daily.    esomeprazole (NEXIUM) 40 MG capsule Take 40 mg by mouth daily before breakfast. Take on an empty stomach    furosemide (LASIX) 40 MG tablet Take 40 mg by mouth 2 (two) times daily.     gabapentin (NEURONTIN) 300 MG capsule Take 300 mg by mouth 3 (three) times daily.    isosorbide mononitrate (ISMO,MONOKET) 10 MG tablet Take 10 mg by mouth 2 (two) times daily.    metoprolol tartrate (LOPRESSOR) 25 MG tablet Take 3 tablets (75 mg total) by mouth 2 (two) times daily. Qty: 180 tablet, Refills: 0    Nutritional Supplements (JUVEN NUTRIVIGOR) PACK Take 1 Container by mouth 2 (two) times daily.    Oxycodone HCl 10 MG TABS Take 1 tablet (10 mg total) by mouth every 4 (four) hours as needed (moderate pain). Qty: 180 tablet, Refills: 0    polyethylene glycol (MIRALAX / GLYCOLAX) packet Take 17 g by mouth daily. Mix with 4-6 oz liquid    promethazine (PHENERGAN) 25 MG tablet Take 25 mg by mouth every 6 (six) hours as needed for nausea  or vomiting.    senna (SENOKOT) 8.6 MG TABS tablet Take 1 tablet (8.6 mg total) by mouth 2 (two) times daily. Qty: 120 each, Refills: 0    zolpidem (AMBIEN) 5 MG tablet Take 1 tablet (5 mg total) by mouth at bedtime as needed for sleep. Qty: 30 tablet, Refills: 5      STOP taking these medications     apixaban (ELIQUIS) 5 MG TABS tablet        Allergies  Allergen Reactions  . Bactrim [Sulfamethoxazole-Trimethoprim] Other (See Comments)    Hyperkalemia (July 2015)  . Penicillins Hives    Has taken cephalexin 06/2014      The results of significant diagnostics from this hospitalization (including imaging,  microbiology, ancillary and laboratory) are listed below for reference.    Significant Diagnostic Studies: US Renal  09/02/2014   CLINICAL DATA:  Acute kidney injury.  EXAM: RENAL/URINARY TRACT ULTRASOUND COMPLETE  COMPARISON:  None.  FINDINGS: Technically limited exam due to body habitus.  Right Kidney:  Length: 12.8 Cm. Echogenicity within normal limits. No mass or hydronephrosis visualized.  Left Kidney:  Length: 12.2 cm. Echogenicity within normal limits. No mass or hydronephrosis visualized.  Bladder:  Appears normal for degree of bladder distention. Both ureteral jets are seen.  IMPRESSION: No hydronephrosis or obstructive uropathy.   Electronically Signed   By: Jeb Levering M.D.   On: 09/02/2014 23:58    Microbiology: Recent Results (from the past 240 hour(s))  Culture, blood (routine x 2)     Status: None   Collection Time: 09/02/14  7:52 PM  Result Value Ref Range Status   Specimen Description BLOOD RIGHT HAND  Final   Special Requests BOTTLES DRAWN AEROBIC AND ANAEROBIC 5ML  Final   Culture   Final    STREPTOCOCCUS GROUP C Note: Gram Stain Report Called to,Read Back By and Verified With: Lethea Killings RN 71P Performed at [facility]    Report Status 09/05/2014 FINAL  Final   Organism ID, Bacteria STREPTOCOCCUS GROUP C  Final      Susceptibility   Streptococcus group c - MIC (ETEST)*    PENICILLIN 0.032 SENSITIVE Sensitive     * STREPTOCOCCUS GROUP C  Culture, blood (routine x 2)     Status: None (Preliminary result)   Collection Time: 09/02/14  9:38 PM  Result Value Ref Range Status   Specimen Description BLOOD LEFT HAND  Final   Special Requests BOTTLES DRAWN AEROBIC AND ANAEROBIC 3CC  Final   Culture   Final           BLOOD CULTURE RECEIVED NO GROWTH TO DATE CULTURE WILL BE HELD FOR 5 DAYS BEFORE ISSUING A FINAL NEGATIVE REPORT Performed at [facility]    Report Status PENDING  Incomplete  MRSA PCR Screening     Status: Abnormal   Collection  Time: 09/03/14  2:25 AM  Result Value Ref Range Status   MRSA by PCR POSITIVE (A) NEGATIVE Final    Comment:        The GeneXpert MRSA Assay (FDA approved for NASAL specimens only), is one component of a comprehensive MRSA colonization surveillance program. It is not intended to diagnose MRSA infection nor to guide or monitor treatment for MRSA infections. RESULT CALLED TO, READ BACK BY AND VERIFIED WITH: Donne Anon RN AT [phone] ON 01.08.16 BY SHUEA   Urine culture     Status: None   Collection Time: 09/03/14 11:20 AM  Result Value Ref Range Status  Specimen Description URINE, CLEAN CATCH  Final   Special Requests NONE  Final   Colony Count   Final    1,000 COLONIES/ML Performed at [facility]    Culture   Final    INSIGNIFICANT GROWTH Performed at [facility]    Report Status 09/04/2014 FINAL  Final     Labs: Basic Metabolic Panel:  Recent Labs Lab 09/03/14 0407 09/04/14 0410 09/04/14 2059 09/05/14 0400 09/06/14 0645 09/07/14 0420  NA 133* 139  --  135 135 140  K 4.3 4.4  --  4.1 4.2 4.1  CL 106 105  --  107 106 105  CO2 20 25  --  25 26 28   GLUCOSE 185* 131*  --  136* 117* 180*  BUN 33* 38*  --  42* 38* 34*  CREATININE 1.65* 1.71* 1.71* 1.57* 1.40* 1.37*  CALCIUM 7.8* 8.1*  --  7.8* 7.8* 8.2*   Liver Function Tests:  Recent Labs Lab 09/03/14 0407  AST 22  ALT 14  ALKPHOS 103  BILITOT 0.6  PROT 7.5  ALBUMIN 2.6*   No results for input(s): LIPASE, AMYLASE in the last 168 hours. No results for input(s): AMMONIA in the last 168 hours. CBC:  Recent Labs Lab 09/02/14 1952 09/03/14 0407 09/04/14 0410 09/05/14 0400 09/06/14 0645 09/07/14 0420  WBC 12.7* 14.8* 13.0* 10.5 7.2 7.0  NEUTROABS 11.6*  --   --   --   --   --   HGB 10.1* 8.9* 8.2* 8.1* 8.0* 7.8*  HCT 31.9* 28.5* 26.7* 25.3* 25.4* 24.9*  MCV 90.4 89.3 89.3 89.4 88.8 88.9  PLT 238 PLATELET CLUMPS NOTED ON SMEAR, COUNT APPEARS ADEQUATE 193 196 198 214   Cardiac  Enzymes: No results for input(s): CKTOTAL, CKMB, CKMBINDEX, TROPONINI in the last 168 hours. BNP: BNP (last 3 results)  Recent Labs  07/11/14 1959  PROBNP 2482.0*   CBG:  Recent Labs Lab 09/06/14 0744 09/06/14 1146 09/06/14 1701 09/06/14 2148 09/07/14 0736  GLUCAP 106* 154* 161* 132* 112*       Signed:  Jahara Dail A  [facility] 09/07/2014, 10:40 AM

## 2014-09-07 NOTE — Progress Notes (Signed)
Occupational Therapy Treatment Patient Details Name: Kelly [name] MRN: [id number] DOB: [birth year] Today's Date: 09/07/2014    History of present illness 34-yo female admitted  09/02/13  bil LE pain/cellulitis.   PMHx:  chronic diastolic heart failure, hypertension, hyperlipidemia, diabetes mellitus type 2, obstructive sleep apnea, GERD, depression, chronic lymphedema, and indwelling Foley catheter. Recently had I/D  L thigh wound  in 6/15 with multiple complications  including PE. Patient 's leg wound being managed by Wound center.   OT comments  Pt supposed to be d/c home today. Assisted to the bathroom to void and have BM. Also focused on safety recommendations with use of walker and during ADL. Pt states she has family that can assist. Pt with shoes that slip on but that dont fully go around heel so advised pt to be cautious about her footwear so she doesn't trip. Will follow to progress ADL if here after today.   Follow Up Recommendations  Home health OT;Supervision/Assistance - 24 hour    Equipment Recommendations  None recommended by OT    Recommendations for Other Services      Precautions / Restrictions Precautions Precautions: Fall       Mobility Bed Mobility Overal bed mobility: Needs Assistance Bed Mobility: Supine to Sit     Supine to sit: Min guard;HOB elevated     General bed mobility comments: increased time and effort. pt states she has a hospital bed.  Transfers Overall transfer level: Needs assistance Equipment used: Rolling walker (2 wheeled) Transfers: Sit to/from Stand Sit to Stand: Min guard         General transfer comment: uses momentum to stand. verbal cues.    Balance                                   ADL       Grooming: Wash/dry hands;Min guard;Standing               Lower Body Dressing: Minimal assistance;Sit to/from stand   Toilet Transfer: Min guard;Ambulation;Comfort height toilet;Grab bars;RW   Toileting-  Water quality scientist and Hygiene: Min guard;Sit to/from stand         General ADL Comments: Pt able to stand but has to use momentum and requires increased time. Note open skin area on back of R LE so informed nursing. Pt states she has a reacher at home and slide on shoes. She required min assist for underwear without reacher today. Discussed several safety considerations as pt tends to push walker aside to step around to the sink and explained for safety it is better to stay inside of walker even at the sink. She also tends to steer walker with one hand after toielting due to not wanting to get walker handle dirty. Explained that walker can always be cleaned but she needs to hold walker with BOTH hands for safety. Advised pt to don clothing over R LE first as she states this leg is stiffer and gives her more difficulty. Pt waiting on clothing to get here but did don underwear that she had here. Pt states her toilet is higher at home and she has a grab bar but a 3in1 if needed also. Discussed use of splash guard as pt states whenever she uses a 3in1 the urine gets on the floor.       Vision  Perception     Praxis      Cognition   Behavior During Therapy: WFL for tasks assessed/performed Overall Cognitive Status: Within Functional Limits for tasks assessed                       Extremity/Trunk Assessment               Exercises     Shoulder Instructions       General Comments      Pertinent Vitals/ Pain       Pain Assessment: No/denies pain  Home Living                                          Prior Functioning/Environment              Frequency Min 2X/week     Progress Toward Goals  OT Goals(current goals can now be found in the care plan section)  Progress towards OT goals: Progressing toward goals     Plan Discharge plan remains appropriate    Co-evaluation                 End of Session  Equipment Utilized During Treatment: Rolling walker   Activity Tolerance Patient tolerated treatment well   Patient Left with call bell/phone within reach (EOB)   Nurse Communication          Time: 7425-9563 OT Time Calculation (min): 43 min  Charges: OT General Charges $OT Visit: 1 Procedure OT Treatments $Self Care/Home Management : 8-22 mins $Therapeutic Activity: 23-37 mins  Jules Schick  [phone] 09/07/2014, 1:24 PM

## 2014-09-07 NOTE — Progress Notes (Signed)
CARE MANAGEMENT NOTE 09/07/2014  Patient:  Kelly [name], Kelly [name]   Account Number:  [id number]  Date Initiated:  09/05/2014  Documentation initiated by:  [facility]  Subjective/Objective Assessment:   Cellulitis, leg     Action/Plan:   from home with [facility]   Anticipated DC Date:  09/07/2014   Anticipated DC Plan:  [place]  CM consult      [facility]   Choice offered to / List presented to:  C-1 Patient        [place] arranged  HH-1 RN  [place]   Status of service:  In process, will continue to follow Medicare Important Message given?  YES (If response is "NO", the following Medicare IM given date fields will be blank) Date Medicare IM given:  09/05/2014 Medicare IM given by:  [facility] Date Additional Medicare IM given:   Additional Medicare IM given by:    Discharge Disposition:    Per UR Regulation:    If discussed at Long Length of Stay Meetings, dates discussed:   09/07/2014    Comments:  09/07/14 MMcGibboney, RN, BSN Pt states she was active with [facility]PT [facility] and will continue with them at discharge. [facility] called [phone]  and faxed [phone].  09/05/2014 1742 NCM spoke to pt and states she is active with [facility]. Waiting final dc orders. Jonnie Finner RN CCM Case Mgmt phone [phone]

## 2014-09-07 NOTE — Significant Event (Signed)
I was called by RN Mrs. Estill Bamberg that patient developed nausea, vomited once, abdominal pain and started to have diarrhea. This happened after I saw the patient and completed her discharge paperwork. Patient was waiting for her ride to come, then she started to have the above-mentioned symptoms. Started on Keflex yesterday, stop Keflex, start IV levofloxacin. Continue isolation, rule out C. difficile colitis and postpone discharge.  Birdie Hopes Pager: [phone] 09/07/2014, 2:28 PM

## 2014-09-07 NOTE — Progress Notes (Signed)
Per MD order, PICC line removed. Cath intact at 46cm. Vaseline pressure gauze to site, pressure held x 2min. No bleeding to site. Pt instructed to keep dressing CDI x 24 hours. Avoid heavy lifting, pushing or pulling x 24 hours,  If bleeding occurs hold pressure, if bleeding does not stop contact MD or go to the ED. Pt does not have any questions. [employer]

## 2014-09-07 NOTE — Progress Notes (Signed)
ANTIBIOTIC CONSULT NOTE - INITIAL  Pharmacy Consult for levofloxacin Indication: S. pneumoniae bacteremia  Allergies  Allergen Reactions  . Bactrim [Sulfamethoxazole-Trimethoprim] Other (See Comments)    Hyperkalemia (July 2015)  . Penicillins Hives    Has taken cephalexin 06/2014    Patient Measurements: Height: 5\' 6"  (167.6 cm) Weight: (!) 355 lb 13.2 oz (161.4 kg) IBW/kg (Calculated) : 59.3 Adjusted Body Weight:   Vital Signs: Temp: 97.9 F (36.6 C) (01/12 1411) Temp Source: Oral (01/12 0713) BP: 153/63 mmHg (01/12 1411) Pulse Rate: 77 (01/12 1411) Intake/Output from previous day: 01/11 0701 - 01/12 0700 In: 680 [P.O.:480; IV Piggyback:200] Out: 4400 [Urine:4400] Intake/Output from this shift: Total I/O In: -  Out: 2650 [Urine:2650]  Labs:  Recent Labs  09/05/14 0400 09/06/14 0645 09/07/14 0420  WBC 10.5 7.2 7.0  HGB 8.1* 8.0* 7.8*  PLT 196 198 214  CREATININE 1.57* 1.40* 1.37*   Estimated Creatinine Clearance: 63 mL/min (by C-G formula based on Cr of 1.37). No results for input(s): VANCOTROUGH, VANCOPEAK, VANCORANDOM, GENTTROUGH, GENTPEAK, GENTRANDOM, TOBRATROUGH, TOBRAPEAK, TOBRARND, AMIKACINPEAK, AMIKACINTROU, AMIKACIN in the last 72 hours.   Microbiology: Recent Results (from the past 720 hour(s))  Culture, blood (routine x 2)     Status: None   Collection Time: 09/02/14  7:52 PM  Result Value Ref Range Status   Specimen Description BLOOD RIGHT HAND  Final   Special Requests BOTTLES DRAWN AEROBIC AND ANAEROBIC 5ML  Final   Culture   Final    STREPTOCOCCUS GROUP C Note: Gram Stain Report Called to,Read Back By and Verified With: Lethea Killings RN 69P Performed at [facility]    Report Status 09/05/2014 FINAL  Final   Organism ID, Bacteria STREPTOCOCCUS GROUP C  Final      Susceptibility   Streptococcus group c - MIC (ETEST)*    PENICILLIN 0.032 SENSITIVE Sensitive     * STREPTOCOCCUS GROUP C  Culture, blood (routine x 2)     Status:  None (Preliminary result)   Collection Time: 09/02/14  9:38 PM  Result Value Ref Range Status   Specimen Description BLOOD LEFT HAND  Final   Special Requests BOTTLES DRAWN AEROBIC AND ANAEROBIC 3CC  Final   Culture   Final           BLOOD CULTURE RECEIVED NO GROWTH TO DATE CULTURE WILL BE HELD FOR 5 DAYS BEFORE ISSUING A FINAL NEGATIVE REPORT Performed at [facility]    Report Status PENDING  Incomplete  MRSA PCR Screening     Status: Abnormal   Collection Time: 09/03/14  2:25 AM  Result Value Ref Range Status   MRSA by PCR POSITIVE (A) NEGATIVE Final    Comment:        The GeneXpert MRSA Assay (FDA approved for NASAL specimens only), is one component of a comprehensive MRSA colonization surveillance program. It is not intended to diagnose MRSA infection nor to guide or monitor treatment for MRSA infections. RESULT CALLED TO, READ BACK BY AND VERIFIED WITH: L. REINERT RN AT [phone] ON 01.08.16 BY SHUEA   Urine culture     Status: None   Collection Time: 09/03/14 11:20 AM  Result Value Ref Range Status   Specimen Description URINE, CLEAN CATCH  Final   Special Requests NONE  Final   Colony Count   Final    1,000 COLONIES/ML Performed at [facility]    Culture   Final    INSIGNIFICANT GROWTH Performed at [facility]  Report Status 09/04/2014 FINAL  Final    Medical History: Past Medical History  Diagnosis Date  . Hypertension   . Hyperlipidemia   . CHF (congestive heart failure)   . GERD (gastroesophageal reflux disease)   . Depression   . Lymphedema of leg     left leg  . Pulmonary embolism 02/18/14    diagnosed at [facility] with CT angio chest  . Chronic indwelling Foley catheter   . Heart murmur     "just found out I have one" (04/07/2014)  . DVT (deep venous thrombosis) 01/2014    LLE; "went from my leg to my lungs"  . Pneumonia 1990's X 2  . Sleep apnea     doesn't use cpap machine or 02 at night (04/07/2014)  . Anemia   . History  of blood transfusion 1990's X 2    "when they did my surgeries"  . Migraines     "none for years now" (04/07/2014)  . Arthritis     "arms, legs" (04/07/2014)  . Gout   . Urinary incontinence   . Chronic kidney disease (CKD), stage III (moderate)     [name] 04/07/2014  . Neuropathy   . Diabetes mellitus     "at one time" (04/07/2014)   Assessment: 69 yo morbidly obese female brought from home by EMS with cellulitis and open sore on right leg with increased swelling and pitting edema, malodorous urination. Temp 103.4 and chills upon arrival. Patient has history of chronic leg ulcer s/p wound VAC and recurrent hip and thigh cellulitis with hx group C strep infection and sepsis. Patient to be discharge 1/12 but developed N/V, abd pain, and diarrhea.  Orders to change PO cephalexin to levofloxacin  1/7 >> Vanc >> 1/9 1/8 >> Primaxin >> 1/11 1/11 >> cephalexin >> 1/12 1/12 >> levofloxacin >>  Tmax: improved, afebrile since 1/9 WBC: WNL since 1/10 Renal: SCr still improving.Hx CKD w/ baseline ~1.2.CrCl > 50ml/min Lactate: 2.03>1.8  1/7 blood x 2: 1 of 2 sets growing Group C Strep, S to PCN. 1/8 MRSA pca: positive 1/8 urine: insignificant growth  Goal of Therapy:  Dose for renal function and indication  Plan:   Levofloxacin 750mg  IV q24h  No further adjustment needed at this time  Doreene Eland, PharmD, BCPS.   Pager: [phone]  09/07/2014,2:29 PM

## 2014-09-08 LAB — CBC
HEMATOCRIT: 26.2 % — AB (ref 36.0–46.0)
HEMOGLOBIN: 8.1 g/dL — AB (ref 12.0–15.0)
MCH: 27.6 pg (ref 26.0–34.0)
MCHC: 30.9 g/dL (ref 30.0–36.0)
MCV: 89.1 fL (ref 78.0–100.0)
Platelets: 228 10*3/uL (ref 150–400)
RBC: 2.94 MIL/uL — AB (ref 3.87–5.11)
RDW: 15.7 % — AB (ref 11.5–15.5)
WBC: 8.9 10*3/uL (ref 4.0–10.5)

## 2014-09-08 LAB — BASIC METABOLIC PANEL
Anion gap: 8 (ref 5–15)
BUN: 29 mg/dL — AB (ref 6–23)
CALCIUM: 8.3 mg/dL — AB (ref 8.4–10.5)
CO2: 28 mmol/L (ref 19–32)
Chloride: 106 mEq/L (ref 96–112)
Creatinine, Ser: 1.3 mg/dL — ABNORMAL HIGH (ref 0.50–1.10)
GFR calc non Af Amer: 41 mL/min — ABNORMAL LOW (ref >90)
GFR, EST AFRICAN AMERICAN: 48 mL/min — AB (ref >90)
GLUCOSE: 126 mg/dL — AB (ref 70–99)
POTASSIUM: 3.7 mmol/L (ref 3.5–5.1)
SODIUM: 142 mmol/L (ref 135–145)

## 2014-09-08 LAB — GLUCOSE, CAPILLARY
GLUCOSE-CAPILLARY: 147 mg/dL — AB (ref 70–99)
GLUCOSE-CAPILLARY: 147 mg/dL — AB (ref 70–99)
Glucose-Capillary: 107 mg/dL — ABNORMAL HIGH (ref 70–99)

## 2014-09-08 NOTE — Progress Notes (Signed)
Physical Therapy Treatment Patient Details Name: DIAHN WAIDELICH MRN: [id number] DOB: 03-08-1946 Today's Date: 10-06-2014    History of Present Illness 38-yo female admitted  09/02/13  bil LE pain/cellulitis.   PMHx:  chronic diastolic heart failure, hypertension, hyperlipidemia, diabetes mellitus type 2, obstructive sleep apnea, GERD, depression, chronic lymphedema, and indwelling Foley catheter. Recently had I/D  L thigh wound  in 6/15 with multiple complications  including PE. Patient 's leg wound being managed by Wound center.    PT Comments    Pt assisted with ambulation in hallway.  Mobilizing well today and able to increase distance since last visit.  Pt hopeful for d/c home today or tomorrow.  Follow Up Recommendations  Home health PT     Equipment Recommendations  None recommended by PT    Recommendations for Other Services       Precautions / Restrictions Precautions Precautions: Fall    Mobility  Bed Mobility Overal bed mobility: Needs Assistance Bed Mobility: Supine to Sit;Sit to Supine     Supine to sit: Supervision;HOB elevated Sit to supine: Mod assist   General bed mobility comments: increased time and effort, assist for LEs back to bed, reports she usually needs assist  Transfers Overall transfer level: Needs assistance Equipment used: Rolling walker (2 wheeled) Transfers: Sit to/from Stand Sit to Stand: Supervision;Min guard         General transfer comment: uses momentum to stand, good hand placement, wide BOS  Ambulation/Gait Ambulation/Gait assistance: Min guard;Supervision Ambulation Distance (Feet): 140 Feet Assistive device: Rolling walker (2 wheeled) Gait Pattern/deviations: Step-through pattern;Decreased stride length;Wide base of support     General Gait Details: slow but steady pace, brought double wheel wide RW to room today per pt request   Stairs            Wheelchair Mobility    Modified Rankin (Stroke Patients Only)        Balance                                    Cognition Arousal/Alertness: Awake/alert Behavior During Therapy: WFL for tasks assessed/performed Overall Cognitive Status: Within Functional Limits for tasks assessed                      Exercises      General Comments        Pertinent Vitals/Pain Pain Assessment: No/denies pain    Home Living                      Prior Function            PT Goals (current goals can now be found in the care plan section) Progress towards PT goals: Progressing toward goals    Frequency  Min 3X/week    PT Plan Current plan remains appropriate    Co-evaluation             End of Session   Activity Tolerance: Patient tolerated treatment well Patient left: with call bell/phone within reach;in bed     Time: 1346-1416 PT Time Calculation (min) (ACUTE ONLY): 30 min  Charges:  $Gait Training: 23-37 mins                    G Codes:      Nerine Pulse,KATHrine E 10/06/14, 3:50 PM Carmelia Bake, PT, DPT 10-06-2014 Pager: [phone]

## 2014-09-08 NOTE — ED Provider Notes (Signed)
CSN: [id number]     Arrival date & time 09/02/14  1736 History   First MD Initiated Contact with Patient 09/02/14 1814     Chief Complaint  Patient presents with  . Cellulitis  . Fever  . Chills      HPI  She presents for evaluation of leg pain and redness and swelling. Patient has history of chronic lymphedema. Recently admitted to the hospital for a similar episode of cellulitis. After 89 at home with shakes and chills and right ears. Increasing redness pain and bilateral extremities left greater than right. Patient also complains of an odor to her urine and urinary frequency.  Past Medical History  Diagnosis Date  . Hypertension   . Hyperlipidemia   . CHF (congestive heart failure)   . GERD (gastroesophageal reflux disease)   . Depression   . Lymphedema of leg     left leg  . Pulmonary embolism 02/18/14    diagnosed at [facility] with CT angio chest  . Chronic indwelling Foley catheter   . Heart murmur     "just found out I have one" (04/07/2014)  . DVT (deep venous thrombosis) 01/2014    LLE; "went from my leg to my lungs"  . Pneumonia 1990's X 2  . Sleep apnea     doesn't use cpap machine or 02 at night (04/07/2014)  . Anemia   . History of blood transfusion 1990's X 2    "when they did my surgeries"  . Migraines     "none for years now" (04/07/2014)  . Arthritis     "arms, legs" (04/07/2014)  . Gout   . Urinary incontinence   . Chronic kidney disease (CKD), stage III (moderate)     [name] 04/07/2014  . Neuropathy   . Diabetes mellitus     "at one time" (04/07/2014)   Past Surgical History  Procedure Laterality Date  . Shoulder open rotator cuff repair Right ~ 2000  . Knee arthroscopy Right ?1980's  . Temporal artery biopsy / ligation Right ?1990's  . Colonoscopy with propofol  09/23/2012    Procedure: COLONOSCOPY WITH PROPOFOL;  Surgeon: Jerene Bears, MD;  Location: [facility];  Service: Gastroenterology;  Laterality: N/A;  . Panniculectomy Left 02/08/2014    w/wound  debridement @ medial thigh  . Irrigation and debridement abscess Left 02/24/2014    Procedure: MINOR INCISION AND DRAINAGE OF ABSCESS;  Surgeon: Theodoro Kos, DO;  Location: [facility];  Service: Plastics;  Laterality: Left;  . I&d extremity Left 03/03/2014    Procedure: IRRIGATION AND DEBRIDEMENT LEFT LEG WOUND AND PLACEMENT OF A CELL AND VAC;  Surgeon: Theodoro Kos, DO;  Location: [place];  Service: Plastics;  Laterality: Left;  . Incision and drainage of wound Left 03/16/2014    Procedure: IRRIGATION AND DEBRIDEMENT OF LEFT THIGH WOUND, APPLICATION ACELL;  Surgeon: Irene Limbo, MD;  Location: [place];  Service: Plastics;  Laterality: Left;  . Incision and drainage of wound Left 03/25/2014    Procedure: IRRIGATION AND DEBRIDEMENT OF LEFT LEG WOUND WITH PLACEMENT OF ACELL/VAC;  Surgeon: Theodoro Kos, DO;  Location: [place];  Service: Plastics;  Laterality: Left;  . Incision and drainage of wound Left 03/31/2014    Procedure: IRRIGATION AND DEBRIDEMENT LEFT LEG WOUND WITH PLACEMENT OF A-CELL/VAC;  Surgeon: Theodoro Kos, DO;  Location: [place];  Service: Plastics;  Laterality: Left;  . Cataract extraction w/ intraocular lens implant Right 2014  . Vena cava filter placement  02/2014  . Incision  and drainage of wound Left 04/07/2014    Procedure: IRRIGATION AND DEBRIDEMENT LEFT LEG WOUND WITH PLACEMENT OF A CELL AND VAC;  Surgeon: Theodoro Kos, DO;  Location: [place];  Service: Plastics;  Laterality: Left;  . Incision and drainage of wound Left 04/14/2014    Procedure: IRRIGATION AND DEBRIDEMENT WOUND;  Surgeon: Theodoro Kos, DO;  Location: [place];  Service: Plastics;  Laterality: Left;  . Application of a-cell of extremity Left 04/14/2014    Procedure: APPLICATION OF A-CELL OF EXTREMITY;  Surgeon: Theodoro Kos, DO;  Location: [place];  Service: Plastics;  Laterality: Left;  Marland Kitchen Minor application of wound vac Left 04/14/2014    Procedure: MINOR APPLICATION OF WOUND VAC;  Surgeon: Theodoro Kos, DO;  Location: [place];   Service: Plastics;  Laterality: Left;  . Incision and drainage of wound Left 04/21/2014    Procedure: IRRIGATION AND DEBRIDEMENT OF LEFT UPPER LEG WOUND WITH PLACEMENT OF ACELL/VAC;  Surgeon: Theodoro Kos, DO;  Location: [place];  Service: Plastics;  Laterality: Left;   Family History  Problem Relation Age of Onset  . Emphysema Father   . Asthma Brother   . Heart disease Mother   . Stomach cancer Brother   . Heart disease Maternal Grandmother   . Diabetes Mother   . Diabetes Sister    History  Substance Use Topics  . Smoking status: Former Smoker -- 1.00 packs/day for 33 years    Types: Cigarettes    Quit date: 08/27/1981  . Smokeless tobacco: Never Used  . Alcohol Use: Yes     Comment: 04/07/2014 "used to drink; stopped in ~ 1983"   OB History    No data available     Review of Systems  Constitutional: Positive for fever, chills, appetite change and fatigue. Negative for diaphoresis.  HENT: Negative for mouth sores, sore throat and trouble swallowing.   Eyes: Negative for visual disturbance.  Respiratory: Negative for cough, chest tightness, shortness of breath and wheezing.   Cardiovascular: Negative for chest pain.  Gastrointestinal: Negative for nausea, vomiting, abdominal pain, diarrhea and abdominal distention.  Endocrine: Negative for polydipsia, polyphagia and polyuria.  Genitourinary: Positive for dysuria. Negative for frequency and hematuria.  Musculoskeletal: Negative for gait problem.  Skin: Positive for color change and rash. Negative for pallor.  Neurological: Negative for dizziness, syncope, light-headedness and headaches.  Hematological: Does not bruise/bleed easily.  Psychiatric/Behavioral: Negative for behavioral problems and confusion.      Allergies  Bactrim and Penicillins  Home Medications   Prior to Admission medications   Medication Sig Start Date End Date Taking? Authorizing Provider  acetaminophen (TYLENOL) 325 MG tablet Take 650 mg by mouth  every 6 (six) hours as needed for fever.   Yes Historical Provider, MD  allopurinol (ZYLOPRIM) 100 MG tablet Take 100 mg by mouth daily.    Yes Historical Provider, MD  cholecalciferol (VITAMIN D) 1000 UNITS tablet Take 1,000 Units by mouth daily.   Yes Historical Provider, MD  esomeprazole (NEXIUM) 40 MG capsule Take 40 mg by mouth daily before breakfast. Take on an empty stomach   Yes Historical Provider, MD  furosemide (LASIX) 40 MG tablet Take 40 mg by mouth 2 (two) times daily.    Yes Historical Provider, MD  gabapentin (NEURONTIN) 300 MG capsule Take 300 mg by mouth 3 (three) times daily.   Yes Historical Provider, MD  isosorbide mononitrate (ISMO,MONOKET) 10 MG tablet Take 10 mg by mouth 2 (two) times daily.   Yes Historical Provider,  MD  metoprolol tartrate (LOPRESSOR) 25 MG tablet Take 3 tablets (75 mg total) by mouth 2 (two) times daily. 05/14/14  Yes Orson Eva, MD  Nutritional Supplements (JUVEN NUTRIVIGOR) PACK Take 1 Container by mouth 2 (two) times daily.   Yes Historical Provider, MD  Oxycodone HCl 10 MG TABS Take 1 tablet (10 mg total) by mouth every 4 (four) hours as needed (moderate pain). 07/18/14  Yes Theodis Blaze, MD  polyethylene glycol (MIRALAX / Floria Raveling) packet Take 17 g by mouth daily. Mix with 4-6 oz liquid   Yes Historical Provider, MD  promethazine (PHENERGAN) 25 MG tablet Take 25 mg by mouth every 6 (six) hours as needed for nausea or vomiting.   Yes Historical Provider, MD  senna (SENOKOT) 8.6 MG TABS tablet Take 1 tablet (8.6 mg total) by mouth 2 (two) times daily. 04/09/14  Yes Marianne L [name], PA-C  zolpidem (AMBIEN) 5 MG tablet Take 1 tablet (5 mg total) by mouth at bedtime as needed for sleep. 07/18/14  Yes Theodis Blaze, MD  apixaban (ELIQUIS) 5 MG TABS tablet Take 1 tablet (5 mg total) by mouth 2 (two) times daily. Patient not taking: Reported on 09/02/2014 05/14/14   Orson Eva, MD  cephALEXin (KEFLEX) 500 MG capsule Take 1 capsule (500 mg total) by mouth 4 (four)  times daily. 09/07/14   Mutaz Elmahi, MD   BP 153/60 mmHg  Pulse 63  Temp(Src) 98 F (36.7 C) (Oral)  Resp 18  Ht 5\' 6"  (1.676 m)  Wt 355 lb 13.2 oz (161.4 kg)  BMI 57.46 kg/m2  SpO2 99% Physical Exam  Constitutional: She is oriented to person, place, and time. She appears well-developed and well-nourished. No distress.  HENT:  Head: Normocephalic.  Eyes: Conjunctivae are normal. Pupils are equal, round, and reactive to light. No scleral icterus.  Neck: Normal range of motion. Neck supple. No thyromegaly present.  Cardiovascular: Normal rate and regular rhythm.  Exam reveals no gallop and no friction rub.   No murmur heard. Pulmonary/Chest: Effort normal and breath sounds normal. No respiratory distress. She has no wheezes. She has no rales.  Abdominal: Soft. Bowel sounds are normal. She exhibits no distension. There is no tenderness. There is no rebound.  Musculoskeletal: Normal range of motion.  Neurological: She is alert and oriented to person, place, and time.  Skin: Skin is warm and dry. No rash noted.  Arben Packman lymphedema bilateral lower extremities left greater than right. Erythema and induration of the skin of the left lower extremity with no obvious abscess formation on exam. Excellent perfusion to the legs.  Psychiatric: She has a normal mood and affect. Her behavior is normal.    ED Course  Procedures (including critical care time) Labs Review Labs Reviewed  MRSA PCR SCREENING - Abnormal; Notable for the following:    MRSA by PCR POSITIVE (*)    All other components within normal limits  CBC WITH DIFFERENTIAL - Abnormal; Notable for the following:    WBC 12.7 (*)    RBC 3.53 (*)    Hemoglobin 10.1 (*)    HCT 31.9 (*)    RDW 16.5 (*)    Neutrophils Relative % 91 (*)    Neutro Abs 11.6 (*)    Lymphocytes Relative 4 (*)    Lymphs Abs 0.5 (*)    All other components within normal limits  BASIC METABOLIC PANEL - Abnormal; Notable for the following:    Glucose, Bld 166  (*)    BUN  35 (*)    Creatinine, Ser 1.66 (*)    GFR calc non Af Amer 31 (*)    GFR calc Af Amer 36 (*)    All other components within normal limits  BRAIN NATRIURETIC PEPTIDE - Abnormal; Notable for the following:    B Natriuretic Peptide 507.4 (*)    All other components within normal limits  COMPREHENSIVE METABOLIC PANEL - Abnormal; Notable for the following:    Sodium 133 (*)    Glucose, Bld 185 (*)    BUN 33 (*)    Creatinine, Ser 1.65 (*)    Calcium 7.8 (*)    Albumin 2.6 (*)    GFR calc non Af Amer 31 (*)    GFR calc Af Amer 36 (*)    All other components within normal limits  CBC - Abnormal; Notable for the following:    WBC 14.8 (*)    RBC 3.19 (*)    Hemoglobin 8.9 (*)    HCT 28.5 (*)    RDW 16.6 (*)    All other components within normal limits  URINALYSIS, ROUTINE W REFLEX MICROSCOPIC - Abnormal; Notable for the following:    APPearance CLOUDY (*)    Protein, ur >300 (*)    All other components within normal limits  HEPARIN LEVEL (UNFRACTIONATED) - Abnormal; Notable for the following:    Heparin Unfractionated 0.26 (*)    All other components within normal limits  HEMOGLOBIN A1C - Abnormal; Notable for the following:    Hgb A1c MFr Bld 6.6 (*)    Mean Plasma Glucose 143 (*)    All other components within normal limits  GLUCOSE, CAPILLARY - Abnormal; Notable for the following:    Glucose-Capillary 166 (*)    All other components within normal limits  GLUCOSE, CAPILLARY - Abnormal; Notable for the following:    Glucose-Capillary 208 (*)    All other components within normal limits  URINE MICROSCOPIC-ADD ON - Abnormal; Notable for the following:    Squamous Epithelial / LPF FEW (*)    Bacteria, UA MANY (*)    Casts HYALINE CASTS (*)    All other components within normal limits  GLUCOSE, CAPILLARY - Abnormal; Notable for the following:    Glucose-Capillary 141 (*)    All other components within normal limits  CBC - Abnormal; Notable for the following:    WBC  13.0 (*)    RBC 2.99 (*)    Hemoglobin 8.2 (*)    HCT 26.7 (*)    RDW 16.8 (*)    All other components within normal limits  BASIC METABOLIC PANEL - Abnormal; Notable for the following:    Glucose, Bld 131 (*)    BUN 38 (*)    Creatinine, Ser 1.71 (*)    Calcium 8.1 (*)    GFR calc non Af Amer 30 (*)    GFR calc Af Amer 35 (*)    All other components within normal limits  GLUCOSE, CAPILLARY - Abnormal; Notable for the following:    Glucose-Capillary 134 (*)    All other components within normal limits  GLUCOSE, CAPILLARY - Abnormal; Notable for the following:    Glucose-Capillary 126 (*)    All other components within normal limits  CREATININE, SERUM - Abnormal; Notable for the following:    Creatinine, Ser 1.71 (*)    GFR calc non Af Amer 30 (*)    GFR calc Af Amer 35 (*)    All other components within normal limits  GLUCOSE, CAPILLARY - Abnormal; Notable for the following:    Glucose-Capillary 168 (*)    All other components within normal limits  GLUCOSE, CAPILLARY - Abnormal; Notable for the following:    Glucose-Capillary 167 (*)    All other components within normal limits  CBC - Abnormal; Notable for the following:    RBC 2.83 (*)    Hemoglobin 8.1 (*)    HCT 25.3 (*)    RDW 16.3 (*)    All other components within normal limits  BASIC METABOLIC PANEL - Abnormal; Notable for the following:    Glucose, Bld 136 (*)    BUN 42 (*)    Creatinine, Ser 1.57 (*)    Calcium 7.8 (*)    GFR calc non Af Amer 33 (*)    GFR calc Af Amer 38 (*)    Anion gap 3 (*)    All other components within normal limits  GLUCOSE, CAPILLARY - Abnormal; Notable for the following:    Glucose-Capillary 125 (*)    All other components within normal limits  GLUCOSE, CAPILLARY - Abnormal; Notable for the following:    Glucose-Capillary 119 (*)    All other components within normal limits  GLUCOSE, CAPILLARY - Abnormal; Notable for the following:    Glucose-Capillary 153 (*)    All other  components within normal limits  CBC - Abnormal; Notable for the following:    RBC 2.86 (*)    Hemoglobin 8.0 (*)    HCT 25.4 (*)    RDW 16.0 (*)    All other components within normal limits  BASIC METABOLIC PANEL - Abnormal; Notable for the following:    Glucose, Bld 117 (*)    BUN 38 (*)    Creatinine, Ser 1.40 (*)    Calcium 7.8 (*)    GFR calc non Af Amer 38 (*)    GFR calc Af Amer 44 (*)    Anion gap 3 (*)    All other components within normal limits  GLUCOSE, CAPILLARY - Abnormal; Notable for the following:    Glucose-Capillary 135 (*)    All other components within normal limits  GLUCOSE, CAPILLARY - Abnormal; Notable for the following:    Glucose-Capillary 153 (*)    All other components within normal limits  GLUCOSE, CAPILLARY - Abnormal; Notable for the following:    Glucose-Capillary 106 (*)    All other components within normal limits  GLUCOSE, CAPILLARY - Abnormal; Notable for the following:    Glucose-Capillary 154 (*)    All other components within normal limits  PREALBUMIN - Abnormal; Notable for the following:    Prealbumin 5.9 (*)    All other components within normal limits  CBC - Abnormal; Notable for the following:    RBC 2.80 (*)    Hemoglobin 7.8 (*)    HCT 24.9 (*)    RDW 15.8 (*)    All other components within normal limits  BASIC METABOLIC PANEL - Abnormal; Notable for the following:    Glucose, Bld 180 (*)    BUN 34 (*)    Creatinine, Ser 1.37 (*)    Calcium 8.2 (*)    GFR calc non Af Amer 39 (*)    GFR calc Af Amer 45 (*)    All other components within normal limits  GLUCOSE, CAPILLARY - Abnormal; Notable for the following:    Glucose-Capillary 161 (*)    All other components within normal limits  GLUCOSE, CAPILLARY - Abnormal; Notable for the  following:    Glucose-Capillary 132 (*)    All other components within normal limits  GLUCOSE, CAPILLARY - Abnormal; Notable for the following:    Glucose-Capillary 112 (*)    All other components  within normal limits  GLUCOSE, CAPILLARY - Abnormal; Notable for the following:    Glucose-Capillary 144 (*)    All other components within normal limits  GLUCOSE, CAPILLARY - Abnormal; Notable for the following:    Glucose-Capillary 151 (*)    All other components within normal limits  CBC - Abnormal; Notable for the following:    RBC 2.94 (*)    Hemoglobin 8.1 (*)    HCT 26.2 (*)    RDW 15.7 (*)    All other components within normal limits  BASIC METABOLIC PANEL - Abnormal; Notable for the following:    Glucose, Bld 126 (*)    BUN 29 (*)    Creatinine, Ser 1.30 (*)    Calcium 8.3 (*)    GFR calc non Af Amer 41 (*)    GFR calc Af Amer 48 (*)    All other components within normal limits  GLUCOSE, CAPILLARY - Abnormal; Notable for the following:    Glucose-Capillary 119 (*)    All other components within normal limits  GLUCOSE, CAPILLARY - Abnormal; Notable for the following:    Glucose-Capillary 107 (*)    All other components within normal limits  CULTURE, BLOOD (ROUTINE X 2)  CULTURE, BLOOD (ROUTINE X 2)  URINE CULTURE  CLOSTRIDIUM DIFFICILE BY PCR  PROTIME-INR  UREA NITROGEN, URINE  CREATININE, URINE, RANDOM  APTT  LACTIC ACID, PLASMA  I-STAT CG4 LACTIC ACID, ED    Imaging Review No results found.   EKG Interpretation None      MDM   Final diagnoses:  AKI (acute kidney injury)  Edema    After cultures are obtained patient treated with vancomycin. Discussed the case tried hospitalist.  She is to be admitted for additional IV antibiotics.  Given Tylenol.  Decreased temperature. Does not show signs of frank sepsis at this point    Tanna Furry, MD 09/08/14 [phone]

## 2014-09-08 NOTE — Progress Notes (Signed)
Pt refused CPAP QHS.  RT will continue to monitor and assess as needed.

## 2014-09-08 NOTE — Progress Notes (Signed)
[facility] PROGRESS NOTE  Kelly [name] PVX:[id number] DOB: Jan 25, 1946 DOA: 09/02/2014 PCP: Alvester Chou, NP  Summary/subjective: I have seen and examined Kelly [name] at bedside and reviewed her chart. Appreciate ID. Kelly [name] is a 69 y.o. female with past medical history of Hypertension; Hyperlipidemia; diastolic CHF (congestive heart failure); GERD (gastroesophageal reflux disease); Depression; Lymphedema of leg; Pulmonary embolism (02/18/14); Chronic indwelling Foley catheter; DVT (deep venous thrombosis) (01/2014); Pneumonia (1990's X 2); Sleep apnea; Anemia; History of blood transfusion (1990's X 2); Migraines; Arthritis; Gout; Urinary incontinence; Chronic kidney disease (CKD), stage III (moderate); Neuropathy; and Diabetes mellitus, hx of group C Streptococcus bacteremia and severe sepsis due to left leg cellulitis Into left thigh in September, who presented with leg pain and was found to have Acute streptococcal cellulitis with bacteremia. She was to go home yesterday but developed diarrhea, holding d/c. The diarrhea has since stopped but she still feels sore in the stomach. She denies vomiting. She is tolerating Levaquin. Appreciate pharmacy. Will continue Levaquin. If she continues to do well, will d/c in am. Plan Streptococcus group C bacteremia/Cellulitis of the lower extremities, severe non-purulent Blood culture grew Streptococcus group C, patient is allergic to penicillin was on Primaxin and vancomycin. Dr. Megan Salon recommended to discharge on cephalexin,  Started on Levaquin yesterday after diarrhea episode. Will likely d/c on Levaquin as pcn allergy. Diabetes mellitus type 2 On insulin sliding scale at the time of admission. Hemoglobin A1c is 6.6 indicating tight glycemic control, continue home regimen.  CKD stage III Likely secondary to diabetic nephropathy and history of hypertension. Baseline creatinine about 1.3, presented with creatinine of 1.6. Was placed on higher  dose of Lasix because of lower extremity edema. Patient placed on 40 mg IV twice a day, creatinine improved to 1.3, back to her home dose of 40 mg orally BID on discharge.  History of PE/DVT Has history of PE/DVT reportedly last year, she was been on Eliquis for about 5 months. she reports she was taken off of it. Doppler repeated showed no evidence of DVT, no indication for anticoagulation. Code Status: Full code. Family Communication: Spoke with patient at bedside. Disposition Plan: Likely home with HHS in am.   Consultants:  ID  Procedures:  Picc line.  Antibiotics:  Levaquin 09/07/13>   Objective: Filed Vitals:   09/08/14 1529  BP: 151/66  Pulse: 68  Temp: 98.1 F (36.7 C)  Resp: 19    Intake/Output Summary (Last 24 hours) at 09/08/14 1745 Last data filed at 09/08/14 1529  Gross per 24 hour  Intake    358 ml  Output    800 ml  Net   -442 ml   Filed Weights   09/04/14 0616 09/05/14 0500 09/06/14 0455  Weight: 163 kg (359 lb 5.6 oz) 162.2 kg (357 lb 9.4 oz) 161.4 kg (355 lb 13.2 oz)    Exam:   General:  Sitting up eating.  Cardiovascular: RRR. S1S2 heard.  Respiratory: Good air entry bilaterally.  Abdomen: soft and non tender. Normal bowel sounds.  Musculoskeletal: Chronic dermatitis both legs.   Data Reviewed: Basic Metabolic Panel:  Recent Labs Lab 09/04/14 0410 09/04/14 2059 09/05/14 0400 09/06/14 0645 09/07/14 0420 09/08/14 0400  NA 139  --  135 135 140 142  K 4.4  --  4.1 4.2 4.1 3.7  CL 105  --  107 106 105 106  CO2 25  --  25 26 28 28   GLUCOSE 131*  --  136* 117* 180* 126*  BUN 38*  --  42* 38* 34* 29*  CREATININE 1.71* 1.71* 1.57* 1.40* 1.37* 1.30*  CALCIUM 8.1*  --  7.8* 7.8* 8.2* 8.3*   Liver Function Tests:  Recent Labs Lab 09/03/14 0407  AST 22  ALT 14  ALKPHOS 103  BILITOT 0.6  PROT 7.5  ALBUMIN 2.6*   No results for input(s): LIPASE, AMYLASE in the last 168 hours. No results for input(s): AMMONIA in the last  168 hours. CBC:  Recent Labs Lab 09/02/14 1952  09/04/14 0410 09/05/14 0400 09/06/14 0645 09/07/14 0420 09/08/14 0400  WBC 12.7*  < > 13.0* 10.5 7.2 7.0 8.9  NEUTROABS 11.6*  --   --   --   --   --   --   HGB 10.1*  < > 8.2* 8.1* 8.0* 7.8* 8.1*  HCT 31.9*  < > 26.7* 25.3* 25.4* 24.9* 26.2*  MCV 90.4  < > 89.3 89.4 88.8 88.9 89.1  PLT 238  < > 193 196 198 214 228  < > = values in this interval not displayed. Cardiac Enzymes: No results for input(s): CKTOTAL, CKMB, CKMBINDEX, TROPONINI in the last 168 hours. BNP (last 3 results)  Recent Labs  07/11/14 1959  PROBNP 2482.0*   CBG:  Recent Labs Lab 09/07/14 1649 09/07/14 2049 09/08/14 0745 09/08/14 1146 09/08/14 1700  GLUCAP 151* 119* 107* 147* 147*    Recent Results (from the past 240 hour(s))  Culture, blood (routine x 2)     Status: None   Collection Time: 09/02/14  7:52 PM  Result Value Ref Range Status   Specimen Description BLOOD RIGHT HAND  Final   Special Requests BOTTLES DRAWN AEROBIC AND ANAEROBIC 5ML  Final   Culture   Final    STREPTOCOCCUS GROUP C Note: Gram Stain Report Called to,Read Back By and Verified With: Lethea Killings RN 55P Performed at [facility]    Report Status 09/05/2014 FINAL  Final   Organism ID, Bacteria STREPTOCOCCUS GROUP C  Final      Susceptibility   Streptococcus group c - MIC (ETEST)*    PENICILLIN 0.032 SENSITIVE Sensitive     * STREPTOCOCCUS GROUP C  Culture, blood (routine x 2)     Status: None (Preliminary result)   Collection Time: 09/02/14  9:38 PM  Result Value Ref Range Status   Specimen Description BLOOD LEFT HAND  Final   Special Requests BOTTLES DRAWN AEROBIC AND ANAEROBIC 3CC  Final   Culture   Final           BLOOD CULTURE RECEIVED NO GROWTH TO DATE CULTURE WILL BE HELD FOR 5 DAYS BEFORE ISSUING A FINAL NEGATIVE REPORT Performed at [facility]    Report Status PENDING  Incomplete  MRSA PCR Screening     Status: Abnormal   Collection  Time: 09/03/14  2:25 AM  Result Value Ref Range Status   MRSA by PCR POSITIVE (A) NEGATIVE Final    Comment:        The GeneXpert MRSA Assay (FDA approved for NASAL specimens only), is one component of a comprehensive MRSA colonization surveillance program. It is not intended to diagnose MRSA infection nor to guide or monitor treatment for MRSA infections. RESULT CALLED TO, READ BACK BY AND VERIFIED WITH: Donne Anon RN AT [phone] ON 01.08.16 BY SHUEA   Urine culture     Status: None   Collection Time: 09/03/14 11:20 AM  Result Value Ref Range Status   Specimen Description URINE, CLEAN  CATCH  Final   Special Requests NONE  Final   Colony Count   Final    1,000 COLONIES/ML Performed at [facility]    Culture   Final    INSIGNIFICANT GROWTH Performed at [facility]    Report Status 09/04/2014 FINAL  Final     Studies: No results found.  Scheduled Meds: . allopurinol  100 mg Oral Daily  . amLODipine  10 mg Oral Daily  . Chlorhexidine Gluconate Cloth  6 each Topical Q0600  . cholecalciferol  1,000 Units Oral Daily  . furosemide  40 mg Intravenous BID  . gabapentin  300 mg Oral TID  . heparin subcutaneous  5,000 Units Subcutaneous 3 times per day  . insulin aspart  0-9 Units Subcutaneous TID WC  . isosorbide mononitrate  10 mg Oral BID  . levofloxacin (LEVAQUIN) IV  750 mg Intravenous Q24H  . metoprolol tartrate  75 mg Oral BID  . multivitamins with iron  1 tablet Oral Daily  . mupirocin ointment  1 application Nasal BID  . nutrition supplement (JUVEN)  1 packet Oral BID  . pantoprazole  40 mg Oral Daily  . polyethylene glycol  17 g Oral Daily  . saccharomyces boulardii  250 mg Oral BID  . senna  1 tablet Oral BID  . sodium chloride  10-40 mL Intracatheter Q12H  . sodium chloride  3 mL Intravenous Q12H  . vitamin C  500 mg Oral Daily  . zinc sulfate  220 mg Oral Daily   Continuous Infusions:   Principal Problem:   Recurrent cellulitis of lower  leg Active Problems:   Essential hypertension   Diabetes mellitus type 2 with neurological manifestations   Acute on chronic renal failure   Morbid obesity   Pulmonary embolism   Anemia of chronic renal failure, stage 3 (moderate)   Diabetic neuropathy   Gout   DM type 2 causing CKD stage 3   Coronary atherosclerosis of native coronary artery   Sepsis   Open thigh wound   Streptococcal bacteremia   DVT (deep venous thrombosis)   Cellulitis, leg    Time spent: 35 minutes.    Solaris Kram  [facility] Pager [phone]. If 7PM-7AM, please contact night-coverage at [email or web address], password [facility] 09/08/2014, 5:45 PM  LOS: 6 days

## 2014-09-09 ENCOUNTER — Encounter (HOSPITAL_COMMUNITY): Payer: Self-pay | Admitting: Plastic Surgery

## 2014-09-09 LAB — CULTURE, BLOOD (ROUTINE X 2): Culture: NO GROWTH

## 2014-09-09 LAB — BASIC METABOLIC PANEL
Anion gap: 9 (ref 5–15)
BUN: 24 mg/dL — ABNORMAL HIGH (ref 6–23)
CO2: 29 mmol/L (ref 19–32)
Calcium: 8.2 mg/dL — ABNORMAL LOW (ref 8.4–10.5)
Chloride: 102 mEq/L (ref 96–112)
Creatinine, Ser: 1.21 mg/dL — ABNORMAL HIGH (ref 0.50–1.10)
GFR calc Af Amer: 52 mL/min — ABNORMAL LOW (ref >90)
GFR, EST NON AFRICAN AMERICAN: 45 mL/min — AB (ref >90)
Glucose, Bld: 138 mg/dL — ABNORMAL HIGH (ref 70–99)
Potassium: 3.7 mmol/L (ref 3.5–5.1)
Sodium: 140 mmol/L (ref 135–145)

## 2014-09-09 LAB — CBC
HEMATOCRIT: 27.4 % — AB (ref 36.0–46.0)
Hemoglobin: 8.6 g/dL — ABNORMAL LOW (ref 12.0–15.0)
MCH: 27.9 pg (ref 26.0–34.0)
MCHC: 31.4 g/dL (ref 30.0–36.0)
MCV: 89 fL (ref 78.0–100.0)
PLATELETS: 222 10*3/uL (ref 150–400)
RBC: 3.08 MIL/uL — AB (ref 3.87–5.11)
RDW: 15.7 % — AB (ref 11.5–15.5)
WBC: 6.5 10*3/uL (ref 4.0–10.5)

## 2014-09-09 LAB — GLUCOSE, CAPILLARY
GLUCOSE-CAPILLARY: 95 mg/dL (ref 70–99)
GLUCOSE-CAPILLARY: 134 mg/dL — AB (ref 70–99)
GLUCOSE-CAPILLARY: 140 mg/dL — AB (ref 70–99)

## 2014-09-09 MED ORDER — SACCHAROMYCES BOULARDII 250 MG PO CAPS: 250.0000 mg | ORAL_CAPSULE | Freq: Two times a day (BID) | ORAL | Status: AC

## 2014-09-09 MED ORDER — LEVOFLOXACIN 750 MG PO TABS: 750.0000 mg | ORAL_TABLET | Freq: Every day | ORAL | Status: DC

## 2014-09-09 NOTE — Progress Notes (Signed)
Occupational Therapy Treatment Patient Details Name: Kelly [name] MRN: [id number] DOB: 05-Apr-1946 Today's Date: 09/09/2014    History of present illness 65-yo female admitted  09/02/13  bil LE pain/cellulitis.   PMHx:  chronic diastolic heart failure, hypertension, hyperlipidemia, diabetes mellitus type 2, obstructive sleep apnea, GERD, depression, chronic lymphedema, and indwelling Foley catheter. Recently had I/D  L thigh wound  in 6/15 with multiple complications  including PE. Patient 's leg wound being managed by Wound center.   OT comments  Pt supposed to d/c today as discharge was delayed from 09/07/14 due to loose stools. Pt doing well and feels ready for d/c home. Reviewed safety recommendations with pt and pt is moving well.   Follow Up Recommendations  Home health OT;Supervision/Assistance - 24 hour    Equipment Recommendations  None recommended by OT    Recommendations for Other Services      Precautions / Restrictions Precautions Precautions: Fall       Mobility Bed Mobility Overal bed mobility: Needs Assistance Bed Mobility: Supine to Sit     Supine to sit: Supervision        Transfers Overall transfer level: Needs assistance Equipment used: Rolling walker (2 wheeled) Transfers: Sit to/from Stand Sit to Stand: Supervision         General transfer comment: uses momentum. min cues to reach back to sit on bed.     Balance                                   ADL       Grooming: Wash/dry hands;Standing;Supervision/safety                   Toilet Transfer: Supervision/safety;Min guard;Ambulation;Comfort height toilet;Grab bars   Toileting- Clothing Manipulation and Hygiene: Supervision/safety;Sit to/from stand         General ADL Comments: Pt was supposed to d/c 09/07/14 but then started to have loose stools and stayed. Saw pt to ensure pt felt comfortable with d/c home and pt moving well and remembering safety strategies  emphasized by this OT last session on Tuesday. Discussed importance of having supervision for showering sitting down to conserve energy. Pt verbalized understanding. pt donned dress overhead today for going home and slipped on shoes. She has a Secondary school teacher to don underwear so nursing assisted with starting underwear over feet today.       Vision                     Perception     Praxis      Cognition   Behavior During Therapy: WFL for tasks assessed/performed Overall Cognitive Status: Within Functional Limits for tasks assessed                       Extremity/Trunk Assessment               Exercises     Shoulder Instructions       General Comments      Pertinent Vitals/ Pain       Pain Assessment: No/denies pain  Home Living                                          Prior Functioning/Environment  Frequency Min 2X/week     Progress Toward Goals  OT Goals(current goals can now be found in the care plan section)  Progress towards OT goals: Progressing toward goals     Plan Discharge plan remains appropriate    Co-evaluation                 End of Session Equipment Utilized During Treatment: Rolling walker   Activity Tolerance Patient tolerated treatment well   Patient Left with call bell/phone within reach (EOB with nursing)   Nurse Communication          Time: 2703-5009 OT Time Calculation (min): 32 min  Charges: OT General Charges $OT Visit: 1 Procedure OT Treatments $Self Care/Home Management : 8-22 mins $Therapeutic Activity: 8-22 mins  Jules Schick  [phone] 09/09/2014, 1:16 PM

## 2014-09-09 NOTE — Discharge Summary (Signed)
Kelly [name], is a 69 y.o. female  DOB 07-19-1946  MRN [id number].  Admission date:  09/02/2014  Admitting Physician  Ivor Costa, MD  Discharge Date:  09/09/2014   Primary MD  Alvester Chou, NP  Recommendations for primary care physician for things to follow:   Follow renal function   Admission Diagnosis  AKI (acute kidney injury) [N17.9]   Discharge Diagnosis  AKI (acute kidney injury) [N17.9]    Active Problems:   Essential hypertension   Diabetes mellitus type 2 with neurological manifestations   Morbid obesity   Pulmonary embolism   Anemia of chronic renal failure, stage 3 (moderate)   Diabetic neuropathy   Gout   DM type 2 causing CKD stage 3   Coronary atherosclerosis of native coronary artery   Open thigh wound   DVT (deep venous thrombosis)      Past Medical History  Diagnosis Date  . Hypertension   . Hyperlipidemia   . CHF (congestive heart failure)   . GERD (gastroesophageal reflux disease)   . Depression   . Lymphedema of leg     left leg  . Pulmonary embolism 02/18/14    diagnosed at [facility] with CT angio chest  . Chronic indwelling Foley catheter   . Heart murmur     "just found out I have one" (04/07/2014)  . DVT (deep venous thrombosis) 01/2014    LLE; "went from my leg to my lungs"  . Pneumonia 1990's X 2  . Sleep apnea     doesn't use cpap machine or 02 at night (04/07/2014)  . Anemia   . History of blood transfusion 1990's X 2    "when they did my surgeries"  . Migraines     "none for years now" (04/07/2014)  . Arthritis     "arms, legs" (04/07/2014)  . Gout   . Urinary incontinence   . Chronic kidney disease (CKD), stage III (moderate)     [name] 04/07/2014  . Neuropathy   . Diabetes mellitus     "at one time" (04/07/2014)    Past Surgical History  Procedure Laterality Date  . Shoulder  open rotator cuff repair Right ~ 2000  . Knee arthroscopy Right ?1980's  . Temporal artery biopsy / ligation Right ?1990's  . Colonoscopy with propofol  09/23/2012    Procedure: COLONOSCOPY WITH PROPOFOL;  Surgeon: Jerene Bears, MD;  Location: [facility];  Service: Gastroenterology;  Laterality: N/A;  . Panniculectomy Left 02/08/2014    w/wound debridement @ medial thigh  . Irrigation and debridement abscess Left 02/24/2014    Procedure: MINOR INCISION AND DRAINAGE OF ABSCESS;  Surgeon: Theodoro Kos, DO;  Location: [facility];  Service: Plastics;  Laterality: Left;  . I&d extremity Left 03/03/2014    Procedure: IRRIGATION AND DEBRIDEMENT LEFT LEG WOUND AND PLACEMENT OF A CELL AND VAC;  Surgeon: Theodoro Kos, DO;  Location: [place];  Service: Plastics;  Laterality: Left;  . Incision and drainage of wound Left 03/16/2014    Procedure: IRRIGATION  AND DEBRIDEMENT OF LEFT THIGH WOUND, APPLICATION ACELL;  Surgeon: Irene Limbo, MD;  Location: [place];  Service: Plastics;  Laterality: Left;  . Incision and drainage of wound Left 03/25/2014    Procedure: IRRIGATION AND DEBRIDEMENT OF LEFT LEG WOUND WITH PLACEMENT OF ACELL/VAC;  Surgeon: Theodoro Kos, DO;  Location: [place];  Service: Plastics;  Laterality: Left;  . Incision and drainage of wound Left 03/31/2014    Procedure: IRRIGATION AND DEBRIDEMENT LEFT LEG WOUND WITH PLACEMENT OF A-CELL/VAC;  Surgeon: Theodoro Kos, DO;  Location: [place];  Service: Plastics;  Laterality: Left;  . Cataract extraction w/ intraocular lens implant Right 2014  . Vena cava filter placement  02/2014  . Incision and drainage of wound Left 04/07/2014    Procedure: IRRIGATION AND DEBRIDEMENT LEFT LEG WOUND WITH PLACEMENT OF A CELL AND VAC;  Surgeon: Theodoro Kos, DO;  Location: [place];  Service: Plastics;  Laterality: Left;  . Incision and drainage of wound Left 04/14/2014    Procedure: IRRIGATION AND DEBRIDEMENT WOUND;  Surgeon: Theodoro Kos, DO;  Location: [place];  Service: Plastics;   Laterality: Left;  . Application of a-cell of extremity Left 04/14/2014    Procedure: APPLICATION OF A-CELL OF EXTREMITY;  Surgeon: Theodoro Kos, DO;  Location: [place];  Service: Plastics;  Laterality: Left;  Marland Kitchen Minor application of wound vac Left 04/14/2014    Procedure: MINOR APPLICATION OF WOUND VAC;  Surgeon: Theodoro Kos, DO;  Location: [place];  Service: Plastics;  Laterality: Left;  . Incision and drainage of wound Left 04/21/2014    Procedure: IRRIGATION AND DEBRIDEMENT OF LEFT UPPER LEG WOUND WITH PLACEMENT OF ACELL/VAC;  Surgeon: Theodoro Kos, DO;  Location: [place];  Service: Plastics;  Laterality: Left;       History of present illness and  Hospital Course:     Kindly see H&P for history of present illness and admission details, please review complete Labs, Consult reports and Test reports for all details in brief  HPI  from the history and physical done on the day of admission    Kelly [name] is a 69 y.o. female with past medical history of Hypertension; Hyperlipidemia; diastolic CHF (congestive heart failure); GERD (gastroesophageal reflux disease); Depression; Lymphedema of leg; Pulmonary embolism (02/18/14); Chronic indwelling Foley catheter; DVT (deep venous thrombosis) (01/2014); Pneumonia (1990's X 2); Sleep apnea; Anemia; History of blood transfusion (1990's X 2); Migraines; Arthritis; Gout; Urinary incontinence; Chronic kidney disease (CKD), stage III (moderate); Neuropathy; and Diabetes mellitus, hx of group C Streptococcus bacteremia and severe sepsis due to left leg cellulitis Into left thigh in September, who presented with leg pain and was found to have Acute streptococcal cellulitis with bacteremia. She was to go home on 09/07/2013 but developed diarrhea, holding d/c(please refer to Dr. Almeta Monas comprehensive discharge summary on 09/07/2013 for details). The diarrhea has since stopped therefore more stool samples were obtained for studies. Patient was  placed on Levaquin after the diarrhea and she should continue the Levaquin for 3 more days instead of Keflex which had been recommended by ID as question of penicillin allergy. She tells me she never filled the prescription for Eliquis as her insurance did not approve it last year. She has been working with her PCP regarding this and I therefore deferred this decision to her PCP. She is discharged in stable condition with home health services.  Discharge Condition: Stable.   Follow UP   PCP   Discharge Instructions  and  Discharge  Medications    Discharge Instructions    Diet - low sodium heart healthy    Complete by:  As directed      Diet - low sodium heart healthy    Complete by:  As directed      Increase activity slowly    Complete by:  As directed      Increase activity slowly    Complete by:  As directed             Medication List    STOP taking these medications        apixaban 5 MG Tabs tablet  Commonly known as:  ELIQUIS     cephALEXin 500 MG capsule  Commonly known as:  KEFLEX      TAKE these medications        acetaminophen 325 MG tablet  Commonly known as:  TYLENOL  Take 650 mg by mouth every 6 (six) hours as needed for fever.     allopurinol 100 MG tablet  Commonly known as:  ZYLOPRIM  Take 100 mg by mouth daily.     cholecalciferol 1000 UNITS tablet  Commonly known as:  VITAMIN D  Take 1,000 Units by mouth daily.     esomeprazole 40 MG capsule  Commonly known as:  NEXIUM  Take 40 mg by mouth daily before breakfast. Take on an empty stomach     furosemide 40 MG tablet  Commonly known as:  LASIX  Take 40 mg by mouth 2 (two) times daily.     gabapentin 300 MG capsule  Commonly known as:  NEURONTIN  Take 300 mg by mouth 3 (three) times daily.     isosorbide mononitrate 10 MG tablet  Commonly known as:  ISMO,MONOKET  Take 10 mg by mouth 2 (two) times daily.     JUVEN NUTRIVIGOR Pack  Take 1 Container by mouth 2 (two) times daily.      levofloxacin 750 MG tablet  Commonly known as:  LEVAQUIN  Take 1 tablet (750 mg total) by mouth daily.     metoprolol tartrate 25 MG tablet  Commonly known as:  LOPRESSOR  Take 3 tablets (75 mg total) by mouth 2 (two) times daily.     Oxycodone HCl 10 MG Tabs  Take 1 tablet (10 mg total) by mouth every 4 (four) hours as needed (moderate pain).     polyethylene glycol packet  Commonly known as:  MIRALAX / GLYCOLAX  Take 17 g by mouth daily. Mix with 4-6 oz liquid     promethazine 25 MG tablet  Commonly known as:  PHENERGAN  Take 25 mg by mouth every 6 (six) hours as needed for nausea or vomiting.     saccharomyces boulardii 250 MG capsule  Commonly known as:  FLORASTOR  Take 1 capsule (250 mg total) by mouth 2 (two) times daily.     senna 8.6 MG Tabs tablet  Commonly known as:  SENOKOT  Take 1 tablet (8.6 mg total) by mouth 2 (two) times daily.     zolpidem 5 MG tablet  Commonly known as:  AMBIEN  Take 1 tablet (5 mg total) by mouth at bedtime as needed for sleep.          Diet and Activity recommendation: See Discharge Instructions above   Consults obtained -    Major procedures and Radiology Reports - PLEASE review detailed and final reports for all details, in brief -      US Renal  09/02/2014  CLINICAL DATA:  Acute kidney injury.  EXAM: RENAL/URINARY TRACT ULTRASOUND COMPLETE  COMPARISON:  None.  FINDINGS: Technically limited exam due to body habitus.  Right Kidney:  Length: 12.8 Cm. Echogenicity within normal limits. No mass or hydronephrosis visualized.  Left Kidney:  Length: 12.2 cm. Echogenicity within normal limits. No mass or hydronephrosis visualized.  Bladder:  Appears normal for degree of bladder distention. Both ureteral jets are seen.  IMPRESSION: No hydronephrosis or obstructive uropathy.   Electronically Signed   By: Jeb Levering M.D.   On: 09/02/2014 23:58    Micro Results     Recent Results (from the past 240 hour(s))  Culture, blood  (routine x 2)     Status: None   Collection Time: 09/02/14  7:52 PM  Result Value Ref Range Status   Specimen Description BLOOD RIGHT HAND  Final   Special Requests BOTTLES DRAWN AEROBIC AND ANAEROBIC 5ML  Final   Culture   Final    STREPTOCOCCUS GROUP C Note: Gram Stain Report Called to,Read Back By and Verified With: Lethea Killings RN 13P Performed at [facility]    Report Status 09/05/2014 FINAL  Final   Organism ID, Bacteria STREPTOCOCCUS GROUP C  Final      Susceptibility   Streptococcus group c - MIC (ETEST)*    PENICILLIN 0.032 SENSITIVE Sensitive     * STREPTOCOCCUS GROUP C  Culture, blood (routine x 2)     Status: None   Collection Time: 09/02/14  9:38 PM  Result Value Ref Range Status   Specimen Description BLOOD LEFT HAND  Final   Special Requests BOTTLES DRAWN AEROBIC AND ANAEROBIC 3CC  Final   Culture   Final    NO GROWTH 5 DAYS Performed at [facility]    Report Status 09/09/2014 FINAL  Final  MRSA PCR Screening     Status: Abnormal   Collection Time: 09/03/14  2:25 AM  Result Value Ref Range Status   MRSA by PCR POSITIVE (A) NEGATIVE Final    Comment:        The GeneXpert MRSA Assay (FDA approved for NASAL specimens only), is one component of a comprehensive MRSA colonization surveillance program. It is not intended to diagnose MRSA infection nor to guide or monitor treatment for MRSA infections. RESULT CALLED TO, READ BACK BY AND VERIFIED WITH: L. REINERT RN AT [phone] ON 01.08.16 BY SHUEA   Urine culture     Status: None   Collection Time: 09/03/14 11:20 AM  Result Value Ref Range Status   Specimen Description URINE, CLEAN CATCH  Final   Special Requests NONE  Final   Colony Count   Final    1,000 COLONIES/ML Performed at [facility]    Culture   Final    INSIGNIFICANT GROWTH Performed at [facility]    Report Status 09/04/2014 FINAL  Final       Today   Subjective:   Kelly [name] today has no  headache,no chest abdominal pain,no new weakness tingling or numbness, feels much better wants to go home today.   Objective:   Blood pressure 156/63, pulse 79, temperature 98.4 F (36.9 C), temperature source Oral, resp. rate 18, height 5\' 6"  (1.676 m), weight 146.9 kg (323 lb 13.7 oz), SpO2 99 %.   Intake/Output Summary (Last 24 hours) at 09/09/14 1214 Last data filed at 09/09/14 1046  Gross per 24 hour  Intake    513 ml  Output   2300 ml  Net  -1787 ml    Exam Awake Alert, Oriented x 3, No new F.N deficits, Normal affect [place].AT,PERRAL Supple Neck,No JVD, No cervical lymphadenopathy appriciated.  Symmetrical Chest wall movement, Good air movement bilaterally, CTAB RRR,No Gallops,Rubs or new Murmurs, No Parasternal Heave +ve B.Sounds, Abd Soft, Non tender, No organomegaly appriciated, No rebound -guarding or rigidity. No Cyanosis, Clubbing or edema, No new Rash or bruise  Data Review   CBC w Diff: Lab Results  Component Value Date   WBC 6.5 09/09/2014   HGB 8.6* 09/09/2014   HCT 27.4* 09/09/2014   PLT 222 09/09/2014   LYMPHOPCT 4* 09/02/2014   MONOPCT 5 09/02/2014   EOSPCT 0 09/02/2014   BASOPCT 0 09/02/2014    CMP: Lab Results  Component Value Date   NA 140 09/09/2014   K 3.7 09/09/2014   CL 102 09/09/2014   CO2 29 09/09/2014   BUN 24* 09/09/2014   CREATININE 1.21* 09/09/2014   PROT 7.5 09/03/2014   ALBUMIN 2.6* 09/03/2014   BILITOT 0.6 09/03/2014   ALKPHOS 103 09/03/2014   AST 22 09/03/2014   ALT 14 09/03/2014  .   Total Time in preparing paper work, data evaluation and todays exam - 35 minutes  Eknoor Novack M.D on 09/09/2014 at 12:14 PM  [facility] Office  [phone]

## 2014-10-11 NOTE — Discharge Instructions (Signed)

## 2014-10-12 ENCOUNTER — Inpatient Hospital Stay (HOSPITAL_COMMUNITY)
Admission: RE | Admit: 2014-10-12 | Discharge: 2014-10-12 | Disposition: A | Discharge status: Home / Self Care | Payer: Medicare Other | Source: Ambulatory Visit | Attending: Nephrology | Admitting: Nephrology

## 2014-10-14 ENCOUNTER — Emergency Department (HOSPITAL_COMMUNITY)
Admission: EM | Admit: 2014-10-14 | Discharge: 2014-10-14 | Disposition: A | Discharge status: Home / Self Care | Payer: Medicare Other | Attending: Emergency Medicine | Admitting: Emergency Medicine

## 2014-10-14 ENCOUNTER — Encounter (HOSPITAL_COMMUNITY): Payer: Self-pay | Admitting: General Practice

## 2014-10-14 ENCOUNTER — Inpatient Hospital Stay (HOSPITAL_COMMUNITY): Admission: RE | Admit: 2014-10-14 | Payer: Medicare Other | Source: Ambulatory Visit

## 2014-10-14 DIAGNOSIS — M159 Polyosteoarthritis, unspecified: Secondary | ICD-10-CM | POA: Diagnosis not present

## 2014-10-14 DIAGNOSIS — G4730 Sleep apnea, unspecified: Secondary | ICD-10-CM | POA: Insufficient documentation

## 2014-10-14 DIAGNOSIS — G629 Polyneuropathy, unspecified: Secondary | ICD-10-CM | POA: Diagnosis not present

## 2014-10-14 DIAGNOSIS — Z862 Personal history of diseases of the blood and blood-forming organs and certain disorders involving the immune mechanism: Secondary | ICD-10-CM | POA: Insufficient documentation

## 2014-10-14 DIAGNOSIS — R609 Edema, unspecified: Secondary | ICD-10-CM | POA: Diagnosis not present

## 2014-10-14 DIAGNOSIS — Z792 Long term (current) use of antibiotics: Secondary | ICD-10-CM | POA: Diagnosis not present

## 2014-10-14 DIAGNOSIS — Z79899 Other long term (current) drug therapy: Secondary | ICD-10-CM | POA: Diagnosis not present

## 2014-10-14 DIAGNOSIS — Z8701 Personal history of pneumonia (recurrent): Secondary | ICD-10-CM | POA: Insufficient documentation

## 2014-10-14 DIAGNOSIS — I129 Hypertensive chronic kidney disease with stage 1 through stage 4 chronic kidney disease, or unspecified chronic kidney disease: Secondary | ICD-10-CM | POA: Insufficient documentation

## 2014-10-14 DIAGNOSIS — Z86718 Personal history of other venous thrombosis and embolism: Secondary | ICD-10-CM | POA: Diagnosis not present

## 2014-10-14 DIAGNOSIS — K219 Gastro-esophageal reflux disease without esophagitis: Secondary | ICD-10-CM | POA: Diagnosis not present

## 2014-10-14 DIAGNOSIS — Z87891 Personal history of nicotine dependence: Secondary | ICD-10-CM | POA: Insufficient documentation

## 2014-10-14 DIAGNOSIS — I509 Heart failure, unspecified: Secondary | ICD-10-CM | POA: Diagnosis not present

## 2014-10-14 DIAGNOSIS — E785 Hyperlipidemia, unspecified: Secondary | ICD-10-CM | POA: Diagnosis not present

## 2014-10-14 DIAGNOSIS — R011 Cardiac murmur, unspecified: Secondary | ICD-10-CM | POA: Insufficient documentation

## 2014-10-14 DIAGNOSIS — F329 Major depressive disorder, single episode, unspecified: Secondary | ICD-10-CM | POA: Diagnosis not present

## 2014-10-14 DIAGNOSIS — N183 Chronic kidney disease, stage 3 (moderate): Secondary | ICD-10-CM | POA: Diagnosis not present

## 2014-10-14 DIAGNOSIS — M109 Gout, unspecified: Secondary | ICD-10-CM | POA: Insufficient documentation

## 2014-10-14 DIAGNOSIS — R2240 Localized swelling, mass and lump, unspecified lower limb: Secondary | ICD-10-CM | POA: Diagnosis present

## 2014-10-14 DIAGNOSIS — E119 Type 2 diabetes mellitus without complications: Secondary | ICD-10-CM | POA: Insufficient documentation

## 2014-10-14 DIAGNOSIS — G43909 Migraine, unspecified, not intractable, without status migrainosus: Secondary | ICD-10-CM | POA: Insufficient documentation

## 2014-10-14 DIAGNOSIS — Z880 Allergy status to penicillin: Secondary | ICD-10-CM | POA: Diagnosis not present

## 2014-10-14 DIAGNOSIS — Z86711 Personal history of pulmonary embolism: Secondary | ICD-10-CM | POA: Insufficient documentation

## 2014-10-14 LAB — CBC WITH DIFFERENTIAL/PLATELET
Basophils Absolute: 0 10*3/uL (ref 0.0–0.1)
Basophils Relative: 0 % (ref 0–1)
EOS PCT: 6 % — AB (ref 0–5)
Eosinophils Absolute: 0.4 10*3/uL (ref 0.0–0.7)
HEMATOCRIT: 28.1 % — AB (ref 36.0–46.0)
HEMOGLOBIN: 8.8 g/dL — AB (ref 12.0–15.0)
LYMPHS ABS: 1.1 10*3/uL (ref 0.7–4.0)
LYMPHS PCT: 17 % (ref 12–46)
MCH: 27.4 pg (ref 26.0–34.0)
MCHC: 31.3 g/dL (ref 30.0–36.0)
MCV: 87.5 fL (ref 78.0–100.0)
MONOS PCT: 7 % (ref 3–12)
Monocytes Absolute: 0.5 10*3/uL (ref 0.1–1.0)
Neutro Abs: 4.7 10*3/uL (ref 1.7–7.7)
Neutrophils Relative %: 70 % (ref 43–77)
PLATELETS: 260 10*3/uL (ref 150–400)
RBC: 3.21 MIL/uL — AB (ref 3.87–5.11)
RDW: 15.3 % (ref 11.5–15.5)
WBC: 6.7 10*3/uL (ref 4.0–10.5)

## 2014-10-14 LAB — COMPREHENSIVE METABOLIC PANEL
ALBUMIN: 2.3 g/dL — AB (ref 3.5–5.2)
ALK PHOS: 91 U/L (ref 39–117)
ALT: 10 U/L (ref 0–35)
ANION GAP: 7 (ref 5–15)
AST: 16 U/L (ref 0–37)
BUN: 33 mg/dL — ABNORMAL HIGH (ref 6–23)
CALCIUM: 8.3 mg/dL — AB (ref 8.4–10.5)
CO2: 24 mmol/L (ref 19–32)
CREATININE: 1.91 mg/dL — AB (ref 0.50–1.10)
Chloride: 108 mmol/L (ref 96–112)
GFR calc Af Amer: 30 mL/min — ABNORMAL LOW (ref >90)
GFR calc non Af Amer: 26 mL/min — ABNORMAL LOW (ref >90)
GLUCOSE: 177 mg/dL — AB (ref 70–99)
POTASSIUM: 4.2 mmol/L (ref 3.5–5.1)
SODIUM: 139 mmol/L (ref 135–145)
TOTAL PROTEIN: 7.6 g/dL (ref 6.0–8.3)
Total Bilirubin: 0.6 mg/dL (ref 0.3–1.2)

## 2014-10-14 MED ORDER — ACETAMINOPHEN 325 MG PO TABS
650.0000 mg | ORAL_TABLET | Freq: Once | ORAL | Status: AC
  Administered 2014-10-14: 650 mg via ORAL
  Filled 2014-10-14: qty 2

## 2014-10-14 NOTE — ED Notes (Addendum)
Non adherent dressing applied to back of pt legs where skin tear noted. Pt helped into wheelchair and waiting for ride to come get her. NAD noted.

## 2014-10-14 NOTE — ED Notes (Signed)
Dr. Wentz at bedside. 

## 2014-10-14 NOTE — ED Notes (Signed)
Pt brought in via EMS. Complaining of bilateral lower extremity pain, dizziness, and nausea. Pt has history of edema, and CHF. Pt complaining of 7/10 pain. EMS vital signs 164/88, 68 HR, 20 RR, 100% R/A, CBG 218.

## 2014-10-14 NOTE — Discharge Instructions (Signed)
Have your doctorn recheck your blood counts when you see her to evaluate your renal function, and hemoglobin. Reschedule your iron infusion, for as soon as possible.    Peripheral Edema You have swelling in your legs (peripheral edema). This swelling is due to excess accumulation of salt and water in your body. Edema may be a sign of heart, kidney or liver disease, or a side effect of a medication. It may also be due to problems in the leg veins. Elevating your legs and using special support stockings may be very helpful, if the cause of the swelling is due to poor venous circulation. Avoid long periods of standing, whatever the cause. Treatment of edema depends on identifying the cause. Chips, pretzels, pickles and other salty foods should be avoided. Restricting salt in your diet is almost always needed. Water pills (diuretics) are often used to remove the excess salt and water from your body via urine. These medicines prevent the kidney from reabsorbing sodium. This increases urine flow. Diuretic treatment may also result in lowering of potassium levels in your body. Potassium supplements may be needed if you have to use diuretics daily. Daily weights can help you keep track of your progress in clearing your edema. You should call your caregiver for follow up care as recommended. SEEK IMMEDIATE MEDICAL CARE IF:   You have increased swelling, pain, redness, or heat in your legs.  You develop shortness of breath, especially when lying down.  You develop chest or abdominal pain, weakness, or fainting.  You have a fever. Document Released: 09/20/2004 Document Revised: 11/05/2011 Document Reviewed: 08/31/2009 [facility] Patient Information 2015 [place], [place]. This information is not intended to replace advice given to you by your health care provider. Make sure you discuss any questions you have with your health care provider.

## 2014-10-14 NOTE — ED Provider Notes (Signed)
CSN: [id number]     Arrival date & time 10/14/14  1447 History   First MD Initiated Contact with Patient 10/14/14 1507     Chief Complaint  Patient presents with  . Leg Swelling     (Consider location/radiation/quality/duration/timing/severity/associated sxs/prior Treatment) HPI   Kelly [name] is a 69 y.o. female who is here for evaluation of lower extremity pain associated with edema.  She presents here  by EMS, at 1447.  She is scheduled to have an iron infusion, at 3:45 PM today.  She lives alone.  She saw her PCP after hospital discharge, about 2 weeks ago.  She had been hospitalized one month ago with cellulitis leading to bacteremia.  She completed her course of antibiotics.  She denies fever, chills, nausea, vomiting, weakness or dizziness.  She is able to ambulate, at home, and get into the shower each day where she washes herself well.  She complains of persistent leg swelling associated with occasional drainage from various sites on her bilateral legs.  She is taking her medications as prescribed.  There are no other known modifying factors.  Past Medical History  Diagnosis Date  . Hypertension   . Hyperlipidemia   . CHF (congestive heart failure)   . GERD (gastroesophageal reflux disease)   . Depression   . Lymphedema of leg     left leg  . Pulmonary embolism 02/18/14    diagnosed at [facility] with CT angio chest  . Chronic indwelling Foley catheter   . Heart murmur     "just found out I have one" (04/07/2014)  . DVT (deep venous thrombosis) 01/2014    LLE; "went from my leg to my lungs"  . Pneumonia 1990's X 2  . Sleep apnea     doesn't use cpap machine or 02 at night (04/07/2014)  . Anemia   . History of blood transfusion 1990's X 2    "when they did my surgeries"  . Migraines     "none for years now" (04/07/2014)  . Arthritis     "arms, legs" (04/07/2014)  . Gout   . Urinary incontinence   . Chronic kidney disease (CKD), stage III (moderate)     [name] 04/07/2014  .  Neuropathy   . Diabetes mellitus     "at one time" (04/07/2014)   Past Surgical History  Procedure Laterality Date  . Shoulder open rotator cuff repair Right ~ 2000  . Knee arthroscopy Right ?1980's  . Temporal artery biopsy / ligation Right ?1990's  . Colonoscopy with propofol  09/23/2012    Procedure: COLONOSCOPY WITH PROPOFOL;  Surgeon: Jerene Bears, MD;  Location: [facility];  Service: Gastroenterology;  Laterality: N/A;  . Panniculectomy Left 02/08/2014    w/wound debridement @ medial thigh  . Irrigation and debridement abscess Left 02/24/2014    Procedure: MINOR INCISION AND DRAINAGE OF ABSCESS;  Surgeon: Theodoro Kos, DO;  Location: [facility];  Service: Plastics;  Laterality: Left;  . I&d extremity Left 03/03/2014    Procedure: IRRIGATION AND DEBRIDEMENT LEFT LEG WOUND AND PLACEMENT OF A CELL AND VAC;  Surgeon: Theodoro Kos, DO;  Location: [place];  Service: Plastics;  Laterality: Left;  . Incision and drainage of wound Left 03/16/2014    Procedure: IRRIGATION AND DEBRIDEMENT OF LEFT THIGH WOUND, APPLICATION ACELL;  Surgeon: Irene Limbo, MD;  Location: [place];  Service: Plastics;  Laterality: Left;  . Incision and drainage of wound Left 03/25/2014    Procedure: IRRIGATION AND DEBRIDEMENT OF LEFT  LEG WOUND WITH PLACEMENT OF ACELL/VAC;  Surgeon: Theodoro Kos, DO;  Location: [place];  Service: Plastics;  Laterality: Left;  . Incision and drainage of wound Left 03/31/2014    Procedure: IRRIGATION AND DEBRIDEMENT LEFT LEG WOUND WITH PLACEMENT OF A-CELL/VAC;  Surgeon: Theodoro Kos, DO;  Location: [place];  Service: Plastics;  Laterality: Left;  . Cataract extraction w/ intraocular lens implant Right 2014  . Vena cava filter placement  02/2014  . Incision and drainage of wound Left 04/07/2014    Procedure: IRRIGATION AND DEBRIDEMENT LEFT LEG WOUND WITH PLACEMENT OF A CELL AND VAC;  Surgeon: Theodoro Kos, DO;  Location: [place];  Service: Plastics;  Laterality: Left;  . Incision and drainage of wound  Left 04/14/2014    Procedure: IRRIGATION AND DEBRIDEMENT WOUND;  Surgeon: Theodoro Kos, DO;  Location: [place];  Service: Plastics;  Laterality: Left;  . Application of a-cell of extremity Left 04/14/2014    Procedure: APPLICATION OF A-CELL OF EXTREMITY;  Surgeon: Theodoro Kos, DO;  Location: [place];  Service: Plastics;  Laterality: Left;  Marland Kitchen Minor application of wound vac Left 04/14/2014    Procedure: MINOR APPLICATION OF WOUND VAC;  Surgeon: Theodoro Kos, DO;  Location: [place];  Service: Plastics;  Laterality: Left;  . Incision and drainage of wound Left 04/21/2014    Procedure: IRRIGATION AND DEBRIDEMENT OF LEFT UPPER LEG WOUND WITH PLACEMENT OF ACELL/VAC;  Surgeon: Theodoro Kos, DO;  Location: [place];  Service: Plastics;  Laterality: Left;   Family History  Problem Relation Age of Onset  . Emphysema Father   . Asthma Brother   . Heart disease Mother   . Stomach cancer Brother   . Heart disease Maternal Grandmother   . Diabetes Mother   . Diabetes Sister    History  Substance Use Topics  . Smoking status: Former Smoker -- 1.00 packs/day for 33 years    Types: Cigarettes    Quit date: 08/27/1981  . Smokeless tobacco: Never Used  . Alcohol Use: Yes     Comment: 04/07/2014 "used to drink; stopped in ~ 1983"   OB History    No data available     Review of Systems  All other systems reviewed and are negative.     Allergies  Bactrim and Penicillins  Home Medications   Prior to Admission medications   Medication Sig Start Date End Date Taking? Authorizing Provider  acetaminophen (TYLENOL) 325 MG tablet Take 650 mg by mouth every 6 (six) hours as needed for fever.   Yes Historical Provider, MD  cholecalciferol (VITAMIN D) 1000 UNITS tablet Take 1,000 Units by mouth daily.   Yes Historical Provider, MD  esomeprazole (NEXIUM) 40 MG capsule Take 40 mg by mouth daily before breakfast. Take on an empty stomach   Yes Historical Provider, MD  furosemide (LASIX) 40 MG tablet Take 40 mg by  mouth 2 (two) times daily.    Yes Historical Provider, MD  gabapentin (NEURONTIN) 300 MG capsule Take 300 mg by mouth 3 (three) times daily.   Yes Historical Provider, MD  isosorbide mononitrate (ISMO,MONOKET) 10 MG tablet Take 10 mg by mouth 2 (two) times daily.   Yes Historical Provider, MD  metoprolol tartrate (LOPRESSOR) 25 MG tablet Take 3 tablets (75 mg total) by mouth 2 (two) times daily. 05/14/14  Yes Orson Eva, MD  Nutritional Supplements (JUVEN NUTRIVIGOR) PACK Take 1 Container by mouth every other day.    Yes Historical Provider, MD  Oxycodone HCl 10 MG  TABS Take 1 tablet (10 mg total) by mouth every 4 (four) hours as needed (moderate pain). 07/18/14  Yes Theodis Blaze, MD  polyethylene glycol (MIRALAX / Floria Raveling) packet Take 17 g by mouth daily. Mix with 4-6 oz liquid   Yes Historical Provider, MD  promethazine (PHENERGAN) 25 MG tablet Take 25 mg by mouth every 6 (six) hours as needed for nausea or vomiting.   Yes Historical Provider, MD  saccharomyces boulardii (FLORASTOR) 250 MG capsule Take 1 capsule (250 mg total) by mouth 2 (two) times daily. Patient taking differently: Take 250 mg by mouth every other day.  09/09/14  Yes Simbiso Ranga, MD  senna (SENOKOT) 8.6 MG TABS tablet Take 1 tablet (8.6 mg total) by mouth 2 (two) times daily. 04/09/14  Yes Marianne L [name], PA-C  zolpidem (AMBIEN) 5 MG tablet Take 1 tablet (5 mg total) by mouth at bedtime as needed for sleep. 07/18/14  Yes Theodis Blaze, MD  allopurinol (ZYLOPRIM) 100 MG tablet Take 100 mg by mouth daily.     Historical Provider, MD  levofloxacin (LEVAQUIN) 750 MG tablet Take 1 tablet (750 mg total) by mouth daily. 09/09/14   Simbiso Ranga, MD   BP 137/65 mmHg  Pulse 61  Temp(Src) 97.9 F (36.6 C) (Oral)  Resp 11  Ht 5\' 6"  (1.676 m)  Wt 358 lb 4.8 oz (162.524 kg)  BMI 57.86 kg/m2  SpO2 100% Physical Exam  Constitutional: She is oriented to person, place, and time. She appears well-developed.  Morbidly obese  HENT:   Head: Normocephalic and atraumatic.  Right Ear: External ear normal.  Left Ear: External ear normal.  Eyes: Conjunctivae and EOM are normal. Pupils are equal, round, and reactive to light.  Neck: Normal range of motion and phonation normal. Neck supple.  Cardiovascular: Normal rate, regular rhythm and normal heart sounds.   Pulmonary/Chest: Effort normal and breath sounds normal. She exhibits no bony tenderness.  Abdominal: Soft. There is no tenderness.  Musculoskeletal: Normal range of motion.  4+ edema both legs.  Legs are diffusely tender to palpation.  There are no areas of apparent cellulitis, fluctuance or localized tenderness.  There are some excoriated areas on the right posterior calf region.  These are draining a small amount of clear and bloody fluid.  Neurological: She is alert and oriented to person, place, and time. No cranial nerve deficit or sensory deficit. She exhibits normal muscle tone. Coordination normal.  Skin: Skin is warm, dry and intact.  Psychiatric: She has a normal mood and affect. Her behavior is normal. Judgment and thought content normal.  Nursing note and vitals reviewed.   ED Course  Procedures (including critical care time)   Medications  acetaminophen (TYLENOL) tablet 650 mg (650 mg Oral Given 10/14/14 1815)    Patient Vitals for the past 24 hrs:  BP Temp Temp src Pulse Resp SpO2 Height Weight  10/14/14 2014 - 97.9 F (36.6 C) Oral - - - - -  10/14/14 1900 137/65 mmHg - - 61 11 100 % - -  10/14/14 1845 142/60 mmHg - - 60 15 100 % - -  10/14/14 1830 135/99 mmHg - - 62 16 100 % - -  10/14/14 1800 131/59 mmHg - - 62 20 98 % - -  10/14/14 1745 (!) 143/53 mmHg - - (!) 57 11 100 % - -  10/14/14 1715 (!) 139/45 mmHg - - (!) 57 13 100 % - -  10/14/14 1700 (!) 152/50 mmHg - -  67 - 98 % - -  10/14/14 1645 148/58 mmHg - - 64 - 100 % - -  10/14/14 1615 154/65 mmHg - - 66 17 100 % - -  10/14/14 1507 - - - - - - - (!) 358 lb 4.8 oz (162.524 kg)  10/14/14  1500 171/60 mmHg - - 68 17 100 % - -  10/14/14 1458 159/62 mmHg 97.7 F (36.5 C) Oral 67 16 100 % 5\' 6"  (1.676 m) -    At discharge Reevaluation with update and discussion. After initial assessment and treatment, an updated evaluation reveals no further complaints.  Findings discussed with patient, all questions answered. Tymia Streb L    Labs Review Labs Reviewed  COMPREHENSIVE METABOLIC PANEL - Abnormal; Notable for the following:    Glucose, Bld 177 (*)    BUN 33 (*)    Creatinine, Ser 1.91 (*)    Calcium 8.3 (*)    Albumin 2.3 (*)    GFR calc non Af Amer 26 (*)    GFR calc Af Amer 30 (*)    All other components within normal limits  CBC WITH DIFFERENTIAL/PLATELET - Abnormal; Notable for the following:    RBC 3.21 (*)    Hemoglobin 8.8 (*)    HCT 28.1 (*)    Eosinophils Relative 6 (*)    All other components within normal limits  URINE CULTURE    HEMOGLOBIN  Date Value Ref Range Status  10/14/2014 8.8* 12.0 - 15.0 g/dL Final  09/09/2014 8.6* 12.0 - 15.0 g/dL Final  09/08/2014 8.1* 12.0 - 15.0 g/dL Final  09/07/2014 7.8* 12.0 - 15.0 g/dL Final   BUN  Date Value Ref Range Status  10/14/2014 33* 6 - 23 mg/dL Final  09/09/2014 24* 6 - 23 mg/dL Final  09/08/2014 29* 6 - 23 mg/dL Final  09/07/2014 34* 6 - 23 mg/dL Final   CREATININE, SER  Date Value Ref Range Status  10/14/2014 1.91* 0.50 - 1.10 mg/dL Final  09/09/2014 1.21* 0.50 - 1.10 mg/dL Final  09/08/2014 1.30* 0.50 - 1.10 mg/dL Final  09/07/2014 1.37* 0.50 - 1.10 mg/dL Final      Imaging Review No results found.   EKG Interpretation   Date/Time:  Thursday October 14 2014 15:03:13 EST Ventricular Rate:  69 PR Interval:  156 QRS Duration: 143 QT Interval:  488 QTC Calculation: 523 R Axis:   114 Text Interpretation:  Sinus rhythm Right bundle branch block since last  tracing no significant change Confirmed by Eulis Foster  MD, Deshannon Seide ([id number]) on  10/14/2014 4:40:09 PM      MDM   Final diagnoses:   Peripheral edema      Morbidly obese patient with severe leg edema.  Evaluation, ED today is reassuring.  She has mildly worsening renal function, indicating mild dehydration.  There is no respiratory distress.  Doubt cellulitis, metabolic instability or impending vascular collapse.  Nursing Notes Reviewed/ Care Coordinated Applicable Imaging Reviewed Interpretation of Laboratory Data incorporated into ED treatment  The patient appears reasonably screened and/or stabilized for discharge and I doubt any other medical condition or other [facility] requiring further screening, evaluation, or treatment in the ED at this time prior to discharge.  Plan: Home Medications- usual; Home Treatments- rest, local wound care, right leg, by patient home; return here if the recommended treatment, does not improve the symptoms; Recommended follow up- PCP asap   Richarda Blade, MD 10/15/14 [phone]

## 2014-10-16 LAB — URINE CULTURE
Colony Count: >=100000
Special Requests: NORMAL

## 2014-10-17 NOTE — Progress Notes (Signed)
Post ED Visit - Positive Culture Follow-up  Culture report reviewed by antimicrobial stewardship pharmacist: []  Wes Dulaney, Pharm.D., BCPS []  Heide Guile, Pharm.D., BCPS []  Alycia Rossetti, Pharm.D., BCPS []  [place], Pharm.D., BCPS, AAHIVP []  Legrand Como, Pharm.D., BCPS, AAHIVP []  Isac Sarna, Pharm.D., BCPS  Positive Urine culture No urinary symptoms noted.  No treatment recommended.  ED Provider: Jeannett Senior, PA-C  Norva Riffle 10/17/2014, 9:35 AM

## 2014-10-29 ENCOUNTER — Other Ambulatory Visit (HOSPITAL_COMMUNITY): Payer: Self-pay | Admitting: *Deleted

## 2014-11-01 ENCOUNTER — Encounter (HOSPITAL_COMMUNITY)
Admission: RE | Admit: 2014-11-01 | Discharge: 2014-11-01 | Disposition: A | Discharge status: Home / Self Care | Payer: Medicare Other | Source: Ambulatory Visit | Attending: Nephrology | Admitting: Nephrology

## 2014-11-01 DIAGNOSIS — D509 Iron deficiency anemia, unspecified: Secondary | ICD-10-CM | POA: Diagnosis present

## 2014-11-01 DIAGNOSIS — Z5181 Encounter for therapeutic drug level monitoring: Secondary | ICD-10-CM | POA: Insufficient documentation

## 2014-11-01 DIAGNOSIS — I129 Hypertensive chronic kidney disease with stage 1 through stage 4 chronic kidney disease, or unspecified chronic kidney disease: Secondary | ICD-10-CM | POA: Diagnosis not present

## 2014-11-01 DIAGNOSIS — D631 Anemia in chronic kidney disease: Secondary | ICD-10-CM | POA: Insufficient documentation

## 2014-11-01 DIAGNOSIS — N183 Chronic kidney disease, stage 3 (moderate): Secondary | ICD-10-CM | POA: Insufficient documentation

## 2014-11-01 LAB — POCT HEMOGLOBIN-HEMACUE: Hemoglobin: 9.3 g/dL — ABNORMAL LOW (ref 12.0–15.0)

## 2014-11-01 MED ORDER — EPOETIN ALFA 10000 UNIT/ML IJ SOLN
10000.0000 [IU] | INTRAMUSCULAR | Status: DC
  Administered 2014-11-01: 10000 [IU] via SUBCUTANEOUS

## 2014-11-01 MED ORDER — EPOETIN ALFA 10000 UNIT/ML IJ SOLN
INTRAMUSCULAR | Status: AC
  Filled 2014-11-01: qty 1

## 2014-11-01 MED ORDER — SODIUM CHLORIDE 0.9 % IV SOLN
510.0000 mg | Freq: Once | INTRAVENOUS | Status: AC
  Administered 2014-11-01: 510 mg via INTRAVENOUS
  Filled 2014-11-01: qty 17

## 2014-11-09 ENCOUNTER — Encounter (HOSPITAL_COMMUNITY)
Admission: RE | Admit: 2014-11-09 | Discharge: 2014-11-09 | Disposition: A | Discharge status: Home / Self Care | Payer: Medicare Other | Source: Ambulatory Visit | Attending: Nephrology | Admitting: Nephrology

## 2014-11-09 DIAGNOSIS — N183 Chronic kidney disease, stage 3 (moderate): Secondary | ICD-10-CM | POA: Diagnosis not present

## 2014-11-09 DIAGNOSIS — I129 Hypertensive chronic kidney disease with stage 1 through stage 4 chronic kidney disease, or unspecified chronic kidney disease: Secondary | ICD-10-CM | POA: Insufficient documentation

## 2014-11-09 DIAGNOSIS — D631 Anemia in chronic kidney disease: Secondary | ICD-10-CM | POA: Diagnosis not present

## 2014-11-09 DIAGNOSIS — D509 Iron deficiency anemia, unspecified: Secondary | ICD-10-CM | POA: Diagnosis present

## 2014-11-09 LAB — POCT HEMOGLOBIN-HEMACUE: Hemoglobin: 9.8 g/dL — ABNORMAL LOW (ref 12.0–15.0)

## 2014-11-09 MED ORDER — EPOETIN ALFA 10000 UNIT/ML IJ SOLN
10000.0000 [IU] | INTRAMUSCULAR | Status: DC
  Administered 2014-11-09: 10000 [IU] via SUBCUTANEOUS

## 2014-11-10 MED ORDER — EPOETIN ALFA 10000 UNIT/ML IJ SOLN
INTRAMUSCULAR | Status: AC
  Filled 2014-11-10: qty 1

## 2014-11-16 ENCOUNTER — Encounter (HOSPITAL_COMMUNITY)
Admission: RE | Admit: 2014-11-16 | Discharge: 2014-11-16 | Disposition: A | Discharge status: Home / Self Care | Payer: Medicare Other | Source: Ambulatory Visit | Attending: Nephrology | Admitting: Nephrology

## 2014-11-16 DIAGNOSIS — D631 Anemia in chronic kidney disease: Secondary | ICD-10-CM | POA: Diagnosis not present

## 2014-11-16 LAB — POCT HEMOGLOBIN-HEMACUE: Hemoglobin: 10.2 g/dL — ABNORMAL LOW (ref 12.0–15.0)

## 2014-11-16 MED ORDER — EPOETIN ALFA 10000 UNIT/ML IJ SOLN: 10000.0000 [IU] | INTRAMUSCULAR | Status: DC

## 2014-11-16 MED ORDER — EPOETIN ALFA 10000 UNIT/ML IJ SOLN
INTRAMUSCULAR | Status: AC
  Administered 2014-11-16: 10000 [IU] via SUBCUTANEOUS
  Filled 2014-11-16: qty 1

## 2014-11-23 ENCOUNTER — Inpatient Hospital Stay (HOSPITAL_COMMUNITY): Payer: Medicare Other

## 2014-11-23 ENCOUNTER — Other Ambulatory Visit: Payer: Self-pay

## 2014-11-23 ENCOUNTER — Encounter (HOSPITAL_COMMUNITY)
Admission: RE | Admit: 2014-11-23 | Discharge: 2014-11-23 | Disposition: A | Discharge status: Home / Self Care | Payer: Medicare Other | Source: Ambulatory Visit | Attending: Nephrology | Admitting: Nephrology

## 2014-11-23 ENCOUNTER — Inpatient Hospital Stay (HOSPITAL_COMMUNITY)
Admission: EM | Admit: 2014-11-23 | Discharge: 2014-11-26 | DRG: 292 | Disposition: A | Discharge status: Home / Self Care | Payer: Medicare Other | Attending: Internal Medicine | Admitting: Internal Medicine

## 2014-11-23 ENCOUNTER — Emergency Department (HOSPITAL_COMMUNITY): Payer: Medicare Other

## 2014-11-23 ENCOUNTER — Encounter (HOSPITAL_COMMUNITY): Payer: Self-pay | Admitting: *Deleted

## 2014-11-23 ENCOUNTER — Other Ambulatory Visit (HOSPITAL_COMMUNITY): Payer: Self-pay

## 2014-11-23 DIAGNOSIS — N039 Chronic nephritic syndrome with unspecified morphologic changes: Secondary | ICD-10-CM

## 2014-11-23 DIAGNOSIS — K219 Gastro-esophageal reflux disease without esophagitis: Secondary | ICD-10-CM | POA: Diagnosis present

## 2014-11-23 DIAGNOSIS — Z87891 Personal history of nicotine dependence: Secondary | ICD-10-CM | POA: Diagnosis not present

## 2014-11-23 DIAGNOSIS — Z86711 Personal history of pulmonary embolism: Secondary | ICD-10-CM

## 2014-11-23 DIAGNOSIS — Z9841 Cataract extraction status, right eye: Secondary | ICD-10-CM | POA: Diagnosis not present

## 2014-11-23 DIAGNOSIS — I890 Lymphedema, not elsewhere classified: Secondary | ICD-10-CM | POA: Diagnosis present

## 2014-11-23 DIAGNOSIS — S81009D Unspecified open wound, unspecified knee, subsequent encounter: Secondary | ICD-10-CM

## 2014-11-23 DIAGNOSIS — I5032 Chronic diastolic (congestive) heart failure: Secondary | ICD-10-CM | POA: Diagnosis not present

## 2014-11-23 DIAGNOSIS — Z22322 Carrier or suspected carrier of Methicillin resistant Staphylococcus aureus: Secondary | ICD-10-CM | POA: Diagnosis not present

## 2014-11-23 DIAGNOSIS — D631 Anemia in chronic kidney disease: Secondary | ICD-10-CM | POA: Diagnosis present

## 2014-11-23 DIAGNOSIS — E119 Type 2 diabetes mellitus without complications: Secondary | ICD-10-CM | POA: Diagnosis present

## 2014-11-23 DIAGNOSIS — S71109A Unspecified open wound, unspecified thigh, initial encounter: Secondary | ICD-10-CM | POA: Diagnosis present

## 2014-11-23 DIAGNOSIS — S91009D Unspecified open wound, unspecified ankle, subsequent encounter: Secondary | ICD-10-CM

## 2014-11-23 DIAGNOSIS — I10 Essential (primary) hypertension: Secondary | ICD-10-CM | POA: Diagnosis present

## 2014-11-23 DIAGNOSIS — S81809D Unspecified open wound, unspecified lower leg, subsequent encounter: Secondary | ICD-10-CM

## 2014-11-23 DIAGNOSIS — E1149 Type 2 diabetes mellitus with other diabetic neurological complication: Secondary | ICD-10-CM | POA: Diagnosis present

## 2014-11-23 DIAGNOSIS — F329 Major depressive disorder, single episode, unspecified: Secondary | ICD-10-CM | POA: Diagnosis present

## 2014-11-23 DIAGNOSIS — R079 Chest pain, unspecified: Secondary | ICD-10-CM | POA: Diagnosis not present

## 2014-11-23 DIAGNOSIS — R0989 Other specified symptoms and signs involving the circulatory and respiratory systems: Secondary | ICD-10-CM | POA: Diagnosis present

## 2014-11-23 DIAGNOSIS — I129 Hypertensive chronic kidney disease with stage 1 through stage 4 chronic kidney disease, or unspecified chronic kidney disease: Secondary | ICD-10-CM | POA: Diagnosis present

## 2014-11-23 DIAGNOSIS — Z789 Other specified health status: Secondary | ICD-10-CM

## 2014-11-23 DIAGNOSIS — N183 Chronic kidney disease, stage 3 unspecified: Secondary | ICD-10-CM | POA: Diagnosis present

## 2014-11-23 DIAGNOSIS — M109 Gout, unspecified: Secondary | ICD-10-CM | POA: Diagnosis present

## 2014-11-23 DIAGNOSIS — Z961 Presence of intraocular lens: Secondary | ICD-10-CM | POA: Diagnosis present

## 2014-11-23 DIAGNOSIS — I5033 Acute on chronic diastolic (congestive) heart failure: Secondary | ICD-10-CM | POA: Diagnosis not present

## 2014-11-23 DIAGNOSIS — G43909 Migraine, unspecified, not intractable, without status migrainosus: Secondary | ICD-10-CM | POA: Diagnosis present

## 2014-11-23 DIAGNOSIS — I272 Other secondary pulmonary hypertension: Secondary | ICD-10-CM | POA: Diagnosis present

## 2014-11-23 DIAGNOSIS — E785 Hyperlipidemia, unspecified: Secondary | ICD-10-CM | POA: Diagnosis present

## 2014-11-23 DIAGNOSIS — E8770 Fluid overload, unspecified: Secondary | ICD-10-CM | POA: Diagnosis present

## 2014-11-23 DIAGNOSIS — Z86718 Personal history of other venous thrombosis and embolism: Secondary | ICD-10-CM

## 2014-11-23 DIAGNOSIS — E1140 Type 2 diabetes mellitus with diabetic neuropathy, unspecified: Secondary | ICD-10-CM | POA: Diagnosis present

## 2014-11-23 DIAGNOSIS — G4730 Sleep apnea, unspecified: Secondary | ICD-10-CM | POA: Diagnosis present

## 2014-11-23 DIAGNOSIS — R071 Chest pain on breathing: Secondary | ICD-10-CM

## 2014-11-23 DIAGNOSIS — G629 Polyneuropathy, unspecified: Secondary | ICD-10-CM | POA: Diagnosis present

## 2014-11-23 DIAGNOSIS — I209 Angina pectoris, unspecified: Secondary | ICD-10-CM

## 2014-11-23 DIAGNOSIS — Z6841 Body Mass Index (BMI) 40.0 and over, adult: Secondary | ICD-10-CM | POA: Diagnosis not present

## 2014-11-23 DIAGNOSIS — D638 Anemia in other chronic diseases classified elsewhere: Secondary | ICD-10-CM | POA: Diagnosis present

## 2014-11-23 DIAGNOSIS — R0789 Other chest pain: Secondary | ICD-10-CM | POA: Diagnosis not present

## 2014-11-23 DIAGNOSIS — Z881 Allergy status to other antibiotic agents status: Secondary | ICD-10-CM

## 2014-11-23 DIAGNOSIS — Z880 Allergy status to penicillin: Secondary | ICD-10-CM

## 2014-11-23 DIAGNOSIS — I82409 Acute embolism and thrombosis of unspecified deep veins of unspecified lower extremity: Secondary | ICD-10-CM | POA: Diagnosis present

## 2014-11-23 DIAGNOSIS — M1990 Unspecified osteoarthritis, unspecified site: Secondary | ICD-10-CM | POA: Diagnosis present

## 2014-11-23 LAB — CBC WITH DIFFERENTIAL/PLATELET
BASOS PCT: 0 % (ref 0–1)
Basophils Absolute: 0 10*3/uL (ref 0.0–0.1)
EOS ABS: 0.4 10*3/uL (ref 0.0–0.7)
Eosinophils Relative: 8 % — ABNORMAL HIGH (ref 0–5)
HCT: 32.1 % — ABNORMAL LOW (ref 36.0–46.0)
Hemoglobin: 10.1 g/dL — ABNORMAL LOW (ref 12.0–15.0)
LYMPHS ABS: 1.1 10*3/uL (ref 0.7–4.0)
LYMPHS PCT: 26 % (ref 12–46)
MCH: 28.5 pg (ref 26.0–34.0)
MCHC: 31.5 g/dL (ref 30.0–36.0)
MCV: 90.4 fL (ref 78.0–100.0)
Monocytes Absolute: 0.5 10*3/uL (ref 0.1–1.0)
Monocytes Relative: 12 % (ref 3–12)
Neutro Abs: 2.3 10*3/uL (ref 1.7–7.7)
Neutrophils Relative %: 54 % (ref 43–77)
PLATELETS: 291 10*3/uL (ref 150–400)
RBC: 3.55 MIL/uL — ABNORMAL LOW (ref 3.87–5.11)
RDW: 15.8 % — ABNORMAL HIGH (ref 11.5–15.5)
WBC: 4.3 10*3/uL (ref 4.0–10.5)

## 2014-11-23 LAB — APTT: APTT: 26 s (ref 24–37)

## 2014-11-23 LAB — CBC
HCT: 36.6 % (ref 36.0–46.0)
Hemoglobin: 11.2 g/dL — ABNORMAL LOW (ref 12.0–15.0)
MCH: 27.7 pg (ref 26.0–34.0)
MCHC: 30.6 g/dL (ref 30.0–36.0)
MCV: 90.6 fL (ref 78.0–100.0)
Platelets: 237 10*3/uL (ref 150–400)
RBC: 4.04 MIL/uL (ref 3.87–5.11)
RDW: 15.7 % — AB (ref 11.5–15.5)
WBC: 5 10*3/uL (ref 4.0–10.5)

## 2014-11-23 LAB — URINALYSIS, ROUTINE W REFLEX MICROSCOPIC
Bilirubin Urine: NEGATIVE
GLUCOSE, UA: NEGATIVE mg/dL
HGB URINE DIPSTICK: NEGATIVE
KETONES UR: NEGATIVE mg/dL
Leukocytes, UA: NEGATIVE
Nitrite: NEGATIVE
PH: 7 (ref 5.0–8.0)
Protein, ur: 30 mg/dL — AB
SPECIFIC GRAVITY, URINE: 1.008 (ref 1.005–1.030)
UROBILINOGEN UA: 0.2 mg/dL (ref 0.0–1.0)

## 2014-11-23 LAB — PROTIME-INR
INR: 1.11 (ref 0.00–1.49)
Prothrombin Time: 14.4 seconds (ref 11.6–15.2)

## 2014-11-23 LAB — BASIC METABOLIC PANEL
Anion gap: 11 (ref 5–15)
BUN: 30 mg/dL — AB (ref 6–23)
CALCIUM: 8.7 mg/dL (ref 8.4–10.5)
CHLORIDE: 104 mmol/L (ref 96–112)
CO2: 25 mmol/L (ref 19–32)
Creatinine, Ser: 1.69 mg/dL — ABNORMAL HIGH (ref 0.50–1.10)
GFR calc Af Amer: 35 mL/min — ABNORMAL LOW (ref >90)
GFR calc non Af Amer: 30 mL/min — ABNORMAL LOW (ref >90)
Glucose, Bld: 157 mg/dL — ABNORMAL HIGH (ref 70–99)
Potassium: 4.2 mmol/L (ref 3.5–5.1)
Sodium: 140 mmol/L (ref 135–145)

## 2014-11-23 LAB — IRON AND TIBC
Iron: 58 ug/dL (ref 42–145)
Saturation Ratios: 22 % (ref 20–55)
TIBC: 261 ug/dL (ref 250–470)
UIBC: 203 ug/dL (ref 125–400)

## 2014-11-23 LAB — FERRITIN: Ferritin: 488 ng/mL — ABNORMAL HIGH (ref 10–291)

## 2014-11-23 LAB — TSH: TSH: 0.793 u[IU]/mL (ref 0.350–4.500)

## 2014-11-23 LAB — URINE MICROSCOPIC-ADD ON

## 2014-11-23 LAB — POCT HEMOGLOBIN-HEMACUE: Hemoglobin: 11.3 g/dL — ABNORMAL LOW (ref 12.0–15.0)

## 2014-11-23 LAB — I-STAT TROPONIN, ED: TROPONIN I, POC: 0 ng/mL (ref 0.00–0.08)

## 2014-11-23 LAB — TROPONIN I: TROPONIN I: 0.03 ng/mL (ref <0.031)

## 2014-11-23 LAB — BRAIN NATRIURETIC PEPTIDE: B NATRIURETIC PEPTIDE 5: 194 pg/mL — AB (ref 0.0–100.0)

## 2014-11-23 MED ORDER — HYDROXYZINE HCL 25 MG PO TABS
25.0000 mg | ORAL_TABLET | Freq: Three times a day (TID) | ORAL | Status: DC | PRN
  Administered 2014-11-25 – 2014-11-26 (×2): 25 mg via ORAL
  Filled 2014-11-23 (×2): qty 1

## 2014-11-23 MED ORDER — ACETAMINOPHEN 650 MG RE SUPP: 650.0000 mg | Freq: Four times a day (QID) | RECTAL | Status: DC | PRN

## 2014-11-23 MED ORDER — OXYCODONE-ACETAMINOPHEN 5-325 MG PO TABS
1.0000 | ORAL_TABLET | Freq: Once | ORAL | Status: AC
  Administered 2014-11-23: 1 via ORAL
  Filled 2014-11-23: qty 1

## 2014-11-23 MED ORDER — ISOSORBIDE MONONITRATE 10 MG PO TABS
10.0000 mg | ORAL_TABLET | Freq: Two times a day (BID) | ORAL | Status: DC
  Administered 2014-11-23 – 2014-11-26 (×6): 10 mg via ORAL
  Filled 2014-11-23 (×9): qty 1

## 2014-11-23 MED ORDER — PROMETHAZINE HCL 25 MG PO TABS
12.5000 mg | ORAL_TABLET | Freq: Four times a day (QID) | ORAL | Status: DC | PRN
Start: 2014-11-23 — End: 2014-11-26

## 2014-11-23 MED ORDER — FUROSEMIDE 10 MG/ML IJ SOLN
40.0000 mg | Freq: Two times a day (BID) | INTRAMUSCULAR | Status: DC
  Administered 2014-11-24 (×2): 40 mg via INTRAVENOUS
  Filled 2014-11-23 (×3): qty 4

## 2014-11-23 MED ORDER — METOPROLOL TARTRATE 50 MG PO TABS
75.0000 mg | ORAL_TABLET | Freq: Two times a day (BID) | ORAL | Status: DC
  Administered 2014-11-23 – 2014-11-26 (×6): 75 mg via ORAL
  Filled 2014-11-23 (×12): qty 1

## 2014-11-23 MED ORDER — COLCHICINE 0.6 MG PO TABS
1.2000 mg | ORAL_TABLET | Freq: Every day | ORAL | Status: DC
  Administered 2014-11-24 – 2014-11-26 (×3): 1.2 mg via ORAL
  Filled 2014-11-23 (×3): qty 2

## 2014-11-23 MED ORDER — FUROSEMIDE 10 MG/ML IJ SOLN
40.0000 mg | Freq: Every day | INTRAMUSCULAR | Status: DC
  Administered 2014-11-23: 40 mg via INTRAVENOUS
  Filled 2014-11-23: qty 4

## 2014-11-23 MED ORDER — ENOXAPARIN SODIUM 40 MG/0.4ML ~~LOC~~ SOLN
40.0000 mg | SUBCUTANEOUS | Status: DC
  Administered 2014-11-23 – 2014-11-25 (×3): 40 mg via SUBCUTANEOUS
  Filled 2014-11-23 (×3): qty 0.4

## 2014-11-23 MED ORDER — EPOETIN ALFA 10000 UNIT/ML IJ SOLN
10000.0000 [IU] | INTRAMUSCULAR | Status: DC
  Administered 2014-11-23: 10000 [IU] via SUBCUTANEOUS

## 2014-11-23 MED ORDER — TECHNETIUM TC 99M DIETHYLENETRIAME-PENTAACETIC ACID: 39.3000 | Freq: Once | INTRAVENOUS | Status: AC | PRN

## 2014-11-23 MED ORDER — ONDANSETRON HCL 4 MG/2ML IJ SOLN
4.0000 mg | Freq: Once | INTRAMUSCULAR | Status: AC
  Administered 2014-11-23: 4 mg via INTRAVENOUS
  Filled 2014-11-23: qty 2

## 2014-11-23 MED ORDER — ACETAMINOPHEN 325 MG PO TABS: 650.0000 mg | ORAL_TABLET | Freq: Four times a day (QID) | ORAL | Status: DC | PRN

## 2014-11-23 MED ORDER — FUROSEMIDE 10 MG/ML IJ SOLN
40.0000 mg | Freq: Once | INTRAMUSCULAR | Status: AC
  Administered 2014-11-23: 40 mg via INTRAVENOUS
  Filled 2014-11-23: qty 4

## 2014-11-23 MED ORDER — OXYCODONE HCL 5 MG PO TABS
10.0000 mg | ORAL_TABLET | ORAL | Status: DC | PRN
  Administered 2014-11-23 – 2014-11-26 (×11): 10 mg via ORAL
  Filled 2014-11-23 (×11): qty 2

## 2014-11-23 MED ORDER — POLYETHYLENE GLYCOL 3350 17 G PO PACK
17.0000 g | PACK | Freq: Every day | ORAL | Status: DC
  Administered 2014-11-25 – 2014-11-26 (×2): 17 g via ORAL
  Filled 2014-11-23 (×5): qty 1

## 2014-11-23 MED ORDER — SODIUM CHLORIDE 0.9 % IJ SOLN
3.0000 mL | Freq: Two times a day (BID) | INTRAMUSCULAR | Status: DC
  Administered 2014-11-23 – 2014-11-26 (×5): 3 mL via INTRAVENOUS

## 2014-11-23 MED ORDER — SENNA 8.6 MG PO TABS
1.0000 | ORAL_TABLET | Freq: Two times a day (BID) | ORAL | Status: DC
  Administered 2014-11-23 – 2014-11-26 (×6): 8.6 mg via ORAL
  Filled 2014-11-23 (×6): qty 1

## 2014-11-23 MED ORDER — SODIUM CHLORIDE 0.9 % IV SOLN: INTRAVENOUS | Status: DC

## 2014-11-23 MED ORDER — ZOLPIDEM TARTRATE 5 MG PO TABS
5.0000 mg | ORAL_TABLET | Freq: Every evening | ORAL | Status: DC | PRN
  Filled 2014-11-23: qty 1

## 2014-11-23 MED ORDER — EPOETIN ALFA 10000 UNIT/ML IJ SOLN
INTRAMUSCULAR | Status: AC
  Filled 2014-11-23: qty 1

## 2014-11-23 MED ORDER — TECHNETIUM TO 99M ALBUMIN AGGREGATED
5.4000 | Freq: Once | INTRAVENOUS | Status: AC | PRN
  Administered 2014-11-23: 5 via INTRAVENOUS

## 2014-11-23 MED ORDER — PANTOPRAZOLE SODIUM 40 MG PO TBEC
40.0000 mg | DELAYED_RELEASE_TABLET | Freq: Every day | ORAL | Status: DC
  Administered 2014-11-24 – 2014-11-26 (×3): 40 mg via ORAL
  Filled 2014-11-23 (×3): qty 1

## 2014-11-23 MED ORDER — OXYCODONE HCL 10 MG PO TABS: 10.0000 mg | ORAL_TABLET | ORAL | Status: DC | PRN

## 2014-11-23 MED ORDER — GABAPENTIN 300 MG PO CAPS
300.0000 mg | ORAL_CAPSULE | Freq: Three times a day (TID) | ORAL | Status: DC
  Administered 2014-11-23 – 2014-11-26 (×8): 300 mg via ORAL
  Filled 2014-11-23 (×8): qty 1

## 2014-11-23 MED ORDER — ASPIRIN 81 MG PO CHEW
324.0000 mg | CHEWABLE_TABLET | Freq: Once | ORAL | Status: AC
  Administered 2014-11-23: 324 mg via ORAL
  Filled 2014-11-23: qty 4

## 2014-11-23 MED ORDER — SACCHAROMYCES BOULARDII 250 MG PO CAPS
250.0000 mg | ORAL_CAPSULE | ORAL | Status: DC
  Administered 2014-11-24 – 2014-11-26 (×2): 250 mg via ORAL
  Filled 2014-11-23 (×2): qty 1

## 2014-11-23 MED ORDER — VITAMIN D 1000 UNITS PO TABS
1000.0000 [IU] | ORAL_TABLET | Freq: Every day | ORAL | Status: DC
  Administered 2014-11-24 – 2014-11-26 (×3): 1000 [IU] via ORAL
  Filled 2014-11-23 (×3): qty 1

## 2014-11-23 NOTE — ED Notes (Signed)
Patient is not in the room I will collect labs when they return

## 2014-11-23 NOTE — H&P (Signed)
[facility] History and Physical  Kelly [name] NWG:[id number] DOB: [birth year] DOA: 11/23/2014  Referring physician: ER physician PCP: Alvester Chou, NP   Chief Complaint: shortness of breath, chest pain  HPI:  69 year old female with past medical history of morbid obesity, hypertension, CKD stage 3, chronic diastolic CHF (Last 2 D ECHO 06/2014 with normal EF), DVT has IVC filter who presented to [facility] ED with worsening shortness of breath especially when she lays down flat. She also reported intermittent mid sternal chest pain, non radiating, about 5/10 in intensity when it is present, at rest and not relieved with analgesia at home. Symptoms started the day prior to the admission. No palpitations, no fevers, chills. No abdominal pain, nausea or vomiting. No falls, no lightheadedness. No blood in stool or urine. No GU complaints.  In ED, pt is hemodynamically stable. CXR showed pulmonary vascular congestion. She has lymphedema and her LE are per pt more swollen than usual. Chest pain and shortness of breath got better in ED. BNP was mildly elevated at 194. The troponin was WNL and the 12 lead EKG showed sinus rhythm. She was started on IV lasix and admitted for further evaluation and management of volume overload, pulmonary vascular congestion.   Assessment & Plan    Principal Problem:   Chest pain - Rule out ACS; V/Q scan low probability for pulmonary embolism  - Cycle cardiac enzymes; troponin x 1 WNL - The 12 lead EKG showed sinus rhythm - provide analgesia PRN  Active Problems:   Essential hypertension - Continue imdur, metoprolol and lasix - Monitor on telemetry     Morbid obesity - Body mass index is 54.4 kg/(m^2). - Nutrition consulted     Chronic diastolic heart failure /  Pulmonary vascular congestion - Mild elevation in BNP, 194 but this could be artificially low due ot morbid obesity - Last 2 D ECHO 06/2014 showed normal LV diastolic function parameters, EF 55-60% -  Obtain 2 D ECHO on this admission - Started lasix 40 mg IV BID (monitor renal function while on lasix) - Strict intake and output - Daily weight     Chronic kidney disease, stage 3 - Baseline Cr 1.4 in 08/2014. On this admission 1.69.  - Will continue to monitor renal function while she is on lasix IV    Anemia of chronic renal failure, stage 3 (moderate) - Hemoglobin is 11.2 - No current indications for transfusion     DVT (deep venous thrombosis) - Has IVC filter, does not take AC because in past was not able to afford AC so IVC filter placed instead    Lymphedema, right hip ulcer - Wound care consulted, appreciate their assessment.     DVT prophylaxis - Lovenox subQ ordered   Radiological Exams on Admission: Dg Chest 2 View 11/23/2014   Cardiomegaly. Increased pulmonary vascular congestion since the prior exam appear   Electronically Signed   By: Lorriane Shire M.D.   On: 11/23/2014 13:41    Code Status: Full Family Communication: Plan of care discussed with the patient  Disposition Plan: Admit for further evaluation; telemetry floor   Leisa Lenz, MD  [facility] Pager [phone]  Review of Systems:  Constitutional: Negative for fever, chills and malaise/fatigue. Negative for diaphoresis.  HENT: Negative for hearing loss, ear pain, nosebleeds, congestion, sore throat, neck pain, tinnitus and ear discharge.   Eyes: Negative for blurred vision, double vision, photophobia, pain, discharge and redness.  Respiratory: Negative for cough, hemoptysis, sputum production  Cardiovascular: positive for chest pain, orthopnea, and leg swelling.  Gastrointestinal: Negative for nausea, vomiting and abdominal pain. Negative for heartburn, constipation, blood in stool and melena.  Genitourinary: Negative for dysuria, urgency, frequency, hematuria and flank pain.  Musculoskeletal: Negative for myalgias, back pain, joint pain and falls.  Skin: Negative for itching and rash.   Neurological: Negative for dizziness and weakness. Negative for tingling, tremors, sensory change, speech change, focal weakness, loss of consciousness and headaches.  Endo/Heme/Allergies: Negative for environmental allergies and polydipsia. Does not bruise/bleed easily.  Psychiatric/Behavioral: Negative for suicidal ideas. The patient is not nervous/anxious.      Past Medical History  Diagnosis Date  . Hypertension   . Hyperlipidemia   . CHF (congestive heart failure)   . GERD (gastroesophageal reflux disease)   . Depression   . Lymphedema of leg     left leg  . Pulmonary embolism 02/18/14    diagnosed at [facility] with CT angio chest  . Chronic indwelling Foley catheter   . Heart murmur     "just found out I have one" (04/07/2014)  . DVT (deep venous thrombosis) 01/2014    LLE; "went from my leg to my lungs"  . Pneumonia 1990's X 2  . Sleep apnea     doesn't use cpap machine or 02 at night (04/07/2014)  . Anemia   . History of blood transfusion 1990's X 2    "when they did my surgeries"  . Migraines     "none for years now" (04/07/2014)  . Arthritis     "arms, legs" (04/07/2014)  . Gout   . Urinary incontinence   . Chronic kidney disease (CKD), stage III (moderate)     [name] 04/07/2014  . Neuropathy   . Diabetes mellitus     "at one time" (04/07/2014)   Past Surgical History  Procedure Laterality Date  . Shoulder open rotator cuff repair Right ~ 2000  . Knee arthroscopy Right ?1980's  . Temporal artery biopsy / ligation Right ?1990's  . Colonoscopy with propofol  09/23/2012    Procedure: COLONOSCOPY WITH PROPOFOL;  Surgeon: Jerene Bears, MD;  Location: [facility];  Service: Gastroenterology;  Laterality: N/A;  . Panniculectomy Left 02/08/2014    w/wound debridement @ medial thigh  . Irrigation and debridement abscess Left 02/24/2014    Procedure: MINOR INCISION AND DRAINAGE OF ABSCESS;  Surgeon: Theodoro Kos, DO;  Location: [facility];  Service: Plastics;  Laterality: Left;  .  I&d extremity Left 03/03/2014    Procedure: IRRIGATION AND DEBRIDEMENT LEFT LEG WOUND AND PLACEMENT OF A CELL AND VAC;  Surgeon: Theodoro Kos, DO;  Location: [place];  Service: Plastics;  Laterality: Left;  . Incision and drainage of wound Left 03/16/2014    Procedure: IRRIGATION AND DEBRIDEMENT OF LEFT THIGH WOUND, APPLICATION ACELL;  Surgeon: Irene Limbo, MD;  Location: [place];  Service: Plastics;  Laterality: Left;  . Incision and drainage of wound Left 03/25/2014    Procedure: IRRIGATION AND DEBRIDEMENT OF LEFT LEG WOUND WITH PLACEMENT OF ACELL/VAC;  Surgeon: Theodoro Kos, DO;  Location: [place];  Service: Plastics;  Laterality: Left;  . Incision and drainage of wound Left 03/31/2014    Procedure: IRRIGATION AND DEBRIDEMENT LEFT LEG WOUND WITH PLACEMENT OF A-CELL/VAC;  Surgeon: Theodoro Kos, DO;  Location: [place];  Service: Plastics;  Laterality: Left;  . Cataract extraction w/ intraocular lens implant Right 2014  . Vena cava filter placement  02/2014  . Incision and drainage of wound Left  04/07/2014    Procedure: IRRIGATION AND DEBRIDEMENT LEFT LEG WOUND WITH PLACEMENT OF A CELL AND VAC;  Surgeon: Theodoro Kos, DO;  Location: [place];  Service: Plastics;  Laterality: Left;  . Incision and drainage of wound Left 04/14/2014    Procedure: IRRIGATION AND DEBRIDEMENT WOUND;  Surgeon: Theodoro Kos, DO;  Location: [place];  Service: Plastics;  Laterality: Left;  . Application of a-cell of extremity Left 04/14/2014    Procedure: APPLICATION OF A-CELL OF EXTREMITY;  Surgeon: Theodoro Kos, DO;  Location: [place];  Service: Plastics;  Laterality: Left;  Marland Kitchen Minor application of wound vac Left 04/14/2014    Procedure: MINOR APPLICATION OF WOUND VAC;  Surgeon: Theodoro Kos, DO;  Location: [place];  Service: Plastics;  Laterality: Left;  . Incision and drainage of wound Left 04/21/2014    Procedure: IRRIGATION AND DEBRIDEMENT OF LEFT UPPER LEG WOUND WITH PLACEMENT OF ACELL/VAC;  Surgeon: Theodoro Kos, DO;  Location: [place];  Service: Plastics;  Laterality: Left;   Social History:  reports that she quit smoking about 33 years ago. Her smoking use included Cigarettes. She has a 33 pack-year smoking history. She has never used smokeless tobacco. She reports that she drinks alcohol. She reports that she does not use illicit drugs.  Allergies  Allergen Reactions  . Bactrim [Sulfamethoxazole-Trimethoprim] Other (See Comments)    Hyperkalemia (July 2015)  . Penicillins Hives    Has taken cephalexin 06/2014    Family History:  Family History  Problem Relation Age of Onset  . Emphysema Father   . Asthma Brother   . Heart disease Mother   . Stomach cancer Brother   . Heart disease Maternal Grandmother   . Diabetes Mother   . Diabetes Sister      Prior to Admission medications   Medication Sig Start Date End Date Taking? Authorizing Provider  cholecalciferol (VITAMIN D) 1000 UNITS tablet Take 1,000 Units by mouth daily.   Yes Historical Provider, MD  colchicine 0.6 MG tablet Take 2 tablets by mouth daily. 11/09/14  Yes Historical Provider, MD  esomeprazole (NEXIUM) 40 MG capsule Take 40 mg by mouth daily before breakfast. Take on an empty stomach   Yes Historical Provider, MD  furosemide (LASIX) 40 MG tablet Take 40 mg by mouth 2 (two) times daily.    Yes Historical Provider, MD  gabapentin (NEURONTIN) 300 MG capsule Take 300 mg by mouth 3 (three) times daily.   Yes Historical Provider, MD  hydrOXYzine (ATARAX/VISTARIL) 25 MG tablet Take 1 tablet by mouth 3 (three) times daily as needed for itching.  09/21/14  Yes Historical Provider, MD  isosorbide mononitrate (ISMO,MONOKET) 10 MG tablet Take 10 mg by mouth 2 (two) times daily.   Yes Historical Provider, MD  metoprolol tartrate (LOPRESSOR) 25 MG tablet Take 3 tablets (75 mg total) by mouth 2 (two) times daily. 05/14/14  Yes Orson Eva, MD  Nutritional Supplements (JUVEN NUTRIVIGOR) PACK Take 1 Container by mouth every other day.    Yes Historical Provider, MD   Oxycodone HCl 10 MG TABS Take 1 tablet (10 mg total) by mouth every 4 (four) hours as needed (moderate pain). 07/18/14  Yes Theodis Blaze, MD  polyethylene glycol (MIRALAX / Floria Raveling) packet Take 17 g by mouth daily. Mix with 4-6 oz liquid   Yes Historical Provider, MD  promethazine (PHENERGAN) 25 MG tablet Take 25 mg by mouth every 6 (six) hours as needed for nausea or vomiting.   Yes Historical Provider, MD  saccharomyces boulardii (FLORASTOR) 250 MG capsule Take 1 capsule (250 mg total) by mouth 2 (two) times daily. Patient taking differently: Take 250 mg by mouth every other day.  09/09/14  Yes Simbiso Ranga, MD  senna (SENOKOT) 8.6 MG TABS tablet Take 1 tablet (8.6 mg total) by mouth 2 (two) times daily. 04/09/14  Yes Marianne L [name], PA-C  zolpidem (AMBIEN) 5 MG tablet Take 1 tablet (5 mg total) by mouth at bedtime as needed for sleep. 07/18/14  Yes Theodis Blaze, MD  levofloxacin (LEVAQUIN) 750 MG tablet Take 1 tablet (750 mg total) by mouth daily. 09/09/14   Nat Math, MD   Physical Exam: Filed Vitals:   11/23/14 1151 11/23/14 1253  BP: 181/66 173/62  Pulse: 66 62  Temp: 97.7 F (36.5 C)   TempSrc: Oral   Resp: 18 14  SpO2: 98% 98%    Physical Exam  Constitutional: morbidly obese, appears stated age, no distress HENT: Normocephalic. No tonsillar erythema or exudates Eyes: Conjunctivae and EOM are normal. PERRLA, no scleral icterus.  Neck: Normal ROM. Neck supple. No JVD. No tracheal deviation. No thyromegaly.  CVS: RRR, S1/S2 appreciated.  Pulmonary: diminished breath sounds, no wheezing.  Abdominal: Soft. BS +,  no distension, tenderness, rebound or guarding.  Musculoskeletal: LE lymphedema, superimposed chronic skin changes, +2-3 LE pitting edema. Lymphadenopathy: No lymphadenopathy noted, cervical, inguinal. Neuro: Alert. Normal reflexes, muscle tone coordination. No focal neurologic deficits. Skin: Skin is warm and dry. No rash noted.  No erythema. No pallor.   Psychiatric: Normal mood and affect. Behavior, judgment, thought content normal.   Labs on Admission:  Basic Metabolic Panel:  Recent Labs Lab 11/23/14 1306  NA 140  K 4.2  CL 104  CO2 25  GLUCOSE 157*  BUN 30*  CREATININE 1.69*  CALCIUM 8.7   Liver Function Tests: No results for input(s): AST, ALT, ALKPHOS, BILITOT, PROT, ALBUMIN in the last 168 hours. No results for input(s): LIPASE, AMYLASE in the last 168 hours. No results for input(s): AMMONIA in the last 168 hours. CBC:  Recent Labs Lab 11/23/14 1053 11/23/14 1306  WBC  --  5.0  HGB 11.3* 11.2*  HCT  --  36.6  MCV  --  90.6  PLT  --  237   Cardiac Enzymes: No results for input(s): CKTOTAL, CKMB, CKMBINDEX, TROPONINI in the last 168 hours. BNP: Invalid input(s): POCBNP CBG: No results for input(s): GLUCAP in the last 168 hours.  If 7PM-7AM, please contact night-coverage [email or web address] Password [facility] 11/23/2014, 5:03 PM

## 2014-11-23 NOTE — ED Notes (Addendum)
Pt reports chest pain x4 days ago, reports SOB more than usual. Able to speak in full sentences. Hx of CHF, pt increased her lasix a few weeks ago. Now pt has dizziness. Chest pain 4/10. Leg pain has been going on for a few weeks.  Hx of PE, has IVC filter in  Place now.

## 2014-11-23 NOTE — ED Notes (Signed)
I tried to get blood twice, and was unsuccessful

## 2014-11-23 NOTE — ED Notes (Signed)
Patient in the bathroom I will collect labs when they return to room.

## 2014-11-23 NOTE — ED Notes (Signed)
Spoke to [employer]..the patient can go @ 14:55

## 2014-11-23 NOTE — ED Provider Notes (Signed)
TIME SEEN: 3:30 PM  CHIEF COMPLAINT: Chest pain, shortness of breath, leg swelling and pain  HPI: Pt is a 69 y.o. female with history of hypertension, hyperlipidemia, diabetes, CHF, left lower extremity DVT, prior pulmonary embolus status post IVC filter who is no longer on anticoagulation, chronic kidney disease, neuropathy who presents emergency department with complaints of 4 days of intermittent left-sided chest pain described as a sharp and pressure without radiation and shortness of breath. Pain is worse with exertion and better with rest. Pain however is also slightly atypical and is better at times with palpation of her chest. She has had nausea and dizziness with this pain. Also complains of several weeks of bilateral leg swelling and pain around her ankles that she describes as "needles". She is on gabapentin. It appears patient has had a venous Doppler bilateral lower extremities that showed no acute DVT on 09/04/2014. Denies any recent provocative testing. Denies fever, cough. No vomiting or diarrhea.  ROS: See HPI Constitutional: no fever  Eyes: no drainage  ENT: no runny nose   Cardiovascular:   chest pain  Resp: SOB  GI: no vomiting GU: no dysuria Integumentary: no rash  Allergy: no hives  Musculoskeletal:  leg swelling  Neurological: no slurred speech ROS otherwise negative  PAST MEDICAL HISTORY/PAST SURGICAL HISTORY:  Past Medical History  Diagnosis Date  . Hypertension   . Hyperlipidemia   . CHF (congestive heart failure)   . GERD (gastroesophageal reflux disease)   . Depression   . Lymphedema of leg     left leg  . Pulmonary embolism 02/18/14    diagnosed at [facility] with CT angio chest  . Chronic indwelling Foley catheter   . Heart murmur     "just found out I have one" (04/07/2014)  . DVT (deep venous thrombosis) 01/2014    LLE; "went from my leg to my lungs"  . Pneumonia 1990's X 2  . Sleep apnea     doesn't use cpap machine or 02 at night (04/07/2014)  . Anemia    . History of blood transfusion 1990's X 2    "when they did my surgeries"  . Migraines     "none for years now" (04/07/2014)  . Arthritis     "arms, legs" (04/07/2014)  . Gout   . Urinary incontinence   . Chronic kidney disease (CKD), stage III (moderate)     [name] 04/07/2014  . Neuropathy   . Diabetes mellitus     "at one time" (04/07/2014)    MEDICATIONS:  Prior to Admission medications   Medication Sig Start Date End Date Taking? Authorizing Provider  cholecalciferol (VITAMIN D) 1000 UNITS tablet Take 1,000 Units by mouth daily.   Yes Historical Provider, MD  colchicine 0.6 MG tablet Take 2 tablets by mouth daily. 11/09/14  Yes Historical Provider, MD  esomeprazole (NEXIUM) 40 MG capsule Take 40 mg by mouth daily before breakfast. Take on an empty stomach   Yes Historical Provider, MD  furosemide (LASIX) 40 MG tablet Take 40 mg by mouth 2 (two) times daily.    Yes Historical Provider, MD  gabapentin (NEURONTIN) 300 MG capsule Take 300 mg by mouth 3 (three) times daily.   Yes Historical Provider, MD  hydrOXYzine (ATARAX/VISTARIL) 25 MG tablet Take 1 tablet by mouth 3 (three) times daily as needed for itching.  09/21/14  Yes Historical Provider, MD  isosorbide mononitrate (ISMO,MONOKET) 10 MG tablet Take 10 mg by mouth 2 (two) times daily.   Yes Historical  Provider, MD  metoprolol tartrate (LOPRESSOR) 25 MG tablet Take 3 tablets (75 mg total) by mouth 2 (two) times daily. 05/14/14  Yes Orson Eva, MD  Nutritional Supplements (JUVEN NUTRIVIGOR) PACK Take 1 Container by mouth every other day.    Yes Historical Provider, MD  Oxycodone HCl 10 MG TABS Take 1 tablet (10 mg total) by mouth every 4 (four) hours as needed (moderate pain). 07/18/14  Yes Theodis Blaze, MD  polyethylene glycol (MIRALAX / Floria Raveling) packet Take 17 g by mouth daily. Mix with 4-6 oz liquid   Yes Historical Provider, MD  promethazine (PHENERGAN) 25 MG tablet Take 25 mg by mouth every 6 (six) hours as needed for nausea or  vomiting.   Yes Historical Provider, MD  saccharomyces boulardii (FLORASTOR) 250 MG capsule Take 1 capsule (250 mg total) by mouth 2 (two) times daily. Patient taking differently: Take 250 mg by mouth every other day.  09/09/14  Yes Simbiso Ranga, MD  senna (SENOKOT) 8.6 MG TABS tablet Take 1 tablet (8.6 mg total) by mouth 2 (two) times daily. 04/09/14  Yes Marianne L [name], PA-C  zolpidem (AMBIEN) 5 MG tablet Take 1 tablet (5 mg total) by mouth at bedtime as needed for sleep. 07/18/14  Yes Theodis Blaze, MD  levofloxacin (LEVAQUIN) 750 MG tablet Take 1 tablet (750 mg total) by mouth daily. 09/09/14   Nat Math, MD    ALLERGIES:  Allergies  Allergen Reactions  . Bactrim [Sulfamethoxazole-Trimethoprim] Other (See Comments)    Hyperkalemia (July 2015)  . Penicillins Hives    Has taken cephalexin 06/2014    SOCIAL HISTORY:  History  Substance Use Topics  . Smoking status: Former Smoker -- 1.00 packs/day for 33 years    Types: Cigarettes    Quit date: 08/27/1981  . Smokeless tobacco: Never Used  . Alcohol Use: Yes     Comment: 04/07/2014 "used to drink; stopped in ~ 1983"    FAMILY HISTORY: Family History  Problem Relation Age of Onset  . Emphysema Father   . Asthma Brother   . Heart disease Mother   . Stomach cancer Brother   . Heart disease Maternal Grandmother   . Diabetes Mother   . Diabetes Sister     EXAM: BP 173/62 mmHg  Pulse 62  Temp(Src) 97.7 F (36.5 C) (Oral)  Resp 14  SpO2 98% CONSTITUTIONAL: Alert and oriented and responds appropriately to questions. Well-appearing; well-nourished HEAD: Normocephalic EYES: Conjunctivae clear, PERRL ENT: normal nose; no rhinorrhea; moist mucous membranes; pharynx without lesions noted NECK: Supple, no meningismus, no LAD  CARD: RRR; S1 and S2 appreciated; no murmurs, no clicks, no rubs, no gallops RESP: Normal chest excursion without splinting or tachypnea; breath sounds clear and equal bilaterally; no wheezes, no rhonchi,  no rales, left chest wall is mildly tender to palpation but this does not reproduce patient's pain, no hypoxia or respiratory distress  ABD/GI: Normal bowel sounds; non-distended; soft, non-tender, no rebound, no guarding BACK:  The back appears normal and is non-tender to palpation, there is no CVA tenderness EXT: Normal ROM in all joints;tender to palpation of her bilateral ankles without erythema or warmth, patient has significant leg edema with weeping in his bilateral lower extremities to the mid thigh with multiple areas of skin breakdown without obvious sign of abscess or cellulitis SKIN: Normal color for age and race; warm NEURO: Moves all extremities equally PSYCH: The patient's mood and manner are appropriate. Grooming and personal hygiene are appropriate.  MEDICAL DECISION  MAKING:  Patient here with chest pain and shortness of breath for the past several days intermittently with multiple risk factors for ACS. Also has a history of PE and DVT. Complaining of lower pain that is likely secondary to her chronic but increasing bilateral lower extremity edema but also her neuropathy. Has had a recent negative venous Doppler bilateral lower extremities in January. Given her kidney function patient cannot receive CT scan of her chest. Will obtain VQ scan. Her labs show no new abnormalities. Creatinine is at her baseline. Troponin negative. BNP is mildly elevated at 194. Chest x-ray shows cardiomegaly and increased perivascular congestion. We'll give IV Lasix and place Foley catheter. Patient will need admission.   ED PROGRESS:  Patient admitted to the hospital by Dr. Charlies Silvers.        Date: 11/23/2014 11:56 AM  Rate: 64  Rhythm: normal sinus rhythm  QRS Axis: normal  Intervals: normal  ST/T Wave abnormalities: normal  Conduction Disutrbances: RBBB  Narrative Interpretation: RBBB, nonspecific ST flattening in lateral leads      [facility], DO 11/23/14 1729

## 2014-11-24 DIAGNOSIS — R0789 Other chest pain: Secondary | ICD-10-CM

## 2014-11-24 DIAGNOSIS — R079 Chest pain, unspecified: Secondary | ICD-10-CM

## 2014-11-24 LAB — COMPREHENSIVE METABOLIC PANEL
ALBUMIN: 2.4 g/dL — AB (ref 3.5–5.2)
ALK PHOS: 105 U/L (ref 39–117)
ALT: 11 U/L (ref 0–35)
ALT: 10 U/L (ref 0–35)
ANION GAP: 9 (ref 5–15)
AST: 19 U/L (ref 0–37)
AST: 16 U/L (ref 0–37)
Albumin: 2.7 g/dL — ABNORMAL LOW (ref 3.5–5.2)
Alkaline Phosphatase: 91 U/L (ref 39–117)
Anion gap: 7 (ref 5–15)
BILIRUBIN TOTAL: 0.3 mg/dL (ref 0.3–1.2)
BUN: 28 mg/dL — AB (ref 6–23)
BUN: 28 mg/dL — ABNORMAL HIGH (ref 6–23)
CALCIUM: 8.1 mg/dL — AB (ref 8.4–10.5)
CHLORIDE: 106 mmol/L (ref 96–112)
CO2: 28 mmol/L (ref 19–32)
CO2: 27 mmol/L (ref 19–32)
CREATININE: 1.55 mg/dL — AB (ref 0.50–1.10)
Calcium: 8.6 mg/dL (ref 8.4–10.5)
Chloride: 104 mmol/L (ref 96–112)
Creatinine, Ser: 1.56 mg/dL — ABNORMAL HIGH (ref 0.50–1.10)
GFR calc Af Amer: 39 mL/min — ABNORMAL LOW (ref >90)
GFR calc Af Amer: 39 mL/min — ABNORMAL LOW (ref >90)
GFR calc non Af Amer: 34 mL/min — ABNORMAL LOW (ref >90)
GFR calc non Af Amer: 33 mL/min — ABNORMAL LOW (ref >90)
Glucose, Bld: 146 mg/dL — ABNORMAL HIGH (ref 70–99)
Glucose, Bld: 121 mg/dL — ABNORMAL HIGH (ref 70–99)
Potassium: 4.1 mmol/L (ref 3.5–5.1)
Potassium: 4.1 mmol/L (ref 3.5–5.1)
Sodium: 141 mmol/L (ref 135–145)
Sodium: 140 mmol/L (ref 135–145)
Total Bilirubin: 0.6 mg/dL (ref 0.3–1.2)
Total Protein: 7.7 g/dL (ref 6.0–8.3)
Total Protein: 7.2 g/dL (ref 6.0–8.3)

## 2014-11-24 LAB — CBC
HEMATOCRIT: 31.5 % — AB (ref 36.0–46.0)
Hemoglobin: 9.5 g/dL — ABNORMAL LOW (ref 12.0–15.0)
MCH: 27.3 pg (ref 26.0–34.0)
MCHC: 30.2 g/dL (ref 30.0–36.0)
MCV: 90.5 fL (ref 78.0–100.0)
Platelets: 224 10*3/uL (ref 150–400)
RBC: 3.48 MIL/uL — ABNORMAL LOW (ref 3.87–5.11)
RDW: 16.1 % — ABNORMAL HIGH (ref 11.5–15.5)
WBC: 3.4 10*3/uL — AB (ref 4.0–10.5)

## 2014-11-24 LAB — MRSA PCR SCREENING: MRSA by PCR: POSITIVE — AB

## 2014-11-24 LAB — TROPONIN I
Troponin I: 0.03 ng/mL (ref <0.031)
Troponin I: 0.03 ng/mL (ref <0.031)

## 2014-11-24 LAB — MAGNESIUM: Magnesium: 1.9 mg/dL (ref 1.5–2.5)

## 2014-11-24 LAB — PHOSPHORUS: Phosphorus: 3.9 mg/dL (ref 2.3–4.6)

## 2014-11-24 LAB — GLUCOSE, CAPILLARY: GLUCOSE-CAPILLARY: 101 mg/dL — AB (ref 70–99)

## 2014-11-24 MED ORDER — MUPIROCIN 2 % EX OINT
1.0000 "application " | TOPICAL_OINTMENT | Freq: Two times a day (BID) | CUTANEOUS | Status: DC
  Administered 2014-11-24 – 2014-11-26 (×5): 1 via NASAL
  Filled 2014-11-24: qty 22

## 2014-11-24 MED ORDER — DICLOFENAC SODIUM 1 % TD GEL
2.0000 g | Freq: Four times a day (QID) | TRANSDERMAL | Status: DC
  Administered 2014-11-24 – 2014-11-26 (×7): 2 g via TOPICAL
  Filled 2014-11-24: qty 100

## 2014-11-24 MED ORDER — PRO-STAT SUGAR FREE PO LIQD
30.0000 mL | Freq: Two times a day (BID) | ORAL | Status: DC
  Administered 2014-11-24 – 2014-11-26 (×5): 30 mL via ORAL
  Filled 2014-11-24 (×9): qty 30

## 2014-11-24 MED ORDER — CHLORHEXIDINE GLUCONATE CLOTH 2 % EX PADS
6.0000 | MEDICATED_PAD | Freq: Every day | CUTANEOUS | Status: DC
  Administered 2014-11-25 – 2014-11-26 (×2): 6 via TOPICAL

## 2014-11-24 NOTE — Progress Notes (Signed)
INITIAL NUTRITION ASSESSMENT  DOCUMENTATION CODES Per approved criteria  -Morbid Obesity   INTERVENTION: Provide Prostat BID, each provides 100 kcal and 15 g protein.  NUTRITION DIAGNOSIS:  Increased nutrient needs related to wound healing as evidenced by stage II pressure ulcer.   Goal: Pt to meet >/= 90% of their estimated nutrition needs  Monitor:  Labs, weight trends, PO, I/Os, acceptance of supplements.  Reason for Assessment: Consult for diet education  69 y.o. female  Admitting Dx: Chest pain  ASSESSMENT: 69 year old female with past medical history of morbid obesity, hypertension, CKD stage 3, chronic diastolic CHF (Last 2 D ECHO 06/2014 with normal EF), DVT has IVC filter who presented to [facility] ED with worsening shortness of breath especially when she lays down flat.  - Pt finishing breakfast at the time of visit, consumed 90%.  - Reports some decrease in appetite since surgery last June, but no significant weight loss.   - Currently on regular diet, but tries to avoid salt when cooking, admits to drinking soda more "that I should"  - Will order Prostat to increase protein intake for wound healing  - Nutrition focused physical performed, no sighs of fat or muscle depletion present. - Labs reviewed: BUN 30, Glu 157  Height: Ht Readings from Last 1 Encounters:  10/14/14 5\' 6"  (1.676 m)    Weight: Wt Readings from Last 1 Encounters:  11/24/14 319 lb 14.4 oz (145.106 kg)    Ideal Body Weight: 130 Lb  % Ideal Body Weight: 245%  Wt Readings from Last 10 Encounters:  11/24/14 319 lb 14.4 oz (145.106 kg)  10/14/14 358 lb 4.8 oz (162.524 kg)  09/09/14 323 lb 13.7 oz (146.9 kg)  07/19/14 339 lb 8.1 oz (154 kg)  06/10/14 333 lb (151.048 kg)  06/07/14 340 lb (154.223 kg)  06/02/14 333 lb (151.048 kg)  05/26/14 340 lb (154.223 kg)  05/20/14 340 lb (154.223 kg)  05/18/14 340 lb (154.223 kg)    Usual Body Weight: 340 Lb  % Usual Body Weight: 94%  BMI:  Body  mass index is 51.66 kg/(m^2).  Estimated Nutritional Needs: Kcal: 1800 - 2000 Protein: 120 - 130g Fluid: per MD  Skin: Stage II pressure ulcer on right posterior thigh  Diet Order: Diet regular Room service appropriate?: Yes; Fluid consistency:: Thin  EDUCATION NEEDS: -Education needs addressed   Intake/Output Summary (Last 24 hours) at 11/24/14 1031 Last data filed at 11/24/14 0554  Gross per 24 hour  Intake      0 ml  Output   2250 ml  Net  -2250 ml    Last BM: PTA  Labs:   Recent Labs Lab 11/23/14 1306 11/23/14 2215 11/24/14 0444  NA 140 141 140  K 4.2 4.1 4.1  CL 104 104 106  CO2 25 28 27   BUN 30* 28* 28*  CREATININE 1.69* 1.55* 1.56*  CALCIUM 8.7 8.6 8.1*  MG  --  1.9  --   PHOS  --  3.9  --   GLUCOSE 157* 146* 121*    CBG (last 3)   Recent Labs  11/24/14 0740  GLUCAP 101*    Scheduled Meds: . cholecalciferol  1,000 Units Oral Daily  . colchicine  1.2 mg Oral Daily  . enoxaparin (LOVENOX) injection  40 mg Subcutaneous Q24H  . feeding supplement (PRO-STAT SUGAR FREE 64)  30 mL Oral BID  . furosemide  40 mg Intravenous BID  . gabapentin  300 mg Oral TID  . isosorbide  mononitrate  10 mg Oral BID  . metoprolol tartrate  75 mg Oral BID  . pantoprazole  40 mg Oral Daily  . polyethylene glycol  17 g Oral Daily  . saccharomyces boulardii  250 mg Oral QODAY  . senna  1 tablet Oral BID  . sodium chloride  3 mL Intravenous Q12H    Continuous Infusions: . sodium chloride      Past Medical History  Diagnosis Date  . Hypertension   . Hyperlipidemia   . CHF (congestive heart failure)   . GERD (gastroesophageal reflux disease)   . Depression   . Lymphedema of leg     left leg  . Pulmonary embolism 02/18/14    diagnosed at [facility] with CT angio chest  . Chronic indwelling Foley catheter   . Heart murmur     "just found out I have one" (04/07/2014)  . DVT (deep venous thrombosis) 01/2014    LLE; "went from my leg to my lungs"  . Pneumonia 1990's X  2  . Sleep apnea     doesn't use cpap machine or 02 at night (04/07/2014)  . Anemia   . History of blood transfusion 1990's X 2    "when they did my surgeries"  . Migraines     "none for years now" (04/07/2014)  . Arthritis     "arms, legs" (04/07/2014)  . Gout   . Urinary incontinence   . Chronic kidney disease (CKD), stage III (moderate)     [name] 04/07/2014  . Neuropathy   . Diabetes mellitus     "at one time" (04/07/2014)    Past Surgical History  Procedure Laterality Date  . Shoulder open rotator cuff repair Right ~ 2000  . Knee arthroscopy Right ?1980's  . Temporal artery biopsy / ligation Right ?1990's  . Colonoscopy with propofol  09/23/2012    Procedure: COLONOSCOPY WITH PROPOFOL;  Surgeon: Jerene Bears, MD;  Location: [facility];  Service: Gastroenterology;  Laterality: N/A;  . Panniculectomy Left 02/08/2014    w/wound debridement @ medial thigh  . Irrigation and debridement abscess Left 02/24/2014    Procedure: MINOR INCISION AND DRAINAGE OF ABSCESS;  Surgeon: Theodoro Kos, DO;  Location: [facility];  Service: Plastics;  Laterality: Left;  . I&d extremity Left 03/03/2014    Procedure: IRRIGATION AND DEBRIDEMENT LEFT LEG WOUND AND PLACEMENT OF A CELL AND VAC;  Surgeon: Theodoro Kos, DO;  Location: [place];  Service: Plastics;  Laterality: Left;  . Incision and drainage of wound Left 03/16/2014    Procedure: IRRIGATION AND DEBRIDEMENT OF LEFT THIGH WOUND, APPLICATION ACELL;  Surgeon: Irene Limbo, MD;  Location: [place];  Service: Plastics;  Laterality: Left;  . Incision and drainage of wound Left 03/25/2014    Procedure: IRRIGATION AND DEBRIDEMENT OF LEFT LEG WOUND WITH PLACEMENT OF ACELL/VAC;  Surgeon: Theodoro Kos, DO;  Location: [place];  Service: Plastics;  Laterality: Left;  . Incision and drainage of wound Left 03/31/2014    Procedure: IRRIGATION AND DEBRIDEMENT LEFT LEG WOUND WITH PLACEMENT OF A-CELL/VAC;  Surgeon: Theodoro Kos, DO;  Location: [place];  Service: Plastics;   Laterality: Left;  . Cataract extraction w/ intraocular lens implant Right 2014  . Vena cava filter placement  02/2014  . Incision and drainage of wound Left 04/07/2014    Procedure: IRRIGATION AND DEBRIDEMENT LEFT LEG WOUND WITH PLACEMENT OF A CELL AND VAC;  Surgeon: Theodoro Kos, DO;  Location: [place];  Service: Plastics;  Laterality: Left;  .  Incision and drainage of wound Left 04/14/2014    Procedure: IRRIGATION AND DEBRIDEMENT WOUND;  Surgeon: Theodoro Kos, DO;  Location: [place];  Service: Plastics;  Laterality: Left;  . Application of a-cell of extremity Left 04/14/2014    Procedure: APPLICATION OF A-CELL OF EXTREMITY;  Surgeon: Theodoro Kos, DO;  Location: [place];  Service: Plastics;  Laterality: Left;  Marland Kitchen Minor application of wound vac Left 04/14/2014    Procedure: MINOR APPLICATION OF WOUND VAC;  Surgeon: Theodoro Kos, DO;  Location: [place];  Service: Plastics;  Laterality: Left;  . Incision and drainage of wound Left 04/21/2014    Procedure: IRRIGATION AND DEBRIDEMENT OF LEFT UPPER LEG WOUND WITH PLACEMENT OF ACELL/VAC;  Surgeon: Theodoro Kos, DO;  Location: [place];  Service: Plastics;  Laterality: Left;    Kenzlee Fishburn A. [facility] Dietetic Intern Pager: [phone] 11/24/2014 10:55 AM

## 2014-11-24 NOTE — Progress Notes (Signed)
Echocardiogram 2D Echocardiogram has been performed.  Kelly [name] 11/24/2014, 11:57 AM

## 2014-11-24 NOTE — Plan of Care (Signed)
Problem: Food- and Nutrition-Related Knowledge Deficit (NB-1.1) Goal: Nutrition education Formal process to instruct or train a patient/client in a skill or to impart knowledge to help patients/clients voluntarily manage or modify food choices and eating behavior to maintain or improve health. Outcome: Adequate for Discharge Rd was consulted to provide education on general healthy eating. Dietetic Intern provided "General, Healthful Nutrition Therapy" handout as well as walked the patient through healthy eating pattern using MyPlate method. Pt had previous education by a RD and had some knowledge about healthy eating. Pt states that she avoids salt whenever possible, but loves soda. Encouraged patient to choose healthy food options, with special emphasis on protein as pt admits not liking meat. She has not been on any diet recently, cooks at home and has Meals on Wheels services drop of food every week. Sugested rinsing canned vegetables before using to reduce sodium. Encouraged reducing sweetened beverage intake to special occasions only, and choosing water instead. Provided tips to flavor water without added sugars.  Teach back method used, patient expressed understanding. Expect fair to good compliance.   Kelly [name] A. [facility] Dietetic Intern Pager: [phone] 11/24/2014 10:28 AM

## 2014-11-24 NOTE — Consult Note (Signed)
WOC wound consult note Reason for Consult:Patient known to me from pervious admissions, chronic, non-healing full thickness wound on right posterior thigh. Non-healing stage 3. Wound type: Pressure. Pressure Ulcer POA: Yes Measurement:1cm x 4.5cm x 0.4cm Wound bed:red, moist with closed wound edges Drainage (amount, consistency, odor) scant serous Periwound:intact, macerated Dressing procedure/placement/frequency:I will try a two-week period using a silver (antimicrobial) dressing in an attempt to jump-start this stalled wound.  A pressure redistribution sleep surface with low air loss technology will manage moisture as well. [place] nursing team will not follow, but will remain available to this patient, the nursing and medical team.  Please re-consult if needed. Thanks, Maudie Flakes, MSN, RN, [place], [place], [place] [phone])

## 2014-11-24 NOTE — Progress Notes (Signed)
[facility] PROGRESS NOTE  Kelly [name] NAT:[id number] DOB: [birth year] DOA: 11/23/2014 PCP: Alvester Chou, NP  Assessment/Plan:  Principal Problem:   Chest pain, musculoskeletal: reproducible and MI ruled out. Echo pending. D/c tele.  Try voltaren gel Active Problems:   Essential hypertension   Morbid obesity: walks with walker at baseline and lives alone. Will get PT eval   Chronic diastolic heart failure: continue IV lasix bid.   Chronic kidney disease, stage 3   Anemia of chronic renal failure, stage 3 (moderate)   DVT (deep venous thrombosis), s/p IVC filter   Volume overload   Pulmonary vascular congestion Severe bilateral lower extremity lymphedema  Code Status:  full Family Communication:   Disposition Plan:  home in 1-2 days after PT eval and continued diuresis  Consultants:    Procedures:     Antibiotics:    HPI/Subjective: Still with CP. Worse when bending over. "Pressing on" the area provides relief.   Objective: Filed Vitals:   11/24/14 1416  BP: 132/43  Pulse: 62  Temp: 98.2 F (36.8 C)  Resp: 18    Intake/Output Summary (Last 24 hours) at 11/24/14 1418 Last data filed at 11/24/14 0554  Gross per 24 hour  Intake      0 ml  Output   2250 ml  Net  -2250 ml   Filed Weights   11/23/14 1908 11/24/14 0552  Weight: 152.8 kg (336 lb 13.8 oz) 145.106 kg (319 lb 14.4 oz)    Exam:   General:  Fairly comfortable lying flat. Extreme obesity  Cardiovascular: distant S1, S2 no MGR, regular  Respiratory: CTA without WRR  Abdomen: obese, s, nt,  Ext: bilateral thigh masses consistent with lymphedema and obesity  Basic Metabolic Panel:  Recent Labs Lab 11/23/14 1306 11/23/14 2215 11/24/14 0444  NA 140 141 140  K 4.2 4.1 4.1  CL 104 104 106  CO2 25 28 27   GLUCOSE 157* 146* 121*  BUN 30* 28* 28*  CREATININE 1.69* 1.55* 1.56*  CALCIUM 8.7 8.6 8.1*  MG  --  1.9  --   PHOS  --  3.9  --    Liver Function Tests:  Recent  Labs Lab 11/23/14 2215 11/24/14 0444  AST 19 16  ALT 11 10  ALKPHOS 105 91  BILITOT 0.6 0.3  PROT 7.7 7.2  ALBUMIN 2.7* 2.4*   No results for input(s): LIPASE, AMYLASE in the last 168 hours. No results for input(s): AMMONIA in the last 168 hours. CBC:  Recent Labs Lab 11/23/14 1053 11/23/14 1306 11/23/14 2055 11/24/14 0444  WBC  --  5.0 4.3 3.4*  NEUTROABS  --   --  2.3  --   HGB 11.3* 11.2* 10.1* 9.5*  HCT  --  36.6 32.1* 31.5*  MCV  --  90.6 90.4 90.5  PLT  --  237 291 224   Cardiac Enzymes:  Recent Labs Lab 11/23/14 1755 11/23/14 2215 11/24/14 0444  TROPONINI 0.03 0.03 0.03   BNP (last 3 results)  Recent Labs  09/03/14 0407 11/23/14 1306  BNP 507.4* 194.0*    ProBNP (last 3 results)  Recent Labs  07/11/14 1959  PROBNP 2482.0*    CBG:  Recent Labs Lab 11/24/14 0740  GLUCAP 101*    Recent Results (from the past 240 hour(s))  MRSA PCR Screening     Status: Abnormal   Collection Time: 11/23/14 11:47 AM  Result Value Ref Range Status   MRSA by PCR POSITIVE (A) NEGATIVE Final  Comment:        The GeneXpert MRSA Assay (FDA approved for NASAL specimens only), is one component of a comprehensive MRSA colonization surveillance program. It is not intended to diagnose MRSA infection nor to guide or monitor treatment for MRSA infections. RESULT CALLED TO, READ BACK BY AND VERIFIED WITH: T.NICHOLS,RN AT 0055 ON 11/24/14 BY W.SHEA      Studies: Dg Chest 2 View  11/23/2014   CLINICAL DATA:  Chest pain.  Shortness of breath.  EXAM: CHEST  2 VIEW  COMPARISON:  07/11/2014 and 03/03/2014  FINDINGS: There is cardiomegaly with increased pulmonary vascular congestion. No infiltrates or effusions.No acute osseous abnormality.  IMPRESSION: Cardiomegaly. Increased pulmonary vascular congestion since the prior exam appear   Electronically Signed   By: Lorriane Shire M.D.   On: 11/23/2014 13:41   Nm Pulmonary Perf And Vent  11/23/2014   CLINICAL DATA:   Chest pain and shortness of breath.  EXAM: NUCLEAR MEDICINE VENTILATION - PERFUSION LUNG SCAN  TECHNIQUE: Ventilation images were obtained in multiple projections using inhaled aerosol technetium 99 M DTPA. Perfusion images were obtained in multiple projections after intravenous injection of Tc-14m MAA.  RADIOPHARMACEUTICALS:  39.3 mCi Tc-69m DTPA aerosol and 5.4 mCi Tc-45m MAA  COMPARISON:  Chest x-ray dated 11/22/2012 and CT angiogram of the chest dated 09/21/2011 and nuclear medicine lung scan dated 05/16/2010  FINDINGS: Ventilation: No focal ventilation defect.  Perfusion: No wedge shaped peripheral perfusion defects to suggest acute pulmonary embolism.  IMPRESSION: Normal ventilation-perfusion lung scan. No evidence suggestive of pulmonary embolism.   Electronically Signed   By: Lorriane Shire M.D.   On: 11/23/2014 17:35    Scheduled Meds: . [START ON 11/25/2014] Chlorhexidine Gluconate Cloth  6 each Topical Q0600  . cholecalciferol  1,000 Units Oral Daily  . colchicine  1.2 mg Oral Daily  . diclofenac sodium  2 g Topical QID  . enoxaparin (LOVENOX) injection  40 mg Subcutaneous Q24H  . feeding supplement (PRO-STAT SUGAR FREE 64)  30 mL Oral BID  . furosemide  40 mg Intravenous BID  . gabapentin  300 mg Oral TID  . isosorbide mononitrate  10 mg Oral BID  . metoprolol tartrate  75 mg Oral BID  . mupirocin ointment  1 application Nasal BID  . pantoprazole  40 mg Oral Daily  . polyethylene glycol  17 g Oral Daily  . saccharomyces boulardii  250 mg Oral QODAY  . senna  1 tablet Oral BID  . sodium chloride  3 mL Intravenous Q12H   Continuous Infusions:   Time spent: 35 minutes  [facility] [email or web address], password [facility] 11/24/2014, 2:18 PM  LOS: 1 day

## 2014-11-25 DIAGNOSIS — I10 Essential (primary) hypertension: Secondary | ICD-10-CM

## 2014-11-25 LAB — BASIC METABOLIC PANEL
Anion gap: 7 (ref 5–15)
BUN: 31 mg/dL — ABNORMAL HIGH (ref 6–23)
CHLORIDE: 103 mmol/L (ref 96–112)
CO2: 29 mmol/L (ref 19–32)
Calcium: 8.3 mg/dL — ABNORMAL LOW (ref 8.4–10.5)
Creatinine, Ser: 1.7 mg/dL — ABNORMAL HIGH (ref 0.50–1.10)
GFR calc non Af Amer: 30 mL/min — ABNORMAL LOW (ref >90)
GFR, EST AFRICAN AMERICAN: 35 mL/min — AB (ref >90)
Glucose, Bld: 120 mg/dL — ABNORMAL HIGH (ref 70–99)
POTASSIUM: 4.3 mmol/L (ref 3.5–5.1)
Sodium: 139 mmol/L (ref 135–145)

## 2014-11-25 LAB — GLUCOSE, CAPILLARY: Glucose-Capillary: 141 mg/dL — ABNORMAL HIGH (ref 70–99)

## 2014-11-25 MED ORDER — OXYCODONE HCL 5 MG PO TABS
10.0000 mg | ORAL_TABLET | Freq: Once | ORAL | Status: AC
  Administered 2014-11-25: 10 mg via ORAL
  Filled 2014-11-25: qty 2

## 2014-11-25 MED ORDER — AMLODIPINE BESYLATE 5 MG PO TABS
5.0000 mg | ORAL_TABLET | Freq: Every day | ORAL | Status: DC
  Administered 2014-11-25 – 2014-11-26 (×2): 5 mg via ORAL
  Filled 2014-11-25 (×2): qty 1

## 2014-11-25 MED ORDER — FUROSEMIDE 40 MG PO TABS
40.0000 mg | ORAL_TABLET | Freq: Two times a day (BID) | ORAL | Status: DC
  Administered 2014-11-25 – 2014-11-26 (×3): 40 mg via ORAL
  Filled 2014-11-25 (×3): qty 1

## 2014-11-25 MED ORDER — HYDRALAZINE HCL 20 MG/ML IJ SOLN
10.0000 mg | Freq: Four times a day (QID) | INTRAMUSCULAR | Status: DC | PRN
  Administered 2014-11-25: 10 mg via INTRAVENOUS
  Filled 2014-11-25: qty 1

## 2014-11-25 MED ORDER — HYDRALAZINE HCL 20 MG/ML IJ SOLN
10.0000 mg | Freq: Once | INTRAMUSCULAR | Status: AC
Start: 2014-11-25 — End: 2014-11-25
  Administered 2014-11-25: 10 mg via INTRAVENOUS
  Filled 2014-11-25: qty 1

## 2014-11-25 MED ORDER — AMLODIPINE BESYLATE 5 MG PO TABS
5.0000 mg | ORAL_TABLET | Freq: Once | ORAL | Status: AC
  Administered 2014-11-25: 5 mg via ORAL
  Filled 2014-11-25: qty 1

## 2014-11-25 NOTE — Progress Notes (Signed)
MD made aware of high blood pressures throughout the evening.  PRN BP medications given.  Pt current blood pressure 158/59.  Will continue to monitor closely.

## 2014-11-25 NOTE — Progress Notes (Signed)
[facility] PROGRESS NOTE  YARETSI HUMPHRES ZOX:[id number] DOB: 19-Mar-1946 DOA: 11/23/2014 PCP: Alvester Chou, NP  Assessment/Plan: 69 y/o female with PMH of HTN, CKD stage 3, Chronic diastolic CHF, DVT has IVC filter who presented to [facility] ED with worsening shortness of breath, DOE, and mild chest pain  - BNP was mildly elevated at 194. The troponin was WNL and the 12 lead EKG showed sinus rhythm. She was started on IV lasix and admitted for further evaluation and management of volume overload, pulmonary vascular congestion  1. Chest pain, likely musculoskeletal; trop negative;  V/Q scan low probability for pulmonary embolism; echo, LVEF 55-60%;  -mild reproducible chest tenderness on exam;  will provide analgesia PRN; chest pain is resolving  2. Essential hypertension uncontrolled; Continue imdur, metoprolol and lasix; added amlodipine on 3/31 3. Chronic diastolic heart failure / Pulmonary vascular congestion on xray; EF 55-60% -clinically improved, lung clear on exam, will resume home regimen, d/c IV lasix  4. Chronic kidney disease, stage 3;  Probable baseline creatinine; will monitor  5. DVT (deep venous thrombosis); Has IVC filter, does not take AC because in past was not able to afford [facility] so IVC filter placed instead 6. Lymphedema, right hip ulcer; Wound care consulted, appreciate their assessment  D/c plans pend PT eval;   Code Status: full Family Communication: d/w patient (indicate person spoken with, relationship, and if by phone, the number) Disposition Plan: pend PT   Consultants:  none  Procedures:  None   Antibiotics:  noen (indicate start date, and stop date if known)  HPI/Subjective: alert  Objective: Filed Vitals:   11/25/14 0900  BP: 170/63  Pulse: 67  Temp:   Resp:     Intake/Output Summary (Last 24 hours) at 11/25/14 1203 Last data filed at 11/25/14 1115  Gross per 24 hour  Intake    360 ml  Output   1850 ml  Net  -1490 ml   Filed Weights    11/23/14 1908 11/24/14 0552  Weight: 152.8 kg (336 lb 13.8 oz) 145.106 kg (319 lb 14.4 oz)    Exam:   General:  Alert, no distress   Cardiovascular: s1,s2 rrr  Respiratory: CTA BL  Abdomen: soft, nt, nd   Musculoskeletal: chronic edema   Data Reviewed: Basic Metabolic Panel:  Recent Labs Lab 11/23/14 1306 11/23/14 2215 11/24/14 0444 11/25/14 0345  NA 140 141 140 139  K 4.2 4.1 4.1 4.3  CL 104 104 106 103  CO2 25 28 27 29   GLUCOSE 157* 146* 121* 120*  BUN 30* 28* 28* 31*  CREATININE 1.69* 1.55* 1.56* 1.70*  CALCIUM 8.7 8.6 8.1* 8.3*  MG  --  1.9  --   --   PHOS  --  3.9  --   --    Liver Function Tests:  Recent Labs Lab 11/23/14 2215 11/24/14 0444  AST 19 16  ALT 11 10  ALKPHOS 105 91  BILITOT 0.6 0.3  PROT 7.7 7.2  ALBUMIN 2.7* 2.4*   No results for input(s): LIPASE, AMYLASE in the last 168 hours. No results for input(s): AMMONIA in the last 168 hours. CBC:  Recent Labs Lab 11/23/14 1053 11/23/14 1306 11/23/14 2055 11/24/14 0444  WBC  --  5.0 4.3 3.4*  NEUTROABS  --   --  2.3  --   HGB 11.3* 11.2* 10.1* 9.5*  HCT  --  36.6 32.1* 31.5*  MCV  --  90.6 90.4 90.5  PLT  --  237 291 224  Cardiac Enzymes:  Recent Labs Lab 11/23/14 1755 11/23/14 2215 11/24/14 0444  TROPONINI 0.03 0.03 0.03   BNP (last 3 results)  Recent Labs  09/03/14 0407 11/23/14 1306  BNP 507.4* 194.0*    ProBNP (last 3 results)  Recent Labs  07/11/14 1959  PROBNP 2482.0*    CBG:  Recent Labs Lab 11/24/14 0740 11/25/14 0917  GLUCAP 101* 141*    Recent Results (from the past 240 hour(s))  MRSA PCR Screening     Status: Abnormal   Collection Time: 11/23/14 11:47 AM  Result Value Ref Range Status   MRSA by PCR POSITIVE (A) NEGATIVE Final    Comment:        The GeneXpert MRSA Assay (FDA approved for NASAL specimens only), is one component of a comprehensive MRSA colonization surveillance program. It is not intended to diagnose MRSA infection  nor to guide or monitor treatment for MRSA infections. RESULT CALLED TO, READ BACK BY AND VERIFIED WITH: T.NICHOLS,RN AT 0055 ON 11/24/14 BY W.SHEA      Studies: Dg Chest 2 View  11/23/2014   CLINICAL DATA:  Chest pain.  Shortness of breath.  EXAM: CHEST  2 VIEW  COMPARISON:  07/11/2014 and 03/03/2014  FINDINGS: There is cardiomegaly with increased pulmonary vascular congestion. No infiltrates or effusions.No acute osseous abnormality.  IMPRESSION: Cardiomegaly. Increased pulmonary vascular congestion since the prior exam appear   Electronically Signed   By: Lorriane Shire M.D.   On: 11/23/2014 13:41   Nm Pulmonary Perf And Vent  11/23/2014   CLINICAL DATA:  Chest pain and shortness of breath.  EXAM: NUCLEAR MEDICINE VENTILATION - PERFUSION LUNG SCAN  TECHNIQUE: Ventilation images were obtained in multiple projections using inhaled aerosol technetium 99 M DTPA. Perfusion images were obtained in multiple projections after intravenous injection of Tc-53m MAA.  RADIOPHARMACEUTICALS:  39.3 mCi Tc-59m DTPA aerosol and 5.4 mCi Tc-71m MAA  COMPARISON:  Chest x-ray dated 11/22/2012 and CT angiogram of the chest dated 09/21/2011 and nuclear medicine lung scan dated 05/16/2010  FINDINGS: Ventilation: No focal ventilation defect.  Perfusion: No wedge shaped peripheral perfusion defects to suggest acute pulmonary embolism.  IMPRESSION: Normal ventilation-perfusion lung scan. No evidence suggestive of pulmonary embolism.   Electronically Signed   By: Lorriane Shire M.D.   On: 11/23/2014 17:35    Scheduled Meds: . Chlorhexidine Gluconate Cloth  6 each Topical Q0600  . cholecalciferol  1,000 Units Oral Daily  . colchicine  1.2 mg Oral Daily  . diclofenac sodium  2 g Topical QID  . enoxaparin (LOVENOX) injection  40 mg Subcutaneous Q24H  . feeding supplement (PRO-STAT SUGAR FREE 64)  30 mL Oral BID  . furosemide  40 mg Oral BID  . gabapentin  300 mg Oral TID  . isosorbide mononitrate  10 mg Oral BID  .  metoprolol tartrate  75 mg Oral BID  . mupirocin ointment  1 application Nasal BID  . pantoprazole  40 mg Oral Daily  . polyethylene glycol  17 g Oral Daily  . saccharomyces boulardii  250 mg Oral QODAY  . senna  1 tablet Oral BID  . sodium chloride  3 mL Intravenous Q12H   Continuous Infusions:   Principal Problem:   Chest pain Active Problems:   Essential hypertension   Morbid obesity   Chronic diastolic heart failure   Chronic kidney disease, stage 3   Anemia of chronic renal failure, stage 3 (moderate)   DVT (deep venous thrombosis)   Volume overload  Pulmonary vascular congestion   Angina pectoris    Time spent: >35 minutes     Kinnie Feil  [facility] Pager [phone]. If 7PM-7AM, please contact night-coverage at [email or web address], password [employer] [facility] 11/25/2014, 12:03 PM  LOS: 2 days

## 2014-11-25 NOTE — Evaluation (Signed)
Physical Therapy Evaluation Patient Details Name: Kelly [name] MRN: [id number] DOB: July 21, 1946 Today's Date: 11/25/2014   History of Present Illness  69 year old female with past medical history of morbid obesity, hypertension, CKD stage 3, chronic diastolic CHF, DVT has IVC filter who presented to [facility] ED with worsening shortness of breath especially when she lays down flat.  Pt with chest pain, believed to be musculoskeletal, MI ruled out  Clinical Impression  Pt admitted with above diagnosis. Pt currently with functional limitations due to the deficits listed below (see PT Problem List).  Pt will benefit from skilled PT to increase their independence and safety with mobility to allow discharge to the venue listed below.  Pt reports she plans to d/c home, active with HHPT prior to admission.     Follow Up Recommendations Home health PT    Equipment Recommendations  None recommended by PT    Recommendations for Other Services       Precautions / Restrictions Precautions Precautions: None      Mobility  Bed Mobility Overal bed mobility: Modified Independent             General bed mobility comments: increased time and effort  Transfers Overall transfer level: Needs assistance Equipment used: Rolling walker (2 wheeled) Transfers: Sit to/from Stand Sit to Stand: Min guard         General transfer comment: min/guard for safety, pt reports no OOB mobility since admission  Ambulation/Gait Ambulation/Gait assistance: Min guard Ambulation Distance (Feet): 80 Feet Assistive device: Rolling walker (2 wheeled) Gait Pattern/deviations: Step-through pattern;Decreased stride length;Trunk flexed     General Gait Details: slow yet steady gait, cues for improving trunk flexion/posture  Stairs            Wheelchair Mobility    Modified Rankin (Stroke Patients Only)       Balance                                             Pertinent  Vitals/Pain Pain Assessment: No/denies pain (student RN applied topical gel to chest, no c/o pain with activity)    Home Living Family/patient expects to be discharged to:: Private residence Living Arrangements: Alone Available Help at Discharge: Family;Available PRN/intermittently Type of Home: Apartment Home Access: Level entry     Home Layout: One level Home Equipment: Hospital bed;Walker - 2 wheels;Shower seat - built in;Bedside commode;Adaptive equipment;Wheelchair - manual      Prior Function Level of Independence: Independent with assistive device(s)               Hand Dominance        Extremity/Trunk Assessment               Lower Extremity Assessment: RLE deficits/detail;LLE deficits/detail RLE Deficits / Details: weeping LEs LLE Deficits / Details: hx lymphedema     Communication   Communication: No difficulties  Cognition Arousal/Alertness: Awake/alert Behavior During Therapy: WFL for tasks assessed/performed Overall Cognitive Status: Within Functional Limits for tasks assessed                      General Comments      Exercises        Assessment/Plan    PT Assessment Patient needs continued PT services  PT Diagnosis Difficulty walking   PT Problem List Decreased strength;Decreased activity tolerance;Decreased mobility;Obesity;Decreased skin  integrity  PT Treatment Interventions DME instruction;Gait training;Functional mobility training;Patient/family education;Therapeutic activities;Therapeutic exercise   PT Goals (Current goals can be found in the Care Plan section) Acute Rehab PT Goals PT Goal Formulation: With patient Time For Goal Achievement: 12/02/14 Potential to Achieve Goals: Good    Frequency Min 3X/week   Barriers to discharge        Co-evaluation               End of Session Equipment Utilized During Treatment: Gait belt Activity Tolerance: Patient tolerated treatment well Patient left: in chair;with  call bell/phone within reach Nurse Communication: Mobility status         Time: 1470-9295 PT Time Calculation (min) (ACUTE ONLY): 23 min   Charges:   PT Evaluation $Initial PT Evaluation Tier I: 1 Procedure     PT G Codes:        Kelly [name],Kelly [name] 11/25/2014, 12:10 PM Carmelia Bake, PT, DPT 11/25/2014 Pager: [phone]

## 2014-11-26 DIAGNOSIS — Z22322 Carrier or suspected carrier of Methicillin resistant Staphylococcus aureus: Secondary | ICD-10-CM

## 2014-11-26 DIAGNOSIS — I890 Lymphedema, not elsewhere classified: Secondary | ICD-10-CM

## 2014-11-26 DIAGNOSIS — E8770 Fluid overload, unspecified: Secondary | ICD-10-CM

## 2014-11-26 LAB — BASIC METABOLIC PANEL
ANION GAP: 8 (ref 5–15)
BUN: 31 mg/dL — AB (ref 6–23)
CALCIUM: 8.6 mg/dL (ref 8.4–10.5)
CO2: 28 mmol/L (ref 19–32)
Chloride: 103 mmol/L (ref 96–112)
Creatinine, Ser: 1.55 mg/dL — ABNORMAL HIGH (ref 0.50–1.10)
GFR calc Af Amer: 39 mL/min — ABNORMAL LOW (ref >90)
GFR, EST NON AFRICAN AMERICAN: 34 mL/min — AB (ref >90)
Glucose, Bld: 112 mg/dL — ABNORMAL HIGH (ref 70–99)
Potassium: 4.4 mmol/L (ref 3.5–5.1)
SODIUM: 139 mmol/L (ref 135–145)

## 2014-11-26 LAB — GLUCOSE, CAPILLARY: Glucose-Capillary: 99 mg/dL (ref 70–99)

## 2014-11-26 MED ORDER — DICLOFENAC SODIUM 1 % TD GEL: 2.0000 g | Freq: Four times a day (QID) | TRANSDERMAL | Status: DC

## 2014-11-26 MED ORDER — AMLODIPINE BESYLATE 10 MG PO TABS: 10.0000 mg | ORAL_TABLET | Freq: Every day | ORAL | Status: AC

## 2014-11-26 NOTE — Discharge Summary (Signed)
Physician Discharge Summary  LANDRY LOOKINGBILL RDE:[id number] DOB: 03-04-1946 DOA: 11/23/2014  PCP: Alvester Chou, NP  Admit date: 11/23/2014 Discharge date: 11/26/2014   Recommendations for Outpatient Follow-Up:   1. Home health RN, PT set up prior to D/C.    Discharge Diagnosis:   Principal Problem:    Chest pain Active Problems:    Essential hypertension    Morbid obesity    Chronic diastolic heart failure    Chronic kidney disease, stage 3    Anemia of chronic renal failure, stage 3 (moderate)    Open thigh wound    DVT (deep venous thrombosis)    Volume overload    Pulmonary vascular congestion    MRSA carrier    Lymphedema   Discharge Condition: Improved.  Diet recommendation: Low sodium, heart healthy.     History of Present Illness:   Kelly [name] is an 69 y.o. female with a PMH of HTN, CKD stage 3, chronic diastolic CHF, DVT s/p IVC filter who was admitted 11/23/14 with shortness of breath, DOE, and mild chest pain. BNP was mildly elevated at 194. The troponin was WNL and 12 lead EKG showed sinus rhythm. She was started on IV lasix and admitted for further evaluation and management of volume overload, pulmonary vascular congestion.   Hospital Course by Problem:   Principal Problem:  Chest pain - Thought to be musculoskeletal (chest pain reproducible), acute MI ruled out with 3 sets of negative troponins. - Chest x-ray showed increased pulmonary vascularization and BNP was mildly elevated suggesting pulmonary edema. - Pulmonary embolism ruled out with VQ scan. - 2-D echo done 11/24/14: EF 55-60 percent with grade 1 diastolic dysfunction, right-sided heart failure with mild pulmonary hypertension and a dilated IVC.  Active Problems:  MRSA carrier - Given decontamination therapy and maintained on contact isolation.   Essential hypertension - Continue imdur, metoprolol, Lasix and amlodipine.    Morbid obesity - Evaluated by dietitian  11/24/14. Given ProStat per recommendations.   Acute on chronic diastolic heart failure / pulmonary vascular congestion / volume overload - Patient was diuresed.   Chronic kidney disease, stage 3 - Baseline creatinine appears to be 1.3-1.7. Current creatinine consistent with usual baseline values.   Anemia of chronic renal failure, stage 3 (moderate) - Ferritin high. Hemoglobin slowly trending down over time. No current indication for transfusion.   DVT (deep venous thrombosis) - Patient has not IVC filter. - Has elected not to be treated with anticoagulation secondary to cost constraints.   Lymphedema/right hip ulcer - Stage III chronic, nonhealing full-thickness wound on right posterior thigh evaluated by wound care nurse 11/24/14. Continue wound care per recommendations.    Medical Consultants:    None.   Discharge Exam:   Filed Vitals:   11/26/14 1002  BP: 145/49  Pulse: 66  Temp:   Resp: 20   Filed Vitals:   11/25/14 2120 11/26/14 0433 11/26/14 0859 11/26/14 1002  BP: 165/57 176/59 144/53 145/49  Pulse: 70 60 59 66  Temp: 98.3 F (36.8 C) 98.1 F (36.7 C) 99.1 F (37.3 C)   TempSrc: Oral Oral Oral Oral  Resp: 18 18 20 20   Height:      Weight:  137.4 kg (302 lb 14.6 oz)    SpO2: 99% 97% 100% 100%    Gen:  NAD Cardiovascular:  RRR, No M/R/G Respiratory: Lungs CTAB Gastrointestinal: Abdomen soft, NT/ND with normal active bowel sounds. Extremities: 2-3 + edema   The results of significant diagnostics  from this hospitalization (including imaging, microbiology, ancillary and laboratory) are listed below for reference.     Procedures and Diagnostic Studies:   Dg Chest 2 View 11/23/2014: Cardiomegaly. Increased pulmonary vascular congestion since the prior exam appear   Nm Pulmonary Perf And Vent 11/23/2014: Normal ventilation-perfusion lung scan. No evidence suggestive of pulmonary embolism.   2-D echo 11/24/14: Normal LV size with EF 55-60%.  D-shaped interventricular septumsuggestive of RV pressure/volume overload. Mild to moderate RV dilation with normal systolic function. Mild pulmonary hypertension. Dilated IVC suggestive of elevated RV filling pressure.   Labs:   Basic Metabolic Panel:  Recent Labs Lab 11/23/14 1306 11/23/14 2215 11/24/14 0444 11/25/14 0345 11/26/14 0510  NA 140 141 140 139 139  K 4.2 4.1 4.1 4.3 4.4  CL 104 104 106 103 103  CO2 25 28 27 29 28   GLUCOSE 157* 146* 121* 120* 112*  BUN 30* 28* 28* 31* 31*  CREATININE 1.69* 1.55* 1.56* 1.70* 1.55*  CALCIUM 8.7 8.6 8.1* 8.3* 8.6  MG  --  1.9  --   --   --   PHOS  --  3.9  --   --   --    GFR Estimated Creatinine Clearance: 50.3 mL/min (by C-G formula based on Cr of 1.55). Liver Function Tests:  Recent Labs Lab 11/23/14 2215 11/24/14 0444  AST 19 16  ALT 11 10  ALKPHOS 105 91  BILITOT 0.6 0.3  PROT 7.7 7.2  ALBUMIN 2.7* 2.4*   Coagulation profile  Recent Labs Lab 11/23/14 2055  INR 1.11    CBC:  Recent Labs Lab 11/23/14 1053 11/23/14 1306 11/23/14 2055 11/24/14 0444  WBC  --  5.0 4.3 3.4*  NEUTROABS  --   --  2.3  --   HGB 11.3* 11.2* 10.1* 9.5*  HCT  --  36.6 32.1* 31.5*  MCV  --  90.6 90.4 90.5  PLT  --  237 291 224   Cardiac Enzymes:  Recent Labs Lab 11/23/14 1755 11/23/14 2215 11/24/14 0444  TROPONINI 0.03 0.03 0.03   BNP: Invalid input(s): POCBNP CBG:  Recent Labs Lab 11/24/14 0740 11/25/14 0917 11/26/14 0809  GLUCAP 101* 141* 99   Thyroid function studies  Recent Labs  11/23/14 2055  TSH 0.793   Microbiology Recent Results (from the past 240 hour(s))  MRSA PCR Screening     Status: Abnormal   Collection Time: 11/23/14 11:47 AM  Result Value Ref Range Status   MRSA by PCR POSITIVE (A) NEGATIVE Final    Comment:        The GeneXpert MRSA Assay (FDA approved for NASAL specimens only), is one component of a comprehensive MRSA colonization surveillance program. It is not intended to  diagnose MRSA infection nor to guide or monitor treatment for MRSA infections. RESULT CALLED TO, READ BACK BY AND VERIFIED WITH: T.NICHOLS,RN AT 0055 ON 11/24/14 BY W.SHEA      Discharge Instructions:       Discharge Instructions    (HEART FAILURE PATIENTS) Call MD:  Anytime you have any of the following symptoms: 1) 3 pound weight gain in 24 hours or 5 pounds in 1 week 2) shortness of breath, with or without a dry hacking cough 3) swelling in the hands, feet or stomach 4) if you have to sleep on extra pillows at night in order to breathe.    Complete by:  As directed      Call MD for:  difficulty breathing, headache or visual disturbances  Complete by:  As directed      Call MD for:  extreme fatigue    Complete by:  As directed      Call MD for:  persistant nausea and vomiting    Complete by:  As directed      Diet - low sodium heart healthy    Complete by:  As directed      Discharge instructions    Complete by:  As directed   You were treated for heart failure in the hospital.  To prevent exacerbations of your heart failure, it is important that you check your weight at the same time every day, and that if you gain over 3 pounds, OR you develop worsening swelling to the legs, experience more shortness of breath or chest pain, you call your Primary MD immediately.   Follow a heart healthy, low salt diet and restrict your fluid intake to 1.5 liters a day or less.     Face-to-face encounter (required for Medicare/Medicaid patients)    Complete by:  As directed   I Jaci Desanto certify that this patient is under my care and that I, or a nurse practitioner or physician's assistant working with me, had a face-to-face encounter that meets the physician face-to-face encounter requirements with this patient on 11/26/2014. The encounter with the patient was in whole, or in part for the following medical condition(s) which is the primary reason for home health care (List medical condition):  RN to provide wound care assessment, teaching of CHF/importance of daily weight, taking lasix as prescribed. Silver dressing to right thigh wound.  The encounter with the patient was in whole, or in part, for the following medical condition, which is the primary reason for home health care:  Chest pain, acute on chronic diastolic CHF  I certify that, based on my findings, the following services are medically necessary home health services:   Nursing Physical therapy    Reason for Medically Necessary Home Health Services:   Skilled Nursing- Teaching of Disease Process/Symptom Management Skilled Nursing- Assessment and Observation of Wound Status Therapy- Therapeutic Exercises to Increase Strength and Endurance    My clinical findings support the need for the above services:  Unable to leave home safely without assistance and/or assistive device  Further, I certify that my clinical findings support that this patient is homebound due to:  Unable to leave home safely without assistance     Home Health    Complete by:  As directed   To provide the following care/treatments:   PT RN       Increase activity slowly    Complete by:  As directed             Medication List    STOP taking these medications        levofloxacin 750 MG tablet  Commonly known as:  LEVAQUIN      TAKE these medications        amLODipine 10 MG tablet  Commonly known as:  NORVASC  Take 1 tablet (10 mg total) by mouth daily.     cholecalciferol 1000 UNITS tablet  Commonly known as:  VITAMIN D  Take 1,000 Units by mouth daily.     colchicine 0.6 MG tablet  Take 2 tablets by mouth daily.     diclofenac sodium 1 % Gel  Commonly known as:  VOLTAREN  Apply 2 g topically 4 (four) times daily.     esomeprazole 40 MG capsule  Commonly known as:  NEXIUM  Take 40 mg by mouth daily before breakfast. Take on an empty stomach     furosemide 40 MG tablet  Commonly known as:  LASIX  Take 40 mg by mouth 2 (two)  times daily.     gabapentin 300 MG capsule  Commonly known as:  NEURONTIN  Take 300 mg by mouth 3 (three) times daily.     hydrOXYzine 25 MG tablet  Commonly known as:  ATARAX/VISTARIL  Take 1 tablet by mouth 3 (three) times daily as needed for itching.     isosorbide mononitrate 10 MG tablet  Commonly known as:  ISMO,MONOKET  Take 10 mg by mouth 2 (two) times daily.     JUVEN NUTRIVIGOR Pack  Take 1 Container by mouth every other day.     metoprolol tartrate 25 MG tablet  Commonly known as:  LOPRESSOR  Take 3 tablets (75 mg total) by mouth 2 (two) times daily.     Oxycodone HCl 10 MG Tabs  Take 1 tablet (10 mg total) by mouth every 4 (four) hours as needed (moderate pain).     polyethylene glycol packet  Commonly known as:  MIRALAX / GLYCOLAX  Take 17 g by mouth daily. Mix with 4-6 oz liquid     promethazine 25 MG tablet  Commonly known as:  PHENERGAN  Take 25 mg by mouth every 6 (six) hours as needed for nausea or vomiting.     saccharomyces boulardii 250 MG capsule  Commonly known as:  FLORASTOR  Take 1 capsule (250 mg total) by mouth 2 (two) times daily.     senna 8.6 MG Tabs tablet  Commonly known as:  SENOKOT  Take 1 tablet (8.6 mg total) by mouth 2 (two) times daily.     zolpidem 5 MG tablet  Commonly known as:  AMBIEN  Take 1 tablet (5 mg total) by mouth at bedtime as needed for sleep.       Follow-up Information    Follow up with Alvester Chou, NP.   Specialty:  Nurse Practitioner   Why:  At your scheduled appt time   Contact information:   [facility] [place] [place] [address] [phone]        Time coordinating discharge: 35 minutes.  Signed:  Pablo Stauffer  Pager [phone] [facility] 11/26/2014, 1:06 PM          ]

## 2014-11-26 NOTE — Discharge Instructions (Signed)

## 2014-11-30 ENCOUNTER — Encounter (HOSPITAL_COMMUNITY)
Admission: RE | Admit: 2014-11-30 | Discharge: 2014-11-30 | Disposition: A | Discharge status: Home / Self Care | Payer: Medicare Other | Source: Ambulatory Visit | Attending: Nephrology | Admitting: Nephrology

## 2014-11-30 DIAGNOSIS — D509 Iron deficiency anemia, unspecified: Secondary | ICD-10-CM | POA: Insufficient documentation

## 2014-11-30 DIAGNOSIS — Z5181 Encounter for therapeutic drug level monitoring: Secondary | ICD-10-CM | POA: Diagnosis not present

## 2014-11-30 DIAGNOSIS — N183 Chronic kidney disease, stage 3 (moderate): Secondary | ICD-10-CM | POA: Insufficient documentation

## 2014-11-30 LAB — POCT HEMOGLOBIN-HEMACUE: HEMOGLOBIN: 10.5 g/dL — AB (ref 12.0–15.0)

## 2014-11-30 MED ORDER — EPOETIN ALFA 10000 UNIT/ML IJ SOLN
INTRAMUSCULAR | Status: AC
  Filled 2014-11-30: qty 1

## 2014-11-30 MED ORDER — EPOETIN ALFA 10000 UNIT/ML IJ SOLN
10000.0000 [IU] | INTRAMUSCULAR | Status: DC
  Administered 2014-11-30: 10000 [IU] via SUBCUTANEOUS

## 2014-12-07 ENCOUNTER — Ambulatory Visit: Payer: Medicare Other | Admitting: Internal Medicine

## 2014-12-09 ENCOUNTER — Encounter (HOSPITAL_COMMUNITY)
Admission: RE | Admit: 2014-12-09 | Discharge: 2014-12-09 | Disposition: A | Discharge status: Home / Self Care | Payer: Medicare Other | Source: Ambulatory Visit | Attending: Nephrology | Admitting: Nephrology

## 2014-12-09 DIAGNOSIS — D509 Iron deficiency anemia, unspecified: Secondary | ICD-10-CM | POA: Diagnosis not present

## 2014-12-09 LAB — POCT HEMOGLOBIN-HEMACUE: Hemoglobin: 11.3 g/dL — ABNORMAL LOW (ref 12.0–15.0)

## 2014-12-09 MED ORDER — EPOETIN ALFA 10000 UNIT/ML IJ SOLN
10000.0000 [IU] | INTRAMUSCULAR | Status: DC
  Administered 2014-12-09: 10000 [IU] via SUBCUTANEOUS

## 2014-12-09 MED ORDER — EPOETIN ALFA 10000 UNIT/ML IJ SOLN
INTRAMUSCULAR | Status: AC
  Filled 2014-12-09: qty 1

## 2014-12-14 ENCOUNTER — Encounter (HOSPITAL_COMMUNITY)
Admission: RE | Admit: 2014-12-14 | Discharge: 2014-12-14 | Disposition: A | Discharge status: Home / Self Care | Payer: Medicare Other | Source: Ambulatory Visit | Attending: Nephrology | Admitting: Nephrology

## 2014-12-14 LAB — POCT HEMOGLOBIN-HEMACUE: Hemoglobin: 10.8 g/dL — ABNORMAL LOW (ref 12.0–15.0)

## 2014-12-14 MED ORDER — EPOETIN ALFA 10000 UNIT/ML IJ SOLN
10000.0000 [IU] | INTRAMUSCULAR | Status: DC
  Administered 2014-12-14: 10000 [IU] via SUBCUTANEOUS

## 2014-12-14 MED ORDER — EPOETIN ALFA 10000 UNIT/ML IJ SOLN
INTRAMUSCULAR | Status: AC
  Filled 2014-12-14: qty 1

## 2014-12-15 ENCOUNTER — Encounter (HOSPITAL_COMMUNITY): Payer: Self-pay | Admitting: Emergency Medicine

## 2014-12-15 ENCOUNTER — Emergency Department (HOSPITAL_COMMUNITY): Payer: Medicare Other

## 2014-12-15 ENCOUNTER — Inpatient Hospital Stay (HOSPITAL_COMMUNITY)
Admission: EM | Admit: 2014-12-15 | Discharge: 2014-12-23 | DRG: 853 | Disposition: A | Discharge status: Skilled Nursing Facility | Payer: Medicare Other | Attending: Internal Medicine | Admitting: Internal Medicine

## 2014-12-15 DIAGNOSIS — I2510 Atherosclerotic heart disease of native coronary artery without angina pectoris: Secondary | ICD-10-CM | POA: Diagnosis present

## 2014-12-15 DIAGNOSIS — Z86718 Personal history of other venous thrombosis and embolism: Secondary | ICD-10-CM | POA: Diagnosis not present

## 2014-12-15 DIAGNOSIS — I5032 Chronic diastolic (congestive) heart failure: Secondary | ICD-10-CM | POA: Diagnosis present

## 2014-12-15 DIAGNOSIS — E11649 Type 2 diabetes mellitus with hypoglycemia without coma: Secondary | ICD-10-CM | POA: Diagnosis present

## 2014-12-15 DIAGNOSIS — L89893 Pressure ulcer of other site, stage 3: Secondary | ICD-10-CM | POA: Diagnosis present

## 2014-12-15 DIAGNOSIS — E785 Hyperlipidemia, unspecified: Secondary | ICD-10-CM | POA: Diagnosis present

## 2014-12-15 DIAGNOSIS — Z6841 Body Mass Index (BMI) 40.0 and over, adult: Secondary | ICD-10-CM | POA: Diagnosis not present

## 2014-12-15 DIAGNOSIS — I872 Venous insufficiency (chronic) (peripheral): Secondary | ICD-10-CM | POA: Diagnosis present

## 2014-12-15 DIAGNOSIS — E1140 Type 2 diabetes mellitus with diabetic neuropathy, unspecified: Secondary | ICD-10-CM | POA: Diagnosis present

## 2014-12-15 DIAGNOSIS — L03116 Cellulitis of left lower limb: Secondary | ICD-10-CM

## 2014-12-15 DIAGNOSIS — G4730 Sleep apnea, unspecified: Secondary | ICD-10-CM | POA: Diagnosis present

## 2014-12-15 DIAGNOSIS — N179 Acute kidney failure, unspecified: Secondary | ICD-10-CM | POA: Diagnosis present

## 2014-12-15 DIAGNOSIS — K219 Gastro-esophageal reflux disease without esophagitis: Secondary | ICD-10-CM | POA: Diagnosis present

## 2014-12-15 DIAGNOSIS — M109 Gout, unspecified: Secondary | ICD-10-CM | POA: Diagnosis present

## 2014-12-15 DIAGNOSIS — I129 Hypertensive chronic kidney disease with stage 1 through stage 4 chronic kidney disease, or unspecified chronic kidney disease: Secondary | ICD-10-CM | POA: Diagnosis present

## 2014-12-15 DIAGNOSIS — A419 Sepsis, unspecified organism: Principal | ICD-10-CM | POA: Diagnosis present

## 2014-12-15 DIAGNOSIS — N183 Chronic kidney disease, stage 3 unspecified: Secondary | ICD-10-CM | POA: Diagnosis present

## 2014-12-15 DIAGNOSIS — M009 Pyogenic arthritis, unspecified: Secondary | ICD-10-CM | POA: Diagnosis present

## 2014-12-15 DIAGNOSIS — M23204 Derangement of unspecified medial meniscus due to old tear or injury, left knee: Secondary | ICD-10-CM | POA: Diagnosis present

## 2014-12-15 DIAGNOSIS — M65862 Other synovitis and tenosynovitis, left lower leg: Secondary | ICD-10-CM | POA: Diagnosis present

## 2014-12-15 DIAGNOSIS — E162 Hypoglycemia, unspecified: Secondary | ICD-10-CM | POA: Diagnosis not present

## 2014-12-15 DIAGNOSIS — E1122 Type 2 diabetes mellitus with diabetic chronic kidney disease: Secondary | ICD-10-CM | POA: Diagnosis not present

## 2014-12-15 DIAGNOSIS — G629 Polyneuropathy, unspecified: Secondary | ICD-10-CM | POA: Diagnosis present

## 2014-12-15 DIAGNOSIS — Z86711 Personal history of pulmonary embolism: Secondary | ICD-10-CM

## 2014-12-15 DIAGNOSIS — Z87891 Personal history of nicotine dependence: Secondary | ICD-10-CM

## 2014-12-15 DIAGNOSIS — L0390 Cellulitis, unspecified: Secondary | ICD-10-CM | POA: Diagnosis present

## 2014-12-15 DIAGNOSIS — M94262 Chondromalacia, left knee: Secondary | ICD-10-CM | POA: Diagnosis present

## 2014-12-15 DIAGNOSIS — Z452 Encounter for adjustment and management of vascular access device: Secondary | ICD-10-CM

## 2014-12-15 DIAGNOSIS — E1121 Type 2 diabetes mellitus with diabetic nephropathy: Secondary | ICD-10-CM | POA: Diagnosis present

## 2014-12-15 LAB — CBC WITH DIFFERENTIAL/PLATELET
BASOS ABS: 0 10*3/uL (ref 0.0–0.1)
Basophils Relative: 0 % (ref 0–1)
Eosinophils Absolute: 0 10*3/uL (ref 0.0–0.7)
Eosinophils Relative: 0 % (ref 0–5)
HEMATOCRIT: 38.1 % (ref 36.0–46.0)
Hemoglobin: 12.1 g/dL (ref 12.0–15.0)
LYMPHS PCT: 7 % — AB (ref 12–46)
Lymphs Abs: 1.1 10*3/uL (ref 0.7–4.0)
MCH: 28.2 pg (ref 26.0–34.0)
MCHC: 31.8 g/dL (ref 30.0–36.0)
MCV: 88.8 fL (ref 78.0–100.0)
MONO ABS: 0.8 10*3/uL (ref 0.1–1.0)
Monocytes Relative: 5 % (ref 3–12)
NEUTROS ABS: 13.3 10*3/uL — AB (ref 1.7–7.7)
Neutrophils Relative %: 88 % — ABNORMAL HIGH (ref 43–77)
PLATELETS: 231 10*3/uL (ref 150–400)
RBC: 4.29 MIL/uL (ref 3.87–5.11)
RDW: 15.4 % (ref 11.5–15.5)
WBC: 15.2 10*3/uL — ABNORMAL HIGH (ref 4.0–10.5)

## 2014-12-15 LAB — COMPREHENSIVE METABOLIC PANEL
ALT: 12 U/L (ref 0–35)
ANION GAP: 11 (ref 5–15)
AST: 18 U/L (ref 0–37)
Albumin: 2.9 g/dL — ABNORMAL LOW (ref 3.5–5.2)
Alkaline Phosphatase: 97 U/L (ref 39–117)
BILIRUBIN TOTAL: 0.4 mg/dL (ref 0.3–1.2)
BUN: 40 mg/dL — AB (ref 6–23)
CO2: 24 mmol/L (ref 19–32)
Calcium: 8.4 mg/dL (ref 8.4–10.5)
Chloride: 102 mmol/L (ref 96–112)
Creatinine, Ser: 2.18 mg/dL — ABNORMAL HIGH (ref 0.50–1.10)
GFR calc non Af Amer: 22 mL/min — ABNORMAL LOW (ref >90)
GFR, EST AFRICAN AMERICAN: 26 mL/min — AB (ref >90)
GLUCOSE: 214 mg/dL — AB (ref 70–99)
Potassium: 4.5 mmol/L (ref 3.5–5.1)
Sodium: 137 mmol/L (ref 135–145)
Total Protein: 8.7 g/dL — ABNORMAL HIGH (ref 6.0–8.3)

## 2014-12-15 LAB — URINALYSIS, ROUTINE W REFLEX MICROSCOPIC
BILIRUBIN URINE: NEGATIVE
Glucose, UA: NEGATIVE mg/dL
KETONES UR: NEGATIVE mg/dL
Nitrite: NEGATIVE
PROTEIN: 100 mg/dL — AB
SPECIFIC GRAVITY, URINE: 1.014 (ref 1.005–1.030)
UROBILINOGEN UA: 0.2 mg/dL (ref 0.0–1.0)
pH: 6.5 (ref 5.0–8.0)

## 2014-12-15 LAB — I-STAT CG4 LACTIC ACID, ED: Lactic Acid, Venous: 1.86 mmol/L (ref 0.5–2.0)

## 2014-12-15 LAB — URINE MICROSCOPIC-ADD ON

## 2014-12-15 MED ORDER — SODIUM CHLORIDE 0.9 % IV BOLUS (SEPSIS)
1000.0000 mL | Freq: Once | INTRAVENOUS | Status: AC
  Administered 2014-12-15: 1000 mL via INTRAVENOUS

## 2014-12-15 MED ORDER — VANCOMYCIN HCL IN DEXTROSE 1-5 GM/200ML-% IV SOLN: 1000.0000 mg | Freq: Once | INTRAVENOUS | Status: DC

## 2014-12-15 MED ORDER — METRONIDAZOLE IN NACL 5-0.79 MG/ML-% IV SOLN
500.0000 mg | Freq: Three times a day (TID) | INTRAVENOUS | Status: DC
  Administered 2014-12-16 – 2014-12-19 (×10): 500 mg via INTRAVENOUS
  Filled 2014-12-15 (×13): qty 100

## 2014-12-15 MED ORDER — ACETAMINOPHEN 325 MG PO TABS
650.0000 mg | ORAL_TABLET | Freq: Four times a day (QID) | ORAL | Status: DC | PRN
  Administered 2014-12-15: 650 mg via ORAL
  Filled 2014-12-15: qty 2

## 2014-12-15 MED ORDER — MORPHINE SULFATE 4 MG/ML IJ SOLN
4.0000 mg | Freq: Once | INTRAMUSCULAR | Status: AC
  Administered 2014-12-15: 4 mg via INTRAVENOUS
  Filled 2014-12-15: qty 1

## 2014-12-15 MED ORDER — METRONIDAZOLE IN NACL 5-0.79 MG/ML-% IV SOLN
500.0000 mg | Freq: Once | INTRAVENOUS | Status: AC
  Administered 2014-12-15: 500 mg via INTRAVENOUS
  Filled 2014-12-15: qty 100

## 2014-12-15 MED ORDER — SODIUM CHLORIDE 0.9 % IV SOLN: 1750.0000 mg | INTRAVENOUS | Status: DC

## 2014-12-15 MED ORDER — DEXTROSE 5 % IV SOLN
2.0000 g | Freq: Once | INTRAVENOUS | Status: AC
  Administered 2014-12-15: 2 g via INTRAVENOUS
  Filled 2014-12-15: qty 2

## 2014-12-15 MED ORDER — VANCOMYCIN HCL 10 G IV SOLR
2500.0000 mg | INTRAVENOUS | Status: AC
Start: 2014-12-15 — End: 2014-12-16
  Administered 2014-12-15: 2500 mg via INTRAVENOUS
  Filled 2014-12-15: qty 2500

## 2014-12-15 MED ORDER — DEXTROSE 5 % IV SOLN
1.0000 g | Freq: Three times a day (TID) | INTRAVENOUS | Status: DC
  Administered 2014-12-16 – 2014-12-19 (×10): 1 g via INTRAVENOUS
  Filled 2014-12-15 (×15): qty 1

## 2014-12-15 NOTE — H&P (Signed)
PCP:  Alvester Chou, NP  [facility]   Referring physician Dr. Regenia Skeeter  Chief Complaint: Leg pain and fever at home  HPI: Kelly [name] is a 69 y.o. female   has a past medical history of Hypertension; Hyperlipidemia; CHF (congestive heart failure); GERD (gastroesophageal reflux disease); Depression; Lymphedema of leg; Pulmonary embolism (02/18/14); Chronic indwelling Foley catheter; Heart murmur; DVT (deep venous thrombosis) (01/2014); Pneumonia (1990's X 2); Sleep apnea; Anemia; History of blood transfusion (1990's X 2); Migraines; Arthritis; Gout; Urinary incontinence; Chronic kidney disease (CKD), stage III (moderate); Neuropathy; and Diabetes mellitus.   Patient has long-standing history of lymphedema associated recurrent cellulitis with recurrent admissions for sepsis. Patient is diabetic. She reports 2 day history of leg swelling redness and warmth. She reports severe left knee pain.  At her baseline patient is able to walk and tolerate putting pressure on her leg. Since yesterday her pain was severe she is unable to bend the knee. She has history of blood clots but states this is different this feels more like cellulitis. Today she developed fever and called 911. Emergency department patient was started on aztreonam given a dose of Flagyl and vancomycin. Plain films of the lower extremity were obtained showed no evidence of gas soft tissues no knee effusion. Lactic acid was 1.86. Patient did meet criteria for sepsis with elevated white blood cell count up to 15.2 fever up to 101.3 at rate of 98 and respirations of 20. Patient  Was NOT  hypotensive. patient states she have had good control of her blood sugar. Given severity of knee pain. Orthopedics has been consulted and their recommendations is to continue with IV antibiotics. And reconsult them in the morning if knee pain persists for further workup of possible septic knee.  Hospitalist was called for admission for sepsis due to  cellulitis  Review of Systems:    Pertinent positives include: Fevers, chills, Bilateral lower extremity swelling   Constitutional:  No weight loss, night sweats,  fatigue, weight loss  HEENT:  No headaches, Difficulty swallowing,Tooth/dental problems,Sore throat,  No sneezing, itching, ear ache, nasal congestion, post nasal drip,  Cardio-vascular:  No chest pain, Orthopnea, PND, anasarca, dizziness, palpitations.no  GI:  No heartburn, indigestion, abdominal pain, nausea, vomiting, diarrhea, change in bowel habits, loss of appetite, melena, blood in stool, hematemesis Resp:  no shortness of breath at rest. No dyspnea on exertion, No excess mucus, no productive cough, No non-productive cough, No coughing up of blood.No change in color of mucus.No wheezing. Skin:  no rash or lesions. No jaundice GU:  no dysuria, change in color of urine, no urgency or frequency. No straining to urinate.  No flank pain.  Musculoskeletal:  No joint pain or no joint swelling. No decreased range of motion. No back pain.  Psych:  No change in mood or affect. No depression or anxiety. No memory loss.  Neuro: no localizing neurological complaints, no tingling, no weakness, no double vision, no gait abnormality, no slurred speech, no confusion  Otherwise ROS are negative except for above, 10 systems were reviewed  Past Medical History: Past Medical History  Diagnosis Date  . Hypertension   . Hyperlipidemia   . CHF (congestive heart failure)   . GERD (gastroesophageal reflux disease)   . Depression   . Lymphedema of leg     left leg  . Pulmonary embolism 02/18/14    diagnosed at [facility] with CT angio chest  . Chronic indwelling Foley catheter   . Heart murmur     "  just found out I have one" (04/07/2014)  . DVT (deep venous thrombosis) 01/2014    LLE; "went from my leg to my lungs"  . Pneumonia 1990's X 2  . Sleep apnea     doesn't use cpap machine or 02 at night (04/07/2014)  . Anemia   . History of  blood transfusion 1990's X 2    "when they did my surgeries"  . Migraines     "none for years now" (04/07/2014)  . Arthritis     "arms, legs" (04/07/2014)  . Gout   . Urinary incontinence   . Chronic kidney disease (CKD), stage III (moderate)     [name] 04/07/2014  . Neuropathy   . Diabetes mellitus     "at one time" (04/07/2014)   Past Surgical History  Procedure Laterality Date  . Shoulder open rotator cuff repair Right ~ 2000  . Knee arthroscopy Right ?1980's  . Temporal artery biopsy / ligation Right ?1990's  . Colonoscopy with propofol  09/23/2012    Procedure: COLONOSCOPY WITH PROPOFOL;  Surgeon: Jerene Bears, MD;  Location: [facility];  Service: Gastroenterology;  Laterality: N/A;  . Panniculectomy Left 02/08/2014    w/wound debridement @ medial thigh  . Irrigation and debridement abscess Left 02/24/2014    Procedure: MINOR INCISION AND DRAINAGE OF ABSCESS;  Surgeon: Theodoro Kos, DO;  Location: [facility];  Service: Plastics;  Laterality: Left;  . I&d extremity Left 03/03/2014    Procedure: IRRIGATION AND DEBRIDEMENT LEFT LEG WOUND AND PLACEMENT OF A CELL AND VAC;  Surgeon: Theodoro Kos, DO;  Location: [place];  Service: Plastics;  Laterality: Left;  . Incision and drainage of wound Left 03/16/2014    Procedure: IRRIGATION AND DEBRIDEMENT OF LEFT THIGH WOUND, APPLICATION ACELL;  Surgeon: Irene Limbo, MD;  Location: [place];  Service: Plastics;  Laterality: Left;  . Incision and drainage of wound Left 03/25/2014    Procedure: IRRIGATION AND DEBRIDEMENT OF LEFT LEG WOUND WITH PLACEMENT OF ACELL/VAC;  Surgeon: Theodoro Kos, DO;  Location: [place];  Service: Plastics;  Laterality: Left;  . Incision and drainage of wound Left 03/31/2014    Procedure: IRRIGATION AND DEBRIDEMENT LEFT LEG WOUND WITH PLACEMENT OF A-CELL/VAC;  Surgeon: Theodoro Kos, DO;  Location: [place];  Service: Plastics;  Laterality: Left;  . Cataract extraction w/ intraocular lens implant Right 2014  . Vena cava filter  placement  02/2014  . Incision and drainage of wound Left 04/07/2014    Procedure: IRRIGATION AND DEBRIDEMENT LEFT LEG WOUND WITH PLACEMENT OF A CELL AND VAC;  Surgeon: Theodoro Kos, DO;  Location: [place];  Service: Plastics;  Laterality: Left;  . Incision and drainage of wound Left 04/14/2014    Procedure: IRRIGATION AND DEBRIDEMENT WOUND;  Surgeon: Theodoro Kos, DO;  Location: [place];  Service: Plastics;  Laterality: Left;  . Application of a-cell of extremity Left 04/14/2014    Procedure: APPLICATION OF A-CELL OF EXTREMITY;  Surgeon: Theodoro Kos, DO;  Location: [place];  Service: Plastics;  Laterality: Left;  Marland Kitchen Minor application of wound vac Left 04/14/2014    Procedure: MINOR APPLICATION OF WOUND VAC;  Surgeon: Theodoro Kos, DO;  Location: [place];  Service: Plastics;  Laterality: Left;  . Incision and drainage of wound Left 04/21/2014    Procedure: IRRIGATION AND DEBRIDEMENT OF LEFT UPPER LEG WOUND WITH PLACEMENT OF ACELL/VAC;  Surgeon: Theodoro Kos, DO;  Location: [place];  Service: Plastics;  Laterality: Left;     Medications: Prior to Admission  medications   Medication Sig Start Date End Date Taking? Authorizing Provider  amLODipine (NORVASC) 10 MG tablet Take 1 tablet (10 mg total) by mouth daily. 11/26/14  Yes Venetia Maxon Rama, MD  cholecalciferol (VITAMIN D) 1000 UNITS tablet Take 1,000 Units by mouth daily.   Yes Historical Provider, MD  colchicine 0.6 MG tablet Take 2 tablets by mouth daily. 11/09/14  Yes Historical Provider, MD  diclofenac sodium (VOLTAREN) 1 % GEL Apply 2 g topically 4 (four) times daily. 11/26/14  Yes Venetia Maxon Rama, MD  esomeprazole (NEXIUM) 40 MG capsule Take 40 mg by mouth daily before breakfast. Take on an empty stomach   Yes Historical Provider, MD  furosemide (LASIX) 40 MG tablet Take 40 mg by mouth 2 (two) times daily.    Yes Historical Provider, MD  gabapentin (NEURONTIN) 300 MG capsule Take 300 mg by mouth 3 (three) times daily.   Yes Historical Provider, MD    isosorbide mononitrate (ISMO,MONOKET) 10 MG tablet Take 10 mg by mouth 2 (two) times daily.   Yes Historical Provider, MD  metoprolol tartrate (LOPRESSOR) 25 MG tablet Take 3 tablets (75 mg total) by mouth 2 (two) times daily. 05/14/14  Yes Orson Eva, MD  Nutritional Supplements (JUVEN NUTRIVIGOR) PACK Take 1 Container by mouth every other day.    Yes Historical Provider, MD  Oxycodone HCl 10 MG TABS Take 1 tablet (10 mg total) by mouth every 4 (four) hours as needed (moderate pain). 07/18/14  Yes Theodis Blaze, MD  polyethylene glycol (MIRALAX / Floria Raveling) packet Take 17 g by mouth daily. Mix with 4-6 oz liquid   Yes Historical Provider, MD  promethazine (PHENERGAN) 25 MG tablet Take 25 mg by mouth every 6 (six) hours as needed for nausea or vomiting.   Yes Historical Provider, MD  saccharomyces boulardii (FLORASTOR) 250 MG capsule Take 1 capsule (250 mg total) by mouth 2 (two) times daily. Patient taking differently: Take 250 mg by mouth every other day.  09/09/14  Yes Simbiso Ranga, MD  Kelly (SENOKOT) 8.6 MG TABS tablet Take 1 tablet (8.6 mg total) by mouth 2 (two) times daily. 04/09/14  Yes Marianne L [name], PA-C  zolpidem (AMBIEN) 5 MG tablet Take 1 tablet (5 mg total) by mouth at bedtime as needed for sleep. 07/18/14  Yes Theodis Blaze, MD  hydrOXYzine (ATARAX/VISTARIL) 25 MG tablet Take 1 tablet by mouth 3 (three) times daily as needed for itching (itching).  09/21/14   Historical Provider, MD    Allergies:   Allergies  Allergen Reactions  . Bactrim [Sulfamethoxazole-Trimethoprim] Other (See Comments)    Hyperkalemia (July 2015)  . Penicillins Hives    Has taken cephalexin 06/2014    Social History:  Ambulatory   Independently or  walker   Lives at home alone,         reports that she quit smoking about 33 years ago. Her smoking use included Cigarettes. She has a 33 pack-year smoking history. She has never used smokeless tobacco. She reports that she drinks alcohol. She reports that  she does not use illicit drugs.    Family History: family history includes Asthma in her brother; Diabetes in her mother and sister; Emphysema in her father; Heart disease in her maternal grandmother and mother; Stomach cancer in her brother.    Physical Exam: Patient Vitals for the past 24 hrs:  BP Temp Temp src Pulse Resp SpO2 Weight  12/15/14 2300 (!) 135/53 mmHg - - 88 11 98 % -  12/15/14 2200 (!) 135/53 mmHg - - 94 14 96 % -  12/15/14 2148 - 99.2 F (37.3 C) - - - - -  12/15/14 2131 (!) 149/50 mmHg - - 94 13 93 % -  12/15/14 2115 (!) 149/50 mmHg - - 96 12 94 % -  12/15/14 2100 (!) 150/50 mmHg - - 95 12 91 % -  12/15/14 2045 128/94 mmHg - - 95 15 92 % -  12/15/14 2030 142/57 mmHg - - 97 13 94 % -  12/15/14 2027 149/68 mmHg - - 98 14 95 % -  12/15/14 1918 162/77 mmHg 101.3 F (38.5 C) Rectal 98 17 94 % (!) 139.708 kg (308 lb)    1. General:  appears in a lot of pain 2. Psychological: Alert and Oriented 3. Head/ENT:    Dry Mucous Membranes                          Head Non traumatic, neck supple                          Normal   Dentition 4. SKIN: normal  Skin turgor,  Skin clean Dry warmth, erythema, swelling over left leg 5. Heart: Regular rate and rhythm no Murmur, Rub or gallop 6. Lungs: Clear to auscultation bilaterally, no wheezes or crackles   7. Abdomen: Soft, non-tender, Non distended 8. Lower extremities: no clubbing, cyanosis, or edema 9. Neurologically Grossly intact, unable to assess lower extremity due to pain 10. MSK: Diminished range of motion on the left knee. Patient states it's  excruciating painful to move  body mass index is 49.74 kg/(m^2).   Labs on Admission:   Results for orders placed or performed during the hospital encounter of 12/15/14 (from the past 24 hour(s))  Comprehensive metabolic panel     Status: Abnormal   Collection Time: 12/15/14  7:10 PM  Result Value Ref Range   Sodium 137 135 - 145 mmol/L   Potassium 4.5 3.5 - 5.1 mmol/L    Chloride 102 96 - 112 mmol/L   CO2 24 19 - 32 mmol/L   Glucose, Bld 214 (H) 70 - 99 mg/dL   BUN 40 (H) 6 - 23 mg/dL   Creatinine, Ser 2.18 (H) 0.50 - 1.10 mg/dL   Calcium 8.4 8.4 - 10.5 mg/dL   Total Protein 8.7 (H) 6.0 - 8.3 g/dL   Albumin 2.9 (L) 3.5 - 5.2 g/dL   AST 18 0 - 37 U/L   ALT 12 0 - 35 U/L   Alkaline Phosphatase 97 39 - 117 U/L   Total Bilirubin 0.4 0.3 - 1.2 mg/dL   GFR calc non Af Amer 22 (L) >90 mL/min   GFR calc Af Amer 26 (L) >90 mL/min   Anion gap 11 5 - 15  CBC with Differential     Status: Abnormal   Collection Time: 12/15/14  7:10 PM  Result Value Ref Range   WBC 15.2 (H) 4.0 - 10.5 K/uL   RBC 4.29 3.87 - 5.11 MIL/uL   Hemoglobin 12.1 12.0 - 15.0 g/dL   HCT 38.1 36.0 - 46.0 %   MCV 88.8 78.0 - 100.0 fL   MCH 28.2 26.0 - 34.0 pg   MCHC 31.8 30.0 - 36.0 g/dL   RDW 15.4 11.5 - 15.5 %   Platelets 231 150 - 400 K/uL   Neutrophils Relative % 88 (H) 43 - 77 %  Neutro Abs 13.3 (H) 1.7 - 7.7 K/uL   Lymphocytes Relative 7 (L) 12 - 46 %   Lymphs Abs 1.1 0.7 - 4.0 K/uL   Monocytes Relative 5 3 - 12 %   Monocytes Absolute 0.8 0.1 - 1.0 K/uL   Eosinophils Relative 0 0 - 5 %   Eosinophils Absolute 0.0 0.0 - 0.7 K/uL   Basophils Relative 0 0 - 1 %   Basophils Absolute 0.0 0.0 - 0.1 K/uL  I-Stat CG4 Lactic Acid, ED     Status: None   Collection Time: 12/15/14  7:22 PM  Result Value Ref Range   Lactic Acid, Venous 1.86 0.5 - 2.0 mmol/L  Urinalysis, Routine w reflex microscopic     Status: Abnormal   Collection Time: 12/15/14  9:26 PM  Result Value Ref Range   Color, Urine YELLOW YELLOW   APPearance CLOUDY (A) CLEAR   Specific Gravity, Urine 1.014 1.005 - 1.030   pH 6.5 5.0 - 8.0   Glucose, UA NEGATIVE NEGATIVE mg/dL   Hgb urine dipstick TRACE (A) NEGATIVE   Bilirubin Urine NEGATIVE NEGATIVE   Ketones, ur NEGATIVE NEGATIVE mg/dL   Protein, ur 100 (A) NEGATIVE mg/dL   Urobilinogen, UA 0.2 0.0 - 1.0 mg/dL   Nitrite NEGATIVE NEGATIVE   Leukocytes, UA TRACE (A)  NEGATIVE  Urine microscopic-add on     Status: Abnormal   Collection Time: 12/15/14  9:26 PM  Result Value Ref Range   Squamous Epithelial / LPF MANY (A) RARE   WBC, UA 7-10 <3 WBC/hpf   Bacteria, UA FEW (A) RARE   Casts HYALINE CASTS (A) NEGATIVE    UA 7 white blood cells and few bacteria nitrates negative.  Lab Results  Component Value Date   HGBA1C 6.6* 09/03/2014    Estimated Creatinine Clearance: 36.2 mL/min (by C-G formula based on Cr of 2.18).  BNP (last 3 results)  Recent Labs  07/11/14 1959  PROBNP 2482.0*    Other results:  I have pearsonaly reviewed this: ECG NOT OBTAINED   Filed Weights   12/15/14 1918  Weight: 139.708 kg (308 lb)     Cultures:    Component Value Date/Time   SDES URINE, CATHETERIZED 10/14/2014 1627   SPECREQUEST Normal 10/14/2014 1627   CULT  10/14/2014 1627    ESCHERICHIA COLI Performed at [facility] 10/16/2014 FINAL 10/14/2014 1627     Radiological Exams on Admission: Dg Chest Portable 1 View  12/15/2014   CLINICAL DATA:  Cellulitis and hypertension  EXAM: PORTABLE CHEST - 1 VIEW  COMPARISON:  11/23/2014  FINDINGS: There is chronic cardiomegaly and chronic fullness of the hila which is likely vascular. There is pulmonary venous congestion and cephalization without overt edema. No convincing pneumonia or pleural effusion.  IMPRESSION: Cardiomegaly and pulmonary venous congestion.   Electronically Signed   By: Monte Fantasia M.D.   On: 12/15/2014 22:25   Dg Tibia/fibula Left Port  12/15/2014   CLINICAL DATA:  Left leg cellulitis, left lower leg pain.  EXAM: PORTABLE LEFT TIBIA AND FIBULA - 2 VIEW  COMPARISON:  05/09/2014 CT  FINDINGS: Osteopenia. Degenerative changes about the knee, incompletely assessed. Atherosclerotic vascular calcifications. No displaced fracture or aggressive osseous lesion. Diffuse soft tissue swelling. Posterior calcaneal enthesophyte.  IMPRESSION: Osteopenia and advanced  atherosclerotic disease. Soft tissue swelling. No acute or aggressive osseous finding of the left tibia/fibula.   Electronically Signed   By: Carlos Levering M.D.   On: 12/15/2014 22:27  Chart has been reviewed  Assessment/Plan  69 year old diabetic with history of lymphedema, CHF, chronic kidney disease presents with recurrent cellulitis resulting in sepsis. Now also severe left knee pain. Patient is admitted for IV antibiotics and monitoring. If knee pain does not resolve despite antibiotic treatment orthopedics wishes to be reconsulted in the morning  Present on Admission:  . Sepsis broad-spectrum IV antibiotics. Hold Lasix. Hold Norvasc. Continue to monitor  . Cellulitis - treat with broad-spectrum antibiotics given recurrent cellulitis and patient being diabetic. Admitted past several allergies to penicillins  Knee pain  - worrisome for septic joint. Have discussed with orthopedics with this point recommends continuation of IV antibiotics and reassessment in the morning. If pain does not improve despite broad-spectrum antibiotics. We'll need to reconsult orthopedics in the morning for evaluation of septic knee. In case patient does need a procedure will keep nothing by mouth for now the case has been discussed by ER physician Dr. Ronnie Derby . Morbid obesity chronic with comorbidities including diabetes and heart failure  . Chronic diastolic heart failure holding Lasix for today given worsening renal function and sepsis (need to restart once patient was stable  . Chronic venous insufficiency likely contributing to chronic cellulitis  . Chronic kidney disease, stage 3 worsening renal function will hold Lasix continue to monitor  . DM type 2 causing CKD stage 3 - since to sliding scale  . Coronary atherosclerosis of native coronary artery   Prophylaxis:   SCD, Protonix  CODE STATUS:  FULL CODE   Other plan as per orders.  I have spent a total of 55 min on this admission  No family at  bedside. Patient states she lives alone  [facility] 12/15/2014, 11:53 PM  [facility]  Pager [phone]   after 2 AM please page floor coverage PA If 7AM-7PM, please contact the day team taking care of the patient  [email or web address]  Password [email or web address]

## 2014-12-15 NOTE — ED Provider Notes (Addendum)
CSN: [id number]     Arrival date & time 12/15/14  1824 History   First MD Initiated Contact with Patient 12/15/14 1848     Chief Complaint  Patient presents with  . Cellulitis  . Fever     (Consider location/radiation/quality/duration/timing/severity/associated sxs/prior Treatment) HPI  69 year old female resents with recurrent left leg cellulitis. Started feeling leg pain, warmth, and redness as well as a fever starting yesterday. Has had cellulitis in her legs multiple times. Has also had DVTs but does not think this is like a DVT. Has noticed some swelling. Has chronic lymphedema as well. Denies any cough, shortness of breath, or dysuria. No drainage.  Past Medical History  Diagnosis Date  . Hypertension   . Hyperlipidemia   . CHF (congestive heart failure)   . GERD (gastroesophageal reflux disease)   . Depression   . Lymphedema of leg     left leg  . Pulmonary embolism 02/18/14    diagnosed at [facility] with CT angio chest  . Chronic indwelling Foley catheter   . Heart murmur     "just found out I have one" (04/07/2014)  . DVT (deep venous thrombosis) 01/2014    LLE; "went from my leg to my lungs"  . Pneumonia 1990's X 2  . Sleep apnea     doesn't use cpap machine or 02 at night (04/07/2014)  . Anemia   . History of blood transfusion 1990's X 2    "when they did my surgeries"  . Migraines     "none for years now" (04/07/2014)  . Arthritis     "arms, legs" (04/07/2014)  . Gout   . Urinary incontinence   . Chronic kidney disease (CKD), stage III (moderate)     [name] 04/07/2014  . Neuropathy   . Diabetes mellitus     "at one time" (04/07/2014)   Past Surgical History  Procedure Laterality Date  . Shoulder open rotator cuff repair Right ~ 2000  . Knee arthroscopy Right ?1980's  . Temporal artery biopsy / ligation Right ?1990's  . Colonoscopy with propofol  09/23/2012    Procedure: COLONOSCOPY WITH PROPOFOL;  Surgeon: Jerene Bears, MD;  Location: [facility];  Service:  Gastroenterology;  Laterality: N/A;  . Panniculectomy Left 02/08/2014    w/wound debridement @ medial thigh  . Irrigation and debridement abscess Left 02/24/2014    Procedure: MINOR INCISION AND DRAINAGE OF ABSCESS;  Surgeon: Theodoro Kos, DO;  Location: [facility];  Service: Plastics;  Laterality: Left;  . I&d extremity Left 03/03/2014    Procedure: IRRIGATION AND DEBRIDEMENT LEFT LEG WOUND AND PLACEMENT OF A CELL AND VAC;  Surgeon: Theodoro Kos, DO;  Location: [place];  Service: Plastics;  Laterality: Left;  . Incision and drainage of wound Left 03/16/2014    Procedure: IRRIGATION AND DEBRIDEMENT OF LEFT THIGH WOUND, APPLICATION ACELL;  Surgeon: Irene Limbo, MD;  Location: [place];  Service: Plastics;  Laterality: Left;  . Incision and drainage of wound Left 03/25/2014    Procedure: IRRIGATION AND DEBRIDEMENT OF LEFT LEG WOUND WITH PLACEMENT OF ACELL/VAC;  Surgeon: Theodoro Kos, DO;  Location: [place];  Service: Plastics;  Laterality: Left;  . Incision and drainage of wound Left 03/31/2014    Procedure: IRRIGATION AND DEBRIDEMENT LEFT LEG WOUND WITH PLACEMENT OF A-CELL/VAC;  Surgeon: Theodoro Kos, DO;  Location: [place];  Service: Plastics;  Laterality: Left;  . Cataract extraction w/ intraocular lens implant Right 2014  . Vena cava filter placement  02/2014  .  Incision and drainage of wound Left 04/07/2014    Procedure: IRRIGATION AND DEBRIDEMENT LEFT LEG WOUND WITH PLACEMENT OF A CELL AND VAC;  Surgeon: Theodoro Kos, DO;  Location: [place];  Service: Plastics;  Laterality: Left;  . Incision and drainage of wound Left 04/14/2014    Procedure: IRRIGATION AND DEBRIDEMENT WOUND;  Surgeon: Theodoro Kos, DO;  Location: [place];  Service: Plastics;  Laterality: Left;  . Application of a-cell of extremity Left 04/14/2014    Procedure: APPLICATION OF A-CELL OF EXTREMITY;  Surgeon: Theodoro Kos, DO;  Location: [place];  Service: Plastics;  Laterality: Left;  Marland Kitchen Minor application of wound vac Left 04/14/2014    Procedure:  MINOR APPLICATION OF WOUND VAC;  Surgeon: Theodoro Kos, DO;  Location: [place];  Service: Plastics;  Laterality: Left;  . Incision and drainage of wound Left 04/21/2014    Procedure: IRRIGATION AND DEBRIDEMENT OF LEFT UPPER LEG WOUND WITH PLACEMENT OF ACELL/VAC;  Surgeon: Theodoro Kos, DO;  Location: [place];  Service: Plastics;  Laterality: Left;   Family History  Problem Relation Age of Onset  . Emphysema Father   . Asthma Brother   . Heart disease Mother   . Stomach cancer Brother   . Heart disease Maternal Grandmother   . Diabetes Mother   . Diabetes Sister    History  Substance Use Topics  . Smoking status: Former Smoker -- 1.00 packs/day for 33 years    Types: Cigarettes    Quit date: 08/27/1981  . Smokeless tobacco: Never Used  . Alcohol Use: Yes     Comment: 04/07/2014 "used to drink; stopped in ~ 1983"   OB History    No data available     Review of Systems  Constitutional: Positive for fever.  Respiratory: Negative for cough and shortness of breath.   Cardiovascular: Positive for leg swelling.  Gastrointestinal: Negative for vomiting and abdominal pain.  Genitourinary: Negative for dysuria.  Skin: Positive for color change.  All other systems reviewed and are negative.     Allergies  Bactrim and Penicillins  Home Medications   Prior to Admission medications   Medication Sig Start Date End Date Taking? Authorizing Provider  amLODipine (NORVASC) 10 MG tablet Take 1 tablet (10 mg total) by mouth daily. 11/26/14   Venetia Maxon Rama, MD  cholecalciferol (VITAMIN D) 1000 UNITS tablet Take 1,000 Units by mouth daily.    Historical Provider, MD  colchicine 0.6 MG tablet Take 2 tablets by mouth daily. 11/09/14   Historical Provider, MD  diclofenac sodium (VOLTAREN) 1 % GEL Apply 2 g topically 4 (four) times daily. 11/26/14   Venetia Maxon Rama, MD  esomeprazole (NEXIUM) 40 MG capsule Take 40 mg by mouth daily before breakfast. Take on an empty stomach    Historical Provider, MD   furosemide (LASIX) 40 MG tablet Take 40 mg by mouth 2 (two) times daily.     Historical Provider, MD  gabapentin (NEURONTIN) 300 MG capsule Take 300 mg by mouth 3 (three) times daily.    Historical Provider, MD  hydrOXYzine (ATARAX/VISTARIL) 25 MG tablet Take 1 tablet by mouth 3 (three) times daily as needed for itching.  09/21/14   Historical Provider, MD  isosorbide mononitrate (ISMO,MONOKET) 10 MG tablet Take 10 mg by mouth 2 (two) times daily.    Historical Provider, MD  metoprolol tartrate (LOPRESSOR) 25 MG tablet Take 3 tablets (75 mg total) by mouth 2 (two) times daily. 05/14/14   Orson Eva, MD  Nutritional Supplements (  JUVEN NUTRIVIGOR) PACK Take 1 Container by mouth every other day.     Historical Provider, MD  Oxycodone HCl 10 MG TABS Take 1 tablet (10 mg total) by mouth every 4 (four) hours as needed (moderate pain). 07/18/14   Theodis Blaze, MD  polyethylene glycol (MIRALAX / Floria Raveling) packet Take 17 g by mouth daily. Mix with 4-6 oz liquid    Historical Provider, MD  promethazine (PHENERGAN) 25 MG tablet Take 25 mg by mouth every 6 (six) hours as needed for nausea or vomiting.    Historical Provider, MD  saccharomyces boulardii (FLORASTOR) 250 MG capsule Take 1 capsule (250 mg total) by mouth 2 (two) times daily. Patient taking differently: Take 250 mg by mouth every other day.  09/09/14   Simbiso Ranga, MD  senna (SENOKOT) 8.6 MG TABS tablet Take 1 tablet (8.6 mg total) by mouth 2 (two) times daily. 04/09/14   Bobby Rumpf [name], PA-C  zolpidem (AMBIEN) 5 MG tablet Take 1 tablet (5 mg total) by mouth at bedtime as needed for sleep. 07/18/14   Theodis Blaze, MD   BP 162/77 mmHg  Pulse 98  Temp(Src) 101.3 F (38.5 C) (Rectal)  Resp 17  SpO2 94% Physical Exam  Constitutional: She is oriented to person, place, and time. She appears well-developed and well-nourished.  Morbidly obese  HENT:  Head: Normocephalic and atraumatic.  Right Ear: External ear normal.  Left Ear: External ear  normal.  Nose: Nose normal.  Eyes: Right eye exhibits no discharge. Left eye exhibits no discharge.  Cardiovascular: Normal rate, regular rhythm and normal heart sounds.   Pulmonary/Chest: Effort normal and breath sounds normal.  Abdominal: Soft. There is no tenderness.  Musculoskeletal: She exhibits tenderness.       Left lower leg: She exhibits tenderness and swelling.       Legs: Neurological: She is alert and oriented to person, place, and time.  Skin: Skin is warm and dry.  Nursing note and vitals reviewed.   ED Course  Procedures (including critical care time) Labs Review Labs Reviewed  COMPREHENSIVE METABOLIC PANEL - Abnormal; Notable for the following:    Glucose, Bld 214 (*)    BUN 40 (*)    Creatinine, Ser 2.18 (*)    Total Protein 8.7 (*)    Albumin 2.9 (*)    GFR calc non Af Amer 22 (*)    GFR calc Af Amer 26 (*)    All other components within normal limits  CBC WITH DIFFERENTIAL/PLATELET - Abnormal; Notable for the following:    WBC 15.2 (*)    Neutrophils Relative % 88 (*)    Neutro Abs 13.3 (*)    Lymphocytes Relative 7 (*)    All other components within normal limits  URINALYSIS, ROUTINE W REFLEX MICROSCOPIC - Abnormal; Notable for the following:    APPearance CLOUDY (*)    Hgb urine dipstick TRACE (*)    Protein, ur 100 (*)    Leukocytes, UA TRACE (*)    All other components within normal limits  URINE MICROSCOPIC-ADD ON - Abnormal; Notable for the following:    Squamous Epithelial / LPF MANY (*)    Bacteria, UA FEW (*)    Casts HYALINE CASTS (*)    All other components within normal limits  URINE CULTURE  CULTURE, BLOOD (ROUTINE X 2)  CULTURE, BLOOD (ROUTINE X 2)  I-STAT CG4 LACTIC ACID, ED    Imaging Review Dg Chest Portable 1 View  12/15/2014   CLINICAL  DATA:  Cellulitis and hypertension  EXAM: PORTABLE CHEST - 1 VIEW  COMPARISON:  11/23/2014  FINDINGS: There is chronic cardiomegaly and chronic fullness of the hila which is likely vascular.  There is pulmonary venous congestion and cephalization without overt edema. No convincing pneumonia or pleural effusion.  IMPRESSION: Cardiomegaly and pulmonary venous congestion.   Electronically Signed   By: Monte Fantasia M.D.   On: 12/15/2014 22:25   Dg Tibia/fibula Left Port  12/15/2014   CLINICAL DATA:  Left leg cellulitis, left lower leg pain.  EXAM: PORTABLE LEFT TIBIA AND FIBULA - 2 VIEW  COMPARISON:  05/09/2014 CT  FINDINGS: Osteopenia. Degenerative changes about the knee, incompletely assessed. Atherosclerotic vascular calcifications. No displaced fracture or aggressive osseous lesion. Diffuse soft tissue swelling. Posterior calcaneal enthesophyte.  IMPRESSION: Osteopenia and advanced atherosclerotic disease. Soft tissue swelling. No acute or aggressive osseous finding of the left tibia/fibula.   Electronically Signed   By: Carlos Levering M.D.   On: 12/15/2014 22:27     EKG Interpretation None      MDM   Final diagnoses:  Left leg cellulitis    Patient with recurrent left leg cellulitis. Also having a fever with elevated white blood cell count and AKI. Due to this, she was given broad-spectrum antibiotics, fluids, will need admission to the hospitalist. No hypotension. No crepitus or evidence of gas on x-ray. Doubt more sinister deep space infection.    Sherwood Gambler, MD 12/15/14 2334  On further evaluation with hospitalist patient has significant knee tenderness that was not noticed earlier. She has decreased ROM with pain. There is concern for possible septic knee. She has overlying cellulitis over knee joint and thus I do not feel comfortable aspirating the knee. D/w Ortho, Dr. Ronnie Derby, who recommends IV antibiotics and if no improvement in 8-10 hours then re-consult him and will likely need aspiration even through the cellulitis. Updated hospitalist, Dr. Roel Cluck.  Sherwood Gambler, MD 12/16/14 [phone]

## 2014-12-15 NOTE — ED Notes (Signed)
Per EMS, pt complains of leg pain and chills since yesterday. Pt has fever of 101.3 upon arrival. Pt has history of cellulitis to both legs, diabetic neuropathy, and CHF. Pt was hospitalized for sepsis 2 months ago. Pt A&O.

## 2014-12-15 NOTE — ED Notes (Signed)
Extensive difficulty obtaining IV access.  Pt shivering and has very little vasculature to stick.  2 [place] IV nurses at bedside using an ultrasound on bilateral arms in attempt to obtain access.  24 g in rt first finger.  Another nurse at bedside attempting.

## 2014-12-15 NOTE — Progress Notes (Signed)
ANTIBIOTIC CONSULT NOTE - INITIAL  Pharmacy Consult for Vancomycin, Flagyl, Aztreonam Indication: Cellulitis  Allergies  Allergen Reactions  . Bactrim [Sulfamethoxazole-Trimethoprim] Other (See Comments)    Hyperkalemia (July 2015)  . Penicillins Hives    Has taken cephalexin 06/2014    Patient Measurements:   Adjusted Body Weight:   Vital Signs: Temp: 101.3 F (38.5 C) (04/20 1918) Temp Source: Rectal (04/20 1918) BP: 162/77 mmHg (04/20 1918) Pulse Rate: 98 (04/20 1918) Intake/Output from previous day:   Intake/Output from this shift:    Labs:  Recent Labs  12/14/14 1045 12/15/14 1910  WBC  --  15.2*  HGB 10.8* 12.1  PLT  --  231   CrCl cannot be calculated (Unknown ideal weight.). No results for input(s): VANCOTROUGH, VANCOPEAK, VANCORANDOM, GENTTROUGH, GENTPEAK, GENTRANDOM, TOBRATROUGH, TOBRAPEAK, TOBRARND, AMIKACINPEAK, AMIKACINTROU, AMIKACIN in the last 72 hours.   Microbiology: Recent Results (from the past 720 hour(s))  MRSA PCR Screening     Status: Abnormal   Collection Time: 11/23/14 11:47 AM  Result Value Ref Range Status   MRSA by PCR POSITIVE (A) NEGATIVE Final    Comment:        The GeneXpert MRSA Assay (FDA approved for NASAL specimens only), is one component of a comprehensive MRSA colonization surveillance program. It is not intended to diagnose MRSA infection nor to guide or monitor treatment for MRSA infections. RESULT CALLED TO, READ BACK BY AND VERIFIED WITH: T.NICHOLS,RN AT 0055 ON 11/24/14 BY W.SHEA     Medical History: Past Medical History  Diagnosis Date  . Hypertension   . Hyperlipidemia   . CHF (congestive heart failure)   . GERD (gastroesophageal reflux disease)   . Depression   . Lymphedema of leg     left leg  . Pulmonary embolism 02/18/14    diagnosed at [facility] with CT angio chest  . Chronic indwelling Foley catheter   . Heart murmur     "just found out I have one" (04/07/2014)  . DVT (deep venous thrombosis)  01/2014    LLE; "went from my leg to my lungs"  . Pneumonia 1990's X 2  . Sleep apnea     doesn't use cpap machine or 02 at night (04/07/2014)  . Anemia   . History of blood transfusion 1990's X 2    "when they did my surgeries"  . Migraines     "none for years now" (04/07/2014)  . Arthritis     "arms, legs" (04/07/2014)  . Gout   . Urinary incontinence   . Chronic kidney disease (CKD), stage III (moderate)     [name] 04/07/2014  . Neuropathy   . Diabetes mellitus     "at one time" (04/07/2014)    Assessment: 66 yoF with complicated PMHx including recurrent cellulitis, diabetic neuropathy, DVTs, PE, CHF (EF 55-60% 3/30/'16), and CKD-III, presents with leg pain, warm, redness, and fever.  Pharmacy consulted to start vancomycin, flagyl, and aztreonam for sepsis 2/2 cellulitis.  Allergies noted to bactrim and penicillins. First doses ordered in ED.   4/20 >> Vancomycin  >> 4/20 >> Flagyl  >>   4/20 >> Aztreonam >>  Tm24h: 101.3 WBC: Elevated 15.2K Renal: CKD-III, SCr 2.18 (baseline 1.5-1.7?), CrCl 36 CG (N28) Lactic Acid 1.86 (4/20)  4/20 blood x2: Ordered 4/20 urine: Ordered   Drug levels: Prior Vanc trough 18.9 (07/18/14) on Vanc 1500mg  q24h, SCr 1.2-1.5  Goal of Therapy:  Vancomycin trough 10-15 mcg/ml  Eradication of infection  Plan:  Flagyl 500mg  IV q8h  Aztreonam 1g IV q8h Vancomycin 2500mg  IV x 1, then 1750mg  IVq48h F/u renal fxn closely to adjust doses  Ralene Bathe, PharmD, BCPS 12/15/2014, 8:04 PM  Pager: [phone]

## 2014-12-16 ENCOUNTER — Encounter: Payer: Self-pay | Admitting: Orthopedic Surgery

## 2014-12-16 ENCOUNTER — Inpatient Hospital Stay (HOSPITAL_COMMUNITY): Payer: Medicare Other

## 2014-12-16 DIAGNOSIS — M009 Pyogenic arthritis, unspecified: Secondary | ICD-10-CM

## 2014-12-16 LAB — CBC
HCT: 35.3 % — ABNORMAL LOW (ref 36.0–46.0)
HEMOGLOBIN: 11.1 g/dL — AB (ref 12.0–15.0)
MCH: 27.7 pg (ref 26.0–34.0)
MCHC: 31.4 g/dL (ref 30.0–36.0)
MCV: 88 fL (ref 78.0–100.0)
Platelets: 207 10*3/uL (ref 150–400)
RBC: 4.01 MIL/uL (ref 3.87–5.11)
RDW: 15.4 % (ref 11.5–15.5)
WBC: 13 10*3/uL — ABNORMAL HIGH (ref 4.0–10.5)

## 2014-12-16 LAB — COMPREHENSIVE METABOLIC PANEL
ALT: 11 U/L (ref 0–35)
ANION GAP: 10 (ref 5–15)
AST: 18 U/L (ref 0–37)
Albumin: 2.4 g/dL — ABNORMAL LOW (ref 3.5–5.2)
Alkaline Phosphatase: 90 U/L (ref 39–117)
BILIRUBIN TOTAL: 0.6 mg/dL (ref 0.3–1.2)
BUN: 37 mg/dL — AB (ref 6–23)
CHLORIDE: 105 mmol/L (ref 96–112)
CO2: 25 mmol/L (ref 19–32)
Calcium: 8.3 mg/dL — ABNORMAL LOW (ref 8.4–10.5)
Creatinine, Ser: 1.9 mg/dL — ABNORMAL HIGH (ref 0.50–1.10)
GFR, EST AFRICAN AMERICAN: 30 mL/min — AB (ref >90)
GFR, EST NON AFRICAN AMERICAN: 26 mL/min — AB (ref >90)
GLUCOSE: 225 mg/dL — AB (ref 70–99)
Potassium: 4.6 mmol/L (ref 3.5–5.1)
Sodium: 140 mmol/L (ref 135–145)
Total Protein: 7.7 g/dL (ref 6.0–8.3)

## 2014-12-16 LAB — SYNOVIAL CELL COUNT + DIFF, W/ CRYSTALS
Crystals, Fluid: NONE SEEN
Monocyte-Macrophage-Synovial Fluid: 5 % — ABNORMAL LOW (ref 50–90)
NEUTROPHIL, SYNOVIAL: 95 % — AB (ref 0–25)
WBC, SYNOVIAL: 239360 /mm3 — AB (ref 0–200)

## 2014-12-16 LAB — C-REACTIVE PROTEIN: CRP: 24.6 mg/dL — ABNORMAL HIGH (ref <0.60)

## 2014-12-16 LAB — GLUCOSE, CAPILLARY
GLUCOSE-CAPILLARY: 242 mg/dL — AB (ref 70–99)
GLUCOSE-CAPILLARY: 145 mg/dL — AB (ref 70–99)
GLUCOSE-CAPILLARY: 120 mg/dL — AB (ref 70–99)
Glucose-Capillary: 227 mg/dL — ABNORMAL HIGH (ref 70–99)
Glucose-Capillary: 191 mg/dL — ABNORMAL HIGH (ref 70–99)

## 2014-12-16 LAB — MRSA PCR SCREENING: MRSA by PCR: POSITIVE — AB

## 2014-12-16 LAB — PHOSPHORUS: PHOSPHORUS: 4.2 mg/dL (ref 2.3–4.6)

## 2014-12-16 LAB — MAGNESIUM: Magnesium: 1.7 mg/dL (ref 1.5–2.5)

## 2014-12-16 LAB — TSH: TSH: 1.518 u[IU]/mL (ref 0.350–4.500)

## 2014-12-16 LAB — CBG MONITORING, ED: Glucose-Capillary: 188 mg/dL — ABNORMAL HIGH (ref 70–99)

## 2014-12-16 LAB — SEDIMENTATION RATE: SED RATE: 116 mm/h — AB (ref 0–22)

## 2014-12-16 MED ORDER — ACETAMINOPHEN 650 MG RE SUPP: 650.0000 mg | Freq: Four times a day (QID) | RECTAL | Status: DC | PRN

## 2014-12-16 MED ORDER — METOPROLOL TARTRATE 50 MG PO TABS
75.0000 mg | ORAL_TABLET | Freq: Two times a day (BID) | ORAL | Status: DC
  Administered 2014-12-16 – 2014-12-19 (×9): 75 mg via ORAL
  Filled 2014-12-16 (×18): qty 1

## 2014-12-16 MED ORDER — HYDROMORPHONE HCL 1 MG/ML IJ SOLN
1.0000 mg | Freq: Once | INTRAMUSCULAR | Status: AC
  Administered 2014-12-16: 1 mg via INTRAVENOUS
  Filled 2014-12-16: qty 1

## 2014-12-16 MED ORDER — HYDROCODONE-ACETAMINOPHEN 5-325 MG PO TABS
1.0000 | ORAL_TABLET | ORAL | Status: DC | PRN
  Administered 2014-12-16 – 2014-12-22 (×7): 2 via ORAL
  Filled 2014-12-16 (×8): qty 2

## 2014-12-16 MED ORDER — POLYETHYLENE GLYCOL 3350 17 G PO PACK
17.0000 g | PACK | Freq: Every day | ORAL | Status: DC
  Administered 2014-12-16 – 2014-12-19 (×3): 17 g via ORAL
  Filled 2014-12-16 (×6): qty 1

## 2014-12-16 MED ORDER — OXYCODONE HCL 5 MG PO TABS
10.0000 mg | ORAL_TABLET | ORAL | Status: DC | PRN
  Administered 2014-12-16 – 2014-12-23 (×12): 10 mg via ORAL
  Filled 2014-12-16 (×11): qty 2

## 2014-12-16 MED ORDER — SODIUM CHLORIDE 0.9 % IV SOLN
250.0000 mL | INTRAVENOUS | Status: DC | PRN
  Administered 2014-12-18 – 2014-12-20 (×2): 250 mL via INTRAVENOUS
  Administered 2014-12-20: 13:00:00 via INTRAVENOUS

## 2014-12-16 MED ORDER — ISOSORBIDE MONONITRATE 10 MG PO TABS
10.0000 mg | ORAL_TABLET | Freq: Two times a day (BID) | ORAL | Status: DC
  Administered 2014-12-16 – 2014-12-21 (×10): 10 mg via ORAL
  Administered 2014-12-21 – 2014-12-22 (×2): 20 mg via ORAL
  Administered 2014-12-22 – 2014-12-23 (×2): 10 mg via ORAL
  Filled 2014-12-16 (×20): qty 1

## 2014-12-16 MED ORDER — HYDROMORPHONE HCL 1 MG/ML IJ SOLN
1.0000 mg | INTRAMUSCULAR | Status: DC | PRN
  Administered 2014-12-16 – 2014-12-23 (×18): 1 mg via INTRAVENOUS
  Filled 2014-12-16 (×18): qty 1

## 2014-12-16 MED ORDER — SENNA 8.6 MG PO TABS
1.0000 | ORAL_TABLET | Freq: Two times a day (BID) | ORAL | Status: DC
  Administered 2014-12-16 – 2014-12-23 (×11): 8.6 mg via ORAL
  Filled 2014-12-16 (×16): qty 1

## 2014-12-16 MED ORDER — SODIUM CHLORIDE 0.9 % IV SOLN
1500.0000 mg | INTRAVENOUS | Status: DC
  Administered 2014-12-16: 1500 mg via INTRAVENOUS
  Filled 2014-12-16 (×3): qty 1500

## 2014-12-16 MED ORDER — COLCHICINE 0.6 MG PO TABS
1.2000 mg | ORAL_TABLET | Freq: Every day | ORAL | Status: DC
  Administered 2014-12-16 – 2014-12-23 (×6): 1.2 mg via ORAL
  Filled 2014-12-16 (×8): qty 2

## 2014-12-16 MED ORDER — INSULIN ASPART 100 UNIT/ML ~~LOC~~ SOLN
0.0000 [IU] | SUBCUTANEOUS | Status: DC
  Administered 2014-12-16: 2 [IU] via SUBCUTANEOUS
  Administered 2014-12-16: 1 [IU] via SUBCUTANEOUS
  Administered 2014-12-16 (×2): 3 [IU] via SUBCUTANEOUS
  Administered 2014-12-17 (×2): 1 [IU] via SUBCUTANEOUS

## 2014-12-16 MED ORDER — GABAPENTIN 300 MG PO CAPS
300.0000 mg | ORAL_CAPSULE | Freq: Three times a day (TID) | ORAL | Status: DC
  Administered 2014-12-16 – 2014-12-23 (×19): 300 mg via ORAL
  Filled 2014-12-16 (×25): qty 1

## 2014-12-16 MED ORDER — SODIUM CHLORIDE 0.9 % IJ SOLN: 3.0000 mL | INTRAMUSCULAR | Status: DC | PRN

## 2014-12-16 MED ORDER — ACETAMINOPHEN 325 MG PO TABS
650.0000 mg | ORAL_TABLET | Freq: Four times a day (QID) | ORAL | Status: DC | PRN
  Administered 2014-12-17 – 2014-12-19 (×4): 650 mg via ORAL
  Filled 2014-12-16 (×4): qty 2

## 2014-12-16 MED ORDER — ONDANSETRON HCL 4 MG PO TABS: 4.0000 mg | ORAL_TABLET | Freq: Four times a day (QID) | ORAL | Status: DC | PRN

## 2014-12-16 MED ORDER — PANTOPRAZOLE SODIUM 40 MG PO TBEC
40.0000 mg | DELAYED_RELEASE_TABLET | Freq: Every day | ORAL | Status: DC
  Administered 2014-12-16 – 2014-12-23 (×7): 40 mg via ORAL
  Filled 2014-12-16 (×7): qty 1

## 2014-12-16 MED ORDER — SODIUM CHLORIDE 0.9 % IJ SOLN
3.0000 mL | Freq: Two times a day (BID) | INTRAMUSCULAR | Status: DC
  Administered 2014-12-16 – 2014-12-23 (×5): 3 mL via INTRAVENOUS

## 2014-12-16 MED ORDER — ONDANSETRON HCL 4 MG/2ML IJ SOLN: 4.0000 mg | Freq: Four times a day (QID) | INTRAMUSCULAR | Status: DC | PRN

## 2014-12-16 MED ORDER — SODIUM CHLORIDE 0.9 % IJ SOLN
3.0000 mL | Freq: Two times a day (BID) | INTRAMUSCULAR | Status: DC
  Administered 2014-12-16 – 2014-12-23 (×4): 3 mL via INTRAVENOUS

## 2014-12-16 NOTE — Progress Notes (Signed)
ANTIBIOTIC CONSULT NOTE - follow up  Pharmacy Consult for Vancomycin, Flagyl, Aztreonam Indication: Cellulitis  Allergies  Allergen Reactions  . Bactrim [Sulfamethoxazole-Trimethoprim] Other (See Comments)    Hyperkalemia (July 2015)  . Penicillins Hives    Has taken cephalexin 06/2014    Patient Measurements: Weight: (!) 308 lb (139.708 kg) Adjusted Body Weight:   Vital Signs: Temp: 98.2 F (36.8 C) (04/21 1006) Temp Source: Oral (04/21 1006) BP: 154/68 mmHg (04/21 1006) Pulse Rate: 84 (04/21 1006) Intake/Output from previous day: 04/20 0701 - 04/21 0700 In: -  Out: 1800 [Urine:1800] Intake/Output from this shift:    Labs:  Recent Labs  12/14/14 1045 12/15/14 1910 12/16/14 0558  WBC  --  15.2* 13.0*  HGB 10.8* 12.1 11.1*  PLT  --  231 207  CREATININE  --  2.18* 1.90*   Estimated Creatinine Clearance: 41.5 mL/min (by C-G formula based on Cr of 1.9). No results for input(s): VANCOTROUGH, VANCOPEAK, VANCORANDOM, GENTTROUGH, GENTPEAK, GENTRANDOM, TOBRATROUGH, TOBRAPEAK, TOBRARND, AMIKACINPEAK, AMIKACINTROU, AMIKACIN in the last 72 hours.   Microbiology: Recent Results (from the past 720 hour(s))  MRSA PCR Screening     Status: Abnormal   Collection Time: 11/23/14 11:47 AM  Result Value Ref Range Status   MRSA by PCR POSITIVE (A) NEGATIVE Final    Comment:        The GeneXpert MRSA Assay (FDA approved for NASAL specimens only), is one component of a comprehensive MRSA colonization surveillance program. It is not intended to diagnose MRSA infection nor to guide or monitor treatment for MRSA infections. RESULT CALLED TO, READ BACK BY AND VERIFIED WITH: T.NICHOLS,RN AT 0055 ON 11/24/14 BY W.SHEA   MRSA PCR Screening     Status: Abnormal   Collection Time: 12/16/14  3:37 AM  Result Value Ref Range Status   MRSA by PCR POSITIVE (A) NEGATIVE Final    Comment:        The GeneXpert MRSA Assay (FDA approved for NASAL specimens only), is one component of  a comprehensive MRSA colonization surveillance program. It is not intended to diagnose MRSA infection nor to guide or monitor treatment for MRSA infections. RESULT CALLED TO, READ BACK BY AND VERIFIED WITH: C. YOUNG RN AT [phone] ON 04.21.16 BY SHUEA     Medical History: Past Medical History  Diagnosis Date  . Hypertension   . Hyperlipidemia   . CHF (congestive heart failure)   . GERD (gastroesophageal reflux disease)   . Depression   . Lymphedema of leg     left leg  . Pulmonary embolism 02/18/14    diagnosed at [facility] with CT angio chest  . Chronic indwelling Foley catheter   . Heart murmur     "just found out I have one" (04/07/2014)  . DVT (deep venous thrombosis) 01/2014    LLE; "went from my leg to my lungs"  . Pneumonia 1990's X 2  . Sleep apnea     doesn't use cpap machine or 02 at night (04/07/2014)  . Anemia   . History of blood transfusion 1990's X 2    "when they did my surgeries"  . Migraines     "none for years now" (04/07/2014)  . Arthritis     "arms, legs" (04/07/2014)  . Gout   . Urinary incontinence   . Chronic kidney disease (CKD), stage III (moderate)     [name] 04/07/2014  . Neuropathy   . Diabetes mellitus     "at one time" (04/07/2014)    Assessment:  74 yoF with complicated PMHx including recurrent cellulitis, diabetic neuropathy, DVTs, PE, CHF (EF 55-60% 3/30/'16), and CKD-III, presents with leg pain, warm, redness, and fever.  Pharmacy consulted to start vancomycin, flagyl, and aztreonam for sepsis 2/2 cellulitis.  Allergies noted to bactrim and penicillins. First doses ordered in ED.   4/20 >> Vancomycin  >> 4/20 >> Flagyl  >>   4/20 >> Aztreonam >>  Today 4/21: Scr 1.9, CrCl ~51mls/min (N)  Drug levels: Prior Vanc trough 18.9 (07/18/14) on Vanc 1500mg  q24h, SCr 1.2-1.5  Goal of Therapy:  Vancomycin trough 10-15 mcg/ml  Eradication of infection  Plan:  Change vanc dose to 1500mg  IV q24h Continue Flagyl 500mg  IV q8h Continue Aztreonam 1g  IV q8h F/u renal fxn, cultures, vanc trough as needed  Dolly Rias RPh 12/16/2014, 10:51 AM Pager [phone]

## 2014-12-16 NOTE — ED Notes (Signed)
Dr Mariann Barter in evaluating patient

## 2014-12-16 NOTE — Progress Notes (Signed)
[facility]   Assisting Dr. Ronnie Derby with management of pt  [facility]  Pt is posted for I&D of L knee tomorrow am at [facility] to eat today NPO after mn  Await Cell count and diff  Jari Pigg, PA-C [facility] [phone] (P) 12/16/2014 1:37 PM

## 2014-12-16 NOTE — ED Notes (Signed)
Pt taken to xray 

## 2014-12-16 NOTE — Consult Note (Signed)
WOC wound consult note Reason for Consult: right posterior thigh wound.  Pt is well known to the [place] team and to this [place] nurse from her previous admissions. I met with this patient and we discussed her current care at home. She has non healing ulcer on the right posterior thigh that she has had for many years.   Wound type: Stage III Pressure ulcer with continual sheer injury when she gets up and down out of chair and bed.  Pressure Ulcer POA: Yes Measurement: pt is experiencing intense pain at the time of my visit to day and has not let nursing staff assess this either. She is refusing to turn at this time but I have stressed dressing must be changed today, she is in agreement to turn once her pain is under better control.  Will ask bedside nursing to obtain measurement when they apply new dressing.   Dressing procedure/placement/frequency: Silver hydrofiber for recalcitrant wound, cover with foam. Change silver hydrofiber daily.  Pressure redistribution mattress in place at the time of my consult today.   Discussed POC with patient and bedside nurse.  Re consult if needed, will not follow at this time. Thanks  Lorah Kalina Kellogg, [place] [phone])

## 2014-12-16 NOTE — Progress Notes (Signed)
Inpatient Diabetes Program Recommendations  AACE/ADA: New Consensus Statement on Inpatient Glycemic Control (2013)  Target Ranges:  Prepandial:   less than 140 mg/dL      Peak postprandial:   less than 180 mg/dL (1-2 hours)      Critically ill patients:  140 - 180 mg/dL   Reason for Visit: Hyperglycemia  Diabetes history: DM2 Outpatient Diabetes medications: None Current orders for Inpatient glycemic control: Novolog sensitive Q4H.  Results for CHENA, CHOHAN (MRN [id number]) as of 12/16/2014 10:17  Ref. Range 12/16/2014 01:33 12/16/2014 05:54 12/16/2014 07:38  Glucose-Capillary Latest Ref Range: 70-99 mg/dL 188 (H) 242 (H) 227 (H)  Results for CHAYIL, GANTT (MRN [id number]) as of 12/16/2014 10:17  Ref. Range 12/15/2014 19:10 12/16/2014 05:58  Glucose Latest Ref Range: 70-99 mg/dL 214 (H) 225 (H)   FBS > 200 mg/dL x 2 days. Would likely benefit from small amount of basal insulin. HgbA1C pending.  Recommendations: Add Levemir 15 units QHS Increase Novolog to moderate Q4H. When eating CHO mod diet, tidwc and hs.  Will continue to follow. Thank you. Lorenda Peck, RD, LDN, CDE Inpatient Diabetes Coordinator [phone]

## 2014-12-16 NOTE — Progress Notes (Unsigned)
Patient ID: FRANCHON KETTERMAN, female   DOB: [birth year], 69 y.o.   MRN: [id number] NAME: Kelly [name] MRN:   [id number] DOB:   May 15, 1946   CHIEF COMPLAINT:  ***  HISTORY:   Kelly [name] a 69 y.o. female  with left  Knee Pain Patient presents with knee pain involving the right knee. Onset of the symptoms was several days ago. Inciting event: none known. Current symptoms include pain located all around. Pain is aggravated by any weight bearing. Patient has had no prior knee problems. Evaluation to date: none. Treatment to date: none.     PAST MEDICAL HISTORY:   Past Medical History  Diagnosis Date  . Hypertension   . Hyperlipidemia   . CHF (congestive heart failure)   . GERD (gastroesophageal reflux disease)   . Depression   . Lymphedema of leg     left leg  . Pulmonary embolism 02/18/14    diagnosed at [facility] with [facility] angio chest  . Chronic indwelling Foley catheter   . Heart murmur     "just found out I have one" (04/07/2014)  . DVT (deep venous thrombosis) 01/2014    LLE; "went from my leg to my lungs"  . Pneumonia 1990's X 2  . Sleep apnea     doesn't use cpap machine or 02 at night (04/07/2014)  . Anemia   . History of blood transfusion 1990's X 2    "when they did my surgeries"  . Migraines     "none for years now" (04/07/2014)  . Arthritis     "arms, legs" (04/07/2014)  . Gout   . Urinary incontinence   . Chronic kidney disease (CKD), stage III (moderate)     [name] 04/07/2014  . Neuropathy   . Diabetes mellitus     "at one time" (04/07/2014)    PAST SURGICAL HISTORY:   Past Surgical History  Procedure Laterality Date  . Shoulder open rotator cuff repair Right ~ 2000  . Knee arthroscopy Right ?1980's  . Temporal artery biopsy / ligation Right ?1990's  . Colonoscopy with propofol  09/23/2012    Procedure: COLONOSCOPY WITH PROPOFOL;  Surgeon: Jerene Bears, MD;  Location: [facility];  Service: Gastroenterology;  Laterality: N/A;  . Panniculectomy Left 02/08/2014   w/wound debridement @ medial thigh  . Irrigation and debridement abscess Left 02/24/2014    Procedure: MINOR INCISION AND DRAINAGE OF ABSCESS;  Surgeon: Theodoro Kos, DO;  Location: [facility];  Service: Plastics;  Laterality: Left;  . I&d extremity Left 03/03/2014    Procedure: IRRIGATION AND DEBRIDEMENT LEFT LEG WOUND AND PLACEMENT OF A CELL AND VAC;  Surgeon: Theodoro Kos, DO;  Location: [place];  Service: Plastics;  Laterality: Left;  . Incision and drainage of wound Left 03/16/2014    Procedure: IRRIGATION AND DEBRIDEMENT OF LEFT THIGH WOUND, APPLICATION ACELL;  Surgeon: Irene Limbo, MD;  Location: [place];  Service: Plastics;  Laterality: Left;  . Incision and drainage of wound Left 03/25/2014    Procedure: IRRIGATION AND DEBRIDEMENT OF LEFT LEG WOUND WITH PLACEMENT OF ACELL/VAC;  Surgeon: Theodoro Kos, DO;  Location: [place];  Service: Plastics;  Laterality: Left;  . Incision and drainage of wound Left 03/31/2014    Procedure: IRRIGATION AND DEBRIDEMENT LEFT LEG WOUND WITH PLACEMENT OF A-CELL/VAC;  Surgeon: Theodoro Kos, DO;  Location: [place];  Service: Plastics;  Laterality: Left;  . Cataract extraction w/ intraocular lens implant Right 2014  . Vena cava filter placement  02/2014  .  Incision and drainage of wound Left 04/07/2014    Procedure: IRRIGATION AND DEBRIDEMENT LEFT LEG WOUND WITH PLACEMENT OF A CELL AND VAC;  Surgeon: Theodoro Kos, DO;  Location: [place];  Service: Plastics;  Laterality: Left;  . Incision and drainage of wound Left 04/14/2014    Procedure: IRRIGATION AND DEBRIDEMENT WOUND;  Surgeon: Theodoro Kos, DO;  Location: [place];  Service: Plastics;  Laterality: Left;  . Application of a-cell of extremity Left 04/14/2014    Procedure: APPLICATION OF A-CELL OF EXTREMITY;  Surgeon: Theodoro Kos, DO;  Location: [place];  Service: Plastics;  Laterality: Left;  Marland Kitchen Minor application of wound vac Left 04/14/2014    Procedure: MINOR APPLICATION OF WOUND VAC;  Surgeon: Theodoro Kos, DO;  Location:  [place];  Service: Plastics;  Laterality: Left;  . Incision and drainage of wound Left 04/21/2014    Procedure: IRRIGATION AND DEBRIDEMENT OF LEFT UPPER LEG WOUND WITH PLACEMENT OF ACELL/VAC;  Surgeon: Theodoro Kos, DO;  Location: [place];  Service: Plastics;  Laterality: Left;    MEDICATIONS:   (Not in a hospital admission)  ALLERGIES:   Allergies  Allergen Reactions  . Bactrim [Sulfamethoxazole-Trimethoprim] Other (See Comments)    Hyperkalemia (July 2015)  . Penicillins Hives    Has taken cephalexin 06/2014    REVIEW OF SYSTEMS:   History obtained from the patient  FAMILY HISTORY:   Family History  Problem Relation Age of Onset  . Emphysema Father   . Asthma Brother   . Heart disease Mother   . Stomach cancer Brother   . Heart disease Maternal Grandmother   . Diabetes Mother   . Diabetes Sister     SOCIAL HISTORY:   reports that she quit smoking about 33 years ago. Her smoking use included Cigarettes. She has a 33 pack-year smoking history. She has never used smokeless tobacco. She reports that she drinks alcohol. She reports that she does not use illicit drugs.  PHYSICAL EXAM:  General appearance: alert, cooperative and appears stated age    LABORATORY STUDIES:  Recent Labs  12/15/14 1910 12/16/14 0558  WBC 15.2* 13.0*  HGB 12.1 11.1*  HCT 38.1 35.3*  PLT 231 207     Recent Labs  12/15/14 1910 12/16/14 0558  NA 137 140  K 4.5 4.6  CL 102 105  CO2 24 25  GLUCOSE 214* 225*  BUN 40* 37*  CREATININE 2.18* 1.90*  CALCIUM 8.4 8.3*    STUDIES/RESULTS:  Dg Chest 2 View  11/23/2014   CLINICAL DATA:  Chest pain.  Shortness of breath.  EXAM: CHEST  2 VIEW  COMPARISON:  07/11/2014 and 03/03/2014  FINDINGS: There is cardiomegaly with increased pulmonary vascular congestion. No infiltrates or effusions.No acute osseous abnormality.  IMPRESSION: Cardiomegaly. Increased pulmonary vascular congestion since the prior exam appear   Electronically Signed   By: Lorriane Shire M.D.   On: 11/23/2014 13:41   Nm Pulmonary Perf And Vent  11/23/2014   CLINICAL DATA:  Chest pain and shortness of breath.  EXAM: NUCLEAR MEDICINE VENTILATION - PERFUSION LUNG SCAN  TECHNIQUE: Ventilation images were obtained in multiple projections using inhaled aerosol technetium 99 M DTPA. Perfusion images were obtained in multiple projections after intravenous injection of Tc-80m MAA.  RADIOPHARMACEUTICALS:  39.3 mCi Tc-80m DTPA aerosol and 5.4 mCi Tc-16m MAA  COMPARISON:  Chest x-ray dated 11/22/2012 and CT angiogram of the chest dated 09/21/2011 and nuclear medicine lung scan dated 05/16/2010  FINDINGS: Ventilation: No focal ventilation defect.  Perfusion: No wedge shaped peripheral perfusion defects to suggest acute pulmonary embolism.  IMPRESSION: Normal ventilation-perfusion lung scan. No evidence suggestive of pulmonary embolism.   Electronically Signed   By: Lorriane Shire M.D.   On: 11/23/2014 17:35   Dg Chest Portable 1 View  12/15/2014   CLINICAL DATA:  Cellulitis and hypertension  EXAM: PORTABLE CHEST - 1 VIEW  COMPARISON:  11/23/2014  FINDINGS: There is chronic cardiomegaly and chronic fullness of the hila which is likely vascular. There is pulmonary venous congestion and cephalization without overt edema. No convincing pneumonia or pleural effusion.  IMPRESSION: Cardiomegaly and pulmonary venous congestion.   Electronically Signed   By: Monte Fantasia M.D.   On: 12/15/2014 22:25   Dg Knee Complete 4 Views Left  12/16/2014   CLINICAL DATA:  Cellulitis.  Fever.  Pain.  EXAM: LEFT KNEE - COMPLETE 4+ VIEW  COMPARISON:  None.  FINDINGS: There is advanced tricompartmental osteoarthritis with joint space loss and marginal osteophytes. There are intra-articular loose bodies. There is a joint effusion. There is regional arterial calcification. No visible fracture. No sign of osteomyelitis.  IMPRESSION: Advanced tricompartmental osteoarthritis. Joint effusion with intra-articular loose  bodies.   Electronically Signed   By: Nelson Chimes M.D.   On: 12/16/2014 01:17   Dg Tibia/fibula Left Port  12/15/2014   CLINICAL DATA:  Left leg cellulitis, left lower leg pain.  EXAM: PORTABLE LEFT TIBIA AND FIBULA - 2 VIEW  COMPARISON:  05/09/2014 CT  FINDINGS: Osteopenia. Degenerative changes about the knee, incompletely assessed. Atherosclerotic vascular calcifications. No displaced fracture or aggressive osseous lesion. Diffuse soft tissue swelling. Posterior calcaneal enthesophyte.  IMPRESSION: Osteopenia and advanced atherosclerotic disease. Soft tissue swelling. No acute or aggressive osseous finding of the left tibia/fibula.   Electronically Signed   By: Carlos Levering M.D.   On: 12/15/2014 22:27    ASSESSMENT: Left septic knee   PLAN: I aspirated the knee at the bedside and sent the synovial fluid to the lab for cell count with differential, crystal analysis, gram stain, C & S.  I feel it is infected and needs to be washed out.  I am going out of town today and contacted Dr. Helane Gunther to take over the orthopaedic care of the patient.    Elmond Poehlman,STEPHEN D 12/16/2014. 4:28 PM

## 2014-12-16 NOTE — Progress Notes (Signed)
PROGRESS NOTE  Kelly [name] IHK:[id number] DOB: 1946/05/16 DOA: 12/15/2014 PCP: Alvester Chou, NP  HPI: 69 yo F with morbid obesity, diastolic CHF, lymphedema and recurrent cellulitis admitted overnight with left knee pain, fever, tachycardia and leukocytosis.   Subjective / 24 H Interval events - continues to have severe pain this morning, can barely move her left knee - denies chest pain, abdominal pain, nausea or vomiting  Assessment/Plan: Active Problems:   Morbid obesity   Chronic diastolic heart failure   Chronic venous insufficiency   Chronic kidney disease, stage 3   DM type 2 causing CKD stage 3   Coronary atherosclerosis of native coronary artery   Sepsis   Cellulitis  Sepsis with concern for septic knee  - Dr. Ronnie Derby consulted overnight by EDP and initial considerations were given to see how she responds to antibiotics. Ongoing knee pain today, I have re-consulted orthopedic surgery.  - I have discussed with Dr. Ronnie Derby following arthrocentesis, aspirate looks worrisome for infection, will send culture, cell count, gram stain and crystals. Patient will likely need washout in the OR potentially later today or tomorrow morning, per ortho will transfer to [facility] for [facility] time, discussed with Dr. Grandville Silos [facility] MD about transfer - continue broad spectrum antibiotics for now - she is now afebrile, heart rate improved  CKD stage III  - baseline Cr 1.5-1.6, Cr today 1.9, close to baseline, #1 contributing - monitor  ?Cellulitis  - very small area above knee, difficult exam due to chronic lymphedema, no warmth, no tenderness - antibiotics as above  Morbid obesity  Chronic venous insufficiency   Chronic diastolic heart failure  - closely monitor fluid status  DM2  - SSI  Pressure ulcer right posterior thigh  - present on admission  CAD  - no chest pain   Diet: Diet NPO time specified Fluids: NS DVT Prophylaxis: SCD  Code Status: Full Code Family  Communication: d/w niece bedside  Disposition Plan: remain inpatient, transfer to [facility] today   Consultants:  Orthopedic surgery   Procedures:  Left knee arthrocentesis    Antibiotics Vancomycin 4/20 >> Aztreonam 4/20 >> Metronidazole 4/20 >>   Studies  Dg Chest Portable 1 View  12/15/2014   CLINICAL DATA:  Cellulitis and hypertension  EXAM: PORTABLE CHEST - 1 VIEW  COMPARISON:  11/23/2014  FINDINGS: There is chronic cardiomegaly and chronic fullness of the hila which is likely vascular. There is pulmonary venous congestion and cephalization without overt edema. No convincing pneumonia or pleural effusion.  IMPRESSION: Cardiomegaly and pulmonary venous congestion.   Electronically Signed   By: Monte Fantasia M.D.   On: 12/15/2014 22:25   Dg Knee Complete 4 Views Left  12/16/2014   CLINICAL DATA:  Cellulitis.  Fever.  Pain.  EXAM: LEFT KNEE - COMPLETE 4+ VIEW  COMPARISON:  None.  FINDINGS: There is advanced tricompartmental osteoarthritis with joint space loss and marginal osteophytes. There are intra-articular loose bodies. There is a joint effusion. There is regional arterial calcification. No visible fracture. No sign of osteomyelitis.  IMPRESSION: Advanced tricompartmental osteoarthritis. Joint effusion with intra-articular loose bodies.   Electronically Signed   By: Nelson Chimes M.D.   On: 12/16/2014 01:17   Dg Tibia/fibula Left Port  12/15/2014   CLINICAL DATA:  Left leg cellulitis, left lower leg pain.  EXAM: PORTABLE LEFT TIBIA AND FIBULA - 2 VIEW  COMPARISON:  05/09/2014 CT  FINDINGS: Osteopenia. Degenerative changes about the knee, incompletely assessed. Atherosclerotic vascular calcifications. No displaced  fracture or aggressive osseous lesion. Diffuse soft tissue swelling. Posterior calcaneal enthesophyte.  IMPRESSION: Osteopenia and advanced atherosclerotic disease. Soft tissue swelling. No acute or aggressive osseous finding of the left tibia/fibula.   Electronically Signed    By: Carlos Levering M.D.   On: 12/15/2014 22:27    Objective  Filed Vitals:   12/16/14 0220 12/16/14 0700 12/16/14 0735 12/16/14 1006  BP: 167/94 155/65 147/56 154/68  Pulse: 100 79 84 84  Temp: 99.7 F (37.6 C) 99.1 F (37.3 C) 98.6 F (37 C) 98.2 F (36.8 C)  TempSrc: Oral Oral Oral Oral  Resp: 18 18 20 18   Weight:      SpO2: 97% 98% 99% 99%    Intake/Output Summary (Last 24 hours) at 12/16/14 1308 Last data filed at 12/16/14 1216  Gross per 24 hour  Intake    483 ml  Output   2100 ml  Net  -1617 ml   Filed Weights   12/15/14 1918  Weight: 139.708 kg (308 lb)    Exam:  General:  In distress with every movement of her left knee, crying in pain   HEENT: no scleral icterus, PERRL  Cardiovascular: RRR without MRG, 2+ peripheral pulses  Respiratory: CTA biL, good air movement, no wheezing, no crackles, no rales, difficult exam due to morbid obesity   Abdomen: soft, non tender, BS +, no guarding, difficult exam due to morbid obesity   MSK/Extremities: no clubbing/cyanosis, left knee swelling, warmth, very tender to palpation, borderline cellulitis changes mid thigh, unable to passively mobilize the left knee due to excruciating pain  Skin: no rashes, known pressure ulcer right thigh posteriorly, unable to evaluate due to inability to turn patient over due to knee pain  Neuro: non focal, strength 5/5 in all 4   Data Reviewed: Basic Metabolic Panel:  Recent Labs Lab 12/15/14 1910 12/16/14 0558  NA 137 140  K 4.5 4.6  CL 102 105  CO2 24 25  GLUCOSE 214* 225*  BUN 40* 37*  CREATININE 2.18* 1.90*  CALCIUM 8.4 8.3*  MG  --  1.7  PHOS  --  4.2   Liver Function Tests:  Recent Labs Lab 12/15/14 1910 12/16/14 0558  AST 18 18  ALT 12 11  ALKPHOS 97 90  BILITOT 0.4 0.6  PROT 8.7* 7.7  ALBUMIN 2.9* 2.4*   CBC:  Recent Labs Lab 12/14/14 1045 12/15/14 1910 12/16/14 0558  WBC  --  15.2* 13.0*  NEUTROABS  --  13.3*  --   HGB 10.8* 12.1 11.1*  HCT   --  38.1 35.3*  MCV  --  88.8 88.0  PLT  --  231 207   BNP (last 3 results)  Recent Labs  09/03/14 0407 11/23/14 1306  BNP 507.4* 194.0*    ProBNP (last 3 results)  Recent Labs  07/11/14 1959  PROBNP 2482.0*    CBG:  Recent Labs Lab 12/16/14 0133 12/16/14 0554 12/16/14 0738 12/16/14 1206  GLUCAP 188* 242* 227* 191*    Recent Results (from the past 240 hour(s))  MRSA PCR Screening     Status: Abnormal   Collection Time: 12/16/14  3:37 AM  Result Value Ref Range Status   MRSA by PCR POSITIVE (A) NEGATIVE Final    Comment:        The GeneXpert MRSA Assay (FDA approved for NASAL specimens only), is one component of a comprehensive MRSA colonization surveillance program. It is not intended to diagnose MRSA infection nor to guide or monitor  treatment for MRSA infections. RESULT CALLED TO, READ BACK BY AND VERIFIED WITH: C. YOUNG RN AT 0615 ON 04.21.16 BY SHUEA      Scheduled Meds: . aztreonam  1 g Intravenous 3 times per day  . colchicine  1.2 mg Oral Daily  . gabapentin  300 mg Oral TID  . insulin aspart  0-9 Units Subcutaneous 6 times per day  . isosorbide mononitrate  10 mg Oral BID  . metoprolol tartrate  75 mg Oral BID  . metronidazole  500 mg Intravenous Q8H  . pantoprazole  40 mg Oral Daily  . polyethylene glycol  17 g Oral Daily  . senna  1 tablet Oral BID  . sodium chloride  3 mL Intravenous Q12H  . sodium chloride  3 mL Intravenous Q12H  . vancomycin  1,500 mg Intravenous Q24H   Continuous Infusions:   Time spent: 35 minutes in patient evaluation / discussions, direct discussions with orthopedic surgery and arranging hospital to hospital transfer.   Marzetta Board, MD [facility] Pager [phone]. If 7 PM - 7 AM, please contact night-coverage at [email or web address], password [facility] 12/16/2014, 1:08 PM  LOS: 1 day

## 2014-12-17 ENCOUNTER — Inpatient Hospital Stay (HOSPITAL_COMMUNITY): Payer: Medicare Other | Admitting: Critical Care Medicine

## 2014-12-17 ENCOUNTER — Encounter (HOSPITAL_COMMUNITY)
Admission: EM | Disposition: A | Discharge status: Skilled Nursing Facility | Payer: Self-pay | Source: Home / Self Care | Attending: Internal Medicine

## 2014-12-17 ENCOUNTER — Encounter (HOSPITAL_COMMUNITY): Payer: Self-pay | Admitting: Critical Care Medicine

## 2014-12-17 HISTORY — PX: IRRIGATION AND DEBRIDEMENT KNEE: SHX5185

## 2014-12-17 HISTORY — PX: KNEE ARTHROSCOPY: SHX127

## 2014-12-17 LAB — GRAM STAIN

## 2014-12-17 LAB — GLUCOSE, CAPILLARY
GLUCOSE-CAPILLARY: 134 mg/dL — AB (ref 70–99)
GLUCOSE-CAPILLARY: 146 mg/dL — AB (ref 70–99)
Glucose-Capillary: 140 mg/dL — ABNORMAL HIGH (ref 70–99)
Glucose-Capillary: 133 mg/dL — ABNORMAL HIGH (ref 70–99)
Glucose-Capillary: 119 mg/dL — ABNORMAL HIGH (ref 70–99)
Glucose-Capillary: 87 mg/dL (ref 70–99)

## 2014-12-17 LAB — URINE CULTURE
COLONY COUNT: NO GROWTH
Culture: NO GROWTH

## 2014-12-17 LAB — HEMOGLOBIN A1C
HEMOGLOBIN A1C: 6.8 % — AB (ref 4.8–5.6)
Mean Plasma Glucose: 148 mg/dL

## 2014-12-17 SURGERY — ARTHROSCOPY, KNEE
Anesthesia: General | Site: Knee | Laterality: Left

## 2014-12-17 MED ORDER — OXYCODONE HCL 5 MG PO TABS
ORAL_TABLET | ORAL | Status: AC
  Administered 2014-12-17: 10 mg via ORAL
  Filled 2014-12-17: qty 3

## 2014-12-17 MED ORDER — PROPOFOL 10 MG/ML IV BOLUS
INTRAVENOUS | Status: DC | PRN
  Administered 2014-12-17: 150 mg via INTRAVENOUS

## 2014-12-17 MED ORDER — INSULIN ASPART 100 UNIT/ML ~~LOC~~ SOLN: 0.0000 [IU] | Freq: Three times a day (TID) | SUBCUTANEOUS | Status: DC

## 2014-12-17 MED ORDER — HYDROMORPHONE HCL 1 MG/ML IJ SOLN
INTRAMUSCULAR | Status: AC
  Filled 2014-12-17: qty 2

## 2014-12-17 MED ORDER — LIDOCAINE HCL (CARDIAC) 10 MG/ML IV SOLN
INTRAVENOUS | Status: DC | PRN
  Administered 2014-12-17: 70 mg via INTRAVENOUS

## 2014-12-17 MED ORDER — OXYCODONE HCL 5 MG/5ML PO SOLN: 5.0000 mg | Freq: Once | ORAL | Status: AC | PRN

## 2014-12-17 MED ORDER — SODIUM CHLORIDE 0.9 % IJ SOLN
INTRAMUSCULAR | Status: AC
  Filled 2014-12-17: qty 10

## 2014-12-17 MED ORDER — EPHEDRINE SULFATE 50 MG/ML IJ SOLN
INTRAMUSCULAR | Status: AC
  Filled 2014-12-17: qty 1

## 2014-12-17 MED ORDER — FENTANYL CITRATE (PF) 100 MCG/2ML IJ SOLN
INTRAMUSCULAR | Status: DC | PRN
  Administered 2014-12-17 (×5): 25 ug via INTRAVENOUS
  Administered 2014-12-17: 50 ug via INTRAVENOUS
  Administered 2014-12-17 (×3): 25 ug via INTRAVENOUS

## 2014-12-17 MED ORDER — ONDANSETRON HCL 4 MG/2ML IJ SOLN
INTRAMUSCULAR | Status: DC | PRN
  Administered 2014-12-17: 4 mg via INTRAVENOUS

## 2014-12-17 MED ORDER — VANCOMYCIN HCL 10 G IV SOLR
1750.0000 mg | INTRAVENOUS | Status: DC
  Administered 2014-12-17 – 2014-12-18 (×2): 1750 mg via INTRAVENOUS
  Filled 2014-12-17 (×2): qty 1750

## 2014-12-17 MED ORDER — BUPIVACAINE-EPINEPHRINE (PF) 0.25% -1:200000 IJ SOLN
INTRAMUSCULAR | Status: AC
  Filled 2014-12-17: qty 30

## 2014-12-17 MED ORDER — PROPOFOL 10 MG/ML IV BOLUS
INTRAVENOUS | Status: AC
  Filled 2014-12-17: qty 20

## 2014-12-17 MED ORDER — HYDROMORPHONE HCL 1 MG/ML IJ SOLN
INTRAMUSCULAR | Status: AC
Start: 2014-12-17 — End: 2014-12-17
  Filled 2014-12-17: qty 1

## 2014-12-17 MED ORDER — MIDAZOLAM HCL 5 MG/5ML IJ SOLN
INTRAMUSCULAR | Status: DC | PRN
  Administered 2014-12-17: 2 mg via INTRAVENOUS

## 2014-12-17 MED ORDER — PROMETHAZINE HCL 25 MG/ML IJ SOLN
6.2500 mg | INTRAMUSCULAR | Status: DC | PRN
  Administered 2014-12-17: 12.5 mg via INTRAVENOUS

## 2014-12-17 MED ORDER — SODIUM CHLORIDE 0.9 % IR SOLN
Status: DC | PRN
  Administered 2014-12-17 (×3): 3000 mL

## 2014-12-17 MED ORDER — HYDROMORPHONE HCL 1 MG/ML IJ SOLN
INTRAMUSCULAR | Status: AC
  Filled 2014-12-17: qty 1

## 2014-12-17 MED ORDER — LIDOCAINE HCL (CARDIAC) 20 MG/ML IV SOLN
INTRAVENOUS | Status: AC
  Filled 2014-12-17: qty 5

## 2014-12-17 MED ORDER — ROCURONIUM BROMIDE 50 MG/5ML IV SOLN
INTRAVENOUS | Status: AC
  Filled 2014-12-17: qty 1

## 2014-12-17 MED ORDER — MORPHINE SULFATE 4 MG/ML IJ SOLN
INTRAMUSCULAR | Status: AC
  Filled 2014-12-17: qty 1

## 2014-12-17 MED ORDER — HYDROMORPHONE HCL 1 MG/ML IJ SOLN: 0.2500 mg | INTRAMUSCULAR | Status: DC | PRN

## 2014-12-17 MED ORDER — HYDROMORPHONE HCL 1 MG/ML IJ SOLN
0.2500 mg | INTRAMUSCULAR | Status: DC | PRN
  Administered 2014-12-17 (×6): 0.5 mg via INTRAVENOUS

## 2014-12-17 MED ORDER — PROMETHAZINE HCL 25 MG/ML IJ SOLN
INTRAMUSCULAR | Status: AC
  Filled 2014-12-17: qty 1

## 2014-12-17 MED ORDER — INSULIN ASPART 100 UNIT/ML ~~LOC~~ SOLN: 0.0000 [IU] | Freq: Every day | SUBCUTANEOUS | Status: DC

## 2014-12-17 MED ORDER — SUCCINYLCHOLINE CHLORIDE 20 MG/ML IJ SOLN
INTRAMUSCULAR | Status: AC
  Filled 2014-12-17: qty 1

## 2014-12-17 MED ORDER — MIDAZOLAM HCL 2 MG/2ML IJ SOLN
INTRAMUSCULAR | Status: AC
  Filled 2014-12-17: qty 2

## 2014-12-17 MED ORDER — 0.9 % SODIUM CHLORIDE (POUR BTL) OPTIME
TOPICAL | Status: DC | PRN
  Administered 2014-12-17: 1000 mL

## 2014-12-17 MED ORDER — OXYCODONE HCL 5 MG PO TABS
5.0000 mg | ORAL_TABLET | Freq: Once | ORAL | Status: AC | PRN
  Administered 2014-12-17: 5 mg via ORAL

## 2014-12-17 MED ORDER — BUPIVACAINE-EPINEPHRINE 0.25% -1:200000 IJ SOLN
INTRAMUSCULAR | Status: DC | PRN
  Administered 2014-12-17: 30 mL

## 2014-12-17 MED ORDER — LACTATED RINGERS IV SOLN
INTRAVENOUS | Status: DC | PRN
  Administered 2014-12-17: 08:00:00 via INTRAVENOUS

## 2014-12-17 MED ORDER — FENTANYL CITRATE (PF) 250 MCG/5ML IJ SOLN
INTRAMUSCULAR | Status: AC
  Filled 2014-12-17: qty 5

## 2014-12-17 SURGICAL SUPPLY — 45 items
BANDAGE ELASTIC 6 VELCRO ST LF (GAUZE/BANDAGES/DRESSINGS) ×4 IMPLANT
BANDAGE ESMARK 6X9 LF (GAUZE/BANDAGES/DRESSINGS) IMPLANT
BLADE CUDA 5.5 (BLADE) IMPLANT
BLADE GREAT WHITE 4.2 (BLADE) ×2 IMPLANT
BLADE GREAT WHITE 4.2MM (BLADE) ×1
BLADE SURG 11 STRL SS (BLADE) ×3 IMPLANT
BNDG CMPR 9X6 STRL LF SNTH (GAUZE/BANDAGES/DRESSINGS)
BNDG ESMARK 6X9 LF (GAUZE/BANDAGES/DRESSINGS)
BRUSH SCRUB DISP (MISCELLANEOUS) ×6 IMPLANT
COVER SURGICAL LIGHT HANDLE (MISCELLANEOUS) ×2 IMPLANT
CUFF TOURNIQUET SINGLE 34IN LL (TOURNIQUET CUFF) IMPLANT
CUFF TOURNIQUET SINGLE 44IN (TOURNIQUET CUFF) IMPLANT
DRAPE ARTHROSCOPY W/POUCH 114 (DRAPES) ×3 IMPLANT
DRAPE U-SHAPE 47X51 STRL (DRAPES) ×3 IMPLANT
DRSG EMULSION OIL 3X3 NADH (GAUZE/BANDAGES/DRESSINGS) ×3 IMPLANT
DRSG PAD ABDOMINAL 8X10 ST (GAUZE/BANDAGES/DRESSINGS) ×4 IMPLANT
GAUZE SPONGE 4X4 12PLY STRL (GAUZE/BANDAGES/DRESSINGS) ×3 IMPLANT
GAUZE SPONGE 4X4 16PLY XRAY LF (GAUZE/BANDAGES/DRESSINGS) ×1 IMPLANT
GLOVE BIO SURGEON STRL SZ7.5 (GLOVE) ×3 IMPLANT
GLOVE BIO SURGEON STRL SZ8 (GLOVE) ×3 IMPLANT
GLOVE BIOGEL PI IND STRL 7.5 (GLOVE) ×1 IMPLANT
GLOVE BIOGEL PI IND STRL 8 (GLOVE) ×1 IMPLANT
GLOVE BIOGEL PI INDICATOR 7.5 (GLOVE) ×2
GLOVE BIOGEL PI INDICATOR 8 (GLOVE) ×2
GOWN STRL REUS W/ TWL LRG LVL3 (GOWN DISPOSABLE) ×2 IMPLANT
GOWN STRL REUS W/ TWL XL LVL3 (GOWN DISPOSABLE) ×1 IMPLANT
GOWN STRL REUS W/TWL LRG LVL3 (GOWN DISPOSABLE) ×6
GOWN STRL REUS W/TWL XL LVL3 (GOWN DISPOSABLE) ×3
KIT BASIN OR (CUSTOM PROCEDURE TRAY) ×3 IMPLANT
KIT ROOM TURNOVER OR (KITS) ×3 IMPLANT
MANIFOLD NEPTUNE II (INSTRUMENTS) ×3 IMPLANT
PACK ARTHROSCOPY DSU (CUSTOM PROCEDURE TRAY) ×3 IMPLANT
PAD ARMBOARD 7.5X6 YLW CONV (MISCELLANEOUS) ×6 IMPLANT
PADDING CAST COTTON 6X4 STRL (CAST SUPPLIES) ×4 IMPLANT
SET ARTHROSCOPY TUBING (MISCELLANEOUS) ×3
SET ARTHROSCOPY TUBING LN (MISCELLANEOUS) ×1 IMPLANT
SPONGE LAP 4X18 X RAY DECT (DISPOSABLE) ×2 IMPLANT
SUT ETHILON 4 0 PS 2 18 (SUTURE) ×3 IMPLANT
SWAB COLLECTION DEVICE MRSA (MISCELLANEOUS) ×2 IMPLANT
TOWEL OR 17X24 6PK STRL BLUE (TOWEL DISPOSABLE) ×6 IMPLANT
TUBE ANAEROBIC SPECIMEN COL (MISCELLANEOUS) ×2 IMPLANT
TUBE CONNECTING 12'X1/4 (SUCTIONS) ×1
TUBE CONNECTING 12X1/4 (SUCTIONS) ×2 IMPLANT
WAND HAND CNTRL MULTIVAC 90 (MISCELLANEOUS) ×2 IMPLANT
WATER STERILE IRR 1000ML POUR (IV SOLUTION) ×3 IMPLANT

## 2014-12-17 NOTE — Transfer of Care (Signed)
Immediate Anesthesia Transfer of Care Note  Patient: Kelly [name]  Procedure(s) Performed: Procedure(s): Synovectomy with medial menisectomy and partial lateral menisectomy chrondroplasty all three compartments and loose body removal (Left) IRRIGATION AND DEBRIDEMENT of left KNEE (Left)  Patient Location: PACU  Anesthesia Type:General  Level of Consciousness: awake, alert  and oriented  Airway & Oxygen Therapy: Patient Spontanous Breathing and Patient connected to nasal cannula oxygen  Post-op Assessment: Report given to RN, Post -op Vital signs reviewed and stable and Patient moving all extremities X 4  Post vital signs: Reviewed and stable  Last Vitals:  Filed Vitals:   12/17/14 1015  BP: 199/80  Pulse: 96  Temp: 36.9 C  Resp: 12    Complications: No apparent anesthesia complications

## 2014-12-17 NOTE — Op Note (Signed)
[name], [name]               ACCOUNT NO.:  [id number]  MEDICAL RECORD NO.:  [id number]  LOCATION:  5N31C                        FACILITY:  [facility]  PHYSICIAN:  Astrid Divine. Marcelino Scot, M.D. DATE OF BIRTH:  04-10-1946  DATE OF PROCEDURE:  12/17/2014 DATE OF DISCHARGE:                              OPERATIVE REPORT   PREOPERATIVE DIAGNOSIS:  Septic left knee.  POSTOPERATIVE DIAGNOSES: 1. Septic left knee. 2. 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 3. Synovitis. 4. 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 5. Lateral meniscus tear. 6. Loose bodies.  PROCEDURES: 1. Medial meniscectomy. 2. Extensive synovectomy and arthroscopic lavage for infection. 3. Partial lateral meniscectomy 4. Chondroplasty of all 3 compartments with removal of multiple loose     bodies.  SURGEON:  Astrid Divine. Marcelino Scot, M.D.  ASSISTANT:  None.  ANESTHESIA:  General.  COMPLICATIONS:  None.  TOURNIQUET:  None.  SPECIMEN:  Two, anaerobic and aerobic sent to Micro, no organisms on Gram-stain.  I/O:  600 mL of crystalloid/UOP 125 mL.  EBL:  20 mL.  DISPOSITION:  PACU.  CONDITION:  Stable.  BRIEF SUMMARY AND INDICATIONS FOR PROCEDURE:  Kelly [name] is a 69 year old female with multiple medical problems, chronic lymphedema, low activity, and a decubitus ulcer of her right posterior thigh.  She developed fever, malaise, and knee pain.  Undergoing aspiration by Dr. Lara Mulch yesterday.  Cell count would eventually confirm septic arthritis.  The patient was started empirically on multiple antibiotics including vancomycin, Flagyl, and aztreonam.  I discussed with the patient risks and benefits of surgery including the possibility of failure to resolve the infection, need for further surgery including repeat arthroscopic lavage, nerve injury, vessel injury, exacerbation of her lymphedema, anesthetic complications including heart attack,  stroke, DVT, PE, and multiple others.  She acknowledged these risks and strongly wished to proceed.  BRIEF SUMMARY OF PROCEDURE:  Kelly [name] was taken to the operating room, where general anesthesia was induced.  After transferring her to the operative table, her left lower extremity was prepped and draped in usual sterile fashion.  No tourniquet was used as her body mass index and anatomic features precluded placement.  Inferolateral and inferomedial portals were identified, injected with Marcaine and epinephrine and then arthroscopic evaluation performed of the entire knee which demonstrated a severe synovitis of all 3 compartments, severe end-stage DJD with osteophytes, eburnated bone along the trochlea and patella though the patella was actually reasonably well-preserved with just scattered grade 4 changes and more extensive grade 3.  The medial compartment was bear and had 0 cartilage.  Evaluation of the medial compartment also revealed a chronic bucket-handle tear with absent medial meniscus that was identified along the eminence on the lateral side of the medial compartment.  The remaining portion of the medial meniscus from the junction of the anterior and mid body to the posterior horn can be completely displaced into the joint.  This was folded over and truncated with an arthroscopic biter taking the clamp and then removing essentially 60% of the remaining meniscus through the arthroscopic portal.  Further evaluation demonstrated a large loose body in the anterior compartment as well  as a smaller one in the medial gutter.  There were no lateral gutter loose bodies.  There was a degenerative tear along the anterior aspect of the meniscus from mid body around the posterior horn in particular.  The grade 4 changes in the lateral compartment were much less severe and were mixed in with grade 3 changes.  In addition to the meniscectomy which was performed again with  the arthroscopic biter, the edges were rounded back to a stable edge that could not displace into the joint where it remained over 25% section. The articular surface on the femoral and tibial surface was shaved with the arthroscopic shaver.  I then removed the meniscus that had lodged against the eminence.  On the lateral side, arthroscopic shaver was used to debride the cartilage surfaces as well as the lateral meniscus and then the arthroscopic wand and cautery used to ablate the synovitis and the anterior, medial, lateral, and suprapatellar regions of the joint. A grasper was used to remove the loose bodies, and the arthroscopic shaver for some of that which was debrided in stepwise fashion.  The joint was cultured as soon as the scope was introduced into the knee as gross purulent fluid was returned and sent for anaerobic and aerobic culture.  Over 9,000 mL was flushed through the knee and the knee joint did clear quite well over the duration of the procedure, furthermore the hyperemia eased as well.  Wounds were closed and knee joint injected with another 20 mL of 0.25% Marcaine.  A deep drain was placed and sewed in along the medial suprapatellar region that was inserted to the inferolateral portal.  Sterile gently compressive dressing, then from foot to thigh was applied.  The patient was awakened from anesthesia and transferred to PACU in stable condition.  PROGNOSIS:  Kelly [name] will remain on antibiotics under the care of the Medical Service.  Will watch for drainage from the knee joint given the excellent clearance during the procedure.  We will follow her symptomatically to see if she needs to return to the OR for repeat lavage but at this time, it is possible she may not require further surgery.  For long-term relief, she would need a total knee arthroplasty, but the risk of infection is possibly prohibitive given now an infected tissue bed and chronic  lymphedema.     Astrid Divine. Marcelino Scot, M.D.     MHH/MEDQ  D:  12/17/2014  T:  12/17/2014  Job:  [id number]

## 2014-12-17 NOTE — Anesthesia Procedure Notes (Signed)
Procedure Name: LMA Insertion Date/Time: 12/17/2014 8:20 AM Performed by: Merrilyn Puma B Pre-anesthesia Checklist: Patient identified, Timeout performed, Emergency Drugs available, Suction available and Patient being monitored Patient Re-evaluated:Patient Re-evaluated prior to inductionOxygen Delivery Method: Circle system utilized Preoxygenation: Pre-oxygenation with 100% oxygen Intubation Type: IV induction Ventilation: Mask ventilation without difficulty LMA: LMA with gastric port inserted LMA Size: 4.0 Number of attempts: 1 Placement Confirmation: positive ETCO2 and breath sounds checked- equal and bilateral Tube secured with: Tape Dental Injury: Teeth and Oropharynx as per pre-operative assessment

## 2014-12-17 NOTE — Brief Op Note (Signed)
12/15/2014 - 12/17/2014  10:28 AM  PATIENT:  Kelly [name]  69 y.o. female  PRE-OPERATIVE DIAGNOSIS:  SEPTIC LEFT KNEE  POST-OPERATIVE DIAGNOSIS:   1. SEPTIC LEFT KNEE 2. Medial meniscus tear, chronic bucket handle tear 3. Extensive synovitis 4. Tricompartmental grade 4 chondromalacia, end stage DJD 5. Lateral meniscus tear 6. Loose bodies  PROCEDURE:  Procedure(s): 1. Medial menisectomy 2. Arthroscopic lavage for infection and extensive synovectomy 3. Partial lateral menisectomy 4. Chrondroplasty all three compartments and loose body removal (Left)  IRRIGATION AND DEBRIDEMENT of left KNEE (Left)  SURGEON:  Surgeon(s) and Role:    * Altamese Nicoma Park, MD - Primary  PHYSICIAN ASSISTANT: Ainsley Spinner, PA-C  ANESTHESIA:   general  I/O:  Total I/O In: 600 [I.V.:600] Out: 145 [Urine:125; Blood:20]  SPECIMEN:  Source of Specimen:  knee joint aerobic and anaerobic  DISPOSITION OF SPECIMEN:  To micro  TOURNIQUET:  * No tourniquets in log *  DICTATION: .Other Dictation: Dictation Number [id number]

## 2014-12-17 NOTE — Progress Notes (Signed)
[facility] PROGRESS NOTE  Kelly [name]:[id number] DOB: 09-Jun-1946 DOA: 12/15/2014 PCP: Alvester Chou, NP  Brief narrative 69 year old morbidly obese female with history of diastolic CHF, lymphedema and recurrent cellulitis who was admitted to [facility] on 4/24 for sepsis secondary to septic left knee. Patient transferred to [place] and underwent irrigation and debridement of left knee showing septic left knee joint , medial and lateral meniscus tear, extensive synovitis, and tricompartmental chondromalacia.   Assessment/Plan: Sepsis due to septic arthritis of left knee Patient underwent arthroscopic lavage of infection and extensive synovectomy today. Wound VAC placed. Continue empiric IV vancomycin , Flagyl and aztreonam. follow cultures. PT eval. Pain control with when necessary Dilaudid.   Chronic Kidney disease stage III Renal function improving close to baseline.  Chronic diastolic CHF Appears euvolemic. Monitor closely. Continue Imdur and metoprolol  Type 2 diabetes mellitus On sliding scale insulin  Right posterior thigh pressure ulcer Seen by wound care. Recommend Silver hydrofiber for recalcitrant wound, cover with foam. Change silver hydrofiber daily.Pressure redistribution mattress  Morbid obesity  Chronic venous insufficiency  DVT prophylaxis: Subcutaneous heparin  Diet: Heart healthy  Code Status: full code Family Communication: none at bedside Disposition Plan: needs SNF, possibly early next week   Consultants:  Orthopedics ( Dr Marcelino Scot)  Procedures:  Irrigation and debridement of left knee on 4/22 with  1. Medial menisectomy 2. Arthroscopic lavage for infection and extensive synovectomy 3. Partial lateral menisectomy 4. Chrondroplasty all three compartments and loose body removal (Left)  Antibiotics:  IV vanco , Flagyl and aztreonam since 4/21  HPI/Subjective: Patient seen and examined after returning from OR. Complains of  some pain over the left leg   Objective: Filed Vitals:   12/17/14 1205  BP: 177/72  Pulse: 90  Temp: 97.6 F (36.4 C)  Resp: 15    Intake/Output Summary (Last 24 hours) at 12/17/14 1416 Last data filed at 12/17/14 1200  Gross per 24 hour  Intake   1350 ml  Output    595 ml  Net    755 ml   Filed Weights   12/15/14 1918 12/17/14 0300 12/17/14 1205  Weight: 139.708 kg (308 lb) 151.048 kg (333 lb) 151 kg (332 lb 14.3 oz)    Exam:   General:  Morbidly obese female in no acute distress  HEENT: No pallor, moist oral mucosa  Chest: Diminished breath sounds due to body habitus  CVS: Normal S1 and S2, no murmurs rub or gallop   GI: Soft, nondistended, nontender, bowel sounds present   Musculoskeletal: Ace wrap over left leg with wound VAC in place, chronic lymphedema  CNS: Alert and oriented   Data Reviewed: Basic Metabolic Panel:  Recent Labs Lab 12/15/14 1910 12/16/14 0558  NA 137 140  K 4.5 4.6  CL 102 105  CO2 24 25  GLUCOSE 214* 225*  BUN 40* 37*  CREATININE 2.18* 1.90*  CALCIUM 8.4 8.3*  MG  --  1.7  PHOS  --  4.2   Liver Function Tests:  Recent Labs Lab 12/15/14 1910 12/16/14 0558  AST 18 18  ALT 12 11  ALKPHOS 97 90  BILITOT 0.4 0.6  PROT 8.7* 7.7  ALBUMIN 2.9* 2.4*   No results for input(s): LIPASE, AMYLASE in the last 168 hours. No results for input(s): AMMONIA in the last 168 hours. CBC:  Recent Labs Lab 12/14/14 1045 12/15/14 1910 12/16/14 0558  WBC  --  15.2* 13.0*  NEUTROABS  --  13.3*  --  HGB 10.8* 12.1 11.1*  HCT  --  38.1 35.3*  MCV  --  88.8 88.0  PLT  --  231 207   Cardiac Enzymes: No results for input(s): CKTOTAL, CKMB, CKMBINDEX, TROPONINI in the last 168 hours. BNP (last 3 results)  Recent Labs  09/03/14 0407 11/23/14 1306  BNP 507.4* 194.0*    ProBNP (last 3 results)  Recent Labs  07/11/14 1959  PROBNP 2482.0*    CBG:  Recent Labs Lab 12/16/14 2103 12/17/14 0001 12/17/14 0403  12/17/14 0702 12/17/14 1021  GLUCAP 120* 134* 140* 146* 133*    Recent Results (from the past 240 hour(s))  Blood Culture (routine x 2)     Status: None (Preliminary result)   Collection Time: 12/15/14  7:00 PM  Result Value Ref Range Status   Specimen Description BLOOD LEFT ANTECUBITAL  Final   Special Requests BOTTLES DRAWN AEROBIC AND ANAEROBIC 3ML  Final   Culture   Final           BLOOD CULTURE RECEIVED NO GROWTH TO DATE CULTURE WILL BE HELD FOR 5 DAYS BEFORE ISSUING A FINAL NEGATIVE REPORT Performed at [facility]    Report Status PENDING  Incomplete  Blood Culture (routine x 2)     Status: None (Preliminary result)   Collection Time: 12/15/14  7:00 PM  Result Value Ref Range Status   Specimen Description BLOOD LEFT HAND  Final   Special Requests BOTTLES DRAWN AEROBIC ONLY 3ML  Final   Culture   Final           BLOOD CULTURE RECEIVED NO GROWTH TO DATE CULTURE WILL BE HELD FOR 5 DAYS BEFORE ISSUING A FINAL NEGATIVE REPORT Performed at [facility]    Report Status PENDING  Incomplete  Urine culture     Status: None   Collection Time: 12/15/14  9:26 PM  Result Value Ref Range Status   Specimen Description URINE, CATHETERIZED  Final   Special Requests NONE  Final   Colony Count NO GROWTH Performed at [facility]   Final   Culture NO GROWTH Performed at [facility]   Final   Report Status 12/17/2014 FINAL  Final  MRSA PCR Screening     Status: Abnormal   Collection Time: 12/16/14  3:37 AM  Result Value Ref Range Status   MRSA by PCR POSITIVE (A) NEGATIVE Final    Comment:        The GeneXpert MRSA Assay (FDA approved for NASAL specimens only), is one component of a comprehensive MRSA colonization surveillance program. It is not intended to diagnose MRSA infection nor to guide or monitor treatment for MRSA infections. RESULT CALLED TO, READ BACK BY AND VERIFIED WITH: C. YOUNG RN AT 0615 ON 04.21.16 BY SHUEA   Body fluid  culture     Status: None (Preliminary result)   Collection Time: 12/16/14 12:56 PM  Result Value Ref Range Status   Specimen Description SYNOVIAL LT KNEE  Final   Special Requests NONE  Final   Gram Stain   Final    MODERATE WBC PRESENT,BOTH PMN AND MONONUCLEAR NO ORGANISMS SEEN Performed at [facility]    Culture NO GROWTH Performed at [facility]   Final   Report Status PENDING  Incomplete  Gram stain     Status: None   Collection Time: 12/17/14  8:45 AM  Result Value Ref Range Status   Specimen Description FLUID SYNOVIAL LEFT KNEE  Final  Special Requests POF VANCOMYCIN,FLAGEL AND AZACTAM  Final   Gram Stain   Final    ABUNDANT WBC PRESENT,BOTH PMN AND MONONUCLEAR NO ORGANISMS SEEN    Report Status 12/17/2014 FINAL  Final     Studies: Dg Chest Portable 1 View  12/15/2014   CLINICAL DATA:  Cellulitis and hypertension  EXAM: PORTABLE CHEST - 1 VIEW  COMPARISON:  11/23/2014  FINDINGS: There is chronic cardiomegaly and chronic fullness of the hila which is likely vascular. There is pulmonary venous congestion and cephalization without overt edema. No convincing pneumonia or pleural effusion.  IMPRESSION: Cardiomegaly and pulmonary venous congestion.   Electronically Signed   By: Monte Fantasia M.D.   On: 12/15/2014 22:25   Dg Knee Complete 4 Views Left  12/16/2014   CLINICAL DATA:  Cellulitis.  Fever.  Pain.  EXAM: LEFT KNEE - COMPLETE 4+ VIEW  COMPARISON:  None.  FINDINGS: There is advanced tricompartmental osteoarthritis with joint space loss and marginal osteophytes. There are intra-articular loose bodies. There is a joint effusion. There is regional arterial calcification. No visible fracture. No sign of osteomyelitis.  IMPRESSION: Advanced tricompartmental osteoarthritis. Joint effusion with intra-articular loose bodies.   Electronically Signed   By: Nelson Chimes M.D.   On: 12/16/2014 01:17   Dg Tibia/fibula Left Port  12/15/2014   CLINICAL DATA:  Left leg  cellulitis, left lower leg pain.  EXAM: PORTABLE LEFT TIBIA AND FIBULA - 2 VIEW  COMPARISON:  05/09/2014 CT  FINDINGS: Osteopenia. Degenerative changes about the knee, incompletely assessed. Atherosclerotic vascular calcifications. No displaced fracture or aggressive osseous lesion. Diffuse soft tissue swelling. Posterior calcaneal enthesophyte.  IMPRESSION: Osteopenia and advanced atherosclerotic disease. Soft tissue swelling. No acute or aggressive osseous finding of the left tibia/fibula.   Electronically Signed   By: Carlos Levering M.D.   On: 12/15/2014 22:27    Scheduled Meds: . aztreonam  1 g Intravenous 3 times per day  . colchicine  1.2 mg Oral Daily  . gabapentin  300 mg Oral TID  . HYDROmorphone      . HYDROmorphone      . HYDROmorphone      . insulin aspart  0-9 Units Subcutaneous 6 times per day  . isosorbide mononitrate  10 mg Oral BID  . metoprolol tartrate  75 mg Oral BID  . metronidazole  500 mg Intravenous Q8H  . pantoprazole  40 mg Oral Daily  . polyethylene glycol  17 g Oral Daily  . promethazine      . senna  1 tablet Oral BID  . sodium chloride  3 mL Intravenous Q12H  . sodium chloride  3 mL Intravenous Q12H  . vancomycin  1,750 mg Intravenous Q24H   Continuous Infusions:      Time spent: 25 minutes    Nikola Marone, [place]  [facility] Pager [phone]. If 7PM-7AM, please contact night-coverage at [email or web address], password [facility] 12/17/2014, 2:16 PM  LOS: 2 days

## 2014-12-17 NOTE — Anesthesia Postprocedure Evaluation (Signed)
  Anesthesia Post-op Note  Patient: Kelly [name]  Procedure(s) Performed: Procedure(s): Synovectomy with medial menisectomy and partial lateral menisectomy chrondroplasty all three compartments and loose body removal (Left) IRRIGATION AND DEBRIDEMENT of left KNEE (Left)  Patient Location: PACU  Anesthesia Type:General  Level of Consciousness: awake and alert   Airway and Oxygen Therapy: Patient Spontanous Breathing  Post-op Pain: mild  Post-op Assessment: Post-op Vital signs reviewed  Post-op Vital Signs: Reviewed  Last Vitals:  Filed Vitals:   12/17/14 1205  BP: 177/72  Pulse: 90  Temp: 36.4 C  Resp: 15    Complications: No apparent anesthesia complications

## 2014-12-17 NOTE — Anesthesia Preprocedure Evaluation (Addendum)
Anesthesia Evaluation  Patient identified by MRN, date of birth, ID band Patient awake    Reviewed: Allergy & Precautions, NPO status , Patient's Chart, lab work & pertinent test results  Airway Mallampati: II  TM Distance: >3 FB Neck ROM: Full    Dental  (+) Partial Lower, Partial Upper   Pulmonary sleep apnea , former smoker,  breath sounds clear to auscultation        Cardiovascular hypertension, + CAD, + Peripheral Vascular Disease and +CHF Rhythm:Regular Rate:Normal  Nl EF. Pulm HTN.   Neuro/Psych  Headaches, PSYCHIATRIC DISORDERS Depression    GI/Hepatic Neg liver ROS, GERD-  ,  Endo/Other  diabetes, Type 2Morbid obesity  Renal/GU CRFRenal disease     Musculoskeletal   Abdominal   Peds  Hematology  (+) anemia ,   Anesthesia Other Findings   Reproductive/Obstetrics                           Anesthesia Physical Anesthesia Plan  ASA: III  Anesthesia Plan: General   Post-op Pain Management:    Induction: Intravenous  Airway Management Planned: Oral ETT  Additional Equipment:   Intra-op Plan:   Post-operative Plan: Extubation in OR  Informed Consent: I have reviewed the patients History and Physical, chart, labs and discussed the procedure including the risks, benefits and alternatives for the proposed anesthesia with the patient or authorized representative who has indicated his/her understanding and acceptance.   Dental advisory given  Plan Discussed with: CRNA  Anesthesia Plan Comments:         Anesthesia Quick Evaluation

## 2014-12-18 LAB — BASIC METABOLIC PANEL
Anion gap: 11 (ref 5–15)
BUN: 42 mg/dL — ABNORMAL HIGH (ref 6–23)
CHLORIDE: 106 mmol/L (ref 96–112)
CO2: 23 mmol/L (ref 19–32)
Calcium: 7.9 mg/dL — ABNORMAL LOW (ref 8.4–10.5)
Creatinine, Ser: 1.91 mg/dL — ABNORMAL HIGH (ref 0.50–1.10)
GFR, EST AFRICAN AMERICAN: 30 mL/min — AB (ref >90)
GFR, EST NON AFRICAN AMERICAN: 26 mL/min — AB (ref >90)
GLUCOSE: 57 mg/dL — AB (ref 70–99)
POTASSIUM: 4.1 mmol/L (ref 3.5–5.1)
SODIUM: 140 mmol/L (ref 135–145)

## 2014-12-18 LAB — GLUCOSE, CAPILLARY
GLUCOSE-CAPILLARY: 53 mg/dL — AB (ref 70–99)
Glucose-Capillary: 100 mg/dL — ABNORMAL HIGH (ref 70–99)
Glucose-Capillary: 101 mg/dL — ABNORMAL HIGH (ref 70–99)
Glucose-Capillary: 106 mg/dL — ABNORMAL HIGH (ref 70–99)
Glucose-Capillary: 106 mg/dL — ABNORMAL HIGH (ref 70–99)
Glucose-Capillary: 39 mg/dL — CL (ref 70–99)
Glucose-Capillary: 45 mg/dL — ABNORMAL LOW (ref 70–99)
Glucose-Capillary: 58 mg/dL — ABNORMAL LOW (ref 70–99)
Glucose-Capillary: 59 mg/dL — ABNORMAL LOW (ref 70–99)
Glucose-Capillary: 79 mg/dL (ref 70–99)
Glucose-Capillary: 79 mg/dL (ref 70–99)

## 2014-12-18 LAB — CBC
HCT: 32.3 % — ABNORMAL LOW (ref 36.0–46.0)
Hemoglobin: 10.2 g/dL — ABNORMAL LOW (ref 12.0–15.0)
MCH: 28.3 pg (ref 26.0–34.0)
MCHC: 31.6 g/dL (ref 30.0–36.0)
MCV: 89.5 fL (ref 78.0–100.0)
PLATELETS: 200 10*3/uL (ref 150–400)
RBC: 3.61 MIL/uL — AB (ref 3.87–5.11)
RDW: 15.4 % (ref 11.5–15.5)
WBC: 9.5 10*3/uL (ref 4.0–10.5)

## 2014-12-18 MED ORDER — WHITE PETROLATUM GEL
Status: AC
  Administered 2014-12-18: 0.2
  Filled 2014-12-18: qty 1

## 2014-12-18 MED ORDER — GLUCOSE 40 % PO GEL
ORAL | Status: AC
  Administered 2014-12-18: 37.5 g
  Filled 2014-12-18: qty 1

## 2014-12-18 NOTE — Progress Notes (Signed)
[facility] PROGRESS NOTE  Kelly [name] EGB:[id number] DOB: Mar 02, 1946 DOA: 12/15/2014 PCP: Alvester Chou, NP  Brief narrative 69 year old morbidly obese female with history of diastolic CHF, lymphedema and recurrent cellulitis who was admitted to [facility] on 4/24 for sepsis secondary to septic left knee. Patient transferred to [place] and underwent irrigation and debridement of left knee showing septic left knee joint , medial and lateral meniscus tear, extensive synovitis, and tricompartmental chondromalacia.   Assessment/Plan: Sepsis due to septic arthritis of left knee Patient underwent arthroscopic lavage of infection and extensive synovectomy. Wound VAC placed. Continue empiric IV vancomycin , Flagyl and aztreonam for today and narrow down to IV Rocephin or quinolone. Culture so far negative.  PT eval. Pain control with when necessary Dilaudid.   Chronic Kidney disease stage III Renal function at baseline  Chronic diastolic CHF Appears euvolemic. Continue Imdur and metoprolol  Type 2 diabetes mellitus On sliding scale insulin  Right posterior thigh pressure ulcer Seen by wound care. Recommend Silver hydrofiber for recalcitrant wound, cover with foam. Change silver hydrofiber daily.Pressure redistribution mattress  Morbid obesity  Chronic venous insufficiency  DVT prophylaxis: Subcutaneous heparin  Diet: Heart healthy  Code Status: full code Family Communication: none at bedside Disposition Plan: needs SNF, possibly early next week   Consultants:  Orthopedics ( Dr Marcelino Scot)  Procedures:  Irrigation and debridement of left knee on 4/22 with  1. Medial menisectomy 2. Arthroscopic lavage for infection and extensive synovectomy 3. Partial lateral menisectomy 4. Chrondroplasty all three compartments and loose body removal (Left)  Antibiotics:  IV vanco , Flagyl and aztreonam since 4/21--  HPI/Subjective: Patient seen and examined . Pain  improved. Febrile to 100.7 overnight.   Objective: Filed Vitals:   12/18/14 1222  BP: 117/58  Pulse:   Temp: 98.6 F (37 C)  Resp: 18    Intake/Output Summary (Last 24 hours) at 12/18/14 1323 Last data filed at 12/18/14 0800  Gross per 24 hour  Intake    630 ml  Output    550 ml  Net     80 ml   Filed Weights   12/15/14 1918 12/17/14 0300 12/17/14 1205  Weight: 139.708 kg (308 lb) 151.048 kg (333 lb) 151 kg (332 lb 14.3 oz)    Exam:   General:  Morbidly obese female in no acute distress  HEENT: moist oral mucosa, supple neck  Chest: Diminished breath sounds due to body habitus  CVS: Normal S1 and S2, no murmurs rub or gallop   GI: Soft, nondistended, nontender, bowel sounds present   Musculoskeletal: Ace wrap over left leg with wound VAC in place, chronic lymphedema  CNS: Alert and oriented   Data Reviewed: Basic Metabolic Panel:  Recent Labs Lab 12/15/14 1910 12/16/14 0558 12/18/14 0531  NA 137 140 140  K 4.5 4.6 4.1  CL 102 105 106  CO2 24 25 23   GLUCOSE 214* 225* 57*  BUN 40* 37* 42*  CREATININE 2.18* 1.90* 1.91*  CALCIUM 8.4 8.3* 7.9*  MG  --  1.7  --   PHOS  --  4.2  --    Liver Function Tests:  Recent Labs Lab 12/15/14 1910 12/16/14 0558  AST 18 18  ALT 12 11  ALKPHOS 97 90  BILITOT 0.4 0.6  PROT 8.7* 7.7  ALBUMIN 2.9* 2.4*   No results for input(s): LIPASE, AMYLASE in the last 168 hours. No results for input(s): AMMONIA in the last 168 hours. CBC:  Recent Labs Lab 12/14/14  1045 12/15/14 1910 12/16/14 0558 12/18/14 0531  WBC  --  15.2* 13.0* 9.5  NEUTROABS  --  13.3*  --   --   HGB 10.8* 12.1 11.1* 10.2*  HCT  --  38.1 35.3* 32.3*  MCV  --  88.8 88.0 89.5  PLT  --  231 207 200   Cardiac Enzymes: No results for input(s): CKTOTAL, CKMB, CKMBINDEX, TROPONINI in the last 168 hours. BNP (last 3 results)  Recent Labs  09/03/14 0407 11/23/14 1306  BNP 507.4* 194.0*    ProBNP (last 3 results)  Recent Labs   07/11/14 1959  PROBNP 2482.0*    CBG:  Recent Labs Lab 12/18/14 0405 12/18/14 0526 12/18/14 0643 12/18/14 0840 12/18/14 1217  GLUCAP 58* 59* 106* 100* 79    Recent Results (from the past 240 hour(s))  Blood Culture (routine x 2)     Status: None (Preliminary result)   Collection Time: 12/15/14  7:00 PM  Result Value Ref Range Status   Specimen Description BLOOD LEFT ANTECUBITAL  Final   Special Requests BOTTLES DRAWN AEROBIC AND ANAEROBIC 3ML  Final   Culture   Final           BLOOD CULTURE RECEIVED NO GROWTH TO DATE CULTURE WILL BE HELD FOR 5 DAYS BEFORE ISSUING A FINAL NEGATIVE REPORT Performed at [facility]    Report Status PENDING  Incomplete  Blood Culture (routine x 2)     Status: None (Preliminary result)   Collection Time: 12/15/14  7:00 PM  Result Value Ref Range Status   Specimen Description BLOOD LEFT HAND  Final   Special Requests BOTTLES DRAWN AEROBIC ONLY 3ML  Final   Culture   Final           BLOOD CULTURE RECEIVED NO GROWTH TO DATE CULTURE WILL BE HELD FOR 5 DAYS BEFORE ISSUING A FINAL NEGATIVE REPORT Performed at [facility]    Report Status PENDING  Incomplete  Urine culture     Status: None   Collection Time: 12/15/14  9:26 PM  Result Value Ref Range Status   Specimen Description URINE, CATHETERIZED  Final   Special Requests NONE  Final   Colony Count NO GROWTH Performed at [facility]   Final   Culture NO GROWTH Performed at [facility]   Final   Report Status 12/17/2014 FINAL  Final  MRSA PCR Screening     Status: Abnormal   Collection Time: 12/16/14  3:37 AM  Result Value Ref Range Status   MRSA by PCR POSITIVE (A) NEGATIVE Final    Comment:        The GeneXpert MRSA Assay (FDA approved for NASAL specimens only), is one component of a comprehensive MRSA colonization surveillance program. It is not intended to diagnose MRSA infection nor to guide or monitor treatment for MRSA infections. RESULT  CALLED TO, READ BACK BY AND VERIFIED WITH: C. YOUNG RN AT 0615 ON 04.21.16 BY SHUEA   Body fluid culture     Status: None (Preliminary result)   Collection Time: 12/16/14 12:56 PM  Result Value Ref Range Status   Specimen Description SYNOVIAL LT KNEE  Final   Special Requests NONE  Final   Gram Stain   Final    MODERATE WBC PRESENT,BOTH PMN AND MONONUCLEAR NO ORGANISMS SEEN Performed at [facility]    Culture NO GROWTH Performed at [facility]   Final   Report Status PENDING  Incomplete  Anaerobic culture  Status: None (Preliminary result)   Collection Time: 12/17/14  8:45 AM  Result Value Ref Range Status   Specimen Description FLUID SYNOVIAL LEFT KNEE  Final   Special Requests POF VANCOMYCIN,FLAGEL AD AZACTAM  Final   Gram Stain   Final    ABUNDANT WBC PRESENT,BOTH PMN AND MONONUCLEAR NO ORGANISMS SEEN Performed at [facility] Performed at [facility]    Culture   Final    NO ANAEROBES ISOLATED; CULTURE IN PROGRESS FOR 5 DAYS Performed at [facility]    Report Status PENDING  Incomplete  Gram stain     Status: None   Collection Time: 12/17/14  8:45 AM  Result Value Ref Range Status   Specimen Description FLUID SYNOVIAL LEFT KNEE  Final   Special Requests POF VANCOMYCIN,FLAGEL AND AZACTAM  Final   Gram Stain   Final    ABUNDANT WBC PRESENT,BOTH PMN AND MONONUCLEAR NO ORGANISMS SEEN    Report Status 12/17/2014 FINAL  Final  Body fluid culture     Status: None (Preliminary result)   Collection Time: 12/17/14  8:45 AM  Result Value Ref Range Status   Specimen Description FLUID SYNOVIAL LEFT KNEE  Final   Special Requests PATIENT ON FOLLOWING VANC,FLAGEL,AZACTAM  Final   Gram Stain   Final    ABUNDANT WBC PRESENT,BOTH PMN AND MONONUCLEAR NO ORGANISMS SEEN Performed at [facility] Performed at [facility]    Culture PENDING  Incomplete   Report Status PENDING  Incomplete     Studies: No results  found.  Scheduled Meds: . aztreonam  1 g Intravenous 3 times per day  . colchicine  1.2 mg Oral Daily  . gabapentin  300 mg Oral TID  . insulin aspart  0-15 Units Subcutaneous TID WC  . insulin aspart  0-5 Units Subcutaneous QHS  . isosorbide mononitrate  10 mg Oral BID  . metoprolol tartrate  75 mg Oral BID  . metronidazole  500 mg Intravenous Q8H  . pantoprazole  40 mg Oral Daily  . polyethylene glycol  17 g Oral Daily  . senna  1 tablet Oral BID  . sodium chloride  3 mL Intravenous Q12H  . sodium chloride  3 mL Intravenous Q12H  . vancomycin  1,750 mg Intravenous Q24H   Continuous Infusions:      Time spent: 25 minutes    Annalise Mcdiarmid, [place]  [facility] Pager [phone]. If 7PM-7AM, please contact night-coverage at [email or web address], password [facility] 12/18/2014, 1:23 PM  LOS: 3 days

## 2014-12-18 NOTE — Progress Notes (Signed)
ANTIBIOTIC CONSULT NOTE - follow up  Pharmacy Consult for Vancomycin, Flagyl, Aztreonam Indication: Cellulitis  Allergies  Allergen Reactions  . Bactrim [Sulfamethoxazole-Trimethoprim] Other (See Comments)    Hyperkalemia (July 2015)  . Penicillins Hives    Has taken cephalexin 06/2014    Patient Measurements: Height: 5\' 6"  (167.6 cm) Weight: (!) 332 lb 14.3 oz (151 kg) IBW/kg (Calculated) : 59.3 Adjusted Body Weight:   Vital Signs: Temp: 98.6 F (37 C) (04/23 1222) Temp Source: Oral (04/23 1222) BP: 117/58 mmHg (04/23 1222) Pulse Rate: 84 (04/23 0414) Intake/Output from previous day: 04/22 0701 - 04/23 0700 In: 1230 [P.O.:380; I.V.:600; IV Piggyback:150] Out: 695 [Urine:625; Drains:50; Blood:20] Intake/Output from this shift: Total I/O In: 100 [P.O.:100] Out: -   Labs:  Recent Labs  12/15/14 1910 12/16/14 0558 12/18/14 0531  WBC 15.2* 13.0* 9.5  HGB 12.1 11.1* 10.2*  PLT 231 207 200  CREATININE 2.18* 1.90* 1.91*   Estimated Creatinine Clearance: 43.3 mL/min (by C-G formula based on Cr of 1.91). No results for input(s): VANCOTROUGH, VANCOPEAK, VANCORANDOM, GENTTROUGH, GENTPEAK, GENTRANDOM, TOBRATROUGH, TOBRAPEAK, TOBRARND, AMIKACINPEAK, AMIKACINTROU, AMIKACIN in the last 72 hours.   Microbiology: Recent Results (from the past 720 hour(s))  MRSA PCR Screening     Status: Abnormal   Collection Time: 11/23/14 11:47 AM  Result Value Ref Range Status   MRSA by PCR POSITIVE (A) NEGATIVE Final    Comment:        The GeneXpert MRSA Assay (FDA approved for NASAL specimens only), is one component of a comprehensive MRSA colonization surveillance program. It is not intended to diagnose MRSA infection nor to guide or monitor treatment for MRSA infections. RESULT CALLED TO, READ BACK BY AND VERIFIED WITH: T.NICHOLS,RN AT 0055 ON 11/24/14 BY W.SHEA   Blood Culture (routine x 2)     Status: None (Preliminary result)   Collection Time: 12/15/14  7:00 PM  Result  Value Ref Range Status   Specimen Description BLOOD LEFT ANTECUBITAL  Final   Special Requests BOTTLES DRAWN AEROBIC AND ANAEROBIC 3ML  Final   Culture   Final           BLOOD CULTURE RECEIVED NO GROWTH TO DATE CULTURE WILL BE HELD FOR 5 DAYS BEFORE ISSUING A FINAL NEGATIVE REPORT Performed at [facility]    Report Status PENDING  Incomplete  Blood Culture (routine x 2)     Status: None (Preliminary result)   Collection Time: 12/15/14  7:00 PM  Result Value Ref Range Status   Specimen Description BLOOD LEFT HAND  Final   Special Requests BOTTLES DRAWN AEROBIC ONLY 3ML  Final   Culture   Final           BLOOD CULTURE RECEIVED NO GROWTH TO DATE CULTURE WILL BE HELD FOR 5 DAYS BEFORE ISSUING A FINAL NEGATIVE REPORT Performed at [facility]    Report Status PENDING  Incomplete  Urine culture     Status: None   Collection Time: 12/15/14  9:26 PM  Result Value Ref Range Status   Specimen Description URINE, CATHETERIZED  Final   Special Requests NONE  Final   Colony Count NO GROWTH Performed at [facility]   Final   Culture NO GROWTH Performed at [facility]   Final   Report Status 12/17/2014 FINAL  Final  MRSA PCR Screening     Status: Abnormal   Collection Time: 12/16/14  3:37 AM  Result Value Ref Range Status   MRSA by PCR  POSITIVE (A) NEGATIVE Final    Comment:        The GeneXpert MRSA Assay (FDA approved for NASAL specimens only), is one component of a comprehensive MRSA colonization surveillance program. It is not intended to diagnose MRSA infection nor to guide or monitor treatment for MRSA infections. RESULT CALLED TO, READ BACK BY AND VERIFIED WITH: C. YOUNG RN AT 0615 ON 04.21.16 BY SHUEA   Body fluid culture     Status: None (Preliminary result)   Collection Time: 12/16/14 12:56 PM  Result Value Ref Range Status   Specimen Description SYNOVIAL LT KNEE  Final   Special Requests NONE  Final   Gram Stain   Final    MODERATE  WBC PRESENT,BOTH PMN AND MONONUCLEAR NO ORGANISMS SEEN Performed at [facility]    Culture NO GROWTH Performed at [facility]   Final   Report Status PENDING  Incomplete  Anaerobic culture     Status: None (Preliminary result)   Collection Time: 12/17/14  8:45 AM  Result Value Ref Range Status   Specimen Description FLUID SYNOVIAL LEFT KNEE  Final   Special Requests POF VANCOMYCIN,FLAGEL AD AZACTAM  Final   Gram Stain   Final    ABUNDANT WBC PRESENT,BOTH PMN AND MONONUCLEAR NO ORGANISMS SEEN Performed at [facility] Performed at [facility]    Culture   Final    NO ANAEROBES ISOLATED; CULTURE IN PROGRESS FOR 5 DAYS Performed at [facility]    Report Status PENDING  Incomplete  Gram stain     Status: None   Collection Time: 12/17/14  8:45 AM  Result Value Ref Range Status   Specimen Description FLUID SYNOVIAL LEFT KNEE  Final   Special Requests POF VANCOMYCIN,FLAGEL AND AZACTAM  Final   Gram Stain   Final    ABUNDANT WBC PRESENT,BOTH PMN AND MONONUCLEAR NO ORGANISMS SEEN    Report Status 12/17/2014 FINAL  Final  AFB culture with smear     Status: None (Preliminary result)   Collection Time: 12/17/14  8:45 AM  Result Value Ref Range Status   Specimen Description FLUID SYNOVIAL LEFT KNEE  Final   Special Requests POF VANCOMYCIN,FLAGEL AND AZACTAM  Final   Acid Fast Smear   Final    NO ACID FAST BACILLI SEEN Performed at [facility]    Culture   Final    CULTURE WILL BE EXAMINED FOR 6 WEEKS BEFORE ISSUING A FINAL REPORT Performed at [facility]    Report Status PENDING  Incomplete  Body fluid culture     Status: None (Preliminary result)   Collection Time: 12/17/14  8:45 AM  Result Value Ref Range Status   Specimen Description FLUID SYNOVIAL LEFT KNEE  Final   Special Requests PATIENT ON FOLLOWING VANC,FLAGEL,AZACTAM  Final   Gram Stain   Final    ABUNDANT WBC PRESENT,BOTH PMN AND MONONUCLEAR NO ORGANISMS  SEEN Performed at [facility] Performed at [facility]    Culture PENDING  Incomplete   Report Status PENDING  Incomplete    Medical History: Past Medical History  Diagnosis Date  . Hypertension   . Hyperlipidemia   . CHF (congestive heart failure)   . GERD (gastroesophageal reflux disease)   . Depression   . Lymphedema of leg     left leg  . Pulmonary embolism 02/18/14    diagnosed at [facility] with CT angio chest  . Chronic indwelling Foley catheter   .  Heart murmur     "just found out I have one" (04/07/2014)  . DVT (deep venous thrombosis) 01/2014    LLE; "went from my leg to my lungs"  . Pneumonia 1990's X 2  . Sleep apnea     doesn't use cpap machine or 02 at night (04/07/2014)  . Anemia   . History of blood transfusion 1990's X 2    "when they did my surgeries"  . Migraines     "none for years now" (04/07/2014)  . Arthritis     "arms, legs" (04/07/2014)  . Gout   . Urinary incontinence   . Chronic kidney disease (CKD), stage III (moderate)     [name] 04/07/2014  . Neuropathy   . Diabetes mellitus     "at one time" (04/07/2014)    Assessment: 52 yoF with complicated PMHx on D#4 antibiotics for sepsis 2/2 septic arthritis of left knee. Tmax 100.7, wbc down to 9.5. Wound vac placed. Likely narrow to IV Rocephin or quinolone soon. Scr 2.18>>1.91.   4/20 >> Vancomycin  >> 4/20 >> Flagyl  >>   4/20 >> Aztreonam >>  4/21: L knee synovial fluid>> 4/20 blood x2>> 4/20 urine: negative  Today 4/21: Scr 1.91, CrCl ~9mls/min   Goal of Therapy:  Vancomycin trough 10-15 mcg/ml  Eradication of infection  Plan:  -Vanc to 1750mg  q24h -flagyl 500mg  IV q8h dose ok -aztreonam 1gm IV q8h dose ok -f/u Scr closely -f/u narrow abx  Maryanna Shape, PharmD, BCPS  Clinical Pharmacist  Pager: [phone]

## 2014-12-18 NOTE — Progress Notes (Signed)
Patient is requesting foley to remain in until after PT today.

## 2014-12-18 NOTE — Progress Notes (Signed)
     Subjective:  POD#1 Irrigation and debridement of left knee. Patient reports pain as mild.  Resting comfortably in bed.  Objective:   VITALS:   Filed Vitals:   12/17/14 2129 12/18/14 0055 12/18/14 0414 12/18/14 0647  BP: 156/59 142/54 146/54   Pulse: 94 97 84   Temp: 100.2 F (37.9 C) 99.6 F (37.6 C) 100.7 F (38.2 C) 99.2 F (37.3 C)  TempSrc:    Oral  Resp: 16 16 16    Height:      Weight:      SpO2: 92% 92% 91%     Neurologically intact ABD soft Neurovascular intact Sensation intact distally Intact pulses distally Dorsiflexion/Plantar flexion intact Incision: dressing C/D/I Drain to wound of left knee  Lab Results  Component Value Date   WBC 13.0* 12/16/2014   HGB 11.1* 12/16/2014   HCT 35.3* 12/16/2014   MCV 88.0 12/16/2014   PLT 207 12/16/2014   BMET    Component Value Date/Time   NA 140 12/16/2014 0558   K 4.6 12/16/2014 0558   CL 105 12/16/2014 0558   CO2 25 12/16/2014 0558   GLUCOSE 225* 12/16/2014 0558   BUN 37* 12/16/2014 0558   CREATININE 1.90* 12/16/2014 0558   CALCIUM 8.3* 12/16/2014 0558   CALCIUM 8.1* 05/09/2014 1751   GFRNONAA 26* 12/16/2014 0558   GFRAA 30* 12/16/2014 0558     Assessment/Plan: 1 Day Post-Op   Active Problems:   Morbid obesity   Chronic diastolic heart failure   Chronic venous insufficiency   Chronic kidney disease, stage 3   DM type 2 causing CKD stage 3   Coronary atherosclerosis of native coronary artery   Sepsis   Cellulitis  Continue abx Will continue to monitor drainage to evaluate need for further wash out WBAT in the [place] 12/18/2014, 7:03 AM Cell [phone]

## 2014-12-18 NOTE — Progress Notes (Signed)
CBG came up to only 59, gave glucose gel at 0547.  Glucose is currently 107.  Patient is alert and oriented.

## 2014-12-18 NOTE — Progress Notes (Signed)
Per [facility], pt is (+) for streph group C from synovial joint of left LE, MD notified

## 2014-12-18 NOTE — Progress Notes (Signed)
Hypoglycemic Event  CBG: 45  Treatment:  Juice x2  Symptoms: None  Follow-up CBG: [id number] CBG Result:79  Possible Reasons for Event: Pt not eating at scheduled times  Comments/MD notified:yes, no new orders    Dennard Schaumann  Remember to initiate Hypoglycemia Order Set & complete

## 2014-12-18 NOTE — Progress Notes (Signed)
CBG 57, patient is alert and oriented.  Patient is asymptomatic, given snack of graham crackers and  Ensure.  Will re-check glucose.  Dressing to right thigh was changed.

## 2014-12-19 DIAGNOSIS — M009 Pyogenic arthritis, unspecified: Secondary | ICD-10-CM | POA: Diagnosis present

## 2014-12-19 DIAGNOSIS — E162 Hypoglycemia, unspecified: Secondary | ICD-10-CM

## 2014-12-19 LAB — GLUCOSE, CAPILLARY
GLUCOSE-CAPILLARY: 34 mg/dL — AB (ref 70–99)
GLUCOSE-CAPILLARY: 66 mg/dL — AB (ref 70–99)
GLUCOSE-CAPILLARY: 118 mg/dL — AB (ref 70–99)
GLUCOSE-CAPILLARY: 59 mg/dL — AB (ref 70–99)
GLUCOSE-CAPILLARY: 60 mg/dL — AB (ref 70–99)
GLUCOSE-CAPILLARY: 91 mg/dL (ref 70–99)
GLUCOSE-CAPILLARY: 49 mg/dL — AB (ref 70–99)
GLUCOSE-CAPILLARY: 48 mg/dL — AB (ref 70–99)
GLUCOSE-CAPILLARY: 47 mg/dL — AB (ref 70–99)
GLUCOSE-CAPILLARY: 45 mg/dL — AB (ref 70–99)
GLUCOSE-CAPILLARY: 72 mg/dL (ref 70–99)
GLUCOSE-CAPILLARY: 46 mg/dL — AB (ref 70–99)
Glucose-Capillary: 50 mg/dL — ABNORMAL LOW (ref 70–99)
Glucose-Capillary: 44 mg/dL — CL (ref 70–99)
Glucose-Capillary: 47 mg/dL — ABNORMAL LOW (ref 70–99)
Glucose-Capillary: 54 mg/dL — ABNORMAL LOW (ref 70–99)
Glucose-Capillary: 64 mg/dL — ABNORMAL LOW (ref 70–99)

## 2014-12-19 LAB — BODY FLUID CULTURE

## 2014-12-19 LAB — BETA-HYDROXYBUTYRIC ACID

## 2014-12-19 MED ORDER — DEXTROSE 50 % IV SOLN
25.0000 mL | Freq: Once | INTRAVENOUS | Status: AC
  Administered 2014-12-19: 25 mL via INTRAVENOUS

## 2014-12-19 MED ORDER — LEVOFLOXACIN IN D5W 750 MG/150ML IV SOLN
750.0000 mg | INTRAVENOUS | Status: DC
  Administered 2014-12-19 – 2014-12-21 (×2): 750 mg via INTRAVENOUS
  Filled 2014-12-19 (×2): qty 150

## 2014-12-19 MED ORDER — DEXTROSE 50 % IV SOLN
INTRAVENOUS | Status: AC
  Administered 2014-12-19 (×2): 25 mL
  Filled 2014-12-19: qty 50

## 2014-12-19 MED ORDER — DEXTROSE 50 % IV SOLN
INTRAVENOUS | Status: AC
  Administered 2014-12-19: 50 mL
  Filled 2014-12-19: qty 50

## 2014-12-19 MED ORDER — DEXTROSE 10 % IV SOLN
INTRAVENOUS | Status: DC
  Administered 2014-12-19: 75 mL/h via INTRAVENOUS

## 2014-12-19 MED ORDER — DEXTROSE 5 % IV SOLN
INTRAVENOUS | Status: DC
  Administered 2014-12-19: 50 mL via INTRAVENOUS

## 2014-12-19 MED ORDER — DEXTROSE 10 % IV SOLN
INTRAVENOUS | Status: DC
  Administered 2014-12-19: via INTRAVENOUS
  Administered 2014-12-20 (×2): 100 mL/h via INTRAVENOUS
  Administered 2014-12-20: 08:00:00 via INTRAVENOUS
  Administered 2014-12-21: 50 mL/h via INTRAVENOUS

## 2014-12-19 NOTE — Progress Notes (Signed)
ANTIBIOTIC CONSULT NOTE - follow up  Pharmacy Consult for Levaquin Indication: Septic arthritis   Allergies  Allergen Reactions  . Bactrim [Sulfamethoxazole-Trimethoprim] Other (See Comments)    Hyperkalemia (July 2015)  . Penicillins Hives    Has taken cephalexin 06/2014    Patient Measurements: Height: 5\' 6"  (167.6 cm) Weight: (!) 332 lb 14.3 oz (151 kg) IBW/kg (Calculated) : 59.3   Vital Signs: Temp: 97.6 F (36.4 C) (04/24 0531) Temp Source: Oral (04/24 0531) BP: 123/53 mmHg (04/24 0531) Pulse Rate: 68 (04/24 0531) Intake/Output from previous day: 04/23 0701 - 04/24 0700 In: 924.3 [P.O.:860; I.V.:64.3] Out: 660 [Urine:610; Drains:50] Intake/Output from this shift:    Labs:  Recent Labs  12/18/14 0531  WBC 9.5  HGB 10.2*  PLT 200  CREATININE 1.91*   Estimated Creatinine Clearance: 43.3 mL/min (by C-G formula based on Cr of 1.91). No results for input(s): VANCOTROUGH, VANCOPEAK, VANCORANDOM, GENTTROUGH, GENTPEAK, GENTRANDOM, TOBRATROUGH, TOBRAPEAK, TOBRARND, AMIKACINPEAK, AMIKACINTROU, AMIKACIN in the last 72 hours.   Assessment: 65 yoF with complicated PMHx on D#5 antibiotics for sepsis 2/2 septic arthritis of left knee. Afebrile, wbc down to 9.5. Wound vac placed. synovial fluid culture grew few group C strep. ID recommended to narrow antibiotics to levaquin and treat for 14 days. Scr 2.18>>1.9. Est. Crcl ~ 40-45 ml/min.  Vancomycin  4/24 >> 4/24 Flagyl  4/20 >> 4/24 Aztreonam 4/20 >> 4/24 Levaqin >> 4/24 >>  4/21: L knee synovial fluid>> few group C strep 4/20 blood x2>> ngtd 4/20 urine: negative  Goal of Therapy:  Eradication of infection  Plan:  - Levaquin 750 mg IV Q 48 hrs - Recommend IV abx for at least 7 days, then can switch to po to complete 14 day course. - Monitor renal function and adjust dose as needed.   Maryanna Shape, PharmD, BCPS  Clinical Pharmacist  Pager: [phone]

## 2014-12-19 NOTE — Progress Notes (Addendum)
Per verbal MD order, check room and bed for contraband, none found.  Patient has continued to show NO s/s of hypoglycemia.  MD aware

## 2014-12-19 NOTE — Progress Notes (Signed)
Hypoglycemic Event  CBG: 60 Treatment: juice, lunch meal  Symptoms: NONE  Follow-up CBG: Time:1300 CBG Result:91  Possible Reasons for Event: ?  Comments/MD notified:MD aware of CBG's    Dennard Schaumann  Remember to initiate Hypoglycemia Order Set & complete

## 2014-12-19 NOTE — Evaluation (Signed)
Physical Therapy Evaluation Patient Details Name: Kelly [name] MRN: [id number] DOB: 1946-05-11 Today's Date: 12/19/2014   History of Present Illness  69 year old female with past medical history of morbid obesity, hypertension, CKD stage 3, chronic diastolic CHF, DVT has IVC filter admitted with septic knee; now s/p I&D L knee with extensive lavage and synovectomy; Hospital course complicated by hypoglycemia  Clinical Impression   Patient is s/p above surgery resulting in functional limitations due to the deficits listed below (see PT Problem List).  Patient will benefit from skilled PT to increase their independence and safety with mobility to allow discharge to the venue listed below.       Follow Up Recommendations SNF    Equipment Recommendations  Wheelchair (measurements PT);Wheelchair cushion (measurements PT)    Recommendations for Other Services       Precautions / Restrictions Precautions Precautions: Fall Restrictions LLE Weight Bearing: Weight bearing as tolerated      Mobility  Bed Mobility Overal bed mobility: +2 for physical assistance;Needs Assistance Bed Mobility: Supine to Sit;Sit to Supine     Supine to sit: +2 for physical assistance;Max assist Sit to supine: +2 for physical assistance;Max assist   General bed mobility comments: Step by step cues for technique, and near constant cues to keep pt participating and to work towards sitting up and not lay herself back down; at one point, pt did lay straight back down into the bed (trunk perpendicualr to bed), and required handheld assist and support at back to sit back up; near total assist to help pt lay back down  Transfers                    Ambulation/Gait                Stairs            Wheelchair Mobility    Modified Rankin (Stroke Patients Only)       Balance Overall balance assessment: Needs assistance Sitting-balance support: Bilateral upper extremity  supported Sitting balance-Leahy Scale: Poor (approaching fair)                                       Pertinent Vitals/Pain Pain Assessment: 0-10 Pain Score: 10-Worst pain ever Pain Location: L knee with any motion Pain Descriptors / Indicators: Aching;Grimacing (yelling) Pain Intervention(s): Limited activity within patient's tolerance;Monitored during session;Patient requesting pain meds-RN notified    Home Living Family/patient expects to be discharged to:: Private residence Living Arrangements: Alone Available Help at Discharge: Family;Available PRN/intermittently Type of Home: Apartment Home Access: Level entry     Home Layout: One level Home Equipment: Hospital bed;Walker - 2 wheels;Shower seat - built in;Bedside commode;Adaptive equipment;Wheelchair - manual Additional Comments: Pt herself is aware that at current functional level we must consider SNF    Prior Function Level of Independence: Independent with assistive device(s) (as of PT eval on 11/25/14)               Hand Dominance        Extremity/Trunk Assessment   Upper Extremity Assessment: Overall WFL for tasks assessed           Lower Extremity Assessment: LLE deficits/detail (RLE limited ROM due to body habitus)   LLE Deficits / Details: Decr AROM and strength limited by pain     Communication   Communication: No difficulties  Cognition Arousal/Alertness:  Awake/alert Behavior During Therapy: WFL for tasks assessed/performed Overall Cognitive Status: Within Functional Limits for tasks assessed                      General Comments      Exercises        Assessment/Plan    PT Assessment Patient needs continued PT services  PT Diagnosis Acute pain   PT Problem List Decreased strength;Decreased range of motion;Decreased activity tolerance;Decreased balance;Decreased mobility;Decreased coordination;Decreased knowledge of use of DME;Pain  PT Treatment Interventions  DME instruction;Gait training;Functional mobility training;Patient/family education;Therapeutic activities;Therapeutic exercise   PT Goals (Current goals can be found in the Care Plan section) Acute Rehab PT Goals Patient Stated Goal: knee to get better; be able to manage at home independently PT Goal Formulation: With patient Time For Goal Achievement: 01/02/15 Potential to Achieve Goals: Fair    Frequency Min 3X/week   Barriers to discharge Decreased caregiver support      Co-evaluation               End of Session   Activity Tolerance: Patient limited by pain Patient left: in bed;with call bell/phone within reach Nurse Communication: Mobility status         Time: 3736-6815 PT Time Calculation (min) (ACUTE ONLY): 30 min   Charges:         PT G CodesQuin Hoop 12/19/2014, 2:52 PM  Roney Marion, [place] Pager [phone] Office [phone]

## 2014-12-19 NOTE — Anesthesia Preprocedure Evaluation (Addendum)
Anesthesia Evaluation  Patient identified by MRN, date of birth, ID band Patient awake    Reviewed: Allergy & Precautions, NPO status , Patient's Chart, lab work & pertinent test results  Airway Mallampati: II  TM Distance: >3 FB Neck ROM: Full    Dental no notable dental hx. (+) Poor Dentition, Dental Advidsory Given, Missing   Pulmonary sleep apnea , former smoker, PE breath sounds clear to auscultation  + decreased breath sounds      Cardiovascular hypertension, On Medications + CAD, + Peripheral Vascular Disease and +CHF Rhythm:Regular Rate:Normal     Neuro/Psych  Headaches, negative neurological ROS  negative psych ROS   GI/Hepatic negative GI ROS, Neg liver ROS, GERD-  Medicated and Controlled,  Endo/Other  diabetesMorbid obesity  Renal/GU Renal InsufficiencyRenal disease  negative genitourinary   Musculoskeletal negative musculoskeletal ROS (+)   Abdominal   Peds negative pediatric ROS (+)  Hematology  (+) anemia ,   Anesthesia Other Findings   Reproductive/Obstetrics negative OB ROS                           Anesthesia Physical Anesthesia Plan  ASA: III  Anesthesia Plan: General   Post-op Pain Management:    Induction: Intravenous  Airway Management Planned: LMA  Additional Equipment:   Intra-op Plan:   Post-operative Plan: Extubation in OR  Informed Consent: I have reviewed the patients History and Physical, chart, labs and discussed the procedure including the risks, benefits and alternatives for the proposed anesthesia with the patient or authorized representative who has indicated his/her understanding and acceptance.   Dental advisory given and Dental Advisory Given  Plan Discussed with: CRNA and Surgeon  Anesthesia Plan Comments:        Anesthesia Quick Evaluation

## 2014-12-19 NOTE — Progress Notes (Signed)
[facility] PROGRESS NOTE  ARMINE RIZZOLO HQP:[id number] DOB: November 04, 1945 DOA: 12/15/2014 PCP: Alvester Chou, NP  Brief narrative 69 year old morbidly obese female with history of diastolic CHF, lymphedema and recurrent cellulitis who was admitted to [facility] on 4/24 for sepsis secondary to septic left knee. Patient transferred to [facility] and underwent irrigation and debridement of left knee showing septic left knee joint , medial and lateral meniscus tear, extensive synovitis, and tricompartmental chondromalacia.   Assessment/Plan: Sepsis due to septic arthritis of left knee Patient underwent arthroscopic lavage of infection and extensive synovectomy. Wound VAC placed.  cx growing group C streptococcus.. D/w ID Dr Linus Salmons, recommended to narrow to levaquin for 2 weeks abx.  PT eval. Pain control with when necessary Dilaudid. WBAT on left left per ortho.  Chronic Kidney disease stage III Renal function at baseline  Chronic diastolic CHF Appears euvolemic. Continue Imdur and metoprolol.  Type 2 diabetes mellitus with hypoglycemia Noted low fsg for past 48 hrs. A1C of 6.8. Reports poor po intake. Will place on D5 drip.  Right posterior thigh pressure ulcer Seen by wound care. Recommend Silver hydrofiber for recalcitrant wound, cover with foam. Change silver hydrofiber daily.Pressure redistribution mattress.  Morbid obesity  Chronic venous insufficiency  DVT prophylaxis: Subcutaneous heparin  Diet: Heart healthy  Code Status: full code Family Communication: none at bedside Disposition Plan: needs SNF, pending PT eval   Consultants:  Orthopedics ( Dr Marcelino Scot)  Procedures:  Irrigation and debridement of left knee on 4/22 with  1. Medial menisectomy 2. Arthroscopic lavage for infection and extensive synovectomy 3. Partial lateral menisectomy 4. Chrondroplasty all three compartments and loose body removal (Left)  Antibiotics:  IV vanco , Flagyl and  aztreonam 4/21--4/24  levaquin 4/24--  HPI/Subjective: Patient seen and examined . Pain improved. Hypoglycemic past 24 hrs. Reports poor po intake  Objective: Filed Vitals:   12/19/14 0531  BP: 123/53  Pulse: 68  Temp: 97.6 F (36.4 C)  Resp: 20    Intake/Output Summary (Last 24 hours) at 12/19/14 1318 Last data filed at 12/19/14 5993  Gross per 24 hour  Intake 824.33 ml  Output    660 ml  Net 164.33 ml   Filed Weights   12/15/14 1918 12/17/14 0300 12/17/14 1205  Weight: 139.708 kg (308 lb) 151.048 kg (333 lb) 151 kg (332 lb 14.3 oz)    Exam:   General:  Morbidly obese female , no  acute distress  HEENT: moist oral mucosa, supple neck  Chest: Diminished breath sounds due to body habitus  CVS: Normal S1 and S2, no murmurs rub or gallop   GI: Soft, nondistended, nontender, bowel sounds present   Musculoskeletal: Ace wrap over left leg with wound VAC in place, chronic lymphedema  CNS: Alert and oriented   Data Reviewed: Basic Metabolic Panel:  Recent Labs Lab 12/15/14 1910 12/16/14 0558 12/18/14 0531  NA 137 140 140  K 4.5 4.6 4.1  CL 102 105 106  CO2 24 25 23   GLUCOSE 214* 225* 57*  BUN 40* 37* 42*  CREATININE 2.18* 1.90* 1.91*  CALCIUM 8.4 8.3* 7.9*  MG  --  1.7  --   PHOS  --  4.2  --    Liver Function Tests:  Recent Labs Lab 12/15/14 1910 12/16/14 0558  AST 18 18  ALT 12 11  ALKPHOS 97 90  BILITOT 0.4 0.6  PROT 8.7* 7.7  ALBUMIN 2.9* 2.4*   No results for input(s): LIPASE, AMYLASE in the last 168  hours. No results for input(s): AMMONIA in the last 168 hours. CBC:  Recent Labs Lab 12/14/14 1045 12/15/14 1910 12/16/14 0558 12/18/14 0531  WBC  --  15.2* 13.0* 9.5  NEUTROABS  --  13.3*  --   --   HGB 10.8* 12.1 11.1* 10.2*  HCT  --  38.1 35.3* 32.3*  MCV  --  88.8 88.0 89.5  PLT  --  231 207 200   Cardiac Enzymes: No results for input(s): CKTOTAL, CKMB, CKMBINDEX, TROPONINI in the last 168 hours. BNP (last 3  results)  Recent Labs  09/03/14 0407 11/23/14 1306  BNP 507.4* 194.0*    ProBNP (last 3 results)  Recent Labs  07/11/14 1959  PROBNP 2482.0*    CBG:  Recent Labs Lab 12/19/14 0538 12/19/14 0550 12/19/14 0616 12/19/14 0646 12/19/14 1118  GLUCAP 34* 50* 66* 118* 59*    Recent Results (from the past 240 hour(s))  Blood Culture (routine x 2)     Status: None (Preliminary result)   Collection Time: 12/15/14  7:00 PM  Result Value Ref Range Status   Specimen Description BLOOD LEFT ANTECUBITAL  Final   Special Requests BOTTLES DRAWN AEROBIC AND ANAEROBIC 3ML  Final   Culture   Final           BLOOD CULTURE RECEIVED NO GROWTH TO DATE CULTURE WILL BE HELD FOR 5 DAYS BEFORE ISSUING A FINAL NEGATIVE REPORT Performed at [facility]    Report Status PENDING  Incomplete  Blood Culture (routine x 2)     Status: None (Preliminary result)   Collection Time: 12/15/14  7:00 PM  Result Value Ref Range Status   Specimen Description BLOOD LEFT HAND  Final   Special Requests BOTTLES DRAWN AEROBIC ONLY 3ML  Final   Culture   Final           BLOOD CULTURE RECEIVED NO GROWTH TO DATE CULTURE WILL BE HELD FOR 5 DAYS BEFORE ISSUING A FINAL NEGATIVE REPORT Performed at [facility]    Report Status PENDING  Incomplete  Urine culture     Status: None   Collection Time: 12/15/14  9:26 PM  Result Value Ref Range Status   Specimen Description URINE, CATHETERIZED  Final   Special Requests NONE  Final   Colony Count NO GROWTH Performed at [facility]   Final   Culture NO GROWTH Performed at [facility]   Final   Report Status 12/17/2014 FINAL  Final  MRSA PCR Screening     Status: Abnormal   Collection Time: 12/16/14  3:37 AM  Result Value Ref Range Status   MRSA by PCR POSITIVE (A) NEGATIVE Final    Comment:        The GeneXpert MRSA Assay (FDA approved for NASAL specimens only), is one component of a comprehensive MRSA colonization surveillance  program. It is not intended to diagnose MRSA infection nor to guide or monitor treatment for MRSA infections. RESULT CALLED TO, READ BACK BY AND VERIFIED WITH: C. YOUNG RN AT 0615 ON 04.21.16 BY SHUEA   Body fluid culture     Status: None   Collection Time: 12/16/14 12:56 PM  Result Value Ref Range Status   Specimen Description SYNOVIAL LT KNEE  Final   Special Requests NONE  Final   Gram Stain   Final    MODERATE WBC PRESENT,BOTH PMN AND MONONUCLEAR NO ORGANISMS SEEN Performed at [facility]   Final  FEW STREPTOCOCCUS GROUP C Note: CRITICAL RESULT CALLED TO, READ BACK BY AND VERIFIED WITH: ADRIAN BULLOCK @ 4:36 PM 12/18/14 BY DWEEKS Performed at [facility]    Report Status 12/19/2014 FINAL  Final  Anaerobic culture     Status: None (Preliminary result)   Collection Time: 12/17/14  8:45 AM  Result Value Ref Range Status   Specimen Description FLUID SYNOVIAL LEFT KNEE  Final   Special Requests POF VANCOMYCIN,FLAGEL AD AZACTAM  Final   Gram Stain   Final    ABUNDANT WBC PRESENT,BOTH PMN AND MONONUCLEAR NO ORGANISMS SEEN Performed at [facility] Performed at [facility]    Culture   Final    NO ANAEROBES ISOLATED; CULTURE IN PROGRESS FOR 5 DAYS Performed at [facility]    Report Status PENDING  Incomplete  Gram stain     Status: None   Collection Time: 12/17/14  8:45 AM  Result Value Ref Range Status   Specimen Description FLUID SYNOVIAL LEFT KNEE  Final   Special Requests POF VANCOMYCIN,FLAGEL AND AZACTAM  Final   Gram Stain   Final    ABUNDANT WBC PRESENT,BOTH PMN AND MONONUCLEAR NO ORGANISMS SEEN    Report Status 12/17/2014 FINAL  Final  AFB culture with smear     Status: None (Preliminary result)   Collection Time: 12/17/14  8:45 AM  Result Value Ref Range Status   Specimen Description FLUID SYNOVIAL LEFT KNEE  Final   Special Requests POF VANCOMYCIN,FLAGEL AND AZACTAM  Final   Acid Fast Smear   Final     NO ACID FAST BACILLI SEEN Performed at [facility]    Culture   Final    CULTURE WILL BE EXAMINED FOR 6 WEEKS BEFORE ISSUING A FINAL REPORT Performed at [facility]    Report Status PENDING  Incomplete  Body fluid culture     Status: None   Collection Time: 12/17/14  8:45 AM  Result Value Ref Range Status   Specimen Description FLUID SYNOVIAL LEFT KNEE  Final   Special Requests PATIENT ON FOLLOWING VANC,FLAGEL,AZACTAM  Final   Gram Stain   Final    ABUNDANT WBC PRESENT,BOTH PMN AND MONONUCLEAR NO ORGANISMS SEEN Performed at [facility] Performed at [facility]    Culture   Final    FEW STREPTOCOCCUS GROUP C Note: CRITICAL RESULT CALLED TO, READ BACK BY AND VERIFIED WITH: ADRIAN BULLOCK @ 4:37 PM 12/18/14 BY DWEEKS Performed at [facility]    Report Status 12/19/2014 FINAL  Final     Studies: No results found.  Scheduled Meds: . colchicine  1.2 mg Oral Daily  . gabapentin  300 mg Oral TID  . insulin aspart  0-15 Units Subcutaneous TID WC  . isosorbide mononitrate  10 mg Oral BID  . metoprolol tartrate  75 mg Oral BID  . pantoprazole  40 mg Oral Daily  . polyethylene glycol  17 g Oral Daily  . senna  1 tablet Oral BID  . sodium chloride  3 mL Intravenous Q12H  . sodium chloride  3 mL Intravenous Q12H   Continuous Infusions: . dextrose 50 mL (12/19/14 0919)       Time spent: 25 minutes    Granger Chui, [place]  [facility] Pager [phone]. If 7PM-7AM, please contact night-coverage at [email or web address], password [facility] 12/19/2014, 1:18 PM  LOS: 4 days

## 2014-12-19 NOTE — Progress Notes (Signed)
Patient's CBG 44 at 1600 then 49 at 1652.  NO s/s of hypoglycemia, pt alert x4.  MD notified d/t patient has ate snacks ex; chocolate ice cream, x3 ginger ale's, peanut butter with graham crackers, applesauce between lunch and dinner.  See MAR for new orders, nsg to continue to monitor for status changes.

## 2014-12-19 NOTE — Progress Notes (Signed)
Hypoglycemic Event  CBG: 50  Treatment: D50 IV 25 mL  Symptoms: None  Follow-up CBG: CHEN:2778 CBG Result:118   Possible Reasons for Event: Unknown  Comments/MD notified:yes     Kelly [name] A  Remember to initiate Hypoglycemia Order Set & complete

## 2014-12-19 NOTE — Progress Notes (Signed)
Hypoglycemic Event  CBG: 59  Treatment: Juice, ginger ale, graham crackers  Symptoms: NONE  Follow-up CBG: Time:1150 CBG Result:60  Possible Reasons for Event: ?  Comments/MD notified:MD aware    Dennard Schaumann  Remember to initiate Hypoglycemia Order Set & complete

## 2014-12-19 NOTE — Progress Notes (Signed)
     Subjective:  POD#2 Irrigation and debridement of left knee. Patient reports pain as moderate.  She reports the pain is worse today than the past couple of days.  Resting comfortably in bed.  Objective:   VITALS:   Filed Vitals:   12/18/14 6979 12/18/14 1222 12/18/14 2028 12/19/14 0531  BP:  117/58 118/53 123/53  Pulse:   78 68  Temp: 99.2 F (37.3 C) 98.6 F (37 C) 98.4 F (36.9 C) 97.6 F (36.4 C)  TempSrc: Oral Oral Oral Oral  Resp:  18 20 20   Height:      Weight:      SpO2:  92% 98% 93%    Neurologically intact ABD soft Neurovascular intact Sensation intact distally Intact pulses distally Dorsiflexion/Plantar flexion intact Incision: dressing C/D/I Drain connected and actively draining from the left knee  Lab Results  Component Value Date   WBC 9.5 12/18/2014   HGB 10.2* 12/18/2014   HCT 32.3* 12/18/2014   MCV 89.5 12/18/2014   PLT 200 12/18/2014   BMET    Component Value Date/Time   NA 140 12/18/2014 0531   K 4.1 12/18/2014 0531   CL 106 12/18/2014 0531   CO2 23 12/18/2014 0531   GLUCOSE 57* 12/18/2014 0531   BUN 42* 12/18/2014 0531   CREATININE 1.91* 12/18/2014 0531   CALCIUM 7.9* 12/18/2014 0531   CALCIUM 8.1* 05/09/2014 1751   GFRNONAA 26* 12/18/2014 0531   GFRAA 30* 12/18/2014 0531     Assessment/Plan: 2 Days Post-Op   Active Problems:   Morbid obesity   Chronic diastolic heart failure   Chronic venous insufficiency   Chronic kidney disease, stage 3   DM type 2 causing CKD stage 3   Coronary atherosclerosis of native coronary artery   Sepsis   Cellulitis   Up with therapy WBAT in the LLE  Continue abx Heparin for DVT prophylaxis   Kelly [name] 12/19/2014, 12:30 PM Cell [phone]

## 2014-12-19 NOTE — Progress Notes (Signed)
Hypoglycemic Event  CBG: 34  Treatment: 15 GM carbohydrate snack  Symptoms: None  Follow-up CBG: Time:0550 CBG Result:50  Possible Reasons for Event: Unknown  Comments/MD notified:     Kelly [name] A  Remember to initiate Hypoglycemia Order Set & complete

## 2014-12-20 ENCOUNTER — Inpatient Hospital Stay (HOSPITAL_COMMUNITY): Payer: Medicare Other

## 2014-12-20 ENCOUNTER — Encounter (HOSPITAL_COMMUNITY): Payer: Self-pay | Admitting: Anesthesiology

## 2014-12-20 ENCOUNTER — Encounter (HOSPITAL_COMMUNITY)
Admission: EM | Disposition: A | Discharge status: Skilled Nursing Facility | Payer: Self-pay | Source: Home / Self Care | Attending: Internal Medicine

## 2014-12-20 HISTORY — PX: KNEE ARTHROSCOPY: SHX127

## 2014-12-20 LAB — GLUCOSE, CAPILLARY
GLUCOSE-CAPILLARY: 23 mg/dL — AB (ref 70–99)
GLUCOSE-CAPILLARY: 41 mg/dL — AB (ref 70–99)
GLUCOSE-CAPILLARY: 52 mg/dL — AB (ref 70–99)
GLUCOSE-CAPILLARY: 57 mg/dL — AB (ref 70–99)
GLUCOSE-CAPILLARY: 68 mg/dL — AB (ref 70–99)
GLUCOSE-CAPILLARY: 78 mg/dL (ref 70–99)
GLUCOSE-CAPILLARY: 95 mg/dL (ref 70–99)
Glucose-Capillary: 58 mg/dL — ABNORMAL LOW (ref 70–99)
Glucose-Capillary: 46 mg/dL — ABNORMAL LOW (ref 70–99)
Glucose-Capillary: 59 mg/dL — ABNORMAL LOW (ref 70–99)
Glucose-Capillary: 39 mg/dL — CL (ref 70–99)
Glucose-Capillary: 50 mg/dL — ABNORMAL LOW (ref 70–99)
Glucose-Capillary: 54 mg/dL — ABNORMAL LOW (ref 70–99)
Glucose-Capillary: 54 mg/dL — ABNORMAL LOW (ref 70–99)
Glucose-Capillary: 25 mg/dL — CL (ref 70–99)
Glucose-Capillary: 81 mg/dL (ref 70–99)
Glucose-Capillary: 49 mg/dL — ABNORMAL LOW (ref 70–99)

## 2014-12-20 LAB — BASIC METABOLIC PANEL
Anion gap: 11 (ref 5–15)
BUN: 52 mg/dL — ABNORMAL HIGH (ref 6–23)
CALCIUM: 7.4 mg/dL — AB (ref 8.4–10.5)
CHLORIDE: 98 mmol/L (ref 96–112)
CO2: 21 mmol/L (ref 19–32)
CREATININE: 1.91 mg/dL — AB (ref 0.50–1.10)
GFR, EST AFRICAN AMERICAN: 30 mL/min — AB (ref >90)
GFR, EST NON AFRICAN AMERICAN: 26 mL/min — AB (ref >90)
Glucose, Bld: 46 mg/dL — ABNORMAL LOW (ref 70–99)
POTASSIUM: 4 mmol/L (ref 3.5–5.1)
SODIUM: 130 mmol/L — AB (ref 135–145)

## 2014-12-20 LAB — CORTISOL: Cortisol, Plasma: 11.7 ug/dL

## 2014-12-20 SURGERY — ARTHROSCOPY, KNEE
Anesthesia: General | Site: Knee | Laterality: Left

## 2014-12-20 MED ORDER — PROPOFOL 10 MG/ML IV BOLUS
INTRAVENOUS | Status: AC
  Filled 2014-12-20: qty 20

## 2014-12-20 MED ORDER — PROMETHAZINE HCL 25 MG/ML IJ SOLN: 6.2500 mg | INTRAMUSCULAR | Status: DC | PRN

## 2014-12-20 MED ORDER — CHLORHEXIDINE GLUCONATE CLOTH 2 % EX PADS
6.0000 | MEDICATED_PAD | Freq: Every day | CUTANEOUS | Status: DC
  Administered 2014-12-20 – 2014-12-23 (×4): 6 via TOPICAL

## 2014-12-20 MED ORDER — HYDROCORTISONE NA SUCCINATE PF 100 MG IJ SOLR: 100.0000 mg | Freq: Three times a day (TID) | INTRAMUSCULAR | Status: DC

## 2014-12-20 MED ORDER — SODIUM CHLORIDE 0.9 % IJ SOLN
10.0000 mL | Freq: Two times a day (BID) | INTRAMUSCULAR | Status: DC
Start: 2014-12-20 — End: 2014-12-23
  Administered 2014-12-20: 10 mL
  Administered 2014-12-21: 30 mL
  Administered 2014-12-21: 20 mL
  Administered 2014-12-22: 10 mL
  Administered 2014-12-22: 20 mL
  Administered 2014-12-23: 10 mL

## 2014-12-20 MED ORDER — LACTATED RINGERS IV SOLN: INTRAVENOUS | Status: DC | PRN

## 2014-12-20 MED ORDER — GLUCOSE 40 % PO GEL
ORAL | Status: AC
  Administered 2014-12-20: 37.5 g
  Filled 2014-12-20: qty 1

## 2014-12-20 MED ORDER — FENTANYL CITRATE (PF) 250 MCG/5ML IJ SOLN
INTRAMUSCULAR | Status: AC
  Filled 2014-12-20: qty 5

## 2014-12-20 MED ORDER — MIDAZOLAM HCL 2 MG/2ML IJ SOLN
INTRAMUSCULAR | Status: AC
  Filled 2014-12-20: qty 2

## 2014-12-20 MED ORDER — MUPIROCIN 2 % EX OINT
1.0000 "application " | TOPICAL_OINTMENT | Freq: Two times a day (BID) | CUTANEOUS | Status: DC
  Administered 2014-12-20 – 2014-12-23 (×7): 1 via NASAL
  Filled 2014-12-20 (×2): qty 22

## 2014-12-20 MED ORDER — OCTREOTIDE ACETATE 50 MCG/ML IJ SOLN
50.0000 ug | Freq: Once | INTRAMUSCULAR | Status: AC
  Administered 2014-12-20: 50 ug via SUBCUTANEOUS
  Filled 2014-12-20 (×2): qty 1

## 2014-12-20 MED ORDER — OCTREOTIDE ACETATE 50 MCG/ML IJ SOLN
50.0000 ug | Freq: Four times a day (QID) | INTRAMUSCULAR | Status: DC | PRN
  Filled 2014-12-20: qty 1

## 2014-12-20 MED ORDER — ARTIFICIAL TEARS OP OINT
TOPICAL_OINTMENT | OPHTHALMIC | Status: DC | PRN
  Administered 2014-12-20: 1 via OPHTHALMIC

## 2014-12-20 MED ORDER — BUPIVACAINE-EPINEPHRINE (PF) 0.5% -1:200000 IJ SOLN
INTRAMUSCULAR | Status: AC
  Filled 2014-12-20: qty 30

## 2014-12-20 MED ORDER — SODIUM CHLORIDE 0.9 % IJ SOLN: 10.0000 mL | INTRAMUSCULAR | Status: DC | PRN

## 2014-12-20 MED ORDER — DEXTROSE 50 % IV SOLN
INTRAVENOUS | Status: AC
  Administered 2014-12-20: 50 mL
  Filled 2014-12-20: qty 50

## 2014-12-20 MED ORDER — MORPHINE SULFATE 4 MG/ML IJ SOLN
INTRAMUSCULAR | Status: AC
  Filled 2014-12-20: qty 1

## 2014-12-20 MED ORDER — LIDOCAINE HCL (CARDIAC) 20 MG/ML IV SOLN
INTRAVENOUS | Status: DC | PRN
  Administered 2014-12-20: 50 mg via INTRAVENOUS

## 2014-12-20 MED ORDER — PREDNISONE 50 MG PO TABS
50.0000 mg | ORAL_TABLET | Freq: Every day | ORAL | Status: DC
  Administered 2014-12-21: 50 mg via ORAL
  Filled 2014-12-20 (×2): qty 1

## 2014-12-20 MED ORDER — HYDRALAZINE HCL 20 MG/ML IJ SOLN: 10.0000 mg | Freq: Four times a day (QID) | INTRAMUSCULAR | Status: DC | PRN

## 2014-12-20 MED ORDER — DEXTROSE 50 % IV SOLN
INTRAVENOUS | Status: AC
  Administered 2014-12-20: 03:00:00
  Filled 2014-12-20: qty 50

## 2014-12-20 MED ORDER — DEXTROSE 50 % IV SOLN
INTRAVENOUS | Status: AC
  Administered 2014-12-20: 1 via INTRAVENOUS
  Filled 2014-12-20: qty 50

## 2014-12-20 MED ORDER — METOPROLOL TARTRATE 1 MG/ML IV SOLN: 5.0000 mg | INTRAVENOUS | Status: DC | PRN

## 2014-12-20 MED ORDER — MIDAZOLAM HCL 5 MG/5ML IJ SOLN
INTRAMUSCULAR | Status: DC | PRN
  Administered 2014-12-20 (×2): 1 mg via INTRAVENOUS

## 2014-12-20 MED ORDER — SUCCINYLCHOLINE CHLORIDE 20 MG/ML IJ SOLN
INTRAMUSCULAR | Status: DC | PRN
  Administered 2014-12-20: 100 mg via INTRAVENOUS

## 2014-12-20 MED ORDER — FENTANYL CITRATE (PF) 100 MCG/2ML IJ SOLN
INTRAMUSCULAR | Status: DC | PRN
  Administered 2014-12-20 (×3): 50 ug via INTRAVENOUS

## 2014-12-20 MED ORDER — FENTANYL CITRATE (PF) 100 MCG/2ML IJ SOLN
25.0000 ug | INTRAMUSCULAR | Status: DC | PRN
  Administered 2014-12-20: 50 ug via INTRAVENOUS

## 2014-12-20 MED ORDER — ONDANSETRON HCL 4 MG/2ML IJ SOLN
INTRAMUSCULAR | Status: DC | PRN
  Administered 2014-12-20: 4 mg via INTRAVENOUS

## 2014-12-20 MED ORDER — PROPOFOL 10 MG/ML IV BOLUS
INTRAVENOUS | Status: DC | PRN
  Administered 2014-12-20: 200 mg via INTRAVENOUS

## 2014-12-20 MED ORDER — BUPIVACAINE-EPINEPHRINE 0.5% -1:200000 IJ SOLN
INTRAMUSCULAR | Status: DC | PRN
  Administered 2014-12-20: 10 mL

## 2014-12-20 MED ORDER — SODIUM CHLORIDE 0.9 % IV SOLN: INTRAVENOUS | Status: DC

## 2014-12-20 MED ORDER — FENTANYL CITRATE (PF) 100 MCG/2ML IJ SOLN
INTRAMUSCULAR | Status: AC
  Filled 2014-12-20: qty 2

## 2014-12-20 MED ORDER — MORPHINE SULFATE 4 MG/ML IJ SOLN
INTRAMUSCULAR | Status: DC | PRN
  Administered 2014-12-20: 4 mg

## 2014-12-20 MED ORDER — SODIUM CHLORIDE 0.9 % IR SOLN
Status: DC | PRN
  Administered 2014-12-20 (×2): 3000 mL

## 2014-12-20 SURGICAL SUPPLY — 45 items
BANDAGE ELASTIC 6 VELCRO ST LF (GAUZE/BANDAGES/DRESSINGS) ×4 IMPLANT
BANDAGE ESMARK 6X9 LF (GAUZE/BANDAGES/DRESSINGS) IMPLANT
BLADE CUDA 5.5 (BLADE) IMPLANT
BLADE GREAT WHITE 4.2 (BLADE) ×2 IMPLANT
BLADE GREAT WHITE 4.2MM (BLADE) ×1
BLADE SURG 11 STRL SS (BLADE) ×3 IMPLANT
BNDG CMPR 9X6 STRL LF SNTH (GAUZE/BANDAGES/DRESSINGS)
BNDG ESMARK 6X9 LF (GAUZE/BANDAGES/DRESSINGS)
BRUSH SCRUB DISP (MISCELLANEOUS) ×6 IMPLANT
COVER SURGICAL LIGHT HANDLE (MISCELLANEOUS) ×6 IMPLANT
CUFF TOURNIQUET SINGLE 34IN LL (TOURNIQUET CUFF) IMPLANT
CUFF TOURNIQUET SINGLE 44IN (TOURNIQUET CUFF) IMPLANT
DRAPE ARTHROSCOPY W/POUCH 114 (DRAPES) ×3 IMPLANT
DRAPE U-SHAPE 47X51 STRL (DRAPES) ×3 IMPLANT
DRSG ADAPTIC 3X8 NADH LF (GAUZE/BANDAGES/DRESSINGS) ×2 IMPLANT
DRSG EMULSION OIL 3X3 NADH (GAUZE/BANDAGES/DRESSINGS) ×3 IMPLANT
DRSG PAD ABDOMINAL 8X10 ST (GAUZE/BANDAGES/DRESSINGS) ×4 IMPLANT
GAUZE SPONGE 4X4 12PLY STRL (GAUZE/BANDAGES/DRESSINGS) ×3 IMPLANT
GAUZE SPONGE 4X4 16PLY XRAY LF (GAUZE/BANDAGES/DRESSINGS) ×3 IMPLANT
GLOVE BIO SURGEON STRL SZ7.5 (GLOVE) ×3 IMPLANT
GLOVE BIO SURGEON STRL SZ8 (GLOVE) ×3 IMPLANT
GLOVE BIOGEL PI IND STRL 7.5 (GLOVE) ×1 IMPLANT
GLOVE BIOGEL PI IND STRL 8 (GLOVE) ×1 IMPLANT
GLOVE BIOGEL PI INDICATOR 7.5 (GLOVE) ×2
GLOVE BIOGEL PI INDICATOR 8 (GLOVE) ×2
GOWN STRL REUS W/ TWL LRG LVL3 (GOWN DISPOSABLE) ×2 IMPLANT
GOWN STRL REUS W/ TWL XL LVL3 (GOWN DISPOSABLE) ×1 IMPLANT
GOWN STRL REUS W/TWL LRG LVL3 (GOWN DISPOSABLE) ×6
GOWN STRL REUS W/TWL XL LVL3 (GOWN DISPOSABLE) ×3
HOVERMATT SINGLE USE (MISCELLANEOUS) ×2 IMPLANT
KIT BASIN OR (CUSTOM PROCEDURE TRAY) ×3 IMPLANT
KIT ROOM TURNOVER OR (KITS) ×3 IMPLANT
MANIFOLD NEPTUNE II (INSTRUMENTS) ×3 IMPLANT
PACK ARTHROSCOPY DSU (CUSTOM PROCEDURE TRAY) ×3 IMPLANT
PAD ARMBOARD 7.5X6 YLW CONV (MISCELLANEOUS) ×6 IMPLANT
PADDING CAST COTTON 6X4 STRL (CAST SUPPLIES) ×4 IMPLANT
SET ARTHROSCOPY TUBING (MISCELLANEOUS) ×3
SET ARTHROSCOPY TUBING LN (MISCELLANEOUS) ×1 IMPLANT
SPONGE GAUZE 4X4 12PLY STER LF (GAUZE/BANDAGES/DRESSINGS) ×2 IMPLANT
SUT ETHILON 4 0 PS 2 18 (SUTURE) ×3 IMPLANT
TOWEL OR 17X24 6PK STRL BLUE (TOWEL DISPOSABLE) ×6 IMPLANT
TUBE CONNECTING 12'X1/4 (SUCTIONS) ×1
TUBE CONNECTING 12X1/4 (SUCTIONS) ×2 IMPLANT
WAND HAND CNTRL MULTIVAC 90 (MISCELLANEOUS) IMPLANT
WATER STERILE IRR 1000ML POUR (IV SOLUTION) ×3 IMPLANT

## 2014-12-20 NOTE — Progress Notes (Signed)
PICC line is curled and unable to be used. Pt has a tiny IV in right hand but is getting abx and D10 at 100cc/hr for severe hypoglycemia. IV team has tried multiple times and this NP tried as well without success. Called PCCM for a consult for a line asap.  KJKG, NP [facility]

## 2014-12-20 NOTE — Progress Notes (Signed)
Pt has had consistant low glucose reading since 4/24 am. Pt was encourage PO intake but was NPO after MN. Since 1025-8527 pt had 10 episodes of hypoglycemic events and  4 doses of D50 has been given but only brought it up about 30 mg/dl but dropped back in 1 hour.  Bmet glucose and capillary glucose were check together with identical reading. IVF D10 was increased from 75 to 100 ml/hr at 2240 then to 125 ml/hr at 0220.  Pt is alert,oriented and asymptomatic.  VS within normal range.  NP and rapid response nurse notified. Will transfer pt to step down.

## 2014-12-20 NOTE — Progress Notes (Signed)
Peripherally Inserted Central Catheter/Midline Placement  The IV Nurse has discussed with the patient and/or persons authorized to consent for the patient, the purpose of this procedure and the potential benefits and risks involved with this procedure.  The benefits include less needle sticks, lab draws from the catheter and patient may be discharged home with the catheter.  Risks include, but not limited to, infection, bleeding, blood clot (thrombus formation), and puncture of an artery; nerve damage and irregular heat beat.  Alternatives to this procedure were also discussed.  PICC/Midline Placement Documentation  PICC / Midline Double Lumen 58/59/29 PICC Right Basilic 51 cm 0 cm (Active)  Exposed Catheter (cm) 0 cm 12/20/2014  6:50 PM  Dressing Change Due 12/27/14 12/20/2014  6:50 PM       Finneus Kaneshiro, Maricela Bo 12/20/2014, 6:53 PM

## 2014-12-20 NOTE — Progress Notes (Signed)
Pt hypoglycemic post-op 23 and 25. One amp d50 given. Re-check after 30 minutes CBG 81.  Dr Oletta Lamas, anesthesia aware. Advised that  Dr Handy/hospitalist be made aware. Lanny Hurst, PA-C (ortho) notified. Advised medicine needed to be made aware.

## 2014-12-20 NOTE — Anesthesia Postprocedure Evaluation (Signed)
  Anesthesia Post-op Note  Patient: Kelly [name]  Procedure(s) Performed: Procedure(s): LEFT KNEE ARTHROSCOPY (Left)  Patient Location: PACU  Anesthesia Type:General  Level of Consciousness: awake  Airway and Oxygen Therapy: Patient Spontanous Breathing  Post-op Pain: mild  Post-op Assessment: Post-op Vital signs reviewed  Post-op Vital Signs: Reviewed  Last Vitals:  Filed Vitals:   12/20/14 1545  BP: 144/78  Pulse:   Temp:   Resp: 18    Complications: No apparent anesthesia complications

## 2014-12-20 NOTE — Anesthesia Procedure Notes (Signed)
Procedure Name: Intubation Date/Time: 12/20/2014 1:37 PM Performed by: Neldon Newport Pre-anesthesia Checklist: Patient identified, Timeout performed, Emergency Drugs available, Suction available and Patient being monitored Patient Re-evaluated:Patient Re-evaluated prior to inductionOxygen Delivery Method: Circle system utilized Preoxygenation: Pre-oxygenation with 100% oxygen Intubation Type: IV induction and Rapid sequence Ventilation: Mask ventilation without difficulty Laryngoscope Size: Mac and 3 Grade View: Grade I Tube type: Oral Tube size: 7.0 mm Number of attempts: 1 Placement Confirmation: positive ETCO2,  ETT inserted through vocal cords under direct vision and breath sounds checked- equal and bilateral Secured at: 22 cm Tube secured with: Tape Dental Injury: Teeth and Oropharynx as per pre-operative assessment

## 2014-12-20 NOTE — Progress Notes (Signed)
Dear Doctor: Clementeen Graham This patient has been identified as a candidate for PICC for the following reason (s): IV therapy over 48 hours and drug pH or osmolality (causing phlebitis, infiltration in 24 hours) If you agree, please write an order for the indicated device. For any questions contact the Vascular Access Team at [phone] if no answer, please leave a message.  Thank you for supporting the early vascular access assessment program.

## 2014-12-20 NOTE — Progress Notes (Signed)
At bedside, nurse consult to D/C R PICC but there is no MD order. Also, R IJ has been successfully placed, but appears to go right through the looped portion of the PICC. Concern expressed to RN Jody about pulling PICC and possibly malpositioning the R IJ. Recommend CXR after D/C PICC to reassess position of R IJ. Jody RN to page MD, will await orders.

## 2014-12-20 NOTE — Progress Notes (Signed)
R PICC per CXR is looped in the subclavian vein. Spoke with floor RN Fernand Parkins, to refer pt to IR.

## 2014-12-20 NOTE — Progress Notes (Signed)
5th dose D50  given 0650for glucose of 39. Report given to Jody Rn at [facility] , pt transferred to [phone]

## 2014-12-20 NOTE — Brief Op Note (Signed)
12/15/2014 - 12/20/2014  4:06 PM  PATIENT:  Kelly [name]  69 y.o. female  PRE-OPERATIVE DIAGNOSIS:  septic left knee  POST-OPERATIVE DIAGNOSIS:  septic left knee  PROCEDURE:  Procedure(s): LEFT KNEE ARTHROSCOPY (Left) lavage and debridement with partial synovectomy  SURGEON:  Surgeon(s) and Role:    * Altamese Oden, MD - Primary  PHYSICIAN ASSISTANT: Ainsley Spinner, PA-C  ANESTHESIA:   general  I/O:  Total I/O In: 1677.9 [I.V.:1677.9] Out: 810 [Urine:810]  SPECIMEN:  No Specimen  TOURNIQUET:  * No tourniquets in log *  DICTATION: .Other Dictation: Dictation Number [id number]

## 2014-12-20 NOTE — Progress Notes (Signed)
Patients blood sugar has remained in 50's apple juice given this morning while talking to orthopedic PA, patient has been alert, d10 IV infusion has been running, physicians aware of low CBG.  Patient is currently being transported to OR via bed, report given to holding area.  Will resume care once she returns to floor.

## 2014-12-20 NOTE — Care Management Note (Addendum)
    Page 1 of 1   12/23/2014     11:43:48 AM CARE MANAGEMENT NOTE 12/23/2014  Patient:  Kelly [name], Kelly [name]   Account Number:  [id number]  Date Initiated:  12/20/2014  Documentation initiated by:  Elissa Hefty  Subjective/Objective Assessment:   adm w septic arthritis of knee     Action/Plan:   lives alone, pcp dr Alvester Chou   Anticipated DC Date:     Anticipated DC Plan:  HOME W HOME HEALTH SERVICES  In-house referral  Clinical Social Worker         [facility] Choice  Resumption Of Svcs/PTA Provider   Choice offered to / List presented to:          [facility] arranged  [place]   Status of service:  Completed, signed off Medicare Important Message given?  YES (If response is "NO", the following Medicare IM given date fields will be blank) Date Medicare IM given:  12/20/2014 Medicare IM given by:  Elissa Hefty Date Additional Medicare IM given:  12/23/2014 Additional Medicare IM given by:  Sharnita Bogucki [facility]  Discharge Disposition:  [place]  Per UR Regulation:  Reviewed for med. necessity/level of care/duration of stay  If discussed at [place] of Stay Meetings, dates discussed:   12/21/2014    Comments:  3729 12-23-14 Jacqlyn Krauss, RN,BSN [phone] Plan for d/c to SNF. No further needs from CM at this time.

## 2014-12-20 NOTE — Transfer of Care (Signed)
Immediate Anesthesia Transfer of Care Note  Patient: Kelly [name]  Procedure(s) Performed: Procedure(s): LEFT KNEE ARTHROSCOPY (Left)  Patient Location: PACU  Anesthesia Type:General  Level of Consciousness: awake, alert  and oriented  Airway & Oxygen Therapy: Patient Spontanous Breathing and Patient connected to nasal cannula oxygen  Post-op Assessment: Report given to RN and Patient moving all extremities  Post vital signs: Reviewed and stable, glucose low - Dr. Oletta Lamas notified  Last Vitals:  Filed Vitals:   12/20/14 0800  BP:   Pulse: 78  Temp: 37.1 C  Resp:    BP 660/63, Sats 01% Complications: No apparent anesthesia complications

## 2014-12-20 NOTE — Progress Notes (Signed)
[facility] Progress Note  Subjective  Transferred to SDU due to bouts of hypoglycemia No real change by pt report Pain still significant in L knee   Review of Systems  Constitutional: Negative for fever and chills.  Respiratory: Negative for shortness of breath and wheezing.   Cardiovascular: Negative for chest pain and palpitations.  Gastrointestinal: Negative for nausea and vomiting.  Musculoskeletal: Positive for joint pain (L knee pain ).  Neurological: Negative for headaches.     Objective   BP 139/65 mmHg  Pulse 83  Temp(Src) 99.2 F (37.3 C) (Oral)  Resp 20  Ht 5' 6"  (1.676 m)  Wt 151 kg (332 lb 14.3 oz)  BMI 53.76 kg/m2  SpO2 99%  Intake/Output      04/24 0701 - 04/25 0700 04/25 0701 - 04/26 0700   P.O. 840    I.V. (mL/kg) 421.7 (2.8)    IV Piggyback 150    Total Intake(mL/kg) 1411.7 (9.3)    Urine (mL/kg/hr) 1600 (0.4)    Drains 0 (0)    Stool 0 (0)    Total Output 1600     Net -188.3          Stool Occurrence 1 x      Labs  Results for CHENELLE, BENNING (MRN [id number]) as of 12/20/2014 09:08  Ref. Range 12/20/2014 04:03  Sodium Latest Ref Range: 135-145 mmol/L 130 (L)  Potassium Latest Ref Range: 3.5-5.1 mmol/L 4.0  Chloride Latest Ref Range: 96-112 mmol/L 98  CO2 Latest Ref Range: 19-32 mmol/L 21  BUN Latest Ref Range: 6-23 mg/dL 52 (H)  Creatinine Latest Ref Range: 0.50-1.10 mg/dL 1.91 (H)  Calcium Latest Ref Range: 8.4-10.5 mg/dL 7.4 (L)  EGFR (Non-African Amer.) Latest Ref Range: >90 mL/min 26 (L)  EGFR (African American) Latest Ref Range: >90 mL/min 30 (L)  Glucose Latest Ref Range: 70-99 mg/dL 46 (L)  Anion gap Latest Ref Range: 5-15  11   CBG (last 3)   Recent Labs  12/20/14 0407 12/20/14 0515 12/20/14 0620  GLUCAP 46* 59* 39*      Exam  Gen: lying in bed, appears comfortable, NAD  Ext:       Left Lower Extremity   Drain in place   Bloody drainage in canister   Cloudy fluid noted in hose   Significant  knee pain with gentle manipulation during dressing change  Distal motor and sensory functions intact distally   Swelling stable distally   Ext warm  + DP pulse    Labs   Body fluid culture FEW STREPTOCOCCUS GROUP C   Assessment and Plan   POD/HD#: 64   69 y/o female s/p I&D septic L knee  1. Septic L knee s/p I&D  Given clinical findings will return to OR for repeat I&D  Continue with abx per ID recs  2. Hypoglycemia  Per IM   3. DM  Per IM   4. Dispo  Return to OR this afternoon for repeat I&D     Jari Pigg, PA-C [facility] [phone] (O) 12/20/2014 9:06 AM

## 2014-12-20 NOTE — Progress Notes (Signed)
[facility] PROGRESS NOTE  Kelly [name] QAS:[id number] DOB: 05/31/1946 DOA: 12/15/2014 PCP: Alvester Chou, NP  Brief narrative 69 year old morbidly obese female with history of diastolic CHF, lymphedema and recurrent cellulitis who was admitted to [facility] on 4/24 for sepsis secondary to septic left knee. Patient transferred to [place] and underwent irrigation and debridement of left knee showing septic left knee joint , medial and lateral meniscus tear, extensive synovitis, and tricompartmental chondromalacia. Patient having persistent hypoglycemia for past 48 hrs and transferred to stepdown.   Assessment/Plan:  persistent hypoglycemia in type 2 DM Pt persistently hypoglycemic for past 48 hrs with fsg <50. Required almost 5 amps of D50 overnight and placed on D10 despite which she is hypoglycemic. Pt remains asymptomatic. Not on any medications at home for DM. Concerned if she received sulfonylurea accidentally in the past 48 hrs. B-oh butarate level low , recent TSH wnl. Am cortisol wnl. Check C-peptide, insulin ab, pro insulin/ insulin ratio, sulfonylurea panel .check MRI abdomen to r/o insulinoma. -place on octeotride 50 mg sq q 6hr prn for hypoglycemia. Add oral prednisone 50 gm daily.   Sepsis due to septic arthritis of left knee Patient underwent arthroscopic lavage of infection and extensive synovectomy. Wound VAC placed.  cx growing group C streptococcus.. D/w ID Dr Linus Salmons, recommended to narrow to levaquin for 2 weeks abx.  PT eval. Pain control with when necessary Dilaudid. Repeat lavage and debridement with partial synovectomy done today. WBAT on left left per ortho.   Chronic Kidney disease stage III Renal function at baseline  Chronic diastolic CHF Appears euvolemic. Continue Imdur and metoprolol.    Right posterior thigh pressure ulcer Seen by wound care. Recommend Silver hydrofiber for recalcitrant wound, cover with foam. Change silver hydrofiber  daily.Pressure redistribution mattress.  Morbid obesity  Chronic venous insufficiency  Poor IV access  ordered PICC line  DVT prophylaxis: Subcutaneous heparin  Diet: Heart healthy  Code Status: full code Family Communication: none at bedside Disposition Plan: needs SNF, pending PT eval   Consultants: Orthopedics ( Dr Marcelino Scot)  Procedures:  Irrigation and debridement of left knee on 4/22 with  1. Medial menisectomy 2. Arthroscopic lavage for infection and extensive synovectomy 3. Partial lateral menisectomy 4. Chrondroplasty all three compartments and loose body removal (Left)  Lavage and debridement with partial synovectomy left knee.  Antibiotics:  IV vanco , Flagyl and aztreonam 4/21--4/24  levaquin 4/24--  HPI/Subjective: Patient seen and examined . Persistent hypoglycemia requring severe amps of d50. Pt hypoglycemic despite being on D10 drip. asymptomatic.  Objective: Filed Vitals:   12/20/14 1545  BP: 144/78  Pulse:   Temp:   Resp: 18    Intake/Output Summary (Last 24 hours) at 12/20/14 1622 Last data filed at 12/20/14 1420  Gross per 24 hour  Intake 2099.59 ml  Output   1860 ml  Net 239.59 ml   Filed Weights   12/15/14 1918 12/17/14 0300 12/17/14 1205  Weight: 139.708 kg (308 lb) 151.048 kg (333 lb) 151 kg (332 lb 14.3 oz)    Exam:   General:  Morbidly obese female , no  acute distress  HEENT: moist oral mucosa, supple neck  Chest: Diminished breath sounds due to body habitus  CVS: Normal S1 and S2, no murmurs rub or gallop   GI: Soft, nondistended, nontender, bowel sounds present   Musculoskeletal: Ace wrap over left leg with wound VAC in place,   CNS: Alert and oriented   Data Reviewed: Basic Metabolic Panel:  Recent  Labs Lab 12/15/14 1910 12/16/14 0558 12/18/14 0531 12/20/14 0403  NA 137 140 140 130*  K 4.5 4.6 4.1 4.0  CL 102 105 106 98  CO2 24 25 23 21   GLUCOSE 214* 225* 57* 46*  BUN 40* 37* 42* 52*  CREATININE  2.18* 1.90* 1.91* 1.91*  CALCIUM 8.4 8.3* 7.9* 7.4*  MG  --  1.7  --   --   PHOS  --  4.2  --   --    Liver Function Tests:  Recent Labs Lab 12/15/14 1910 12/16/14 0558  AST 18 18  ALT 12 11  ALKPHOS 97 90  BILITOT 0.4 0.6  PROT 8.7* 7.7  ALBUMIN 2.9* 2.4*   No results for input(s): LIPASE, AMYLASE in the last 168 hours. No results for input(s): AMMONIA in the last 168 hours. CBC:  Recent Labs Lab 12/14/14 1045 12/15/14 1910 12/16/14 0558 12/18/14 0531  WBC  --  15.2* 13.0* 9.5  NEUTROABS  --  13.3*  --   --   HGB 10.8* 12.1 11.1* 10.2*  HCT  --  38.1 35.3* 32.3*  MCV  --  88.8 88.0 89.5  PLT  --  231 207 200   Cardiac Enzymes: No results for input(s): CKTOTAL, CKMB, CKMBINDEX, TROPONINI in the last 168 hours. BNP (last 3 results)  Recent Labs  09/03/14 0407 11/23/14 1306  BNP 507.4* 194.0*    ProBNP (last 3 results)  Recent Labs  07/11/14 1959  PROBNP 2482.0*    CBG:  Recent Labs Lab 12/20/14 1018 12/20/14 1136 12/20/14 1455 12/20/14 1457 12/20/14 1517  GLUCAP 52* 54* 23* 25* 81    Recent Results (from the past 240 hour(s))  Blood Culture (routine x 2)     Status: None (Preliminary result)   Collection Time: 12/15/14  7:00 PM  Result Value Ref Range Status   Specimen Description BLOOD LEFT ANTECUBITAL  Final   Special Requests BOTTLES DRAWN AEROBIC AND ANAEROBIC 3ML  Final   Culture   Final           BLOOD CULTURE RECEIVED NO GROWTH TO DATE CULTURE WILL BE HELD FOR 5 DAYS BEFORE ISSUING A FINAL NEGATIVE REPORT Performed at [facility]    Report Status PENDING  Incomplete  Blood Culture (routine x 2)     Status: None (Preliminary result)   Collection Time: 12/15/14  7:00 PM  Result Value Ref Range Status   Specimen Description BLOOD LEFT HAND  Final   Special Requests BOTTLES DRAWN AEROBIC ONLY 3ML  Final   Culture   Final           BLOOD CULTURE RECEIVED NO GROWTH TO DATE CULTURE WILL BE HELD FOR 5 DAYS BEFORE ISSUING A  FINAL NEGATIVE REPORT Performed at [facility]    Report Status PENDING  Incomplete  Urine culture     Status: None   Collection Time: 12/15/14  9:26 PM  Result Value Ref Range Status   Specimen Description URINE, CATHETERIZED  Final   Special Requests NONE  Final   Colony Count NO GROWTH Performed at [facility]   Final   Culture NO GROWTH Performed at [facility]   Final   Report Status 12/17/2014 FINAL  Final  MRSA PCR Screening     Status: Abnormal   Collection Time: 12/16/14  3:37 AM  Result Value Ref Range Status   MRSA by PCR POSITIVE (A) NEGATIVE Final    Comment:  The GeneXpert MRSA Assay (FDA approved for NASAL specimens only), is one component of a comprehensive MRSA colonization surveillance program. It is not intended to diagnose MRSA infection nor to guide or monitor treatment for MRSA infections. RESULT CALLED TO, READ BACK BY AND VERIFIED WITH: C. YOUNG RN AT 0615 ON 04.21.16 BY SHUEA   Body fluid culture     Status: None   Collection Time: 12/16/14 12:56 PM  Result Value Ref Range Status   Specimen Description SYNOVIAL LT KNEE  Final   Special Requests NONE  Final   Gram Stain   Final    MODERATE WBC PRESENT,BOTH PMN AND MONONUCLEAR NO ORGANISMS SEEN Performed at [facility]    Culture   Final    FEW STREPTOCOCCUS GROUP C Note: CRITICAL RESULT CALLED TO, READ BACK BY AND VERIFIED WITH: ADRIAN BULLOCK @ 4:36 PM 12/18/14 BY DWEEKS Performed at [facility]    Report Status 12/19/2014 FINAL  Final  Anaerobic culture     Status: None (Preliminary result)   Collection Time: 12/17/14  8:45 AM  Result Value Ref Range Status   Specimen Description FLUID SYNOVIAL LEFT KNEE  Final   Special Requests POF VANCOMYCIN,FLAGEL AD AZACTAM  Final   Gram Stain   Final    ABUNDANT WBC PRESENT,BOTH PMN AND MONONUCLEAR NO ORGANISMS SEEN Performed at [facility] Performed at [facility]    Culture    Final    NO ANAEROBES ISOLATED; CULTURE IN PROGRESS FOR 5 DAYS Performed at [facility]    Report Status PENDING  Incomplete  Gram stain     Status: None   Collection Time: 12/17/14  8:45 AM  Result Value Ref Range Status   Specimen Description FLUID SYNOVIAL LEFT KNEE  Final   Special Requests POF VANCOMYCIN,FLAGEL AND AZACTAM  Final   Gram Stain   Final    ABUNDANT WBC PRESENT,BOTH PMN AND MONONUCLEAR NO ORGANISMS SEEN    Report Status 12/17/2014 FINAL  Final  AFB culture with smear     Status: None (Preliminary result)   Collection Time: 12/17/14  8:45 AM  Result Value Ref Range Status   Specimen Description FLUID SYNOVIAL LEFT KNEE  Final   Special Requests POF VANCOMYCIN,FLAGEL AND AZACTAM  Final   Acid Fast Smear   Final    NO ACID FAST BACILLI SEEN Performed at [facility]    Culture   Final    CULTURE WILL BE EXAMINED FOR 6 WEEKS BEFORE ISSUING A FINAL REPORT Performed at [facility]    Report Status PENDING  Incomplete  Body fluid culture     Status: None   Collection Time: 12/17/14  8:45 AM  Result Value Ref Range Status   Specimen Description FLUID SYNOVIAL LEFT KNEE  Final   Special Requests PATIENT ON FOLLOWING VANC,FLAGEL,AZACTAM  Final   Gram Stain   Final    ABUNDANT WBC PRESENT,BOTH PMN AND MONONUCLEAR NO ORGANISMS SEEN Performed at [facility] Performed at [facility]    Culture   Final    FEW STREPTOCOCCUS GROUP C Note: CRITICAL RESULT CALLED TO, READ BACK BY AND VERIFIED WITH: ADRIAN BULLOCK @ 4:37 PM 12/18/14 BY DWEEKS Performed at [facility]    Report Status 12/19/2014 FINAL  Final     Studies: No results found.  Scheduled Meds: . Chlorhexidine Gluconate Cloth  6 each Topical Q0600  . colchicine  1.2 mg Oral Daily  . fentaNYL      .  gabapentin  300 mg Oral TID  . isosorbide mononitrate  10 mg Oral BID  . levofloxacin (LEVAQUIN) IV  750 mg Intravenous Q48H  . mupirocin ointment  1  application Nasal BID  . pantoprazole  40 mg Oral Daily  . polyethylene glycol  17 g Oral Daily  . senna  1 tablet Oral BID  . sodium chloride  3 mL Intravenous Q12H  . sodium chloride  3 mL Intravenous Q12H   Continuous Infusions: . sodium chloride    . dextrose 100 mL/hr (12/20/14 0800)       Time spent: 25 minutes    Travonna Swindle, [place]  [facility] Pager [phone]. If 7PM-7AM, please contact night-coverage at [email or web address], password [facility] 12/20/2014, 4:22 PM  LOS: 5 days

## 2014-12-20 NOTE — Procedures (Signed)
Central Venous Catheter Insertion Procedure Note YENTY BLOCH [id number] 01-10-1946  Procedure: Insertion of Central Venous Catheter Indications: Assessment of intravascular volume, Drug and/or fluid administration and Frequent blood sampling  Procedure Details Consent: Risks of procedure as well as the alternatives and risks of each were explained to the (patient/caregiver).  Consent for procedure obtained. Time Out: Verified patient identification, verified procedure, site/side was marked, verified correct patient position, special equipment/implants available, medications/allergies/relevent history reviewed, required imaging and test results available.  Performed  Maximum sterile technique was used including antiseptics, cap, gloves, gown, hand hygiene, mask and sheet. Skin prep: Chlorhexidine; local anesthetic administered A antimicrobial bonded/coated triple lumen catheter was placed in the right internal jugular vein using the Seldinger technique.  Evaluation Blood flow good Complications: No apparent complications Patient did tolerate procedure well. Chest X-ray ordered to verify placement.  CXR: pending.  Procedure performed under direct ultrasound guidance for real time vessel cannulation.      Montey Hora, [place] [facility] Pager: [phone]  or [phone] 12/20/2014, 9:41 PM

## 2014-12-21 ENCOUNTER — Encounter (HOSPITAL_COMMUNITY): Payer: Self-pay | Admitting: Orthopedic Surgery

## 2014-12-21 ENCOUNTER — Inpatient Hospital Stay (HOSPITAL_COMMUNITY): Admission: RE | Admit: 2014-12-21 | Payer: Medicare Other | Source: Ambulatory Visit

## 2014-12-21 LAB — BASIC METABOLIC PANEL
Anion gap: 10 (ref 5–15)
BUN: 40 mg/dL — AB (ref 6–23)
CO2: 23 mmol/L (ref 19–32)
CREATININE: 1.5 mg/dL — AB (ref 0.50–1.10)
Calcium: 8.2 mg/dL — ABNORMAL LOW (ref 8.4–10.5)
Chloride: 100 mmol/L (ref 96–112)
GFR calc non Af Amer: 35 mL/min — ABNORMAL LOW (ref >90)
GFR, EST AFRICAN AMERICAN: 40 mL/min — AB (ref >90)
GLUCOSE: 448 mg/dL — AB (ref 70–99)
Potassium: 5.4 mmol/L — ABNORMAL HIGH (ref 3.5–5.1)
Sodium: 133 mmol/L — ABNORMAL LOW (ref 135–145)

## 2014-12-21 LAB — GLUCOSE, CAPILLARY
GLUCOSE-CAPILLARY: 289 mg/dL — AB (ref 70–99)
GLUCOSE-CAPILLARY: 403 mg/dL — AB (ref 70–99)
GLUCOSE-CAPILLARY: 96 mg/dL (ref 70–99)
Glucose-Capillary: 115 mg/dL — ABNORMAL HIGH (ref 70–99)
Glucose-Capillary: 168 mg/dL — ABNORMAL HIGH (ref 70–99)
Glucose-Capillary: 196 mg/dL — ABNORMAL HIGH (ref 70–99)
Glucose-Capillary: 205 mg/dL — ABNORMAL HIGH (ref 70–99)
Glucose-Capillary: 262 mg/dL — ABNORMAL HIGH (ref 70–99)
Glucose-Capillary: 313 mg/dL — ABNORMAL HIGH (ref 70–99)
Glucose-Capillary: 430 mg/dL — ABNORMAL HIGH (ref 70–99)
Glucose-Capillary: 429 mg/dL — ABNORMAL HIGH (ref 70–99)
Glucose-Capillary: 441 mg/dL — ABNORMAL HIGH (ref 70–99)

## 2014-12-21 LAB — C-PEPTIDE: C-Peptide: 9.8 ng/mL — ABNORMAL HIGH (ref 1.1–4.4)

## 2014-12-21 MED ORDER — FUROSEMIDE 40 MG PO TABS
40.0000 mg | ORAL_TABLET | Freq: Two times a day (BID) | ORAL | Status: DC
  Administered 2014-12-21 – 2014-12-23 (×5): 40 mg via ORAL
  Filled 2014-12-21 (×6): qty 1

## 2014-12-21 MED ORDER — SODIUM POLYSTYRENE SULFONATE 15 GM/60ML PO SUSP
15.0000 g | Freq: Once | ORAL | Status: AC
  Administered 2014-12-21: 15 g via ORAL
  Filled 2014-12-21: qty 60

## 2014-12-21 MED ORDER — INSULIN ASPART 100 UNIT/ML ~~LOC~~ SOLN
0.0000 [IU] | Freq: Three times a day (TID) | SUBCUTANEOUS | Status: DC
  Administered 2014-12-22: 5 [IU] via SUBCUTANEOUS

## 2014-12-21 MED ORDER — INSULIN ASPART 100 UNIT/ML ~~LOC~~ SOLN: 0.0000 [IU] | Freq: Every day | SUBCUTANEOUS | Status: DC

## 2014-12-21 MED ORDER — INSULIN ASPART 100 UNIT/ML ~~LOC~~ SOLN
7.0000 [IU] | Freq: Once | SUBCUTANEOUS | Status: AC
  Administered 2014-12-21: 7 [IU] via SUBCUTANEOUS

## 2014-12-21 MED ORDER — METOPROLOL TARTRATE 25 MG PO TABS
75.0000 mg | ORAL_TABLET | Freq: Two times a day (BID) | ORAL | Status: DC
  Administered 2014-12-21 – 2014-12-23 (×5): 75 mg via ORAL
  Filled 2014-12-21 (×8): qty 1

## 2014-12-21 NOTE — Progress Notes (Addendum)
[facility] PROGRESS NOTE  Kelly [name] ION:[id number] DOB: 06-May-1946 DOA: 12/15/2014 PCP: Alvester Chou, NP  Brief narrative 69 year old morbidly obese female with history of diastolic CHF, lymphedema and recurrent cellulitis who was admitted to [facility] on 4/24 for sepsis secondary to septic left knee. Patient transferred to [place] and underwent irrigation and debridement of left knee showing septic left knee joint , medial and lateral meniscus tear, extensive synovitis, and tricompartmental chondromalacia. On 4/25 patient was transferred to stepdown for persistent hypoglycemia of 48 hour duration.   Assessment/Plan:  persistent hypoglycemia in type 2 DM Persistent hypoglycemia requiring several amps of D50 and continuous D10 drip. Not on any medications at home for DM. Concerned if she received sulfonylurea accidentally during hospitalization. B- hydroxy butarate level low , recent TSH wnl. Am cortisol wnl. Ordered C-peptide, insulin ab, pro insulin/ insulin ratio, sulfonylurea panel . MRI abdomen to r/o insulinoma but given her size and IVC finter could not be obtained. Even no other associated signs or symptoms, insulinoma is unlikely. -placed on octeotride 50 mg sq q 6hr prn for hypoglycemia along with 50 mg order prednisone daily with good clinical response.  Sepsis due to septic arthritis of left knee -Patient underwent arthroscopic lavage of infection and extensive synovectomy.  -cx growing group C streptococcus.. D/w ID Dr Linus Salmons, recommended to narrow to levaquin for 2 weeks abx.  -Repeat lavage and debridement with partial synovectomy done on 4/25. Wound VAC removed. -WBAT on left left per ortho. PT eval recommends skilled nursing facility. Social work consulted. - Pain control with when necessary Dilaudid and Vicodin. -disontinue Foley prior to discharge.    Chronic Kidney disease stage III Renal function at baseline  Chronic diastolic CHF Appears  euvolemic. Continue Imdur and metoprolol.    Right posterior thigh pressure ulcer Seen by wound care. Recommend Silver hydrofiber for recalcitrant wound, cover with foam. Change silver hydrofiber daily.Pressure redistribution mattress.  Uncontrolled hypertension Resume metoprolol and Lasix. Will place on when necessary hydralazine.  Morbid obesity  Chronic venous insufficiency  Poor IV access PICC line placed on 4/25 with coiling. Right IJ was then placed by PC CM followed by removal of the PICC line.  DVT prophylaxis: Subcutaneous heparin  Diet: Heart healthy  Code Status: full code Family Communication:  Niece ( teddy)  at bedside Disposition Plan: SNF possibly tomorrow if hypoglycemia resolves   Consultants: Orthopedics ( Dr Marcelino Scot)  Procedures:  Irrigation and debridement of left knee on 4/22 with  1. Medial menisectomy 2. Arthroscopic lavage for infection and extensive synovectomy 3. Partial lateral menisectomy 4. Chrondroplasty all three compartments and loose body removal (Left)  Lavage and debridement with partial synovectomy left knee.  Antibiotics:  IV vanco , Flagyl and aztreonam 4/21--4/24  levaquin 4/24--until 5/8  HPI/Subjective: Patient seen and examined . Hypoglycemia improved after adding octreotide and oral prednisone. PICC line placed for poor IV access but was found to be quitting so right IJ was placed by PCCM. PICC line is removed this morning.. Patient informs feeling better.   Objective: Filed Vitals:   12/21/14 1202  BP: 176/71  Pulse: 73  Temp: 97.9 F (36.6 C)  Resp: 13    Intake/Output Summary (Last 24 hours) at 12/21/14 1343 Last data filed at 12/21/14 1200  Gross per 24 hour  Intake 1338.33 ml  Output   4720 ml  Net -3381.67 ml   Filed Weights   12/15/14 1918 12/17/14 0300 12/17/14 1205  Weight: 139.708 kg (308 lb) 151.048 kg (  333 lb) 151 kg (332 lb 14.3 oz)    Exam:   General:  Morbidly obese female , no  acute  distress  HEENT: moist oral mucosa, supple neck  Chest: Diminished breath sounds due to body habitus  CVS: Normal S1 and S2, no murmurs rub or gallop   GI: Soft, nondistended, nontender, bowel sounds present   Musculoskeletal: Ace wrap over left leg with wound VAC in place,   CNS: Alert and oriented   Data Reviewed: Basic Metabolic Panel:  Recent Labs Lab 12/15/14 1910 12/16/14 0558 12/18/14 0531 12/20/14 0403  NA 137 140 140 130*  K 4.5 4.6 4.1 4.0  CL 102 105 106 98  CO2 24 25 23 21   GLUCOSE 214* 225* 57* 46*  BUN 40* 37* 42* 52*  CREATININE 2.18* 1.90* 1.91* 1.91*  CALCIUM 8.4 8.3* 7.9* 7.4*  MG  --  1.7  --   --   PHOS  --  4.2  --   --    Liver Function Tests:  Recent Labs Lab 12/15/14 1910 12/16/14 0558  AST 18 18  ALT 12 11  ALKPHOS 97 90  BILITOT 0.4 0.6  PROT 8.7* 7.7  ALBUMIN 2.9* 2.4*   No results for input(s): LIPASE, AMYLASE in the last 168 hours. No results for input(s): AMMONIA in the last 168 hours. CBC:  Recent Labs Lab 12/15/14 1910 12/16/14 0558 12/18/14 0531  WBC 15.2* 13.0* 9.5  NEUTROABS 13.3*  --   --   HGB 12.1 11.1* 10.2*  HCT 38.1 35.3* 32.3*  MCV 88.8 88.0 89.5  PLT 231 207 200   Cardiac Enzymes: No results for input(s): CKTOTAL, CKMB, CKMBINDEX, TROPONINI in the last 168 hours. BNP (last 3 results)  Recent Labs  09/03/14 0407 11/23/14 1306  BNP 507.4* 194.0*    ProBNP (last 3 results)  Recent Labs  07/11/14 1959  PROBNP 2482.0*    CBG:  Recent Labs Lab 12/20/14 2042 12/20/14 2341 12/21/14 0120 12/21/14 0414 12/21/14 0750  GLUCAP 68* 96 115* 168* 205*    Recent Results (from the past 240 hour(s))  Blood Culture (routine x 2)     Status: None (Preliminary result)   Collection Time: 12/15/14  7:00 PM  Result Value Ref Range Status   Specimen Description BLOOD LEFT ANTECUBITAL  Final   Special Requests BOTTLES DRAWN AEROBIC AND ANAEROBIC 3ML  Final   Culture   Final           BLOOD CULTURE  RECEIVED NO GROWTH TO DATE CULTURE WILL BE HELD FOR 5 DAYS BEFORE ISSUING A FINAL NEGATIVE REPORT Performed at [facility]    Report Status PENDING  Incomplete  Blood Culture (routine x 2)     Status: None (Preliminary result)   Collection Time: 12/15/14  7:00 PM  Result Value Ref Range Status   Specimen Description BLOOD LEFT HAND  Final   Special Requests BOTTLES DRAWN AEROBIC ONLY 3ML  Final   Culture   Final           BLOOD CULTURE RECEIVED NO GROWTH TO DATE CULTURE WILL BE HELD FOR 5 DAYS BEFORE ISSUING A FINAL NEGATIVE REPORT Performed at [facility]    Report Status PENDING  Incomplete  Urine culture     Status: None   Collection Time: 12/15/14  9:26 PM  Result Value Ref Range Status   Specimen Description URINE, CATHETERIZED  Final   Special Requests NONE  Final   Colony Count NO GROWTH  Performed at [facility]   Final   Culture NO GROWTH Performed at [facility]   Final   Report Status 12/17/2014 FINAL  Final  MRSA PCR Screening     Status: Abnormal   Collection Time: 12/16/14  3:37 AM  Result Value Ref Range Status   MRSA by PCR POSITIVE (A) NEGATIVE Final    Comment:        The GeneXpert MRSA Assay (FDA approved for NASAL specimens only), is one component of a comprehensive MRSA colonization surveillance program. It is not intended to diagnose MRSA infection nor to guide or monitor treatment for MRSA infections. RESULT CALLED TO, READ BACK BY AND VERIFIED WITH: C. YOUNG RN AT 0615 ON 04.21.16 BY SHUEA   Body fluid culture     Status: None   Collection Time: 12/16/14 12:56 PM  Result Value Ref Range Status   Specimen Description SYNOVIAL LT KNEE  Final   Special Requests NONE  Final   Gram Stain   Final    MODERATE WBC PRESENT,BOTH PMN AND MONONUCLEAR NO ORGANISMS SEEN Performed at [facility]    Culture   Final    FEW STREPTOCOCCUS GROUP C Note: CRITICAL RESULT CALLED TO, READ BACK BY AND VERIFIED WITH:  ADRIAN BULLOCK @ 4:36 PM 12/18/14 BY DWEEKS Performed at [facility]    Report Status 12/19/2014 FINAL  Final  Anaerobic culture     Status: None (Preliminary result)   Collection Time: 12/17/14  8:45 AM  Result Value Ref Range Status   Specimen Description FLUID SYNOVIAL LEFT KNEE  Final   Special Requests POF VANCOMYCIN,FLAGEL AD AZACTAM  Final   Gram Stain   Final    ABUNDANT WBC PRESENT,BOTH PMN AND MONONUCLEAR NO ORGANISMS SEEN Performed at [facility] Performed at [facility]    Culture   Final    NO ANAEROBES ISOLATED; CULTURE IN PROGRESS FOR 5 DAYS Performed at [facility]    Report Status PENDING  Incomplete  Gram stain     Status: None   Collection Time: 12/17/14  8:45 AM  Result Value Ref Range Status   Specimen Description FLUID SYNOVIAL LEFT KNEE  Final   Special Requests POF VANCOMYCIN,FLAGEL AND AZACTAM  Final   Gram Stain   Final    ABUNDANT WBC PRESENT,BOTH PMN AND MONONUCLEAR NO ORGANISMS SEEN    Report Status 12/17/2014 FINAL  Final  AFB culture with smear     Status: None (Preliminary result)   Collection Time: 12/17/14  8:45 AM  Result Value Ref Range Status   Specimen Description FLUID SYNOVIAL LEFT KNEE  Final   Special Requests POF VANCOMYCIN,FLAGEL AND AZACTAM  Final   Acid Fast Smear   Final    NO ACID FAST BACILLI SEEN Performed at [facility]    Culture   Final    CULTURE WILL BE EXAMINED FOR 6 WEEKS BEFORE ISSUING A FINAL REPORT Performed at [facility]    Report Status PENDING  Incomplete  Body fluid culture     Status: None   Collection Time: 12/17/14  8:45 AM  Result Value Ref Range Status   Specimen Description FLUID SYNOVIAL LEFT KNEE  Final   Special Requests PATIENT ON FOLLOWING VANC,FLAGEL,AZACTAM  Final   Gram Stain   Final    ABUNDANT WBC PRESENT,BOTH PMN AND MONONUCLEAR NO ORGANISMS SEEN Performed at [facility] Performed at [facility]    Culture   Final  FEW STREPTOCOCCUS GROUP C Note: CRITICAL RESULT CALLED TO, READ BACK BY AND VERIFIED WITH: ADRIAN BULLOCK @ 4:37 PM 12/18/14 BY DWEEKS Performed at [facility]    Report Status 12/19/2014 FINAL  Final     Studies: Dg Chest Port 1 View  12/20/2014   CLINICAL DATA:  69 year old female status post placement of central line  EXAM: PORTABLE CHEST - 1 VIEW  COMPARISON:  Prior chest x-ray earlier today at 19:10 p.m.  FINDINGS: New right IJ approach central venous catheter. Catheter tip overlies the superior cavoatrial junction. No evidence of pneumothorax or pleural effusion. Right upper extremity PICC in similar position coiled within the right subclavian vein. Stable borderline cardiomegaly. Aortic atherosclerosis. Pulmonary vascular congestion bordering on mild interstitial edema. Bi hilar prominence is noted again. No acute osseous abnormality.  IMPRESSION: 1. The tip of the new right IJ approach central venous catheter projects over the superior cavoatrial junction. No evidence of pneumothorax or other complication. 2. Otherwise, unchanged appearance of the chest.   Electronically Signed   By: Jacqulynn Cadet M.D.   On: 12/20/2014 21:55   Dg Chest Port 1 View  12/20/2014   CLINICAL DATA:  Line placement.  EXAM: PORTABLE CHEST - 1 VIEW  COMPARISON:  12/15/2014  FINDINGS: There has been interval placement of a right-sided PICC line which extends to the junction of the right subclavian vein to SVC were it then loops back 180 degrees extending 7 cm were then loops 180 degrees again extending 3.5 cm with tip within the mid right subclavian vein.  Lungs are adequately inflated related with minimal prominence of the perihilar markings. Cardiomediastinal silhouette and remainder of the exam is unchanged.  IMPRESSION: Possible mild vascular congestion.  Interval placement of right-sided PICC line which is looped within the right subclavian vein with tip over the mid subclavian vein.  These results  were called by telephone at the time of interpretation on 12/20/2014 at 7:37 pm to patient's nurse, Wendall Mola, who verbally acknowledged these results.   Electronically Signed   By: Marin Olp M.D.   On: 12/20/2014 19:39    Scheduled Meds: . Chlorhexidine Gluconate Cloth  6 each Topical Q0600  . colchicine  1.2 mg Oral Daily  . furosemide  40 mg Oral BID  . gabapentin  300 mg Oral TID  . isosorbide mononitrate  10 mg Oral BID  . levofloxacin (LEVAQUIN) IV  750 mg Intravenous Q48H  . metoprolol tartrate  75 mg Oral BID  . mupirocin ointment  1 application Nasal BID  . pantoprazole  40 mg Oral Daily  . polyethylene glycol  17 g Oral Daily  . predniSONE  50 mg Oral Q breakfast  . senna  1 tablet Oral BID  . sodium chloride  10-40 mL Intracatheter Q12H  . sodium chloride  3 mL Intravenous Q12H  . sodium chloride  3 mL Intravenous Q12H   Continuous Infusions: . sodium chloride    . dextrose Stopped (12/21/14 1115)       Time spent: 25 minutes    Anaisabel Pederson, [place]  [facility] Pager [phone]. If 7PM-7AM, please contact night-coverage at [email or web address], password [facility] 12/21/2014, 1:43 PM  LOS: 6 days

## 2014-12-21 NOTE — Progress Notes (Signed)
IV team made aware of PICC line looped. Radiologist had called with results.Forrest Moron NP made aware

## 2014-12-21 NOTE — Op Note (Signed)
NAMELUDA, CHARBONNEAU               ACCOUNT NO.:  [id number]  MEDICAL RECORD NO.:  [id number]  LOCATION:  2H24C                        FACILITY:  [place]  PHYSICIAN:  Astrid Divine. Marcelino Scot, M.D. DATE OF BIRTH:  Jul 25, 1946  DATE OF PROCEDURE:  12/20/2014 DATE OF DISCHARGE:                              OPERATIVE REPORT   PREOPERATIVE DIAGNOSIS:  Septic left knee.  POSTOPERATIVE DIAGNOSIS:  Septic left knee.  PROCEDURE:  Left knee arthroscopic lavage with repeat debridement and partial synovectomy.  SURGEON:  Astrid Divine. Marcelino Scot, MD.  ASSISTANT:  Jari Pigg, PA-C.  ANESTHESIA:  General.  COMPLICATIONS:  None.  DISPOSITION:  To PACU.  CONDITION:  Stable.  BRIEF SUMMARY AND INDICATIONS FOR PROCEDURE:  Kelly [name] is a 69 year old female with multiple medical problems including severe obesity, chronic lymphedema with a posterior thigh decubitus ulcer, and more recently a septic left knee treated with arthroscopic lavage and during which was identified a numerous pathologic conditions including a severe lateral meniscus tear much smaller size and a complete grade 4 and 3 changes over all compartments.  There was extensive synovitis which was debrided at that time as well.  She now returns to the OR for repeat I and D given the severity of her infection and persistent symptoms including severe pain with range of motion of the knee.  BRIEF SUMMARY OF PROCEDURE: The patient was given additional antibiotics and remained on Levaquin for treatment of her infection with vanc having been stopped by Infectious Disease Service.  After induction of general anesthesia, the left lower extremity was prepped and draped in usual sterile fashion and the drain was removed.  The suture was removed from the old incisions and the knee scope reintroduced into the knee after a time out, standard sterile prep and drape.  There was some slight drainage, but it was certainly not purulent.  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  We ran 6000 mL of fluid through the knee joint and then evacuated the fluid, injected it with Marcaine and morphine, and closed the portals with a simple nylon suture and then a sterile gently compressive dressing from foot to thigh.  The patient was awakened from anesthesia and transported to PACU in stable condition.  Because of her large body habitus with a BMI of 54, I did have assistant, Kelly [name], who helped to manipulate the knee.  PROGNOSIS:  The patient will be weightbearing as tolerated in the left lower extremity.  She will continue on antibiotics per the ID Service with management by the Medical Service.  Plan to see her back in followup for removal of sutures, wound check, and clinical evaluation in 14 days.  We will continue to follow her in the hospital for clinical progress with PT as well.     Astrid Divine. Marcelino Scot, M.D.     MHH/MEDQ  D:  12/20/2014  T:  12/21/2014  Job:  [id number]

## 2014-12-21 NOTE — Progress Notes (Signed)
Forrest Moron made aware that IV team stated that central line was through the loop of the PICC line and that IV RN stated if she D/C PICC line then she would pull the central line into the arm. Forrest Moron stated to leave the PICC line in place and that if needed tomorrow that patient could be taken down to IR to have PICC line removed.

## 2014-12-21 NOTE — Clinical Social Work Note (Signed)
Clinical Social Work Assessment  Patient Details  Name: Kelly [name] MRN: [id number] Date of Birth: [birth year]  Date of referral:  12/21/14               Reason for consult:  Facility Placement                Permission sought to share information with:  Facility Sport and exercise psychologist, Family Supports Permission granted to share information::  Yes, Verbal Permission Granted  Name::     Kelly [name]  Relationship::  niece/HCPOA  Contact Information:  [phone]  Housing/Transportation Living arrangements for the past 2 months:  [place] of Information:  Patient, Power of Attorney Patient Interpreter Needed:  None Criminal Activity/Legal Involvement Pertinent to Current Situation/Hospitalization:  No - Comment as needed Significant Relationships:  Other Family Members Lives with:  Self Do you feel safe going back to the place where you live?  No Need for family participation in patient care:  No (Coment)  Care giving concerns:  Pt unable to manage at home alone. Will need ST rehab.  Social Worker assessment / plan:  Pt aware of recommendation for rehab and agreeable. Pt reported to CSW she has been to facilities in the past and mentioned [facility] as a potential request. Pt wanting referral to be sent to all [facility] to assess available options. Pt was made aware that per MD note possibility of dc tomorrow. Pt anticipates no issues with being able to select a facility by tomorrow. Pt asked that CSW speak with her niece/HCPOA who is her primary support and update her on the plan. Pt reported that her niece will be assisting with facility decision. Pt niece Kelly [name] informed CSW pt was extremely pleased with [facility] facility in the past and requesting referral be sent to [facility] as well.   Insurance information:  Medicare PT Recommendations:  [place] / Referral to community resources:  [place]  Patient/Family's Response to care:  Pt in good spirits during visit and agreeable to plan for rehab at dc.  Patient/Family's Understanding of and Emotional Response to Diagnosis, Current Treatment, and Prognosis:  Pt happy and positive through out assessment. Pt seems to have a good understanding of medical condition, but CSW unable to determine at this time if pt has good insight into long term reality of managing at home alone even after rehab.   Emotional Assessment Appearance:  Appears stated age, Well-Groomed Affect (typically observed):  Accepting, Appropriate, Happy, Pleasant Orientation:  Oriented to Self, Oriented to Place, Oriented to  Time, Oriented to Situation Alcohol / Substance use:  Not Applicable Psych involvement (Current and /or in the community):  No (Comment)  Discharge Needs  Concerns to be addressed:  Discharge Planning Concerns, Home Safety Concerns Readmission within the last 30 days:  No Current discharge risk:  Dependent with Mobility Barriers to Discharge:  Continued Medical Work up   [employer], [place]

## 2014-12-21 NOTE — Clinical Social Work Placement (Signed)
   CLINICAL SOCIAL WORK PLACEMENT  NOTE  Date:  12/21/2014  Patient Details  Name: Kelly [name] MRN: [id number] Date of Birth: 1946-02-28  Clinical Social Work is seeking post-discharge placement for this patient at the [place] level of care (*CSW will initial, date and re-position this form in  chart as items are completed):  Yes   Patient/family provided with [place] Work Department's list of facilities offering this level of care within the geographic area requested by the patient (or if unable, by the patient's family).  Yes   Patient/family informed of their freedom to choose among providers that offer the needed level of care, that participate in Medicare, Medicaid or managed care program needed by the patient, have an available bed and are willing to accept the patient.  Yes   Patient/family informed of [facility]'s ownership interest in [facility] and [facility], as well as of the fact that they are under no obligation to receive care at these facilities.  PASRR submitted to EDS on       PASRR number received on       Existing PASRR number confirmed on 12/21/14     FL2 transmitted to all facilities in geographic area requested by pt/family on 12/21/14     FL2 transmitted to all facilities within larger geographic area on       Patient informed that his/her managed care company has contracts with or will negotiate with certain facilities, including the following:            Patient/family informed of bed offers received.  Patient chooses bed at       Physician recommends and patient chooses bed at      Patient to be transferred to   on  .  Patient to be transferred to facility by       Patient family notified on   of transfer.  Name of family member notified:        PHYSICIAN Please prepare priority discharge summary, including medications, Please sign FL2     Additional Comment:     _______________________________________________ Berton Mount, [place]

## 2014-12-21 NOTE — Progress Notes (Signed)
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 Explained to pt need to progress mobility and transfers for progression to independent and after pt willing to mobilize. Pt educated for use of belt to assist with LLE movement, need to increased ROM LLE and progression of activity. Pt pleasant and putting forth good effort with increased time and cueing. Will continue to follow.   Follow Up Recommendations  SNF     Equipment Recommendations  Wheelchair (measurements PT);Wheelchair cushion (measurements PT)    Recommendations for Other Services       Precautions / Restrictions Precautions Precautions: Fall Restrictions Weight Bearing Restrictions: Yes LLE Weight Bearing: Weight bearing as tolerated    Mobility  Bed Mobility Overal bed mobility: Needs Assistance;+2 for physical assistance Bed Mobility: Supine to Sit;Sit to Supine     Supine to sit: +2 for physical assistance;Max assist Sit to supine: +2 for physical assistance;Max assist   General bed mobility comments: Pt with cues for sequence with use of belt for pt to assist moving her LLE initially on her own toward EOB. Pt requested pivot to left today and performed with increased time, pad and 2 person assist with sequential cues and support of LLE once EOB as to not drop down. Pt EOB grossly 5 min. Pivot back to bed with pt controlling trunk with cues and one person per leg to pivot pt back into bed. Min assist in trendelenburg with cues to scoot to  [facility]  Transfers Overall transfer level:  (did not attempt today secondary to pain)                  Ambulation/Gait                 Stairs            Wheelchair Mobility    Modified Rankin (Stroke Patients Only)       Balance Overall balance assessment: Needs assistance Sitting-balance support: Bilateral upper extremity supported Sitting balance-Leahy Scale: Fair                              Cognition Arousal/Alertness: Awake/alert Behavior During Therapy: WFL for tasks assessed/performed Overall Cognitive Status: Within Functional Limits for tasks assessed                      Exercises      General Comments        Pertinent Vitals/Pain Pain Score: 6  Pain Location: left knee particularly with flexion Pain Descriptors / Indicators: Aching Pain Intervention(s): Premedicated before session;Repositioned;Limited activity within patient's tolerance  HR 96 sats 98% on RA    Home Living                      Prior Function            PT Goals (current goals can now be found in the care plan section) Progress towards PT goals: Progressing toward goals  Frequency       PT Plan Current plan remains appropriate    Co-evaluation             End of Session Equipment Utilized During Treatment: Gait belt Activity Tolerance: Patient limited by pain Patient left: in bed;with call bell/phone within reach     Time: 0922-1011 PT Time Calculation (min) (ACUTE ONLY): 49 min  Charges:  $Therapeutic Activity: 38-52 mins                    G Codes:      Melford Aase 2014/12/31, 10:39 AM Elwyn Reach, [place]

## 2014-12-22 LAB — CULTURE, BLOOD (ROUTINE X 2)
Culture: NO GROWTH
Culture: NO GROWTH

## 2014-12-22 LAB — BASIC METABOLIC PANEL
Anion gap: 7 (ref 5–15)
BUN: 42 mg/dL — ABNORMAL HIGH (ref 6–23)
CALCIUM: 8.4 mg/dL (ref 8.4–10.5)
CO2: 25 mmol/L (ref 19–32)
Chloride: 103 mmol/L (ref 96–112)
Creatinine, Ser: 1.47 mg/dL — ABNORMAL HIGH (ref 0.50–1.10)
GFR calc Af Amer: 41 mL/min — ABNORMAL LOW (ref >90)
GFR calc non Af Amer: 36 mL/min — ABNORMAL LOW (ref >90)
GLUCOSE: 239 mg/dL — AB (ref 70–99)
Potassium: 4.6 mmol/L (ref 3.5–5.1)
Sodium: 135 mmol/L (ref 135–145)

## 2014-12-22 LAB — GLUCOSE, CAPILLARY
Glucose-Capillary: 146 mg/dL — ABNORMAL HIGH (ref 70–99)
Glucose-Capillary: 153 mg/dL — ABNORMAL HIGH (ref 70–99)
Glucose-Capillary: 166 mg/dL — ABNORMAL HIGH (ref 70–99)
Glucose-Capillary: 213 mg/dL — ABNORMAL HIGH (ref 70–99)
Glucose-Capillary: 227 mg/dL — ABNORMAL HIGH (ref 70–99)
Glucose-Capillary: 248 mg/dL — ABNORMAL HIGH (ref 70–99)
Glucose-Capillary: 282 mg/dL — ABNORMAL HIGH (ref 70–99)
Glucose-Capillary: 336 mg/dL — ABNORMAL HIGH (ref 70–99)

## 2014-12-22 LAB — ANAEROBIC CULTURE

## 2014-12-22 MED ORDER — LEVOFLOXACIN 750 MG PO TABS
750.0000 mg | ORAL_TABLET | ORAL | Status: DC
Start: 2014-12-23 — End: 2014-12-23
  Administered 2014-12-22: 750 mg via ORAL
  Filled 2014-12-22: qty 1

## 2014-12-22 NOTE — Progress Notes (Signed)
Bed offers provided to patient and she has chosen [facility]. Bed is available. CSW spoke with Dr. Karleen Hampshire- anticipates stability tomorrow afternoon. Notified Santiago Glad at [facility]- patient is on a bariatric bed and has an air mattress at present.  She will facilitate obtaining a bari bed for patient.  Per patient's request, CSW notified patient's niece- [facility] of above. She requested to be notified of other bed offers and wanted to talk to her aunt about the possibility of [facility] in [place]. She will call CSW back early tomorrow re: this discussion. CSW explained that [facility] would have to be willing to provide a bariatric bed for patient.  CSW let message for admissions director at [employer] HR:CBULA.  Lorie Phenix. Pauline Good, [place]

## 2014-12-22 NOTE — Progress Notes (Signed)
ANTIBIOTIC CONSULT NOTE - follow up  Pharmacy Consult for Levaquin Indication: Septic arthritis   Allergies  Allergen Reactions  . Bactrim [Sulfamethoxazole-Trimethoprim] Other (See Comments)    Hyperkalemia (July 2015)  . Penicillins Hives    Has taken cephalexin 06/2014    Patient Measurements: Height: 5\' 6"  (167.6 cm) Weight: (!) 332 lb 14.3 oz (151 kg) IBW/kg (Calculated) : 59.3   Vital Signs: Temp: 98.5 F (36.9 C) (04/27 1500) Temp Source: Oral (04/27 1500) BP: 134/53 mmHg (04/27 1500) Pulse Rate: 70 (04/27 1500) Intake/Output from previous day: 04/26 0701 - 04/27 0700 In: 3614 [P.O.:1260; I.V.:175; IV Piggyback:150] Out: 5675 [Urine:5675] Intake/Output from this shift: Total I/O In: 720 [P.O.:720] Out: 325 [Urine:325]  Labs:  Recent Labs  12/20/14 0403 12/21/14 1430 12/22/14 0555  CREATININE 1.91* 1.50* 1.47*   Estimated Creatinine Clearance: 56.3 mL/min (by C-G formula based on Cr of 1.47). No results for input(s): VANCOTROUGH, VANCOPEAK, VANCORANDOM, GENTTROUGH, GENTPEAK, GENTRANDOM, TOBRATROUGH, TOBRAPEAK, TOBRARND, AMIKACINPEAK, AMIKACINTROU, AMIKACIN in the last 72 hours.   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 Afebrile, wbc down to 9.5. synovial fluid culture grew few group C strep. ID recommended to narrow antibiotics to levaquin and treat for 14 days. Scr down to 1.47.  Est.normalized Crcl ~ 40-45 ml/min.  ID reccommended 7 days IV abx then 7 days PO abx to complete 14 day course.   Vancomycin  4/20 >> 4/24 Flagyl  4/20 >> 4/24 Aztreonam 4/20 >> 4/24 Levaqin >> 4/24 >>  4/21: L knee synovial fluid>> few group C strep 4/20 blood x2>> ngF 4/20 urine: negative  Goal of Therapy:  Eradication of infection  Plan:  -change Levaquin 750 mg Q 48 hrs to PO with next dose due 4/28; to end 5/5 for total of 14 days of therapy -pharmacy to sign off  Thanks Eudelia Bunch, Pharm.D. [phone] 12/22/2014 4:14 PM

## 2014-12-22 NOTE — Progress Notes (Signed)
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 With pt's weight, mattress sliding off edge of bed making attempt at transfers unsafe. Question whether there is a different bed that would better allow for mobility. PT will continue to follow.    Follow Up Recommendations  SNF     Equipment Recommendations  Wheelchair (measurements PT);Wheelchair cushion (measurements PT)    Recommendations for Other Services       Precautions / Restrictions Precautions Precautions: Fall Restrictions Weight Bearing Restrictions: Yes LLE Weight Bearing: Weight bearing as tolerated    Mobility  Bed Mobility Overal bed mobility: Needs Assistance;+2 for physical assistance Bed Mobility: Supine to Sit;Sit to Supine     Supine to sit: +2 for physical assistance;Max assist Sit to supine: +2 for physical assistance;Max assist   General bed mobility comments: pt assisted with rolling to right by grasping right rail, left LE supported throughout movement. Max A +2 for bring pt's hips fwd to EOB as well as to elevate trunk. At edge of bed today, pt allowed left knee to bend for foot to be placed on floor, improvement from yesterday. Max A +2 for return to supine, pt able to lay self back but unable to lift legs into bed without max A. Max A +2 to scoot to [facility]  Transfers Overall transfer level: Needs assistance               General transfer comment: Air mattress sliding on bed, unsafe to attempt sit to stand from this  position and pt does not think she can tolerate wt on left LE with current pain  Ambulation/Gait             General Gait Details: unable currently   Stairs            Wheelchair Mobility    Modified Rankin (Stroke Patients Only)       Balance Overall balance assessment: Needs assistance Sitting-balance support: No upper extremity supported;Feet supported Sitting balance-Leahy Scale: Fair                              Cognition Arousal/Alertness: Awake/alert Behavior During Therapy: WFL for tasks assessed/performed Overall Cognitive Status: Within Functional Limits for tasks assessed                      Exercises Total Joint Exercises Ankle Circles/Pumps: AROM;Both;10 reps;Supine;Seated Quad Sets: AROM;Both;10 reps;Supine Gluteal Sets: AROM;Both;10 reps;Supine Long Arc Quad: AAROM;Left;10 reps;Seated    General Comments General comments (skin integrity, edema, etc.): though pt felt that left LE stiffer today, allowed more movement than yesterday. Pt also used strap to reposition leg for comfort in bed. L Leg elevated in extension after session      Pertinent Vitals/Pain Pain Assessment: Faces Faces Pain Scale: Hurts little more Pain Location: left knee Pain Intervention(s): Premedicated before session;Monitored during session;Limited activity within patient's tolerance    Home Living  Prior Function            PT Goals (current goals can now be found in the care plan section) Acute Rehab PT Goals Patient Stated Goal: knee to get better; be able to manage at home independently PT Goal Formulation: With patient Time For Goal Achievement: 01/02/15 Potential to Achieve Goals: Fair Progress towards PT goals: Progressing toward goals    Frequency  Min 3X/week    PT Plan Current plan remains appropriate    Co-evaluation             End of Session   Activity Tolerance: Patient limited by  pain Patient left: in bed;with call bell/phone within reach     Time: 0913-0946 PT Time Calculation (min) (ACUTE ONLY): 33 min  Charges:  $Therapeutic Activity: 23-37 mins                    G Codes:     Leighton Roach, PT  Acute Rehab Services  [phone]  Leighton Roach 12/22/2014, 11:50 AM

## 2014-12-22 NOTE — Progress Notes (Signed)
Orthopaedic Trauma Service Progress Note  Subjective  Doing ok L knee sore   ROS As above   Objective   BP 155/67 mmHg  Pulse 67  Temp(Src) 98.6 F (37 C) (Oral)  Resp 24  Ht 5\' 6"  (1.676 m)  Wt 151 kg (332 lb 14.3 oz)  BMI 53.76 kg/m2  SpO2 97%  Intake/Output      04/26 0701 - 04/27 0700 04/27 0701 - 04/28 0700   P.O. 1260    I.V. (mL/kg) 175 (1.2)    IV Piggyback 150    Total Intake(mL/kg) 1585 (10.5)    Urine (mL/kg/hr) 5675 (1.6)    Total Output 5675     Net -4090            Exam  Gen: resting comfortably in bed, NAD, eating breakfast Ext:      Left lower Extremity   Dressing c/d/i  Ext warm  + DP pulse  No significant pain with gentle knee motion   Motor and sensory functions intact    Assessment and Plan   POD/HD#: 1  69 y/o female s/p I&D septic L knee  1. Septic L knee s/p I&D            stable exam  Continue with therapies  WBAT  ROM as tolerated  Ice and elevate  Dressing changes as needed   Continue with IV abx per ID recs  2. Hypoglycemia             Per IM   3. DM             Per IM   4. Dispo            continue with therapies    Jari Pigg, PA-C [facility] [phone] (O) 12/22/2014 9:38 AM

## 2014-12-22 NOTE — Clinical Social Work Placement (Signed)
   CLINICAL SOCIAL WORK PLACEMENT  NOTE  Date:  12/22/2014  Patient Details  Name: Kelly [name] MRN: [id number] Date of Birth: 02/25/1946  Clinical Social Work is seeking post-discharge placement for this patient at the [place] level of care (*CSW will initial, date and re-position this form in  chart as items are completed):  Yes   Patient/family provided with [place] Work Department's list of facilities offering this level of care within the geographic area requested by the patient (or if unable, by the patient's family).  Yes   Patient/family informed of their freedom to choose among providers that offer the needed level of care, that participate in Medicare, Medicaid or managed care program needed by the patient, have an available bed and are willing to accept the patient.  Yes   Patient/family informed of [facility] ownership interest in [facility] and [facility], as well as of the fact that they are under no obligation to receive care at these facilities.  PASRR submitted to EDS on       PASRR number received on       Existing PASRR number confirmed on 12/21/14     FL2 transmitted to all facilities in geographic area requested by pt/family on 12/21/14     FL2 transmitted to all facilities within larger geographic area on       Patient informed that his/her managed care company has contracts with or will negotiate with certain facilities, including the following:        Yes   Patient/family informed of bed offers received.  Patient chooses bed at [facility]     Physician recommends and patient chooses bed at      Patient to be transferred to [facility] on  .  Patient to be transferred to facility by [facility])     Patient family notified on   of transfer.  Name of family member notified:        PHYSICIAN Please prepare priority discharge summary, including medications, Please sign FL2      Additional Comment:    _______________________________________________ Williemae Area, LCSW 12/22/2014, 5:05 PM   12/22/14  Per MD- dc anticipated for tomorrow afternoon to [facility].

## 2014-12-22 NOTE — Progress Notes (Signed)
[facility] PROGRESS NOTE  Kelly [name] OZY:[id number] DOB: 07-Jul-1946 DOA: 12/15/2014 PCP: Alvester Chou, NP  Brief narrative 69 year old morbidly obese female with history of diastolic CHF, lymphedema and recurrent cellulitis who was admitted to [facility] on 4/24 for sepsis secondary to septic left knee. Patient transferred to [place] and underwent irrigation and debridement of left knee showing septic left knee joint , medial and lateral meniscus tear, extensive synovitis, and tricompartmental chondromalacia. On 4/25 patient was transferred to stepdown for persistent hypoglycemia of 48 hour duration and over the last 24 hours her CBGs have been well controlled and well over 200. She was transferred back to telemetry today.    Assessment/Plan:  persistent hypoglycemia in type 2 DM resolved. Concerned if she received sulfonylurea accidentally during hospitalization. B- hydroxy butarate level low , recent TSH wnl. Am cortisol wnl. Ordered C-peptide, insulin ab, pro insulin/ insulin ratio, sulfonylurea panel . MRI abdomen to r/o insulinoma but given her size and IVC finter could not be obtained. Even no other associated signs or symptoms, insulinoma is unlikely.she was placed on octeotride 50 mg sq q 6hr prn for hypoglycemia along with 50 mg order prednisone daily with good clinical response.  Sepsis due to septic arthritis of left knee -Patient underwent arthroscopic lavage of infection and extensive synovectomy.  -cx growing group C streptococcus.. D/w ID Dr Linus Salmons, recommended to narrow to levaquin for 2 weeks abx.  -Repeat lavage and debridement with partial synovectomy done on 4/25. Wound VAC removed. -WBAT on left left per ortho. PT eval recommends skilled nursing facility. Social work consulted. - Pain control with when necessary Dilaudid and Vicodin. -disontinue Foley prior to discharge.    Chronic Kidney disease stage III Renal function at baseline  Chronic  diastolic CHF Appears euvolemic. Continue Imdur and metoprolol.    Right posterior thigh pressure ulcer Seen by wound care. Recommend Silver hydrofiber for recalcitrant wound, cover with foam. Change silver hydrofiber daily.Pressure redistribution mattress.  Uncontrolled hypertension Resume metoprolol and Lasix. Will place on when necessary hydralazine.  Morbid obesity  Chronic venous insufficiency  Poor IV access PICC line placed on 4/25 with coiling. Right IJ was then placed by PC CM followed by removal of the PICC line.  DVT prophylaxis: Subcutaneous heparin  Diet: Heart healthy  Code Status: full code Family Communication:  Niece ( teddy)  at bedside Disposition Plan: SNF possibly tomorrow  After orthopedics eval.    Consultants: Orthopedics ( Dr Marcelino Scot)  Procedures:  Irrigation and debridement of left knee on 4/22 with  1. Medial menisectomy 2. Arthroscopic lavage for infection and extensive synovectomy 3. Partial lateral menisectomy 4. Chrondroplasty all three compartments and loose body removal (Left)  Lavage and debridement with partial synovectomy left knee.  Antibiotics:  IV vanco , Flagyl and aztreonam 4/21--4/24  levaquin 4/24--until 5/8  HPI/Subjective: Patient seen and examined . Hypoglycemia resolved.  She is asymptomatic.    Objective: Filed Vitals:   12/22/14 1500  BP: 134/53  Pulse: 70  Temp: 98.5 F (36.9 C)  Resp: 20    Intake/Output Summary (Last 24 hours) at 12/22/14 1753 Last data filed at 12/22/14 1300  Gross per 24 hour  Intake   1080 ml  Output   2300 ml  Net  -1220 ml   Filed Weights   12/15/14 1918 12/17/14 0300 12/17/14 1205  Weight: 139.708 kg (308 lb) 151.048 kg (333 lb) 151 kg (332 lb 14.3 oz)    Exam:   General:  Morbidly obese female ,  no  acute distress  HEENT: moist oral mucosa, supple neck  Chest: Diminished breath sounds due to body habitus  CVS: Normal S1 and S2, no murmurs rub or gallop   GI:  Soft, nondistended, nontender, bowel sounds present   Musculoskeletal: Ace wrap over left leg with wound VAC in place,   CNS: Alert and oriented   Data Reviewed: Basic Metabolic Panel:  Recent Labs Lab 12/16/14 0558 12/18/14 0531 12/20/14 0403 12/21/14 1430 12/22/14 0555  NA 140 140 130* 133* 135  K 4.6 4.1 4.0 5.4* 4.6  CL 105 106 98 100 103  CO2 25 23 21 23 25   GLUCOSE 225* 57* 46* 448* 239*  BUN 37* 42* 52* 40* 42*  CREATININE 1.90* 1.91* 1.91* 1.50* 1.47*  CALCIUM 8.3* 7.9* 7.4* 8.2* 8.4  MG 1.7  --   --   --   --   PHOS 4.2  --   --   --   --    Liver Function Tests:  Recent Labs Lab 12/15/14 1910 12/16/14 0558  AST 18 18  ALT 12 11  ALKPHOS 97 90  BILITOT 0.4 0.6  PROT 8.7* 7.7  ALBUMIN 2.9* 2.4*   No results for input(s): LIPASE, AMYLASE in the last 168 hours. No results for input(s): AMMONIA in the last 168 hours. CBC:  Recent Labs Lab 12/15/14 1910 12/16/14 0558 12/18/14 0531  WBC 15.2* 13.0* 9.5  NEUTROABS 13.3*  --   --   HGB 12.1 11.1* 10.2*  HCT 38.1 35.3* 32.3*  MCV 88.8 88.0 89.5  PLT 231 207 200   Cardiac Enzymes: No results for input(s): CKTOTAL, CKMB, CKMBINDEX, TROPONINI in the last 168 hours. BNP (last 3 results)  Recent Labs  09/03/14 0407 11/23/14 1306  BNP 507.4* 194.0*    ProBNP (last 3 results)  Recent Labs  07/11/14 1959  PROBNP 2482.0*    CBG:  Recent Labs Lab 12/22/14 0451 12/22/14 0532 12/22/14 0834 12/22/14 1212 12/22/14 1736  GLUCAP 248* 227* 213* 166* 146*    Recent Results (from the past 240 hour(s))  Blood Culture (routine x 2)     Status: None   Collection Time: 12/15/14  7:00 PM  Result Value Ref Range Status   Specimen Description BLOOD LEFT ANTECUBITAL  Final   Special Requests BOTTLES DRAWN AEROBIC AND ANAEROBIC 3ML  Final   Culture   Final    NO GROWTH 5 DAYS Performed at [facility]    Report Status 12/22/2014 FINAL  Final  Blood Culture (routine x 2)     Status: None    Collection Time: 12/15/14  7:00 PM  Result Value Ref Range Status   Specimen Description BLOOD LEFT HAND  Final   Special Requests BOTTLES DRAWN AEROBIC ONLY 3ML  Final   Culture   Final    NO GROWTH 5 DAYS Performed at [facility]    Report Status 12/22/2014 FINAL  Final  Urine culture     Status: None   Collection Time: 12/15/14  9:26 PM  Result Value Ref Range Status   Specimen Description URINE, CATHETERIZED  Final   Special Requests NONE  Final   Colony Count NO GROWTH Performed at [facility]   Final   Culture NO GROWTH Performed at [facility]   Final   Report Status 12/17/2014 FINAL  Final  MRSA PCR Screening     Status: Abnormal   Collection Time: 12/16/14  3:37 AM  Result Value Ref Range  Status   MRSA by PCR POSITIVE (A) NEGATIVE Final    Comment:        The GeneXpert MRSA Assay (FDA approved for NASAL specimens only), is one component of a comprehensive MRSA colonization surveillance program. It is not intended to diagnose MRSA infection nor to guide or monitor treatment for MRSA infections. RESULT CALLED TO, READ BACK BY AND VERIFIED WITH: C. YOUNG RN AT 0615 ON 04.21.16 BY SHUEA   Body fluid culture     Status: None   Collection Time: 12/16/14 12:56 PM  Result Value Ref Range Status   Specimen Description SYNOVIAL LT KNEE  Final   Special Requests NONE  Final   Gram Stain   Final    MODERATE WBC PRESENT,BOTH PMN AND MONONUCLEAR NO ORGANISMS SEEN Performed at [facility]    Culture   Final    FEW STREPTOCOCCUS GROUP C Note: CRITICAL RESULT CALLED TO, READ BACK BY AND VERIFIED WITH: ADRIAN BULLOCK @ 4:36 PM 12/18/14 BY DWEEKS Performed at [facility]    Report Status 12/19/2014 FINAL  Final  Anaerobic culture     Status: None   Collection Time: 12/17/14  8:45 AM  Result Value Ref Range Status   Specimen Description FLUID SYNOVIAL LEFT KNEE  Final   Special Requests POF VANCOMYCIN,FLAGEL AD AZACTAM   Final   Gram Stain   Final    ABUNDANT WBC PRESENT,BOTH PMN AND MONONUCLEAR NO ORGANISMS SEEN Performed at [facility] Performed at [facility]    Culture   Final    NO ANAEROBES ISOLATED Performed at [facility]    Report Status 12/22/2014 FINAL  Final  Gram stain     Status: None   Collection Time: 12/17/14  8:45 AM  Result Value Ref Range Status   Specimen Description FLUID SYNOVIAL LEFT KNEE  Final   Special Requests POF VANCOMYCIN,FLAGEL AND AZACTAM  Final   Gram Stain   Final    ABUNDANT WBC PRESENT,BOTH PMN AND MONONUCLEAR NO ORGANISMS SEEN    Report Status 12/17/2014 FINAL  Final  AFB culture with smear     Status: None (Preliminary result)   Collection Time: 12/17/14  8:45 AM  Result Value Ref Range Status   Specimen Description FLUID SYNOVIAL LEFT KNEE  Final   Special Requests POF VANCOMYCIN,FLAGEL AND AZACTAM  Final   Acid Fast Smear   Final    NO ACID FAST BACILLI SEEN Performed at [facility]    Culture   Final    CULTURE WILL BE EXAMINED FOR 6 WEEKS BEFORE ISSUING A FINAL REPORT Performed at [facility]    Report Status PENDING  Incomplete  Body fluid culture     Status: None   Collection Time: 12/17/14  8:45 AM  Result Value Ref Range Status   Specimen Description FLUID SYNOVIAL LEFT KNEE  Final   Special Requests PATIENT ON FOLLOWING VANC,FLAGEL,AZACTAM  Final   Gram Stain   Final    ABUNDANT WBC PRESENT,BOTH PMN AND MONONUCLEAR NO ORGANISMS SEEN Performed at [facility] Performed at [facility]    Culture   Final    FEW STREPTOCOCCUS GROUP C Note: CRITICAL RESULT CALLED TO, READ BACK BY AND VERIFIED WITH: ADRIAN BULLOCK @ 4:37 PM 12/18/14 BY DWEEKS Performed at [facility]    Report Status 12/19/2014 FINAL  Final     Studies: Dg Chest Port 1 View  12/20/2014   CLINICAL DATA:  69 year old female  status post placement of central line  EXAM: PORTABLE CHEST - 1 VIEW  COMPARISON:   Prior chest x-ray earlier today at 19:10 p.m.  FINDINGS: New right IJ approach central venous catheter. Catheter tip overlies the superior cavoatrial junction. No evidence of pneumothorax or pleural effusion. Right upper extremity PICC in similar position coiled within the right subclavian vein. Stable borderline cardiomegaly. Aortic atherosclerosis. Pulmonary vascular congestion bordering on mild interstitial edema. Bi hilar prominence is noted again. No acute osseous abnormality.  IMPRESSION: 1. The tip of the new right IJ approach central venous catheter projects over the superior cavoatrial junction. No evidence of pneumothorax or other complication. 2. Otherwise, unchanged appearance of the chest.   Electronically Signed   By: Jacqulynn Cadet M.D.   On: 12/20/2014 21:55   Dg Chest Port 1 View  12/20/2014   CLINICAL DATA:  Line placement.  EXAM: PORTABLE CHEST - 1 VIEW  COMPARISON:  12/15/2014  FINDINGS: There has been interval placement of a right-sided PICC line which extends to the junction of the right subclavian vein to SVC were it then loops back 180 degrees extending 7 cm were then loops 180 degrees again extending 3.5 cm with tip within the mid right subclavian vein.  Lungs are adequately inflated related with minimal prominence of the perihilar markings. Cardiomediastinal silhouette and remainder of the exam is unchanged.  IMPRESSION: Possible mild vascular congestion.  Interval placement of right-sided PICC line which is looped within the right subclavian vein with tip over the mid subclavian vein.  These results were called by telephone at the time of interpretation on 12/20/2014 at 7:37 pm to patient's nurse, Wendall Mola, who verbally acknowledged these results.   Electronically Signed   By: Marin Olp M.D.   On: 12/20/2014 19:39    Scheduled Meds: . Chlorhexidine Gluconate Cloth  6 each Topical Q0600  . colchicine  1.2 mg Oral Daily  . furosemide  40 mg Oral BID  . gabapentin  300 mg  Oral TID  . insulin aspart  0-5 Units Subcutaneous QHS  . isosorbide mononitrate  10 mg Oral BID  . [START ON 12/23/2014] levofloxacin  750 mg Oral Q48H  . metoprolol tartrate  75 mg Oral BID  . mupirocin ointment  1 application Nasal BID  . pantoprazole  40 mg Oral Daily  . polyethylene glycol  17 g Oral Daily  . senna  1 tablet Oral BID  . sodium chloride  10-40 mL Intracatheter Q12H  . sodium chloride  3 mL Intravenous Q12H  . sodium chloride  3 mL Intravenous Q12H   Continuous Infusions: . sodium chloride         Time spent: 25 minutes    [facility] Pager [phone]. If 7PM-7AM, please contact night-coverage at [email or web address], password [facility] 12/22/2014, 5:53 PM  LOS: 7 days

## 2014-12-23 LAB — GLUCOSE, CAPILLARY
GLUCOSE-CAPILLARY: 175 mg/dL — AB (ref 70–99)
Glucose-Capillary: 155 mg/dL — ABNORMAL HIGH (ref 70–99)

## 2014-12-23 MED ORDER — ZOLPIDEM TARTRATE 5 MG PO TABS: 5.0000 mg | ORAL_TABLET | Freq: Every evening | ORAL | Status: AC | PRN

## 2014-12-23 MED ORDER — OXYCODONE HCL 10 MG PO TABS: 10.0000 mg | ORAL_TABLET | ORAL | Status: AC | PRN

## 2014-12-23 MED ORDER — LEVOFLOXACIN 500 MG PO TABS
500.0000 mg | ORAL_TABLET | Freq: Every day | ORAL | Status: DC
  Administered 2014-12-23: 500 mg via ORAL
  Filled 2014-12-23: qty 1

## 2014-12-23 MED ORDER — LEVOFLOXACIN 500 MG PO TABS: 500.0000 mg | ORAL_TABLET | Freq: Every day | ORAL | Status: AC

## 2014-12-23 NOTE — Clinical Social Work Placement (Signed)
   CLINICAL SOCIAL WORK PLACEMENT  NOTE  Date:  12/23/2014  Patient Details  Name: Kelly [name] MRN: [id number] Date of Birth: 02/25/1946  Clinical Social Work is seeking post-discharge placement for this patient at the [place] level of care (*CSW will initial, date and re-position this form in  chart as items are completed):  Yes   Patient/family provided with [facility] Work Department's list of facilities offering this level of care within the geographic area requested by the patient (or if unable, by the patient's family).  Yes   Patient/family informed of their freedom to choose among providers that offer the needed level of care, that participate in Medicare, Medicaid or managed care program needed by the patient, have an available bed and are willing to accept the patient.  Yes   Patient/family informed of [facility]'s ownership interest in [facility] and [facility], as well as of the fact that they are under no obligation to receive care at these facilities.  PASRR submitted to EDS on       PASRR number received on       Existing PASRR number confirmed on 12/21/14     FL2 transmitted to all facilities in geographic area requested by pt/family on 12/21/14     FL2 transmitted to all facilities within larger geographic area on       Patient informed that his/her managed care company has contracts with or will negotiate with certain facilities, including the following:        Yes   Patient/family informed of bed offers received.  Patient chooses bed at [facility]     Physician recommends and patient chooses bed at      Patient to be transferred to [facility] on 12/23/14.  Patient to be transferred to facility by Ambulance [facility])     Patient family notified on 12/23/14 of transfer.  Name of family member notified:  [name] (niece)     PHYSICIAN Please prepare priority discharge summary, including medications, Please  sign FL2     Additional Comment:    _______________________________________________ Berton Mount, [place]

## 2014-12-23 NOTE — Progress Notes (Signed)
[facility] Progress Note  Subjective  Doing well L knee pain improving   Review of Systems  Constitutional: Negative for fever and chills.  Respiratory: Negative for shortness of breath and wheezing.   Cardiovascular: Negative for chest pain and palpitations.  Gastrointestinal: Negative for nausea and vomiting.     Objective   BP 158/68 mmHg  Pulse 70  Temp(Src) 98.2 F (36.8 C) (Oral)  Resp 12  Ht 5\' 6"  (1.676 m)  Wt 151 kg (332 lb 14.3 oz)  BMI 53.76 kg/m2  SpO2 98%  Intake/Output      04/27 0701 - 04/28 0700 04/28 0701 - 04/29 0700   P.O. 720    I.V. (mL/kg)     IV Piggyback     Total Intake(mL/kg) 720 (4.8)    Urine (mL/kg/hr) 2200 (0.6)    Total Output 2200     Net -1480            Labs CBG (last 3)   Recent Labs  12/22/14 1736 12/22/14 2123 12/23/14 0737  GLUCAP 146* 153* 155*       Exam  Gen: resting comfortably in bed, NAD Ext:       Left lower Extremity               dressing removed  Portals look great  No significant knee effusion  Swelling controlled              Ext warm             + DP pulse             No significant pain with gentle knee motion               Motor and sensory functions intact    Assessment and Plan   POD/HD#: 2  69 y/o female s/p I&D septic L knee  1. Septic L knee s/p I&D            stable exam             Continue with therapies             WBAT             ROM as tolerated             Ice and elevate             Dressing changes as needed               Continue with IV abx per ID recs  2. Hypoglycemia             Per IM   3. DM             Per IM   4. Dispo            continue with therapies   Follow up with ortho in 2 weeks     Jari Pigg, PA-C [facility] [phone] (O) 12/23/2014 9:01 AM

## 2014-12-23 NOTE — Progress Notes (Signed)
CSW (Clinical Education officer, museum) confirmed with facility they can accept pt today. CSW prepared pt dc packet and placed with shadow chart. CSW arranged non-emergent ambulance transport. Pt, pt family, pt nurse, and facility informed. CSW signing off.   [place], [place]

## 2014-12-23 NOTE — Progress Notes (Signed)
ANTIBIOTIC CONSULT NOTE - follow up  Pharmacy Consult for Levaquin Indication: Septic arthritis   Allergies  Allergen Reactions  . Bactrim [Sulfamethoxazole-Trimethoprim] Other (See Comments)    Hyperkalemia (July 2015)  . Penicillins Hives    Has taken cephalexin 06/2014    Patient Measurements: Height: 5\' 6"  (167.6 cm) Weight: (!) 332 lb 14.3 oz (151 kg) IBW/kg (Calculated) : 59.3   Vital Signs: Temp: 98.2 F (36.8 C) (04/28 0605) Temp Source: Oral (04/28 0605) BP: 158/68 mmHg (04/27 2039) Intake/Output from previous day: 04/27 0701 - 04/28 0700 In: 720 [P.O.:720] Out: 2200 [Urine:2200] Intake/Output from this shift:    Labs:  Recent Labs  12/21/14 1430 12/22/14 0555  CREATININE 1.50* 1.47*   Estimated Creatinine Clearance: 56.3 mL/min (by C-G formula based on Cr of 1.47). No results for input(s): VANCOTROUGH, VANCOPEAK, VANCORANDOM, GENTTROUGH, GENTPEAK, GENTRANDOM, TOBRATROUGH, TOBRAPEAK, TOBRARND, AMIKACINPEAK, AMIKACINTROU, AMIKACIN in the last 72 hours.   Assessment: 20 yoF with complicated PMHx on D# 6/01 antibiotics for sepsis 2/2 group C septic arthritis of left knee. Afebrile, wbc down to 9.5. synovial fluid culture grew few group C strep. ID recommended to narrow antibiotics to levaquin and treat for 14 days. Scr down to 1.47.  Est. ~ 56 ml/min.  ID reccommended 7 days IV abx then 7 days PO abx to complete 14 day course.   Vancomycin  4/20 >> 4/24 Flagyl  4/20 >> 4/24 Aztreonam 4/20 >> 4/24 Levaqin >> 4/24 >>  4/21: L knee synovial fluid>> few group C strep 4/20 blood x2>> ngF 4/20 urine: negative  Goal of Therapy:  Eradication of infection  Plan:  -change Levaquin 750 mg Q 48 hrs to 500 mg po q24 hours - to end 5/5 for total of 14 days of therapy -pharmacy to sign off  Thanks Eudelia Bunch, Pharm.D. [phone] 12/23/2014 8:35 AM

## 2014-12-23 NOTE — Discharge Summary (Signed)
Physician Discharge Summary  Kelly [name] FXT:[id number] DOB: [birth year] DOA: 12/15/2014  PCP: Alvester Chou, NP  Admit date: 12/15/2014 Discharge date: 12/23/2014  Time spent: 30 minutes  Recommendations for Outpatient Follow-up:  1. Follow up with PCP in one week 2. Follow up with insulin levels and pro insulin levels as outpatient.  3. Follow up with orthopedics as recommended.  4. Please discontinue the foley in 1to 2 days.   Discharge Diagnoses:  Principal Problem:   Septic arthritis of knee, left Active Problems:   Morbid obesity   Chronic diastolic heart failure   Chronic venous insufficiency   Chronic kidney disease, stage 3   DM type 2 causing CKD stage 3   Coronary atherosclerosis of native coronary artery   Sepsis   Cellulitis   Hypoglycemia   Discharge Condition: improved  Diet recommendation: low sodium carb modified diet  Filed Weights   12/15/14 1918 12/17/14 0300 12/17/14 1205  Weight: 139.708 kg (308 lb) 151.048 kg (333 lb) 151 kg (332 lb 14.3 oz)    History of present illness:  69 year old morbidly obese female with history of diastolic CHF, lymphedema and recurrent cellulitis who was admitted to [facility] on 4/24 for sepsis secondary to septic left knee. Patient transferred to  and underwent irrigation and debridement of left knee showing septic left knee joint , medial and lateral meniscus tear, extensive synovitis, and tricompartmental chondromalacia. On 4/25 patient was transferred to stepdown for persistent hypoglycemia of 48 hour duration and over the last 24 hours her CBGs have been well controlled.   Hospital Course:  persistent hypoglycemia in type 2 DM resolved. Concerned if she received sulfonylurea accidentally during hospitalization. B- hydroxy butarate level low , recent TSH wnl. Am cortisol wnl. Marland Kitchen MRI abdomen to r/o insulinoma but given her size and IVC finter could not be obtained. Even no other associated signs or  symptoms, insulinoma is unlikely.she was placed on octeotride 50 mg sq q 6hr prn for hypoglycemia along with 50 mg order prednisone daily with good clinical response. Her bgs are well controlled.   Sepsis due to septic arthritis of left knee -Patient underwent arthroscopic lavage of infection and extensive synovectomy.  -cx growing group C streptococcus.. D/w ID Dr Linus Salmons, recommended to narrow to levaquin for 2 weeks abx. 8 more doses to finish. -Repeat lavage and debridement with partial synovectomy done on 4/25. Wound VAC removed. -WBAT on left left per ortho. PT eval recommends skilled nursing facility. Social work consulted. - Pain control . Patient wanted foley to be continued and wanted it to be discontinued after 2 to 3 days at the facility. Discussed with her that keeping the foley will predispose her to UTI'S.     Chronic Kidney disease stage III Renal function at baseline  Chronic diastolic CHF Appears euvolemic. Continue Imdur and metoprolol.    Right posterior thigh pressure ulcer Seen by wound care. Recommend Silver hydrofiber for recalcitrant wound, cover with foam. Change silver hydrofiber daily.Pressure redistribution mattress.  Uncontrolled hypertension Resume metoprolol and Lasix. Will place on when necessary hydralazine.  Morbid obesity  Chronic venous insufficiency   Poor IV access PICC line placed on 4/25 with coiling. Right IJ was then placed by PC CM followed by removal of the PICC line.  Procedures: Irrigation and debridement of left knee on 4/22 with  1. Medial menisectomy 2. Arthroscopic lavage for infection and extensive synovectomy 3. Partial lateral menisectomy 4. Chrondroplasty all three compartments and loose body removal (Left)  Lavage  and debridement with partial synovectomy left knee.  Consultations:  Orthopedics Dr Marcelino Scot  Discharge Exam: Danley Danker Vitals:   12/23/14 1100  BP: 155/66  Pulse: 75  Temp: 98.3 F (36.8 C)  Resp: 18     General: alert afebrile comfortable Cardiovascular: s1s2 Respiratory: ctab  Discharge Instructions   Discharge Instructions    Active range of motion    Complete by:  As directed   Range of motion as tolerated L knee     Diet - low sodium heart healthy    Complete by:  As directed      Discharge instructions    Complete by:  As directed   Follow up with orthopedics as recommended.     Weight bearing as tolerated    Complete by:  As directed   Laterality:  left  Extremity:  Lower          Current Discharge Medication List    START taking these medications   Details  levofloxacin (LEVAQUIN) 500 MG tablet Take 1 tablet (500 mg total) by mouth daily. Qty: 8 tablet, Refills: 0      CONTINUE these medications which have NOT CHANGED   Details  amLODipine (NORVASC) 10 MG tablet Take 1 tablet (10 mg total) by mouth daily. Qty: 30 tablet, Refills: 2    cholecalciferol (VITAMIN D) 1000 UNITS tablet Take 1,000 Units by mouth daily.    colchicine 0.6 MG tablet Take 2 tablets by mouth daily.    diclofenac sodium (VOLTAREN) 1 % GEL Apply 2 g topically 4 (four) times daily. Qty: 1 Tube, Refills: 2    esomeprazole (NEXIUM) 40 MG capsule Take 40 mg by mouth daily before breakfast. Take on an empty stomach    furosemide (LASIX) 40 MG tablet Take 40 mg by mouth 2 (two) times daily.     gabapentin (NEURONTIN) 300 MG capsule Take 300 mg by mouth 3 (three) times daily.    isosorbide mononitrate (ISMO,MONOKET) 10 MG tablet Take 10 mg by mouth 2 (two) times daily.    metoprolol tartrate (LOPRESSOR) 25 MG tablet Take 3 tablets (75 mg total) by mouth 2 (two) times daily. Qty: 180 tablet, Refills: 0    Nutritional Supplements (JUVEN NUTRIVIGOR) PACK Take 1 Container by mouth every other day.     Oxycodone HCl 10 MG TABS Take 1 tablet (10 mg total) by mouth every 4 (four) hours as needed (moderate pain). Qty: 180 tablet, Refills: 0    polyethylene glycol (MIRALAX / GLYCOLAX)  packet Take 17 g by mouth daily. Mix with 4-6 oz liquid    saccharomyces boulardii (FLORASTOR) 250 MG capsule Take 1 capsule (250 mg total) by mouth 2 (two) times daily. Qty: 8 capsule, Refills: 0    senna (SENOKOT) 8.6 MG TABS tablet Take 1 tablet (8.6 mg total) by mouth 2 (two) times daily. Qty: 120 each, Refills: 0    zolpidem (AMBIEN) 5 MG tablet Take 1 tablet (5 mg total) by mouth at bedtime as needed for sleep. Qty: 30 tablet, Refills: 5    hydrOXYzine (ATARAX/VISTARIL) 25 MG tablet Take 1 tablet by mouth 3 (three) times daily as needed for itching (itching).       STOP taking these medications     promethazine (PHENERGAN) 25 MG tablet        Allergies  Allergen Reactions  . Bactrim [Sulfamethoxazole-Trimethoprim] Other (See Comments)    Hyperkalemia (July 2015)  . Penicillins Hives    Has taken cephalexin 06/2014   Follow-up Information  Follow up with HANDY,MICHAEL H, MD. Schedule an appointment as soon as possible for a visit in 2 weeks.   Specialty:  Orthopedic Surgery   Why:  For suture removal, For wound re-check   Contact information:   [address] [place] [address] [phone]       Follow up with Alvester Chou, NP. Schedule an appointment as soon as possible for a visit in 1 week.   Specialty:  Nurse Practitioner   Contact information:   [facility] [place] [address] [phone]        The results of significant diagnostics from this hospitalization (including imaging, microbiology, ancillary and laboratory) are listed below for reference.    Significant Diagnostic Studies: Dg Chest 2 View  11/23/2014   CLINICAL DATA:  Chest pain.  Shortness of breath.  EXAM: CHEST  2 VIEW  COMPARISON:  07/11/2014 and 03/03/2014  FINDINGS: There is cardiomegaly with increased pulmonary vascular congestion. No infiltrates or effusions.No acute osseous abnormality.  IMPRESSION: Cardiomegaly. Increased  pulmonary vascular congestion since the prior exam appear   Electronically Signed   By: Lorriane Shire M.D.   On: 11/23/2014 13:41   Nm Pulmonary Perf And Vent  11/23/2014   CLINICAL DATA:  Chest pain and shortness of breath.  EXAM: NUCLEAR MEDICINE VENTILATION - PERFUSION LUNG SCAN  TECHNIQUE: Ventilation images were obtained in multiple projections using inhaled aerosol technetium 99 M DTPA. Perfusion images were obtained in multiple projections after intravenous injection of Tc-21m MAA.  RADIOPHARMACEUTICALS:  39.3 mCi Tc-2m DTPA aerosol and 5.4 mCi Tc-22m MAA  COMPARISON:  Chest x-ray dated 11/22/2012 and CT angiogram of the chest dated 09/21/2011 and nuclear medicine lung scan dated 05/16/2010  FINDINGS: Ventilation: No focal ventilation defect.  Perfusion: No wedge shaped peripheral perfusion defects to suggest acute pulmonary embolism.  IMPRESSION: Normal ventilation-perfusion lung scan. No evidence suggestive of pulmonary embolism.   Electronically Signed   By: Lorriane Shire M.D.   On: 11/23/2014 17:35   Dg Chest Port 1 View  12/20/2014   CLINICAL DATA:  69 year old female status post placement of central line  EXAM: PORTABLE CHEST - 1 VIEW  COMPARISON:  Prior chest x-ray earlier today at 19:10 p.m.  FINDINGS: New right IJ approach central venous catheter. Catheter tip overlies the superior cavoatrial junction. No evidence of pneumothorax or pleural effusion. Right upper extremity PICC in similar position coiled within the right subclavian vein. Stable borderline cardiomegaly. Aortic atherosclerosis. Pulmonary vascular congestion bordering on mild interstitial edema. Bi hilar prominence is noted again. No acute osseous abnormality.  IMPRESSION: 1. The tip of the new right IJ approach central venous catheter projects over the superior cavoatrial junction. No evidence of pneumothorax or other complication. 2. Otherwise, unchanged appearance of the chest.   Electronically Signed   By: Jacqulynn Cadet  M.D.   On: 12/20/2014 21:55   Dg Chest Port 1 View  12/20/2014   CLINICAL DATA:  Line placement.  EXAM: PORTABLE CHEST - 1 VIEW  COMPARISON:  12/15/2014  FINDINGS: There has been interval placement of a right-sided PICC line which extends to the junction of the right subclavian vein to SVC were it then loops back 180 degrees extending 7 cm were then loops 180 degrees again extending 3.5 cm with tip within the mid right subclavian vein.  Lungs are adequately inflated related with minimal prominence of the perihilar markings. Cardiomediastinal silhouette and remainder of the exam is unchanged.  IMPRESSION: Possible mild vascular congestion.  Interval placement of right-sided PICC line which is looped within the right subclavian vein with tip over the mid subclavian vein.  These results were called by telephone at the time of interpretation on 12/20/2014 at 7:37 pm to patient's nurse, Wendall Mola, who verbally acknowledged these results.   Electronically Signed   By: Marin Olp M.D.   On: 12/20/2014 19:39   Dg Chest Portable 1 View  12/15/2014   CLINICAL DATA:  Cellulitis and hypertension  EXAM: PORTABLE CHEST - 1 VIEW  COMPARISON:  11/23/2014  FINDINGS: There is chronic cardiomegaly and chronic fullness of the hila which is likely vascular. There is pulmonary venous congestion and cephalization without overt edema. No convincing pneumonia or pleural effusion.  IMPRESSION: Cardiomegaly and pulmonary venous congestion.   Electronically Signed   By: Monte Fantasia M.D.   On: 12/15/2014 22:25   Dg Knee Complete 4 Views Left  12/16/2014   CLINICAL DATA:  Cellulitis.  Fever.  Pain.  EXAM: LEFT KNEE - COMPLETE 4+ VIEW  COMPARISON:  None.  FINDINGS: There is advanced tricompartmental osteoarthritis with joint space loss and marginal osteophytes. There are intra-articular loose bodies. There is a joint effusion. There is regional arterial calcification. No visible fracture. No sign of osteomyelitis.  IMPRESSION:  Advanced tricompartmental osteoarthritis. Joint effusion with intra-articular loose bodies.   Electronically Signed   By: Nelson Chimes M.D.   On: 12/16/2014 01:17   Dg Tibia/fibula Left Port  12/15/2014   CLINICAL DATA:  Left leg cellulitis, left lower leg pain.  EXAM: PORTABLE LEFT TIBIA AND FIBULA - 2 VIEW  COMPARISON:  05/09/2014 CT  FINDINGS: Osteopenia. Degenerative changes about the knee, incompletely assessed. Atherosclerotic vascular calcifications. No displaced fracture or aggressive osseous lesion. Diffuse soft tissue swelling. Posterior calcaneal enthesophyte.  IMPRESSION: Osteopenia and advanced atherosclerotic disease. Soft tissue swelling. No acute or aggressive osseous finding of the left tibia/fibula.   Electronically Signed   By: Carlos Levering M.D.   On: 12/15/2014 22:27    Microbiology: Recent Results (from the past 240 hour(s))  Blood Culture (routine x 2)     Status: None   Collection Time: 12/15/14  7:00 PM  Result Value Ref Range Status   Specimen Description BLOOD LEFT ANTECUBITAL  Final   Special Requests BOTTLES DRAWN AEROBIC AND ANAEROBIC 3ML  Final   Culture   Final    NO GROWTH 5 DAYS Performed at [facility]    Report Status 12/22/2014 FINAL  Final  Blood Culture (routine x 2)     Status: None   Collection Time: 12/15/14  7:00 PM  Result Value Ref Range Status   Specimen Description BLOOD LEFT HAND  Final   Special Requests BOTTLES DRAWN AEROBIC ONLY 3ML  Final   Culture   Final    NO GROWTH 5 DAYS Performed at [facility]    Report Status 12/22/2014 FINAL  Final  Urine culture     Status: None   Collection Time: 12/15/14  9:26 PM  Result Value Ref Range Status   Specimen Description URINE, CATHETERIZED  Final   Special Requests NONE  Final   Colony Count NO GROWTH Performed at [facility]   Final   Culture NO GROWTH Performed at [facility]   Final   Report Status 12/17/2014 FINAL  Final  MRSA PCR Screening      Status: Abnormal   Collection Time: 12/16/14  3:37 AM  Result Value  Ref Range Status   MRSA by PCR POSITIVE (A) NEGATIVE Final    Comment:        The GeneXpert MRSA Assay (FDA approved for NASAL specimens only), is one component of a comprehensive MRSA colonization surveillance program. It is not intended to diagnose MRSA infection nor to guide or monitor treatment for MRSA infections. RESULT CALLED TO, READ BACK BY AND VERIFIED WITH: C. YOUNG RN AT 0615 ON 04.21.16 BY SHUEA   Body fluid culture     Status: None   Collection Time: 12/16/14 12:56 PM  Result Value Ref Range Status   Specimen Description SYNOVIAL LT KNEE  Final   Special Requests NONE  Final   Gram Stain   Final    MODERATE WBC PRESENT,BOTH PMN AND MONONUCLEAR NO ORGANISMS SEEN Performed at [facility]    Culture   Final    FEW STREPTOCOCCUS GROUP C Note: CRITICAL RESULT CALLED TO, READ BACK BY AND VERIFIED WITH: ADRIAN BULLOCK @ 4:36 PM 12/18/14 BY DWEEKS Performed at [facility]    Report Status 12/19/2014 FINAL  Final  Anaerobic culture     Status: None   Collection Time: 12/17/14  8:45 AM  Result Value Ref Range Status   Specimen Description FLUID SYNOVIAL LEFT KNEE  Final   Special Requests POF VANCOMYCIN,FLAGEL AD AZACTAM  Final   Gram Stain   Final    ABUNDANT WBC PRESENT,BOTH PMN AND MONONUCLEAR NO ORGANISMS SEEN Performed at [facility] Performed at [facility]    Culture   Final    NO ANAEROBES ISOLATED Performed at [facility]    Report Status 12/22/2014 FINAL  Final  Gram stain     Status: None   Collection Time: 12/17/14  8:45 AM  Result Value Ref Range Status   Specimen Description FLUID SYNOVIAL LEFT KNEE  Final   Special Requests POF VANCOMYCIN,FLAGEL AND AZACTAM  Final   Gram Stain   Final    ABUNDANT WBC PRESENT,BOTH PMN AND MONONUCLEAR NO ORGANISMS SEEN    Report Status 12/17/2014 FINAL  Final  AFB culture with smear     Status:  None (Preliminary result)   Collection Time: 12/17/14  8:45 AM  Result Value Ref Range Status   Specimen Description FLUID SYNOVIAL LEFT KNEE  Final   Special Requests POF VANCOMYCIN,FLAGEL AND AZACTAM  Final   Acid Fast Smear   Final    NO ACID FAST BACILLI SEEN Performed at [facility]    Culture   Final    CULTURE WILL BE EXAMINED FOR 6 WEEKS BEFORE ISSUING A FINAL REPORT Performed at [facility]    Report Status PENDING  Incomplete  Body fluid culture     Status: None   Collection Time: 12/17/14  8:45 AM  Result Value Ref Range Status   Specimen Description FLUID SYNOVIAL LEFT KNEE  Final   Special Requests PATIENT ON FOLLOWING VANC,FLAGEL,AZACTAM  Final   Gram Stain   Final    ABUNDANT WBC PRESENT,BOTH PMN AND MONONUCLEAR NO ORGANISMS SEEN Performed at [facility] Performed at [facility]    Culture   Final    FEW STREPTOCOCCUS GROUP C Note: CRITICAL RESULT CALLED TO, READ BACK BY AND VERIFIED WITH: ADRIAN BULLOCK @ 4:37 PM 12/18/14 BY DWEEKS Performed at [facility]    Report Status 12/19/2014 FINAL  Final     Labs: Basic Metabolic Panel:  Recent Labs Lab 12/18/14 0531 12/20/14 0403 12/21/14  1430 12/22/14 0555  NA 140 130* 133* 135  K 4.1 4.0 5.4* 4.6  CL 106 98 100 103  CO2 23 21 23 25   GLUCOSE 57* 46* 448* 239*  BUN 42* 52* 40* 42*  CREATININE 1.91* 1.91* 1.50* 1.47*  CALCIUM 7.9* 7.4* 8.2* 8.4   Liver Function Tests: No results for input(s): AST, ALT, ALKPHOS, BILITOT, PROT, ALBUMIN in the last 168 hours. No results for input(s): LIPASE, AMYLASE in the last 168 hours. No results for input(s): AMMONIA in the last 168 hours. CBC:  Recent Labs Lab 12/18/14 0531  WBC 9.5  HGB 10.2*  HCT 32.3*  MCV 89.5  PLT 200   Cardiac Enzymes: No results for input(s): CKTOTAL, CKMB, CKMBINDEX, TROPONINI in the last 168 hours. BNP: BNP (last 3 results)  Recent Labs  09/03/14 0407 11/23/14 1306  BNP 507.4*  194.0*    ProBNP (last 3 results)  Recent Labs  07/11/14 1959  PROBNP 2482.0*    CBG:  Recent Labs Lab 12/22/14 1212 12/22/14 1736 12/22/14 2123 12/23/14 0737 12/23/14 1106  GLUCAP 166* 146* 153* 155* 175*       Signed:  Ajee Heasley  [facility] 12/23/2014, 12:32 PM

## 2014-12-24 LAB — INSULIN ANTIBODIES, BLOOD: Insulin Antibodies, Human: 6.5 uU/mL — ABNORMAL HIGH

## 2014-12-25 LAB — PROINSULIN/INSULIN RATIO
INSULIN: 16 u[IU]/mL
Proinsulin/Insulin Ratio: 159 %
Proinsulin: 170 pmol/L

## 2014-12-27 ENCOUNTER — Ambulatory Visit: Payer: Medicare Other | Admitting: Internal Medicine

## 2015-01-03 LAB — SULFONYLUREA HYPOGLYCEMICS PANEL, SERUM
Acetohexamide: NEGATIVE ug/mL (ref 20–60)
CHLORPROPAMIDE: NEGATIVE ug/mL (ref 75–250)
GLYBURIDE: NEGATIVE ng/mL
Glimepiride: NEGATIVE ng/mL (ref 80–250)
Glipizide: NEGATIVE ng/mL (ref 200–1000)
Nateglinide: NEGATIVE ng/mL
REPAGLINIDE: NEGATIVE ng/mL
Tolazamide: NEGATIVE ug/mL
Tolbutamide: NEGATIVE ug/mL (ref 40–100)

## 2015-01-29 LAB — AFB CULTURE WITH SMEAR (NOT AT ARMC): ACID FAST SMEAR: NONE SEEN

## 2015-02-07 ENCOUNTER — Ambulatory Visit: Payer: Medicare Other | Admitting: Internal Medicine

## 2015-02-07 DIAGNOSIS — Z0289 Encounter for other administrative examinations: Secondary | ICD-10-CM

## 2015-07-08 ENCOUNTER — Encounter (HOSPITAL_COMMUNITY)
Admission: RE | Admit: 2015-07-08 | Discharge: 2015-07-08 | Disposition: A | Discharge status: Home / Self Care | Payer: No Typology Code available for payment source | Source: Ambulatory Visit | Attending: Nephrology | Admitting: Nephrology

## 2015-07-08 DIAGNOSIS — N183 Chronic kidney disease, stage 3 (moderate): Secondary | ICD-10-CM | POA: Diagnosis not present

## 2015-07-08 DIAGNOSIS — D631 Anemia in chronic kidney disease: Secondary | ICD-10-CM | POA: Insufficient documentation

## 2015-07-08 DIAGNOSIS — Z5181 Encounter for therapeutic drug level monitoring: Secondary | ICD-10-CM | POA: Diagnosis not present

## 2015-07-08 DIAGNOSIS — Z79899 Other long term (current) drug therapy: Secondary | ICD-10-CM | POA: Insufficient documentation

## 2015-07-08 LAB — IRON AND TIBC
Iron: 72 ug/dL (ref 28–170)
Saturation Ratios: 26 % (ref 10.4–31.8)
TIBC: 274 ug/dL (ref 250–450)
UIBC: 202 ug/dL

## 2015-07-08 LAB — FERRITIN: FERRITIN: 410 ng/mL — AB (ref 11–307)

## 2015-07-08 LAB — POCT HEMOGLOBIN-HEMACUE: HEMOGLOBIN: 10.3 g/dL — AB (ref 12.0–15.0)

## 2015-07-08 MED ORDER — EPOETIN ALFA 10000 UNIT/ML IJ SOLN
INTRAMUSCULAR | Status: DC
Start: 2015-07-08 — End: 2015-07-09
  Filled 2015-07-08: qty 1

## 2015-07-08 MED ORDER — EPOETIN ALFA 10000 UNIT/ML IJ SOLN
10000.0000 [IU] | INTRAMUSCULAR | Status: DC
  Administered 2015-07-08: 10000 [IU] via SUBCUTANEOUS

## 2015-07-09 DIAGNOSIS — Z5181 Encounter for therapeutic drug level monitoring: Secondary | ICD-10-CM | POA: Diagnosis not present

## 2015-07-09 DIAGNOSIS — D631 Anemia in chronic kidney disease: Secondary | ICD-10-CM | POA: Diagnosis not present

## 2015-07-09 DIAGNOSIS — N183 Chronic kidney disease, stage 3 (moderate): Secondary | ICD-10-CM | POA: Diagnosis present

## 2015-07-09 DIAGNOSIS — Z79899 Other long term (current) drug therapy: Secondary | ICD-10-CM | POA: Diagnosis not present

## 2015-07-11 ENCOUNTER — Ambulatory Visit: Payer: Medicare Other | Admitting: Internal Medicine

## 2015-07-25 ENCOUNTER — Encounter (HOSPITAL_COMMUNITY): Payer: Medicare Other

## 2015-07-25 ENCOUNTER — Encounter (HOSPITAL_COMMUNITY)
Admission: RE | Admit: 2015-07-25 | Discharge: 2015-07-25 | Disposition: A | Discharge status: Home / Self Care | Payer: No Typology Code available for payment source | Source: Ambulatory Visit | Attending: Nephrology | Admitting: Nephrology

## 2015-07-25 DIAGNOSIS — N183 Chronic kidney disease, stage 3 (moderate): Secondary | ICD-10-CM | POA: Diagnosis not present

## 2015-07-25 LAB — POCT HEMOGLOBIN-HEMACUE: HEMOGLOBIN: 11.1 g/dL — AB (ref 12.0–15.0)

## 2015-07-25 MED ORDER — EPOETIN ALFA 10000 UNIT/ML IJ SOLN
INTRAMUSCULAR | Status: AC
  Administered 2015-07-25: 10000 [IU] via SUBCUTANEOUS
  Filled 2015-07-25: qty 1

## 2015-07-25 MED ORDER — EPOETIN ALFA 10000 UNIT/ML IJ SOLN
10000.0000 [IU] | INTRAMUSCULAR | Status: DC
  Administered 2015-07-25: 10000 [IU] via SUBCUTANEOUS

## 2015-08-03 IMAGING — CR DG TIBIA/FIBULA PORT 2V*L*
1 series · 4 of 4 positions shown · non-contrast
Comparison: 05/09/2014 CT

CLINICAL DATA: Left leg cellulitis, left lower leg pain.

EXAM:
PORTABLE LEFT TIBIA AND FIBULA - 2 VIEW

[Series 1: AP · left · 4 of 4 slices shown]
[im 1/4]
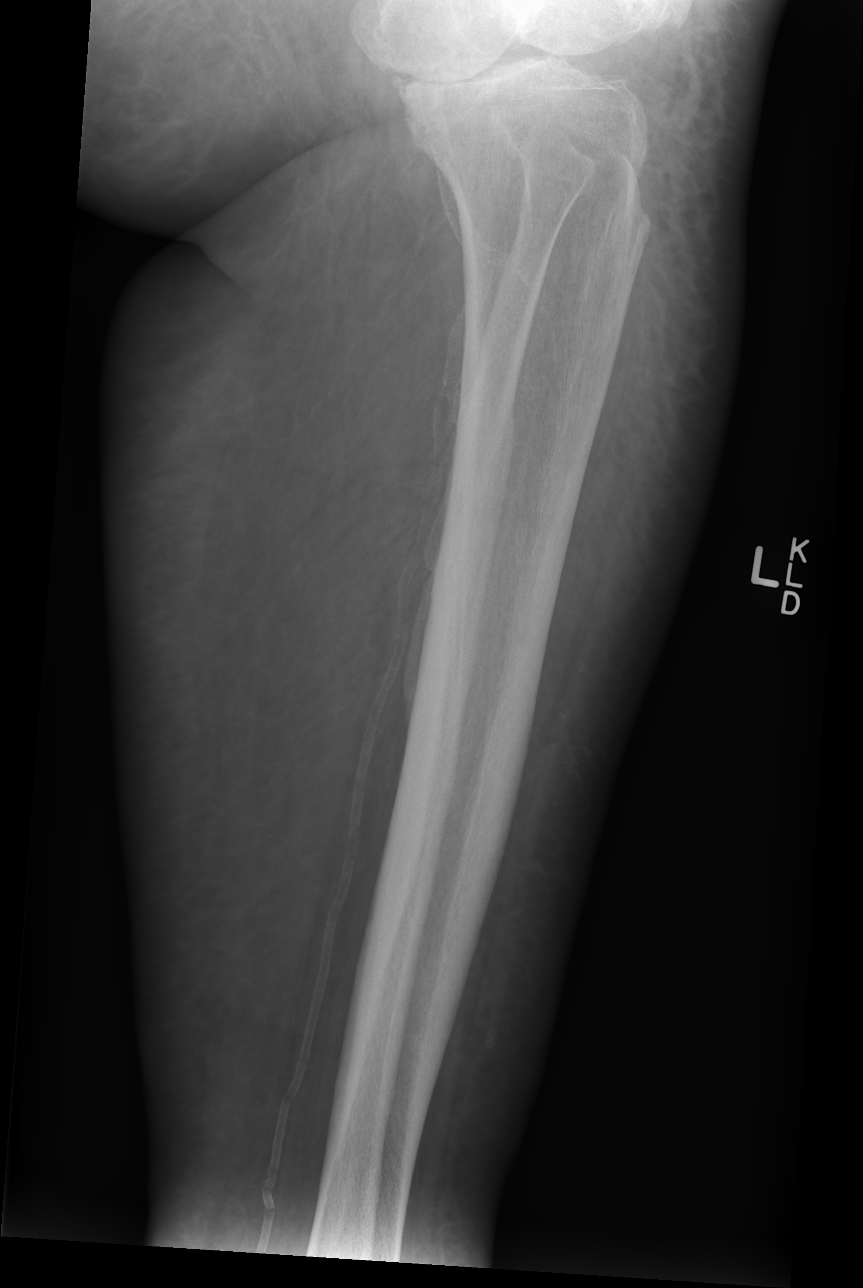
[im 2/4]
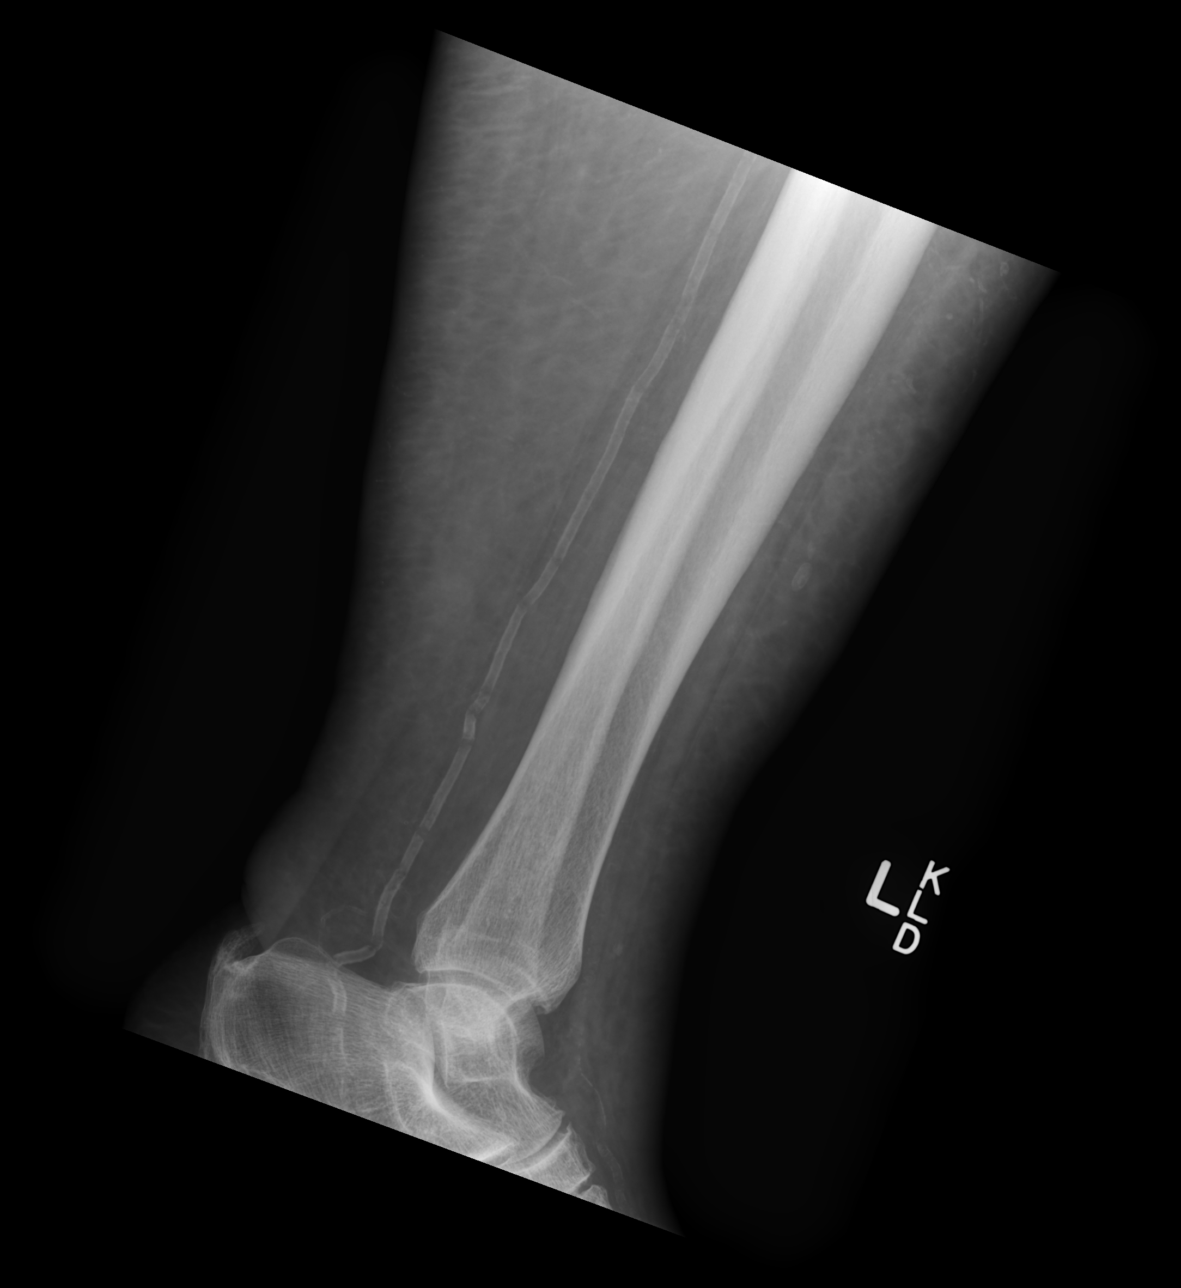
[im 3/4]
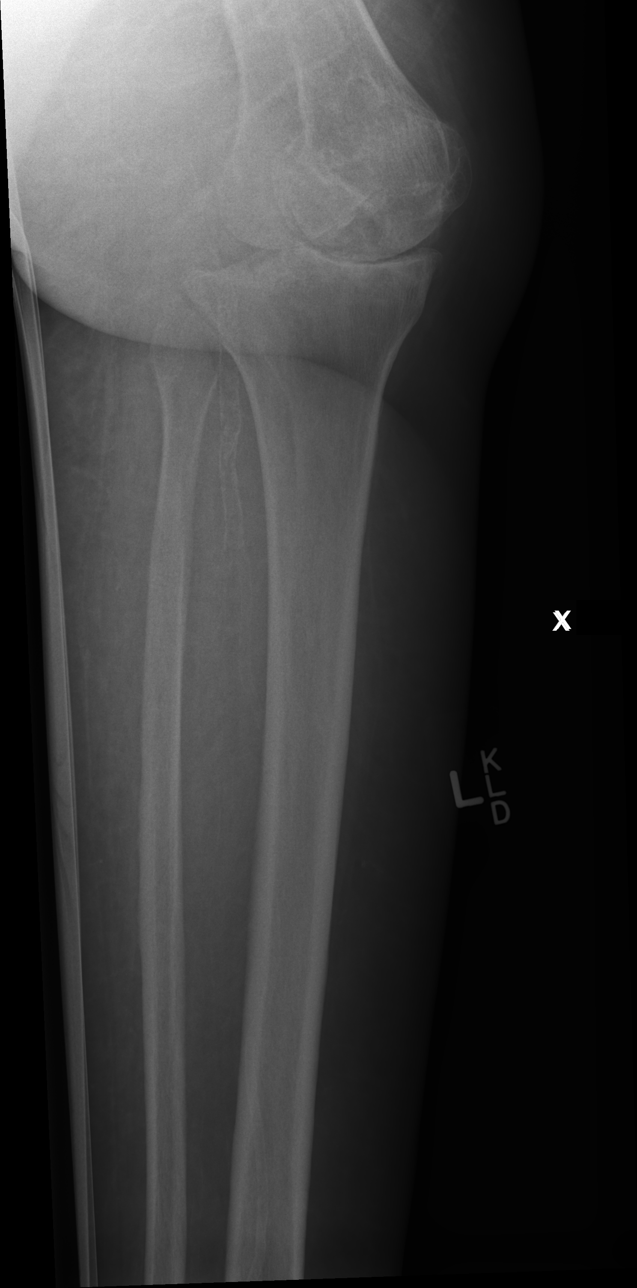
[im 4/4]
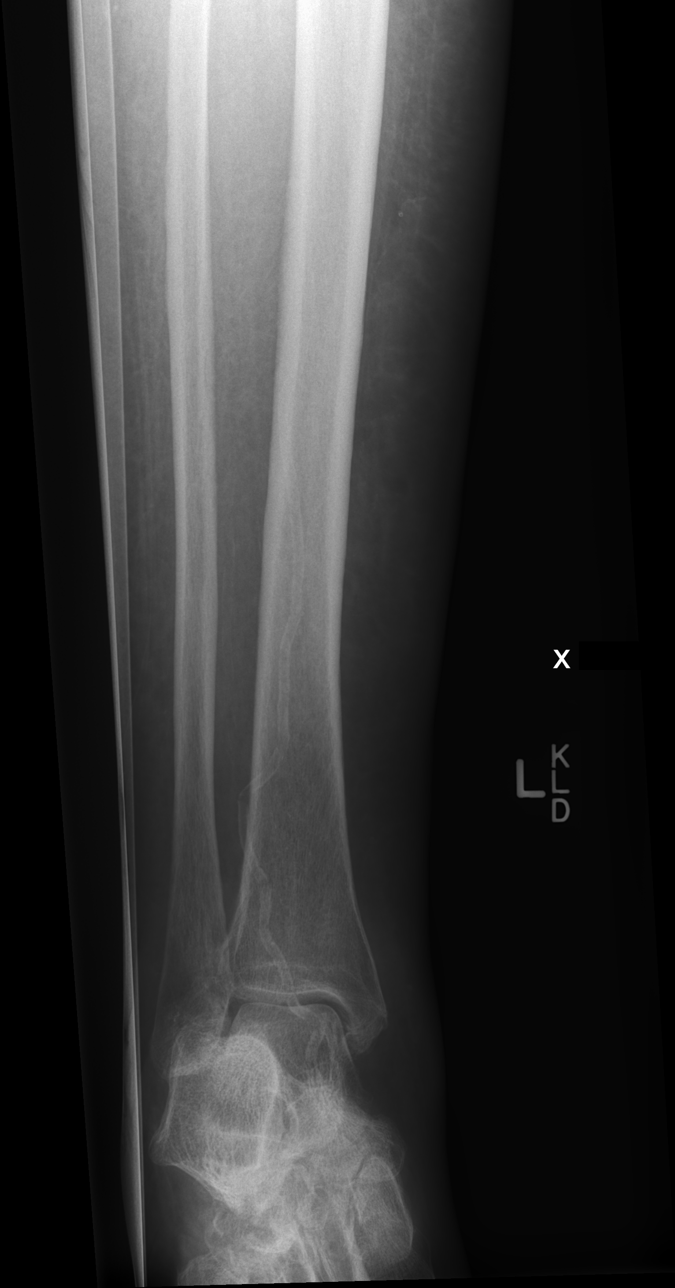

[4 of 4 positions shown; findings below may reference images not displayed]

FINDINGS: Osteopenia. Degenerative changes about the knee, incompletely
assessed. Atherosclerotic vascular calcifications. No displaced
fracture or aggressive osseous lesion. Diffuse soft tissue swelling.
Posterior calcaneal enthesophyte.
IMPRESSION: Osteopenia and advanced atherosclerotic disease. Soft tissue
swelling. No acute or aggressive osseous finding of the left
tibia/fibula.

## 2015-08-04 IMAGING — CR DG KNEE COMPLETE 4+V*L*
4 series · 4 of 4 positions shown · non-contrast
Comparison: None.

CLINICAL DATA: Cellulitis.  Fever.  Pain.

EXAM:
LEFT KNEE - COMPLETE 4+ VIEW

[x knee ap left (1 of 4)]
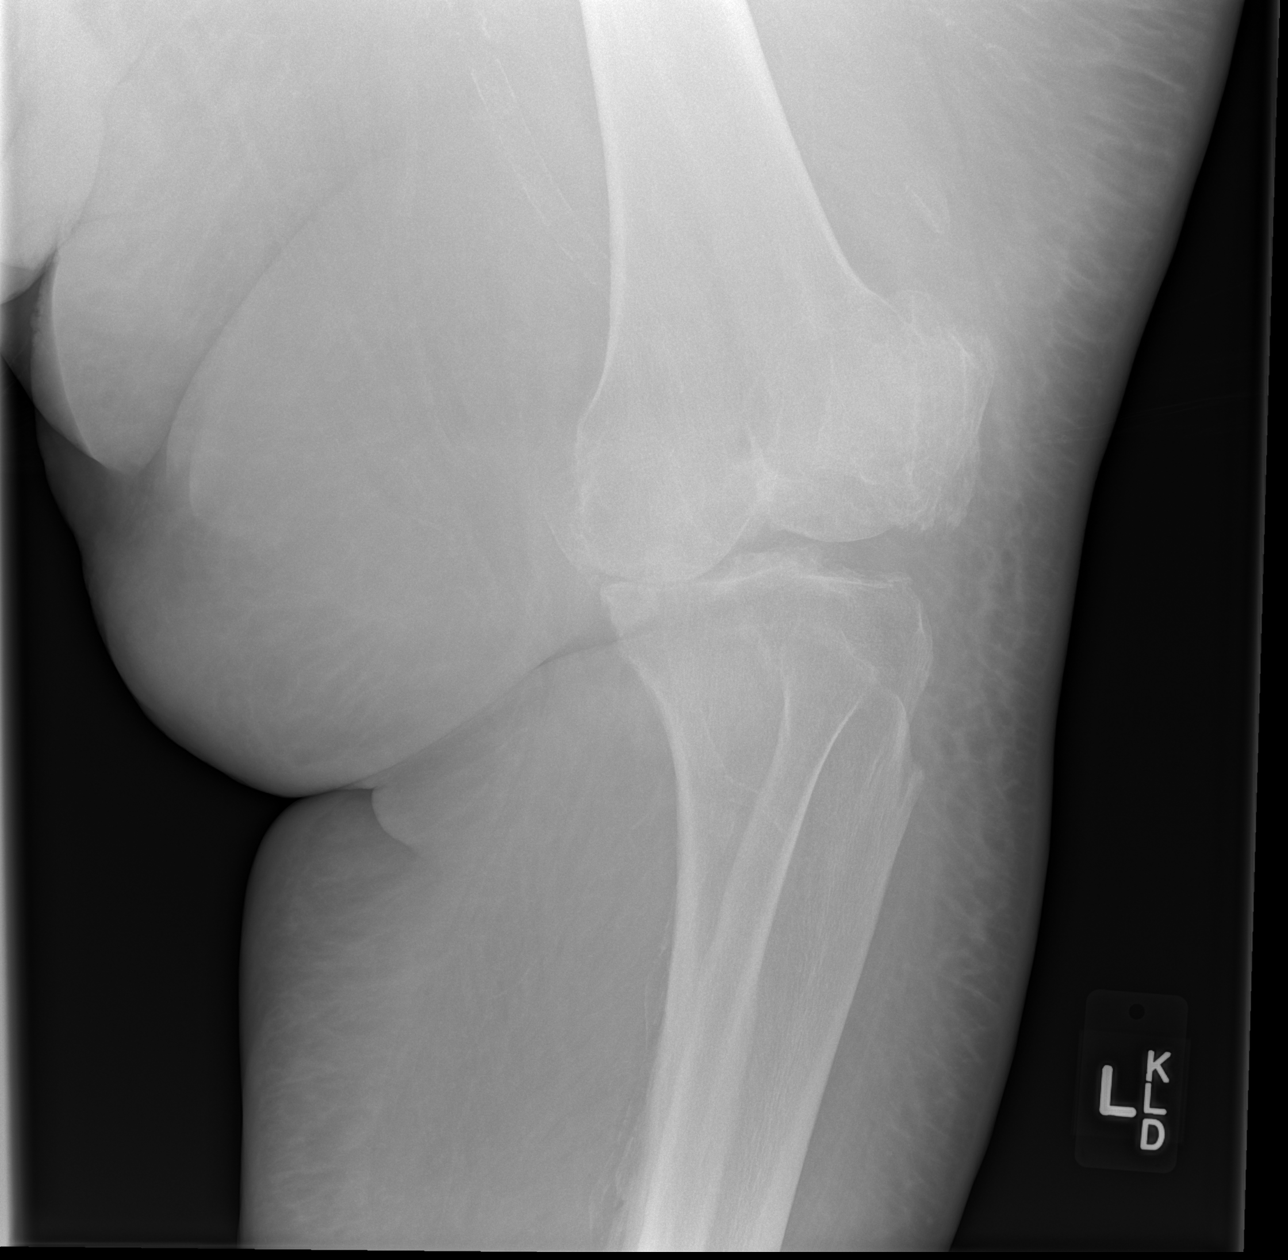

[x knee ap left (2 of 4)]
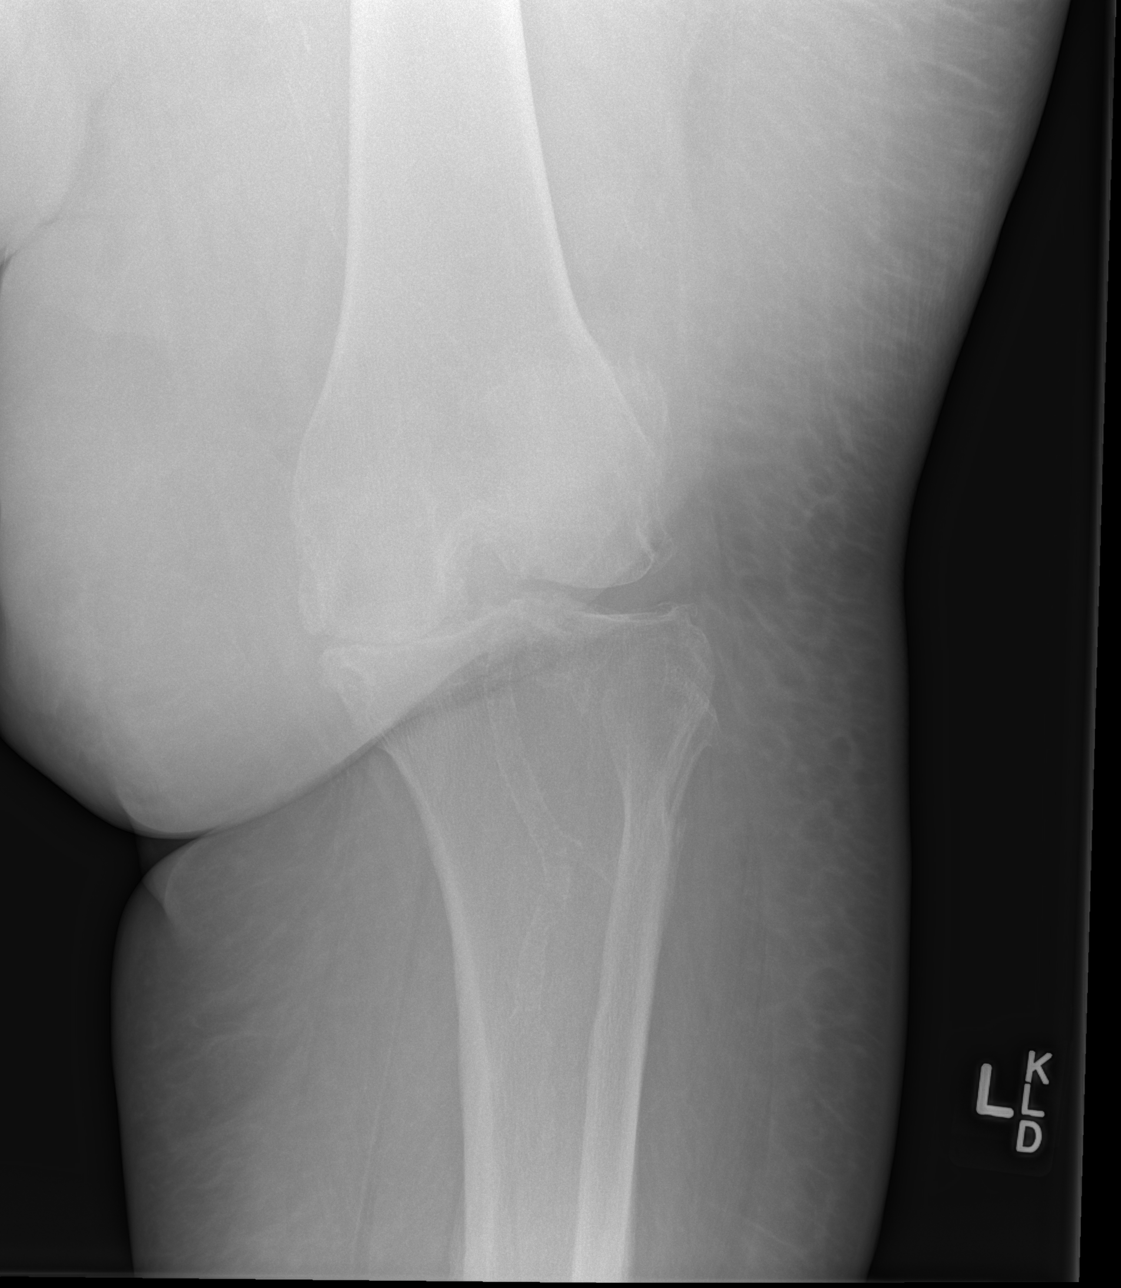

[x knee ap left (3 of 4)]
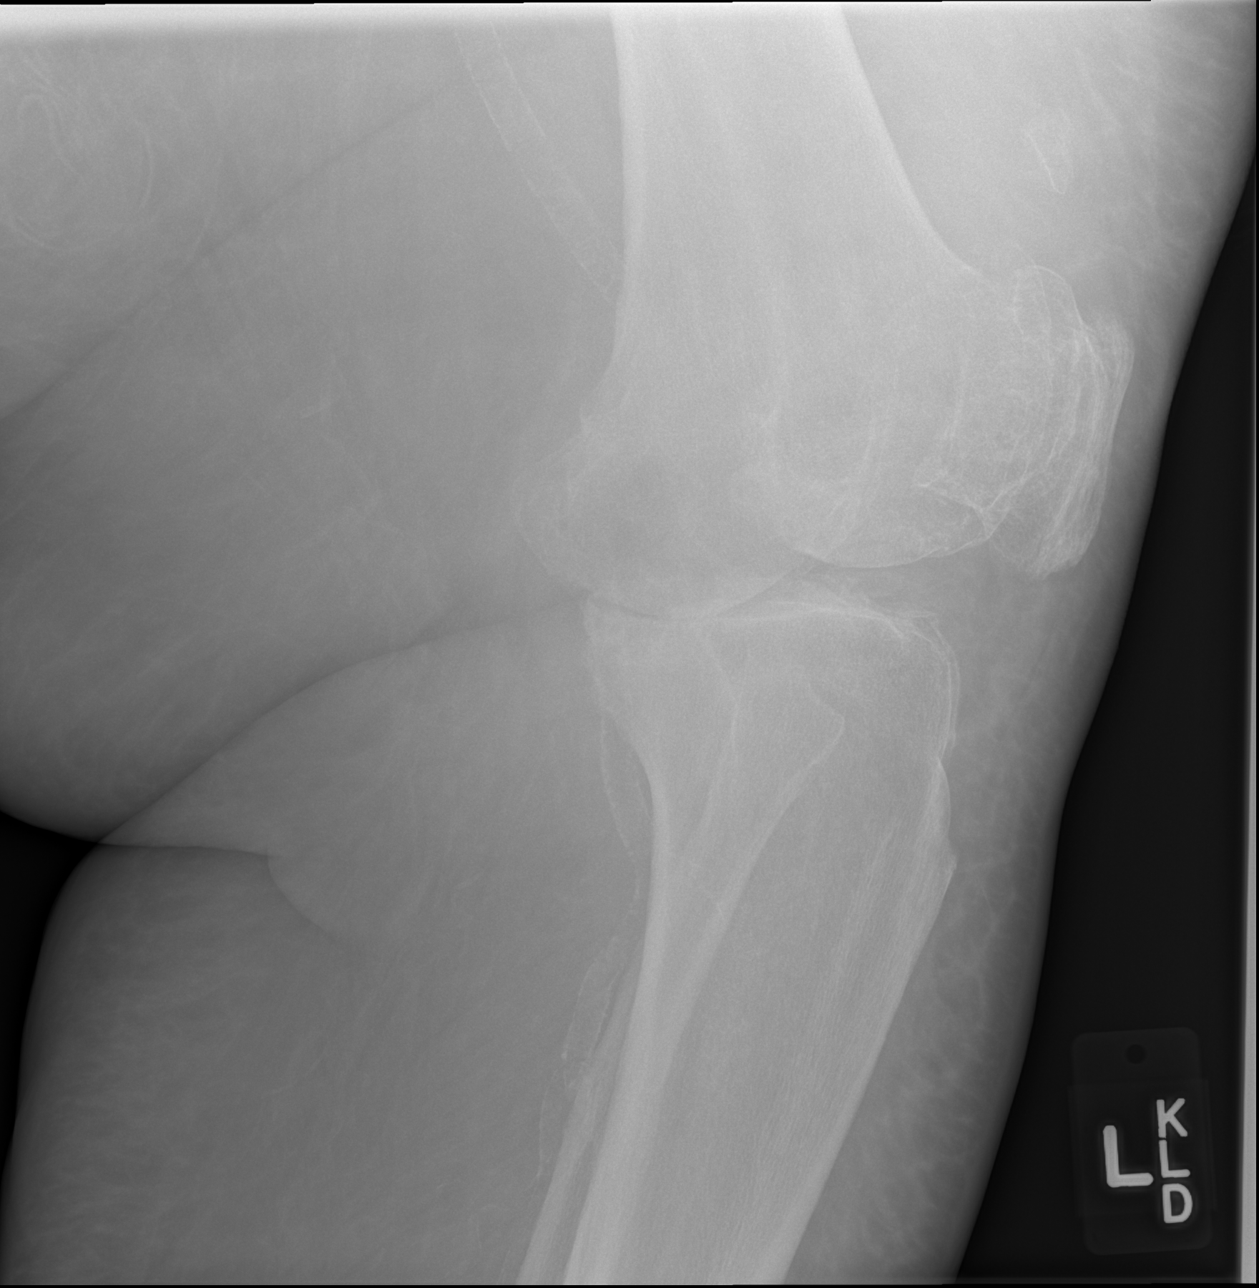

[x knee ap left (4 of 4)]
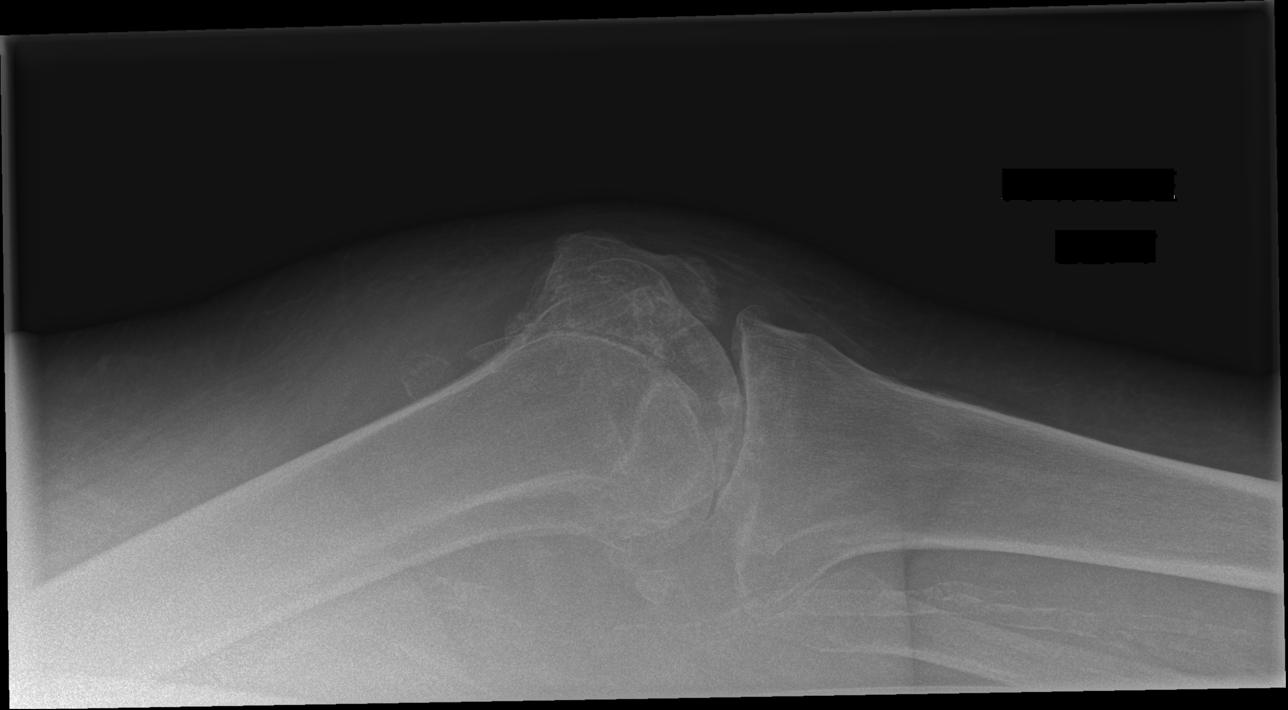

[4 of 4 positions shown; findings below may reference images not displayed]

FINDINGS: There is advanced tricompartmental osteoarthritis with joint space
loss and marginal osteophytes. There are intra-articular loose
bodies. There is a joint effusion. There is regional arterial
calcification. No visible fracture. No sign of osteomyelitis.
IMPRESSION: Advanced tricompartmental osteoarthritis. Joint effusion with
intra-articular loose bodies.

## 2015-08-09 ENCOUNTER — Encounter (HOSPITAL_COMMUNITY): Payer: Medicare Other

## 2015-08-11 ENCOUNTER — Encounter (HOSPITAL_COMMUNITY)
Admission: RE | Admit: 2015-08-11 | Discharge: 2015-08-11 | Disposition: A | Discharge status: Home / Self Care | Payer: Medicare Other | Source: Ambulatory Visit | Attending: Nephrology | Admitting: Nephrology

## 2015-08-11 DIAGNOSIS — D631 Anemia in chronic kidney disease: Secondary | ICD-10-CM | POA: Diagnosis not present

## 2015-08-11 DIAGNOSIS — N183 Chronic kidney disease, stage 3 (moderate): Secondary | ICD-10-CM | POA: Diagnosis not present

## 2015-08-11 DIAGNOSIS — Z79899 Other long term (current) drug therapy: Secondary | ICD-10-CM | POA: Insufficient documentation

## 2015-08-11 DIAGNOSIS — Z5181 Encounter for therapeutic drug level monitoring: Secondary | ICD-10-CM | POA: Diagnosis not present

## 2015-08-11 LAB — IRON AND TIBC
IRON: 60 ug/dL (ref 28–170)
Saturation Ratios: 23 % (ref 10.4–31.8)
TIBC: 263 ug/dL (ref 250–450)
UIBC: 203 ug/dL

## 2015-08-11 LAB — FERRITIN: FERRITIN: 390 ng/mL — AB (ref 11–307)

## 2015-08-11 LAB — POCT HEMOGLOBIN-HEMACUE: Hemoglobin: 10.8 g/dL — ABNORMAL LOW (ref 12.0–15.0)

## 2015-08-11 MED ORDER — EPOETIN ALFA 10000 UNIT/ML IJ SOLN
10000.0000 [IU] | INTRAMUSCULAR | Status: DC
  Administered 2015-08-11: 10000 [IU] via SUBCUTANEOUS

## 2015-08-11 MED ORDER — EPOETIN ALFA 10000 UNIT/ML IJ SOLN
INTRAMUSCULAR | Status: AC
  Filled 2015-08-11: qty 1

## 2015-08-25 ENCOUNTER — Encounter (HOSPITAL_COMMUNITY)
Admission: RE | Admit: 2015-08-25 | Discharge: 2015-08-25 | Disposition: A | Discharge status: Home / Self Care | Payer: Medicare Other | Source: Ambulatory Visit | Attending: Nephrology | Admitting: Nephrology

## 2015-08-25 DIAGNOSIS — N183 Chronic kidney disease, stage 3 (moderate): Secondary | ICD-10-CM | POA: Diagnosis not present

## 2015-08-25 LAB — POCT HEMOGLOBIN-HEMACUE: HEMOGLOBIN: 11 g/dL — AB (ref 12.0–15.0)

## 2015-08-25 MED ORDER — EPOETIN ALFA 10000 UNIT/ML IJ SOLN
10000.0000 [IU] | INTRAMUSCULAR | Status: DC
  Administered 2015-08-25: 10000 [IU] via SUBCUTANEOUS

## 2015-08-25 MED ORDER — EPOETIN ALFA 10000 UNIT/ML IJ SOLN
INTRAMUSCULAR | Status: AC
  Administered 2015-08-25: 10000 [IU] via SUBCUTANEOUS
  Filled 2015-08-25: qty 1

## 2015-09-08 ENCOUNTER — Encounter (HOSPITAL_COMMUNITY)
Admission: RE | Admit: 2015-09-08 | Discharge: 2015-09-08 | Disposition: A | Discharge status: Home / Self Care | Payer: Medicare Other | Source: Ambulatory Visit | Attending: Nephrology | Admitting: Nephrology

## 2015-09-08 DIAGNOSIS — N183 Chronic kidney disease, stage 3 (moderate): Secondary | ICD-10-CM | POA: Diagnosis present

## 2015-09-08 DIAGNOSIS — Z5181 Encounter for therapeutic drug level monitoring: Secondary | ICD-10-CM | POA: Insufficient documentation

## 2015-09-08 DIAGNOSIS — Z79899 Other long term (current) drug therapy: Secondary | ICD-10-CM | POA: Diagnosis not present

## 2015-09-08 DIAGNOSIS — D631 Anemia in chronic kidney disease: Secondary | ICD-10-CM | POA: Diagnosis not present

## 2015-09-08 LAB — IRON AND TIBC
Iron: 94 ug/dL (ref 28–170)
Saturation Ratios: 34 % — ABNORMAL HIGH (ref 10.4–31.8)
TIBC: 279 ug/dL (ref 250–450)
UIBC: 185 ug/dL

## 2015-09-08 LAB — POCT HEMOGLOBIN-HEMACUE: Hemoglobin: 10.9 g/dL — ABNORMAL LOW (ref 12.0–15.0)

## 2015-09-08 LAB — FERRITIN: Ferritin: 356 ng/mL — ABNORMAL HIGH (ref 11–307)

## 2015-09-08 MED ORDER — EPOETIN ALFA 10000 UNIT/ML IJ SOLN
10000.0000 [IU] | INTRAMUSCULAR | Status: DC
  Administered 2015-09-08: 10000 [IU] via SUBCUTANEOUS

## 2015-09-08 MED ORDER — EPOETIN ALFA 10000 UNIT/ML IJ SOLN
INTRAMUSCULAR | Status: AC
  Filled 2015-09-08: qty 1

## 2015-09-22 ENCOUNTER — Encounter (HOSPITAL_COMMUNITY)
Admission: RE | Admit: 2015-09-22 | Discharge: 2015-09-22 | Disposition: A | Discharge status: Home / Self Care | Payer: Medicare Other | Source: Ambulatory Visit | Attending: Nephrology | Admitting: Nephrology

## 2015-09-22 DIAGNOSIS — N183 Chronic kidney disease, stage 3 (moderate): Secondary | ICD-10-CM | POA: Diagnosis not present

## 2015-09-22 LAB — POCT HEMOGLOBIN-HEMACUE: HEMOGLOBIN: 11.1 g/dL — AB (ref 12.0–15.0)

## 2015-09-22 MED ORDER — EPOETIN ALFA 10000 UNIT/ML IJ SOLN
INTRAMUSCULAR | Status: AC
  Filled 2015-09-22: qty 1

## 2015-09-22 MED ORDER — EPOETIN ALFA 10000 UNIT/ML IJ SOLN
10000.0000 [IU] | INTRAMUSCULAR | Status: DC
  Administered 2015-09-22: 10000 [IU] via SUBCUTANEOUS

## 2015-10-06 ENCOUNTER — Encounter (HOSPITAL_COMMUNITY): Payer: Medicare Other

## 2015-10-20 ENCOUNTER — Encounter (HOSPITAL_COMMUNITY)
Admission: RE | Admit: 2015-10-20 | Discharge: 2015-10-20 | Disposition: A | Discharge status: Home / Self Care | Payer: Medicare Other | Source: Ambulatory Visit | Attending: Nephrology | Admitting: Nephrology

## 2015-10-20 DIAGNOSIS — Z5181 Encounter for therapeutic drug level monitoring: Secondary | ICD-10-CM | POA: Insufficient documentation

## 2015-10-20 DIAGNOSIS — D631 Anemia in chronic kidney disease: Secondary | ICD-10-CM | POA: Diagnosis not present

## 2015-10-20 DIAGNOSIS — N183 Chronic kidney disease, stage 3 (moderate): Secondary | ICD-10-CM | POA: Diagnosis present

## 2015-10-20 DIAGNOSIS — Z79899 Other long term (current) drug therapy: Secondary | ICD-10-CM | POA: Insufficient documentation

## 2015-10-20 LAB — IRON AND TIBC
IRON: 42 ug/dL (ref 28–170)
Saturation Ratios: 16 % (ref 10.4–31.8)
TIBC: 266 ug/dL (ref 250–450)
UIBC: 224 ug/dL

## 2015-10-20 LAB — FERRITIN: FERRITIN: 617 ng/mL — AB (ref 11–307)

## 2015-10-20 LAB — POCT HEMOGLOBIN-HEMACUE: Hemoglobin: 11.5 g/dL — ABNORMAL LOW (ref 12.0–15.0)

## 2015-10-20 MED ORDER — EPOETIN ALFA 10000 UNIT/ML IJ SOLN
10000.0000 [IU] | INTRAMUSCULAR | Status: DC
  Administered 2015-10-20: 10000 [IU] via SUBCUTANEOUS

## 2015-10-20 MED ORDER — EPOETIN ALFA 10000 UNIT/ML IJ SOLN
INTRAMUSCULAR | Status: AC
  Administered 2015-10-20: 10000 [IU] via SUBCUTANEOUS
  Filled 2015-10-20: qty 1

## 2015-11-02 ENCOUNTER — Other Ambulatory Visit (HOSPITAL_COMMUNITY): Payer: Self-pay | Admitting: *Deleted

## 2015-11-03 ENCOUNTER — Ambulatory Visit (HOSPITAL_COMMUNITY)
Admission: RE | Admit: 2015-11-03 | Discharge: 2015-11-03 | Disposition: A | Discharge status: Home / Self Care | Payer: Medicare Other | Source: Ambulatory Visit | Attending: Nephrology | Admitting: Nephrology

## 2015-11-03 DIAGNOSIS — D631 Anemia in chronic kidney disease: Secondary | ICD-10-CM | POA: Insufficient documentation

## 2015-11-03 DIAGNOSIS — Z5181 Encounter for therapeutic drug level monitoring: Secondary | ICD-10-CM | POA: Diagnosis not present

## 2015-11-03 DIAGNOSIS — N183 Chronic kidney disease, stage 3 (moderate): Secondary | ICD-10-CM | POA: Diagnosis present

## 2015-11-03 DIAGNOSIS — Z79899 Other long term (current) drug therapy: Secondary | ICD-10-CM | POA: Insufficient documentation

## 2015-11-03 LAB — POCT HEMOGLOBIN-HEMACUE: HEMOGLOBIN: 11.4 g/dL — AB (ref 12.0–15.0)

## 2015-11-03 MED ORDER — EPOETIN ALFA 10000 UNIT/ML IJ SOLN
INTRAMUSCULAR | Status: AC
  Administered 2015-11-03: 10000 [IU]
  Filled 2015-11-03: qty 1

## 2015-11-03 MED ORDER — SODIUM CHLORIDE 0.9 % IV SOLN
510.0000 mg | INTRAVENOUS | Status: DC
  Filled 2015-11-03: qty 17

## 2015-11-03 MED ORDER — EPOETIN ALFA 10000 UNIT/ML IJ SOLN: 10000.0000 [IU] | INTRAMUSCULAR | Status: DC

## 2015-11-17 ENCOUNTER — Encounter (HOSPITAL_COMMUNITY): Payer: Medicare Other

## 2015-11-24 ENCOUNTER — Encounter (HOSPITAL_COMMUNITY): Payer: Medicare Other

## 2015-11-30 ENCOUNTER — Other Ambulatory Visit (HOSPITAL_COMMUNITY): Payer: Self-pay | Admitting: *Deleted

## 2015-12-01 ENCOUNTER — Encounter (HOSPITAL_COMMUNITY)
Admission: RE | Admit: 2015-12-01 | Discharge: 2015-12-01 | Disposition: A | Discharge status: Home / Self Care | Payer: Medicare Other | Source: Ambulatory Visit | Attending: Nephrology | Admitting: Nephrology

## 2015-12-01 DIAGNOSIS — D631 Anemia in chronic kidney disease: Secondary | ICD-10-CM | POA: Diagnosis not present

## 2015-12-01 DIAGNOSIS — Z5181 Encounter for therapeutic drug level monitoring: Secondary | ICD-10-CM | POA: Insufficient documentation

## 2015-12-01 DIAGNOSIS — N183 Chronic kidney disease, stage 3 (moderate): Secondary | ICD-10-CM | POA: Insufficient documentation

## 2015-12-01 DIAGNOSIS — Z79899 Other long term (current) drug therapy: Secondary | ICD-10-CM | POA: Insufficient documentation

## 2015-12-01 LAB — IRON AND TIBC
Iron: 263 ug/dL — ABNORMAL HIGH (ref 28–170)
SATURATION RATIOS: 106 % — AB (ref 10.4–31.8)
TIBC: 249 ug/dL — ABNORMAL LOW (ref 250–450)

## 2015-12-01 LAB — RENAL FUNCTION PANEL
ALBUMIN: 2.5 g/dL — AB (ref 3.5–5.0)
Anion gap: 11 (ref 5–15)
BUN: 48 mg/dL — AB (ref 6–20)
CO2: 24 mmol/L (ref 22–32)
CREATININE: 1.67 mg/dL — AB (ref 0.44–1.00)
Calcium: 9 mg/dL (ref 8.9–10.3)
Chloride: 106 mmol/L (ref 101–111)
GFR calc Af Amer: 35 mL/min — ABNORMAL LOW (ref >60)
GFR, EST NON AFRICAN AMERICAN: 30 mL/min — AB (ref >60)
GLUCOSE: 165 mg/dL — AB (ref 65–99)
PHOSPHORUS: 4.2 mg/dL (ref 2.5–4.6)
POTASSIUM: 4.3 mmol/L (ref 3.5–5.1)
Sodium: 141 mmol/L (ref 135–145)

## 2015-12-01 LAB — MAGNESIUM: MAGNESIUM: 1.6 mg/dL — AB (ref 1.7–2.4)

## 2015-12-01 LAB — FERRITIN: FERRITIN: 497 ng/mL — AB (ref 11–307)

## 2015-12-01 LAB — POCT HEMOGLOBIN-HEMACUE: Hemoglobin: 11.6 g/dL — ABNORMAL LOW (ref 12.0–15.0)

## 2015-12-01 MED ORDER — EPOETIN ALFA 10000 UNIT/ML IJ SOLN: 10000.0000 [IU] | INTRAMUSCULAR | Status: DC

## 2015-12-01 MED ORDER — SODIUM CHLORIDE 0.9 % IV SOLN
510.0000 mg | INTRAVENOUS | Status: DC
  Administered 2015-12-01: 510 mg via INTRAVENOUS
  Filled 2015-12-01: qty 17

## 2015-12-15 ENCOUNTER — Ambulatory Visit (HOSPITAL_COMMUNITY)
Admission: RE | Admit: 2015-12-15 | Discharge: 2015-12-15 | Disposition: A | Discharge status: Home / Self Care | Payer: Medicare Other | Source: Ambulatory Visit | Attending: Nephrology | Admitting: Nephrology

## 2015-12-15 DIAGNOSIS — D631 Anemia in chronic kidney disease: Secondary | ICD-10-CM | POA: Diagnosis not present

## 2015-12-15 DIAGNOSIS — Z5181 Encounter for therapeutic drug level monitoring: Secondary | ICD-10-CM | POA: Insufficient documentation

## 2015-12-15 DIAGNOSIS — Z79899 Other long term (current) drug therapy: Secondary | ICD-10-CM | POA: Diagnosis not present

## 2015-12-15 DIAGNOSIS — N183 Chronic kidney disease, stage 3 (moderate): Secondary | ICD-10-CM | POA: Diagnosis present

## 2015-12-15 LAB — POCT HEMOGLOBIN-HEMACUE: HEMOGLOBIN: 11.7 g/dL — AB (ref 12.0–15.0)

## 2015-12-15 MED ORDER — EPOETIN ALFA 10000 UNIT/ML IJ SOLN: 10000.0000 [IU] | INTRAMUSCULAR | Status: DC

## 2015-12-29 ENCOUNTER — Encounter (HOSPITAL_COMMUNITY)
Admission: RE | Admit: 2015-12-29 | Discharge: 2015-12-29 | Disposition: A | Discharge status: Home / Self Care | Payer: Medicare Other | Source: Ambulatory Visit | Attending: Nephrology | Admitting: Nephrology

## 2015-12-29 ENCOUNTER — Encounter (HOSPITAL_COMMUNITY): Payer: Medicare Other

## 2015-12-29 DIAGNOSIS — D631 Anemia in chronic kidney disease: Secondary | ICD-10-CM | POA: Diagnosis not present

## 2015-12-29 DIAGNOSIS — Z5181 Encounter for therapeutic drug level monitoring: Secondary | ICD-10-CM | POA: Diagnosis not present

## 2015-12-29 DIAGNOSIS — Z79899 Other long term (current) drug therapy: Secondary | ICD-10-CM | POA: Insufficient documentation

## 2015-12-29 DIAGNOSIS — N183 Chronic kidney disease, stage 3 (moderate): Secondary | ICD-10-CM | POA: Diagnosis present

## 2015-12-29 LAB — RENAL FUNCTION PANEL
ANION GAP: 11 (ref 5–15)
Albumin: 2.8 g/dL — ABNORMAL LOW (ref 3.5–5.0)
BUN: 60 mg/dL — AB (ref 6–20)
CHLORIDE: 105 mmol/L (ref 101–111)
CO2: 23 mmol/L (ref 22–32)
Calcium: 9 mg/dL (ref 8.9–10.3)
Creatinine, Ser: 1.96 mg/dL — ABNORMAL HIGH (ref 0.44–1.00)
GFR calc Af Amer: 29 mL/min — ABNORMAL LOW (ref >60)
GFR, EST NON AFRICAN AMERICAN: 25 mL/min — AB (ref >60)
Glucose, Bld: 173 mg/dL — ABNORMAL HIGH (ref 65–99)
PHOSPHORUS: 3.8 mg/dL (ref 2.5–4.6)
POTASSIUM: 4.8 mmol/L (ref 3.5–5.1)
Sodium: 139 mmol/L (ref 135–145)

## 2015-12-29 LAB — FERRITIN: Ferritin: 820 ng/mL — ABNORMAL HIGH (ref 11–307)

## 2015-12-29 LAB — IRON AND TIBC
IRON: 37 ug/dL (ref 28–170)
Saturation Ratios: 14 % (ref 10.4–31.8)
TIBC: 273 ug/dL (ref 250–450)
UIBC: 236 ug/dL

## 2015-12-29 LAB — MAGNESIUM: MAGNESIUM: 1.7 mg/dL (ref 1.7–2.4)

## 2015-12-29 LAB — POCT HEMOGLOBIN-HEMACUE: HEMOGLOBIN: 11.3 g/dL — AB (ref 12.0–15.0)

## 2015-12-29 MED ORDER — EPOETIN ALFA 10000 UNIT/ML IJ SOLN
10000.0000 [IU] | INTRAMUSCULAR | Status: DC
  Administered 2015-12-29: 10000 [IU] via SUBCUTANEOUS

## 2015-12-29 MED ORDER — EPOETIN ALFA 10000 UNIT/ML IJ SOLN
INTRAMUSCULAR | Status: AC
  Filled 2015-12-29: qty 1

## 2016-01-10 ENCOUNTER — Encounter (HOSPITAL_COMMUNITY): Payer: Medicare Other

## 2016-01-12 ENCOUNTER — Other Ambulatory Visit (HOSPITAL_COMMUNITY): Payer: Self-pay | Admitting: *Deleted

## 2016-01-13 ENCOUNTER — Encounter (HOSPITAL_COMMUNITY)
Admission: RE | Admit: 2016-01-13 | Discharge: 2016-01-13 | Disposition: A | Discharge status: Home / Self Care | Payer: Medicare Other | Source: Ambulatory Visit | Attending: Nephrology | Admitting: Nephrology

## 2016-01-13 DIAGNOSIS — N183 Chronic kidney disease, stage 3 (moderate): Secondary | ICD-10-CM | POA: Diagnosis not present

## 2016-01-13 MED ORDER — EPOETIN ALFA 10000 UNIT/ML IJ SOLN
INTRAMUSCULAR | Status: AC
  Filled 2016-01-13: qty 1

## 2016-01-13 MED ORDER — SODIUM CHLORIDE 0.9 % IV SOLN
510.0000 mg | INTRAVENOUS | Status: DC
Start: 2016-01-13 — End: 2016-01-14
  Administered 2016-01-13: 510 mg via INTRAVENOUS
  Filled 2016-01-13: qty 17

## 2016-01-13 MED ORDER — EPOETIN ALFA 10000 UNIT/ML IJ SOLN
10000.0000 [IU] | INTRAMUSCULAR | Status: DC
  Administered 2016-01-13: 10000 [IU] via SUBCUTANEOUS

## 2016-01-16 LAB — POCT HEMOGLOBIN-HEMACUE: HEMOGLOBIN: 11.4 g/dL — AB (ref 12.0–15.0)

## 2016-02-02 ENCOUNTER — Encounter (HOSPITAL_COMMUNITY): Payer: Medicare Other

## 2016-02-12 ENCOUNTER — Inpatient Hospital Stay (HOSPITAL_COMMUNITY)
Admission: EM | Admit: 2016-02-12 | Discharge: 2016-02-25 | DRG: 682 | Disposition: E | Payer: Medicare Other | Attending: Internal Medicine | Admitting: Internal Medicine

## 2016-02-12 ENCOUNTER — Other Ambulatory Visit (HOSPITAL_COMMUNITY): Payer: Self-pay

## 2016-02-12 ENCOUNTER — Other Ambulatory Visit: Payer: Self-pay

## 2016-02-12 ENCOUNTER — Emergency Department (HOSPITAL_COMMUNITY): Payer: Medicare Other

## 2016-02-12 ENCOUNTER — Encounter (HOSPITAL_COMMUNITY): Payer: Self-pay | Admitting: Emergency Medicine

## 2016-02-12 DIAGNOSIS — Z961 Presence of intraocular lens: Secondary | ICD-10-CM | POA: Diagnosis present

## 2016-02-12 DIAGNOSIS — Z66 Do not resuscitate: Secondary | ICD-10-CM | POA: Diagnosis not present

## 2016-02-12 DIAGNOSIS — I2781 Cor pulmonale (chronic): Secondary | ICD-10-CM | POA: Diagnosis present

## 2016-02-12 DIAGNOSIS — I10 Essential (primary) hypertension: Secondary | ICD-10-CM | POA: Diagnosis not present

## 2016-02-12 DIAGNOSIS — R627 Adult failure to thrive: Secondary | ICD-10-CM | POA: Diagnosis present

## 2016-02-12 DIAGNOSIS — F329 Major depressive disorder, single episode, unspecified: Secondary | ICD-10-CM | POA: Diagnosis present

## 2016-02-12 DIAGNOSIS — Z7952 Long term (current) use of systemic steroids: Secondary | ICD-10-CM | POA: Diagnosis not present

## 2016-02-12 DIAGNOSIS — N183 Chronic kidney disease, stage 3 unspecified: Secondary | ICD-10-CM | POA: Diagnosis present

## 2016-02-12 DIAGNOSIS — Z87891 Personal history of nicotine dependence: Secondary | ICD-10-CM | POA: Diagnosis not present

## 2016-02-12 DIAGNOSIS — K219 Gastro-esophageal reflux disease without esophagitis: Secondary | ICD-10-CM | POA: Diagnosis present

## 2016-02-12 DIAGNOSIS — G4733 Obstructive sleep apnea (adult) (pediatric): Secondary | ICD-10-CM | POA: Diagnosis present

## 2016-02-12 DIAGNOSIS — Z79891 Long term (current) use of opiate analgesic: Secondary | ICD-10-CM | POA: Diagnosis not present

## 2016-02-12 DIAGNOSIS — E872 Acidosis: Secondary | ICD-10-CM | POA: Diagnosis present

## 2016-02-12 DIAGNOSIS — Z882 Allergy status to sulfonamides status: Secondary | ICD-10-CM | POA: Diagnosis not present

## 2016-02-12 DIAGNOSIS — N390 Urinary tract infection, site not specified: Secondary | ICD-10-CM | POA: Diagnosis present

## 2016-02-12 DIAGNOSIS — Z8701 Personal history of pneumonia (recurrent): Secondary | ICD-10-CM

## 2016-02-12 DIAGNOSIS — N184 Chronic kidney disease, stage 4 (severe): Secondary | ICD-10-CM | POA: Diagnosis present

## 2016-02-12 DIAGNOSIS — Z833 Family history of diabetes mellitus: Secondary | ICD-10-CM

## 2016-02-12 DIAGNOSIS — Z792 Long term (current) use of antibiotics: Secondary | ICD-10-CM | POA: Diagnosis not present

## 2016-02-12 DIAGNOSIS — L02415 Cutaneous abscess of right lower limb: Secondary | ICD-10-CM | POA: Diagnosis present

## 2016-02-12 DIAGNOSIS — R0989 Other specified symptoms and signs involving the circulatory and respiratory systems: Secondary | ICD-10-CM | POA: Diagnosis present

## 2016-02-12 DIAGNOSIS — Z79899 Other long term (current) drug therapy: Secondary | ICD-10-CM

## 2016-02-12 DIAGNOSIS — D649 Anemia, unspecified: Secondary | ICD-10-CM | POA: Diagnosis present

## 2016-02-12 DIAGNOSIS — J9622 Acute and chronic respiratory failure with hypercapnia: Secondary | ICD-10-CM | POA: Diagnosis present

## 2016-02-12 DIAGNOSIS — Z800 Family history of malignant neoplasm of digestive organs: Secondary | ICD-10-CM | POA: Diagnosis not present

## 2016-02-12 DIAGNOSIS — M109 Gout, unspecified: Secondary | ICD-10-CM | POA: Diagnosis present

## 2016-02-12 DIAGNOSIS — J9621 Acute and chronic respiratory failure with hypoxia: Secondary | ICD-10-CM | POA: Diagnosis present

## 2016-02-12 DIAGNOSIS — Z515 Encounter for palliative care: Secondary | ICD-10-CM | POA: Diagnosis not present

## 2016-02-12 DIAGNOSIS — Z86711 Personal history of pulmonary embolism: Secondary | ICD-10-CM | POA: Diagnosis not present

## 2016-02-12 DIAGNOSIS — E1140 Type 2 diabetes mellitus with diabetic neuropathy, unspecified: Secondary | ICD-10-CM | POA: Diagnosis present

## 2016-02-12 DIAGNOSIS — E785 Hyperlipidemia, unspecified: Secondary | ICD-10-CM | POA: Diagnosis present

## 2016-02-12 DIAGNOSIS — N179 Acute kidney failure, unspecified: Principal | ICD-10-CM

## 2016-02-12 DIAGNOSIS — I130 Hypertensive heart and chronic kidney disease with heart failure and stage 1 through stage 4 chronic kidney disease, or unspecified chronic kidney disease: Secondary | ICD-10-CM | POA: Diagnosis present

## 2016-02-12 DIAGNOSIS — L03116 Cellulitis of left lower limb: Secondary | ICD-10-CM | POA: Diagnosis present

## 2016-02-12 DIAGNOSIS — Z86718 Personal history of other venous thrombosis and embolism: Secondary | ICD-10-CM

## 2016-02-12 DIAGNOSIS — I5032 Chronic diastolic (congestive) heart failure: Secondary | ICD-10-CM | POA: Diagnosis not present

## 2016-02-12 DIAGNOSIS — Z9842 Cataract extraction status, left eye: Secondary | ICD-10-CM | POA: Diagnosis not present

## 2016-02-12 DIAGNOSIS — Z825 Family history of asthma and other chronic lower respiratory diseases: Secondary | ICD-10-CM | POA: Diagnosis not present

## 2016-02-12 DIAGNOSIS — G9340 Encephalopathy, unspecified: Secondary | ICD-10-CM | POA: Diagnosis present

## 2016-02-12 DIAGNOSIS — Z880 Allergy status to penicillin: Secondary | ICD-10-CM

## 2016-02-12 DIAGNOSIS — E1122 Type 2 diabetes mellitus with diabetic chronic kidney disease: Secondary | ICD-10-CM | POA: Diagnosis present

## 2016-02-12 DIAGNOSIS — Z8249 Family history of ischemic heart disease and other diseases of the circulatory system: Secondary | ICD-10-CM

## 2016-02-12 DIAGNOSIS — E875 Hyperkalemia: Secondary | ICD-10-CM | POA: Diagnosis present

## 2016-02-12 DIAGNOSIS — R4182 Altered mental status, unspecified: Secondary | ICD-10-CM

## 2016-02-12 DIAGNOSIS — Z6841 Body Mass Index (BMI) 40.0 and over, adult: Secondary | ICD-10-CM | POA: Diagnosis not present

## 2016-02-12 DIAGNOSIS — Z9841 Cataract extraction status, right eye: Secondary | ICD-10-CM | POA: Diagnosis not present

## 2016-02-12 DIAGNOSIS — N19 Unspecified kidney failure: Secondary | ICD-10-CM

## 2016-02-12 DIAGNOSIS — J9601 Acute respiratory failure with hypoxia: Secondary | ICD-10-CM | POA: Diagnosis not present

## 2016-02-12 DIAGNOSIS — I5033 Acute on chronic diastolic (congestive) heart failure: Secondary | ICD-10-CM | POA: Diagnosis present

## 2016-02-12 DIAGNOSIS — Z881 Allergy status to other antibiotic agents status: Secondary | ICD-10-CM

## 2016-02-12 LAB — BLOOD GAS, ARTERIAL
ACID-BASE DEFICIT: 8 mmol/L — AB (ref 0.0–2.0)
Bicarbonate: 19.9 mEq/L — ABNORMAL LOW (ref 20.0–24.0)
DRAWN BY: 10552
O2 Content: 3 L/min
O2 Saturation: 91.3 %
PCO2 ART: 64.5 mmHg — AB (ref 35.0–45.0)
Patient temperature: 98.6
TCO2: 21.9 mmol/L (ref 0–100)
pH, Arterial: 7.118 — CL (ref 7.350–7.450)
pO2, Arterial: 74.4 mmHg — ABNORMAL LOW (ref 80.0–100.0)

## 2016-02-12 LAB — URINE MICROSCOPIC-ADD ON

## 2016-02-12 LAB — COMPREHENSIVE METABOLIC PANEL
ALBUMIN: 2.7 g/dL — AB (ref 3.5–5.0)
ALT: 22 U/L (ref 14–54)
AST: 19 U/L (ref 15–41)
Alkaline Phosphatase: 83 U/L (ref 38–126)
Anion gap: 12 (ref 5–15)
BUN: 59 mg/dL — AB (ref 6–20)
CHLORIDE: 101 mmol/L (ref 101–111)
CO2: 20 mmol/L — ABNORMAL LOW (ref 22–32)
Calcium: 8.4 mg/dL — ABNORMAL LOW (ref 8.9–10.3)
Creatinine, Ser: 5.33 mg/dL — ABNORMAL HIGH (ref 0.44–1.00)
GFR calc Af Amer: 9 mL/min — ABNORMAL LOW (ref >60)
GFR, EST NON AFRICAN AMERICAN: 7 mL/min — AB (ref >60)
Glucose, Bld: 133 mg/dL — ABNORMAL HIGH (ref 65–99)
POTASSIUM: 5.2 mmol/L — AB (ref 3.5–5.1)
SODIUM: 133 mmol/L — AB (ref 135–145)
Total Bilirubin: 0.6 mg/dL (ref 0.3–1.2)
Total Protein: 7.8 g/dL (ref 6.5–8.1)

## 2016-02-12 LAB — I-STAT CHEM 8, ED
BUN: 59 mg/dL — ABNORMAL HIGH (ref 6–20)
CREATININE: 6.1 mg/dL — AB (ref 0.44–1.00)
Calcium, Ion: 1.03 mmol/L — ABNORMAL LOW (ref 1.13–1.30)
Chloride: 104 mmol/L (ref 101–111)
Glucose, Bld: 132 mg/dL — ABNORMAL HIGH (ref 65–99)
HEMATOCRIT: 33 % — AB (ref 36.0–46.0)
HEMOGLOBIN: 11.2 g/dL — AB (ref 12.0–15.0)
Potassium: 5.1 mmol/L (ref 3.5–5.1)
SODIUM: 134 mmol/L — AB (ref 135–145)
TCO2: 21 mmol/L (ref 0–100)

## 2016-02-12 LAB — POCT I-STAT 3, ART BLOOD GAS (G3+)
ACID-BASE DEFICIT: 10 mmol/L — AB (ref 0.0–2.0)
BICARBONATE: 20.3 meq/L (ref 20.0–24.0)
O2 Saturation: 95 %
TCO2: 22 mmol/L (ref 0–100)
pCO2 arterial: 69.2 mmHg (ref 35.0–45.0)
pH, Arterial: 7.078 — CL (ref 7.350–7.450)
pO2, Arterial: 108 mmHg — ABNORMAL HIGH (ref 80.0–100.0)

## 2016-02-12 LAB — CBC WITH DIFFERENTIAL/PLATELET
Basophils Absolute: 0 10*3/uL (ref 0.0–0.1)
Basophils Relative: 0 %
EOS PCT: 6 %
Eosinophils Absolute: 0.6 10*3/uL (ref 0.0–0.7)
HCT: 30.7 % — ABNORMAL LOW (ref 36.0–46.0)
HEMOGLOBIN: 9 g/dL — AB (ref 12.0–15.0)
LYMPHS ABS: 1.3 10*3/uL (ref 0.7–4.0)
LYMPHS PCT: 15 %
MCH: 26.9 pg (ref 26.0–34.0)
MCHC: 29.3 g/dL — ABNORMAL LOW (ref 30.0–36.0)
MCV: 91.6 fL (ref 78.0–100.0)
Monocytes Absolute: 0.5 10*3/uL (ref 0.1–1.0)
Monocytes Relative: 6 %
Neutro Abs: 6.5 10*3/uL (ref 1.7–7.7)
Neutrophils Relative %: 73 %
Platelets: 271 10*3/uL (ref 150–400)
RBC: 3.35 MIL/uL — AB (ref 3.87–5.11)
RDW: 15.1 % (ref 11.5–15.5)
WBC: 8.9 10*3/uL (ref 4.0–10.5)

## 2016-02-12 LAB — URINALYSIS, ROUTINE W REFLEX MICROSCOPIC
Glucose, UA: NEGATIVE mg/dL
Ketones, ur: NEGATIVE mg/dL
NITRITE: POSITIVE — AB
PH: 5.5 (ref 5.0–8.0)
Protein, ur: >300 mg/dL — AB
SPECIFIC GRAVITY, URINE: 1.025 (ref 1.005–1.030)

## 2016-02-12 LAB — I-STAT CG4 LACTIC ACID, ED: Lactic Acid, Venous: 0.6 mmol/L (ref 0.5–2.0)

## 2016-02-12 LAB — MRSA PCR SCREENING: MRSA by PCR: NEGATIVE

## 2016-02-12 LAB — CBG MONITORING, ED: Glucose-Capillary: 130 mg/dL — ABNORMAL HIGH (ref 65–99)

## 2016-02-12 LAB — GLUCOSE, CAPILLARY
Glucose-Capillary: 149 mg/dL — ABNORMAL HIGH (ref 65–99)
Glucose-Capillary: 152 mg/dL — ABNORMAL HIGH (ref 65–99)
Glucose-Capillary: 151 mg/dL — ABNORMAL HIGH (ref 65–99)
Glucose-Capillary: 122 mg/dL — ABNORMAL HIGH (ref 65–99)

## 2016-02-12 LAB — ETHANOL: Alcohol, Ethyl (B): <5 mg/dL (ref <5)

## 2016-02-12 LAB — BRAIN NATRIURETIC PEPTIDE: B NATRIURETIC PEPTIDE 5: 253.5 pg/mL — AB (ref 0.0–100.0)

## 2016-02-12 LAB — AMMONIA: AMMONIA: 32 umol/L (ref 9–35)

## 2016-02-12 MED ORDER — SODIUM CHLORIDE 0.9 % IV SOLN
INTRAVENOUS | Status: AC
  Administered 2016-02-12: 08:00:00 via INTRAVENOUS

## 2016-02-12 MED ORDER — VANCOMYCIN HCL 10 G IV SOLR: 2000.0000 mg | INTRAVENOUS | Status: DC

## 2016-02-12 MED ORDER — SODIUM CHLORIDE 0.9 % IV BOLUS (SEPSIS)
1000.0000 mL | Freq: Once | INTRAVENOUS | Status: AC
Start: 2016-02-12 — End: 2016-02-12
  Administered 2016-02-12: 1000 mL via INTRAVENOUS

## 2016-02-12 MED ORDER — SODIUM CHLORIDE 0.9% FLUSH
3.0000 mL | Freq: Two times a day (BID) | INTRAVENOUS | Status: DC
  Administered 2016-02-12: 3 mL via INTRAVENOUS

## 2016-02-12 MED ORDER — SODIUM CHLORIDE 0.9 % IV SOLN
INTRAVENOUS | Status: DC
  Administered 2016-02-12: 16:00:00 via INTRAVENOUS

## 2016-02-12 MED ORDER — ONDANSETRON HCL 4 MG/2ML IJ SOLN: 4.0000 mg | Freq: Four times a day (QID) | INTRAMUSCULAR | Status: DC | PRN

## 2016-02-12 MED ORDER — LORAZEPAM 2 MG/ML IJ SOLN: 1.0000 mg | INTRAMUSCULAR | Status: DC | PRN

## 2016-02-12 MED ORDER — ONDANSETRON HCL 4 MG PO TABS: 4.0000 mg | ORAL_TABLET | Freq: Four times a day (QID) | ORAL | Status: DC | PRN

## 2016-02-12 MED ORDER — METOPROLOL TARTRATE 5 MG/5ML IV SOLN: 2.5000 mg | Freq: Three times a day (TID) | INTRAVENOUS | Status: DC | PRN

## 2016-02-12 MED ORDER — INSULIN ASPART 100 UNIT/ML ~~LOC~~ SOLN
0.0000 [IU] | Freq: Three times a day (TID) | SUBCUTANEOUS | Status: DC
  Administered 2016-02-12 (×2): 2 [IU] via SUBCUTANEOUS

## 2016-02-12 MED ORDER — PIPERACILLIN-TAZOBACTAM IN DEX 2-0.25 GM/50ML IV SOLN
2.2500 g | Freq: Three times a day (TID) | INTRAVENOUS | Status: DC
  Administered 2016-02-12 (×2): 2.25 g via INTRAVENOUS
  Filled 2016-02-12 (×5): qty 50

## 2016-02-12 MED ORDER — GLYCOPYRROLATE 0.2 MG/ML IJ SOLN
0.1000 mg | INTRAMUSCULAR | Status: DC | PRN
  Administered 2016-02-13: 0.1 mg via INTRAVENOUS
  Filled 2016-02-12: qty 0.5
  Filled 2016-02-12: qty 1

## 2016-02-12 MED ORDER — METOPROLOL TARTRATE 5 MG/5ML IV SOLN: 5.0000 mg | Freq: Three times a day (TID) | INTRAVENOUS | Status: DC

## 2016-02-12 MED ORDER — PIPERACILLIN-TAZOBACTAM 3.375 G IVPB 30 MIN
3.3750 g | Freq: Once | INTRAVENOUS | Status: AC
  Administered 2016-02-12: 3.375 g via INTRAVENOUS
  Filled 2016-02-12: qty 50

## 2016-02-12 MED ORDER — MIDAZOLAM HCL 2 MG/2ML IJ SOLN
INTRAMUSCULAR | Status: AC
  Filled 2016-02-12: qty 4

## 2016-02-12 MED ORDER — SODIUM CHLORIDE 0.9 % IV SOLN
2000.0000 mg | Freq: Once | INTRAVENOUS | Status: AC
  Administered 2016-02-12: 2000 mg via INTRAVENOUS
  Filled 2016-02-12: qty 2000

## 2016-02-12 MED ORDER — DEXTROSE 5 % IV SOLN
1.0000 mg/h | INTRAVENOUS | Status: DC
  Administered 2016-02-12: 4 mg/h via INTRAVENOUS
  Filled 2016-02-12: qty 10

## 2016-02-12 MED ORDER — HEPARIN SODIUM (PORCINE) 5000 UNIT/ML IJ SOLN
5000.0000 [IU] | Freq: Two times a day (BID) | INTRAMUSCULAR | Status: DC
  Administered 2016-02-12 (×2): 5000 [IU] via SUBCUTANEOUS
  Filled 2016-02-12 (×2): qty 1

## 2016-02-12 MED ORDER — ACETAMINOPHEN 650 MG RE SUPP: 650.0000 mg | Freq: Four times a day (QID) | RECTAL | Status: DC | PRN

## 2016-02-12 MED ORDER — PIPERACILLIN-TAZOBACTAM IN DEX 2-0.25 GM/50ML IV SOLN
2.2500 g | Freq: Three times a day (TID) | INTRAVENOUS | Status: DC
  Filled 2016-02-12 (×3): qty 50

## 2016-02-12 MED ORDER — ACETAMINOPHEN 325 MG PO TABS: 650.0000 mg | ORAL_TABLET | Freq: Four times a day (QID) | ORAL | Status: DC | PRN

## 2016-02-12 MED ORDER — FENTANYL CITRATE (PF) 100 MCG/2ML IJ SOLN
INTRAMUSCULAR | Status: AC
  Filled 2016-02-12: qty 2

## 2016-02-12 MED ORDER — HYDRALAZINE HCL 20 MG/ML IJ SOLN: 10.0000 mg | Freq: Four times a day (QID) | INTRAMUSCULAR | Status: DC | PRN

## 2016-02-12 NOTE — Progress Notes (Signed)
Note pt is DNR and moving towards comfort care.  Nothing further to suggest, will sign off.  Please call as needed.    Kelly Splinter MD [employer] pager [phone]    cell [phone] 02/03/2016, 7:10 PM

## 2016-02-12 NOTE — Progress Notes (Signed)
This is a no charge note  Pending on admission per Dr. Tyrone Nine  70 year old lady, from facility, with past medical history of hypertension, hyperlipidemia, diabetes mellitus, chronic heart failure, GERD, PE/DVT, CKD-III, who presents with altered mental status, shortness of breath and oxygen saturation desaturation to 80%. Positive urinalysis, worsening renal function with creatinine up 1.97--> 5.33, potassium 5.2, x-ray showed vascular congestion, WBC 8.7, no fever. CT head is negative for acute intracranial abnormalities. IV vancomycin and Zosyn was started. Pt is accepted to tele bed as inpt.   Ivor Costa, MD  [facility] Pager [phone]  If 7PM-7AM, please contact night-coverage [email or web address] Password [email or web address] 02/14/2016, 7:02 AM

## 2016-02-12 NOTE — Progress Notes (Signed)
New Admission Note:  Transfer from Ed  Arrival Method:  stretcher Mental Orientation: a/o 1 sleepy Telemetry: placed Assessment: Completed Skin: IV:LH ns 125 Pain: none Tubes: none Safety Measures: Safety Fall Prevention Plan has been given, discussed and signed Admission: Completed Unit Orientation: Patient has been orientated to the room, unit and staff.  Family: none present  Orders have been reviewed and implemented. Will continue to monitor the patient. Call light has been placed within reach and bed alarm has been activated.   Retta Mac BSN, RN

## 2016-02-12 NOTE — ED Notes (Signed)
Taken to CT at this time. 

## 2016-02-12 NOTE — Consult Note (Addendum)
Name: Kelly [name] MRN: [id number] DOB: 1946-03-11    ADMISSION DATE:  02/12/2016 CONSULTATION DATE:  6/18  REFERRING MD :  Janne Napoleon   CHIEF COMPLAINT:  Respiratory failure and altered mental status   BRIEF PATIENT DESCRIPTION:  Admitted w/ acute hypercarbic encephalopathy, recurrent acute on chronic hypoxic and hypercarbic respiratory failure & acute on chronic renal failure.   SIGNIFICANT EVENTS    STUDIES:  CT head 6/18: negative   HISTORY OF PRESENT ILLNESS:   70 y.o. female with multiple medical problems including: morbid obesity, hypertension, diabetes, chronic heart failure, PE/DVT, chronic kidney disease. Recently hospitalized at [facility] where she  required emergent dialysis. Patient transferred here from [facility] on 6/18 am for altered mental status and hypoxia with O2 saturation of 85% on room air.  On evaluation in ER found to have acute on chronic renal failure, with acute hypercarbia. She was placed on NIPPV and had little improvement in her abg and remained obtunded. Because of this he was transferred urgently to the intensive care PCCM as well as renal services have been consulted.   PAST MEDICAL HISTORY :   has a past medical history of Hypertension; Hyperlipidemia; CHF (congestive heart failure) ([place]); GERD (gastroesophageal reflux disease); Depression; Lymphedema of leg; Pulmonary embolism ([place]) (02/18/14); Chronic indwelling Foley catheter; Heart murmur; DVT (deep venous thrombosis) ([place]) (01/2014); Pneumonia (1990's X 2); Sleep apnea; Anemia; History of blood transfusion (1990's X 2); Migraines; Arthritis; Gout; Urinary incontinence; Chronic kidney disease (CKD), stage III (moderate); Neuropathy ([place]); and Diabetes mellitus.  has past surgical history that includes Shoulder open rotator cuff repair (Right, ~ 2000); Knee arthroscopy (Right, ?1980's); Temporal artery biopsy / ligation (Right, ?1990's); Colonoscopy with propofol (09/23/2012); Panniculectomy (Left,  02/08/2014); Irrigation and debridement abscess (Left, 02/24/2014); I&D extremity (Left, 03/03/2014); Incision and drainage of wound (Left, 03/16/2014); Incision and drainage of wound (Left, 03/25/2014); Incision and drainage of wound (Left, 03/31/2014); Cataract extraction w/ intraocular lens implant (Right, 2014); Vena cava filter placement (02/2014); Incision and drainage of wound (Left, 04/07/2014); Incision and drainage of wound (Left, 04/14/2014); Application of a-cell of extremity (Left, 04/14/2014); Minor application of wound vac (Left, 04/14/2014); Incision and drainage of wound (Left, 04/21/2014); Knee arthroscopy (Left, 12/17/2014); Irrigation and debridement knee (Left, 12/17/2014); and Knee arthroscopy (Left, 12/20/2014). Prior to Admission medications   Medication Sig Start Date End Date Taking? Authorizing Provider  acetaminophen (TYLENOL) 325 MG tablet Take 650 mg by mouth every 4 (four) hours as needed for mild pain.   Yes Historical Provider, MD  Amino Acids-Protein Hydrolys (FEEDING SUPPLEMENT, PRO-STAT SUGAR FREE 64,) LIQD Take 30 mLs by mouth 2 (two) times daily.   Yes Historical Provider, MD  amLODipine (NORVASC) 10 MG tablet Take 1 tablet (10 mg total) by mouth daily. 11/26/14  Yes Christina P Rama, MD  bisacodyl (DULCOLAX) 10 MG suppository Place 10 mg rectally as needed for moderate constipation.   Yes Historical Provider, MD  calcitRIOL (ROCALTROL) 0.25 MCG capsule Take 0.25 mcg by mouth daily.   Yes Historical Provider, MD  cloNIDine (CATAPRES) 0.2 MG tablet Take 0.2 mg by mouth 2 (two) times daily.   Yes Historical Provider, MD  colchicine 0.6 MG tablet Take 0.6 mg by mouth daily.  11/09/14  Yes Historical Provider, MD  dicyclomine (BENTYL) 10 MG capsule Take 10 mg by mouth 2 (two) times daily.   Yes Historical Provider, MD  doxazosin (CARDURA) 2 MG tablet Take 2 mg by mouth daily.   Yes Historical Provider, MD  epoetin  alfa (EPOGEN,PROCRIT) 57846 UNIT/ML injection Inject 10,000 Units into the  vein 3 (three) times a week.   Yes Historical Provider, MD  ferrous sulfate 325 (65 FE) MG tablet Take 325 mg by mouth daily with breakfast.   Yes Historical Provider, MD  gabapentin (NEURONTIN) 300 MG capsule Take 300 mg by mouth 3 (three) times daily.   Yes Historical Provider, MD  hydrALAZINE (APRESOLINE) 100 MG tablet Take 100 mg by mouth 3 (three) times daily.   Yes Historical Provider, MD  hydrOXYzine (ATARAX/VISTARIL) 25 MG tablet Take 1 tablet by mouth every 6 (six) hours as needed for itching (itching).  09/21/14  Yes Historical Provider, MD  ipratropium-albuterol (DUONEB) 0.5-2.5 (3) MG/3ML SOLN Take 3 mLs by nebulization 3 (three) times daily.   Yes Historical Provider, MD  isosorbide mononitrate (IMDUR) 60 MG 24 hr tablet Take 60 mg by mouth daily.   Yes Historical Provider, MD  labetalol (NORMODYNE) 300 MG tablet Take 300 mg by mouth 2 (two) times daily.   Yes Historical Provider, MD  Melatonin 3 MG TABS Take 3 mg by mouth at bedtime.   Yes Historical Provider, MD  metoprolol tartrate (LOPRESSOR) 25 MG tablet Take 3 tablets (75 mg total) by mouth 2 (two) times daily. 05/14/14  Yes Orson Eva, MD  morphine (MS CONTIN) 30 MG 12 hr tablet Take 30 mg by mouth every 12 (twelve) hours.   Yes Historical Provider, MD  Multiple Vitamin (MULTIVITAMIN WITH MINERALS) TABS tablet Take 1 tablet by mouth daily.   Yes Historical Provider, MD  Oxycodone HCl 10 MG TABS Take 1 tablet (10 mg total) by mouth every 4 (four) hours as needed (moderate pain). 12/23/14  Yes Hosie Poisson, MD  Polyethyl Glycol-Propyl Glycol (SYSTANE) 0.4-0.3 % SOLN Place 1 drop into both eyes 2 (two) times daily.   Yes Historical Provider, MD  psyllium (HYDROCIL/METAMUCIL) 95 % PACK Take 1 packet by mouth daily.   Yes Historical Provider, MD  torsemide (DEMADEX) 20 MG tablet Take 20 mg by mouth daily.   Yes Historical Provider, MD  vitamin C (ASCORBIC ACID) 500 MG tablet Take 500 mg by mouth 2 (two) times daily.   Yes Historical  Provider, MD  levofloxacin (LEVAQUIN) 500 MG tablet Take 1 tablet (500 mg total) by mouth daily. Patient not taking: Reported on 02/03/2016 12/23/14   Hosie Poisson, MD  saccharomyces boulardii (FLORASTOR) 250 MG capsule Take 1 capsule (250 mg total) by mouth 2 (two) times daily. Patient not taking: Reported on 02/22/2016 09/09/14   Nat Math, MD  senna (SENOKOT) 8.6 MG TABS tablet Take 1 tablet (8.6 mg total) by mouth 2 (two) times daily. Patient not taking: Reported on 02/09/2016 04/09/14   Melton Alar, PA-C  zolpidem (AMBIEN) 5 MG tablet Take 1 tablet (5 mg total) by mouth at bedtime as needed for sleep. Patient not taking: Reported on 02/12/2016 12/23/14   Hosie Poisson, MD   Allergies  Allergen Reactions  . Sulfamethoxazole-Trimethoprim Shortness Of Breath and Other (See Comments)    Hyperkalemia (July 2015) Includes all Sulfa Related Antibiotics  . Bactrim [Sulfamethoxazole-Trimethoprim] Other (See Comments)    Hyperkalemia (July 2015)  . Penicillins Hives    Has taken cephalexin 06/2014    FAMILY HISTORY:  family history includes Asthma in her brother; Diabetes in her mother and sister; Emphysema in her father; Heart disease in her maternal grandmother and mother; Stomach cancer in her brother. SOCIAL HISTORY:  reports that she quit smoking about 34 years ago. Her smoking  use included Cigarettes. She has a 33 pack-year smoking history. She has never used smokeless tobacco. She reports that she drinks alcohol. She reports that she does not use illicit drugs.  REVIEW OF SYSTEMS:   Unable   SUBJECTIVE:  Looks comfortable   VITAL SIGNS: Temp:  [98.4 F (36.9 C)-99.6 F (37.6 C)] 99.6 F (37.6 C) (06/18 1058) Pulse Rate:  [74-88] 74 (06/18 1200) Resp:  [6-16] 12 (06/18 1200) BP: (93-129)/(49-68) 115/65 mmHg (06/18 1200) SpO2:  [90 %-100 %] 100 % (06/18 1200) Weight:  [339 lb 11.7 oz (154.1 kg)-351 lb (159.213 kg)] 339 lb 11.7 oz (154.1 kg) (06/18 [id number])  PHYSICAL  EXAMINATION: General:  Obese 70 yr old female, opens eyes briefly but in no distress.  Neuro:  Obtunded moves ext. No focal def  HEENT:  Face mask in place,. Neck is large  Cardiovascular:  rrr no MRG  Lungs:  Minimal movement. Decreased in bases  Abdomen: soft, + bowel sounds Musculoskeletal:  Equal st and bulk  Skin:  Warm and dry    Recent Labs Lab 01/31/2016 0538 02/16/2016 0549  NA 133* 134*  K 5.2* 5.1  CL 101 104  CO2 20*  --   BUN 59* 59*  CREATININE 5.33* 6.10*  GLUCOSE 133* 132*    Recent Labs Lab 02/01/2016 0538 01/31/2016 0549  HGB 9.0* 11.2*  HCT 30.7* 33.0*  WBC 8.9  --   PLT 271  --    Ct Head Wo Contrast  02/24/2016  CLINICAL DATA:  Altered mental status and shortness of breath. EXAM: CT HEAD WITHOUT CONTRAST TECHNIQUE: Contiguous axial images were obtained from the base of the skull through the vertex without intravenous contrast. COMPARISON:  01/28/2016 FINDINGS: Mild cerebral atrophy. Mild ventricular dilatation consistent with central atrophy. Patchy low-attenuation changes suggesting small vessel ischemia. No mass effect or midline shift. No abnormal extra-axial fluid collections. Gray-white matter junctions are distinct. Basal cisterns are not effaced. No evidence of acute intracranial hemorrhage. No depressed skull fractures. Visualized paranasal sinuses and mastoid air cells are not opacified. Vascular calcifications. IMPRESSION: No acute intracranial abnormalities. Mild chronic atrophy and small vessel ischemic changes. Electronically Signed   By: Lucienne Capers M.D.   On: 02/10/2016 06:37   Dg Chest Port 1 View  02/01/2016  CLINICAL DATA:  Altered mental status. EXAM: PORTABLE CHEST 1 VIEW COMPARISON:  02/04/2016 FINDINGS: Shallow inspiration. Cardiac enlargement with pulmonary vascular congestion. Hazy airspace disease throughout both lungs probably representing edema. No focal consolidation. No blunting of costophrenic angles. No pneumothorax. IMPRESSION:  Shallow inspiration. Cardiac enlargement with pulmonary vascular congestion and probable bilateral pulmonary edema. Electronically Signed   By: Lucienne Capers M.D.   On: 02/05/2016 05:37    ASSESSMENT / PLAN:  Acute on chronic hypoxic and hypercarbic Respiratory failure Acute on Chronic Renal failure Acute Hypercarbic Encephalopathy Decompensated diastolic HF Pulmonary edema Cor Pulmonale OHS OSA MO Cardiorenal syndrome  LLE cellulitis  UTI Right thigh abscess (MRSA) Severe deconditioning  FTT  Acute on Chronic Hypoxic and hypercarbic respiratory failure due to decompensated diastolic HF and pulmonary edema in setting OSA/OHS and probably decompensated Cor Pulmonale. This is also further complicated by her acute on chronic renal failure.  ->does not appear to be improving w/ NIPPV ->this is not a problem that we can fix long term.  ->even if she were to improve this will happen again ->not a candidate for HD ->family says she would not want invasive interventions such as long term HD or vent trach  Plan/rec -Think best option here is to transition to comfort.  -Will meet w/ the family this afternoon-->if they agree we may go ahead and dc bipap, make full DNR and just provide supportive care.  -all other issues per primary service   Erick Colace ACNP-BC [place] Pager # [phone] OR # [phone] if no answer   01/31/2016, 3:12 PM  Attending Note:  70 year old female with extensive PMH above who presents to the hospital in respiratory failure due to pulmonary edema and sepsis with LLE cellulitis.  Patient deteriorated and was started on BiPAP.  Patient is severely debilitated and aggressive care would be highly inadvisable given level of debility and organ failure.  She is currently full code but patient's son is coming in and will have a discussion with him regarding plan of care and comfort measures.  Transfer care back to [facility], PCCM sign off since  patient is full comfort care.  The patient is critically ill with multiple organ systems failure and requires high complexity decision making for assessment and support, frequent evaluation and titration of therapies, application of advanced monitoring technologies and extensive interpretation of multiple databases.   Critical Care Time devoted to patient care services described in this note is  35  Minutes. This time reflects time of care of this signee Dr Jennet Maduro. This critical care time does not reflect procedure time, or teaching time or supervisory time of PA/NP/Med student/Med Resident etc but could involve care discussion time.  Rush Farmer, M.D. [facility] Pulmonary/Critical Care Medicine. Pager: [phone]. After hours pager: [phone].

## 2016-02-12 NOTE — ED Provider Notes (Signed)
CSN: [id number]     Arrival date & time 02/14/2016  0451 History   First MD Initiated Contact with Patient 02/06/2016 0459     Chief Complaint  Patient presents with  . Altered Mental Status  . Shortness of Breath     (Consider location/radiation/quality/duration/timing/severity/associated sxs/prior Treatment) Patient is a 70 y.o. female presenting with altered mental status and shortness of breath. The history is provided by the patient.  Altered Mental Status Presenting symptoms: unresponsiveness   Severity:  Moderate Episode history:  Continuous Duration:  2 days Timing:  Constant Progression:  Worsening Chronicity:  New Associated symptoms: no fever, no headaches, no nausea, no palpitations and no vomiting   Shortness of Breath Associated symptoms: no chest pain, no fever, no headaches, no vomiting and no wheezing    70 yo F with a cc of AMS.  Going on for past couple days, worsening this morning.  Patient normally conversant, now with slowed response.  Deny infectious symptoms.  Deny abdominal pain.   Level V caveat AMS.   Past Medical History  Diagnosis Date  . Hypertension   . Hyperlipidemia   . CHF (congestive heart failure) ([place])   . GERD (gastroesophageal reflux disease)   . Depression   . Lymphedema of leg     left leg  . Pulmonary embolism ([place]) 02/18/14    diagnosed at [facility] with CT angio chest  . Chronic indwelling Foley catheter   . Heart murmur     "just found out I have one" (04/07/2014)  . DVT (deep venous thrombosis) ([place]) 01/2014    LLE; "went from my leg to my lungs"  . Pneumonia 1990's X 2  . Sleep apnea     doesn't use cpap machine or 02 at night (04/07/2014)  . Anemia   . History of blood transfusion 1990's X 2    "when they did my surgeries"  . Migraines     "none for years now" (04/07/2014)  . Arthritis     "arms, legs" (04/07/2014)  . Gout   . Urinary incontinence   . Chronic kidney disease (CKD), stage III (moderate)     [name] 04/07/2014  .  Neuropathy ([place])   . Diabetes mellitus     "at one time" (04/07/2014)   Past Surgical History  Procedure Laterality Date  . Shoulder open rotator cuff repair Right ~ 2000  . Knee arthroscopy Right ?1980's  . Temporal artery biopsy / ligation Right ?1990's  . Colonoscopy with propofol  09/23/2012    Procedure: COLONOSCOPY WITH PROPOFOL;  Surgeon: Jerene Bears, MD;  Location: [facility];  Service: Gastroenterology;  Laterality: N/A;  . Panniculectomy Left 02/08/2014    w/wound debridement @ medial thigh  . Irrigation and debridement abscess Left 02/24/2014    Procedure: MINOR INCISION AND DRAINAGE OF ABSCESS;  Surgeon: Theodoro Kos, DO;  Location: [facility];  Service: Plastics;  Laterality: Left;  . I&d extremity Left 03/03/2014    Procedure: IRRIGATION AND DEBRIDEMENT LEFT LEG WOUND AND PLACEMENT OF A CELL AND VAC;  Surgeon: Theodoro Kos, DO;  Location: [place];  Service: Plastics;  Laterality: Left;  . Incision and drainage of wound Left 03/16/2014    Procedure: IRRIGATION AND DEBRIDEMENT OF LEFT THIGH WOUND, APPLICATION ACELL;  Surgeon: Irene Limbo, MD;  Location: [place];  Service: Plastics;  Laterality: Left;  . Incision and drainage of wound Left 03/25/2014    Procedure: IRRIGATION AND DEBRIDEMENT OF LEFT LEG WOUND WITH PLACEMENT OF ACELL/VAC;  Surgeon:  Claire Sanger, DO;  Location: [place];  Service: Clinical cytogeneticist;  Laterality: Left;  . Incision and drainage of wound Left 03/31/2014    Procedure: IRRIGATION AND DEBRIDEMENT LEFT LEG WOUND WITH PLACEMENT OF A-CELL/VAC;  Surgeon: Theodoro Kos, DO;  Location: [place];  Service: Plastics;  Laterality: Left;  . Cataract extraction w/ intraocular lens implant Right 2014  . Vena cava filter placement  02/2014    NOT MRI SAFE  . Incision and drainage of wound Left 04/07/2014    Procedure: IRRIGATION AND DEBRIDEMENT LEFT LEG WOUND WITH PLACEMENT OF A CELL AND VAC;  Surgeon: Theodoro Kos, DO;  Location: [place];  Service: Plastics;  Laterality: Left;  . Incision  and drainage of wound Left 04/14/2014    Procedure: IRRIGATION AND DEBRIDEMENT WOUND;  Surgeon: Theodoro Kos, DO;  Location: [place];  Service: Plastics;  Laterality: Left;  . Application of a-cell of extremity Left 04/14/2014    Procedure: APPLICATION OF A-CELL OF EXTREMITY;  Surgeon: Theodoro Kos, DO;  Location: [place];  Service: Plastics;  Laterality: Left;  Marland Kitchen Minor application of wound vac Left 04/14/2014    Procedure: MINOR APPLICATION OF WOUND VAC;  Surgeon: Theodoro Kos, DO;  Location: [place];  Service: Plastics;  Laterality: Left;  . Incision and drainage of wound Left 04/21/2014    Procedure: IRRIGATION AND DEBRIDEMENT OF LEFT UPPER LEG WOUND WITH PLACEMENT OF ACELL/VAC;  Surgeon: Theodoro Kos, DO;  Location: [place];  Service: Plastics;  Laterality: Left;  . Knee arthroscopy Left 12/17/2014    Procedure: Synovectomy with medial menisectomy and partial lateral menisectomy chrondroplasty all three compartments and loose body removal;  Surgeon: Altamese [place], MD;  Location: [place];  Service: Orthopedics;  Laterality: Left;  . Irrigation and debridement knee Left 12/17/2014    Procedure: IRRIGATION AND DEBRIDEMENT of left KNEE;  Surgeon: Altamese [place], MD;  Location: [place];  Service: Orthopedics;  Laterality: Left;  . Knee arthroscopy Left 12/20/2014    Procedure: LEFT KNEE ARTHROSCOPY;  Surgeon: Altamese [place], MD;  Location: [place];  Service: Orthopedics;  Laterality: Left;   Family History  Problem Relation Age of Onset  . Emphysema Father   . Asthma Brother   . Heart disease Mother   . Stomach cancer Brother   . Heart disease Maternal Grandmother   . Diabetes Mother   . Diabetes Sister    Social History  Substance Use Topics  . Smoking status: Former Smoker -- 1.00 packs/day for 33 years    Types: Cigarettes    Quit date: 08/27/1981  . Smokeless tobacco: Never Used  . Alcohol Use: Yes     Comment: 04/07/2014 "used to drink; stopped in ~ 1983"   OB History    No data available      Review of Systems  Unable to perform ROS: Mental status change  Constitutional: Positive for activity change and fatigue. Negative for fever and chills.  HENT: Negative for congestion and rhinorrhea.   Eyes: Negative for redness and visual disturbance.  Respiratory: Negative for shortness of breath and wheezing.   Cardiovascular: Negative for chest pain and palpitations.  Gastrointestinal: Negative for nausea and vomiting.  Genitourinary: Negative for dysuria and urgency.  Musculoskeletal: Negative for myalgias and arthralgias.  Skin: Negative for pallor and wound.  Neurological: Negative for dizziness and headaches.      Allergies  Sulfamethoxazole-trimethoprim; Bactrim; and Penicillins  Home Medications   Prior to Admission medications   Medication Sig Start Date End Date Taking? Authorizing Provider  acetaminophen (TYLENOL) 325 MG tablet Take 650 mg by mouth every 4 (four) hours as needed for mild pain.   Yes Historical Provider, MD  Amino Acids-Protein Hydrolys (FEEDING SUPPLEMENT, PRO-STAT SUGAR FREE 64,) LIQD Take 30 mLs by mouth 2 (two) times daily.   Yes Historical Provider, MD  amLODipine (NORVASC) 10 MG tablet Take 1 tablet (10 mg total) by mouth daily. 11/26/14  Yes Christina P Rama, MD  bisacodyl (DULCOLAX) 10 MG suppository Place 10 mg rectally as needed for moderate constipation.   Yes Historical Provider, MD  calcitRIOL (ROCALTROL) 0.25 MCG capsule Take 0.25 mcg by mouth daily.   Yes Historical Provider, MD  cloNIDine (CATAPRES) 0.2 MG tablet Take 0.2 mg by mouth 2 (two) times daily.   Yes Historical Provider, MD  colchicine 0.6 MG tablet Take 0.6 mg by mouth daily.  11/09/14  Yes Historical Provider, MD  dicyclomine (BENTYL) 10 MG capsule Take 10 mg by mouth 2 (two) times daily.   Yes Historical Provider, MD  doxazosin (CARDURA) 2 MG tablet Take 2 mg by mouth daily.   Yes Historical Provider, MD  epoetin alfa (EPOGEN,PROCRIT) 91478 UNIT/ML injection Inject 10,000  Units into the vein 3 (three) times a week.   Yes Historical Provider, MD  ferrous sulfate 325 (65 FE) MG tablet Take 325 mg by mouth daily with breakfast.   Yes Historical Provider, MD  gabapentin (NEURONTIN) 300 MG capsule Take 300 mg by mouth 3 (three) times daily.   Yes Historical Provider, MD  hydrALAZINE (APRESOLINE) 100 MG tablet Take 100 mg by mouth 3 (three) times daily.   Yes Historical Provider, MD  hydrOXYzine (ATARAX/VISTARIL) 25 MG tablet Take 1 tablet by mouth every 6 (six) hours as needed for itching (itching).  09/21/14  Yes Historical Provider, MD  ipratropium-albuterol (DUONEB) 0.5-2.5 (3) MG/3ML SOLN Take 3 mLs by nebulization 3 (three) times daily.   Yes Historical Provider, MD  isosorbide mononitrate (IMDUR) 60 MG 24 hr tablet Take 60 mg by mouth daily.   Yes Historical Provider, MD  labetalol (NORMODYNE) 300 MG tablet Take 300 mg by mouth 2 (two) times daily.   Yes Historical Provider, MD  Melatonin 3 MG TABS Take 3 mg by mouth at bedtime.   Yes Historical Provider, MD  metoprolol tartrate (LOPRESSOR) 25 MG tablet Take 3 tablets (75 mg total) by mouth 2 (two) times daily. 05/14/14  Yes Orson Eva, MD  morphine (MS CONTIN) 30 MG 12 hr tablet Take 30 mg by mouth every 12 (twelve) hours.   Yes Historical Provider, MD  Multiple Vitamin (MULTIVITAMIN WITH MINERALS) TABS tablet Take 1 tablet by mouth daily.   Yes Historical Provider, MD  Oxycodone HCl 10 MG TABS Take 1 tablet (10 mg total) by mouth every 4 (four) hours as needed (moderate pain). 12/23/14  Yes Hosie Poisson, MD  Polyethyl Glycol-Propyl Glycol (SYSTANE) 0.4-0.3 % SOLN Place 1 drop into both eyes 2 (two) times daily.   Yes Historical Provider, MD  psyllium (HYDROCIL/METAMUCIL) 95 % PACK Take 1 packet by mouth daily.   Yes Historical Provider, MD  torsemide (DEMADEX) 20 MG tablet Take 20 mg by mouth daily.   Yes Historical Provider, MD  vitamin C (ASCORBIC ACID) 500 MG tablet Take 500 mg by mouth 2 (two) times daily.   Yes  Historical Provider, MD  levofloxacin (LEVAQUIN) 500 MG tablet Take 1 tablet (500 mg total) by mouth daily. Patient not taking: Reported on 02/07/2016 12/23/14   Hosie Poisson, MD  saccharomyces boulardii [facility])  250 MG capsule Take 1 capsule (250 mg total) by mouth 2 (two) times daily. Patient not taking: Reported on 02/03/2016 09/09/14   Nat Math, MD  senna (SENOKOT) 8.6 MG TABS tablet Take 1 tablet (8.6 mg total) by mouth 2 (two) times daily. Patient not taking: Reported on 02/23/2016 04/09/14   Melton Alar, PA-C  zolpidem (AMBIEN) 5 MG tablet Take 1 tablet (5 mg total) by mouth at bedtime as needed for sleep. Patient not taking: Reported on 02/07/2016 12/23/14   Hosie Poisson, MD   BP 102/54 mmHg  Pulse 88  Temp(Src) 98.6 F (37 C) (Rectal)  Resp 10  Ht 5\' 6"  (1.676 m)  Wt 351 lb (159.213 kg)  BMI 56.68 kg/m2  SpO2 97% Physical Exam  Constitutional: She appears well-developed and well-nourished. No distress.  Chronically ill appearing   HENT:  Head: Normocephalic and atraumatic.  Eyes: EOM are normal. Pupils are equal, round, and reactive to light.  Neck: Normal range of motion. Neck supple.  Cardiovascular: Normal rate and regular rhythm.  Exam reveals no gallop and no friction rub.   No murmur heard. Pulmonary/Chest: Effort normal. She has no wheezes. She has no rales.  Abdominal: Soft. She exhibits no distension. There is no tenderness. There is no rebound and no guarding.  Musculoskeletal: She exhibits no edema or tenderness.  Neurological: She is alert.  Skin: Skin is warm and dry. She is not diaphoretic.  Psychiatric: She has a normal mood and affect. Her behavior is normal.  Nursing note and vitals reviewed.   ED Course  Procedures (including critical care time) Labs Review Labs Reviewed  CBC WITH DIFFERENTIAL/PLATELET - Abnormal; Notable for the following:    RBC 3.35 (*)    Hemoglobin 9.0 (*)    HCT 30.7 (*)    MCHC 29.3 (*)    All other components  within normal limits  URINALYSIS, ROUTINE W REFLEX MICROSCOPIC (NOT AT [facility]) - Abnormal; Notable for the following:    APPearance CLOUDY (*)    Hgb urine dipstick LARGE (*)    Bilirubin Urine SMALL (*)    Protein, ur >300 (*)    Nitrite POSITIVE (*)    Leukocytes, UA MODERATE (*)    All other components within normal limits  URINE MICROSCOPIC-ADD ON - Abnormal; Notable for the following:    Squamous Epithelial / LPF 6-30 (*)    Bacteria, UA MANY (*)    All other components within normal limits  I-STAT CHEM 8, ED - Abnormal; Notable for the following:    Sodium 134 (*)    BUN 59 (*)    Creatinine, Ser 6.10 (*)    Glucose, Bld 132 (*)    Calcium, Ion 1.03 (*)    Hemoglobin 11.2 (*)    HCT 33.0 (*)    All other components within normal limits  CBG MONITORING, ED - Abnormal; Notable for the following:    Glucose-Capillary 130 (*)    All other components within normal limits  URINE CULTURE  CULTURE, BLOOD (ROUTINE X 2)  CULTURE, BLOOD (ROUTINE X 2)  AMMONIA  COMPREHENSIVE METABOLIC PANEL  ETHANOL  I-STAT CG4 LACTIC ACID, ED    Imaging Review Dg Chest Port 1 View  02/04/2016  CLINICAL DATA:  Altered mental status. EXAM: PORTABLE CHEST 1 VIEW COMPARISON:  02/04/2016 FINDINGS: Shallow inspiration. Cardiac enlargement with pulmonary vascular congestion. Hazy airspace disease throughout both lungs probably representing edema. No focal consolidation. No blunting of costophrenic angles. No pneumothorax. IMPRESSION: Shallow inspiration.  Cardiac enlargement with pulmonary vascular congestion and probable bilateral pulmonary edema. Electronically Signed   By: Lucienne Capers M.D.   On: 02/19/2016 05:37   I have personally reviewed and evaluated these images and lab results as part of my medical decision-making.   EKG Interpretation None      MDM   Final diagnoses:  UTI (lower urinary tract infection)  Altered mental status, unspecified altered mental status type    70 yo F with  AMS, concern for infection will give fluids, check blood cultures, ua, cxr. Patient hypoxic on arrival no history of oxygen requirement.  Patient appears to be in acute renal failure. A urinary tract infection as well. Chest x-ray with poor inspiratory effort. Cover broadly with antibiotics. Admit.   The patients results and plan were reviewed and discussed.   Any x-rays performed were independently reviewed by myself.   Differential diagnosis were considered with the presenting HPI.  Medications  vancomycin (VANCOCIN) 2,000 mg in sodium chloride 0.9 % 500 mL IVPB (not administered)  sodium chloride 0.9 % bolus 1,000 mL (1,000 mLs Intravenous New Bag/Given 02/04/2016 0553)  piperacillin-tazobactam (ZOSYN) IVPB 3.375 g (3.375 g Intravenous New Bag/Given 02/01/2016 0554)    Filed Vitals:   02/17/2016 0454 02/18/2016 0457 02/10/2016 0503 02/20/2016 0600  BP: 102/54     Pulse: 88     Temp: 98.8 F (37.1 C)   98.6 F (37 C)  TempSrc: Oral   Rectal  Resp: 10     Height:   5\' 6"  (1.676 m)   Weight:   351 lb (159.213 kg)   SpO2: 95% 95% 97%     Final diagnoses:  UTI (lower urinary tract infection)  Altered mental status, unspecified altered mental status type    Admission/ observation were discussed with the admitting physician, patient and/or family and they are comfortable with the plan.    Deno Etienne, DO 02/07/2016 [phone]

## 2016-02-12 NOTE — Progress Notes (Signed)
Per family request, bipap was removed.  Pt sat 95% on room air, pt does not appear uncomfortable currently, no distress noted.  RN and family at bedside.

## 2016-02-12 NOTE — Consult Note (Signed)
Renal Service Consult Note [facility]  ZELTA SEDBERRY 02/08/2016 Roney Jaffe D Requesting Physician:  Dr. Nelda Marseille  Reason for Consult:  Acute on CRF HPI: The patient is a 70 y.o. year-old with history of morbid obesity, HTN, PE/ IVC filter, anemia, gout and CKD baseline creat 1.5- 2.5.  Looking at [place] and [facility] records >   Chart review: Jul 15 - hyperkalemia, CKD, chron L leg wd, DM, hx PE/ IVC filter, anemia, MO in for I&D left leg wd and K was high.  Rx'd and dc'd home.  Aug 15 - L leg pain, xray neg, duplex neg , dc'd back to SNF Sept 15 - LLE cellulitis w sepsis and +bcx for streptococcus, grp C Nov 15 - 2nd episode LLE cellulitis w severe sepsis, bcx grew strep C again, repeat bcx's neg Jan 16 - Grp C strep bacteremia / L leg open wound w cellulitis, CKD 3 Mar 16 - chest pain , r/o for MI, no PE Apr 16 - septic L knee cx+ for grp C streptococcus, rx I&D, abx, SNF Oct 16 - L eye cataract surgery OCt 16 - admit decomp CHF rx w IV lasix, debility dc to SNF Mar 17 - lap chole in [facility] hosp? May-Jun 17 > R thigh abscess I&D grew MRSA, don't have a lot of details from care Everywhere.  Family member says patient was in "a coma " for 5 days then improved, then again for "2 days" then improved again.  She had 1 HD session but then kidneys got better and she was dc'd to SNF.    Pt unable to provide history.  Sent for [facility] SNF this am , was dc'd from [place] on 6/14 to [facility].   Sent to ED for AMS.  RA 85%.  CXR likely pulm edema, hazy subtle infiltrates.  Creat 5.7 on admission.  Started on bipap and IV abx.  UA dirty.  K 5.1. Asked to see for acute on CRF.    ROS  N/A   Past Medical History  Past Medical History  Diagnosis Date  . Hypertension   . Hyperlipidemia   . CHF (congestive heart failure) ([place])   . GERD (gastroesophageal reflux disease)   . Depression   . Lymphedema of leg     left leg  . Pulmonary embolism ([place]) 02/18/14    diagnosed at  [facility] with CT angio chest  . Chronic indwelling Foley catheter   . Heart murmur     "just found out I have one" (04/07/2014)  . DVT (deep venous thrombosis) ([place]) 01/2014    LLE; "went from my leg to my lungs"  . Pneumonia 1990's X 2  . Sleep apnea     doesn't use cpap machine or 02 at night (04/07/2014)  . Anemia   . History of blood transfusion 1990's X 2    "when they did my surgeries"  . Migraines     "none for years now" (04/07/2014)  . Arthritis     "arms, legs" (04/07/2014)  . Gout   . Urinary incontinence   . Chronic kidney disease (CKD), stage III (moderate)     [name] 04/07/2014  . Neuropathy ([place])   . Diabetes mellitus     "at one time" (04/07/2014)   Past Surgical History  Past Surgical History  Procedure Laterality Date  . Shoulder open rotator cuff repair Right ~ 2000  . Knee arthroscopy Right ?1980's  . Temporal artery biopsy / ligation Right ?1990's  .  Colonoscopy with propofol  09/23/2012    Procedure: COLONOSCOPY WITH PROPOFOL;  Surgeon: Jerene Bears, MD;  Location: [facility];  Service: Gastroenterology;  Laterality: N/A;  . Panniculectomy Left 02/08/2014    w/wound debridement @ medial thigh  . Irrigation and debridement abscess Left 02/24/2014    Procedure: MINOR INCISION AND DRAINAGE OF ABSCESS;  Surgeon: Theodoro Kos, DO;  Location: [facility];  Service: Plastics;  Laterality: Left;  . I&d extremity Left 03/03/2014    Procedure: IRRIGATION AND DEBRIDEMENT LEFT LEG WOUND AND PLACEMENT OF A CELL AND VAC;  Surgeon: Theodoro Kos, DO;  Location: [place];  Service: Plastics;  Laterality: Left;  . Incision and drainage of wound Left 03/16/2014    Procedure: IRRIGATION AND DEBRIDEMENT OF LEFT THIGH WOUND, APPLICATION ACELL;  Surgeon: Irene Limbo, MD;  Location: [place];  Service: Plastics;  Laterality: Left;  . Incision and drainage of wound Left 03/25/2014    Procedure: IRRIGATION AND DEBRIDEMENT OF LEFT LEG WOUND WITH PLACEMENT OF ACELL/VAC;  Surgeon: Theodoro Kos, DO;   Location: [place];  Service: Plastics;  Laterality: Left;  . Incision and drainage of wound Left 03/31/2014    Procedure: IRRIGATION AND DEBRIDEMENT LEFT LEG WOUND WITH PLACEMENT OF A-CELL/VAC;  Surgeon: Theodoro Kos, DO;  Location: [place];  Service: Plastics;  Laterality: Left;  . Cataract extraction w/ intraocular lens implant Right 2014  . Vena cava filter placement  02/2014    NOT MRI SAFE  . Incision and drainage of wound Left 04/07/2014    Procedure: IRRIGATION AND DEBRIDEMENT LEFT LEG WOUND WITH PLACEMENT OF A CELL AND VAC;  Surgeon: Theodoro Kos, DO;  Location: [place];  Service: Plastics;  Laterality: Left;  . Incision and drainage of wound Left 04/14/2014    Procedure: IRRIGATION AND DEBRIDEMENT WOUND;  Surgeon: Theodoro Kos, DO;  Location: [place];  Service: Plastics;  Laterality: Left;  . Application of a-cell of extremity Left 04/14/2014    Procedure: APPLICATION OF A-CELL OF EXTREMITY;  Surgeon: Theodoro Kos, DO;  Location: [place];  Service: Plastics;  Laterality: Left;  Marland Kitchen Minor application of wound vac Left 04/14/2014    Procedure: MINOR APPLICATION OF WOUND VAC;  Surgeon: Theodoro Kos, DO;  Location: [place];  Service: Plastics;  Laterality: Left;  . Incision and drainage of wound Left 04/21/2014    Procedure: IRRIGATION AND DEBRIDEMENT OF LEFT UPPER LEG WOUND WITH PLACEMENT OF ACELL/VAC;  Surgeon: Theodoro Kos, DO;  Location: [place];  Service: Plastics;  Laterality: Left;  . Knee arthroscopy Left 12/17/2014    Procedure: Synovectomy with medial menisectomy and partial lateral menisectomy chrondroplasty all three compartments and loose body removal;  Surgeon: Altamese Woodburn, MD;  Location: [place];  Service: Orthopedics;  Laterality: Left;  . Irrigation and debridement knee Left 12/17/2014    Procedure: IRRIGATION AND DEBRIDEMENT of left KNEE;  Surgeon: Altamese Kell, MD;  Location: [place];  Service: Orthopedics;  Laterality: Left;  . Knee arthroscopy Left 12/20/2014    Procedure: LEFT KNEE  ARTHROSCOPY;  Surgeon: Altamese Peoria, MD;  Location: [place];  Service: Orthopedics;  Laterality: Left;   Family History  Family History  Problem Relation Age of Onset  . Emphysema Father   . Asthma Brother   . Heart disease Mother   . Stomach cancer Brother   . Heart disease Maternal Grandmother   . Diabetes Mother   . Diabetes Sister    Social History  reports that she quit smoking about 34 years ago. Her  smoking use included Cigarettes. She has a 33 pack-year smoking history. She has never used smokeless tobacco. She reports that she drinks alcohol. She reports that she does not use illicit drugs. Allergies  Allergies  Allergen Reactions  . Sulfamethoxazole-Trimethoprim Shortness Of Breath and Other (See Comments)    Hyperkalemia (July 2015) Includes all Sulfa Related Antibiotics  . Bactrim [Sulfamethoxazole-Trimethoprim] Other (See Comments)    Hyperkalemia (July 2015)  . Penicillins Hives    Has taken cephalexin 06/2014   Home medications Prior to Admission medications   Medication Sig Start Date End Date Taking? Authorizing Provider  acetaminophen (TYLENOL) 325 MG tablet Take 650 mg by mouth every 4 (four) hours as needed for mild pain.   Yes Historical Provider, MD  Amino Acids-Protein Hydrolys (FEEDING SUPPLEMENT, PRO-STAT SUGAR FREE 64,) LIQD Take 30 mLs by mouth 2 (two) times daily.   Yes Historical Provider, MD  amLODipine (NORVASC) 10 MG tablet Take 1 tablet (10 mg total) by mouth daily. 11/26/14  Yes Christina P Rama, MD  bisacodyl (DULCOLAX) 10 MG suppository Place 10 mg rectally as needed for moderate constipation.   Yes Historical Provider, MD  calcitRIOL (ROCALTROL) 0.25 MCG capsule Take 0.25 mcg by mouth daily.   Yes Historical Provider, MD  cloNIDine (CATAPRES) 0.2 MG tablet Take 0.2 mg by mouth 2 (two) times daily.   Yes Historical Provider, MD  colchicine 0.6 MG tablet Take 0.6 mg by mouth daily.  11/09/14  Yes Historical Provider, MD  dicyclomine (BENTYL) 10 MG  capsule Take 10 mg by mouth 2 (two) times daily.   Yes Historical Provider, MD  doxazosin (CARDURA) 2 MG tablet Take 2 mg by mouth daily.   Yes Historical Provider, MD  epoetin alfa (EPOGEN,PROCRIT) 09811 UNIT/ML injection Inject 10,000 Units into the vein 3 (three) times a week.   Yes Historical Provider, MD  ferrous sulfate 325 (65 FE) MG tablet Take 325 mg by mouth daily with breakfast.   Yes Historical Provider, MD  gabapentin (NEURONTIN) 300 MG capsule Take 300 mg by mouth 3 (three) times daily.   Yes Historical Provider, MD  hydrALAZINE (APRESOLINE) 100 MG tablet Take 100 mg by mouth 3 (three) times daily.   Yes Historical Provider, MD  hydrOXYzine (ATARAX/VISTARIL) 25 MG tablet Take 1 tablet by mouth every 6 (six) hours as needed for itching (itching).  09/21/14  Yes Historical Provider, MD  ipratropium-albuterol (DUONEB) 0.5-2.5 (3) MG/3ML SOLN Take 3 mLs by nebulization 3 (three) times daily.   Yes Historical Provider, MD  isosorbide mononitrate (IMDUR) 60 MG 24 hr tablet Take 60 mg by mouth daily.   Yes Historical Provider, MD  labetalol (NORMODYNE) 300 MG tablet Take 300 mg by mouth 2 (two) times daily.   Yes Historical Provider, MD  Melatonin 3 MG TABS Take 3 mg by mouth at bedtime.   Yes Historical Provider, MD  metoprolol tartrate (LOPRESSOR) 25 MG tablet Take 3 tablets (75 mg total) by mouth 2 (two) times daily. 05/14/14  Yes Orson Eva, MD  morphine (MS CONTIN) 30 MG 12 hr tablet Take 30 mg by mouth every 12 (twelve) hours.   Yes Historical Provider, MD  Multiple Vitamin (MULTIVITAMIN WITH MINERALS) TABS tablet Take 1 tablet by mouth daily.   Yes Historical Provider, MD  Oxycodone HCl 10 MG TABS Take 1 tablet (10 mg total) by mouth every 4 (four) hours as needed (moderate pain). 12/23/14  Yes Hosie Poisson, MD  Polyethyl Glycol-Propyl Glycol (SYSTANE) 0.4-0.3 % SOLN Place 1 drop into  both eyes 2 (two) times daily.   Yes Historical Provider, MD  psyllium (HYDROCIL/METAMUCIL) 95 % PACK Take  1 packet by mouth daily.   Yes Historical Provider, MD  torsemide (DEMADEX) 20 MG tablet Take 20 mg by mouth daily.   Yes Historical Provider, MD  vitamin C (ASCORBIC ACID) 500 MG tablet Take 500 mg by mouth 2 (two) times daily.   Yes Historical Provider, MD  levofloxacin (LEVAQUIN) 500 MG tablet Take 1 tablet (500 mg total) by mouth daily. Patient not taking: Reported on 02/11/2016 12/23/14   Hosie Poisson, MD  saccharomyces boulardii (FLORASTOR) 250 MG capsule Take 1 capsule (250 mg total) by mouth 2 (two) times daily. Patient not taking: Reported on 01/28/2016 09/09/14   Nat Math, MD  senna (SENOKOT) 8.6 MG TABS tablet Take 1 tablet (8.6 mg total) by mouth 2 (two) times daily. Patient not taking: Reported on 02/05/2016 04/09/14   Melton Alar, PA-C  zolpidem (AMBIEN) 5 MG tablet Take 1 tablet (5 mg total) by mouth at bedtime as needed for sleep. Patient not taking: Reported on 02/21/2016 12/23/14   Hosie Poisson, MD   Liver Function Tests  Recent Labs Lab 01/26/2016 0538  AST 19  ALT 22  ALKPHOS 83  BILITOT 0.6  PROT 7.8  ALBUMIN 2.7*   No results for input(s): LIPASE, AMYLASE in the last 168 hours. CBC  Recent Labs Lab 02/06/2016 0538 02/16/2016 0549  WBC 8.9  --   NEUTROABS 6.5  --   HGB 9.0* 11.2*  HCT 30.7* 33.0*  MCV 91.6  --   PLT 271  --    Basic Metabolic Panel  Recent Labs Lab 02/09/2016 0538 02/05/2016 0549  NA 133* 134*  K 5.2* 5.1  CL 101 104  CO2 20*  --   GLUCOSE 133* 132*  BUN 59* 59*  CREATININE 5.33* 6.10*  CALCIUM 8.4*  --    Iron/TIBC/Ferritin/ %Sat    Component Value Date/Time   IRON 37 12/29/2015 1349   TIBC 273 12/29/2015 1349   FERRITIN 820* 12/29/2015 1349   IRONPCTSAT 14 12/29/2015 1349    Filed Vitals:   02/21/2016 0822 02/04/2016 1058 01/31/2016 1100 02/09/2016 1200  BP: 94/53  102/58 115/65  Pulse: 80  81 74  Temp: 98.4 F (36.9 C) 99.6 F (37.6 C)    TempSrc: Oral Oral    Resp: 16  16 12   Height: 5\' 6"  (1.676 m)     Weight: 154.1 kg  (339 lb 11.7 oz)     SpO2: 98%  100% 100%   Exam Gen is quite lethargic but follows simple commands, keeps eyes shtut No rash, cyanosis or gangrene Sclera anicteric, Bipap mask in place  No jvd or bruits Chest dec'd bilat bases RRR no MRG Abd morbidly obese, large pannus, 1-2+ abd wall edema depend areas GU foley in place MS no joint effusions or deformity  Ext 1-2+ bilat LE edema / no open wounds or ulcers, R thigh healed scars post Neuro as above on bipap  ABG 7.08/ 69/ 108 Na 134 K 5.1 CO2 20  Cr 6.10  BUN 59  Glu 132   Alb 2.7   LFT's ok NH3 32  BNP 253 CXR prob CHF WBC 8k   Hb 9  plt 271 UA tntc WBC, cloudy, ++ bact, >300 prot   Assessment: 1  Acute on CKD 3/4 - baseline creat 1.5- 2.5, recurrent issue as was just dc'd from outside facility where she required HD for A/CRF.  She is not a long-term dialysis candidate due to severe comorbidities, have d/w POA Mr. Domingo Cocking and with CCM.  Dialysis will not help in the long run,do not recommend dialysis.  Lasix could be tried for poss cardiorenal syndrome, however, it's just as likely that this will not be helpful.  Renal function is very poor.  I think that comfort care would be another approach that should seriously be considered.   2  Resp failure - on bipap 3  Morbid obesity/ CO2 retention 4  Hx recurrent LLE cellulitis and L knee infection due to grp C strep 5  Recent MRSA R thigh abscess 6  Debility - SNF dependent > 1 yr     Plan - as above  Kelly Splinter MD [employer] pager [phone]    cell [phone] 02/10/2016, 1:25 PM

## 2016-02-12 NOTE — H&P (Signed)
History and Physical    KAMDYNN FERNELIUS [id number] DOB: 1946-02-16 DOA: 01/29/2016  PCP: Alvester Chou, NP  Patient coming from:  Nursing home  Chief Complaint: Altered mental status  HPI: Kelly [name] is a 70 y.o. female with multiple medical problems not limited to morbid obesity, hypertension, diabetes, chronic heart failure, PE/DVT, chronic kidney disease. Per EDP notes, patient was apparently recently hospitalized at [facility] where she almost needed emergent dialysis. Patient transferred here from NH this am for altered mental status and hypoxia with O2 saturation of 85% on room air. No history can be obtained from the patient she is nearly obtunded. No acute findings on head CT scan. Patient has had a significant decline in her renal function with creatinine up to 6 now  ED Course:  Afebrile, hemodynamically stable, Zosyn, vancomycin, liter of fluid  Review of Systems: Unable to be obtained from patient  Past Medical History  Diagnosis Date  . Hypertension   . Hyperlipidemia   . CHF (congestive heart failure) ([place])   . GERD (gastroesophageal reflux disease)   . Depression   . Lymphedema of leg     left leg  . Pulmonary embolism ([place]) 02/18/14    diagnosed at [facility] with CT angio chest  . Chronic indwelling Foley catheter   . Heart murmur     "just found out I have one" (04/07/2014)  . DVT (deep venous thrombosis) ([place]) 01/2014    LLE; "went from my leg to my lungs"  . Pneumonia 1990's X 2  . Sleep apnea     doesn't use cpap machine or 02 at night (04/07/2014)  . Anemia   . History of blood transfusion 1990's X 2    "when they did my surgeries"  . Migraines     "none for years now" (04/07/2014)  . Arthritis     "arms, legs" (04/07/2014)  . Gout   . Urinary incontinence   . Chronic kidney disease (CKD), stage III (moderate)     [name] 04/07/2014  . Neuropathy ([place])   . Diabetes mellitus     "at one time" (04/07/2014)    Past Surgical History  Procedure  Laterality Date  . Shoulder open rotator cuff repair Right ~ 2000  . Knee arthroscopy Right ?1980's  . Temporal artery biopsy / ligation Right ?1990's  . Colonoscopy with propofol  09/23/2012    Procedure: COLONOSCOPY WITH PROPOFOL;  Surgeon: Jerene Bears, MD;  Location: [facility];  Service: Gastroenterology;  Laterality: N/A;  . Panniculectomy Left 02/08/2014    w/wound debridement @ medial thigh  . Irrigation and debridement abscess Left 02/24/2014    Procedure: MINOR INCISION AND DRAINAGE OF ABSCESS;  Surgeon: Theodoro Kos, DO;  Location: [facility];  Service: Plastics;  Laterality: Left;  . I&d extremity Left 03/03/2014    Procedure: IRRIGATION AND DEBRIDEMENT LEFT LEG WOUND AND PLACEMENT OF A CELL AND VAC;  Surgeon: Theodoro Kos, DO;  Location: [place];  Service: Plastics;  Laterality: Left;  . Incision and drainage of wound Left 03/16/2014    Procedure: IRRIGATION AND DEBRIDEMENT OF LEFT THIGH WOUND, APPLICATION ACELL;  Surgeon: Irene Limbo, MD;  Location: [place];  Service: Plastics;  Laterality: Left;  . Incision and drainage of wound Left 03/25/2014    Procedure: IRRIGATION AND DEBRIDEMENT OF LEFT LEG WOUND WITH PLACEMENT OF ACELL/VAC;  Surgeon: Theodoro Kos, DO;  Location: [place];  Service: Plastics;  Laterality: Left;  . Incision and drainage of wound Left 03/31/2014  Procedure: IRRIGATION AND DEBRIDEMENT LEFT LEG WOUND WITH PLACEMENT OF A-CELL/VAC;  Surgeon: Theodoro Kos, DO;  Location: [place];  Service: Plastics;  Laterality: Left;  . Cataract extraction w/ intraocular lens implant Right 2014  . Vena cava filter placement  02/2014    NOT MRI SAFE  . Incision and drainage of wound Left 04/07/2014    Procedure: IRRIGATION AND DEBRIDEMENT LEFT LEG WOUND WITH PLACEMENT OF A CELL AND VAC;  Surgeon: Theodoro Kos, DO;  Location: [place];  Service: Plastics;  Laterality: Left;  . Incision and drainage of wound Left 04/14/2014    Procedure: IRRIGATION AND DEBRIDEMENT WOUND;  Surgeon: Theodoro Kos,  DO;  Location: [place];  Service: Plastics;  Laterality: Left;  . Application of a-cell of extremity Left 04/14/2014    Procedure: APPLICATION OF A-CELL OF EXTREMITY;  Surgeon: Theodoro Kos, DO;  Location: [place];  Service: Plastics;  Laterality: Left;  Marland Kitchen Minor application of wound vac Left 04/14/2014    Procedure: MINOR APPLICATION OF WOUND VAC;  Surgeon: Theodoro Kos, DO;  Location: [place];  Service: Plastics;  Laterality: Left;  . Incision and drainage of wound Left 04/21/2014    Procedure: IRRIGATION AND DEBRIDEMENT OF LEFT UPPER LEG WOUND WITH PLACEMENT OF ACELL/VAC;  Surgeon: Theodoro Kos, DO;  Location: [place];  Service: Plastics;  Laterality: Left;  . Knee arthroscopy Left 12/17/2014    Procedure: Synovectomy with medial menisectomy and partial lateral menisectomy chrondroplasty all three compartments and loose body removal;  Surgeon: Altamese [place], MD;  Location: [place];  Service: Orthopedics;  Laterality: Left;  . Irrigation and debridement knee Left 12/17/2014    Procedure: IRRIGATION AND DEBRIDEMENT of left KNEE;  Surgeon: Altamese [place], MD;  Location: [place];  Service: Orthopedics;  Laterality: Left;  . Knee arthroscopy Left 12/20/2014    Procedure: LEFT KNEE ARTHROSCOPY;  Surgeon: Altamese [place], MD;  Location: [place];  Service: Orthopedics;  Laterality: Left;    Social History   Social History  . Marital Status: Legally Separated    Spouse Name: N/A  . Number of Children: 0  . Years of Education: N/A   Occupational History  .     Social History Main Topics  . Smoking status: Former Smoker -- 1.00 packs/day for 33 years    Types: Cigarettes    Quit date: 08/27/1981  . Smokeless tobacco: Never Used  . Alcohol Use: Yes     Comment: 04/07/2014 "used to drink; stopped in ~ 1983"  . Drug Use: No  . Sexual Activity: No   Other Topics Concern  . Not on file   Social History Narrative   Rolling walker.      Allergies  Allergen Reactions  . Sulfamethoxazole-Trimethoprim Shortness  Of Breath and Other (See Comments)    Hyperkalemia (July 2015) Includes all Sulfa Related Antibiotics  . Bactrim [Sulfamethoxazole-Trimethoprim] Other (See Comments)    Hyperkalemia (July 2015)  . Penicillins Hives    Has taken cephalexin 06/2014  Ambulatory status  known  Family History  Problem Relation Age of Onset  . Emphysema Father   . Asthma Brother   . Heart disease Mother   . Stomach cancer Brother   . Heart disease Maternal Grandmother   . Diabetes Mother   . Diabetes Sister      Prior to Admission medications   Medication Sig Start Date End Date Taking? Authorizing Provider  acetaminophen (TYLENOL) 325 MG tablet Take 650 mg by mouth every 4 (four) hours as needed for  mild pain.   Yes Historical Provider, MD  Amino Acids-Protein Hydrolys (FEEDING SUPPLEMENT, PRO-STAT SUGAR FREE 64,) LIQD Take 30 mLs by mouth 2 (two) times daily.   Yes Historical Provider, MD  amLODipine (NORVASC) 10 MG tablet Take 1 tablet (10 mg total) by mouth daily. 11/26/14  Yes Christina P Rama, MD  bisacodyl (DULCOLAX) 10 MG suppository Place 10 mg rectally as needed for moderate constipation.   Yes Historical Provider, MD  calcitRIOL (ROCALTROL) 0.25 MCG capsule Take 0.25 mcg by mouth daily.   Yes Historical Provider, MD  cloNIDine (CATAPRES) 0.2 MG tablet Take 0.2 mg by mouth 2 (two) times daily.   Yes Historical Provider, MD  colchicine 0.6 MG tablet Take 0.6 mg by mouth daily.  11/09/14  Yes Historical Provider, MD  dicyclomine (BENTYL) 10 MG capsule Take 10 mg by mouth 2 (two) times daily.   Yes Historical Provider, MD  doxazosin (CARDURA) 2 MG tablet Take 2 mg by mouth daily.   Yes Historical Provider, MD  epoetin alfa (EPOGEN,PROCRIT) 16606 UNIT/ML injection Inject 10,000 Units into the vein 3 (three) times a week.   Yes Historical Provider, MD  ferrous sulfate 325 (65 FE) MG tablet Take 325 mg by mouth daily with breakfast.   Yes Historical Provider, MD  gabapentin (NEURONTIN) 300 MG capsule  Take 300 mg by mouth 3 (three) times daily.   Yes Historical Provider, MD  hydrALAZINE (APRESOLINE) 100 MG tablet Take 100 mg by mouth 3 (three) times daily.   Yes Historical Provider, MD  hydrOXYzine (ATARAX/VISTARIL) 25 MG tablet Take 1 tablet by mouth every 6 (six) hours as needed for itching (itching).  09/21/14  Yes Historical Provider, MD  ipratropium-albuterol (DUONEB) 0.5-2.5 (3) MG/3ML SOLN Take 3 mLs by nebulization 3 (three) times daily.   Yes Historical Provider, MD  isosorbide mononitrate (IMDUR) 60 MG 24 hr tablet Take 60 mg by mouth daily.   Yes Historical Provider, MD  labetalol (NORMODYNE) 300 MG tablet Take 300 mg by mouth 2 (two) times daily.   Yes Historical Provider, MD  Melatonin 3 MG TABS Take 3 mg by mouth at bedtime.   Yes Historical Provider, MD  metoprolol tartrate (LOPRESSOR) 25 MG tablet Take 3 tablets (75 mg total) by mouth 2 (two) times daily. 05/14/14  Yes Orson Eva, MD  morphine (MS CONTIN) 30 MG 12 hr tablet Take 30 mg by mouth every 12 (twelve) hours.   Yes Historical Provider, MD  Multiple Vitamin (MULTIVITAMIN WITH MINERALS) TABS tablet Take 1 tablet by mouth daily.   Yes Historical Provider, MD  Oxycodone HCl 10 MG TABS Take 1 tablet (10 mg total) by mouth every 4 (four) hours as needed (moderate pain). 12/23/14  Yes Hosie Poisson, MD  Polyethyl Glycol-Propyl Glycol (SYSTANE) 0.4-0.3 % SOLN Place 1 drop into both eyes 2 (two) times daily.   Yes Historical Provider, MD  psyllium (HYDROCIL/METAMUCIL) 95 % PACK Take 1 packet by mouth daily.   Yes Historical Provider, MD  torsemide (DEMADEX) 20 MG tablet Take 20 mg by mouth daily.   Yes Historical Provider, MD  vitamin C (ASCORBIC ACID) 500 MG tablet Take 500 mg by mouth 2 (two) times daily.   Yes Historical Provider, MD  levofloxacin (LEVAQUIN) 500 MG tablet Take 1 tablet (500 mg total) by mouth daily. Patient not taking: Reported on 02/21/2016 12/23/14   Hosie Poisson, MD  saccharomyces boulardii (FLORASTOR) 250 MG  capsule Take 1 capsule (250 mg total) by mouth 2 (two) times daily. Patient not  taking: Reported on 02/22/2016 09/09/14   Nat Math, MD  senna (SENOKOT) 8.6 MG TABS tablet Take 1 tablet (8.6 mg total) by mouth 2 (two) times daily. Patient not taking: Reported on 02/04/2016 04/09/14   Melton Alar, PA-C  zolpidem (AMBIEN) 5 MG tablet Take 1 tablet (5 mg total) by mouth at bedtime as needed for sleep. Patient not taking: Reported on 02/01/2016 12/23/14   Hosie Poisson, MD    Physical Exam: Filed Vitals:   02/17/2016 0645 02/01/2016 0700 01/29/2016 0730 02/04/2016 0822  BP: 100/51 104/59 106/49 94/53  Pulse: 79 78 81 80  Temp:    98.4 F (36.9 C)  TempSrc:    Oral  Resp: 7 8 10 16   Height:    5\' 6"  (1.676 m)  Weight:    154.1 kg (339 lb 11.7 oz)  SpO2: 98% 93% 100% 98%    Constitutional:  NAD, calm, comfortable Filed Vitals:   02/01/2016 0645 02/10/2016 0700 02/23/2016 0730 02/20/2016 0822  BP: 100/51 104/59 106/49 94/53  Pulse: 79 78 81 80  Temp:    98.4 F (36.9 C)  TempSrc:    Oral  Resp: 7 8 10 16   Height:    5\' 6"  (1.676 m)  Weight:    154.1 kg (339 lb 11.7 oz)  SpO2: 98% 93% 100% 98%   Eyes: PERRL, lids and conjunctivae normal ENMT: Mucous membranes dry but mouth breathing . Tongue with some black coating  .  Neck: normal, supple, no masses Respiratory:  breathing slightly labored . No obvious wheezing . Poor participation in pulmonary exam   Cardiovascular: Regular rate and rhythm, no murmurs / rubs / gallops.  2 plus bilateral lower extremity edema. 2+ pedal pulses.  Abdomen:  morbidly obese , she appears to be somewhat diffusely tender . Bowel sounds positive. GU: Foley in place with clear yellow urine  Musculoskeletal: no clubbing / cyanosis. No joint deformity upper and lower extremities.  no contractures.   Skin: no rashes, lesions, ulcers.  Neurologic: Patient nearly obtunded with enough prompting she does make an effort to follow some commands however. Marland Kitchen  Psychiatric: Cannot  assess at this time   Labs on Admission: I have personally reviewed following labs and imaging studies   Urine analysis:    Component Value Date/Time   COLORURINE YELLOW 01/30/2016 0520   APPEARANCEUR CLOUDY* 02/01/2016 0520   LABSPEC 1.025 01/29/2016 0520   PHURINE 5.5 01/26/2016 0520   GLUCOSEU NEGATIVE 02/15/2016 0520   HGBUR LARGE* 02/06/2016 0520   BILIRUBINUR SMALL* 01/27/2016 0520   KETONESUR NEGATIVE 02/09/2016 0520   PROTEINUR >300* 02/15/2016 0520   UROBILINOGEN 0.2 12/15/2014 2126   NITRITE POSITIVE* 02/11/2016 0520   LEUKOCYTESUR MODERATE* 02/02/2016 0520    Radiological Exams on Admission: Ct Head Wo Contrast  02/16/2016  CLINICAL DATA:  Altered mental status and shortness of breath. EXAM: CT HEAD WITHOUT CONTRAST TECHNIQUE: Contiguous axial images were obtained from the base of the skull through the vertex without intravenous contrast. COMPARISON:  01/28/2016 FINDINGS: Mild cerebral atrophy. Mild ventricular dilatation consistent with central atrophy. Patchy low-attenuation changes suggesting small vessel ischemia. No mass effect or midline shift. No abnormal extra-axial fluid collections. Gray-white matter junctions are distinct. Basal cisterns are not effaced. No evidence of acute intracranial hemorrhage. No depressed skull fractures. Visualized paranasal sinuses and mastoid air cells are not opacified. Vascular calcifications. IMPRESSION: No acute intracranial abnormalities. Mild chronic atrophy and small vessel ischemic changes. Electronically Signed   By: Oren Beckmann.D.  On: 02/17/2016 06:37   Dg Chest Port 1 View  02/11/2016  CLINICAL DATA:  Altered mental status. EXAM: PORTABLE CHEST 1 VIEW COMPARISON:  02/04/2016 FINDINGS: Shallow inspiration. Cardiac enlargement with pulmonary vascular congestion. Hazy airspace disease throughout both lungs probably representing edema. No focal consolidation. No blunting of costophrenic angles. No pneumothorax. IMPRESSION:  Shallow inspiration. Cardiac enlargement with pulmonary vascular congestion and probable bilateral pulmonary edema. Electronically Signed   By: Lucienne Capers M.D.   On: 01/31/2016 05:37   Assessment/Plan   Active Problems:   Essential hypertension   Chronic diastolic heart failure (HCC)   Chronic kidney disease, stage 3   Pulmonary vascular congestion   UTI (lower urinary tract infection)   Acute respiratory failure with hypoxia (HCC)   AKI (acute kidney injury) ([place])      Acute respiratory failure with hypoxia. CXR concerning for pulmonary edema which is most likely secondary to uremia. Patient does have known chronic diastolic heart failure as well.  O2 saturation upper eighties prior to arrival, now normal on 02 nasal cannula.-admit to -admit to Stepdown  -baseline ABG -cpap / bipap    AKI superimposed on CKD stage III .    Creatinine 6.1 today. Patient was apparently hospitalized couple weeks ago and [place], almost needed emergent dialysis but numbers improved. I do not know her most recent creatinine but in early May it was 1.96      -Nephrology consult placed -Liter of fluid given in ED -Avoid nephrotoxic medications -Renal ultrasound -fraction urine sodium  Hx of grade 1 diastolic heart failure with preserved EF of 55-60% on echo March 2016.  CXR today suspicious for pulmonary edema.   -BNP -echocardiogram -Daily weights, daily intake and output  Encephalopathy, likely secondary to AKI, infection (UTI).  -urine / blood cx pending.  -continue Vanco and Zosyn  UTI.  -lives in facility, need to treat as healthcare acquired.  -culture urine -Vanco, Zosyn per pharmacy. Narrow coverage when culture results available  DM2- diet controlled as I don't see any diabetic medication on home list -Obtain A1c -CBGs, sliding scale insulin  Hyperkalemia, borderline. Still at upper end of normal upon repeat.   .  HTN, currently controlled   . -Cannot take by mouth meds given  obtunded state -IV hydralazine as neeed -If has rebound tachycardia of beta blocker give toprol 5mg  IV prn sustained HR > 100  Hx of DVT / PE, s/p IVC  Morbid obesity .  DVT prophylaxis:   SQ Heparin Code Status:   Full code Family Communication:  None. EDP spoke with family member.   Disposition Plan:  Discharge nursing home in 3-4 days             Consults called:    Nephrology  Admission status:   Admission - stepdown    Tye Savoy NP [facility] Pager [phone]  If 7PM-7AM, please contact night-coverage [email or web address] Password [email or web address]  01/30/2016, 8:50 AM

## 2016-02-12 NOTE — Progress Notes (Addendum)
Interdisciplinary Goals of Care Family Meeting    Date carried out:: 01/30/2016  Location of the meeting: Conference room  Member's involved: Physician, Bedside Registered Nurse and Family Member or next of kin  Durable Power of Attorney or acting medical decision maker: present Tax inspector: her nephew)    Discussion: We discussed goals of care for [employer] .  They do not want her to suffer. Acknowledge lots of pain. Want pain to be highest priority  Code status: Full DNR  Disposition: In-patient comfort care But will cont lasix and current abx Start morphine gtt  Time spent for the meeting: 30 minutes  Clementeen Graham 02/24/2016 5:43 PM  Erick Colace ACNP-BC [place] Pager # [phone] OR # [phone] if no answer  Full comfort care, transfer care back to [facility], PCCM signing off.  Rush Farmer, M.D. [facility]. Pager: [phone]. After hours pager: [phone].

## 2016-02-12 NOTE — Progress Notes (Signed)
CRITICAL VALUE ALERT  Critical value received:  ABG results at 1120  Date of notification:  02/09/2016    Time of notification:  [id number]  Critical value read back:Yes.    Nurse who received alert:  Marni Griffon, NP  MD notified (1st page):  Marni Griffon, NP  Time of first page:  1123  MD notified (2nd page):  Time of second page:  Responding MD:  Marni Griffon, NP   Time MD responded:  [phone]

## 2016-02-12 NOTE — Progress Notes (Signed)
Notified MD Langeland with [facility] on 2 attempts in regards to patient's AMS and abnormal results on patient's ABG. MD Janne Napoleon will consult CCM.

## 2016-02-12 NOTE — Progress Notes (Signed)
Patient has orders for Bipap. RRT called to help transfer patient to stepdown

## 2016-02-12 NOTE — ED Notes (Signed)
Brought via EMS from [facility].  Was sent their on the 14th from [facility].  Pt was originally supposed to have emergent dialysis prior to going to NH however labs improved and she did not need it. Per staff at facility pt is usually alert and oriented.  When the nurse came on at 2300 last night she found her to be altered.  Took several hours to get a physician order to come to hospital.  Breathing noted to be labored at this time.  CBG-123.  Was found to have sat of 85% on RA.  Up to 94% with O2 at 2L.

## 2016-02-12 NOTE — Progress Notes (Signed)
EDP had spoken with patient's nephew, Kelly [name] early this morning and I have just called him back with a condition update. Mr. Kelly [name] and patient's niece, Kelly [name] will be making medical decisions for patient at this point. We discussed code status. Mr. Kelly [name] would like patient to remain a full code but understands that code status may need soon need to be revisited pending clinical course.

## 2016-02-12 NOTE — Progress Notes (Signed)
Pharmacy Antibiotic Note  Kelly [name] is a 70 y.o. female admitted on 02/05/2016 with AMS and hypoxia. Pharmacy has been consulted for Vanc/Zosyn dosing for possible UTI. Patient resides in a nursing home. Recently hospitalized at [facility]. Pt presents with SCr elevated to 6.1. Previous SCr was 1.67-1.96 in April and May of this year. CrCl estimated to be 13.5 ml/min.  Patient received vancomycin 2 g load this AM at 0645 and zosyn this AM at 0600. WBC wnl, afebrile, lactic acid wnl  Patient has documented allergy to penicillins with reaction of hives. Patient received zosyn with no reported issues. Verified with nurse - Will continue to watch for allergic reaction.  Plan: Vancomycin 2 g IV q48h Zosyn 2.25 g IV q8h Monitor renal function, cultures, VT prn, LOT  Height: 5\' 6"  (167.6 cm) Weight: (!) 339 lb 11.7 oz (154.1 kg) IBW/kg (Calculated) : 59.3  Temp (24hrs), Avg:98.6 F (37 C), Min:98.4 F (36.9 C), Max:98.8 F (37.1 C)   Recent Labs Lab 02/14/2016 0538 02/20/2016 0549 02/17/2016 0550  WBC 8.9  --   --   CREATININE 5.33* 6.10*  --   LATICACIDVEN  --   --  0.60    Estimated Creatinine Clearance: 13.5 mL/min (by C-G formula based on Cr of 6.1).    Allergies  Allergen Reactions  . Sulfamethoxazole-Trimethoprim Shortness Of Breath and Other (See Comments)    Hyperkalemia (July 2015) Includes all Sulfa Related Antibiotics  . Bactrim [Sulfamethoxazole-Trimethoprim] Other (See Comments)    Hyperkalemia (July 2015)  . Penicillins Hives    Has taken cephalexin 06/2014    Antimicrobials this admission: Vanc 6/18 >>  Zosyn 6/18 >>   Dose adjustments this admission: none  Microbiology results: 6/18 BCx:  6/18 UCx:    Thank you for allowing pharmacy to be a part of this patient's care.  Joya San, PharmD Clinical Pharmacy Resident Pager # [phone] 01/28/2016 9:06 AM

## 2016-02-12 NOTE — Progress Notes (Signed)
Called by RN for new admit patient needing BIPAP and SDU bed.  On my arrival to patients room, RT at bedside obtaining ABG.  Patient is very lethargic, on 3 lpm nasal cannula, minimum air movement noted.  ABG results called to me ph 7.11, Co2 64.5, O2 74.4, HCO3 19.9-results called to MD.  Patient to be placed on BIPAP and  Transfer.  RN to call if assistance needed

## 2016-02-13 DIAGNOSIS — I10 Essential (primary) hypertension: Secondary | ICD-10-CM

## 2016-02-13 DIAGNOSIS — Z515 Encounter for palliative care: Secondary | ICD-10-CM

## 2016-02-13 DIAGNOSIS — Z66 Do not resuscitate: Secondary | ICD-10-CM

## 2016-02-13 LAB — URINE CULTURE: CULTURE: NO GROWTH

## 2016-02-13 LAB — HEMOGLOBIN A1C
Hgb A1c MFr Bld: 6.9 % — ABNORMAL HIGH (ref 4.8–5.6)
Mean Plasma Glucose: 151 mg/dL

## 2016-02-13 MED ORDER — MORPHINE SULFATE (PF) 2 MG/ML IV SOLN
2.0000 mg | INTRAVENOUS | Status: DC | PRN
  Administered 2016-02-13: 2 mg via INTRAVENOUS

## 2016-02-13 MED ORDER — WHITE PETROLATUM GEL
Status: AC
  Filled 2016-02-13: qty 1

## 2016-02-13 MED ORDER — GLYCOPYRROLATE 0.2 MG/ML IJ SOLN
0.2000 mg | INTRAMUSCULAR | Status: DC | PRN
  Administered 2016-02-13: 0.2 mg via INTRAVENOUS
  Filled 2016-02-13: qty 1

## 2016-02-17 LAB — CULTURE, BLOOD (ROUTINE X 2)
CULTURE: NO GROWTH
Culture: NO GROWTH

## 2016-02-25 NOTE — Care Management Important Message (Signed)
Important Message  Patient Details  Name: Kelly [name] MRN: [id number] Date of Birth: 01-11-1946   Medicare Important Message Given:  Yes    Loann Quill Feb 16, 2016, 10:33 AM

## 2016-02-25 NOTE — Progress Notes (Signed)
PROGRESS NOTE    Kelly [name]  [id number] DOB: 06-Jul-1946 DOA: 02/22/2016 PCP: Alvester Chou, NP   Outpatient Specialists:    Brief Narrative:  Admitted w/ acute hypercarbic encephalopathy, recurrent acute on chronic hypoxic and hypercarbic respiratory failure & acute on chronic renal failure.  Was seen by critical care and made comfort care.     Assessment & Plan:   Active Problems:   Essential hypertension   Chronic diastolic heart failure (HCC)   Chronic kidney disease, stage 3   Pulmonary vascular congestion   UTI (lower urinary tract infection)   Acute respiratory failure with hypoxia (HCC)   AKI (acute kidney injury) ([place])   Now comfort care- IV morphine infusion and PRNs, increase robinul for secretions, IV ativan Patient with periods of apnea so suspect in-hospital death   DVT prophylaxis:  Comfort care  Code Status: DNR   Family Communication: Nephew at bedside  Disposition Plan:  In hospital death   Consultants:     Procedures:      Subjective: Appears to be having periods of apnea  Objective: Filed Vitals:   01/29/2016 2100 02/01/2016 2200 02/22/2016 2300 02/16/16 0626  BP: 155/64 127/61 108/59 126/58  Pulse: 73 77 71 89  Temp:    97.7 F (36.5 C)  TempSrc:    Oral  Resp: 11 9 10    Height:      Weight:      SpO2: 88% 96% 92% 88%    Intake/Output Summary (Last 24 hours) at 02/16/2016 1129 Last data filed at 02/16/16 0335  Gross per 24 hour  Intake 1689.27 ml  Output      0 ml  Net 1689.27 ml   Filed Weights   01/31/2016 0503 01/31/2016 0822  Weight: 159.213 kg (351 lb) 154.1 kg (339 lb 11.7 oz)    Examination:  General exam: eyes closed-- secretions Pauses in breathing, coarse breath sounds   Data Reviewed: I have personally reviewed following labs and imaging studies  CBC:  Recent Labs Lab 02/18/2016 0538 01/31/2016 0549  WBC 8.9  --   NEUTROABS 6.5  --   HGB 9.0* 11.2*  HCT 30.7* 33.0*  MCV 91.6  --   PLT 271  --      Basic Metabolic Panel:  Recent Labs Lab 02/04/2016 0538 02/18/2016 0549  NA 133* 134*  K 5.2* 5.1  CL 101 104  CO2 20*  --   GLUCOSE 133* 132*  BUN 59* 59*  CREATININE 5.33* 6.10*  CALCIUM 8.4*  --    GFR: Estimated Creatinine Clearance: 13.5 mL/min (by C-G formula based on Cr of 6.1). Liver Function Tests:  Recent Labs Lab 02/17/2016 0538  AST 19  ALT 22  ALKPHOS 83  BILITOT 0.6  PROT 7.8  ALBUMIN 2.7*   No results for input(s): LIPASE, AMYLASE in the last 168 hours.  Recent Labs Lab 02/06/2016 0538  AMMONIA 32   Coagulation Profile: No results for input(s): INR, PROTIME in the last 168 hours. Cardiac Enzymes: No results for input(s): CKTOTAL, CKMB, CKMBINDEX, TROPONINI in the last 168 hours. BNP (last 3 results) No results for input(s): PROBNP in the last 8760 hours. HbA1C: No results for input(s): HGBA1C in the last 72 hours. CBG:  Recent Labs Lab 01/29/2016 0537 02/15/2016 0820 02/06/2016 1053 01/29/2016 1646 02/22/2016 2152  GLUCAP 130* 149* 152* 151* 122*   Lipid Profile: No results for input(s): CHOL, HDL, LDLCALC, TRIG, CHOLHDL, LDLDIRECT in the last 72 hours. Thyroid Function Tests: No results for  input(s): TSH, T4TOTAL, FREET4, T3FREE, THYROIDAB in the last 72 hours. Anemia Panel: No results for input(s): VITAMINB12, FOLATE, FERRITIN, TIBC, IRON, RETICCTPCT in the last 72 hours. Urine analysis:    Component Value Date/Time   COLORURINE YELLOW 02/08/2016 0520   APPEARANCEUR CLOUDY* 02/01/2016 0520   LABSPEC 1.025 02/04/2016 0520   PHURINE 5.5 02/08/2016 0520   GLUCOSEU NEGATIVE 02/19/2016 0520   HGBUR LARGE* 01/31/2016 0520   BILIRUBINUR SMALL* 02/24/2016 0520   KETONESUR NEGATIVE 02/24/2016 0520   PROTEINUR >300* 02/09/2016 0520   UROBILINOGEN 0.2 12/15/2014 2126   NITRITE POSITIVE* 02/16/2016 0520   LEUKOCYTESUR MODERATE* 02/08/2016 0520     ) Recent Results (from the past 240 hour(s))  Urine culture     Status: None   Collection Time:  02/24/2016  5:25 AM  Result Value Ref Range Status   Specimen Description URINE, CATHETERIZED  Final   Special Requests NONE  Final   Culture NO GROWTH  Final   Report Status Feb 17, 2016 FINAL  Final  MRSA PCR Screening     Status: None   Collection Time: 02/17/2016 10:58 AM  Result Value Ref Range Status   MRSA by PCR NEGATIVE NEGATIVE Final    Comment:        The GeneXpert MRSA Assay (FDA approved for NASAL specimens only), is one component of a comprehensive MRSA colonization surveillance program. It is not intended to diagnose MRSA infection nor to guide or monitor treatment for MRSA infections.       Anti-infectives    Start     Dose/Rate Route Frequency Ordered Stop   02/14/16 0630  vancomycin (VANCOCIN) 2,000 mg in sodium chloride 0.9 % 500 mL IVPB  Status:  Discontinued     2,000 mg 250 mL/hr over 120 Minutes Intravenous Every 48 hours 02/15/2016 0916 02/03/2016 2331   02/14/16 0600  vancomycin (VANCOCIN) 2,000 mg in sodium chloride 0.9 % 500 mL IVPB  Status:  Discontinued     2,000 mg 250 mL/hr over 120 Minutes Intravenous Every 48 hours 01/29/2016 2337 2016/02/17 1126   02-17-2016 0600  piperacillin-tazobactam (ZOSYN) IVPB 2.25 g  Status:  Discontinued     2.25 g 100 mL/hr over 30 Minutes Intravenous Every 8 hours 02/16/2016 2338 2016/02/17 1126   02/15/2016 1400  piperacillin-tazobactam (ZOSYN) IVPB 2.25 g  Status:  Discontinued     2.25 g 100 mL/hr over 30 Minutes Intravenous Every 8 hours 01/29/2016 0916 02/11/2016 2331   02/09/2016 0600  vancomycin (VANCOCIN) 2,000 mg in sodium chloride 0.9 % 500 mL IVPB     2,000 mg 250 mL/hr over 120 Minutes Intravenous  Once 02/18/2016 0547 02/04/2016 0842   02/09/2016 0600  piperacillin-tazobactam (ZOSYN) IVPB 3.375 g     3.375 g 100 mL/hr over 30 Minutes Intravenous  Once 02/19/2016 0547 02/06/2016 0624       Radiology Studies: Ct Head Wo Contrast  02/05/2016  CLINICAL DATA:  Altered mental status and shortness of breath. EXAM: CT HEAD WITHOUT  CONTRAST TECHNIQUE: Contiguous axial images were obtained from the base of the skull through the vertex without intravenous contrast. COMPARISON:  01/28/2016 FINDINGS: Mild cerebral atrophy. Mild ventricular dilatation consistent with central atrophy. Patchy low-attenuation changes suggesting small vessel ischemia. No mass effect or midline shift. No abnormal extra-axial fluid collections. Gray-white matter junctions are distinct. Basal cisterns are not effaced. No evidence of acute intracranial hemorrhage. No depressed skull fractures. Visualized paranasal sinuses and mastoid air cells are not opacified. Vascular calcifications. IMPRESSION: No acute intracranial abnormalities.  Mild chronic atrophy and small vessel ischemic changes. Electronically Signed   By: Lucienne Capers M.D.   On: 01/31/2016 06:37   Dg Chest Port 1 View  02/04/2016  CLINICAL DATA:  Altered mental status. EXAM: PORTABLE CHEST 1 VIEW COMPARISON:  02/04/2016 FINDINGS: Shallow inspiration. Cardiac enlargement with pulmonary vascular congestion. Hazy airspace disease throughout both lungs probably representing edema. No focal consolidation. No blunting of costophrenic angles. No pneumothorax. IMPRESSION: Shallow inspiration. Cardiac enlargement with pulmonary vascular congestion and probable bilateral pulmonary edema. Electronically Signed   By: Lucienne Capers M.D.   On: 02/18/2016 05:37        Scheduled Meds: . sodium chloride flush  3 mL Intravenous Q12H  . white petrolatum       Continuous Infusions: . sodium chloride 125 mL/hr at 02/01/2016 1625  . morphine 8 mg/hr (March 06, 2016 0335)     LOS: 1 day    Time spent: 25 min    [place], DO [facility] Pager [phone]  If 7PM-7AM, please contact night-coverage [email or web address] Password [facility] 2016-03-06, 11:29 AM

## 2016-02-25 NOTE — Discharge Summary (Signed)
Physician Death Summary  Kelly [name] [id number] DOB: [birth year] DOA: 02/03/2016  PCP: Alvester Chou, NP  Admit date: 02/08/2016 Discharge date: 2016-02-22   Patient was pronounced 02/22/2016 at 1:35 PM   Problem list   Active Problems:   Essential hypertension   Chronic diastolic heart failure (HCC)   Chronic kidney disease, stage 3   Pulmonary vascular congestion   UTI (lower urinary tract infection)   Acute respiratory failure with hypoxia (HCC)   AKI (acute kidney injury) ([place])    History of Present Illness:   Kelly [name] is a 70 y.o. female with multiple medical problems not limited to morbid obesity, hypertension, diabetes, chronic heart failure, PE/DVT, chronic kidney disease. Per EDP notes, patient was apparently recently hospitalized at [facility] where she almost needed emergent dialysis. Patient transferred here from NH this am for altered mental status and hypoxia with O2 saturation of 85% on room air. No history can be obtained from the patient she is nearly obtunded. No acute findings on head CT scan. Patient has had a significant decline in her renal function with creatinine up to 6 now   Hospital Course by Problem:   Acute on Chronic Hypoxic and hypercarbic respiratory failure due to decompensated diastolic HF and pulmonary edema in setting OSA/OHS and probably decompensated Cor Pulmonale. This is also further complicated by her acute on chronic renal failure.  ->this is not a problem that we can fix long term.  ->not a candidate for HD ->family says she would not want invasive interventions such as long term HD or vent trach - transition to comfort.     Medical Consultants:    PCCM   Discharge Exam:   Filed Vitals:   02/23/2016 2300 02/22/16 0626  BP: 108/59 126/58  Pulse: 71 89  Temp:  97.7 F (36.5 C)  Resp: 10    Filed Vitals:   02/14/2016 2200 01/30/2016 2300 02/22/2016 0626 22-Feb-2016 1300  BP: 127/61 108/59 126/58   Pulse: 77 71  89   Temp:   97.7 F (36.5 C)   TempSrc:   Oral   Resp: 9 10    Height:      Weight:    159.2 kg (350 lb 15.6 oz)  SpO2: 96% 92% 88%       The results of significant diagnostics from this hospitalization (including imaging, microbiology, ancillary and laboratory) are listed below for reference.     Procedures and Diagnostic Studies:   Ct Head Wo Contrast  01/29/2016  CLINICAL DATA:  Altered mental status and shortness of breath. EXAM: CT HEAD WITHOUT CONTRAST TECHNIQUE: Contiguous axial images were obtained from the base of the skull through the vertex without intravenous contrast. COMPARISON:  01/28/2016 FINDINGS: Mild cerebral atrophy. Mild ventricular dilatation consistent with central atrophy. Patchy low-attenuation changes suggesting small vessel ischemia. No mass effect or midline shift. No abnormal extra-axial fluid collections. Gray-white matter junctions are distinct. Basal cisterns are not effaced. No evidence of acute intracranial hemorrhage. No depressed skull fractures. Visualized paranasal sinuses and mastoid air cells are not opacified. Vascular calcifications. IMPRESSION: No acute intracranial abnormalities. Mild chronic atrophy and small vessel ischemic changes. Electronically Signed   By: Lucienne Capers M.D.   On: 02/01/2016 06:37   Dg Chest Port 1 View  02/20/2016  CLINICAL DATA:  Altered mental status. EXAM: PORTABLE CHEST 1 VIEW COMPARISON:  02/04/2016 FINDINGS: Shallow inspiration. Cardiac enlargement with pulmonary vascular congestion. Hazy airspace disease throughout both lungs probably representing edema. No  focal consolidation. No blunting of costophrenic angles. No pneumothorax. IMPRESSION: Shallow inspiration. Cardiac enlargement with pulmonary vascular congestion and probable bilateral pulmonary edema. Electronically Signed   By: Lucienne Capers M.D.   On: 02/01/2016 05:37     Labs:   Basic Metabolic Panel:  Recent Labs Lab 02/20/2016 0538 01/29/2016 0549    NA 133* 134*  K 5.2* 5.1  CL 101 104  CO2 20*  --   GLUCOSE 133* 132*  BUN 59* 59*  CREATININE 5.33* 6.10*  CALCIUM 8.4*  --    GFR Estimated Creatinine Clearance: 13.8 mL/min (by C-G formula based on Cr of 6.1). Liver Function Tests:  Recent Labs Lab 02/21/2016 0538  AST 19  ALT 22  ALKPHOS 83  BILITOT 0.6  PROT 7.8  ALBUMIN 2.7*   No results for input(s): LIPASE, AMYLASE in the last 168 hours.  Recent Labs Lab 02/14/2016 0538  AMMONIA 32   Coagulation profile No results for input(s): INR, PROTIME in the last 168 hours.  CBC:  Recent Labs Lab 02/11/2016 0538 02/11/2016 0549  WBC 8.9  --   NEUTROABS 6.5  --   HGB 9.0* 11.2*  HCT 30.7* 33.0*  MCV 91.6  --   PLT 271  --    Cardiac Enzymes: No results for input(s): CKTOTAL, CKMB, CKMBINDEX, TROPONINI in the last 168 hours. BNP: Invalid input(s): POCBNP CBG:  Recent Labs Lab 02/01/2016 0537 01/29/2016 0820 01/28/2016 1053 02/01/2016 1646 01/30/2016 2152  GLUCAP 130* 149* 152* 151* 122*   D-Dimer No results for input(s): DDIMER in the last 72 hours. Hgb A1c  Recent Labs  02/19/2016 0857  HGBA1C 6.9*   Lipid Profile No results for input(s): CHOL, HDL, LDLCALC, TRIG, CHOLHDL, LDLDIRECT in the last 72 hours. Thyroid function studies No results for input(s): TSH, T4TOTAL, T3FREE, THYROIDAB in the last 72 hours.  Invalid input(s): FREET3 Anemia work up No results for input(s): VITAMINB12, FOLATE, FERRITIN, TIBC, IRON, RETICCTPCT in the last 72 hours. Microbiology Recent Results (from the past 240 hour(s))  Urine culture     Status: None   Collection Time: 01/27/2016  5:25 AM  Result Value Ref Range Status   Specimen Description URINE, CATHETERIZED  Final   Special Requests NONE  Final   Culture NO GROWTH  Final   Report Status 02/20/16 FINAL  Final  MRSA PCR Screening     Status: None   Collection Time: 02/12/16 10:58 AM  Result Value Ref Range Status   MRSA by PCR NEGATIVE NEGATIVE Final    Comment:         The GeneXpert MRSA Assay (FDA approved for NASAL specimens only), is one component of a comprehensive MRSA colonization surveillance program. It is not intended to diagnose MRSA infection nor to guide or monitor treatment for MRSA infections.        Time coordinating discharge: 35 min  Signed:  Jerusalem Wert U Lenix Benoist   [facility] 2016-02-20, 4:10 PM

## 2016-02-25 NOTE — Progress Notes (Signed)
122ml of IV morphine wasted, Witnessed by  Karren Burly

## 2016-02-25 NOTE — Progress Notes (Signed)
Witnessed 124ml of IV morphine with Ludwig Clarks, RN

## 2016-02-25 DEATH — deceased
